# Patient Record
Sex: Female | Born: 1940 | Race: White | Hispanic: No | Marital: Single | Smoking: Never smoker
Health system: Southern US, Community
[De-identification: ages and names within clinical notes are randomized; demographics above are authoritative.]

## PROBLEM LIST (undated history)

## (undated) DIAGNOSIS — F32A Depression, unspecified: Secondary | ICD-10-CM

## (undated) DIAGNOSIS — J189 Pneumonia, unspecified organism: Secondary | ICD-10-CM

## (undated) DIAGNOSIS — K2270 Barrett's esophagus without dysplasia: Secondary | ICD-10-CM

## (undated) DIAGNOSIS — R112 Nausea with vomiting, unspecified: Secondary | ICD-10-CM

## (undated) DIAGNOSIS — D649 Anemia, unspecified: Secondary | ICD-10-CM

## (undated) DIAGNOSIS — Z9889 Other specified postprocedural states: Secondary | ICD-10-CM

## (undated) DIAGNOSIS — I10 Essential (primary) hypertension: Secondary | ICD-10-CM

## (undated) DIAGNOSIS — F329 Major depressive disorder, single episode, unspecified: Secondary | ICD-10-CM

## (undated) DIAGNOSIS — K219 Gastro-esophageal reflux disease without esophagitis: Secondary | ICD-10-CM

## (undated) DIAGNOSIS — Z8719 Personal history of other diseases of the digestive system: Secondary | ICD-10-CM

## (undated) DIAGNOSIS — A419 Sepsis, unspecified organism: Secondary | ICD-10-CM

## (undated) DIAGNOSIS — F419 Anxiety disorder, unspecified: Secondary | ICD-10-CM

## (undated) DIAGNOSIS — E669 Obesity, unspecified: Secondary | ICD-10-CM

## (undated) DIAGNOSIS — H3530 Unspecified macular degeneration: Secondary | ICD-10-CM

## (undated) DIAGNOSIS — J302 Other seasonal allergic rhinitis: Secondary | ICD-10-CM

## (undated) DIAGNOSIS — R519 Headache, unspecified: Secondary | ICD-10-CM

## (undated) DIAGNOSIS — M1990 Unspecified osteoarthritis, unspecified site: Secondary | ICD-10-CM

## (undated) HISTORY — DX: Headache, unspecified: R51.9

## (undated) HISTORY — PX: SHOULDER ARTHROSCOPY: SHX128

## (undated) HISTORY — PX: TONSILLECTOMY: SUR1361

## (undated) HISTORY — PX: APPENDECTOMY: SHX54

## (undated) HISTORY — PX: ABDOMINAL SURGERY: SHX537

## (undated) HISTORY — DX: Unspecified macular degeneration: H35.30

## (undated) HISTORY — PX: PARAESOPHAGEAL HERNIA REPAIR: SHX2161

## (undated) HISTORY — PX: ABDOMINAL HYSTERECTOMY: SHX81

## (undated) HISTORY — PX: CERVICAL FUSION: SHX112

## (undated) HISTORY — PX: BACK SURGERY: SHX140

## (undated) HISTORY — PX: SKIN BIOPSY: SHX1

## (undated) HISTORY — PX: CHOLECYSTECTOMY: SHX55

## (undated) HISTORY — PX: HIATAL HERNIA REPAIR: SHX195

## (undated) HISTORY — DX: Sepsis, unspecified organism: A41.9

## (undated) HISTORY — PX: EYE SURGERY: SHX253

## (undated) HISTORY — PX: MEDIAL PARTIAL KNEE REPLACEMENT: SHX5965

---

## 1977-03-05 HISTORY — PX: GASTRIC BYPASS: SHX52

## 1993-03-05 HISTORY — PX: JOINT REPLACEMENT: SHX530

## 1999-06-21 ENCOUNTER — Ambulatory Visit (HOSPITAL_COMMUNITY): Admission: RE | Admit: 1999-06-21 | Discharge: 1999-06-21 | Payer: Self-pay | Admitting: Neurosurgery

## 1999-07-24 ENCOUNTER — Ambulatory Visit (HOSPITAL_COMMUNITY): Admission: RE | Admit: 1999-07-24 | Discharge: 1999-07-24 | Payer: Self-pay | Admitting: Neurosurgery

## 1999-08-07 ENCOUNTER — Ambulatory Visit (HOSPITAL_COMMUNITY): Admission: RE | Admit: 1999-08-07 | Discharge: 1999-08-07 | Payer: Self-pay | Admitting: Neurosurgery

## 1999-09-27 ENCOUNTER — Inpatient Hospital Stay (HOSPITAL_COMMUNITY): Admission: RE | Admit: 1999-09-27 | Discharge: 1999-09-30 | Payer: Self-pay | Admitting: Neurosurgery

## 2000-07-30 ENCOUNTER — Encounter: Payer: Self-pay | Admitting: Orthopedic Surgery

## 2000-08-05 ENCOUNTER — Inpatient Hospital Stay (HOSPITAL_COMMUNITY): Admission: RE | Admit: 2000-08-05 | Discharge: 2000-08-08 | Payer: Self-pay | Admitting: Orthopedic Surgery

## 2003-01-25 ENCOUNTER — Inpatient Hospital Stay (HOSPITAL_COMMUNITY): Admission: RE | Admit: 2003-01-25 | Discharge: 2003-01-29 | Payer: Self-pay | Admitting: Orthopedic Surgery

## 2003-11-02 ENCOUNTER — Inpatient Hospital Stay (HOSPITAL_BASED_OUTPATIENT_CLINIC_OR_DEPARTMENT_OTHER): Admission: RE | Admit: 2003-11-02 | Discharge: 2003-11-02 | Payer: Self-pay | Admitting: Cardiology

## 2006-03-05 HISTORY — PX: JOINT REPLACEMENT: SHX530

## 2007-06-02 ENCOUNTER — Inpatient Hospital Stay (HOSPITAL_COMMUNITY): Admission: RE | Admit: 2007-06-02 | Discharge: 2007-06-05 | Payer: Self-pay | Admitting: Orthopedic Surgery

## 2009-01-21 ENCOUNTER — Encounter: Admission: RE | Admit: 2009-01-21 | Discharge: 2009-01-21 | Payer: Self-pay | Admitting: Orthopedic Surgery

## 2009-05-16 ENCOUNTER — Encounter: Admission: RE | Admit: 2009-05-16 | Discharge: 2009-05-16 | Payer: Self-pay | Admitting: Neurosurgery

## 2009-11-01 ENCOUNTER — Ambulatory Visit (HOSPITAL_COMMUNITY): Admission: RE | Admit: 2009-11-01 | Discharge: 2009-11-01 | Payer: Self-pay | Admitting: Neurosurgery

## 2009-12-01 ENCOUNTER — Inpatient Hospital Stay (HOSPITAL_COMMUNITY)
Admission: RE | Admit: 2009-12-01 | Discharge: 2009-12-03 | Payer: Self-pay | Source: Home / Self Care | Admitting: Neurosurgery

## 2010-05-18 LAB — CBC
HCT: 43.5 % (ref 36.0–46.0)
Hemoglobin: 14.1 g/dL (ref 12.0–15.0)
MCH: 30.4 pg (ref 26.0–34.0)
MCHC: 32.4 g/dL (ref 30.0–36.0)
MCV: 93.8 fL (ref 78.0–100.0)
Platelets: 202 10*3/uL (ref 150–400)
RBC: 4.64 MIL/uL (ref 3.87–5.11)
RDW: 13.7 % (ref 11.5–15.5)
WBC: 7.9 10*3/uL (ref 4.0–10.5)

## 2010-05-18 LAB — BASIC METABOLIC PANEL
BUN: 21 mg/dL (ref 6–23)
CO2: 27 mEq/L (ref 19–32)
Calcium: 8.2 mg/dL — ABNORMAL LOW (ref 8.4–10.5)
Chloride: 100 mEq/L (ref 96–112)
Creatinine, Ser: 1.24 mg/dL — ABNORMAL HIGH (ref 0.4–1.2)
GFR calc Af Amer: 52 mL/min — ABNORMAL LOW (ref >60)
GFR calc non Af Amer: 43 mL/min — ABNORMAL LOW (ref >60)
Glucose, Bld: 103 mg/dL — ABNORMAL HIGH (ref 70–99)
Potassium: 3.3 mEq/L — ABNORMAL LOW (ref 3.5–5.1)
Sodium: 136 mEq/L (ref 135–145)

## 2010-05-18 LAB — SURGICAL PCR SCREEN
MRSA, PCR: NEGATIVE
Staphylococcus aureus: NEGATIVE

## 2010-07-18 NOTE — Op Note (Signed)
[name], [name] NO.:  [id number]   MEDICAL RECORD NO.:  [id number]          PATIENT TYPE:  INP   LOCATION:  5029                         FACILITY:  [facility]   PHYSICIAN:  Mila Homer. Sherlean Foot, M.D. DATE OF BIRTH:  10-Oct-1940   DATE OF PROCEDURE:  06/02/2007  DATE OF DISCHARGE:                               OPERATIVE REPORT   SURGEON:  Mila Homer. Sherlean Foot, M.D.   ASSISTANT:  Legrand Pitts. Duffy, P.A.   ANESTHESIA:  General.   PREOPERATIVE DIAGNOSIS:  Left hip osteoarthritis.   POSTOPERATIVE DIAGNOSIS:  Left hip osteoarthritis.   PROCEDURE:  Left total hip arthroplasty.   INDICATIONS FOR PROCEDURE:  Patient is a 70 year old white female with  failure of conservative measures for osteoarthritis of the left knee.   DESCRIPTION OF PROCEDURE:  Patient was laid supine, administered general  anesthesia, and placed in the right-down, left-up lateral decubitus  position.  The right hip was prepped and draped in the usual sterile  fashion.  A lateral incision was made with a #10 blade, and this was a  curvilinear incision.  Sharp dissection was continued down to the fascia  lata.  I then placed the trial limb retractor in place after incising  fascia lata.  The cautery was used for dissection and to obtain  hemostasis.  Once hemostasis was obtained, I then incised the anterior  one-half of the vastus lateralis, gluteus medius, and all of the gluteus  minimus.  I tagged these with #2 Tevdek.  I then performed an anterior  hip capsulectomy.  I placed the Hohmann retractor anteriorly and used  the neck guide to mark out a good femoral neck cut.  Once I had done  this, I switched to the back side of the table, PA to the front side.  I  removed the labrum circumferentially, and at this point, reamed up to 54  and put in a 56 mm fiber mesh cup with no holes or no spikes.  I placed  a 7 mm neutral off-set liner to accept a 32 mm head and then switched  back to the back side of the  table.  I then sequentially reamed up to 16  and then broached up to 16 and trialed with various head sizes, chose  5/16 .  I then located up, took it through an aggressive range of motion  and felt the hip was indeed stable.  I then removed the trial  components, copiously irrigated them, then press-fitted in a 5/16 porous-  coated stem, tamped on a clean +35 ball, and relocated the hip.  I then  irrigated, closed the vastus gluteus sleeve through drill holes and  oversewn with #2 Tevdeks.  I then closed the fascia lata with a running  #1 Vicryl, deep soft tissues with buried 0 Vicryls, subcuticular 2-0  Vicryl, and skin staples.   COMPLICATIONS:  None.   DRAINS:  None.           ______________________________  Mila Homer. Sherlean Foot, M.D.     SDL/MEDQ  D:  06/02/2007  T:  06/02/2007  Job:  [id number] 

## 2010-07-21 NOTE — Op Note (Signed)
[place]  Patient:    [name] [name], [name] [name]                        MRN: [id number] Proc. Date: 08/05/00 Adm. Date:  [id number] Attending:  Georgena Spurling                           Operative Report  PREOPERATIVE DIAGNOSIS:  Right hip osteoarthritis.  POSTOPERATIVE DIAGNOSIS:  Right hip osteoarthritis.  OPERATION PERFORMED:  Right total hip replacement.  SURGEON:  Georgena Spurling, M.D.  ASSISTANT:  Jamelle Rushing, P.A.  ANESTHESIA:  General.  INDICATIONS FOR PROCEDURE:  The patient is a 70 year old white female with radiographic and clinical findings of osteoarthritis of the right hip.  After informed consent was obtained, she was taken to the operating room.  DESCRIPTION OF PROCEDURE:  The patient was laid in the supine position and administered general endotracheal anesthesia and Foley catheter was placed. She was then placed in the right up, left down lateral decubitus position. The right hip was prepped and draped in the usual sterile fashion.  A #10 blade was used to make a curvilinear incision, Southern fashion.  Sharp dissection was continued down to the level of the fascia.  Bleeding vessels were cauterized.  The fascia was incised along the length of the incision and held in place with a Charnley retractor.  A complete bursectomy was performed. We then made an incision taking off the anterior one third of the gluteus medius, the gluteus minimus and a sleeve of vastus lateralis to keep it in continuity.  We then went into external rotation and began to elevate this off subperiosteally.  We then removed the anterior hip joint capsule.  We then dislocated the femur with external rotation and flexion.  We then used the cutting guide to choose our neck length and used a sagittal saw to make this cut.  We then brought the leg up onto the table ____________ I used a Hohmann posteriorly to keep the femur out of the way.  A cobra anteriorly to  remove the labrum circumferentially and then sequentially reamed up to 54 mm.  At this point we tamped in a trispiked 56 mm cuff with 2 mm of press-fit fixation.  This was very, very stable.  This was approximately in 35 degrees of inclination and 10 degrees of anteversion.  We then put the leg in flexion and off the table and we used the canal finding reamer and then sequentially reamed up to 15.5 mm and then broached up to a 16.  We then trialed with a 0 ball and felt it was very stable with its neutral liner.  We therefore removed the trial component, irrigated and placed in a real neutral polyethylene highly cross-linked polyethylene component.  We then tamped down to size 16 with 0.5 mm of press-fit fixation holding it in approximately 5 to 10 degrees of anteversion.  We then placed the 0 32 mm head, located the hip, took it through an extreme range of motion and felt it was very stable. We then left a Hemovac deep to the wound.  We closed the sleeve of the gluteus medius and vastus lateralis through drill hole into the lateral greater trochanter.  We then used several interrupted figure-of-eight sutures in that layer as well. We then left our Hemovac deep to the fascial layer and closed our fascia with a series of  interrupted #1 Vicryls.  Deep soft tissues with interrupted 0 Vicryl, a subcuticular 2-0 Vicryl and skin staples.  We dressed the wound with Adaptic, 4 x 4s, ABDs and a sterile Ioban drape.  ESTIMATED BLOOD LOSS:  500 cc.  COMPLICATIONS:  None.  DRAINS:  One Hemovac. DD:  08/05/00 TD:  08/05/00 Job: [id number]

## 2010-07-21 NOTE — H&P (Signed)
[place]. [facility]  Patient:    [name] [name], [name] [name]                        MRN: [id number] Adm. Date:  [id number] Disc. Date: [id number] Attending:  Tressie Stalker D                         History and Physical  CHIEF COMPLAINT:  Right hip and leg pain.  HISTORY OF PRESENT ILLNESS:  The patient is a 70 year old white female who has been having pain in her hip and right leg for approximately a year and one-half.  She has been seen by a chiropractor and treated with some success, but the pain persisted.  She was seen by Dr. Mila Palmer and was diagnosed with arthritis.  She failed to improve and was worked up with a hip CT and a lumbar MRI.  The hip CT was more or less normal.  The lumbar MRI demonstrates spinal stenosis.  The patient complains of right posterior thigh pain which radiates down into her lateral knee region.  She has some back pain but more hip and leg pain than back pain.  She has failed medical management.  PAST MEDICAL HISTORY:  Hypertension, varicose veins, obesity, knee osteoarthritis, hammertoe.  MEDICATIONS PRIOR TO ADMISSION: 1. Maxzide 1/2 p.o. q.d. 2. Premarin 0.625 mg p.o. q.d. 3. Ambient 10 mg p.o. q.d. 4. Darvocet p.r.n. 5. Tagamet 400 mg b.i.d. 6. Detrol 1/2 p.o. q.d. p.r.n.  DRUG ALLERGIES:   PENICILLIN, NOVOCAINE.  PAST SURGICAL HISTORY:  In 1979, vein stripping.  In 1980, stomach stapling. In 1987, hysterectomy.  In 1994, knee arthroscopy.  In 1992, hammertoe.  FAMILY MEDICAL HISTORY:  The patients mother died at age 34 secondary to breast cancer.  Her father died at age 59 secondary to heart problems and a stroke.  She had one brother with a myocardial infarction and another brother with colon cancer and high blood pressure.  SOCIAL HISTORY:  The patient is single.  She lives in [place].  She is a Production designer, theatre/television/film at [employer].  She denies tobacco, ethanol, or drug use.  REVIEW OF SYSTEMS:  Negative except as above.  PHYSICAL  EXAMINATION:  GENERAL:  A pleasant, obese, 70 year old white female complaining of right hip and leg pain.  HEENT:  Normocephalic, atraumatic.   Pupils are equal, round and reactive to light.  Extraocular muscles intact.  Sclerae white.  Conjunctivae pink. Oropharynx benign.  Uvula midline.  NECK:  Supple.  There is no masses, meningismus, deformity, tracheal deviation, jugular venous distension, or carotid bruits.  She has a limited cervical range of motion.  Spurlings test is negative.  Lhermittes sign was not present.  Thorax is symmetric.  LUNGS:  Clear to auscultation.  HEART:  Regular rate and rhythm.  ABDOMEN:  Obese, soft, nontender.  EXTREMITIES:  Obese.  There is no point tenderness or deformities to palpation of her right lower extremity.  BACK:  There is no point tenderness or deformities.  Straight leg raise testing and Faberes testing are equivocal on the right and negative on the left.  NEUROLOGIC:  The patient is alert and oriented x 3.  Cranial nerves II-XII are grossly intact.  Bilateral vision and hearing grossly normal.  Motor strength is 5/5 in bilateral deltoid, biceps, triceps, hand grips, wrist extensors, interosseous psoas, quadriceps, gastrocnemius, extensor hallucis longus. Cerebellar exam is intact to rapid alternating movements of the upper  extremities bilaterally.  Sensory exam is grossly normal to light touch in all tested dermatomes bilaterally.  Deep tendon reflexes are 1 to 2 in bilateral biceps, triceps, brachial radialis, quadriceps, gastrocnemius.  DIAGNOSTIC STUDIES:  The patient had an MRI performed at [facility] on April 19, 1999.  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  She has severe degenerative disk disease at L4-5 with multifactorial spinal stenosis, right greater than left, in the  lateral recesses.  ASSESSMENT/PLAN: L4-5 degenerative disease, spinal stenosis, and spondylosis.  I have discussed the situation with the patient and reviewed her MRI scan, pointing out the abnormalities.  She has failed medical management and, therefore, discussed a bilateral L4 laminotomy foraminotomy versus total laminectomy with her.  I have shown her surgical models and discussed the risks of surgery extensively.  The patient has weighed the risks, benefits, and alternatives to surgery and wishes to proceed with the surgery on September 27, 1999.  History of hypertension, varicose veins, osteoarthritis, etc., noted. DD:  09/27/99 TD:  09/27/99 Job: [id number]

## 2010-07-21 NOTE — Op Note (Signed)
NAME:  [name] [name], [name] [name]                           ACCOUNT NO.:  [id number]   MEDICAL RECORD NO.:  [id number]                   PATIENT TYPE:  INP   LOCATION:  5040                                 FACILITY:  [facility]   PHYSICIAN:  Mila Homer. Sherlean Foot, M.D.              DATE OF BIRTH:  [birth year]   DATE OF PROCEDURE:  01/25/2003  DATE OF DISCHARGE:                                 OPERATIVE REPORT   HISTORY OF PRESENT ILLNESS:  The patient is a 70 year old white female who  has failed conservative measures for osteoarthritis of the knee.  The left  knee was found to have severe osteoarthritis and informed consent was  obtained.   PREOPERATIVE DIAGNOSIS:  Left severe osteoarthritis of the knee.   POSTOPERATIVE DIAGNOSIS:  Left severe osteoarthritis of the knee.   OPERATION PERFORMED:  Left total knee arthroplasty.   SURGEON:  Mila Homer. Sherlean Foot, M.D.   ASSISTANT:  Jamelle Rushing, P.A.   ANESTHESIA:  General.   DESCRIPTION OF PROCEDURE:  The patient was taken to the operating room and  put in supine position and administered general endotracheal anesthesia.  After a sterile prep and drape, a #10 blade was used to make a standard  midline incision.  A new blade was used to make a median parapatellar  arthrotomy.  A synovectomy was performed in the suprapatellar pouch.  We had  a lot of difficulty in getting the patella everted.  I did create a small  pouch for it to be everted into and had to perform somewhat of a lateral  release just to get it to evert.  I then measured at 22 mm thick and used  the 29 reamer to ream down and recreate that thickness with a new prosthetic  trial in place after drilling three lug holes.  I then removed the trial and  went into flexion where I cut the ACL and PCL and delivered the tibia  anteriorly.  I used the extramedullary alignment system to remove a cut  surface of the tibia 2 mm of bone off the medial tibial plateau and  perpendicular to the long axis of  the tibia.  At this point I then turned  attention to the femur.  I made an intramedullary drill hole and used the  intramedullary alignment guide to set up our distal femoral cutting block  and made that distal femoral cut with a sagittal saw in 6 degrees of valgus.  At this point I placed a four in one cutting block on and made anterior,  posterior and chamfer cuts after measuring for a size E.  I then used a  finishing block and drilled with lug holes.  I then turned attention back to  the tibia where I used a size 4 finishing tray with a drill and a keel.  I  then trialed with a size 4 tibia size E femur size  10 poly insert and size  35 patella.  I had excellent patellar tracking, good flexion-extension gap  balancing, so I chose these components.  I then copiously irrigated and  cemented in the tibia first, femur second, snapped in real polyethylene and  located the knee, put it in extension and then cemented in the patella.  At  this point I irrigated copiously.  I then let the tourniquet down.  This was  at 59 minutes.  I then placed a Hemovac deep to the arthrotomy and a pain  catheter superficial to the arthrotomy.  I closed the arthrotomy with  interrupted #1 figure-of-eight Vicryl sutures, deep soft tissues with  interrupted 0 Vicryl sutures, subcuticular 2-0 Vicryl suture and skin  staples.  Dressed with Adaptic, 4 x 4s, sterile Webril and TED stockings.  Tourniquet 59 minutes.   COMPLICATIONS:  None.   DRAINS:  None.   ESTIMATED BLOOD LOSS:  300 mL.                                               Mila Homer. Sherlean Foot, M.D.    SDL/MEDQ  D:  01/25/2003  T:  01/26/2003  Job:  [id number]

## 2010-07-21 NOTE — Discharge Summary (Signed)
NAME:  [name] [name], [name] [name]                           ACCOUNT NO.:  [id number]   MEDICAL RECORD NO.:  [id number]                   PATIENT TYPE:  INP   LOCATION:  5040                                 FACILITY:  [facility]   PHYSICIAN:  Mila Homer. Sherlean Foot, M.D.              DATE OF BIRTH:  March 28, 1940   DATE OF ADMISSION:  01/25/2003  DATE OF DISCHARGE:  01/29/2003                                 DISCHARGE SUMMARY   ADMISSION DIAGNOSES:  1. Left knee osteoarthritis.  2. History of right total hip arthroplasty in 2002.  3. Hypertension.  4. Gastroesophageal reflux disease.  5. History of phlebitis.  6. History of anemia.   DISCHARGE DIAGNOSES:  1. End-stage osteoarthritis left knee status post left total knee     arthroplasty.  2. Acute blood loss anemia secondary to surgery.  3. Mild leukocytosis, now resolved.  4. Hypertension.  5. Gastroesophageal reflux disease.  6. History of right total hip arthroplasty 2002.  7. History of phlebitis.  8. History of anemia.   SURGICAL PROCEDURE:  On January 25, 2003 [name] [name] underwent a left total  knee arthroplasty by Dr. Mila Homer. Lucey, assisted by Oneida Alar, P.A.-C.  She had a NexGen Complete all poly patella standard size 35 mm diameter; 9  mm thickness with a NexGen Legacy knee LPS femoral component size E left; a  NexGen Legacy knee LPS articular surface size striped yellow EF 10 mm  height; and a  NexGen fluted stem tibial component size 4.   COMPLICATIONS:  None.   CONSULTS:  1. Physical therapy and rehab medicine consult January 26, 2003.  2. Occupational therapy consult January 27, 2003.  3. Case management consult January 29, 2003.   HISTORY OF PRESENT ILLNESS:  This 70 year old white female patient presented  to Dr. Sherlean Foot with a long history of left knee pain.  The pain is a dull  aching sensation without radiation.  It seems to be worse with damp and  cold, rainy conditions and she complains of locking and giving-way in her  knee.  She has failed conservative treatment and because of that she is  presenting for a left knee replacement.   HOSPITAL COURSE:  [name] [name] tolerated her surgical procedure well and  without immediate postoperative complications.  She was subsequently  transferred to 5000.  Postoperative day #1 Tmax 99.7, vital signs were  stable.  Hemoglobin 9.4, hematocrit 28.1.  Leg was neurovascularly intact.  She was started on therapy per protocol and Foley was discontinued later  that day.   Postoperative day #2 Tmax 101.2, vital signs still stable.  Hemoglobin 9.1,  hematocrit 27.3.  Left knee incision well approximated with staples.  She  was switched to p.o. pain medications, aggressive pulmonary toilet for her  fever, and she did complain of some calf pain at that time so bilateral  lower extremity Dopplers were ordered to rule out DVT.  On postoperative day #3 she was feeling better.  Pain was controlled with  medications.  White count was 12, hemoglobin 9.3, hematocrit 27.4.  Doppler  had been negative for DVT.  Temperature and white count were monitored and  she was kept one more day.  On November 26 she was afebrile, wound was  benign.  It was felt she was stable for discharge home and she was  discharged home later that day.   DISCHARGE INSTRUCTIONS:   DIET:  She may resume her prehospitalization diet.   MEDICATIONS:  She may resume her prehospitalization medications except no  Darvocet while taking other pain medications.  These include:  1. Trazodone one-half tablet at bedtime.  2. Zoloft 50 mg one-half tablet daily.  3. Maxzide one-half tablet p.o. daily.  4. Nexium 40 mg p.o. daily.  5. Multivitamin one p.o. q.h.s.   Additional medications include:  1. Lovenox 40 mg subcu daily with the last dose February 08, 2003.  2. OxyContin 10 mg two tablets p.o. q.12h. for seven days then one tablet     p.o. q.12h. for seven days then discontinue, #42 with no refill.  3. Percocet  5/325 mg one to two tablets p.o. q.4h. p.r.n. for pain, #50 with     no refill.   ACTIVITY:  She can be out of bed, weightbearing as tolerated on the left leg  with use of the walker.  Please see the blue total knee discharge sheet for  further discharge instructions.   WOUND CARE:  She may shower after no drainage from the wound for two days.  Please see the blue total knee discharge sheet for further discharge  instructions.   FOLLOW-UP:  She needs to follow up with Dr. Sherlean Foot in our office in  approximately 10-12 days and needs to call [phone] for that appointment.  She is arranged for home CPM, PT through [facility].   LABORATORY DATA:  Bilateral lower extremity venous Doppler done January 27, 2003 showed no obvious evidence of DVT, superficial thrombosis, or Bakers  cyst bilaterally.  This was a difficulty study due to body habitus.   On November 23, hemoglobin 9.4, hematocrit 28.1.  On November 25, white  count 12, hemoglobin 9.3, hematocrit 27.4, and platelets 262.   On November 16, sodium 134, glucose 111.  On November 23, sodium 135,  potassium 3.3, glucose 125, calcium 7.8.  On November 24, sodium 133,  potassium 3.0, glucose 135, BUN 5, creatinine 0.7, calcium 7.8.  On November  25, glucose 132, calcium 8.1.  Alk phos was 154 on November 16.  All other  laboratory studies were within normal limits.      Legrand Pitts Duffy, P.A.                      Mila Homer. Sherlean Foot, M.D.    KED/MEDQ  D:  03/04/2003  T:  03/04/2003  Job:  [id number]   cc:   Selinda Flavin  [address]  [place]  [place] [address]  Fax: [phone]

## 2010-07-21 NOTE — Discharge Summary (Signed)
[place]. [facility]  Patient:    [name] [name], [name] [name]                        MRN: [id number] Adm. Date:  [id number] Disc. Date: [id number] Attending:  Tressie Stalker D                           Discharge Summary  For full details of this admission please refer to typed history and physical.  BRIEF HISTORY:  The patient is a 70 year old white female who has suffered from hip and leg pain and has failed medical management.  She was worked up as an outpatient with a lumbar MRI which demonstrated spinal stenosis of L4-5.  I discussed the various treatment options with her including surgery.  The patient weighed the risks, benefits and alternatives to surgery and decided to proceed with lumbar decompression.  For past medical history, past surgical history and medications prior to admission, drug allergies, family medical history, social history and admission physical exam, imaging ______ assessment, plan, etc. please refer to typed history and physical.  HOSPITAL COURSE:  I admitted the patient to [facility] on September 27, 1999 with a diagnosis of L4-5 spinal stenosis, degenerative disease, spondylosis and lumbar radiculopathy.  On the day of admission I performed a bilateral L4 laminotomy/foraminotomy using microdissection.  The surgery went well without complications (for full details for this operation please refer to typed operative note).  POSTOPERATIVE COURSE:  The patients postoperative course was essentially unremarkable.  She was somewhat slow to ambulate but by postop date #3 i.e. September 30, 1999 she was afebrile and vital signs were stable.  She was eating well and ambulating well.  Her wound was healing well without signs of infection and she requested discharge to home.  She normal motor strength and arrangements had been made for her to get a home hospital bed.  She was therefore discharged home on September 30, 1999.   DISCHARGE INSTRUCTIONS:  The  patient was given written discharge instructions and instructed to followup with me in three weeks and resume her outpatient medical regimen.  DISCHARGE MEDICATIONS: 1. Percocet 5, #60, 1-2 p.o. q.4h. p.r.n. for pain, limit 8 per day, no    refills. 2. Valium 5 mg, #40, 1 p.o. q.6h. p.r.n. for muscle spasms and 1 refill.  FINAL DIAGNOSES:  L4-5 spinal stenosis, degenerative disk disease, spondylosis and lumbar radiculopathy.  PROCEDURE PERFORMED:  Bilateral L4 laminotomy/foraminotomy using microdissection. DD:  09/30/99 TD:  10/02/99 Job: [id number]

## 2010-07-21 NOTE — H&P (Signed)
[place]  Patient:    [name] [name], [name] [name]                       MRN: [id number] Adm. Date:  08/05/00 Attending:  Georgena Spurling, M.D. Dictator:   Jamelle Rushing, P.A.                         History and Physical  DATE OF BIRTH:  07/11/1940  CHIEF COMPLAINT:  Right hip pain off and on for the last three to four years.  HISTORY OF PRESENT ILLNESS:  The patient is a 70 year old white female with a history of on and off hip pain for the past three to four years.  The patient states that she has had multiple falls on the right side starting in July 1999, and has been having progressively worsening right hip pain ever since. The patient describes her hip as giving out at times.  The pain is severe when she first gets up in the morning and then improves slightly but then will progressively worsen as the day goes on with ambulation.  The patient states she has a sensation like her hip wants to lock up when she is changing from the sitting to standing position.  The pain is located over the lateral hip around to the lateral thigh.  It is described as 6-7/10 as a pressure and burning sensation.  She is not currently using an assistive device for ambulation.  ALLERGIES:  NOVOCAINE.  PENICILLIN.  CODEINE "UPSETS STOMACH."  The patient did use Xylocaine with a Jul 18, 2000, ENT visit and examination.  CURRENT MEDICATIONS: 1. Premarin 0.625 mg p.o. q.d. 2. Nexium 40 mg p.o. q.d. 3. Maxzide 75/50 mg 1/2 tablet p.o. q.d. 4. Ambien 10 mg p.o. q.h.s. p.r.n. 5. Zoloft 50 mg p.o. q.d. 6. Darvocet p.r.n.  PAST MEDICAL HISTORY:  The patient had been diagnosed with hypertension many years ago and has been stable on the current medications.  The patient has also been diagnosed with a hiatal hernia.  The patient also has been evaluated and treated for significant reflux in the past.  Otherwise, the patient denies any history of diabetes, thyroid disease,  peptic ulcer, heart disease, or asthma.  PAST SURGICAL HISTORY: 1. In 1979, vein stripping. 2. In 1980, stomach stapling. 3. In 1983, hysterectomy. 4. In 1994, left knee arthroscopy. 5. In 1995, hammertoe repair. 6. In 2001, pinched nerves in her lumbar spine. 7. The patient states she has had problems during her 1994 surgical procedure    with anesthesia with significant problems with nausea and roaring in her    ears.  SOCIAL HISTORY:  The patient is a moderately obese 70 year old white female with no history of smoking or alcohol use.  The patient is single, no children.  She lives in a [place] house, two steps to the main entrance. She is a Youth worker, and she is currently employed with the [employer].  PRIMARY M.D.:  Dr. Selinda Flavin, in [place], [place].  FAMILY HISTORY:  Mother is deceased from breast cancer.  Father is deceased from cardiac disease.  The patient has one brother deceased from cardiac disease.  The patient has two brothers alive, one of them with recent diagnosis of Parkinsons disease and a history of colon cancer.  The patient has one sister alive, in good medical health.  REVIEW OF SYSTEMS:  Positive for fever and  GI flu about three weeks ago, with no residual effects.  The patient does have occasional posterior cervical spine discomfort, mostly during working hours.  The patient does have a 29-pound weight loss with a diet since August of this last year.  The patient does use glasses at all times.  The patient does have a problem with reflux for which Nexium works well and diet control.  Otherwise, the review of systems is negative for any other sensory, respiratory, cardiac, GI, GU, hematologic, musculoskeletal, neurologic, or mental status problems at this time.  PHYSICAL EXAMINATION:  VITAL SIGNS:  Weight is 240 pounds.  Height is 5 feet 5 inches.  Pulse is 72, respirations 12, temperature is 98.6, blood pressure is  142/88.  GENERAL:  Moderately obese white female who ambulates without any use of assistive device without any significant limps.  She looks generally well-developed and healthy.  She is able to get on and off the exam table without any difficulty.  HEENT:  Head is normocephalic, atraumatic.  Nontender maxillary and frontal sinuses.  Pupils are equal, round, and reactive.  Sclerae is nonicteric. Conjunctivae is pink and moist.  External ears without deformities.  Canals patent.  TMs pearly gray and intact.  Oropharynx intact.  Nasal septum is midline.  Mucous membranes pink and moist.  No polyps noted.  Oral buccal mucosa was pink and moist, without lesions.  Oral dentition is in fair repair. Uvula midline, with symmetrical phonations.  The patient is able to swallow without difficulty.  NECK:  Supple.  No palpable lymphadenopathy.  Thyroid gland was nontender. The patient had good range of motion of her cervical spine without any difficulty.  CHEST:  Lung sounds clear and equal bilaterally.  No wheezes, rales, rhonchi, or rubs noted.  HEART:  Regular rate and rhythm.  S1, S2 auscultated.  No murmurs, rubs, or gallops noted.  ABDOMEN:  Round, obese, soft, nontender throughout with deep palpation.  Bowel sounds were present throughout.  Unable to palpate any organomegaly due to obesity.  CVA was nontender to percussion.  EXTREMITIES:  Upper extremities were symmetrical in size and shape, with moderate amount of proximal extremity obesity.  The patient had good range of motion of her shoulders, elbows, and wrists, without any difficulty.  Motor strength was 5/5.  Lower extremities:  Right hip had full extension, flexion back to 100 degrees, 10 degrees internal and external rotation, and 10 degrees abduction, and 0 degrees adduction, all limited by mechanical and pain.  Left hip had full extension, 120 degrees flexion, limited by soft tissue.  She had 20-30 degrees internal and  external rotation without any difficulty, with about 20 degrees abduction and adduction without any difficulty.  Bilateral  knees were symmetrical, with full extension and flexion back to about 120 degrees.  No valgus or varus laxity.  No anterior or posterior drawer. Calves were nontender.  Bilateral ankles were symmetrical in size and shape, with good dorsal and plantar flexion.  Peripheral vascular:  Carotid pulses were 2+, no bruits.  Radial pulses 2+, unable to palpate femoral pulses. Dorsalis pedis pulses were 1+, as were posterior tibia.  No lower extremity edema.  The patient did have significant varicosities throughout bilateral lower extremities.  NEUROLOGIC:  Conscious, alert, and appropriate, having easy conversation with the examiner.  Cranial nerves were grossly intact.  Deep tendon reflexes of the upper and lower extremities were symmetrical right to left and intact. The patient was grossly intact to light touch sensation from head  to toe.  BREAST, RECTAL, GENITOURINARY:  Deferred at this time.  IMPRESSION: 1. End-stage osteoarthritis right hip. 2. Obesity. 3. Hypertension. 4. Hiatal hernia. 5. Reflux. 6. Significant lower extremity varicosities.  PLAN:  The patient will be admitted to [facility] on August 05, 2000, under the care of Dr. Georgena Spurling.  The patient will undergo a right total hip arthroplasty after undergoing routine laboratories and tests. DD:  07/30/00 TD:  07/30/00 Job: [id number]

## 2010-07-21 NOTE — H&P (Signed)
NAME:  [name] [name], [name] [name]                           ACCOUNT NO.:  [id number]   MEDICAL RECORD NO.:  [id number]                   PATIENT TYPE:  INP   LOCATION:  NA                                   FACILITY:  [facility]   PHYSICIAN:  Mila Homer. Sherlean Foot, M.D.              DATE OF BIRTH:  [birth year]   DATE OF ADMISSION:  01/25/2003  DATE OF DISCHARGE:                                HISTORY & PHYSICAL   CHIEF COMPLAINT:  Left knee pain.   HISTORY OF PRESENT ILLNESS:  [name] [name] is a 70 year old white female with  several decades of left knee pain.  She states her left knee has been  hurting her since the 80s, and she has sustained multiple falls onto the  knee.  Her knee pain is worse with damp, cold, and rainy conditions.  Pain  is described as a dull ache with no radiation to other parts of her left  lower extremity.  Mechanical symptoms include locking and giving away  sensation.  She is not able to fall asleep most nights due to the pain.  She  uses a cane to ambulate.  Rest, Darvocet, and Tylenol help with her pain.  She has undergone Cortisone injections in the past and these have given her  mild relief.  The patient also has undergone a left knee arthroscopy with  partial medial meniscectomy, chondroplasty of the medial patellar  compartments, this was done on June 18, 2001.   Incidentally, the patient also has had a right total hip replacement that  was done on August 05, 2000, by Dr. Sherlean Foot.  The patient's right hip is doing  very well.   ALLERGIES:  1. NOVOCAINE which causes nausea.  2. PENICILLIN which causes nausea.  3. MORPHINE causes hallucinations.   MEDICATIONS:  1. Trazodone 1/2 tablet at bedtime.  2. Zoloft 50 mg 1/2 tab daily.  3. Maxzide 1/2 tab daily.  4. Darvocet 1-2 tablets q.6 h. p.r.n. pain.  5. Nexium 40 mg one daily.  6. Multivitamin one at bedtime.   PAST MEDICAL HISTORY:  1. Hypertension.  2. GERD.  3. History of phlebitis.  4. History of anemia.   PAST  SURGICAL HISTORY:  1. D&C.  2. Vein stripped in her leg in 1979, vein stripping in 1980.  3. Hysterectomy.  4. Arthroscopy in 1992.  5. Surgical repair of a hammer toe in 1995.  6. Cervical impingement in 1999.  7. Arthroscopy, left knee, 2002, and hip replacement, 2002, both performed     by Dr. Sherlean Foot.   The patient's only complication with any of the above procedures was a  hematoma of the vocal cords in the past that has left her worsened with a  very weak voice for approximately four months.  She feels that she received  a blood transfusion with her D&C back in 1960.   SOCIAL HISTORY:  Denies any tobacco or  alcohol use.  She is single, has no  children.  Her primary care physician is Selinda Flavin in [place], [place].  She  lives in a one story home with two steps to the usual entrance.  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  She is currently employed.   FAMILY HISTORY:  Mother deceased at age 29, due to breast cancer and  metastases to the lungs.  Father is deceased at age 35 due to heart disease,  also had a history of hypertension and strokes.  The patient has one  deceased brother from cardiac disease.  Otherwise, family history is  remarkable for colon cancer, hypercholesterolemia, hypertension, and  Parkinson's disease.   REVIEW OF SYSTEMS:  Positive for a sore throat one week ago; however, this  is much improved.  Cervical discomfort with a history of cervical  impingement requiring surgery in 1999 by Dr. Lovell Sheehan.  She wears glasses.  Has dyspnea with exertion, mostly when climbing stairs.  Denies any chest  pain, PND, or orthopnea.  She has gastroesophageal reflux.  Nocturia x1.  Otherwise, review of systems is negative.   PHYSICAL EXAMINATION:  GENERAL:  The patient is a well-developed, well-  nourished, overweight female who walks using a cane and has an antalgic gait  on the left.  The patient's mood and affect are  appropriate.  She talks  easily with the examiner.  VITAL SIGNS:  Height 5 feet 7 inches.  Weight 237 pounds.  Temperature is  97.1, pulse 76, respiratory rate 18, blood pressure is 142/100.  CARDIAC:  Regular rate and rhythm, no murmurs, rubs, or gallops noted.  LUNGS:  Clear to auscultation bilaterally.  ABDOMEN:  Round, obese, soft, and nontender throughout with deep palpation.  Bowel sounds are heard throughout the abdomen.  NECK:  Supple.  Carotid pulses are 2+ bilaterally without bruits.  No  cervical tenderness.  She has full range of motion of the cervical spine.  BACK:  Palpation of the thoracic and lumbar spines reveals upper thoracic  pain to palpation.  The lumbar spine is nontender.  HEENT:  Normocephalic, atraumatic.  Nontender maxillary and frontal sinuses  to palpation.  Pupils equal, round, and reactive.  Sclerae are nonicteric  bilaterally.  Conjunctivae are pink and moist bilaterally.  External ears  are without deformities.  TMs are pearly and gray bilaterally.  Nasal septum  is midline.  Mucous membranes are pink and moist.  There are no polyps noted  in either nare.  Oral mucosa was pink and moist without lesions.  Oral  dentition is fair with multiple repairs.  Uvula is midline.  BREASTS/RECTAL/GENITOURINARY:  Deferred at this time.  NEUROLOGICAL:  The patient is alert and oriented x3.  Cranial nerves II-XII  are grossly intact.  Deep tendon reflexes are 2+ bilaterally in the upper  and lower extremities.  Strength testing in the upper and lower extremities  reveals 5/5 strength against resistance.  MUSCULOSKELETAL:  Upper extremities full range of motion except with forward  flexion of the shoulders, and bilaterally she is 160 degrees.  Abduction is  180 degrees.  Radial pulses are 2+ bilaterally, otherwise, the upper  extremities are neurovascularly intact.  Lower extremities:  Full range of  motion of the hips.  Right knee 0 to 120, no tenderness with palpation  along the joint line.  McMurray's is negative.  Varus/valgus stressing reveals no  laxity or pain.  Left knee:  0-110 degrees  of flexion, force flexion is  painful.  Tenderness with palpation in the medial compartment.  Valgus and  varus stressing reveals no laxity.  McMurray's is negative.  Lower  extremities:  Nonedematous.  Dorsal pedal pulses are 2+ bilaterally.  EHL  and FHL are intact.   RADIOLOGICAL DATA:  X-rays of the left knee taken in the office today,  January 19, 2003, reveal medial compartment bone-on-bone with osteophytes  throughout the knee.   IMPRESSION:  1. 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  2. Status post right total hip replacement in 2002.  3. Hypertension.  4. Gastroesophageal reflux disease.  5. History of phlebitis.  6. History of anemia.   PLAN:  The patient will be admitted to [facility] on January 25, 2003, and undergo a left total knee replacement.      Kirtland Bouchard, P.A.-C                 Mila Homer Sherlean Foot, M.D.    GWC/MEDQ  D:  01/19/2003  T:  01/19/2003  Job:  [id number]

## 2010-07-21 NOTE — Cardiovascular Report (Signed)
NAME:  NEKESHIA, LENHARDT                           ACCOUNT NO.:  [id number]   MEDICAL RECORD NO.:  [id number]                   PATIENT TYPE:  OIB   LOCATION:  6501                                 FACILITY:  [facility]   PHYSICIAN:  Arturo Morton. Riley Kill, M.D. [facility]         DATE OF BIRTH:  01-05-1941   DATE OF PROCEDURE:  11/02/2003  DATE OF DISCHARGE:                              CARDIAC CATHETERIZATION   PROCEDURES:  1.  Left heart catheterization.  2.  Selective coronary arteriography.  3.  Selective left ventriculography.   CARDIOLOGIST:  Arturo Morton. Riley Kill, M.D.   INDICATIONS:  Ms. Dike is a 70 year old female who presents with some  epigastric discomfort.  She has had prior cholecystectomy.  She was noted to  have a low potassium and this was replaced in the catheterization laboratory  today.  Repeat was 3.4.  She is on Maxzide.  Current study was done to  assess coronary anatomy after a Cardiolite suggested inferior ischemia.   DESCRIPTION OF PROCEDURE:  The procedure was performed from the right  femoral artery using 6 French catheters.  The patient tolerated the  procedure well.  There were no complications.  We did use a 5 curved  catheter to better inject the left coronary system.   The patient was taken to the holding area in satisfactory clinical  condition.   HEMODYNAMIC DATA:  1.  Central aortic pressure was 127/63 with a mean of 88.  2.  Left ventricular pressure was 119/20.  3.  There was no obvious gradient on pullback across the aortic valve.   ANGIOGRAPHIC DATA:  1.  Ventriculography was performed in the RAO projection and overall      systolic function appeared to be well preserved.  No definite segmental      abnormalities or contractions were identified.  2.  The left main coronary artery is a fairly large caliber vessel that      appears to be free of significant disease.  3.  The left anterior descending artery courses to the apex.  One major      diagonal  branch is noted.  The left anterior descending and its branches      appear to be free of critical disease.  4.  The circumflex consists of basically two marginal branches and a      posterior lateral branch.  There is some streaming noted proximally but      no real high grade areas of stenosis.  The first marginal, second      marginal and third marginal all appear to be smooth and intact.  5.  The right coronary artery consists of a posterior descending and      posterior lateral branch, both of which are relatively small.  No high      grade areas of focal stenosis are noted.   CONCLUSION:  1.  Well preserved left ventricular function.  2.  Fairly normal appearing coronary arteries without significant high grade      focal obstruction.   PLAN:  1.  Replace potassium.  2.  Follow up with Dr. Diona Browner regarding further evaluation.  3.  Follow up with Dr. Dimas Aguas.                                               Arturo Morton. Riley Kill, M.D. [facility]    TDS/MEDQ  D:  11/02/2003  T:  11/03/2003  Job:  [id number]

## 2010-07-21 NOTE — Op Note (Signed)
[place]. [facility]  Patient:    [name] [name], [name] [name]                        MRN: [id number] Proc. Date: 09/27/99 Adm. Date:  [id number] Attending:  Tressie Stalker D                           Operative Report  BRIEF HISTORY:  The patient is a 70 year old white female, who has suffered from hip and leg pain, which has failed medical management.  She was worked up as an outpatient with a lumbar MRI.  It demonstrated spinal stenosis at L4-5. I discussed the various treatment options with her, including surgery. Patient failed medical management and therefore, weighed the risks, benefits and alternatives to surgery and decided to proceed with a lumbar laminectomy.  PREOPERATIVE DIAGNOSES:  L4-5 spinal stenosis, degenerative disk disease, spondylosis, lumbar radiculopathy.  POSTOPERATIVE DIAGNOSES:  L4-5 spinal stenosis, degenerative disk disease, spondylosis, lumbar radiculopathy.  PROCEDURE:  Bilateral L4 laminotomy/foraminotomy using microdissection.  SURGEON:  Cristi Loron, M.D.  ASSISTANT:  Stefani Dama, M.D.  ANESTHESIA:  General endotracheal.  ESTIMATED BLOOD LOSS:  Less than 100 cc.  SPECIMENS:  None.  COMPLICATIONS:  None.  DRAINS:  None.  DESCRIPTION OF PROCEDURE:  The patient was brought to the operating room by the anesthesia team, general endotracheal anesthesia was induced.  The patient was turned to the prone position on the Wilson frame.  Her lumbosacral region was then prepared with Betadine scrub and Betadine solution.  Sterile drapes were applied.  I then injected the area to be incised with Marcaine with epinephrine solution.  I used a scalpel to make a vertical incision over the patients midline L4-5 interspace.  I used electrocautery to dissect down through the abundant adipose tissue to the thoracolumbar fascia.  I then divided the fascia bilaterally, performing a bilateral subperiosteal dissection, stripping the  paraspinous musculature from the spinous processes of L4 and L5.  I inserted the McCullough retractor for exposure.  I obtained an intraoperative radiograph to confirm my location.  I then brought the operating microscope into the field and under instrument magnification and illumination, I completed the decompression.  I used the Midas Rex high-speed drill to perform a right L4 laminotomy, i.e., I drilled in a cephalad direction until I encountered the insertion of ligamentum flavum.  I then widened the laminotomy with the Kerrison punch removing the L4-5 ligamentum flavum.  I identified the exiting L5 nerve root and performed a foraminotomy about it.  I used the angled curets to remove some excess ligament from a lateral recess.  Of note, there was quite a bit of facet arthropathy, which seemed to be the problem on this side.  I reached up in a cephalad direction and I palpated about the exiting L4 nerve root.  At this point, the right L4 and L5 nerve roots and thecal sac were well-decompressed.  I then repeated this procedure on the left side, i.e., I performed a left L4 laminotomy with the high-speed drill, widened the laminotomy with the Kerrison punch, removed the left L4-5 ligamentum flavum, performed a foraminotomy about the left L5 nerve root and used the angled curets and removed some hypertrophied ligament from the lateral recesses.  At this point, the bilateral thecal sac and L4 and L5 nerve roots appeared well-decompressed. I then copiously irrigated the wound with bacitracin solution and removed the  solution, achieved stringent hemostasis with bipolar electrocautery, removed the McCullough retractor and then reapproximated the patients thoracolumbar fascia with interrupted #1 Vicryl, the subcutaneous tissue with interrupted 2-0 Vicryl, the skin with Steri-Strips and benzoin.  The wound was then coated with bacitracin ointment and sterile dressing applied.  The drapes were  removed and the patient was returned to the supine position, where she was extubated by the anesthesia team and transported to the post anesthesia care unit in stable condition. All sponge, instrument and needle counts were correct at the end of this case. D:  09/27/99 TD:  09/28/99 Job: [id number]

## 2010-07-21 NOTE — Discharge Summary (Signed)
[facility]  Patient:    [name] [name], [name] [name]                        MRN: [id number] Adm. Date:  [id number] Disc. Date: [id number] Attending:  Georgena Spurling Dictator:   Jamelle Rushing, P.A.-C.                           Discharge Summary  ADMISSION DIAGNOSES: 1. End-stage osteoarthritis, right hip. 2. Obesity. 3. Hypertension. 4. Hiatal hernia. 5. Reflux. 6. Significant lower extremity varicosities.  DISCHARGE DIAGNOSES: 1. Right total hip arthroplasty. 2. Hypertension. 3. Obesity. 4. Hiatal hernia. 5. Reflex. 6. Significant lower extremity varicosities.  HISTORY OF PRESENT ILLNESS:  The patient is a 70 year old white female with the complaint of right hip pain off and one for the last three years.  The patient has had several falls, landing on her right hip.  The pain has progressively worsened over time.  The hip has the sensation of giving out. The pain is a 7/10.  It is described as a pressure burning sensation over the lateral aspect of the hip down into the thigh.  The pain is worse in the morning with initiation of ambulation from the sitting to standing position and long periods of ambulation.  The patient does not use an assistive device.  ALLERGIES:  NOVOCAINE, PENICILLIN, CODEINE, but the patient has used Xylocaine with an ENT evaluation on Jul 18, 2000, without any difficulties.  CURRENT MEDICATIONS: 1. Premarin 0.0625 mg p.o. q.d. 2. Nexium 40 mg p.o. q.d. 3. Maxzide 75/50 one-half tablet p.o. q.d. 4. Ambien 10 mg p.o. q.d. 5. Zoloft 50 mg p.o. q.d. 6. Darvocet p.r.n.  SURGICAL PROCEDURE:  On August 05, 2000, the patient was taken to the OR by Georgena Spurling, M.D., assisted by Jamelle Rushing, P.A.  Under general anesthesia, the patient underwent a right total hip replacement.  The patient tolerated the procedure well.  There were no complications.  Estimated blood loss was 500 cc, and one Hemovac drain was left in  place.  CONSULTATIONS:  On August 05, 2000, the following routine consults were requested: Physical therapy, occupational therapy, rehabilitation, and care management.  HOSPITAL COURSE:  On August 05, 2000, the patient was admitted to [facility] under the care of Georgena Spurling, M.D.  The patient was taken to the OR where a right total hip arthroplasty was performed.  The patient tolerated the procedure well and was transferred to the recovery room in good condition.  The patient was transferred from the recovery room to the orthopedic floor where she had a three-day postoperative course in which she had some slight hypokalemia.  Initially it was 2.8 on postop day #1 but did improve significantly with IV and p.o. to 3.1 by postop day #2.  The patient progressed very nicely with physical therapy.  She was motor and neurovascularly intact, and she had no further problems during her hospitalization.  In fact, the patient was ready for discharge on postop day #3 and was discharged after physical therapy in good condition.  LABORATORY DATA:  CBC on June 5 showed WBC 10.7, hemoglobin 9.2, hematocrit 28.1, platelets 238.  Routine chemistries on June 5 showed sodium 138, potassium 3.1, glucose 138, BUN 5, creatinine 0.7.  Routine urinalysis on admission was normal.  MEDICATIONS ON DISCHARGE FROM ORTHOPEDIC FLOOR:  1. Colace 100 mg p.o. b.i.d.  2. Lovenox 30  mg subcutaneously q.12h.  3. Zoloft 50 mg p.o. q.d.  4. Protonix 80 mg p.o. q.d.  5. Premarin 0.625 mg p.o. q.d.  6. Maxzide 1 tablet p.o. q.d.  7. Potassium chloride 20 mEq p.o. b.i.d.  8. Percocet 1 or 2 tablets every 4 to 6 hours p.r.n. pain.  9. Tylenol 650 mg p.o. q.4-6h. p.r.n. temperature. 10. Restoril 15 to 30 mg p.o. q.h.s. p.r.n.  DISCHARGE INSTRUCTIONS:  DISCHARGE MEDICATIONS:  The patient is to resume routine home medications.  1. OxyContin CR 20 mg 1 tablet every 12 hours.  2. Percocet 1 or 2 tablets every 4 to 6  hours for pain if needed.  3. Lovenox 40 mg injection, 1 injection a day for the next 10 days.  ACTIVITY:  As tolerated with use of a walker.  No driving.  DIET:  No restrictions.  WOUND CARE:  Keep wound clean and dry.  May shower.  Check daily for infection.  SPECIAL INSTRUCTIONS:  Home health physical therapy by Juliane Lack.  FOLLOWUP:  The patient is to call [phone] for followup appointment with Dr. Sherlean Foot in 10 days.  DISPOSITION:  The patients condition on discharge to home is listed as good. DD:  08/31/00 TD:  08/31/00 Job: [id number]

## 2010-07-21 NOTE — Discharge Summary (Signed)
[name] [name], [name] [name] NO.:  [id number]   MEDICAL RECORD NO.:  [id number]          PATIENT TYPE:  INP   LOCATION:  5029                         FACILITY:  [facility]   PHYSICIAN:  Mila Homer. Sherlean Foot, M.D. DATE OF BIRTH:  12-02-1940   DATE OF ADMISSION:  06/02/2007  DATE OF DISCHARGE:  06/05/2007                               DISCHARGE SUMMARY   FINAL DIAGNOSIS:  Left hip osteoarthritis.   PROCEDURE WHILE IN HOSPITAL:  Left total hip arthroplasty.   DISCHARGE SUMMARY:  The patient is a 70 year old female admitted for  continued left hip pain, that has failed conservative treatment.  She  has not had significant relief from oral medications, injections, and  will therefore like to proceed with left total hip arthroplasty.  Right  hip pain has fairly worsened over the last year and she understands the  risks and benefits of surgery.   PAST MEDICAL HISTORY:  The patient's medical history is significant for:  1. Sleep apnea.  2. Anemia.  3. Hypertension.   PAST SURGICAL HISTORY:  Include D&Cs, vein stripping, stomach stapling,  hysterectomy, back surgeries, arthroscopic procedure on the knee with a  total knee replacement, also underwent a right total hip replacement in  1990s, left shoulder scope, and skin grafts on the [facility]rectal [facility]   ALLERGIES:  The patient has allergies to:  1. PENICILLIN which causes a rash.  2. NOVOCAIN which caused her to pass out while at the dentist.  3. CODEINE which causes nausea.   CURRENT MEDICATIONS:  At the time of admission include:  Darvocet,  Zoloft, Trazodone, Maxzide, Prilosec, Mucinex, Extra Strength Tylenol,  and Sudafed.   SOCIAL HISTORY:  The patient does not use tobacco or alcohol.   FAMILY HISTORY:  Significant for heart disease, hypertension, CVA, and  cancer.   PHYSICAL EXAMINATION:  VITAL SIGNS:  The patient's temperature is 99.4,  pulse 88, respirations 12, and blood pressure  130/86.  HEAD:  Normocephalic and atraumatic.  EYES:  Pupils are equal, round, and reactive to light, and  accommodation.  NOSE:  Patent.  THROAT:  Midline.  EARS:  TMs are clear.  NECK:  Supple.  Full range of motion.  CHEST:  Clear to auscultation and percussion.  HEART:  Regular rate and rhythm.  No murmurs.  ABDOMEN:  Soft and nontender.  Bowel sounds are 2+.  EXTREMITIES:  The patient is neurovascularly intact.  SKIN:  Within normal limits.  MUSCULOSKELETAL:  Left hip range of motion shows pain to internal and  external rotation.  Decreased range of motion with minimal tenderness  with palpation.   LABORATORY DATA:  Preoperative labs including CBC, CMET, chest x-ray,  EKG, PT, and PTT were all within normal limits with the exception of  potassium at 3.2, glucose of 119 and an ALP of 149.   HOSPITAL COURSE:  On the day of admission, the patient underwent a left  total hip arthroplasty by Dr. Georgena Spurling.  The patient was placed on  perioperative antibiotics.  Since, then she was placed on  postoperative  Lovenox prophylaxis for DVT prevention.  She was placed on Dilaudid for  pain control as well as began with physical therapy on the first  postoperative day.  Postoperative day 1, the patient was resting  comfortably.  The pain was 4-5/10.  No nausea or vomiting.  Foley had  clear urine.  Hemoglobin 8.9, WBC of 9.8, and T-max 99.1.  All vitals  were stable.  The patient's dressing was intact.  No drainage.  The  patient was neurovascularly intact distally and was otherwise  progressing well with treatment postoperatively from a total hip.  Postoperative day 2, the patient had decreased appetite but otherwise  stable.  Hemoglobin 9.0, T-max 101, and pulse 106.  She was otherwise  alert and oriented.  Thigh was soft.  Wound was intact with some minimal  drainage.  She was experiencing some tachycardia, otherwise  neurovascularly intact.  Foley was discontinued.  PCA was  discontinued.  One unit packed red blood cells was given secondary to postoperative  blood loss anemia and since spirometer was encouraged and physical  therapy was continued.  Postoperative day 3, the patient was improving  with physical therapy, she was voiding without difficulty.  Hemoglobin  increased to 9.1 after transfusion.  T-max 99.3, still elevated pulse at  104, otherwise alert and orient.  Wound was clean and dry.  She had not  yet had a bowel movement and was progressed to Fleet or Dulcolax.  She  had successful bowel movement.  In the past, her physical therapy goals  were safe and independent transfers and ambulation using total hip  precautions and was discharged home under the care of her family.   DISCHARGE MEDICATIONS:  1. Maxzide 75/50 one p.o. daily.  2. Darvocet stopped.  3. Zoloft 50 mg one p.o. daily.  4. Trazodone 50 mg 1 nightly.  5. Prilosec 20 mg 1 p.o. b.i.d.  6. Mucinex as needed.  7. Tylenol was discontinued.  8. Sudafed.  9. She was also given prescriptions for: Percocet, Robaxin, and      Lovenox.   ACTIVITIES:  Weightbearing as tolerated with total hip precautions.  Dressing changes every other day or as needed.  She should return to see  Dr. Sherlean Foot in 2 weeks' time for follow up.  She should call in for an  appointment or come in back sooner should she have any increase in  temperature greater than 101, drainage from the wound, or hip pain not  well-controlled by oral pain medications.   DIAGNOSIS FOR THIS ADMISSION:  Osteoarthritis, end stage of the left  hip.   PROCEDURE WHILE IN HOSPITAL:  Left total hip arthroplasty.      Laural Benes. Jannet Mantis.    ______________________________  Mila Homer Sherlean Foot, M.D.    Merita Norton  D:  07/02/2007  T:  07/03/2007  Job:  [id number]

## 2010-11-27 LAB — COMPREHENSIVE METABOLIC PANEL
ALT: 16
AST: 18
Albumin: 4
Alkaline Phosphatase: 149 — ABNORMAL HIGH
BUN: 15
CO2: 30
Calcium: 8.8
Chloride: 97
Creatinine, Ser: 0.93
GFR calc Af Amer: >60
GFR calc non Af Amer: >60
Glucose, Bld: 119 — ABNORMAL HIGH
Potassium: 3.2 — ABNORMAL LOW
Sodium: 137
Total Bilirubin: 0.4
Total Protein: 7.4

## 2010-11-27 LAB — PROTIME-INR
INR: 1
Prothrombin Time: 13.6

## 2010-11-27 LAB — CBC
HCT: 27.5 — ABNORMAL LOW
HCT: 37.1
Hemoglobin: 12.2
Hemoglobin: 8.9 — ABNORMAL LOW
MCHC: 32.5
MCHC: 32.9
MCV: 79.7
MCV: 79.7
Platelets: 214
Platelets: 336
RBC: 3.45 — ABNORMAL LOW
RBC: 4.65
RDW: 15.7 — ABNORMAL HIGH
RDW: 15.9 — ABNORMAL HIGH
WBC: 8.6
WBC: 9.8

## 2010-11-27 LAB — URINALYSIS, ROUTINE W REFLEX MICROSCOPIC
Bilirubin Urine: NEGATIVE
Glucose, UA: NEGATIVE
Hgb urine dipstick: NEGATIVE
Ketones, ur: NEGATIVE
Nitrite: NEGATIVE
Protein, ur: NEGATIVE
Specific Gravity, Urine: 1.019
Urobilinogen, UA: 1
pH: 8

## 2010-11-27 LAB — DIFFERENTIAL
Basophils Absolute: 0
Basophils Relative: 0
Eosinophils Absolute: 0.2
Eosinophils Relative: 2
Lymphocytes Relative: 24
Lymphs Abs: 2.1
Monocytes Absolute: 0.9
Monocytes Relative: 10
Neutro Abs: 5.5
Neutrophils Relative %: 64

## 2010-11-27 LAB — CROSSMATCH
ABO/RH(D): A POS
Antibody Screen: NEGATIVE

## 2010-11-27 LAB — URINE CULTURE: Colony Count: 3000

## 2010-11-27 LAB — BASIC METABOLIC PANEL
BUN: 9
CO2: 31
Calcium: 8 — ABNORMAL LOW
Chloride: 98
Creatinine, Ser: 0.81
GFR calc Af Amer: >60
GFR calc non Af Amer: >60
Glucose, Bld: 115 — ABNORMAL HIGH
Potassium: 3.5
Sodium: 138

## 2010-11-27 LAB — URINE MICROSCOPIC-ADD ON

## 2010-11-27 LAB — APTT: aPTT: 32

## 2010-11-27 LAB — ABO/RH: ABO/RH(D): A POS

## 2010-11-28 LAB — BASIC METABOLIC PANEL
BUN: 5 — ABNORMAL LOW
BUN: 7
CO2: 28
CO2: 30
Calcium: 8 — ABNORMAL LOW
Calcium: 8.1 — ABNORMAL LOW
Chloride: 96
Chloride: 99
Creatinine, Ser: 0.77
Creatinine, Ser: 0.8
GFR calc Af Amer: >60
GFR calc Af Amer: >60
GFR calc non Af Amer: >60
GFR calc non Af Amer: >60
Glucose, Bld: 121 — ABNORMAL HIGH
Glucose, Bld: 138 — ABNORMAL HIGH
Potassium: 3.2 — ABNORMAL LOW
Potassium: 3.6
Sodium: 134 — ABNORMAL LOW
Sodium: 135

## 2010-11-28 LAB — CBC
HCT: 27.8 — ABNORMAL LOW
HCT: 27.8 — ABNORMAL LOW
Hemoglobin: 9 — ABNORMAL LOW
Hemoglobin: 9.1 — ABNORMAL LOW
MCHC: 32.4
MCHC: 32.7
MCV: 79.7
MCV: 80.8
Platelets: 216
Platelets: 221
RBC: 3.44 — ABNORMAL LOW
RBC: 3.49 — ABNORMAL LOW
RDW: 16.1 — ABNORMAL HIGH
RDW: 16.1 — ABNORMAL HIGH
WBC: 11.5 — ABNORMAL HIGH
WBC: 12.6 — ABNORMAL HIGH

## 2011-03-15 DIAGNOSIS — M25569 Pain in unspecified knee: Secondary | ICD-10-CM | POA: Diagnosis not present

## 2011-03-15 DIAGNOSIS — M25519 Pain in unspecified shoulder: Secondary | ICD-10-CM | POA: Diagnosis not present

## 2011-03-15 DIAGNOSIS — M67919 Unspecified disorder of synovium and tendon, unspecified shoulder: Secondary | ICD-10-CM | POA: Diagnosis not present

## 2011-03-22 DIAGNOSIS — E119 Type 2 diabetes mellitus without complications: Secondary | ICD-10-CM | POA: Diagnosis not present

## 2011-03-22 DIAGNOSIS — K219 Gastro-esophageal reflux disease without esophagitis: Secondary | ICD-10-CM | POA: Diagnosis not present

## 2011-03-22 DIAGNOSIS — E876 Hypokalemia: Secondary | ICD-10-CM | POA: Diagnosis not present

## 2011-03-22 DIAGNOSIS — M129 Arthropathy, unspecified: Secondary | ICD-10-CM | POA: Diagnosis not present

## 2011-03-22 DIAGNOSIS — I10 Essential (primary) hypertension: Secondary | ICD-10-CM | POA: Diagnosis not present

## 2011-03-29 DIAGNOSIS — K219 Gastro-esophageal reflux disease without esophagitis: Secondary | ICD-10-CM | POA: Diagnosis not present

## 2011-03-29 DIAGNOSIS — M129 Arthropathy, unspecified: Secondary | ICD-10-CM | POA: Diagnosis not present

## 2011-03-29 DIAGNOSIS — F329 Major depressive disorder, single episode, unspecified: Secondary | ICD-10-CM | POA: Diagnosis not present

## 2011-03-29 DIAGNOSIS — G4700 Insomnia, unspecified: Secondary | ICD-10-CM | POA: Diagnosis not present

## 2011-03-29 DIAGNOSIS — I10 Essential (primary) hypertension: Secondary | ICD-10-CM | POA: Diagnosis not present

## 2011-05-08 DIAGNOSIS — M545 Low back pain, unspecified: Secondary | ICD-10-CM | POA: Diagnosis not present

## 2011-05-24 DIAGNOSIS — M545 Low back pain, unspecified: Secondary | ICD-10-CM | POA: Diagnosis not present

## 2011-05-24 DIAGNOSIS — IMO0001 Reserved for inherently not codable concepts without codable children: Secondary | ICD-10-CM | POA: Diagnosis not present

## 2011-06-04 DIAGNOSIS — M545 Low back pain, unspecified: Secondary | ICD-10-CM | POA: Diagnosis not present

## 2011-06-04 DIAGNOSIS — Z96659 Presence of unspecified artificial knee joint: Secondary | ICD-10-CM | POA: Diagnosis not present

## 2011-06-04 DIAGNOSIS — Z96649 Presence of unspecified artificial hip joint: Secondary | ICD-10-CM | POA: Diagnosis not present

## 2011-06-04 DIAGNOSIS — IMO0001 Reserved for inherently not codable concepts without codable children: Secondary | ICD-10-CM | POA: Diagnosis not present

## 2011-06-06 DIAGNOSIS — M545 Low back pain, unspecified: Secondary | ICD-10-CM | POA: Diagnosis not present

## 2011-06-06 DIAGNOSIS — Z96649 Presence of unspecified artificial hip joint: Secondary | ICD-10-CM | POA: Diagnosis not present

## 2011-06-06 DIAGNOSIS — Z96659 Presence of unspecified artificial knee joint: Secondary | ICD-10-CM | POA: Diagnosis not present

## 2011-06-06 DIAGNOSIS — IMO0001 Reserved for inherently not codable concepts without codable children: Secondary | ICD-10-CM | POA: Diagnosis not present

## 2011-06-08 DIAGNOSIS — Z96659 Presence of unspecified artificial knee joint: Secondary | ICD-10-CM | POA: Diagnosis not present

## 2011-06-08 DIAGNOSIS — M545 Low back pain, unspecified: Secondary | ICD-10-CM | POA: Diagnosis not present

## 2011-06-08 DIAGNOSIS — IMO0001 Reserved for inherently not codable concepts without codable children: Secondary | ICD-10-CM | POA: Diagnosis not present

## 2011-06-08 DIAGNOSIS — Z96649 Presence of unspecified artificial hip joint: Secondary | ICD-10-CM | POA: Diagnosis not present

## 2011-06-11 DIAGNOSIS — IMO0001 Reserved for inherently not codable concepts without codable children: Secondary | ICD-10-CM | POA: Diagnosis not present

## 2011-06-11 DIAGNOSIS — M545 Low back pain, unspecified: Secondary | ICD-10-CM | POA: Diagnosis not present

## 2011-06-11 DIAGNOSIS — Z96649 Presence of unspecified artificial hip joint: Secondary | ICD-10-CM | POA: Diagnosis not present

## 2011-06-11 DIAGNOSIS — Z96659 Presence of unspecified artificial knee joint: Secondary | ICD-10-CM | POA: Diagnosis not present

## 2011-06-13 DIAGNOSIS — L821 Other seborrheic keratosis: Secondary | ICD-10-CM | POA: Diagnosis not present

## 2011-06-13 DIAGNOSIS — L259 Unspecified contact dermatitis, unspecified cause: Secondary | ICD-10-CM | POA: Diagnosis not present

## 2011-06-13 DIAGNOSIS — L820 Inflamed seborrheic keratosis: Secondary | ICD-10-CM | POA: Diagnosis not present

## 2011-06-13 DIAGNOSIS — D485 Neoplasm of uncertain behavior of skin: Secondary | ICD-10-CM | POA: Diagnosis not present

## 2011-06-18 DIAGNOSIS — M545 Low back pain, unspecified: Secondary | ICD-10-CM | POA: Diagnosis not present

## 2011-06-18 DIAGNOSIS — IMO0001 Reserved for inherently not codable concepts without codable children: Secondary | ICD-10-CM | POA: Diagnosis not present

## 2011-06-18 DIAGNOSIS — Z96659 Presence of unspecified artificial knee joint: Secondary | ICD-10-CM | POA: Diagnosis not present

## 2011-06-18 DIAGNOSIS — Z96649 Presence of unspecified artificial hip joint: Secondary | ICD-10-CM | POA: Diagnosis not present

## 2011-06-20 DIAGNOSIS — M545 Low back pain, unspecified: Secondary | ICD-10-CM | POA: Diagnosis not present

## 2011-06-20 DIAGNOSIS — Z96659 Presence of unspecified artificial knee joint: Secondary | ICD-10-CM | POA: Diagnosis not present

## 2011-06-20 DIAGNOSIS — IMO0001 Reserved for inherently not codable concepts without codable children: Secondary | ICD-10-CM | POA: Diagnosis not present

## 2011-06-20 DIAGNOSIS — Z96649 Presence of unspecified artificial hip joint: Secondary | ICD-10-CM | POA: Diagnosis not present

## 2011-06-22 DIAGNOSIS — Z96649 Presence of unspecified artificial hip joint: Secondary | ICD-10-CM | POA: Diagnosis not present

## 2011-06-22 DIAGNOSIS — IMO0001 Reserved for inherently not codable concepts without codable children: Secondary | ICD-10-CM | POA: Diagnosis not present

## 2011-06-22 DIAGNOSIS — Z96659 Presence of unspecified artificial knee joint: Secondary | ICD-10-CM | POA: Diagnosis not present

## 2011-06-22 DIAGNOSIS — M545 Low back pain, unspecified: Secondary | ICD-10-CM | POA: Diagnosis not present

## 2011-07-05 DIAGNOSIS — C4441 Basal cell carcinoma of skin of scalp and neck: Secondary | ICD-10-CM | POA: Diagnosis not present

## 2011-07-11 DIAGNOSIS — M1710 Unilateral primary osteoarthritis, unspecified knee: Secondary | ICD-10-CM | POA: Diagnosis not present

## 2011-07-11 DIAGNOSIS — M67919 Unspecified disorder of synovium and tendon, unspecified shoulder: Secondary | ICD-10-CM | POA: Diagnosis not present

## 2011-09-11 DIAGNOSIS — I10 Essential (primary) hypertension: Secondary | ICD-10-CM | POA: Diagnosis not present

## 2011-09-11 DIAGNOSIS — E538 Deficiency of other specified B group vitamins: Secondary | ICD-10-CM | POA: Diagnosis not present

## 2011-09-17 DIAGNOSIS — K219 Gastro-esophageal reflux disease without esophagitis: Secondary | ICD-10-CM | POA: Diagnosis not present

## 2011-09-17 DIAGNOSIS — F329 Major depressive disorder, single episode, unspecified: Secondary | ICD-10-CM | POA: Diagnosis not present

## 2011-09-17 DIAGNOSIS — M129 Arthropathy, unspecified: Secondary | ICD-10-CM | POA: Diagnosis not present

## 2011-09-17 DIAGNOSIS — G4700 Insomnia, unspecified: Secondary | ICD-10-CM | POA: Diagnosis not present

## 2011-09-17 DIAGNOSIS — I10 Essential (primary) hypertension: Secondary | ICD-10-CM | POA: Diagnosis not present

## 2011-10-08 DIAGNOSIS — R404 Transient alteration of awareness: Secondary | ICD-10-CM | POA: Diagnosis not present

## 2011-10-08 DIAGNOSIS — R109 Unspecified abdominal pain: Secondary | ICD-10-CM | POA: Diagnosis not present

## 2011-10-08 DIAGNOSIS — Z79899 Other long term (current) drug therapy: Secondary | ICD-10-CM | POA: Diagnosis not present

## 2011-10-08 DIAGNOSIS — K5289 Other specified noninfective gastroenteritis and colitis: Secondary | ICD-10-CM | POA: Diagnosis not present

## 2011-10-08 DIAGNOSIS — E860 Dehydration: Secondary | ICD-10-CM | POA: Diagnosis not present

## 2011-10-08 DIAGNOSIS — Z882 Allergy status to sulfonamides status: Secondary | ICD-10-CM | POA: Diagnosis not present

## 2011-10-08 DIAGNOSIS — Z96659 Presence of unspecified artificial knee joint: Secondary | ICD-10-CM | POA: Diagnosis not present

## 2011-10-08 DIAGNOSIS — Z96619 Presence of unspecified artificial shoulder joint: Secondary | ICD-10-CM | POA: Diagnosis not present

## 2011-10-08 DIAGNOSIS — E876 Hypokalemia: Secondary | ICD-10-CM | POA: Diagnosis present

## 2011-10-08 DIAGNOSIS — F411 Generalized anxiety disorder: Secondary | ICD-10-CM | POA: Diagnosis not present

## 2011-10-08 DIAGNOSIS — Z880 Allergy status to penicillin: Secondary | ICD-10-CM | POA: Diagnosis not present

## 2011-10-08 DIAGNOSIS — K219 Gastro-esophageal reflux disease without esophagitis: Secondary | ICD-10-CM | POA: Diagnosis not present

## 2011-10-08 DIAGNOSIS — Z96649 Presence of unspecified artificial hip joint: Secondary | ICD-10-CM | POA: Diagnosis not present

## 2011-10-08 DIAGNOSIS — J13 Pneumonia due to Streptococcus pneumoniae: Secondary | ICD-10-CM | POA: Diagnosis not present

## 2011-10-08 DIAGNOSIS — J189 Pneumonia, unspecified organism: Secondary | ICD-10-CM | POA: Diagnosis not present

## 2011-10-08 DIAGNOSIS — I10 Essential (primary) hypertension: Secondary | ICD-10-CM | POA: Diagnosis not present

## 2011-10-08 DIAGNOSIS — F329 Major depressive disorder, single episode, unspecified: Secondary | ICD-10-CM | POA: Diagnosis present

## 2011-10-08 DIAGNOSIS — R197 Diarrhea, unspecified: Secondary | ICD-10-CM | POA: Diagnosis not present

## 2011-10-08 DIAGNOSIS — Z888 Allergy status to other drugs, medicaments and biological substances status: Secondary | ICD-10-CM | POA: Diagnosis not present

## 2011-10-08 DIAGNOSIS — Z881 Allergy status to other antibiotic agents status: Secondary | ICD-10-CM | POA: Diagnosis not present

## 2011-10-14 DIAGNOSIS — F329 Major depressive disorder, single episode, unspecified: Secondary | ICD-10-CM | POA: Diagnosis not present

## 2011-10-14 DIAGNOSIS — K219 Gastro-esophageal reflux disease without esophagitis: Secondary | ICD-10-CM | POA: Diagnosis not present

## 2011-10-14 DIAGNOSIS — M6281 Muscle weakness (generalized): Secondary | ICD-10-CM | POA: Diagnosis not present

## 2011-10-14 DIAGNOSIS — F411 Generalized anxiety disorder: Secondary | ICD-10-CM | POA: Diagnosis not present

## 2011-10-14 DIAGNOSIS — J189 Pneumonia, unspecified organism: Secondary | ICD-10-CM | POA: Diagnosis not present

## 2011-10-14 DIAGNOSIS — R269 Unspecified abnormalities of gait and mobility: Secondary | ICD-10-CM | POA: Diagnosis not present

## 2011-10-17 DIAGNOSIS — M6281 Muscle weakness (generalized): Secondary | ICD-10-CM | POA: Diagnosis not present

## 2011-10-17 DIAGNOSIS — F329 Major depressive disorder, single episode, unspecified: Secondary | ICD-10-CM | POA: Diagnosis not present

## 2011-10-17 DIAGNOSIS — F411 Generalized anxiety disorder: Secondary | ICD-10-CM | POA: Diagnosis not present

## 2011-10-17 DIAGNOSIS — J189 Pneumonia, unspecified organism: Secondary | ICD-10-CM | POA: Diagnosis not present

## 2011-10-17 DIAGNOSIS — R269 Unspecified abnormalities of gait and mobility: Secondary | ICD-10-CM | POA: Diagnosis not present

## 2011-10-17 DIAGNOSIS — K219 Gastro-esophageal reflux disease without esophagitis: Secondary | ICD-10-CM | POA: Diagnosis not present

## 2011-10-19 DIAGNOSIS — R269 Unspecified abnormalities of gait and mobility: Secondary | ICD-10-CM | POA: Diagnosis not present

## 2011-10-19 DIAGNOSIS — K219 Gastro-esophageal reflux disease without esophagitis: Secondary | ICD-10-CM | POA: Diagnosis not present

## 2011-10-19 DIAGNOSIS — J189 Pneumonia, unspecified organism: Secondary | ICD-10-CM | POA: Diagnosis not present

## 2011-10-19 DIAGNOSIS — F329 Major depressive disorder, single episode, unspecified: Secondary | ICD-10-CM | POA: Diagnosis not present

## 2011-10-19 DIAGNOSIS — M6281 Muscle weakness (generalized): Secondary | ICD-10-CM | POA: Diagnosis not present

## 2011-10-19 DIAGNOSIS — F411 Generalized anxiety disorder: Secondary | ICD-10-CM | POA: Diagnosis not present

## 2011-10-22 DIAGNOSIS — F329 Major depressive disorder, single episode, unspecified: Secondary | ICD-10-CM | POA: Diagnosis not present

## 2011-10-22 DIAGNOSIS — K219 Gastro-esophageal reflux disease without esophagitis: Secondary | ICD-10-CM | POA: Diagnosis not present

## 2011-10-22 DIAGNOSIS — F411 Generalized anxiety disorder: Secondary | ICD-10-CM | POA: Diagnosis not present

## 2011-10-22 DIAGNOSIS — R269 Unspecified abnormalities of gait and mobility: Secondary | ICD-10-CM | POA: Diagnosis not present

## 2011-10-22 DIAGNOSIS — M6281 Muscle weakness (generalized): Secondary | ICD-10-CM | POA: Diagnosis not present

## 2011-10-22 DIAGNOSIS — J189 Pneumonia, unspecified organism: Secondary | ICD-10-CM | POA: Diagnosis not present

## 2011-10-23 DIAGNOSIS — K219 Gastro-esophageal reflux disease without esophagitis: Secondary | ICD-10-CM | POA: Diagnosis not present

## 2011-10-23 DIAGNOSIS — F411 Generalized anxiety disorder: Secondary | ICD-10-CM | POA: Diagnosis not present

## 2011-10-23 DIAGNOSIS — M6281 Muscle weakness (generalized): Secondary | ICD-10-CM | POA: Diagnosis not present

## 2011-10-23 DIAGNOSIS — R269 Unspecified abnormalities of gait and mobility: Secondary | ICD-10-CM | POA: Diagnosis not present

## 2011-10-23 DIAGNOSIS — J189 Pneumonia, unspecified organism: Secondary | ICD-10-CM | POA: Diagnosis not present

## 2011-10-23 DIAGNOSIS — F329 Major depressive disorder, single episode, unspecified: Secondary | ICD-10-CM | POA: Diagnosis not present

## 2011-10-25 DIAGNOSIS — K219 Gastro-esophageal reflux disease without esophagitis: Secondary | ICD-10-CM | POA: Diagnosis not present

## 2011-10-25 DIAGNOSIS — J189 Pneumonia, unspecified organism: Secondary | ICD-10-CM | POA: Diagnosis not present

## 2011-10-25 DIAGNOSIS — F411 Generalized anxiety disorder: Secondary | ICD-10-CM | POA: Diagnosis not present

## 2011-10-25 DIAGNOSIS — F329 Major depressive disorder, single episode, unspecified: Secondary | ICD-10-CM | POA: Diagnosis not present

## 2011-10-25 DIAGNOSIS — M6281 Muscle weakness (generalized): Secondary | ICD-10-CM | POA: Diagnosis not present

## 2011-10-25 DIAGNOSIS — R269 Unspecified abnormalities of gait and mobility: Secondary | ICD-10-CM | POA: Diagnosis not present

## 2011-10-26 DIAGNOSIS — R269 Unspecified abnormalities of gait and mobility: Secondary | ICD-10-CM | POA: Diagnosis not present

## 2011-10-26 DIAGNOSIS — F329 Major depressive disorder, single episode, unspecified: Secondary | ICD-10-CM | POA: Diagnosis not present

## 2011-10-26 DIAGNOSIS — J189 Pneumonia, unspecified organism: Secondary | ICD-10-CM | POA: Diagnosis not present

## 2011-10-26 DIAGNOSIS — K219 Gastro-esophageal reflux disease without esophagitis: Secondary | ICD-10-CM | POA: Diagnosis not present

## 2011-10-26 DIAGNOSIS — M6281 Muscle weakness (generalized): Secondary | ICD-10-CM | POA: Diagnosis not present

## 2011-10-26 DIAGNOSIS — F411 Generalized anxiety disorder: Secondary | ICD-10-CM | POA: Diagnosis not present

## 2011-10-29 DIAGNOSIS — F329 Major depressive disorder, single episode, unspecified: Secondary | ICD-10-CM | POA: Diagnosis not present

## 2011-10-29 DIAGNOSIS — J189 Pneumonia, unspecified organism: Secondary | ICD-10-CM | POA: Diagnosis not present

## 2011-10-29 DIAGNOSIS — M6281 Muscle weakness (generalized): Secondary | ICD-10-CM | POA: Diagnosis not present

## 2011-10-29 DIAGNOSIS — R269 Unspecified abnormalities of gait and mobility: Secondary | ICD-10-CM | POA: Diagnosis not present

## 2011-10-29 DIAGNOSIS — K219 Gastro-esophageal reflux disease without esophagitis: Secondary | ICD-10-CM | POA: Diagnosis not present

## 2011-10-29 DIAGNOSIS — F411 Generalized anxiety disorder: Secondary | ICD-10-CM | POA: Diagnosis not present

## 2011-10-30 DIAGNOSIS — M6281 Muscle weakness (generalized): Secondary | ICD-10-CM | POA: Diagnosis not present

## 2011-10-30 DIAGNOSIS — J189 Pneumonia, unspecified organism: Secondary | ICD-10-CM | POA: Diagnosis not present

## 2011-10-30 DIAGNOSIS — F411 Generalized anxiety disorder: Secondary | ICD-10-CM | POA: Diagnosis not present

## 2011-10-30 DIAGNOSIS — R269 Unspecified abnormalities of gait and mobility: Secondary | ICD-10-CM | POA: Diagnosis not present

## 2011-10-30 DIAGNOSIS — K219 Gastro-esophageal reflux disease without esophagitis: Secondary | ICD-10-CM | POA: Diagnosis not present

## 2011-10-30 DIAGNOSIS — F329 Major depressive disorder, single episode, unspecified: Secondary | ICD-10-CM | POA: Diagnosis not present

## 2011-10-31 DIAGNOSIS — R269 Unspecified abnormalities of gait and mobility: Secondary | ICD-10-CM | POA: Diagnosis not present

## 2011-10-31 DIAGNOSIS — F329 Major depressive disorder, single episode, unspecified: Secondary | ICD-10-CM | POA: Diagnosis not present

## 2011-10-31 DIAGNOSIS — K219 Gastro-esophageal reflux disease without esophagitis: Secondary | ICD-10-CM | POA: Diagnosis not present

## 2011-10-31 DIAGNOSIS — M6281 Muscle weakness (generalized): Secondary | ICD-10-CM | POA: Diagnosis not present

## 2011-10-31 DIAGNOSIS — F411 Generalized anxiety disorder: Secondary | ICD-10-CM | POA: Diagnosis not present

## 2011-10-31 DIAGNOSIS — J189 Pneumonia, unspecified organism: Secondary | ICD-10-CM | POA: Diagnosis not present

## 2011-11-01 DIAGNOSIS — M719 Bursopathy, unspecified: Secondary | ICD-10-CM | POA: Diagnosis not present

## 2011-11-01 DIAGNOSIS — M6281 Muscle weakness (generalized): Secondary | ICD-10-CM | POA: Diagnosis not present

## 2011-11-01 DIAGNOSIS — J189 Pneumonia, unspecified organism: Secondary | ICD-10-CM | POA: Diagnosis not present

## 2011-11-01 DIAGNOSIS — R269 Unspecified abnormalities of gait and mobility: Secondary | ICD-10-CM | POA: Diagnosis not present

## 2011-11-01 DIAGNOSIS — F411 Generalized anxiety disorder: Secondary | ICD-10-CM | POA: Diagnosis not present

## 2011-11-01 DIAGNOSIS — M67919 Unspecified disorder of synovium and tendon, unspecified shoulder: Secondary | ICD-10-CM | POA: Diagnosis not present

## 2011-11-01 DIAGNOSIS — F329 Major depressive disorder, single episode, unspecified: Secondary | ICD-10-CM | POA: Diagnosis not present

## 2011-11-01 DIAGNOSIS — K219 Gastro-esophageal reflux disease without esophagitis: Secondary | ICD-10-CM | POA: Diagnosis not present

## 2011-11-06 DIAGNOSIS — Z6841 Body Mass Index (BMI) 40.0 and over, adult: Secondary | ICD-10-CM | POA: Diagnosis not present

## 2011-11-06 DIAGNOSIS — E876 Hypokalemia: Secondary | ICD-10-CM | POA: Diagnosis not present

## 2011-11-06 DIAGNOSIS — R079 Chest pain, unspecified: Secondary | ICD-10-CM

## 2011-11-06 DIAGNOSIS — Z79899 Other long term (current) drug therapy: Secondary | ICD-10-CM | POA: Diagnosis not present

## 2011-11-06 DIAGNOSIS — Z881 Allergy status to other antibiotic agents status: Secondary | ICD-10-CM | POA: Diagnosis not present

## 2011-11-06 DIAGNOSIS — Z8701 Personal history of pneumonia (recurrent): Secondary | ICD-10-CM | POA: Diagnosis not present

## 2011-11-06 DIAGNOSIS — R0902 Hypoxemia: Secondary | ICD-10-CM | POA: Diagnosis not present

## 2011-11-06 DIAGNOSIS — I10 Essential (primary) hypertension: Secondary | ICD-10-CM | POA: Diagnosis present

## 2011-11-06 DIAGNOSIS — R918 Other nonspecific abnormal finding of lung field: Secondary | ICD-10-CM | POA: Diagnosis not present

## 2011-11-06 DIAGNOSIS — Z91040 Latex allergy status: Secondary | ICD-10-CM | POA: Diagnosis not present

## 2011-11-06 DIAGNOSIS — Z880 Allergy status to penicillin: Secondary | ICD-10-CM | POA: Diagnosis not present

## 2011-11-06 DIAGNOSIS — D649 Anemia, unspecified: Secondary | ICD-10-CM | POA: Diagnosis present

## 2011-11-06 DIAGNOSIS — N179 Acute kidney failure, unspecified: Secondary | ICD-10-CM | POA: Diagnosis not present

## 2011-11-06 DIAGNOSIS — Z882 Allergy status to sulfonamides status: Secondary | ICD-10-CM | POA: Diagnosis not present

## 2011-11-06 DIAGNOSIS — F329 Major depressive disorder, single episode, unspecified: Secondary | ICD-10-CM | POA: Diagnosis present

## 2011-11-06 DIAGNOSIS — J189 Pneumonia, unspecified organism: Secondary | ICD-10-CM | POA: Diagnosis not present

## 2011-11-06 DIAGNOSIS — Z800 Family history of malignant neoplasm of digestive organs: Secondary | ICD-10-CM | POA: Diagnosis not present

## 2011-11-06 DIAGNOSIS — E669 Obesity, unspecified: Secondary | ICD-10-CM | POA: Diagnosis not present

## 2011-11-06 DIAGNOSIS — Z888 Allergy status to other drugs, medicaments and biological substances status: Secondary | ICD-10-CM | POA: Diagnosis not present

## 2011-11-06 DIAGNOSIS — K219 Gastro-esophageal reflux disease without esophagitis: Secondary | ICD-10-CM | POA: Diagnosis present

## 2011-11-11 DIAGNOSIS — F411 Generalized anxiety disorder: Secondary | ICD-10-CM | POA: Diagnosis not present

## 2011-11-11 DIAGNOSIS — K219 Gastro-esophageal reflux disease without esophagitis: Secondary | ICD-10-CM | POA: Diagnosis not present

## 2011-11-11 DIAGNOSIS — R269 Unspecified abnormalities of gait and mobility: Secondary | ICD-10-CM | POA: Diagnosis not present

## 2011-11-11 DIAGNOSIS — F329 Major depressive disorder, single episode, unspecified: Secondary | ICD-10-CM | POA: Diagnosis not present

## 2011-11-11 DIAGNOSIS — M6281 Muscle weakness (generalized): Secondary | ICD-10-CM | POA: Diagnosis not present

## 2011-11-11 DIAGNOSIS — J189 Pneumonia, unspecified organism: Secondary | ICD-10-CM | POA: Diagnosis not present

## 2011-11-13 ENCOUNTER — Other Ambulatory Visit (HOSPITAL_COMMUNITY): Payer: Self-pay | Admitting: Neurosurgery

## 2011-11-13 ENCOUNTER — Other Ambulatory Visit: Payer: Self-pay | Admitting: Neurosurgery

## 2011-11-13 DIAGNOSIS — M545 Low back pain, unspecified: Secondary | ICD-10-CM

## 2011-11-13 DIAGNOSIS — M4712 Other spondylosis with myelopathy, cervical region: Secondary | ICD-10-CM | POA: Diagnosis not present

## 2011-11-14 DIAGNOSIS — J180 Bronchopneumonia, unspecified organism: Secondary | ICD-10-CM | POA: Diagnosis not present

## 2011-11-14 DIAGNOSIS — Z79899 Other long term (current) drug therapy: Secondary | ICD-10-CM | POA: Diagnosis not present

## 2011-11-15 DIAGNOSIS — F329 Major depressive disorder, single episode, unspecified: Secondary | ICD-10-CM | POA: Diagnosis not present

## 2011-11-15 DIAGNOSIS — F411 Generalized anxiety disorder: Secondary | ICD-10-CM | POA: Diagnosis not present

## 2011-11-15 DIAGNOSIS — J189 Pneumonia, unspecified organism: Secondary | ICD-10-CM | POA: Diagnosis not present

## 2011-11-15 DIAGNOSIS — M6281 Muscle weakness (generalized): Secondary | ICD-10-CM | POA: Diagnosis not present

## 2011-11-15 DIAGNOSIS — R269 Unspecified abnormalities of gait and mobility: Secondary | ICD-10-CM | POA: Diagnosis not present

## 2011-11-15 DIAGNOSIS — K219 Gastro-esophageal reflux disease without esophagitis: Secondary | ICD-10-CM | POA: Diagnosis not present

## 2011-11-20 ENCOUNTER — Encounter (HOSPITAL_COMMUNITY): Payer: Self-pay | Admitting: Pharmacy Technician

## 2011-11-20 DIAGNOSIS — F329 Major depressive disorder, single episode, unspecified: Secondary | ICD-10-CM | POA: Diagnosis not present

## 2011-11-20 DIAGNOSIS — K219 Gastro-esophageal reflux disease without esophagitis: Secondary | ICD-10-CM | POA: Diagnosis not present

## 2011-11-20 DIAGNOSIS — R269 Unspecified abnormalities of gait and mobility: Secondary | ICD-10-CM | POA: Diagnosis not present

## 2011-11-20 DIAGNOSIS — M6281 Muscle weakness (generalized): Secondary | ICD-10-CM | POA: Diagnosis not present

## 2011-11-20 DIAGNOSIS — J189 Pneumonia, unspecified organism: Secondary | ICD-10-CM | POA: Diagnosis not present

## 2011-11-20 DIAGNOSIS — F411 Generalized anxiety disorder: Secondary | ICD-10-CM | POA: Diagnosis not present

## 2011-11-22 DIAGNOSIS — K219 Gastro-esophageal reflux disease without esophagitis: Secondary | ICD-10-CM | POA: Diagnosis not present

## 2011-11-22 DIAGNOSIS — M6281 Muscle weakness (generalized): Secondary | ICD-10-CM | POA: Diagnosis not present

## 2011-11-22 DIAGNOSIS — F411 Generalized anxiety disorder: Secondary | ICD-10-CM | POA: Diagnosis not present

## 2011-11-22 DIAGNOSIS — J189 Pneumonia, unspecified organism: Secondary | ICD-10-CM | POA: Diagnosis not present

## 2011-11-22 DIAGNOSIS — F329 Major depressive disorder, single episode, unspecified: Secondary | ICD-10-CM | POA: Diagnosis not present

## 2011-11-22 DIAGNOSIS — R269 Unspecified abnormalities of gait and mobility: Secondary | ICD-10-CM | POA: Diagnosis not present

## 2011-11-23 ENCOUNTER — Ambulatory Visit (HOSPITAL_COMMUNITY)
Admission: RE | Admit: 2011-11-23 | Discharge: 2011-11-23 | Disposition: A | Discharge status: Home / Self Care | Payer: Medicare Other | Source: Ambulatory Visit | Attending: Neurosurgery | Admitting: Neurosurgery

## 2011-11-23 DIAGNOSIS — M48061 Spinal stenosis, lumbar region without neurogenic claudication: Secondary | ICD-10-CM | POA: Insufficient documentation

## 2011-11-23 DIAGNOSIS — M2469 Ankylosis, other specified joint: Secondary | ICD-10-CM | POA: Insufficient documentation

## 2011-11-23 DIAGNOSIS — M5126 Other intervertebral disc displacement, lumbar region: Secondary | ICD-10-CM | POA: Diagnosis not present

## 2011-11-23 DIAGNOSIS — M79609 Pain in unspecified limb: Secondary | ICD-10-CM | POA: Insufficient documentation

## 2011-11-23 DIAGNOSIS — M545 Low back pain, unspecified: Secondary | ICD-10-CM

## 2011-11-23 DIAGNOSIS — M899 Disorder of bone, unspecified: Secondary | ICD-10-CM | POA: Diagnosis not present

## 2011-11-23 DIAGNOSIS — M5137 Other intervertebral disc degeneration, lumbosacral region: Secondary | ICD-10-CM | POA: Insufficient documentation

## 2011-11-23 DIAGNOSIS — M47817 Spondylosis without myelopathy or radiculopathy, lumbosacral region: Secondary | ICD-10-CM | POA: Diagnosis not present

## 2011-11-23 DIAGNOSIS — M51379 Other intervertebral disc degeneration, lumbosacral region without mention of lumbar back pain or lower extremity pain: Secondary | ICD-10-CM | POA: Insufficient documentation

## 2011-11-23 MED ORDER — DIAZEPAM 5 MG PO TABS
ORAL_TABLET | ORAL | Status: AC
  Administered 2011-11-23: 10 mg via ORAL
  Filled 2011-11-23: qty 2

## 2011-11-23 MED ORDER — OXYCODONE-ACETAMINOPHEN 5-325 MG PO TABS: 1.0000 | ORAL_TABLET | ORAL | Status: DC | PRN

## 2011-11-23 MED ORDER — DIAZEPAM 5 MG PO TABS: 10.0000 mg | ORAL_TABLET | Freq: Once | ORAL | Status: DC

## 2011-11-23 MED ORDER — DIAZEPAM 5 MG PO TABS
10.0000 mg | ORAL_TABLET | Freq: Once | ORAL | Status: AC
  Administered 2011-11-23: 10 mg via ORAL

## 2011-11-23 MED ORDER — ONDANSETRON HCL 4 MG/2ML IJ SOLN: 4.0000 mg | Freq: Four times a day (QID) | INTRAMUSCULAR | Status: DC | PRN

## 2011-11-23 MED ORDER — IOHEXOL 180 MG/ML  SOLN
20.0000 mL | Freq: Once | INTRAMUSCULAR | Status: AC | PRN
  Administered 2011-11-23: 20 mL via INTRATHECAL

## 2011-11-23 MED ORDER — MORPHINE SULFATE 4 MG/ML IJ SOLN: 2.0000 mg | INTRAMUSCULAR | Status: DC | PRN

## 2011-11-23 NOTE — H&P (Signed)
Subjective: The patient is a 71 year old white female who has complained of back and leg pain consistent with neurogenic claudication. She has failed medical management. I discussed working her up further with a lumbar mild CT. I've explained the procedure and risks benefits and alternatives the patient has decided proceed with that study.   No past medical history on file.  No past surgical history on file.  Allergies  Allergen Reactions  . Novocain (Procaine Hcl)     unknown  . Other Hives    EKG pads - need to use pediatric pads  . Penicillins Rash  . Sulfa Antibiotics Rash    History  Substance Use Topics  . Smoking status: Not on file  . Smokeless tobacco: Not on file  . Alcohol Use: Not on file    No family history on file. Prior to Admission medications   Medication Sig Start Date End Date Taking? Authorizing Provider  acetaminophen (TYLENOL) 500 MG tablet Take 500 mg by mouth every 6 (six) hours as needed. For pain   Yes Historical Provider, MD  Calcium Citrate-Vitamin D (CITRACAL PETITES/VITAMIN D) 200-250 MG-UNIT TABS Take 1 tablet by mouth daily.   Yes Historical Provider, MD  cephALEXin (KEFLEX) 500 MG capsule Take 2,000 mg by mouth once as needed. For dental appt.   Yes Historical Provider, MD  diazepam (VALIUM) 5 MG tablet Take 5 mg by mouth 3 (three) times daily as needed. For anxiety   Yes Historical Provider, MD  diphenhydrAMINE (BENADRYL) 25 MG tablet Take 25 mg by mouth daily as needed. For allergies   Yes Historical Provider, MD  docusate sodium (COLACE) 100 MG capsule Take 100 mg by mouth daily as needed. For stool softner   Yes Historical Provider, MD  HYDROcodone-acetaminophen (LORTAB) 10-500 MG per tablet Take 1 tablet by mouth every 8 (eight) hours as needed. For pain   Yes Historical Provider, MD  omeprazole (PRILOSEC) 20 MG capsule Take 20 mg by mouth 2 (two) times daily.   Yes Historical Provider, MD  potassium chloride (K-DUR) 10 MEQ tablet Take 20 mEq by  mouth 3 (three) times daily with meals.   Yes Historical Provider, MD  sertraline (ZOLOFT) 100 MG tablet Take 100 mg by mouth daily.   Yes Historical Provider, MD  traZODone (DESYREL) 100 MG tablet Take 100 mg by mouth at bedtime.   Yes Historical Provider, MD  triamterene-hydrochlorothiazide (MAXZIDE) 75-50 MG per tablet Take 1 tablet by mouth daily.   Yes Historical Provider, MD     Review of Systems  Positive ROS: As above  All other systems have been reviewed and were otherwise negative with the exception of those mentioned in the HPI and as above.  Objective: Vital signs in last 24 hours: Temp:  [96.8 F (36 C)] 96.8 F (36 C) (09/20 0619) Pulse Rate:  [56] 56  (09/20 0619) Resp:  [18] 18  (09/20 0619) BP: (124)/(69) 124/69 mmHg (09/20 0619) SpO2:  [98 %] 98 % (09/20 0619) Weight:  [103.874 kg (229 lb)] 103.874 kg (229 lb) (09/20 0619)  General Appearance: Alert, cooperative, no distress, appears stated age Head: Normocephalic, without obvious abnormality, atraumatic Eyes: PERRL, conjunctiva/corneas clear, EOM's intact, fundi benign, both eyes      Ears: Normal TM's and external ear canals, both ears Throat: Lips, mucosa, and tongue normal; teeth and gums normal Neck: Supple, symmetrical, trachea midline, no adenopathy; thyroid: No enlargement/tenderness/nodules; no carotid bruit or JVD Back: Symmetric, no curvature, ROM normal, no CVA tenderness Lungs: Clear  to auscultation bilaterally, respirations unlabored Heart: Regular rate and rhythm, S1 and S2 normal, no murmur, rub or gallop Abdomen: Soft, non-tender, bowel sounds active all four quadrants, no masses, no organomegaly Extremities: Extremities normal, atraumatic, no cyanosis or edema Pulses: 2+ and symmetric all extremities Skin: Skin color, texture, turgor normal, no rashes or lesions  NEUROLOGIC:   Mental status: alert and oriented, no aphasia, good attention span, Fund of knowledge/ memory ok Motor Exam -  grossly normal Sensory Exam - grossly normal Reflexes:  Coordination - grossly normal Gait - grossly normal Balance - grossly normal Cranial Nerves: I: smell Not tested  II: visual acuity  OS: Normal    OD: Normal   II: visual fields Full to confrontation  II: pupils Equal, round, reactive to light  III,VII: ptosis None  III,IV,VI: extraocular muscles  Full ROM  V: mastication Normal  V: facial light touch sensation  Normal  V,VII: corneal reflex  Present  VII: facial muscle function - upper  Normal  VII: facial muscle function - lower Normal  VIII: hearing Not tested  IX: soft palate elevation  Normal  IX,X: gag reflex Present  XI: trapezius strength  5/5  XI: sternocleidomastoid strength 5/5  XI: neck flexion strength  5/5  XII: tongue strength  Normal    Data Review Lab Results  Component Value Date   WBC 7.9 11/25/2009   HGB 14.1 11/25/2009   HCT 43.5 11/25/2009   MCV 93.8 11/25/2009   PLT 202 11/25/2009   Lab Results  Component Value Date   NA 136 11/25/2009   K 3.3* 11/25/2009   CL 100 11/25/2009   CO2 27 11/25/2009   BUN 21 11/25/2009   CREATININE 1.24* 11/25/2009   GLUCOSE 103* 11/25/2009   Lab Results  Component Value Date   INR 1.0 05/29/2007    Assessment/Plan: Lumbago, lumbar radiculopathy: The patient has decided proceed with a lumbar myelo CT.   Rahima Fleishman D 11/23/2011 7:39 AM

## 2011-11-23 NOTE — Op Note (Signed)
Brief history: The patient complains of back pain.  Procedure: Lumbar myelogram  Surgeon: Dr. Jeff Zyanna Leisinger  Asst.: None  Level injected:L5/S1  Dye injected: 15 cc of Omnipaque 180  CSF appearance: Clear  Estimated blood loss minimal  Complications: None    

## 2011-11-27 DIAGNOSIS — M6281 Muscle weakness (generalized): Secondary | ICD-10-CM | POA: Diagnosis not present

## 2011-11-27 DIAGNOSIS — F329 Major depressive disorder, single episode, unspecified: Secondary | ICD-10-CM | POA: Diagnosis not present

## 2011-11-27 DIAGNOSIS — F411 Generalized anxiety disorder: Secondary | ICD-10-CM | POA: Diagnosis not present

## 2011-11-27 DIAGNOSIS — J189 Pneumonia, unspecified organism: Secondary | ICD-10-CM | POA: Diagnosis not present

## 2011-11-27 DIAGNOSIS — K219 Gastro-esophageal reflux disease without esophagitis: Secondary | ICD-10-CM | POA: Diagnosis not present

## 2011-11-27 DIAGNOSIS — R269 Unspecified abnormalities of gait and mobility: Secondary | ICD-10-CM | POA: Diagnosis not present

## 2011-11-30 DIAGNOSIS — Z1231 Encounter for screening mammogram for malignant neoplasm of breast: Secondary | ICD-10-CM | POA: Diagnosis not present

## 2011-12-05 DIAGNOSIS — F411 Generalized anxiety disorder: Secondary | ICD-10-CM | POA: Diagnosis not present

## 2011-12-05 DIAGNOSIS — K219 Gastro-esophageal reflux disease without esophagitis: Secondary | ICD-10-CM | POA: Diagnosis not present

## 2011-12-05 DIAGNOSIS — F329 Major depressive disorder, single episode, unspecified: Secondary | ICD-10-CM | POA: Diagnosis not present

## 2011-12-05 DIAGNOSIS — R269 Unspecified abnormalities of gait and mobility: Secondary | ICD-10-CM | POA: Diagnosis not present

## 2011-12-05 DIAGNOSIS — M6281 Muscle weakness (generalized): Secondary | ICD-10-CM | POA: Diagnosis not present

## 2011-12-05 DIAGNOSIS — J189 Pneumonia, unspecified organism: Secondary | ICD-10-CM | POA: Diagnosis not present

## 2011-12-06 DIAGNOSIS — M545 Low back pain, unspecified: Secondary | ICD-10-CM | POA: Diagnosis not present

## 2011-12-07 DIAGNOSIS — F329 Major depressive disorder, single episode, unspecified: Secondary | ICD-10-CM | POA: Diagnosis not present

## 2011-12-07 DIAGNOSIS — K219 Gastro-esophageal reflux disease without esophagitis: Secondary | ICD-10-CM | POA: Diagnosis not present

## 2011-12-07 DIAGNOSIS — R269 Unspecified abnormalities of gait and mobility: Secondary | ICD-10-CM | POA: Diagnosis not present

## 2011-12-07 DIAGNOSIS — R059 Cough, unspecified: Secondary | ICD-10-CM | POA: Diagnosis not present

## 2011-12-07 DIAGNOSIS — F411 Generalized anxiety disorder: Secondary | ICD-10-CM | POA: Diagnosis not present

## 2011-12-07 DIAGNOSIS — R05 Cough: Secondary | ICD-10-CM | POA: Diagnosis not present

## 2011-12-07 DIAGNOSIS — J189 Pneumonia, unspecified organism: Secondary | ICD-10-CM | POA: Diagnosis not present

## 2011-12-07 DIAGNOSIS — R066 Hiccough: Secondary | ICD-10-CM | POA: Diagnosis not present

## 2011-12-07 DIAGNOSIS — M6281 Muscle weakness (generalized): Secondary | ICD-10-CM | POA: Diagnosis not present

## 2011-12-12 DIAGNOSIS — J189 Pneumonia, unspecified organism: Secondary | ICD-10-CM | POA: Diagnosis not present

## 2011-12-12 DIAGNOSIS — F329 Major depressive disorder, single episode, unspecified: Secondary | ICD-10-CM | POA: Diagnosis not present

## 2011-12-12 DIAGNOSIS — F411 Generalized anxiety disorder: Secondary | ICD-10-CM | POA: Diagnosis not present

## 2011-12-12 DIAGNOSIS — M6281 Muscle weakness (generalized): Secondary | ICD-10-CM | POA: Diagnosis not present

## 2011-12-12 DIAGNOSIS — R269 Unspecified abnormalities of gait and mobility: Secondary | ICD-10-CM | POA: Diagnosis not present

## 2011-12-12 DIAGNOSIS — K219 Gastro-esophageal reflux disease without esophagitis: Secondary | ICD-10-CM | POA: Diagnosis not present

## 2011-12-13 DIAGNOSIS — D2330 Other benign neoplasm of skin of unspecified part of face: Secondary | ICD-10-CM | POA: Diagnosis not present

## 2011-12-13 DIAGNOSIS — Z85828 Personal history of other malignant neoplasm of skin: Secondary | ICD-10-CM | POA: Diagnosis not present

## 2011-12-13 DIAGNOSIS — L820 Inflamed seborrheic keratosis: Secondary | ICD-10-CM | POA: Diagnosis not present

## 2011-12-21 DIAGNOSIS — M47817 Spondylosis without myelopathy or radiculopathy, lumbosacral region: Secondary | ICD-10-CM | POA: Diagnosis not present

## 2011-12-21 DIAGNOSIS — M48061 Spinal stenosis, lumbar region without neurogenic claudication: Secondary | ICD-10-CM | POA: Diagnosis not present

## 2011-12-21 DIAGNOSIS — IMO0002 Reserved for concepts with insufficient information to code with codable children: Secondary | ICD-10-CM | POA: Diagnosis not present

## 2011-12-23 DIAGNOSIS — J209 Acute bronchitis, unspecified: Secondary | ICD-10-CM | POA: Diagnosis not present

## 2011-12-23 DIAGNOSIS — R05 Cough: Secondary | ICD-10-CM | POA: Diagnosis not present

## 2011-12-23 DIAGNOSIS — Z79899 Other long term (current) drug therapy: Secondary | ICD-10-CM | POA: Diagnosis not present

## 2011-12-23 DIAGNOSIS — J40 Bronchitis, not specified as acute or chronic: Secondary | ICD-10-CM | POA: Diagnosis not present

## 2011-12-23 DIAGNOSIS — R059 Cough, unspecified: Secondary | ICD-10-CM | POA: Diagnosis not present

## 2011-12-23 DIAGNOSIS — I10 Essential (primary) hypertension: Secondary | ICD-10-CM | POA: Diagnosis not present

## 2011-12-23 DIAGNOSIS — R0602 Shortness of breath: Secondary | ICD-10-CM | POA: Diagnosis not present

## 2011-12-27 DIAGNOSIS — R042 Hemoptysis: Secondary | ICD-10-CM | POA: Diagnosis not present

## 2011-12-27 DIAGNOSIS — F329 Major depressive disorder, single episode, unspecified: Secondary | ICD-10-CM | POA: Diagnosis not present

## 2011-12-27 DIAGNOSIS — R05 Cough: Secondary | ICD-10-CM | POA: Diagnosis not present

## 2011-12-27 DIAGNOSIS — K219 Gastro-esophageal reflux disease without esophagitis: Secondary | ICD-10-CM | POA: Diagnosis not present

## 2011-12-27 DIAGNOSIS — J180 Bronchopneumonia, unspecified organism: Secondary | ICD-10-CM | POA: Diagnosis not present

## 2011-12-27 DIAGNOSIS — R059 Cough, unspecified: Secondary | ICD-10-CM | POA: Diagnosis not present

## 2011-12-27 DIAGNOSIS — I10 Essential (primary) hypertension: Secondary | ICD-10-CM | POA: Diagnosis not present

## 2012-01-09 DIAGNOSIS — M48061 Spinal stenosis, lumbar region without neurogenic claudication: Secondary | ICD-10-CM | POA: Diagnosis not present

## 2012-01-23 DIAGNOSIS — M48061 Spinal stenosis, lumbar region without neurogenic claudication: Secondary | ICD-10-CM | POA: Diagnosis not present

## 2012-03-20 DIAGNOSIS — M67919 Unspecified disorder of synovium and tendon, unspecified shoulder: Secondary | ICD-10-CM | POA: Diagnosis not present

## 2012-03-20 DIAGNOSIS — M719 Bursopathy, unspecified: Secondary | ICD-10-CM | POA: Diagnosis not present

## 2012-03-24 DIAGNOSIS — M75100 Unspecified rotator cuff tear or rupture of unspecified shoulder, not specified as traumatic: Secondary | ICD-10-CM | POA: Diagnosis not present

## 2012-03-24 DIAGNOSIS — R609 Edema, unspecified: Secondary | ICD-10-CM | POA: Diagnosis not present

## 2012-03-24 DIAGNOSIS — M19019 Primary osteoarthritis, unspecified shoulder: Secondary | ICD-10-CM | POA: Diagnosis not present

## 2012-03-26 DIAGNOSIS — L723 Sebaceous cyst: Secondary | ICD-10-CM | POA: Diagnosis not present

## 2012-03-26 DIAGNOSIS — F329 Major depressive disorder, single episode, unspecified: Secondary | ICD-10-CM | POA: Diagnosis not present

## 2012-03-26 DIAGNOSIS — K219 Gastro-esophageal reflux disease without esophagitis: Secondary | ICD-10-CM | POA: Diagnosis not present

## 2012-03-26 DIAGNOSIS — R300 Dysuria: Secondary | ICD-10-CM | POA: Diagnosis not present

## 2012-03-26 DIAGNOSIS — R5381 Other malaise: Secondary | ICD-10-CM | POA: Diagnosis not present

## 2012-03-26 DIAGNOSIS — I10 Essential (primary) hypertension: Secondary | ICD-10-CM | POA: Diagnosis not present

## 2012-03-27 DIAGNOSIS — M719 Bursopathy, unspecified: Secondary | ICD-10-CM | POA: Diagnosis not present

## 2012-03-27 DIAGNOSIS — M67919 Unspecified disorder of synovium and tendon, unspecified shoulder: Secondary | ICD-10-CM | POA: Diagnosis not present

## 2012-03-31 DIAGNOSIS — M19019 Primary osteoarthritis, unspecified shoulder: Secondary | ICD-10-CM | POA: Diagnosis not present

## 2012-03-31 DIAGNOSIS — IMO0001 Reserved for inherently not codable concepts without codable children: Secondary | ICD-10-CM | POA: Diagnosis not present

## 2012-04-07 DIAGNOSIS — IMO0001 Reserved for inherently not codable concepts without codable children: Secondary | ICD-10-CM | POA: Diagnosis not present

## 2012-04-07 DIAGNOSIS — M25519 Pain in unspecified shoulder: Secondary | ICD-10-CM | POA: Diagnosis not present

## 2012-04-07 DIAGNOSIS — M6281 Muscle weakness (generalized): Secondary | ICD-10-CM | POA: Diagnosis not present

## 2012-04-09 DIAGNOSIS — M25519 Pain in unspecified shoulder: Secondary | ICD-10-CM | POA: Diagnosis not present

## 2012-04-09 DIAGNOSIS — IMO0001 Reserved for inherently not codable concepts without codable children: Secondary | ICD-10-CM | POA: Diagnosis not present

## 2012-04-09 DIAGNOSIS — M6281 Muscle weakness (generalized): Secondary | ICD-10-CM | POA: Diagnosis not present

## 2012-04-14 DIAGNOSIS — M25519 Pain in unspecified shoulder: Secondary | ICD-10-CM | POA: Diagnosis not present

## 2012-04-14 DIAGNOSIS — IMO0001 Reserved for inherently not codable concepts without codable children: Secondary | ICD-10-CM | POA: Diagnosis not present

## 2012-04-14 DIAGNOSIS — M6281 Muscle weakness (generalized): Secondary | ICD-10-CM | POA: Diagnosis not present

## 2012-04-21 DIAGNOSIS — M6281 Muscle weakness (generalized): Secondary | ICD-10-CM | POA: Diagnosis not present

## 2012-04-21 DIAGNOSIS — M25519 Pain in unspecified shoulder: Secondary | ICD-10-CM | POA: Diagnosis not present

## 2012-04-21 DIAGNOSIS — IMO0001 Reserved for inherently not codable concepts without codable children: Secondary | ICD-10-CM | POA: Diagnosis not present

## 2012-04-24 DIAGNOSIS — M25519 Pain in unspecified shoulder: Secondary | ICD-10-CM | POA: Diagnosis not present

## 2012-04-28 DIAGNOSIS — M25519 Pain in unspecified shoulder: Secondary | ICD-10-CM | POA: Diagnosis not present

## 2012-04-28 DIAGNOSIS — M9420 Chondromalacia, unspecified site: Secondary | ICD-10-CM | POA: Diagnosis not present

## 2012-04-28 DIAGNOSIS — M24019 Loose body in unspecified shoulder: Secondary | ICD-10-CM | POA: Diagnosis not present

## 2012-04-28 DIAGNOSIS — G8918 Other acute postprocedural pain: Secondary | ICD-10-CM | POA: Diagnosis not present

## 2012-04-28 DIAGNOSIS — M24119 Other articular cartilage disorders, unspecified shoulder: Secondary | ICD-10-CM | POA: Diagnosis not present

## 2012-04-28 DIAGNOSIS — M19019 Primary osteoarthritis, unspecified shoulder: Secondary | ICD-10-CM | POA: Diagnosis not present

## 2012-04-28 DIAGNOSIS — M67919 Unspecified disorder of synovium and tendon, unspecified shoulder: Secondary | ICD-10-CM | POA: Diagnosis not present

## 2012-06-25 DIAGNOSIS — D485 Neoplasm of uncertain behavior of skin: Secondary | ICD-10-CM | POA: Diagnosis not present

## 2012-06-25 DIAGNOSIS — C4441 Basal cell carcinoma of skin of scalp and neck: Secondary | ICD-10-CM | POA: Diagnosis not present

## 2012-06-25 DIAGNOSIS — L821 Other seborrheic keratosis: Secondary | ICD-10-CM | POA: Diagnosis not present

## 2012-07-21 DIAGNOSIS — C4491 Basal cell carcinoma of skin, unspecified: Secondary | ICD-10-CM | POA: Diagnosis not present

## 2012-07-21 DIAGNOSIS — C4441 Basal cell carcinoma of skin of scalp and neck: Secondary | ICD-10-CM | POA: Diagnosis not present

## 2012-07-24 DIAGNOSIS — M66329 Spontaneous rupture of flexor tendons, unspecified upper arm: Secondary | ICD-10-CM | POA: Diagnosis not present

## 2012-07-31 DIAGNOSIS — K120 Recurrent oral aphthae: Secondary | ICD-10-CM | POA: Diagnosis not present

## 2012-07-31 DIAGNOSIS — L03319 Cellulitis of trunk, unspecified: Secondary | ICD-10-CM | POA: Diagnosis not present

## 2012-07-31 DIAGNOSIS — E876 Hypokalemia: Secondary | ICD-10-CM | POA: Diagnosis not present

## 2012-07-31 DIAGNOSIS — L02219 Cutaneous abscess of trunk, unspecified: Secondary | ICD-10-CM | POA: Diagnosis not present

## 2012-09-08 DIAGNOSIS — E876 Hypokalemia: Secondary | ICD-10-CM | POA: Diagnosis not present

## 2012-09-15 DIAGNOSIS — F329 Major depressive disorder, single episode, unspecified: Secondary | ICD-10-CM | POA: Diagnosis not present

## 2012-09-15 DIAGNOSIS — E876 Hypokalemia: Secondary | ICD-10-CM | POA: Diagnosis not present

## 2012-09-15 DIAGNOSIS — M129 Arthropathy, unspecified: Secondary | ICD-10-CM | POA: Diagnosis not present

## 2012-09-15 DIAGNOSIS — K219 Gastro-esophageal reflux disease without esophagitis: Secondary | ICD-10-CM | POA: Diagnosis not present

## 2012-09-15 DIAGNOSIS — I10 Essential (primary) hypertension: Secondary | ICD-10-CM | POA: Diagnosis not present

## 2012-10-27 DIAGNOSIS — J40 Bronchitis, not specified as acute or chronic: Secondary | ICD-10-CM | POA: Diagnosis not present

## 2012-10-27 DIAGNOSIS — J019 Acute sinusitis, unspecified: Secondary | ICD-10-CM | POA: Diagnosis not present

## 2012-11-19 DIAGNOSIS — L259 Unspecified contact dermatitis, unspecified cause: Secondary | ICD-10-CM | POA: Diagnosis not present

## 2012-12-18 DIAGNOSIS — M19019 Primary osteoarthritis, unspecified shoulder: Secondary | ICD-10-CM | POA: Diagnosis not present

## 2012-12-18 DIAGNOSIS — M542 Cervicalgia: Secondary | ICD-10-CM | POA: Diagnosis not present

## 2012-12-31 ENCOUNTER — Encounter (HOSPITAL_COMMUNITY): Payer: Self-pay | Admitting: Pharmacy Technician

## 2013-01-05 ENCOUNTER — Other Ambulatory Visit (HOSPITAL_COMMUNITY): Payer: Self-pay | Admitting: Orthopaedic Surgery

## 2013-01-05 NOTE — H&P (Signed)
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   HISTORY OF PRESENT ILLNESS:  A 72 year old female with persistent right shoulder osteoarthritis.  She has an intact rotator cuff.  She has pain with activities of daily living. Even picking up a cup of coffee, stirring objects, getting dressed, and brushing her teeth are all painful.  She is ambulatory with a cane in her right hand due to some left leg weakness after a total knee arthroplasty many years ago.  She had shoulder arthroscopy with debridement of SLAP tear of the biceps tendon done in February 2014 by Dr. Ophelia Charter.  Findings at that time showed grade 3/grade 4 chondromalacia with most of the humeral head completely worn to the point where it was exposed subchondral bone.  She had loose bodies that were removed.  She is having some catching in her shoulder once again.  The patient has had intra-articular injections, physical therapy and activity modification, all with persistent pain problems.    MEDICATIONS:   Include triamterene/hydrochlorothiazide 25/50 one p.o. daily, Zoloft 100 mg at night, trazodone 50 mg at night, Valacyclovir 510 mg one p.o. daily for cold sores, clobetasol 0.05% solution on her scalp 2 times daily, Valium 5 mg t.i.d. p.r.n., hydrocodone 10/325 two p.o. daily, stool softener p.r.n., Citrucel 1 p.o. daily, Benadryl p.r.n., and Prilosec 20 mg 2 p.o. daily.   ALLERGIES:  The patient is allergic to penicillin and also Novocain.  She is not allergic to Marcaine.  She is allergic to sulfa.   PAST SURGICAL HISTORY:  Include shoulder arthroscopy in 2007 on the left, left total hip arthroplasty in 2010 or 2011, cervical spine surgery with  Dr. Lovell Sheehan in 2010, left total knee arthroplasty 10+ years ago.   FAMILY HISTORY:  Positive for breast and colon cancer.   SOCIAL HISTORY:  The patient is single.  Retired. Does not smoke or drink.     REVIEW OF SYSTEMS:  A 14-point review of systems is positive for depression, acid reflux, arthritis, hypertension, and pneumonia.   PHYSICAL EXAMINATION:  Height 5 feet 3 inches.  Weight 225 pounds.  She is alert and oriented.  Cervical range of motion is full.  Well-healed anterior cervical fusion incision.  She is able to get her arm up over her head.  She has pain with internal and external rotation.  She has pain with extension and she can reach back to the midline with pain.  Flexion to 130 degrees with pain.  Abduction to 135 degrees with pain.  Sensation in her hands intact.  She has some mild tenderness over the carpal canal.  No thenar atrophy.  Biceps and triceps reflexes intact.   RADIOGRAPHS:  We obtained 2-view x-rays of the cervical spine today for evaluation. X-rays demonstrate marginal osteophytes, subchondral sclerosis.  No high-riding of the humeral head.  Glenoid osteophytes as well with mild AC joint degenerative changes.  Two views of the cervical spine shows anterior cervical plate and grafts with satisfactory consolidation.  No loosening with C4 to C7 fusion, with cages.  No evidence of pseudoarthrosis.   ASSESSMENT:  Right shoulder osteoarthritis.  Pain with activities of daily living.  She states she is not able to  pick up her grandchild.   PLAN:  She states she would like to proceed with a total shoulder arthroplasty.  We discussed admission to the hospital, 1 to 2-night stay.  She has a niece she would be with after surgery.  We discussed outpatient therapy for her shoulder.       Adley Mazurowski C. Ophelia Charter, M.D.

## 2013-01-07 ENCOUNTER — Ambulatory Visit (HOSPITAL_COMMUNITY)
Admission: RE | Admit: 2013-01-07 | Discharge: 2013-01-07 | Disposition: A | Discharge status: Home / Self Care | Payer: Medicare Other | Source: Ambulatory Visit | Attending: Orthopaedic Surgery | Admitting: Orthopaedic Surgery

## 2013-01-07 ENCOUNTER — Encounter (HOSPITAL_COMMUNITY): Payer: Self-pay

## 2013-01-07 ENCOUNTER — Encounter (HOSPITAL_COMMUNITY)
Admission: RE | Admit: 2013-01-07 | Discharge: 2013-01-07 | Disposition: A | Discharge status: Home / Self Care | Payer: Medicare Other | Source: Ambulatory Visit | Attending: Orthopaedic Surgery | Admitting: Orthopaedic Surgery

## 2013-01-07 DIAGNOSIS — Z01818 Encounter for other preprocedural examination: Secondary | ICD-10-CM | POA: Diagnosis not present

## 2013-01-07 DIAGNOSIS — Z01812 Encounter for preprocedural laboratory examination: Secondary | ICD-10-CM | POA: Insufficient documentation

## 2013-01-07 DIAGNOSIS — Z01810 Encounter for preprocedural cardiovascular examination: Secondary | ICD-10-CM | POA: Insufficient documentation

## 2013-01-07 DIAGNOSIS — Z01811 Encounter for preprocedural respiratory examination: Secondary | ICD-10-CM | POA: Diagnosis not present

## 2013-01-07 HISTORY — DX: Major depressive disorder, single episode, unspecified: F32.9

## 2013-01-07 HISTORY — DX: Pneumonia, unspecified organism: J18.9

## 2013-01-07 HISTORY — DX: Other specified postprocedural states: Z98.890

## 2013-01-07 HISTORY — DX: Gastro-esophageal reflux disease without esophagitis: K21.9

## 2013-01-07 HISTORY — DX: Other specified postprocedural states: R11.2

## 2013-01-07 HISTORY — DX: Anemia, unspecified: D64.9

## 2013-01-07 HISTORY — DX: Personal history of other diseases of the digestive system: Z87.19

## 2013-01-07 HISTORY — DX: Depression, unspecified: F32.A

## 2013-01-07 HISTORY — DX: Unspecified osteoarthritis, unspecified site: M19.90

## 2013-01-07 HISTORY — DX: Essential (primary) hypertension: I10

## 2013-01-07 HISTORY — DX: Anxiety disorder, unspecified: F41.9

## 2013-01-07 HISTORY — DX: Other seasonal allergic rhinitis: J30.2

## 2013-01-07 LAB — CBC
HCT: 40.5 % (ref 36.0–46.0)
Hemoglobin: 13.1 g/dL (ref 12.0–15.0)
MCH: 28.1 pg (ref 26.0–34.0)
MCHC: 32.3 g/dL (ref 30.0–36.0)
MCV: 86.7 fL (ref 78.0–100.0)
Platelets: 232 10*3/uL (ref 150–400)
RBC: 4.67 MIL/uL (ref 3.87–5.11)
RDW: 14.7 % (ref 11.5–15.5)
WBC: 7.9 10*3/uL (ref 4.0–10.5)

## 2013-01-07 LAB — URINALYSIS, ROUTINE W REFLEX MICROSCOPIC
Bilirubin Urine: NEGATIVE
Glucose, UA: NEGATIVE mg/dL
Hgb urine dipstick: NEGATIVE
Ketones, ur: NEGATIVE mg/dL
Nitrite: NEGATIVE
Protein, ur: NEGATIVE mg/dL
Specific Gravity, Urine: 1.016 (ref 1.005–1.030)
Urobilinogen, UA: 0.2 mg/dL (ref 0.0–1.0)
pH: 5.5 (ref 5.0–8.0)

## 2013-01-07 LAB — COMPREHENSIVE METABOLIC PANEL
ALT: 10 U/L (ref 0–35)
AST: 13 U/L (ref 0–37)
Albumin: 3.9 g/dL (ref 3.5–5.2)
Alkaline Phosphatase: 178 U/L — ABNORMAL HIGH (ref 39–117)
BUN: 25 mg/dL — ABNORMAL HIGH (ref 6–23)
CO2: 26 mEq/L (ref 19–32)
Calcium: 9.1 mg/dL (ref 8.4–10.5)
Chloride: 99 mEq/L (ref 96–112)
Creatinine, Ser: 1.47 mg/dL — ABNORMAL HIGH (ref 0.50–1.10)
GFR calc Af Amer: 40 mL/min — ABNORMAL LOW (ref >90)
GFR calc non Af Amer: 35 mL/min — ABNORMAL LOW (ref >90)
Glucose, Bld: 111 mg/dL — ABNORMAL HIGH (ref 70–99)
Potassium: 3.7 mEq/L (ref 3.5–5.1)
Sodium: 138 mEq/L (ref 135–145)
Total Bilirubin: 0.2 mg/dL — ABNORMAL LOW (ref 0.3–1.2)
Total Protein: 7.5 g/dL (ref 6.0–8.3)

## 2013-01-07 LAB — URINE MICROSCOPIC-ADD ON

## 2013-01-07 LAB — TYPE AND SCREEN
ABO/RH(D): A POS
Antibody Screen: NEGATIVE

## 2013-01-07 NOTE — Pre-Procedure Instructions (Signed)
[name] [name]  01/07/2013   Your procedure is scheduled on:  Friday, Nov. 7 at 0959  Report to [facility] Main Entrance "A" at 0759 AM.  Call this number if you have problems the morning of surgery: [phone]   Remember:   Do not eat food or drink liquids after midnight.   Take these medicines the morning of surgery with A SIP OF WATER: valium, hydrocodone,  prilosec,   zoloft   Do not wear jewelry, make-up or nail polish.  Do not wear lotions, powders, or perfumes. You may wear deodorant.  Do not shave 48 hours prior to surgery. Men may shave face and neck.  Do not bring valuables to the hospital.  [facility] is not responsible  for any belongings or valuables.               Contacts, dentures or bridgework may not be worn into surgery.  Leave suitcase in the car. After surgery it may be brought to your room.  For patients admitted to the hospital, discharge time is determined by your   treatment team.               Patients discharged the day of surgery will not be allowed to drive home.  Name and phone number of your driver:   Special Instructions: Shower using CHG 2 nights before surgery and the night before surgery.  If you shower the day of surgery use CHG.  Use special wash - you have one bottle of CHG for all showers.  You should use approximately 1/3 of the bottle for each shower.   Please read over the following fact sheets that you were given: Pain Booklet, Coughing and Deep Breathing, Blood Transfusion Information and Surgical Site Infection Prevention

## 2013-01-09 ENCOUNTER — Encounter (HOSPITAL_COMMUNITY): Admission: RE | Payer: Self-pay | Source: Ambulatory Visit

## 2013-01-09 ENCOUNTER — Inpatient Hospital Stay (HOSPITAL_COMMUNITY): Admission: RE | Admit: 2013-01-09 | Payer: Medicare Other | Source: Ambulatory Visit | Admitting: Orthopaedic Surgery

## 2013-01-09 DIAGNOSIS — R7309 Other abnormal glucose: Secondary | ICD-10-CM | POA: Diagnosis not present

## 2013-01-09 DIAGNOSIS — M129 Arthropathy, unspecified: Secondary | ICD-10-CM | POA: Diagnosis not present

## 2013-01-09 DIAGNOSIS — K219 Gastro-esophageal reflux disease without esophagitis: Secondary | ICD-10-CM | POA: Diagnosis not present

## 2013-01-09 DIAGNOSIS — E876 Hypokalemia: Secondary | ICD-10-CM | POA: Diagnosis not present

## 2013-01-09 DIAGNOSIS — I10 Essential (primary) hypertension: Secondary | ICD-10-CM | POA: Diagnosis not present

## 2013-01-09 SURGERY — ARTHROPLASTY, SHOULDER, TOTAL
Anesthesia: General | Site: Shoulder | Laterality: Right

## 2013-01-13 DIAGNOSIS — N189 Chronic kidney disease, unspecified: Secondary | ICD-10-CM | POA: Diagnosis not present

## 2013-01-13 DIAGNOSIS — Z23 Encounter for immunization: Secondary | ICD-10-CM | POA: Diagnosis not present

## 2013-01-13 DIAGNOSIS — I10 Essential (primary) hypertension: Secondary | ICD-10-CM | POA: Diagnosis not present

## 2013-01-29 DIAGNOSIS — Z880 Allergy status to penicillin: Secondary | ICD-10-CM | POA: Diagnosis not present

## 2013-01-29 DIAGNOSIS — Z8249 Family history of ischemic heart disease and other diseases of the circulatory system: Secondary | ICD-10-CM | POA: Diagnosis not present

## 2013-01-29 DIAGNOSIS — R197 Diarrhea, unspecified: Secondary | ICD-10-CM | POA: Diagnosis not present

## 2013-01-29 DIAGNOSIS — Z96649 Presence of unspecified artificial hip joint: Secondary | ICD-10-CM | POA: Diagnosis not present

## 2013-01-29 DIAGNOSIS — K5289 Other specified noninfective gastroenteritis and colitis: Secondary | ICD-10-CM | POA: Diagnosis not present

## 2013-01-29 DIAGNOSIS — Z9884 Bariatric surgery status: Secondary | ICD-10-CM | POA: Diagnosis not present

## 2013-01-29 DIAGNOSIS — R109 Unspecified abdominal pain: Secondary | ICD-10-CM | POA: Diagnosis not present

## 2013-01-29 DIAGNOSIS — D649 Anemia, unspecified: Secondary | ICD-10-CM | POA: Diagnosis present

## 2013-01-29 DIAGNOSIS — Z91010 Allergy to peanuts: Secondary | ICD-10-CM | POA: Diagnosis not present

## 2013-01-29 DIAGNOSIS — J40 Bronchitis, not specified as acute or chronic: Secondary | ICD-10-CM | POA: Diagnosis not present

## 2013-01-29 DIAGNOSIS — Z79899 Other long term (current) drug therapy: Secondary | ICD-10-CM | POA: Diagnosis not present

## 2013-01-29 DIAGNOSIS — I10 Essential (primary) hypertension: Secondary | ICD-10-CM | POA: Diagnosis not present

## 2013-01-29 DIAGNOSIS — K219 Gastro-esophageal reflux disease without esophagitis: Secondary | ICD-10-CM | POA: Diagnosis present

## 2013-01-29 DIAGNOSIS — Z800 Family history of malignant neoplasm of digestive organs: Secondary | ICD-10-CM | POA: Diagnosis not present

## 2013-01-29 DIAGNOSIS — Z8701 Personal history of pneumonia (recurrent): Secondary | ICD-10-CM | POA: Diagnosis not present

## 2013-01-29 DIAGNOSIS — R1110 Vomiting, unspecified: Secondary | ICD-10-CM | POA: Diagnosis not present

## 2013-01-29 DIAGNOSIS — Z888 Allergy status to other drugs, medicaments and biological substances status: Secondary | ICD-10-CM | POA: Diagnosis not present

## 2013-01-29 DIAGNOSIS — K56609 Unspecified intestinal obstruction, unspecified as to partial versus complete obstruction: Secondary | ICD-10-CM | POA: Diagnosis not present

## 2013-01-29 DIAGNOSIS — Z96659 Presence of unspecified artificial knee joint: Secondary | ICD-10-CM | POA: Diagnosis not present

## 2013-01-29 DIAGNOSIS — Z6841 Body Mass Index (BMI) 40.0 and over, adult: Secondary | ICD-10-CM | POA: Diagnosis not present

## 2013-01-29 DIAGNOSIS — Z91040 Latex allergy status: Secondary | ICD-10-CM | POA: Diagnosis not present

## 2013-01-29 DIAGNOSIS — IMO0002 Reserved for concepts with insufficient information to code with codable children: Secondary | ICD-10-CM | POA: Diagnosis present

## 2013-01-29 DIAGNOSIS — R05 Cough: Secondary | ICD-10-CM | POA: Diagnosis not present

## 2013-01-29 DIAGNOSIS — R059 Cough, unspecified: Secondary | ICD-10-CM | POA: Diagnosis not present

## 2013-01-29 DIAGNOSIS — Z882 Allergy status to sulfonamides status: Secondary | ICD-10-CM | POA: Diagnosis not present

## 2013-01-29 DIAGNOSIS — J9819 Other pulmonary collapse: Secondary | ICD-10-CM | POA: Diagnosis not present

## 2013-01-29 DIAGNOSIS — A09 Infectious gastroenteritis and colitis, unspecified: Secondary | ICD-10-CM | POA: Diagnosis not present

## 2013-01-29 DIAGNOSIS — J189 Pneumonia, unspecified organism: Secondary | ICD-10-CM | POA: Diagnosis not present

## 2013-01-29 DIAGNOSIS — R0602 Shortness of breath: Secondary | ICD-10-CM | POA: Diagnosis not present

## 2013-02-05 DIAGNOSIS — F411 Generalized anxiety disorder: Secondary | ICD-10-CM | POA: Diagnosis not present

## 2013-02-05 DIAGNOSIS — K56609 Unspecified intestinal obstruction, unspecified as to partial versus complete obstruction: Secondary | ICD-10-CM | POA: Diagnosis not present

## 2013-02-05 DIAGNOSIS — E876 Hypokalemia: Secondary | ICD-10-CM | POA: Diagnosis not present

## 2013-02-05 DIAGNOSIS — R141 Gas pain: Secondary | ICD-10-CM | POA: Diagnosis not present

## 2013-02-05 DIAGNOSIS — R1084 Generalized abdominal pain: Secondary | ICD-10-CM | POA: Diagnosis not present

## 2013-02-05 DIAGNOSIS — Z96659 Presence of unspecified artificial knee joint: Secondary | ICD-10-CM | POA: Diagnosis not present

## 2013-02-05 DIAGNOSIS — Z79899 Other long term (current) drug therapy: Secondary | ICD-10-CM | POA: Diagnosis not present

## 2013-02-05 DIAGNOSIS — K219 Gastro-esophageal reflux disease without esophagitis: Secondary | ICD-10-CM | POA: Diagnosis not present

## 2013-02-05 DIAGNOSIS — I10 Essential (primary) hypertension: Secondary | ICD-10-CM | POA: Diagnosis not present

## 2013-02-05 DIAGNOSIS — Z96649 Presence of unspecified artificial hip joint: Secondary | ICD-10-CM | POA: Diagnosis not present

## 2013-02-05 DIAGNOSIS — F329 Major depressive disorder, single episode, unspecified: Secondary | ICD-10-CM | POA: Diagnosis not present

## 2013-02-05 DIAGNOSIS — Z9884 Bariatric surgery status: Secondary | ICD-10-CM | POA: Diagnosis not present

## 2013-02-06 DIAGNOSIS — K56609 Unspecified intestinal obstruction, unspecified as to partial versus complete obstruction: Secondary | ICD-10-CM | POA: Diagnosis not present

## 2013-02-07 DIAGNOSIS — K56609 Unspecified intestinal obstruction, unspecified as to partial versus complete obstruction: Secondary | ICD-10-CM | POA: Diagnosis not present

## 2013-02-11 ENCOUNTER — Other Ambulatory Visit (HOSPITAL_COMMUNITY): Payer: Self-pay | Admitting: Orthopaedic Surgery

## 2013-02-11 ENCOUNTER — Encounter (HOSPITAL_COMMUNITY): Payer: Self-pay

## 2013-02-12 ENCOUNTER — Encounter (HOSPITAL_COMMUNITY)
Admission: RE | Admit: 2013-02-12 | Discharge: 2013-02-12 | Disposition: A | Discharge status: Home / Self Care | Payer: Medicare Other | Source: Ambulatory Visit | Attending: Orthopaedic Surgery | Admitting: Orthopaedic Surgery

## 2013-02-12 DIAGNOSIS — Z01812 Encounter for preprocedural laboratory examination: Secondary | ICD-10-CM | POA: Diagnosis not present

## 2013-02-12 DIAGNOSIS — Z01818 Encounter for other preprocedural examination: Secondary | ICD-10-CM | POA: Insufficient documentation

## 2013-02-12 LAB — CBC
HCT: 36.1 % (ref 36.0–46.0)
Hemoglobin: 11.6 g/dL — ABNORMAL LOW (ref 12.0–15.0)
MCH: 27.6 pg (ref 26.0–34.0)
MCHC: 32.1 g/dL (ref 30.0–36.0)
MCV: 85.7 fL (ref 78.0–100.0)
Platelets: 267 10*3/uL (ref 150–400)
RBC: 4.21 MIL/uL (ref 3.87–5.11)
RDW: 15 % (ref 11.5–15.5)
WBC: 9.3 10*3/uL (ref 4.0–10.5)

## 2013-02-12 LAB — COMPREHENSIVE METABOLIC PANEL
ALT: 9 U/L (ref 0–35)
AST: 11 U/L (ref 0–37)
Albumin: 3.6 g/dL (ref 3.5–5.2)
Alkaline Phosphatase: 135 U/L — ABNORMAL HIGH (ref 39–117)
BUN: 23 mg/dL (ref 6–23)
CO2: 25 mEq/L (ref 19–32)
Calcium: 8.4 mg/dL (ref 8.4–10.5)
Chloride: 103 mEq/L (ref 96–112)
Creatinine, Ser: 1.16 mg/dL — ABNORMAL HIGH (ref 0.50–1.10)
GFR calc Af Amer: 53 mL/min — ABNORMAL LOW (ref >90)
GFR calc non Af Amer: 46 mL/min — ABNORMAL LOW (ref >90)
Glucose, Bld: 117 mg/dL — ABNORMAL HIGH (ref 70–99)
Potassium: 3.6 mEq/L (ref 3.5–5.1)
Sodium: 138 mEq/L (ref 135–145)
Total Bilirubin: 0.3 mg/dL (ref 0.3–1.2)
Total Protein: 6.9 g/dL (ref 6.0–8.3)

## 2013-02-12 LAB — TYPE AND SCREEN
ABO/RH(D): A POS
Antibody Screen: NEGATIVE

## 2013-02-12 LAB — URINALYSIS, ROUTINE W REFLEX MICROSCOPIC
Glucose, UA: NEGATIVE mg/dL
Hgb urine dipstick: NEGATIVE
Ketones, ur: NEGATIVE mg/dL
Leukocytes, UA: NEGATIVE
Nitrite: NEGATIVE
Protein, ur: NEGATIVE mg/dL
Specific Gravity, Urine: 1.028 (ref 1.005–1.030)
Urobilinogen, UA: 0.2 mg/dL (ref 0.0–1.0)
pH: 5.5 (ref 5.0–8.0)

## 2013-02-12 LAB — SURGICAL PCR SCREEN
MRSA, PCR: NEGATIVE
Staphylococcus aureus: NEGATIVE

## 2013-02-12 MED ORDER — CHLORHEXIDINE GLUCONATE 4 % EX LIQD: 60.0000 mL | Freq: Once | CUTANEOUS | Status: DC

## 2013-02-12 NOTE — Progress Notes (Signed)
11/5 EKG image partial, John in [facility] contacted.

## 2013-02-12 NOTE — Pre-Procedure Instructions (Addendum)
COURTNE LIGHTY  02/12/2013   Your procedure is scheduled on:  February 16, 2013  Report to [facility] Tower(Entrance A) at 10:30 AM.  Call this number if you have problems the morning of surgery: [phone]   Remember:   Do not eat food or drink liquids after midnight.   Take these medicines the morning of surgery with A SIP OF WATER: pain pill as needed, diazepam (VALIUM) as needed for anxiety, diphenhydrAMINE (BENADRYL) as needed  for allergies, omeprazole (PRILOSEC), sertraline (ZOLOFT), valACYclovir (VALTREX) as needed for fever blisters   Do not wear jewelry, make-up or nail polish.  Do not wear lotions, powders, or perfumes. You may wear deodorant.  Do not shave 48 hours prior to surgery. Men may shave face and neck.  Do not bring valuables to the hospital.  [facility] is not responsible  for any belongings or valuables.               Contacts, dentures or bridgework may not be worn into surgery.  Leave suitcase in the car. After surgery it may be brought to your room.  For patients admitted to the hospital, discharge time is determined by your treatment team.               Patients discharged the day of surgery will not be allowed to drive home.  Name and phone number of your driver:   Special Instructions: Shower using CHG 2 nights before surgery and the night before surgery.  If you shower the day of surgery use CHG.  Use special wash - you have one bottle of CHG for all showers.  You should use approximately 1/3 of the bottle for each shower.   Please read over the following fact sheets that you were given: Pain Booklet, Coughing and Deep Breathing, Blood Transfusion Information and Surgical Site Infection Prevention

## 2013-02-13 ENCOUNTER — Encounter (HOSPITAL_COMMUNITY): Payer: Self-pay

## 2013-02-13 NOTE — Progress Notes (Signed)
Anesthesia Chart Review:  Patient is a 72 year old female scheduled for right total shoulder arthroplasty on 02/16/13 by Dr. Ophelia Charter.  Procedure was postponed from 01/09/13 due to an increase in her Cr, so Dr. Ophelia Charter had her re-evaluated by her PCP prior to rescheduling.  History includes obesity, non-smoker, post-operative N/V, HTN, DM2, anxiety, depression, GERD, hiatal hernia, arthritis, anemia, hospitalization for right perihilar/RLL PNA and N/V/D 01/29/13 and 02/03/13 [facility]).  PCP is Dr. Selinda Flavin at [facility] in [place], whom she reportedly saw on 02/10/13. Patient said that Dr. Dimas Aguas is aware of plans for surgery on Monday. He is in the office today, so I did speak with his office staff to ensure he was aware of plans for surgery and left my office number.    I was not asked to evaluated patient during her PAT visit, but I did call and speak with her this morning.  She completed Levaquin on 02/09/13.  She denies fever, SOB, chest pain.  She is using her spirometer.  She has not required nebulizers/inhalers.  She has an occasional productive cough just a few times a day.  She had had no further N/V.  She ony had a very small BM yesterday, so she started Colace last night.    EKG on 01/29/13 showed SR, borderline LAD, abnormal r wave progression, early transition, baseline wanderer in inferior leads.    CXR on 02/01/13 [facility]) showed suspected increased in RLL heterogeneous airspace opacities worrisome for progression of infection.  Slight worsening of left infrahilar atelectasis.   Preoperative labs noted.  WBC WNL.  I have updated anesthesiologist Dr. Jacklynn Bue.  Patient will be a clinical correlation on the day of surgery.  Will determine need for repeat CXR at that time based on exam findings.  I have also notified Sherrie at Dr. Ophelia Charter' office.  She will review with him for additional recommendations, if any.  Velna Ochs [facility]/Anesthesiology Phone [phone] 02/13/2013 3:02 PM

## 2013-02-13 NOTE — Progress Notes (Signed)
Message left regarding time change on Monday, 02/16/13, to 1145. Pt informed to arrive at 0945 and was asked to call back to verify that she received message.

## 2013-02-13 NOTE — H&P (Signed)
[name] [name] is an 72 y.o. female.   Chief Complaint: right shoulder pain HPI: persistent right shoulder pain secondary to osteoarthritis for one year.  She has had right shoulder arthroscopy with debridement of SLAP tear and biceps tendon tear in February 2014.  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  Loose bodies were removed. She has had intra-articular injection , PT and activity modification without adequate relief of symptoms. Currently with difficulty performing ADLS.   Radiographs show marginal osteophytes,subchondral sclerosis of the humeral head and glenoid osteophytes as well as [place] joint degenerative changes. Pt wishes to proceed with a total shoulder arthroplasty.  Past Medical History  Diagnosis Date  . PONV (postoperative nausea and vomiting)   . Hypertension   . Depression   . Seasonal allergies   . Pneumonia   . Diabetes mellitus without complication   . Anxiety   . GERD (gastroesophageal reflux disease)   . H/O hiatal hernia   . Arthritis   . Anemia     Past Surgical History  Procedure Laterality Date  . Back surgery      lumbar x2 by Dr [name] [name]  . Tonsillectomy    . Appendectomy    . Abdominal hysterectomy    . Cholecystectomy    . Joint replacement Right 1995    hip  . Joint replacement Left 2008    hip  . Medial partial knee replacement Left     Dr [name] [name]  . Shoulder arthroscopy Right   . Shoulder arthroscopy Right     x2  . Cardiac catheterization  1990's    "okay"    No family history on file. Social History:  reports that she has never smoked. She has never used smokeless tobacco. She reports that she does not drink alcohol or use illicit drugs.  Allergies:  Allergies  Allergen Reactions  . Novocain [Procaine Hcl]     unknown  . Other Hives    EKG pads - need to use pediatric pads  . Penicillins Rash  . Sulfa Antibiotics Rash    No prescriptions  prior to admission    Results for orders placed during the hospital encounter of 02/12/13 (from the past 48 hour(s))  CBC     Status: Abnormal   Collection Time    02/12/13  2:23 PM      Result Value Range   WBC 9.3  4.0 - 10.5 K/uL   RBC 4.21  3.87 - 5.11 MIL/uL   Hemoglobin 11.6 (*) 12.0 - 15.0 g/dL   HCT 78.2  95.6 - 21.3 %   MCV 85.7  78.0 - 100.0 fL   MCH 27.6  26.0 - 34.0 pg   MCHC 32.1  30.0 - 36.0 g/dL   RDW 08.6  57.8 - 46.9 %   Platelets 267  150 - 400 K/uL  COMPREHENSIVE METABOLIC PANEL     Status: Abnormal   Collection Time    02/12/13  2:23 PM      Result Value Range   Sodium 138  135 - 145 mEq/L   Potassium 3.6  3.5 - 5.1 mEq/L   Chloride 103  96 - 112 mEq/L   CO2 25  19 - 32 mEq/L   Glucose, Bld 117 (*) 70 - 99 mg/dL   BUN 23  6 - 23 mg/dL   Creatinine, Ser 6.29 (*) 0.50 - 1.10 mg/dL   Calcium 8.4  8.4 -  10.5 mg/dL   Total Protein 6.9  6.0 - 8.3 g/dL   Albumin 3.6  3.5 - 5.2 g/dL   AST 11  0 - 37 U/L   ALT 9  0 - 35 U/L   Alkaline Phosphatase 135 (*) 39 - 117 U/L   Total Bilirubin 0.3  0.3 - 1.2 mg/dL   GFR calc non Af Amer 46 (*) >90 mL/min   GFR calc Af Amer 53 (*) >90 mL/min   Comment: (NOTE)     The eGFR has been calculated using the CKD EPI equation.     This calculation has not been validated in all clinical situations.     eGFR's persistently <90 mL/min signify possible Chronic Kidney     Disease.  URINALYSIS, ROUTINE W REFLEX MICROSCOPIC     Status: Abnormal   Collection Time    02/12/13  2:23 PM      Result Value Range   Color, Urine YELLOW  YELLOW   APPearance CLEAR  CLEAR   Specific Gravity, Urine 1.028  1.005 - 1.030   pH 5.5  5.0 - 8.0   Glucose, UA NEGATIVE  NEGATIVE mg/dL   Hgb urine dipstick NEGATIVE  NEGATIVE   Bilirubin Urine SMALL (*) NEGATIVE   Ketones, ur NEGATIVE  NEGATIVE mg/dL   Protein, ur NEGATIVE  NEGATIVE mg/dL   Urobilinogen, UA 0.2  0.0 - 1.0 mg/dL   Nitrite NEGATIVE  NEGATIVE   Leukocytes, UA NEGATIVE  NEGATIVE    Comment: MICROSCOPIC NOT DONE ON URINES WITH NEGATIVE PROTEIN, BLOOD, LEUKOCYTES, NITRITE, OR GLUCOSE <1000 mg/dL.  TYPE AND SCREEN     Status: None   Collection Time    02/12/13  2:23 PM      Result Value Range   ABO/RH(D) A POS     Antibody Screen NEG     Sample Expiration 02/26/2013    SURGICAL PCR SCREEN     Status: None   Collection Time    02/12/13  2:36 PM      Result Value Range   MRSA, PCR NEGATIVE  NEGATIVE   Staphylococcus aureus NEGATIVE  NEGATIVE   Comment:            The Xpert SA Assay (FDA     approved for NASAL specimens     in patients over 91 years of age),     is one component of     a comprehensive surveillance     program.  Test performance has     been validated by [employer] for patients greater     than or equal to 30 year old.     It is not intended     to diagnose infection nor to     guide or monitor treatment.   No results found.  Review of Systems  Musculoskeletal: Positive for joint pain.       Right shoulder pain with movement and ADLs  All other systems reviewed and are negative.    There were no vitals taken for this visit. Physical Exam  Constitutional: She is oriented to person, place, and time. She appears well-developed and well-nourished.  HENT:  Head: Normocephalic and atraumatic.  Eyes: EOM are normal. Pupils are equal, round, and reactive to light.  Neck: Normal range of motion.  Cardiovascular: Normal rate.   Respiratory: Effort normal.  GI: Soft.  Musculoskeletal:  Painful ROM right shoulder.  Flexion to 130 degrees, abduction to 135 degrees. Internal and external  movement painful. Distal pulse and sensation intact.  Neurological: She is alert and oriented to person, place, and time.  Skin: Skin is warm and dry.  Psychiatric: She has a normal mood and affect.     Assessment/Plan Right shoulder osteoarthritis  PLAN:  Right total shoulder replacement.  [name] [name] M 02/13/2013, 12:46 PM

## 2013-02-15 MED ORDER — CLINDAMYCIN PHOSPHATE 900 MG/50ML IV SOLN
900.0000 mg | INTRAVENOUS | Status: AC
  Administered 2013-02-16: 900 mg via INTRAVENOUS
  Filled 2013-02-15 (×2): qty 50

## 2013-02-16 ENCOUNTER — Inpatient Hospital Stay (HOSPITAL_COMMUNITY)
Admission: RE | Admit: 2013-02-16 | Discharge: 2013-02-18 | DRG: 483 | Disposition: A | Discharge status: Home / Self Care | Payer: Medicare Other | Source: Ambulatory Visit | Attending: Orthopaedic Surgery | Admitting: Orthopaedic Surgery

## 2013-02-16 ENCOUNTER — Inpatient Hospital Stay (HOSPITAL_COMMUNITY): Payer: Medicare Other | Admitting: Anesthesiology

## 2013-02-16 ENCOUNTER — Encounter (HOSPITAL_COMMUNITY)
Admission: RE | Disposition: A | Discharge status: Home / Self Care | Payer: Self-pay | Source: Ambulatory Visit | Attending: Orthopaedic Surgery

## 2013-02-16 ENCOUNTER — Encounter (HOSPITAL_COMMUNITY): Payer: Self-pay | Admitting: *Deleted

## 2013-02-16 ENCOUNTER — Encounter (HOSPITAL_COMMUNITY): Payer: Medicare Other | Admitting: Vascular Surgery

## 2013-02-16 DIAGNOSIS — Z602 Problems related to living alone: Secondary | ICD-10-CM

## 2013-02-16 DIAGNOSIS — F411 Generalized anxiety disorder: Secondary | ICD-10-CM | POA: Diagnosis present

## 2013-02-16 DIAGNOSIS — M19019 Primary osteoarthritis, unspecified shoulder: Secondary | ICD-10-CM | POA: Diagnosis not present

## 2013-02-16 DIAGNOSIS — K219 Gastro-esophageal reflux disease without esophagitis: Secondary | ICD-10-CM | POA: Diagnosis present

## 2013-02-16 DIAGNOSIS — Z96649 Presence of unspecified artificial hip joint: Secondary | ICD-10-CM

## 2013-02-16 DIAGNOSIS — Z880 Allergy status to penicillin: Secondary | ICD-10-CM

## 2013-02-16 DIAGNOSIS — M25519 Pain in unspecified shoulder: Secondary | ICD-10-CM | POA: Diagnosis not present

## 2013-02-16 DIAGNOSIS — M19011 Primary osteoarthritis, right shoulder: Secondary | ICD-10-CM | POA: Diagnosis present

## 2013-02-16 DIAGNOSIS — G8918 Other acute postprocedural pain: Secondary | ICD-10-CM | POA: Diagnosis not present

## 2013-02-16 DIAGNOSIS — I10 Essential (primary) hypertension: Secondary | ICD-10-CM | POA: Diagnosis not present

## 2013-02-16 DIAGNOSIS — F329 Major depressive disorder, single episode, unspecified: Secondary | ICD-10-CM | POA: Diagnosis present

## 2013-02-16 DIAGNOSIS — Z9089 Acquired absence of other organs: Secondary | ICD-10-CM

## 2013-02-16 DIAGNOSIS — Z882 Allergy status to sulfonamides status: Secondary | ICD-10-CM | POA: Diagnosis not present

## 2013-02-16 DIAGNOSIS — F3289 Other specified depressive episodes: Secondary | ICD-10-CM | POA: Diagnosis present

## 2013-02-16 DIAGNOSIS — E119 Type 2 diabetes mellitus without complications: Secondary | ICD-10-CM | POA: Diagnosis present

## 2013-02-16 HISTORY — PX: TOTAL SHOULDER ARTHROPLASTY: SHX126

## 2013-02-16 LAB — GLUCOSE, CAPILLARY: Glucose-Capillary: 128 mg/dL — ABNORMAL HIGH (ref 70–99)

## 2013-02-16 SURGERY — ARTHROPLASTY, SHOULDER, TOTAL
Anesthesia: General | Site: Shoulder | Laterality: Right

## 2013-02-16 MED ORDER — BUPIVACAINE HCL (PF) 0.25 % IJ SOLN
INTRAMUSCULAR | Status: AC
  Filled 2013-02-16: qty 30

## 2013-02-16 MED ORDER — NEOSTIGMINE METHYLSULFATE 1 MG/ML IJ SOLN
INTRAMUSCULAR | Status: DC | PRN
  Administered 2013-02-16: 4 mg via INTRAVENOUS

## 2013-02-16 MED ORDER — KETOROLAC TROMETHAMINE 30 MG/ML IJ SOLN
30.0000 mg | Freq: Four times a day (QID) | INTRAMUSCULAR | Status: AC
  Administered 2013-02-16 – 2013-02-17 (×4): 30 mg via INTRAVENOUS
  Filled 2013-02-16 (×4): qty 1

## 2013-02-16 MED ORDER — FENTANYL CITRATE 0.05 MG/ML IJ SOLN
INTRAMUSCULAR | Status: AC
  Filled 2013-02-16: qty 2

## 2013-02-16 MED ORDER — MIDAZOLAM HCL 2 MG/2ML IJ SOLN
INTRAMUSCULAR | Status: AC
  Filled 2013-02-16: qty 2

## 2013-02-16 MED ORDER — ROCURONIUM BROMIDE 100 MG/10ML IV SOLN
INTRAVENOUS | Status: DC | PRN
  Administered 2013-02-16: 50 mg via INTRAVENOUS

## 2013-02-16 MED ORDER — LACTATED RINGERS IV SOLN
INTRAVENOUS | Status: DC
  Administered 2013-02-16: 10:00:00 via INTRAVENOUS

## 2013-02-16 MED ORDER — ONDANSETRON HCL 4 MG PO TABS: 4.0000 mg | ORAL_TABLET | Freq: Four times a day (QID) | ORAL | Status: DC | PRN

## 2013-02-16 MED ORDER — GLYCOPYRROLATE 0.2 MG/ML IJ SOLN
INTRAMUSCULAR | Status: DC | PRN
  Administered 2013-02-16: 0.2 mg via INTRAVENOUS
  Administered 2013-02-16: 0.6 mg via INTRAVENOUS

## 2013-02-16 MED ORDER — SODIUM CHLORIDE 0.9 % IR SOLN
Status: DC | PRN
  Administered 2013-02-16: 1000 mL

## 2013-02-16 MED ORDER — FENTANYL CITRATE 0.05 MG/ML IJ SOLN
INTRAMUSCULAR | Status: DC | PRN
  Administered 2013-02-16: 50 ug via INTRAVENOUS
  Administered 2013-02-16: 100 ug via INTRAVENOUS
  Administered 2013-02-16: 50 ug via INTRAVENOUS

## 2013-02-16 MED ORDER — LIDOCAINE HCL (CARDIAC) 20 MG/ML IV SOLN: INTRAVENOUS | Status: DC | PRN

## 2013-02-16 MED ORDER — PROMETHAZINE HCL 25 MG/ML IJ SOLN: 6.2500 mg | INTRAMUSCULAR | Status: DC | PRN

## 2013-02-16 MED ORDER — DEXAMETHASONE SODIUM PHOSPHATE 10 MG/ML IJ SOLN
INTRAMUSCULAR | Status: DC | PRN
  Administered 2013-02-16: 6 mg

## 2013-02-16 MED ORDER — BUPIVACAINE-EPINEPHRINE PF 0.5-1:200000 % IJ SOLN
INTRAMUSCULAR | Status: DC | PRN
  Administered 2013-02-16: 150 mg via PERINEURAL

## 2013-02-16 MED ORDER — ONDANSETRON HCL 4 MG/2ML IJ SOLN
INTRAMUSCULAR | Status: DC | PRN
  Administered 2013-02-16: 4 mg via INTRAVENOUS

## 2013-02-16 MED ORDER — TRAZODONE HCL 50 MG PO TABS
50.0000 mg | ORAL_TABLET | Freq: Every day | ORAL | Status: DC
  Administered 2013-02-16 – 2013-02-17 (×2): 50 mg via ORAL
  Filled 2013-02-16 (×3): qty 1

## 2013-02-16 MED ORDER — MIDAZOLAM HCL 5 MG/5ML IJ SOLN
INTRAMUSCULAR | Status: DC | PRN
  Administered 2013-02-16: 1 mg via INTRAVENOUS

## 2013-02-16 MED ORDER — ZOLPIDEM TARTRATE 5 MG PO TABS: 5.0000 mg | ORAL_TABLET | Freq: Every evening | ORAL | Status: DC | PRN

## 2013-02-16 MED ORDER — DIAZEPAM 5 MG PO TABS: 5.0000 mg | ORAL_TABLET | Freq: Three times a day (TID) | ORAL | Status: DC | PRN

## 2013-02-16 MED ORDER — ACETAMINOPHEN 325 MG PO TABS: 650.0000 mg | ORAL_TABLET | Freq: Four times a day (QID) | ORAL | Status: DC | PRN

## 2013-02-16 MED ORDER — MENTHOL 3 MG MT LOZG: 1.0000 | LOZENGE | OROMUCOSAL | Status: DC | PRN

## 2013-02-16 MED ORDER — OXYCODONE-ACETAMINOPHEN 5-325 MG PO TABS: 1.0000 | ORAL_TABLET | ORAL | Status: DC | PRN

## 2013-02-16 MED ORDER — KCL IN DEXTROSE-NACL 20-5-0.45 MEQ/L-%-% IV SOLN
INTRAVENOUS | Status: DC
  Filled 2013-02-16 (×5): qty 1000

## 2013-02-16 MED ORDER — ONDANSETRON HCL 4 MG/2ML IJ SOLN: 4.0000 mg | Freq: Four times a day (QID) | INTRAMUSCULAR | Status: DC | PRN

## 2013-02-16 MED ORDER — METHOCARBAMOL 500 MG PO TABS
500.0000 mg | ORAL_TABLET | Freq: Four times a day (QID) | ORAL | Status: DC | PRN
  Administered 2013-02-17 – 2013-02-18 (×2): 500 mg via ORAL
  Filled 2013-02-16 (×3): qty 1

## 2013-02-16 MED ORDER — SODIUM CHLORIDE 0.9 % IV SOLN
10.0000 mg | INTRAVENOUS | Status: DC | PRN
  Administered 2013-02-16: 50 ug/min via INTRAVENOUS

## 2013-02-16 MED ORDER — OXYCODONE HCL 5 MG/5ML PO SOLN: 5.0000 mg | Freq: Once | ORAL | Status: DC | PRN

## 2013-02-16 MED ORDER — ACETAMINOPHEN 650 MG RE SUPP: 650.0000 mg | Freq: Four times a day (QID) | RECTAL | Status: DC | PRN

## 2013-02-16 MED ORDER — SENNOSIDES-DOCUSATE SODIUM 8.6-50 MG PO TABS: 1.0000 | ORAL_TABLET | Freq: Every evening | ORAL | Status: DC | PRN

## 2013-02-16 MED ORDER — METOCLOPRAMIDE HCL 5 MG/ML IJ SOLN: 5.0000 mg | Freq: Three times a day (TID) | INTRAMUSCULAR | Status: DC | PRN

## 2013-02-16 MED ORDER — LACTATED RINGERS IV SOLN
INTRAVENOUS | Status: DC | PRN
  Administered 2013-02-16 (×2): via INTRAVENOUS

## 2013-02-16 MED ORDER — DIPHENHYDRAMINE HCL 25 MG PO TABS
25.0000 mg | ORAL_TABLET | Freq: Every day | ORAL | Status: DC | PRN
  Filled 2013-02-16: qty 1

## 2013-02-16 MED ORDER — METOCLOPRAMIDE HCL 10 MG PO TABS: 5.0000 mg | ORAL_TABLET | Freq: Three times a day (TID) | ORAL | Status: DC | PRN

## 2013-02-16 MED ORDER — MORPHINE SULFATE 2 MG/ML IJ SOLN: 1.0000 mg | INTRAMUSCULAR | Status: DC | PRN

## 2013-02-16 MED ORDER — OXYCODONE HCL 5 MG PO TABS: 5.0000 mg | ORAL_TABLET | Freq: Once | ORAL | Status: DC | PRN

## 2013-02-16 MED ORDER — PANTOPRAZOLE SODIUM 40 MG PO TBEC
40.0000 mg | DELAYED_RELEASE_TABLET | Freq: Every day | ORAL | Status: DC
  Administered 2013-02-17 – 2013-02-18 (×2): 40 mg via ORAL
  Filled 2013-02-16 (×2): qty 1

## 2013-02-16 MED ORDER — ARTIFICIAL TEARS OP OINT
TOPICAL_OINTMENT | OPHTHALMIC | Status: DC | PRN
  Administered 2013-02-16: 1 via OPHTHALMIC

## 2013-02-16 MED ORDER — PHENOL 1.4 % MT LIQD: 1.0000 | OROMUCOSAL | Status: DC | PRN

## 2013-02-16 MED ORDER — METHOCARBAMOL 100 MG/ML IJ SOLN: 500.0000 mg | Freq: Four times a day (QID) | INTRAVENOUS | Status: DC | PRN

## 2013-02-16 MED ORDER — FLEET ENEMA 7-19 GM/118ML RE ENEM: 1.0000 | ENEMA | Freq: Once | RECTAL | Status: AC | PRN

## 2013-02-16 MED ORDER — BUPIVACAINE-EPINEPHRINE 0.5% -1:200000 IJ SOLN
INTRAMUSCULAR | Status: DC | PRN
  Administered 2013-02-16: 20 mL

## 2013-02-16 MED ORDER — METHOCARBAMOL 500 MG PO TABS: 500.0000 mg | ORAL_TABLET | Freq: Four times a day (QID) | ORAL | Status: DC | PRN

## 2013-02-16 MED ORDER — BISACODYL 10 MG RE SUPP
10.0000 mg | Freq: Every day | RECTAL | Status: DC | PRN
  Administered 2013-02-17: 10 mg via RECTAL
  Filled 2013-02-16: qty 1

## 2013-02-16 MED ORDER — SERTRALINE HCL 100 MG PO TABS
100.0000 mg | ORAL_TABLET | Freq: Every day | ORAL | Status: DC
  Administered 2013-02-17 – 2013-02-18 (×2): 100 mg via ORAL
  Filled 2013-02-16 (×2): qty 1

## 2013-02-16 MED ORDER — POTASSIUM CHLORIDE ER 10 MEQ PO TBCR
10.0000 meq | EXTENDED_RELEASE_TABLET | Freq: Three times a day (TID) | ORAL | Status: DC
  Administered 2013-02-16 – 2013-02-18 (×5): 10 meq via ORAL
  Filled 2013-02-16 (×8): qty 1

## 2013-02-16 MED ORDER — DOCUSATE SODIUM 100 MG PO CAPS
100.0000 mg | ORAL_CAPSULE | Freq: Two times a day (BID) | ORAL | Status: DC
  Administered 2013-02-16 – 2013-02-18 (×5): 100 mg via ORAL
  Filled 2013-02-16 (×6): qty 1

## 2013-02-16 MED ORDER — PROPOFOL 10 MG/ML IV BOLUS
INTRAVENOUS | Status: DC | PRN
  Administered 2013-02-16: 200 mg via INTRAVENOUS

## 2013-02-16 MED ORDER — HYDROMORPHONE HCL PF 1 MG/ML IJ SOLN: 0.2500 mg | INTRAMUSCULAR | Status: DC | PRN

## 2013-02-16 MED ORDER — HYDROCODONE-ACETAMINOPHEN 5-325 MG PO TABS
1.0000 | ORAL_TABLET | ORAL | Status: DC | PRN
  Administered 2013-02-17 (×2): 1 via ORAL
  Administered 2013-02-17 – 2013-02-18 (×2): 2 via ORAL
  Filled 2013-02-16 (×2): qty 1
  Filled 2013-02-16 (×2): qty 2

## 2013-02-16 MED ORDER — OXYCODONE-ACETAMINOPHEN 5-325 MG PO TABS
1.0000 | ORAL_TABLET | ORAL | Status: DC | PRN
  Administered 2013-02-17 – 2013-02-18 (×3): 2 via ORAL
  Filled 2013-02-16 (×3): qty 2

## 2013-02-16 SURGICAL SUPPLY — 66 items
ADH SKN CLS LQ APL DERMABOND (GAUZE/BANDAGES/DRESSINGS) ×1
BLADE SAW SAG 29X58X.64 (BLADE) ×2 IMPLANT
BOWL SMART MIX CTS (DISPOSABLE) IMPLANT
CEMENT BONE DEPUY (Cement) ×1 IMPLANT
CLOTH BEACON ORANGE TIMEOUT ST (SAFETY) ×2 IMPLANT
COVER MAYO STAND STRL (DRAPES) ×4 IMPLANT
COVER SURGICAL LIGHT HANDLE (MISCELLANEOUS) ×2 IMPLANT
COVER TABLE BACK 60X90 (DRAPES) ×2 IMPLANT
DERMABOND ADHESIVE PROPEN (GAUZE/BANDAGES/DRESSINGS) ×1
DERMABOND ADVANCED .7 DNX6 (GAUZE/BANDAGES/DRESSINGS) IMPLANT
DRAPE C-ARM 42X72 X-RAY (DRAPES) IMPLANT
DRAPE INCISE IOBAN 66X45 STRL (DRAPES) ×2 IMPLANT
DRAPE U-SHAPE 47X51 STRL (DRAPES) ×2 IMPLANT
DRSG ADAPTIC 3X8 NADH LF (GAUZE/BANDAGES/DRESSINGS) ×2 IMPLANT
DRSG MEPILEX BORDER 4X12 (GAUZE/BANDAGES/DRESSINGS) ×1 IMPLANT
DRSG PAD ABDOMINAL 8X10 ST (GAUZE/BANDAGES/DRESSINGS) ×4 IMPLANT
DURAPREP 26ML APPLICATOR (WOUND CARE) ×2 IMPLANT
ELECT BLADE 6.5 EXT (BLADE) IMPLANT
ELECT NDL TIP 2.8 STRL (NEEDLE) IMPLANT
ELECT NEEDLE TIP 2.8 STRL (NEEDLE) IMPLANT
ELECT REM PT RETURN 9FT ADLT (ELECTROSURGICAL) ×2
ELECTRODE REM PT RTRN 9FT ADLT (ELECTROSURGICAL) ×1 IMPLANT
EVACUATOR 1/8 PVC DRAIN (DRAIN) IMPLANT
FACESHIELD LNG OPTICON STERILE (SAFETY) IMPLANT
GLENOID ANCHOR PEG CROSSLK 40 (Orthopedic Implant) ×1 IMPLANT
GLOVE BIOGEL PI IND STRL 7.5 (GLOVE) ×1 IMPLANT
GLOVE BIOGEL PI IND STRL 8 (GLOVE) ×1 IMPLANT
GLOVE BIOGEL PI INDICATOR 7.5 (GLOVE) ×1
GLOVE BIOGEL PI INDICATOR 8 (GLOVE) ×1
GLOVE ECLIPSE 7.0 STRL STRAW (GLOVE) ×2 IMPLANT
GLOVE ORTHO TXT STRL SZ7.5 (GLOVE) ×2 IMPLANT
GOWN PREVENTION PLUS LG XLONG (DISPOSABLE) IMPLANT
GOWN STRL NON-REIN LRG LVL3 (GOWN DISPOSABLE) ×4 IMPLANT
HEAD HUM ECCENTRIC 44X18 STRL (Trauma) ×1 IMPLANT
KIT BASIN OR (CUSTOM PROCEDURE TRAY) ×2 IMPLANT
KIT ROOM TURNOVER OR (KITS) ×2 IMPLANT
MANIFOLD NEPTUNE II (INSTRUMENTS) ×2 IMPLANT
NDL HYPO 25GX1X1/2 BEV (NEEDLE) ×1 IMPLANT
NDL SUT 6 .5 CRC .975X.05 MAYO (NEEDLE) ×1 IMPLANT
NEEDLE HYPO 25GX1X1/2 BEV (NEEDLE) ×2 IMPLANT
NEEDLE MAYO TAPER (NEEDLE) ×2
NS IRRIG 1000ML POUR BTL (IV SOLUTION) ×2 IMPLANT
PACK SHOULDER (CUSTOM PROCEDURE TRAY) ×2 IMPLANT
PAD ARMBOARD 7.5X6 YLW CONV (MISCELLANEOUS) ×4 IMPLANT
PASSER SUT SWANSON 36MM LOOP (INSTRUMENTS) IMPLANT
PIN METAGLENE 2.5 (PIN) ×1 IMPLANT
SPONGE GAUZE 4X4 12PLY (GAUZE/BANDAGES/DRESSINGS) ×2 IMPLANT
SPONGE LAP 18X18 X RAY DECT (DISPOSABLE) ×2 IMPLANT
STAPLER VISISTAT 35W (STAPLE) ×2 IMPLANT
STEM HUMERAL 12MM (Trauma) ×1 IMPLANT
STRIP CLOSURE SKIN 1/2X4 (GAUZE/BANDAGES/DRESSINGS) ×2 IMPLANT
SUCTION FRAZIER TIP 10 FR DISP (SUCTIONS) ×2 IMPLANT
SUT ETHIBOND NAB CT1 #1 30IN (SUTURE) ×6 IMPLANT
SUT FIBERWIRE #2 38 T-5 BLUE (SUTURE) ×6
SUT VIC AB 0 CT1 27 (SUTURE) ×6
SUT VIC AB 0 CT1 27XBRD ANBCTR (SUTURE) ×2 IMPLANT
SUT VIC AB 2-0 CT1 27 (SUTURE) ×10
SUT VIC AB 2-0 CT1 TAPERPNT 27 (SUTURE) ×3 IMPLANT
SUTURE FIBERWR #2 38 T-5 BLUE (SUTURE) ×1 IMPLANT
SYR CONTROL 10ML LL (SYRINGE) ×2 IMPLANT
SYR TOOMEY 50ML (SYRINGE) IMPLANT
TOWEL OR 17X24 6PK STRL BLUE (TOWEL DISPOSABLE) ×2 IMPLANT
TOWEL OR 17X26 10 PK STRL BLUE (TOWEL DISPOSABLE) ×2 IMPLANT
TRAY FOLEY CATH 16FRSI W/METER (SET/KITS/TRAYS/PACK) IMPLANT
WATER STERILE IRR 1000ML POUR (IV SOLUTION) ×2 IMPLANT
YANKAUER SUCT BULB TIP NO VENT (SUCTIONS) ×2 IMPLANT

## 2013-02-16 NOTE — Transfer of Care (Signed)
Immediate Anesthesia Transfer of Care Note  Patient: [name] [name]  Procedure(s) Performed: Procedure(s) with comments: TOTAL SHOULDER ARTHROPLASTY (Right) - Right Total Shoulder Arthroplasty, Cemented  Patient Location: PACU  Anesthesia Type:GA combined with regional for post-op pain  Level of Consciousness: awake, alert  and oriented  Airway & Oxygen Therapy: Patient Spontanous Breathing and Patient connected to face mask oxygen  Post-op Assessment: Report given to PACU RN  Post vital signs: Reviewed and stable  Complications: No apparent anesthesia complications

## 2013-02-16 NOTE — Op Note (Signed)
Preop diagnosis: Right shoulder osteoarthritis  Postop diagnosis: Same  Procedure: Right total shoulder arthroplasty. Depuy 12 mm humeral stem, 40 mm head cemented glenoid, 44 x 18 mm head.( Humeral side was press fit.)  Surgeon: Annell Greening M.D.  Assistant: Maud Deed PA-C medically necessary and present for the entire procedure  Anesthesia: Gen. LT  EBL: 100 cc  Drains: None  Complications: None  Procedure: After induction general anesthesia and prophylaxis with antibiotics using clindamycin to the patient's penicillin allergy patient was placed in the shoulder frame beachchair position head carefully adjusted for her thoracic kyphosis and increased cervical lordosis. Sure was prepped with DuraPrep the wrist usual impervious stockinette split sheets drapes sterile skin marker Betadine Steri-Drape  were applied. Timeout procedure was completed. Pectoral incision was made. Cephalic vein was extremely [place] was left. Biceps tendon was also relatively small. Subscap was identified divided and combination #1 Ethibond and #2 fiber wire sutures were placed for later repair at the end of the case. Capsule was opened with the subscapularis leaving a 1 cm cuff laterally for repair. After around curved protectors were placed have for protection of soft tissue oscillating saw was used to cut with 25-30 of retroversion. There were some a remaining posterior superior spurs which are turned off the bare [place]. Cut was able to greater tuberosity. Canal hand reaming up to 12 was performed then the 12 got tight [place] 14 and then the trial sizers for height restoration based on the resected humeral head fragment. Glenoid was prepared once it was exposed and central pin was placed. Original slightly inferior second temperature was slightly closer the 2:00 position it was repositioned exactly Center Center reaming took place in and sizing for 40 mm was performed. Settles rainfall by the additional holes.,  Reamed and good subchondral bone was some bleeding. All a holes were checked with the small sucker tip and more all contained in bone. Cement was vacuum mixed place and the smaller holes first followed by the Center hole and then the compression of the 40 mm polyethylene component. 2 pegs low with a high and Center exactly placed impacted in place there was extremely tight fit in length the trial component minutes the foot fit flush with the bone. Tricomponent was difficult to remove and reliever with an osteotome due to the solid sclerotic bone present with the patient's long-standing osteoarthritis and new subchondral bone formation. No tenderness is maybe curetted. Sonnett was held with the impactor after packing down with the hammer toe cement was hard at 15 minutes. Humerus was then prepared as described above and trials were performed and all excessive cement had been removed from the glenoid. From the stem was in place based on the trials. The Ecentric humeral head was placed and the head was down slightly posterior and superior so that it would be flush with the cut surface than flush with the top of the greater tuberosity. Escaped the head from being too high or too low and gave complete coverage of the resected bone surface.  There is excellent fit of the permanent stem patient had excellent test of bone and no rotational instability. He centered permanent head was popped on identical findings and good stability. Subscap was repaired rotator cuff intervals.  Rotator cuff was intact patient had slight thinning of the supraspinatus. Good range of motion was present without dislocation at 90 of abduction 90 of external rotation. Subtendinous tissue was reapproximated by followed by subcuticular skin closure Dermabond postop dressing and transferred the  current. Instrument count needle count was correct. Signed Annell Greening M.D.

## 2013-02-16 NOTE — Interval H&P Note (Signed)
History and Physical Interval Note:  02/16/2013 11:53 AM  [name] [name]  has presented today for surgery, with the diagnosis of Right Shoulder Osteoarthritis  The various methods of treatment have been discussed with the patient and family. After consideration of risks, benefits and other options for treatment, the patient has consented to  Procedure(s) with comments: TOTAL SHOULDER ARTHROPLASTY (Right) - Right Total Shoulder Arthroplasty, Cemented as a surgical intervention .  The patient's history has been reviewed, patient examined, no change in status, stable for surgery.  I have reviewed the patient's chart and labs.  Questions were answered to the patient's satisfaction.     YATES,MARK C

## 2013-02-16 NOTE — Anesthesia Procedure Notes (Addendum)
Anesthesia Regional Block:  Interscalene brachial plexus block  Pre-Anesthetic Checklist: ,, timeout performed, Correct Patient, Correct Site, Correct Laterality, Correct Procedure, Correct Position, site marked, Risks and benefits discussed,  Surgical consent,  Pre-op evaluation,  At surgeon's request and post-op pain management  Laterality: Left  Prep: chloraprep       Needles:  Injection technique: Single-shot  Needle Type: Echogenic Stimulator Needle     Needle Length: 5cm 5 cm Needle Gauge: 22 and 22 G    Additional Needles:  Procedures: ultrasound guided (picture in chart) and nerve stimulator Interscalene brachial plexus block  Nerve Stimulator or Paresthesia:  Response: bicep contraction, 0.45 mA,   Additional Responses:   Narrative:  Start time: 02/16/2013 10:58 AM End time: 02/16/2013 11:06 AM Injection made incrementally with aspirations every 5 mL.  Performed by: Personally  Anesthesiologist: J. Adonis Huguenin, MD  Additional Notes: Functioning IV was confirmed and monitors applied.  A 50mm 22ga echogenic arrow stimulator was used. Sterile prep and drape,hand hygiene and sterile gloves were used.Ultrasound guidance: relevant anatomy identified, needle position confirmed, local anesthetic spread visualized around nerve(s)., vascular puncture avoided.  Image printed for medical record.  Negative aspiration and negative test dose prior to incremental administration of local anesthetic. The patient tolerated the procedure well.   Procedure Name: Intubation Date/Time: 02/16/2013 12:15 PM Performed by: Carmela Rima Pre-anesthesia Checklist: Patient identified, Emergency Drugs available, Timeout performed, Suction available and Patient being monitored Patient Re-evaluated:Patient Re-evaluated prior to inductionOxygen Delivery Method: Circle system utilized Preoxygenation: Pre-oxygenation with 100% oxygen Intubation Type: IV induction Ventilation: Mask ventilation  without difficulty Laryngoscope Size: Mac and 3 Grade View: Grade I Tube type: Oral Tube size: 7.5 mm Number of attempts: 1 Placement Confirmation: positive ETCO2,  ETT inserted through vocal cords under direct vision and breath sounds checked- equal and bilateral Secured at: 21 cm Tube secured with: Tape Dental Injury: Teeth and Oropharynx as per pre-operative assessment

## 2013-02-16 NOTE — Preoperative (Signed)
Beta Blockers   Reason not to administer Beta Blockers:Not Applicable 

## 2013-02-16 NOTE — Anesthesia Postprocedure Evaluation (Signed)
Anesthesia Post Note  Patient: [name] [name]  Procedure(s) Performed: Procedure(s) (LRB): TOTAL SHOULDER ARTHROPLASTY (Right)  Anesthesia type: General  Patient location: PACU  Post pain: Pain level controlled  Post assessment: Patient's Cardiovascular Status Stable  Last Vitals:  Filed Vitals:   02/16/13 1514  BP: 130/53  Pulse: 49  Temp: 36.3 C  Resp: 21    Post vital signs: Reviewed and stable  Level of consciousness: alert  Complications: No apparent anesthesia complications

## 2013-02-16 NOTE — Progress Notes (Signed)
Orthopedic Tech Progress Note Patient Details:  [name] [name] Aug 14, 1940 [id number]  Patient ID: [name], female   DOB: 1940/08/12, 72 y.o.   MRN: [id number] Pt is unable to use ohf at this time; rn notified and agrees  Nikki Dom 02/16/2013, 5:08 PM

## 2013-02-16 NOTE — Brief Op Note (Cosign Needed)
02/16/2013  2:41 PM  PATIENT:  [name] [name]  72 y.o. female  PRE-OPERATIVE DIAGNOSIS:  Right Shoulder Osteoarthritis  POST-OPERATIVE DIAGNOSIS:  Right Shoulder Osteoarthritis  PROCEDURE:  Procedure(s) with comments: TOTAL SHOULDER ARTHROPLASTY (Right) - Right Total Shoulder Arthroplasty, Cemented  SURGEON:  Surgeon(s) and Role:    * Eldred Manges, MD - Primary  PHYSICIAN ASSISTANT: Maud Deed [facility]  ASSISTANTS: none   ANESTHESIA:   local and general  EBL:  Total I/O In: 1000 [I.V.:1000] Out: 125 [Blood:125]  BLOOD ADMINISTERED:none  DRAINS: none   LOCAL MEDICATIONS USED:  MARCAINE     SPECIMEN:  No Specimen  DISPOSITION OF SPECIMEN:  N/A  COUNTS:  YES  TOURNIQUET:  * No tourniquets in log *  DICTATION: .Note written in EPIC  PLAN OF CARE: Admit to inpatient   PATIENT DISPOSITION:  PACU - hemodynamically stable.   Delay start of Pharmacological VTE agent (>24hrs) due to surgical blood loss or risk of bleeding: no

## 2013-02-16 NOTE — Progress Notes (Signed)
Utilization review completed.  

## 2013-02-16 NOTE — Anesthesia Preprocedure Evaluation (Addendum)
Anesthesia Evaluation  Patient identified by MRN, date of birth, ID band Patient awake    Reviewed: Allergy & Precautions, H&P , NPO status , Patient's Chart, lab work & pertinent test results  History of Anesthesia Complications (+) PONV and history of anesthetic complications  Airway Mallampati: II      Dental  (+) Dental Advidsory Given, Edentulous Upper and Poor Dentition   Pulmonary neg pulmonary ROS,    Pulmonary exam normal       Cardiovascular hypertension, Pt. on medications     Neuro/Psych PSYCHIATRIC DISORDERS Anxiety Depression negative neurological ROS     GI/Hepatic hiatal hernia, GERD-  Medicated,  Endo/Other  diabetes, Type 2  Renal/GU negative Renal ROS     Musculoskeletal   Abdominal   Peds  Hematology  (+) anemia ,   Anesthesia Other Findings   Reproductive/Obstetrics                       Anesthesia Physical Anesthesia Plan  ASA: III  Anesthesia Plan: General   Post-op Pain Management:    Induction: Intravenous  Airway Management Planned: Oral ETT  Additional Equipment:   Intra-op Plan:   Post-operative Plan: Extubation in OR  Informed Consent:   Dental Advisory Given  Plan Discussed with: CRNA, Anesthesiologist and Surgeon  Anesthesia Plan Comments:        Anesthesia Quick Evaluation

## 2013-02-17 ENCOUNTER — Encounter (HOSPITAL_COMMUNITY): Payer: Self-pay | Admitting: Orthopedic Surgery

## 2013-02-17 NOTE — Progress Notes (Addendum)
Subjective: 1 Day Post-Op Procedure(s) (LRB): TOTAL SHOULDER ARTHROPLASTY (Right) Patient reports pain as moderate.    Objective: Vital signs in last 24 hours: Temp:  [97.2 F (36.2 C)-98.3 F (36.8 C)] 97.2 F (36.2 C) (12/16 0525) Pulse Rate:  [49-61] 49 (12/16 0525) Resp:  [18-21] 18 (12/16 0525) BP: (104-151)/(52-67) 109/57 mmHg (12/16 0525) SpO2:  [95 %-100 %] 98 % (12/16 0525)  Intake/Output from previous day: 12/15 0701 - 12/16 0700 In: 2260 [P.O.:360; I.V.:1900] Out: 325 [Urine:200; Blood:125] Intake/Output this shift:    No results found for this basename: HGB,  in the last 72 hours No results found for this basename: WBC, RBC, HCT, PLT,  in the last 72 hours No results found for this basename: NA, K, CL, CO2, BUN, CREATININE, GLUCOSE, CALCIUM,  in the last 72 hours No results found for this basename: LABPT, INR,  in the last 72 hours  Neurologically intact  Assessment/Plan: 1 Day Post-Op Procedure(s) (LRB): TOTAL SHOULDER ARTHROPLASTY (Right) D/C IV fluids  SL IV  Lives alone likely home in  [place].   YATES,MARK C 02/17/2013, 7:50 AM

## 2013-02-17 NOTE — Evaluation (Signed)
Occupational Therapy Evaluation Patient Details Name: [name] [name] MRN: [id number] DOB: [birth year] Today's Date: 02/17/2013 Time: 9562-1308 OT Time Calculation (min): 31 min  OT Assessment / Plan / Recommendation History of present illness RTSA   Clinical Impression   This 72 yo female admitted and had above presents to acute OT with decreased AROM of RUE, NWB'ing RUE, RUE is dominant hand all affecting pt's independence with BADLs. Will benefit from at least one more session of OT before D/C'ing home. Called and left message for PA or MD about any other exercises other than elbow-- awaiting response.    OT Assessment  Patient needs continued OT Services    Follow Up Recommendations   (follow up per MD)       Equipment Recommendations  None recommended by OT       Frequency  Min 2X/week    Precautions / Restrictions Precautions Precautions: Shoulder Shoulder Interventions: Off for dressing/bathing/exercises Required Braces or Orthoses: Sling Restrictions Weight Bearing Restrictions: Yes RUE Weight Bearing: Non weight bearing   Pertinent Vitals/Pain 2/10 right shoulder; repositioned    ADL  Eating/Feeding: Set up Where Assessed - Eating/Feeding: Chair Grooming: Set up Where Assessed - Grooming: Unsupported standing Upper Body Bathing: Set up Where Assessed - Upper Body Bathing: Unsupported standing Lower Body Bathing: Set up Where Assessed - Lower Body Bathing: Unsupported sit to stand Upper Body Dressing: Minimal assistance Where Assessed - Upper Body Dressing: Unsupported standing Lower Body Dressing: Minimal assistance Where Assessed - Lower Body Dressing: Unsupported sit to stand Toilet Transfer: Supervision/safety Toilet Transfer Method: Sit to Barista: Regular height toilet Toileting - Clothing Manipulation and Hygiene: Minimal assistance Where Assessed - Engineer, mining and Hygiene: Sit to stand from 3-in-1 or  toilet Equipment Used:  (sling) Transfers/Ambulation Related to ADLs: S for all with SPC    OT Diagnosis: Generalized weakness;Acute pain  OT Problem List: Decreased strength;Decreased range of motion;Pain;Impaired UE functional use OT Treatment Interventions: Self-care/ADL training;Therapeutic exercise;DME and/or AE instruction;Patient/family education   OT Goals(Current goals can be found in the care plan section) Acute Rehab OT Goals Patient Stated Goal: Home tomorrow OT Goal Formulation: With patient Time For Goal Achievement: 02/24/13 Potential to Achieve Goals: Good  Visit Information  Last OT Received On: 02/17/13 Assistance Needed: +1 History of Present Illness: RTSA       Prior Functioning     Home Living Family/patient expects to be discharged to:: Private residence Living Arrangements: Alone Available Help at Discharge: Family;Available PRN/intermittently Type of Home: House Home Access: Stairs to enter [employer] of Steps: 2 Entrance Stairs-Rails: None (uses her [facility]) Home Layout: One level Home Equipment: Grab bars - tub/shower Prior Function Level of Independence: Independent Communication Communication: No difficulties Dominant Hand: Right         Vision/Perception Vision - History Patient Visual Report: No change from baseline   Cognition  Cognition Arousal/Alertness: Awake/alert Behavior During Therapy: WFL for tasks assessed/performed Overall Cognitive Status: Within Functional Limits for tasks assessed    Extremity/Trunk Assessment Upper Extremity Assessment Upper Extremity Assessment: RUE deficits/detail RUE Deficits / Details: This admission for TSA; block has not worn off yet so very little AROM elbow to hand at time RUE Coordination: decreased fine motor;decreased gross motor     Mobility Bed Mobility Bed Mobility: Supine to Sit;Sitting - Scoot to Edge of Bed Supine to Sit: 6: Modified independent (Device/Increase  time);HOB elevated Sitting - Scoot to Edge of Bed: 7: Independent Transfers Transfers: Sit to [employer]  to Sit Sit to Stand: 5: Supervision;With upper extremity assist;From bed (lue) Stand to Sit: 5: Supervision;With upper extremity assist;To chair/3-in-1 (lue)     Exercise Other Exercises Other Exercises: 10 reps of elbow flexion/extension AAROM due to nerve block not totally worn off. Method for sponge bathing under operated UE: Independent Donning/doffing sling/immobilizer: Supervision/safety Correct positioning of sling/immobilizer: Supervision/safety Pendulum exercises (written home exercise program):  (NA at this time) ROM for elbow, wrist and digits of operated UE: Minimal assistance (due to nerve block not worn off) Sling wearing schedule (on at all times/off for ADL's): Independent Dressing change:  (NA for OT) Positioning of UE while sleeping: Independent      End of Session OT - End of Session Equipment Utilized During Treatment:  [facility]) Activity Tolerance: Patient tolerated treatment well Patient left: in chair;with call bell/phone within reach Nurse Communication: Mobility status       Evette Georges [phone] 02/17/2013, 11:46 AM

## 2013-02-17 NOTE — Progress Notes (Signed)
Occupational Therapy Treatment Patient Details Name: [name] [name] MRN: [id number] DOB: Nov 09, 1940 Today's Date: 02/17/2013 Time: 4098-1191 OT Time Calculation (min): 25 min  OT Assessment / Plan / Recommendation  History of present illness RTSA   OT comments  This 72 yo female making progress, will see for one more session in the AM before she D/C's.  Follow Up Recommendations   (per MD)       Equipment Recommendations  None recommended by OT       Frequency Min 2X/week   Progress towards OT Goals Progress towards OT goals: Progressing toward goals  Plan Discharge plan remains appropriate    Precautions / Restrictions Precautions Precautions: Shoulder Shoulder Interventions: Off for dressing/bathing/exercises Required Braces or Orthoses: Sling Restrictions Weight Bearing Restrictions: Yes RUE Weight Bearing: Non weight bearing   Pertinent Vitals/Pain 2/10 R shoulder; ice    ADL  Eating/Feeding: Set up Where Assessed - Eating/Feeding: Chair Grooming: Set up Where Assessed - Grooming: Unsupported standing Upper Body Bathing: Set up Where Assessed - Upper Body Bathing: Unsupported standing Lower Body Bathing: Set up Where Assessed - Lower Body Bathing: Unsupported sit to stand Upper Body Dressing: Minimal assistance Where Assessed - Upper Body Dressing: Unsupported standing Lower Body Dressing: Minimal assistance Where Assessed - Lower Body Dressing: Unsupported sit to stand Toilet Transfer: Supervision/safety Toilet Transfer Method: Sit to Barista: Regular height toilet Toileting - Clothing Manipulation and Hygiene: Minimal assistance Where Assessed - Engineer, mining and Hygiene: Sit to stand from 3-in-1 or toilet Equipment Used:  (sling) Transfers/Ambulation Related to ADLs: S for all with SPC ADL Comments: Pt donned sling mod I by propping RUE on counter top. Pt ambulated around the entire unit with S and using her [facility]     OT Diagnosis: Generalized weakness;Acute pain  OT Problem List: Decreased strength;Decreased range of motion;Pain;Impaired UE functional use OT Treatment Interventions: Self-care/ADL training;Therapeutic exercise;DME and/or AE instruction;Patient/family education   OT Goals(current goals can now be found in the care plan section) Acute Rehab OT Goals Patient Stated Goal: Home tomorrow OT Goal Formulation: With patient Time For Goal Achievement: 02/24/13 Potential to Achieve Goals: Good ADL Goals Pt Will Perform Grooming: with modified independence;sitting;standing Pt Will Perform Upper Body Dressing: with modified independence;sitting;standing Pt/caregiver will Perform Home Exercise Program: Right Upper extremity;Independently;With written HEP provided Additional ADL Goal #1: Pt will be Mod I in donning and doffing sling  Visit Information  Last OT Received On: 02/17/13 Assistance Needed: +1 History of Present Illness: RTSA       Prior Functioning  Home Living Family/patient expects to be discharged to:: Private residence Living Arrangements: Alone Available Help at Discharge: Family;Available PRN/intermittently Type of Home: House Home Access: Stairs to enter [employer] of Steps: 2 Entrance Stairs-Rails: None (uses her [facility]) Home Layout: One level Home Equipment: Grab bars - tub/shower Prior Function Level of Independence: Independent Communication Communication: No difficulties Dominant Hand: Right    Cognition  Cognition Arousal/Alertness: Awake/alert Behavior During Therapy: WFL for tasks assessed/performed Overall Cognitive Status: Within Functional Limits for tasks assessed    Mobility  Bed Mobility Bed Mobility: Left Sidelying to Sit;Sit to Sidelying Left Left Sidelying to Sit: 6: Modified independent (Device/Increase time);HOB flat;With rails Supine to Sit: 6: Modified independent (Device/Increase time);HOB elevated Sitting - Scoot to Edge of  Bed: 7: Independent Sit to Sidelying Left: 6: Modified independent (Device/Increase time);With rail;HOB flat Transfers Transfers: Sit to Stand;Stand to Sit Sit to Stand: 6: Modified independent (Device/Increase time) (with SPC)  Stand to Sit: 6: Modified independent (Device/Increase time) (with [facility])    Exercises  Other Exercises Other Exercises: Pt completed 10 reps of elbow flexion and extension as well as 10 reps of exercise she said Dr. Ophelia Charter showed her which was gentle AAROM shoulder flexion/extension and abduction/adduction while in sling Method for sponge bathing under operated UE: Independent Donning/doffing sling/immobilizer: Supervision/safety Correct positioning of sling/immobilizer: Supervision/safety Pendulum exercises (written home exercise program):  (NA at this time) ROM for elbow, wrist and digits of operated UE: Minimal assistance (due to nerve block not worn off) Sling wearing schedule (on at all times/off for ADL's): Independent Dressing change:  (NA for OT) Positioning of UE while sleeping: Independent      End of Session OT - End of Session Equipment Utilized During Treatment:  [facility]) Activity Tolerance: Patient tolerated treatment well Patient left: in bed;with call bell/phone within reach Nurse Communication: Mobility status       Evette Georges [phone] 02/17/2013, 3:22 PM

## 2013-02-18 MED ORDER — OXYCODONE-ACETAMINOPHEN 5-325 MG PO TABS: 1.0000 | ORAL_TABLET | ORAL | Status: DC | PRN

## 2013-02-18 MED ORDER — METHOCARBAMOL 500 MG PO TABS: 500.0000 mg | ORAL_TABLET | Freq: Four times a day (QID) | ORAL | Status: DC | PRN

## 2013-02-18 NOTE — Progress Notes (Signed)
Patient ID: [name] [name], female   DOB: 1940-08-31, 72 y.o.   MRN: [id number] Plan Home today . She will do pendulum at home and arm circles.  Office R/U in 1 to 2 wks and will arrange therapy at that point.

## 2013-02-18 NOTE — Progress Notes (Addendum)
Occupational Therapy Treatment and Discharge Patient Details Name: [name] [name] MRN: [id number] DOB: 03-22-1940 Today's Date: 02/18/2013 Time: 1308-6578 OT Time Calculation (min): 28 min  OT Assessment / Plan / Recommendation  History of present illness RTSA   OT comments  This 72 yo female admitted with above presents to acute OT with all education completed and handout given for pendulums (after seeing Dr. Bonnita Hollow note today). Acute OT will sign off.  Follow Up Recommendations   (per MD)       Equipment Recommendations  None recommended by OT       Frequency Min 2X/week   Progress towards OT Goals Progress towards OT goals: Goals met/education completed, patient discharged from OT  Plan Discharge plan remains appropriate    Precautions / Restrictions Precautions Precautions: Shoulder Shoulder Interventions: Off for dressing/bathing/exercises Required Braces or Orthoses: Sling Restrictions Weight Bearing Restrictions: Yes RUE Weight Bearing: Non weight bearing   Pertinent Vitals/Pain 8/10 shoulder; gave pt the choice of me coming back later; however she said now was fine (pre-medicated), repositioned and ice after session    ADL  Upper Body Bathing: Modified independent Where Assessed - Upper Body Bathing: Unsupported standing Lower Body Bathing: Modified independent Where Assessed - Lower Body Bathing: Unsupported sit to stand Upper Body Dressing: Modified independent Where Assessed - Upper Body Dressing: Unsupported sit to stand Lower Body Dressing: Modified independent Where Assessed - Lower Body Dressing: Unsupported sit to stand Equipment Used: Cane (sling) Transfers/Ambulation Related to ADLs: Mod I with SPC ADL Comments: Pt able to doff/donn sling Mod I by propping her arm on cabinet while she gets sling adjusted      OT Goals(current goals can now be found in the care plan section)    Visit Information  Last OT Received On: 02/18/13 Assistance Needed:  +1 History of Present Illness: RTSA          Cognition  Cognition Arousal/Alertness: Awake/alert Behavior During Therapy: WFL for tasks assessed/performed Overall Cognitive Status: Within Functional Limits for tasks assessed    Mobility  Bed Mobility Bed Mobility: Supine to Sit;Sitting - Scoot to Edge of Bed Supine to Sit: 6: Modified independent (Device/Increase time);With rails;HOB flat Sitting - Scoot to Edge of Bed: 6: Modified independent (Device/Increase time) Transfers Transfers: Sit to Stand;Stand to Sit Sit to Stand: 6: Modified independent (Device/Increase time);With upper extremity assist;From bed (LUE) Stand to Sit: 6: Modified independent (Device/Increase time);With upper extremity assist;To bed (use of LUE)    Exercises  Other Exercises Other Exercises: Pt completed 10 reps each of all 4 pendulum exercises and elbow flexion /extension (handout given) Donning/doffing shirt without moving shoulder: Modified independent Method for sponge bathing under operated UE: Modified independent Donning/doffing sling/immobilizer: Modified independent Correct positioning of sling/immobilizer: Independent Pendulum exercises (written home exercise program): Independent ROM for elbow, wrist and digits of operated UE: Independent Sling wearing schedule (on at all times/off for ADL's): Independent Proper positioning of operated UE when showering: Independent Dressing change:  (NA for OT) Positioning of UE while sleeping: Independent      End of Session OT - End of Session Equipment Utilized During Treatment:  [facility]) Activity Tolerance: Patient tolerated treatment well Patient left: in bed;with call bell/phone within reach       Evette Georges [phone] 02/18/2013, 11:14 AM

## 2013-02-18 NOTE — Progress Notes (Signed)
Discharge instructions and Rx given to and reviewed with patient. No questions or concerns. IV removed. Patient VS stable. Patient discharged via wheelchair accompanied by family with personal belongings, discharge packet, and Rx's.

## 2013-02-23 NOTE — Discharge Summary (Signed)
Physician Discharge Summary  Patient ID: [name] [name] MRN: [id number] DOB/AGE: 1940/08/17 72 y.o.  Admit date: 02/16/2013 Discharge date: 02/18/2013  Admission Diagnoses:  Primary osteoarthritis of right shoulder  Discharge Diagnoses:  Principal Problem:   Primary osteoarthritis of right shoulder   Past Medical History  Diagnosis Date  . PONV (postoperative nausea and vomiting)   . Hypertension   . Depression   . Seasonal allergies   . Pneumonia   . Diabetes mellitus without complication   . Anxiety   . GERD (gastroesophageal reflux disease)   . H/O hiatal hernia   . Arthritis   . Anemia     Surgeries: Procedure(s): TOTAL SHOULDER ARTHROPLASTY RIGHT on 02/16/2013   Consultants (if any):  NONE  Discharged Condition: Improved  Hospital Course: [name] [name] is an 72 y.o. female who was admitted 02/16/2013 with a diagnosis of Primary osteoarthritis of right shoulder and went to the operating room on 02/16/2013 and underwent the above named procedures.    She was given perioperative antibiotics:      Anti-infectives   Start     Dose/Rate Route Frequency Ordered Stop   02/16/13 0600  clindamycin (CLEOCIN) IVPB 900 mg     900 mg 100 mL/hr over 30 Minutes Intravenous On call to O.R. 02/15/13 1440 02/16/13 1219    .  She was given sequential compression devices, early ambulation for DVT prophylaxis.  She benefited maximally from the hospital stay and there were no complications.    Recent vital signs:  Filed Vitals:   02/18/13 0629  BP: 112/58  Pulse: 53  Temp: 98.4 F (36.9 C)  Resp: 18    Recent laboratory studies:  Lab Results  Component Value Date   HGB 11.6* 02/12/2013   HGB 13.1 01/07/2013   HGB 14.1 11/25/2009   Lab Results  Component Value Date   WBC 9.3 02/12/2013   PLT 267 02/12/2013   Lab Results  Component Value Date   INR 1.0 05/29/2007   Lab Results  Component Value Date   NA 138 02/12/2013   K 3.6 02/12/2013   CL 103 02/12/2013    CO2 25 02/12/2013   BUN 23 02/12/2013   CREATININE 1.16* 02/12/2013   GLUCOSE 117* 02/12/2013    Discharge Medications:     Medication List    STOP taking these medications       HYDROcodone-acetaminophen 10-325 MG per tablet  Commonly known as:  NORCO      TAKE these medications       acetaminophen 500 MG tablet  Commonly known as:  TYLENOL  Take 1,000 mg by mouth daily as needed. For pain     CITRACAL PETITES/VITAMIN D 200-250 MG-UNIT Tabs  Generic drug:  Calcium Citrate-Vitamin D  Take 1 tablet by mouth daily.     clobetasol cream 0.05 %  Commonly known as:  TEMOVATE  Apply 1 application topically daily as needed. scalp     diazepam 5 MG tablet  Commonly known as:  VALIUM  Take 5 mg by mouth 3 (three) times daily as needed. For anxiety     diphenhydrAMINE 25 MG tablet  Commonly known as:  BENADRYL  Take 25 mg by mouth daily as needed. For allergies     docusate sodium 100 MG capsule  Commonly known as:  COLACE  Take 100 mg by mouth daily as needed for mild constipation.     methocarbamol 500 MG tablet  Commonly known as:  ROBAXIN  Take 1 tablet (  500 mg total) by mouth every 6 (six) hours as needed for muscle spasms (spasm).     omeprazole 20 MG capsule  Commonly known as:  PRILOSEC  Take 20 mg by mouth 2 (two) times daily.     oxyCODONE-acetaminophen 5-325 MG per tablet  Commonly known as:  ROXICET  Take 1-2 tablets by mouth every 4 (four) hours as needed for moderate pain or severe pain.     potassium chloride 10 MEQ tablet  Commonly known as:  K-DUR  Take 10 mEq by mouth 3 (three) times daily with meals.     sertraline 100 MG tablet  Commonly known as:  ZOLOFT  Take 100 mg by mouth daily.     traZODone 50 MG tablet  Commonly known as:  DESYREL  Take 50 mg by mouth at bedtime.     triamterene-hydrochlorothiazide 75-50 MG per tablet  Commonly known as:  MAXZIDE  Take 1 tablet by mouth daily.     valACYclovir 500 MG tablet  Commonly known  as:  VALTREX  Take 500 mg by mouth daily as needed (fever blisters).        Diagnostic Studies: No results found.  Disposition: 01-Home or Self Care  Discharge Orders   Future Orders Complete By Expires   Call MD / Call 911  As directed    Comments:     If you experience chest pain or shortness of breath, CALL 911 and be transported to the hospital emergency room.  If you develope a fever above 101 F, pus (white drainage) or increased drainage or redness at the wound, or calf pain, call your surgeon's office.   Constipation Prevention  As directed    Comments:     Drink plenty of fluids.  Prune juice may be helpful.  You may use a stool softener, such as Colace (over the counter) 100 mg twice a day.  Use MiraLax (over the counter) for constipation as needed.   Diet - low sodium heart healthy  As directed    Increase activity slowly as tolerated  As directed     discharge instructions given to patient:  wear sling at all times.  Ice packs to shoulder daily or as needed for swelling and pain.  Change dressing daily or as needed.  May shower  5 days post surgery if no wound drainage.  Keep wound covered for 10 days then may stop dressings. Move elbow, wrist and hand frequently. May do pendulum exercises as taught to you by Dr Ophelia Charter.  Follow-up Information   Follow up with Eldred Manges, MD. Schedule an appointment as soon as possible for a visit in 2 weeks. (or as scheduled)    Specialty:  Orthopedic Surgery   Contact information:   [address] [place] [address] [phone]        Signed: Wende Neighbors 02/23/2013, 3:59 PM

## 2013-03-03 DIAGNOSIS — M19019 Primary osteoarthritis, unspecified shoulder: Secondary | ICD-10-CM | POA: Diagnosis not present

## 2013-03-05 DIAGNOSIS — Z8701 Personal history of pneumonia (recurrent): Secondary | ICD-10-CM | POA: Diagnosis not present

## 2013-03-05 DIAGNOSIS — E669 Obesity, unspecified: Secondary | ICD-10-CM | POA: Diagnosis not present

## 2013-03-05 DIAGNOSIS — Z5189 Encounter for other specified aftercare: Secondary | ICD-10-CM | POA: Diagnosis not present

## 2013-03-05 DIAGNOSIS — Z96619 Presence of unspecified artificial shoulder joint: Secondary | ICD-10-CM | POA: Diagnosis not present

## 2013-03-05 DIAGNOSIS — Z96649 Presence of unspecified artificial hip joint: Secondary | ICD-10-CM | POA: Diagnosis not present

## 2013-03-05 DIAGNOSIS — Z471 Aftercare following joint replacement surgery: Secondary | ICD-10-CM | POA: Diagnosis not present

## 2013-03-05 DIAGNOSIS — M159 Polyosteoarthritis, unspecified: Secondary | ICD-10-CM | POA: Diagnosis not present

## 2013-03-05 DIAGNOSIS — Z96659 Presence of unspecified artificial knee joint: Secondary | ICD-10-CM | POA: Diagnosis not present

## 2013-03-09 DIAGNOSIS — K56609 Unspecified intestinal obstruction, unspecified as to partial versus complete obstruction: Secondary | ICD-10-CM | POA: Diagnosis not present

## 2013-03-10 DIAGNOSIS — R7309 Other abnormal glucose: Secondary | ICD-10-CM | POA: Diagnosis not present

## 2013-03-10 DIAGNOSIS — Z96619 Presence of unspecified artificial shoulder joint: Secondary | ICD-10-CM | POA: Diagnosis not present

## 2013-03-10 DIAGNOSIS — E669 Obesity, unspecified: Secondary | ICD-10-CM | POA: Diagnosis not present

## 2013-03-10 DIAGNOSIS — M159 Polyosteoarthritis, unspecified: Secondary | ICD-10-CM | POA: Diagnosis not present

## 2013-03-10 DIAGNOSIS — K219 Gastro-esophageal reflux disease without esophagitis: Secondary | ICD-10-CM | POA: Diagnosis not present

## 2013-03-10 DIAGNOSIS — I10 Essential (primary) hypertension: Secondary | ICD-10-CM | POA: Diagnosis not present

## 2013-03-10 DIAGNOSIS — M129 Arthropathy, unspecified: Secondary | ICD-10-CM | POA: Diagnosis not present

## 2013-03-10 DIAGNOSIS — Z8701 Personal history of pneumonia (recurrent): Secondary | ICD-10-CM | POA: Diagnosis not present

## 2013-03-10 DIAGNOSIS — Z5189 Encounter for other specified aftercare: Secondary | ICD-10-CM | POA: Diagnosis not present

## 2013-03-10 DIAGNOSIS — Z471 Aftercare following joint replacement surgery: Secondary | ICD-10-CM | POA: Diagnosis not present

## 2013-03-12 DIAGNOSIS — E669 Obesity, unspecified: Secondary | ICD-10-CM | POA: Diagnosis not present

## 2013-03-12 DIAGNOSIS — M159 Polyosteoarthritis, unspecified: Secondary | ICD-10-CM | POA: Diagnosis not present

## 2013-03-12 DIAGNOSIS — Z471 Aftercare following joint replacement surgery: Secondary | ICD-10-CM | POA: Diagnosis not present

## 2013-03-12 DIAGNOSIS — Z96619 Presence of unspecified artificial shoulder joint: Secondary | ICD-10-CM | POA: Diagnosis not present

## 2013-03-12 DIAGNOSIS — Z8701 Personal history of pneumonia (recurrent): Secondary | ICD-10-CM | POA: Diagnosis not present

## 2013-03-12 DIAGNOSIS — Z5189 Encounter for other specified aftercare: Secondary | ICD-10-CM | POA: Diagnosis not present

## 2013-03-13 DIAGNOSIS — M159 Polyosteoarthritis, unspecified: Secondary | ICD-10-CM | POA: Diagnosis not present

## 2013-03-13 DIAGNOSIS — Z96619 Presence of unspecified artificial shoulder joint: Secondary | ICD-10-CM | POA: Diagnosis not present

## 2013-03-13 DIAGNOSIS — Z5189 Encounter for other specified aftercare: Secondary | ICD-10-CM | POA: Diagnosis not present

## 2013-03-13 DIAGNOSIS — Z471 Aftercare following joint replacement surgery: Secondary | ICD-10-CM | POA: Diagnosis not present

## 2013-03-13 DIAGNOSIS — E669 Obesity, unspecified: Secondary | ICD-10-CM | POA: Diagnosis not present

## 2013-03-13 DIAGNOSIS — Z8701 Personal history of pneumonia (recurrent): Secondary | ICD-10-CM | POA: Diagnosis not present

## 2013-03-17 DIAGNOSIS — M159 Polyosteoarthritis, unspecified: Secondary | ICD-10-CM | POA: Diagnosis not present

## 2013-03-17 DIAGNOSIS — Z5189 Encounter for other specified aftercare: Secondary | ICD-10-CM | POA: Diagnosis not present

## 2013-03-17 DIAGNOSIS — Z8701 Personal history of pneumonia (recurrent): Secondary | ICD-10-CM | POA: Diagnosis not present

## 2013-03-17 DIAGNOSIS — Z96619 Presence of unspecified artificial shoulder joint: Secondary | ICD-10-CM | POA: Diagnosis not present

## 2013-03-17 DIAGNOSIS — Z471 Aftercare following joint replacement surgery: Secondary | ICD-10-CM | POA: Diagnosis not present

## 2013-03-17 DIAGNOSIS — E669 Obesity, unspecified: Secondary | ICD-10-CM | POA: Diagnosis not present

## 2013-03-18 DIAGNOSIS — Z471 Aftercare following joint replacement surgery: Secondary | ICD-10-CM | POA: Diagnosis not present

## 2013-03-18 DIAGNOSIS — E669 Obesity, unspecified: Secondary | ICD-10-CM | POA: Diagnosis not present

## 2013-03-18 DIAGNOSIS — Z96619 Presence of unspecified artificial shoulder joint: Secondary | ICD-10-CM | POA: Diagnosis not present

## 2013-03-18 DIAGNOSIS — Z8701 Personal history of pneumonia (recurrent): Secondary | ICD-10-CM | POA: Diagnosis not present

## 2013-03-18 DIAGNOSIS — Z5189 Encounter for other specified aftercare: Secondary | ICD-10-CM | POA: Diagnosis not present

## 2013-03-18 DIAGNOSIS — M159 Polyosteoarthritis, unspecified: Secondary | ICD-10-CM | POA: Diagnosis not present

## 2013-03-19 DIAGNOSIS — M159 Polyosteoarthritis, unspecified: Secondary | ICD-10-CM | POA: Diagnosis not present

## 2013-03-19 DIAGNOSIS — E669 Obesity, unspecified: Secondary | ICD-10-CM | POA: Diagnosis not present

## 2013-03-19 DIAGNOSIS — Z96619 Presence of unspecified artificial shoulder joint: Secondary | ICD-10-CM | POA: Diagnosis not present

## 2013-03-19 DIAGNOSIS — Z8701 Personal history of pneumonia (recurrent): Secondary | ICD-10-CM | POA: Diagnosis not present

## 2013-03-19 DIAGNOSIS — Z471 Aftercare following joint replacement surgery: Secondary | ICD-10-CM | POA: Diagnosis not present

## 2013-03-19 DIAGNOSIS — Z5189 Encounter for other specified aftercare: Secondary | ICD-10-CM | POA: Diagnosis not present

## 2013-03-20 DIAGNOSIS — M159 Polyosteoarthritis, unspecified: Secondary | ICD-10-CM | POA: Diagnosis not present

## 2013-03-20 DIAGNOSIS — Z96619 Presence of unspecified artificial shoulder joint: Secondary | ICD-10-CM | POA: Diagnosis not present

## 2013-03-20 DIAGNOSIS — Z5189 Encounter for other specified aftercare: Secondary | ICD-10-CM | POA: Diagnosis not present

## 2013-03-20 DIAGNOSIS — E669 Obesity, unspecified: Secondary | ICD-10-CM | POA: Diagnosis not present

## 2013-03-20 DIAGNOSIS — Z8701 Personal history of pneumonia (recurrent): Secondary | ICD-10-CM | POA: Diagnosis not present

## 2013-03-20 DIAGNOSIS — Z471 Aftercare following joint replacement surgery: Secondary | ICD-10-CM | POA: Diagnosis not present

## 2013-03-24 DIAGNOSIS — M159 Polyosteoarthritis, unspecified: Secondary | ICD-10-CM | POA: Diagnosis not present

## 2013-03-24 DIAGNOSIS — Z5189 Encounter for other specified aftercare: Secondary | ICD-10-CM | POA: Diagnosis not present

## 2013-03-24 DIAGNOSIS — Z96619 Presence of unspecified artificial shoulder joint: Secondary | ICD-10-CM | POA: Diagnosis not present

## 2013-03-24 DIAGNOSIS — E669 Obesity, unspecified: Secondary | ICD-10-CM | POA: Diagnosis not present

## 2013-03-24 DIAGNOSIS — Z471 Aftercare following joint replacement surgery: Secondary | ICD-10-CM | POA: Diagnosis not present

## 2013-03-24 DIAGNOSIS — Z8701 Personal history of pneumonia (recurrent): Secondary | ICD-10-CM | POA: Diagnosis not present

## 2013-03-26 DIAGNOSIS — E669 Obesity, unspecified: Secondary | ICD-10-CM | POA: Diagnosis not present

## 2013-03-26 DIAGNOSIS — Z471 Aftercare following joint replacement surgery: Secondary | ICD-10-CM | POA: Diagnosis not present

## 2013-03-26 DIAGNOSIS — Z5189 Encounter for other specified aftercare: Secondary | ICD-10-CM | POA: Diagnosis not present

## 2013-03-26 DIAGNOSIS — M159 Polyosteoarthritis, unspecified: Secondary | ICD-10-CM | POA: Diagnosis not present

## 2013-03-26 DIAGNOSIS — Z96619 Presence of unspecified artificial shoulder joint: Secondary | ICD-10-CM | POA: Diagnosis not present

## 2013-03-26 DIAGNOSIS — Z8701 Personal history of pneumonia (recurrent): Secondary | ICD-10-CM | POA: Diagnosis not present

## 2013-03-31 DIAGNOSIS — Z96619 Presence of unspecified artificial shoulder joint: Secondary | ICD-10-CM | POA: Diagnosis not present

## 2013-03-31 DIAGNOSIS — E669 Obesity, unspecified: Secondary | ICD-10-CM | POA: Diagnosis not present

## 2013-03-31 DIAGNOSIS — Z471 Aftercare following joint replacement surgery: Secondary | ICD-10-CM | POA: Diagnosis not present

## 2013-03-31 DIAGNOSIS — Z5189 Encounter for other specified aftercare: Secondary | ICD-10-CM | POA: Diagnosis not present

## 2013-03-31 DIAGNOSIS — Z8701 Personal history of pneumonia (recurrent): Secondary | ICD-10-CM | POA: Diagnosis not present

## 2013-03-31 DIAGNOSIS — M159 Polyosteoarthritis, unspecified: Secondary | ICD-10-CM | POA: Diagnosis not present

## 2013-04-02 DIAGNOSIS — Z8701 Personal history of pneumonia (recurrent): Secondary | ICD-10-CM | POA: Diagnosis not present

## 2013-04-02 DIAGNOSIS — E669 Obesity, unspecified: Secondary | ICD-10-CM | POA: Diagnosis not present

## 2013-04-02 DIAGNOSIS — M159 Polyosteoarthritis, unspecified: Secondary | ICD-10-CM | POA: Diagnosis not present

## 2013-04-02 DIAGNOSIS — Z5189 Encounter for other specified aftercare: Secondary | ICD-10-CM | POA: Diagnosis not present

## 2013-04-02 DIAGNOSIS — Z96619 Presence of unspecified artificial shoulder joint: Secondary | ICD-10-CM | POA: Diagnosis not present

## 2013-04-02 DIAGNOSIS — Z471 Aftercare following joint replacement surgery: Secondary | ICD-10-CM | POA: Diagnosis not present

## 2013-04-07 DIAGNOSIS — Z471 Aftercare following joint replacement surgery: Secondary | ICD-10-CM | POA: Diagnosis not present

## 2013-04-07 DIAGNOSIS — Z8701 Personal history of pneumonia (recurrent): Secondary | ICD-10-CM | POA: Diagnosis not present

## 2013-04-07 DIAGNOSIS — M159 Polyosteoarthritis, unspecified: Secondary | ICD-10-CM | POA: Diagnosis not present

## 2013-04-07 DIAGNOSIS — Z5189 Encounter for other specified aftercare: Secondary | ICD-10-CM | POA: Diagnosis not present

## 2013-04-07 DIAGNOSIS — E669 Obesity, unspecified: Secondary | ICD-10-CM | POA: Diagnosis not present

## 2013-04-07 DIAGNOSIS — Z96619 Presence of unspecified artificial shoulder joint: Secondary | ICD-10-CM | POA: Diagnosis not present

## 2013-04-09 DIAGNOSIS — Z8701 Personal history of pneumonia (recurrent): Secondary | ICD-10-CM | POA: Diagnosis not present

## 2013-04-09 DIAGNOSIS — E669 Obesity, unspecified: Secondary | ICD-10-CM | POA: Diagnosis not present

## 2013-04-09 DIAGNOSIS — Z96619 Presence of unspecified artificial shoulder joint: Secondary | ICD-10-CM | POA: Diagnosis not present

## 2013-04-09 DIAGNOSIS — Z471 Aftercare following joint replacement surgery: Secondary | ICD-10-CM | POA: Diagnosis not present

## 2013-04-09 DIAGNOSIS — Z5189 Encounter for other specified aftercare: Secondary | ICD-10-CM | POA: Diagnosis not present

## 2013-04-09 DIAGNOSIS — M159 Polyosteoarthritis, unspecified: Secondary | ICD-10-CM | POA: Diagnosis not present

## 2013-04-11 DIAGNOSIS — R6889 Other general symptoms and signs: Secondary | ICD-10-CM | POA: Diagnosis not present

## 2013-04-11 DIAGNOSIS — Z96659 Presence of unspecified artificial knee joint: Secondary | ICD-10-CM | POA: Diagnosis not present

## 2013-04-11 DIAGNOSIS — M25519 Pain in unspecified shoulder: Secondary | ICD-10-CM | POA: Diagnosis not present

## 2013-04-11 DIAGNOSIS — W010XXA Fall on same level from slipping, tripping and stumbling without subsequent striking against object, initial encounter: Secondary | ICD-10-CM | POA: Diagnosis not present

## 2013-04-11 DIAGNOSIS — Z9884 Bariatric surgery status: Secondary | ICD-10-CM | POA: Diagnosis not present

## 2013-04-11 DIAGNOSIS — Z96649 Presence of unspecified artificial hip joint: Secondary | ICD-10-CM | POA: Diagnosis not present

## 2013-04-11 DIAGNOSIS — S4980XA Other specified injuries of shoulder and upper arm, unspecified arm, initial encounter: Secondary | ICD-10-CM | POA: Diagnosis not present

## 2013-04-11 DIAGNOSIS — I10 Essential (primary) hypertension: Secondary | ICD-10-CM | POA: Diagnosis not present

## 2013-04-11 DIAGNOSIS — M79609 Pain in unspecified limb: Secondary | ICD-10-CM | POA: Diagnosis not present

## 2013-04-11 DIAGNOSIS — Y9301 Activity, walking, marching and hiking: Secondary | ICD-10-CM | POA: Diagnosis not present

## 2013-04-11 DIAGNOSIS — S46909A Unspecified injury of unspecified muscle, fascia and tendon at shoulder and upper arm level, unspecified arm, initial encounter: Secondary | ICD-10-CM | POA: Diagnosis not present

## 2013-04-11 DIAGNOSIS — Z79899 Other long term (current) drug therapy: Secondary | ICD-10-CM | POA: Diagnosis not present

## 2013-04-11 DIAGNOSIS — K219 Gastro-esophageal reflux disease without esophagitis: Secondary | ICD-10-CM | POA: Diagnosis not present

## 2013-04-11 DIAGNOSIS — Y92009 Unspecified place in unspecified non-institutional (private) residence as the place of occurrence of the external cause: Secondary | ICD-10-CM | POA: Diagnosis not present

## 2013-04-17 DIAGNOSIS — E876 Hypokalemia: Secondary | ICD-10-CM | POA: Diagnosis not present

## 2013-04-17 DIAGNOSIS — F3289 Other specified depressive episodes: Secondary | ICD-10-CM | POA: Diagnosis not present

## 2013-04-17 DIAGNOSIS — I10 Essential (primary) hypertension: Secondary | ICD-10-CM | POA: Diagnosis not present

## 2013-04-17 DIAGNOSIS — F329 Major depressive disorder, single episode, unspecified: Secondary | ICD-10-CM | POA: Diagnosis not present

## 2013-04-17 DIAGNOSIS — M129 Arthropathy, unspecified: Secondary | ICD-10-CM | POA: Diagnosis not present

## 2013-04-23 DIAGNOSIS — S40019A Contusion of unspecified shoulder, initial encounter: Secondary | ICD-10-CM | POA: Diagnosis not present

## 2013-05-14 DIAGNOSIS — H5210 Myopia, unspecified eye: Secondary | ICD-10-CM | POA: Diagnosis not present

## 2013-05-14 DIAGNOSIS — H43399 Other vitreous opacities, unspecified eye: Secondary | ICD-10-CM | POA: Diagnosis not present

## 2013-05-14 DIAGNOSIS — H524 Presbyopia: Secondary | ICD-10-CM | POA: Diagnosis not present

## 2013-05-14 DIAGNOSIS — H353190 Nonexudative age-related macular degeneration, unspecified eye, stage unspecified: Secondary | ICD-10-CM | POA: Diagnosis not present

## 2013-05-15 DIAGNOSIS — K219 Gastro-esophageal reflux disease without esophagitis: Secondary | ICD-10-CM | POA: Diagnosis not present

## 2013-05-15 DIAGNOSIS — F329 Major depressive disorder, single episode, unspecified: Secondary | ICD-10-CM | POA: Diagnosis not present

## 2013-05-15 DIAGNOSIS — F3289 Other specified depressive episodes: Secondary | ICD-10-CM | POA: Diagnosis not present

## 2013-05-15 DIAGNOSIS — Z79899 Other long term (current) drug therapy: Secondary | ICD-10-CM | POA: Diagnosis not present

## 2013-05-15 DIAGNOSIS — K5289 Other specified noninfective gastroenteritis and colitis: Secondary | ICD-10-CM | POA: Diagnosis not present

## 2013-05-15 DIAGNOSIS — R109 Unspecified abdominal pain: Secondary | ICD-10-CM | POA: Diagnosis not present

## 2013-05-15 DIAGNOSIS — I10 Essential (primary) hypertension: Secondary | ICD-10-CM | POA: Diagnosis not present

## 2013-05-18 DIAGNOSIS — R112 Nausea with vomiting, unspecified: Secondary | ICD-10-CM | POA: Diagnosis not present

## 2013-05-18 DIAGNOSIS — R197 Diarrhea, unspecified: Secondary | ICD-10-CM | POA: Diagnosis not present

## 2013-05-18 DIAGNOSIS — H2510 Age-related nuclear cataract, unspecified eye: Secondary | ICD-10-CM | POA: Diagnosis not present

## 2013-05-18 DIAGNOSIS — H353190 Nonexudative age-related macular degeneration, unspecified eye, stage unspecified: Secondary | ICD-10-CM | POA: Diagnosis not present

## 2013-06-01 DIAGNOSIS — M549 Dorsalgia, unspecified: Secondary | ICD-10-CM | POA: Diagnosis not present

## 2013-06-01 DIAGNOSIS — M542 Cervicalgia: Secondary | ICD-10-CM | POA: Diagnosis not present

## 2013-06-01 DIAGNOSIS — IMO0002 Reserved for concepts with insufficient information to code with codable children: Secondary | ICD-10-CM | POA: Diagnosis not present

## 2013-06-01 DIAGNOSIS — Z6837 Body mass index (BMI) 37.0-37.9, adult: Secondary | ICD-10-CM | POA: Diagnosis not present

## 2013-06-11 DIAGNOSIS — M542 Cervicalgia: Secondary | ICD-10-CM | POA: Diagnosis not present

## 2013-06-11 DIAGNOSIS — S0993XA Unspecified injury of face, initial encounter: Secondary | ICD-10-CM | POA: Diagnosis not present

## 2013-06-11 DIAGNOSIS — M5412 Radiculopathy, cervical region: Secondary | ICD-10-CM | POA: Diagnosis not present

## 2013-06-11 DIAGNOSIS — S199XXA Unspecified injury of neck, initial encounter: Secondary | ICD-10-CM | POA: Diagnosis not present

## 2013-07-20 DIAGNOSIS — H35369 Drusen (degenerative) of macula, unspecified eye: Secondary | ICD-10-CM | POA: Diagnosis not present

## 2013-07-20 DIAGNOSIS — H2510 Age-related nuclear cataract, unspecified eye: Secondary | ICD-10-CM | POA: Diagnosis not present

## 2013-07-22 ENCOUNTER — Encounter (HOSPITAL_COMMUNITY): Payer: Self-pay | Admitting: Pharmacy Technician

## 2013-07-29 NOTE — Patient Instructions (Signed)
LAURELLE SKIVER  07/29/2013   Your procedure is scheduled on:  08/06/13  Report to [facility] at 10:30 AM.  Call this number if you have problems the morning of surgery: [phone]   Remember:   Do not eat food or drink liquids after midnight.   Take these medicines the morning of surgery with A SIP OF WATER: Valium, Hydrocodone, Prilosec, Zoloft  Do not wear jewelry, make-up or nail polish.  Do not wear lotions, powders, or perfumes. You may wear deodorant.  Do not shave 48 hours prior to surgery. Men may shave face and neck.  Do not bring valuables to the hospital.  [facility] is not responsible for any belongings or valuables.               Contacts, dentures or bridgework may not be worn into surgery.  Leave suitcase in the car. After surgery it may be brought to your room.  For patients admitted to the hospital, discharge time is determined by your treatment team.               Patients discharged the day of surgery will not be allowed to drive home.  Name and phone number of your driver:   Special Instructions: N/A   Please read over the following fact sheets that you were given: Anesthesia Post-op Instructions and Care and Recovery After Surgery   PATIENT INSTRUCTIONS POST-ANESTHESIA  IMMEDIATELY FOLLOWING SURGERY:  Do not drive or operate machinery for the first twenty four hours after surgery.  Do not make any important decisions for twenty four hours after surgery or while taking narcotic pain medications or sedatives.  If you develop intractable nausea and vomiting or a severe headache please notify your doctor immediately.  FOLLOW-UP:  Please make an appointment with your surgeon as instructed. You do not need to follow up with anesthesia unless specifically instructed to do so.  WOUND CARE INSTRUCTIONS (if applicable):  Keep a dry clean dressing on the anesthesia/puncture wound site if there is drainage.  Once the wound has quit draining you may leave it open to air.   Generally you should leave the bandage intact for twenty four hours unless there is drainage.  If the epidural site drains for more than 36-48 hours please call the anesthesia department.  QUESTIONS?:  Please feel free to call your physician or the hospital operator if you have any questions, and they will be happy to assist you.      Cataract Surgery Care After Refer to this sheet in the next few weeks. These instructions provide you with information on caring for yourself after your procedure. Your caregiver may also give you more specific instructions. Your treatment has been planned according to current medical practices, but problems sometimes occur. Call your caregiver if you have any problems or questions after your procedure.  HOME CARE INSTRUCTIONS   Avoid strenuous activities as directed by your caregiver.  Ask your caregiver when you can resume driving.  Use eyedrops or other medicines to help healing and control pressure inside your eye as directed by your caregiver.  Only take over-the-counter or prescription medicines for pain, discomfort, or fever as directed by your caregiver.  Do not to touch or rub your eyes.  You may be instructed to use a protective shield during the first few days and nights after surgery. If not, wear sunglasses to protect your eyes. This is to protect the eye from pressure or from being accidentally bumped.  Keep  the area around your eye clean and dry. Avoid swimming or allowing water to hit you directly in the face while showering. Keep soap and shampoo out of your eyes.  Do not bend or lift heavy objects. Bending increases pressure in the eye. You can walk, climb stairs, and do light household chores.  Do not put a contact lens into the eye that had surgery until your caregiver says it is okay to do so.  Ask your doctor when you can return to work. This will depend on the kind of work that you do. If you work in a dusty environment, you may be  advised to wear protective eyewear for a period of time.  Ask your caregiver when it will be safe to engage in sexual activity.  Continue with your regular eye exams as directed by your caregiver. What to expect:  It is normal to feel itching and mild discomfort for a few days after cataract surgery. Some fluid discharge is also common, and your eye may be sensitive to light and touch.  After 1 to 2 days, even moderate discomfort should disappear. In most cases, healing will take about 6 weeks.  If you received an intraocular lens (IOL), you may notice that colors are very bright or have a blue tinge. Also, if you have been in bright sunlight, everything may appear reddish for a few hours. If you see these color tinges, it is because your lens is clear and no longer cloudy. Within a few months after receiving an IOL, these extra colors should go away. When you have healed, you will probably need new glasses. SEEK MEDICAL CARE IF:   You have increased bruising around your eye.  You have discomfort not helped by medicine. SEEK IMMEDIATE MEDICAL CARE IF:   You have a fever.  You have a worsening or sudden vision loss.  You have redness, swelling, or increasing pain in the eye.  You have a thick discharge from the eye that had surgery. MAKE SURE YOU:  Understand these instructions.  Will watch your condition.  Will get help right away if you are not doing well or get worse. Document Released: 09/08/2004 Document Revised: 05/14/2011 Document Reviewed: 10/13/2010 [facility] Patient Information 2014 [place], [place].

## 2013-07-30 ENCOUNTER — Encounter (HOSPITAL_COMMUNITY): Payer: Self-pay

## 2013-07-30 ENCOUNTER — Encounter (HOSPITAL_COMMUNITY)
Admission: RE | Admit: 2013-07-30 | Discharge: 2013-07-30 | Disposition: A | Discharge status: Home / Self Care | Payer: Medicare Other | Source: Ambulatory Visit | Attending: Ophthalmology | Admitting: Ophthalmology

## 2013-07-30 DIAGNOSIS — Z01812 Encounter for preprocedural laboratory examination: Secondary | ICD-10-CM | POA: Insufficient documentation

## 2013-07-30 LAB — BASIC METABOLIC PANEL
BUN: 23 mg/dL (ref 6–23)
CO2: 27 mEq/L (ref 19–32)
Calcium: 8.7 mg/dL (ref 8.4–10.5)
Chloride: 103 mEq/L (ref 96–112)
Creatinine, Ser: 1.18 mg/dL — ABNORMAL HIGH (ref 0.50–1.10)
GFR calc Af Amer: 52 mL/min — ABNORMAL LOW (ref >90)
GFR calc non Af Amer: 45 mL/min — ABNORMAL LOW (ref >90)
Glucose, Bld: 102 mg/dL — ABNORMAL HIGH (ref 70–99)
Potassium: 4.4 mEq/L (ref 3.7–5.3)
Sodium: 141 mEq/L (ref 137–147)

## 2013-07-30 LAB — HEMOGLOBIN AND HEMATOCRIT, BLOOD
HCT: 31.3 % — ABNORMAL LOW (ref 36.0–46.0)
Hemoglobin: 9.4 g/dL — ABNORMAL LOW (ref 12.0–15.0)

## 2013-07-30 NOTE — Pre-Procedure Instructions (Signed)
Patient given information to sign up for my chart at home. 

## 2013-08-03 NOTE — Pre-Procedure Instructions (Signed)
Dr Gonzalez aware of H&H. No orders given. 

## 2013-08-05 MED ORDER — CYCLOPENTOLATE-PHENYLEPHRINE OP SOLN OPTIME - NO CHARGE
OPHTHALMIC | Status: AC
  Filled 2013-08-05: qty 2

## 2013-08-05 MED ORDER — PHENYLEPHRINE HCL 2.5 % OP SOLN
OPHTHALMIC | Status: AC
  Filled 2013-08-05: qty 15

## 2013-08-05 MED ORDER — NEOMYCIN-POLYMYXIN-DEXAMETH 3.5-10000-0.1 OP SUSP
OPHTHALMIC | Status: AC
  Filled 2013-08-05: qty 5

## 2013-08-05 MED ORDER — TETRACAINE HCL 0.5 % OP SOLN
OPHTHALMIC | Status: AC
  Filled 2013-08-05: qty 2

## 2013-08-06 ENCOUNTER — Encounter (HOSPITAL_COMMUNITY): Payer: Self-pay | Admitting: *Deleted

## 2013-08-06 ENCOUNTER — Encounter (HOSPITAL_COMMUNITY)
Admission: RE | Disposition: A | Discharge status: Home / Self Care | Payer: Self-pay | Source: Ambulatory Visit | Attending: Ophthalmology

## 2013-08-06 ENCOUNTER — Ambulatory Visit (HOSPITAL_COMMUNITY): Payer: Medicare Other | Admitting: Anesthesiology

## 2013-08-06 ENCOUNTER — Encounter (HOSPITAL_COMMUNITY): Payer: Medicare Other | Admitting: Anesthesiology

## 2013-08-06 ENCOUNTER — Ambulatory Visit (HOSPITAL_COMMUNITY)
Admission: RE | Admit: 2013-08-06 | Discharge: 2013-08-06 | Disposition: A | Discharge status: Home / Self Care | Payer: Medicare Other | Source: Ambulatory Visit | Attending: Ophthalmology | Admitting: Ophthalmology

## 2013-08-06 DIAGNOSIS — K219 Gastro-esophageal reflux disease without esophagitis: Secondary | ICD-10-CM | POA: Diagnosis not present

## 2013-08-06 DIAGNOSIS — H269 Unspecified cataract: Secondary | ICD-10-CM | POA: Insufficient documentation

## 2013-08-06 DIAGNOSIS — I10 Essential (primary) hypertension: Secondary | ICD-10-CM | POA: Insufficient documentation

## 2013-08-06 DIAGNOSIS — K449 Diaphragmatic hernia without obstruction or gangrene: Secondary | ICD-10-CM | POA: Diagnosis not present

## 2013-08-06 DIAGNOSIS — D649 Anemia, unspecified: Secondary | ICD-10-CM | POA: Insufficient documentation

## 2013-08-06 DIAGNOSIS — H2510 Age-related nuclear cataract, unspecified eye: Secondary | ICD-10-CM | POA: Diagnosis not present

## 2013-08-06 HISTORY — PX: CATARACT EXTRACTION W/PHACO: SHX586

## 2013-08-06 SURGERY — PHACOEMULSIFICATION, CATARACT, WITH IOL INSERTION
Anesthesia: Monitor Anesthesia Care | Site: Eye | Laterality: Left

## 2013-08-06 MED ORDER — MIDAZOLAM HCL 2 MG/2ML IJ SOLN
1.0000 mg | INTRAMUSCULAR | Status: DC | PRN
  Administered 2013-08-06: 2 mg via INTRAVENOUS

## 2013-08-06 MED ORDER — EPINEPHRINE HCL 1 MG/ML IJ SOLN
INTRAOCULAR | Status: DC | PRN
  Administered 2013-08-06: 12:00:00

## 2013-08-06 MED ORDER — MIDAZOLAM HCL 2 MG/2ML IJ SOLN
INTRAMUSCULAR | Status: AC
  Filled 2013-08-06: qty 2

## 2013-08-06 MED ORDER — LACTATED RINGERS IV SOLN
INTRAVENOUS | Status: DC
  Administered 2013-08-06 (×2): via INTRAVENOUS

## 2013-08-06 MED ORDER — POVIDONE-IODINE 5 % OP SOLN
OPHTHALMIC | Status: DC | PRN
  Administered 2013-08-06: 1 via OPHTHALMIC

## 2013-08-06 MED ORDER — LIDOCAINE HCL 3.5 % OP GEL
1.0000 "application " | Freq: Once | OPHTHALMIC | Status: AC
  Administered 2013-08-06: 1 via OPHTHALMIC

## 2013-08-06 MED ORDER — FENTANYL CITRATE 0.05 MG/ML IJ SOLN
INTRAMUSCULAR | Status: AC
  Filled 2013-08-06: qty 2

## 2013-08-06 MED ORDER — LIDOCAINE HCL (PF) 1 % IJ SOLN
INTRAMUSCULAR | Status: DC | PRN
  Administered 2013-08-06: .5 mL

## 2013-08-06 MED ORDER — NEOMYCIN-POLYMYXIN-DEXAMETH 3.5-10000-0.1 OP SUSP
OPHTHALMIC | Status: DC | PRN
  Administered 2013-08-06: 2 [drp] via OPHTHALMIC

## 2013-08-06 MED ORDER — EPINEPHRINE HCL 1 MG/ML IJ SOLN
INTRAMUSCULAR | Status: AC
  Filled 2013-08-06: qty 1

## 2013-08-06 MED ORDER — CYCLOPENTOLATE-PHENYLEPHRINE 0.2-1 % OP SOLN
1.0000 [drp] | OPHTHALMIC | Status: AC
  Administered 2013-08-06 (×3): 1 [drp] via OPHTHALMIC

## 2013-08-06 MED ORDER — ONDANSETRON HCL 4 MG/2ML IJ SOLN: 4.0000 mg | Freq: Once | INTRAMUSCULAR | Status: DC | PRN

## 2013-08-06 MED ORDER — PHENYLEPHRINE HCL 2.5 % OP SOLN
1.0000 [drp] | OPHTHALMIC | Status: AC
  Administered 2013-08-06 (×3): 1 [drp] via OPHTHALMIC

## 2013-08-06 MED ORDER — FENTANYL CITRATE 0.05 MG/ML IJ SOLN: 25.0000 ug | INTRAMUSCULAR | Status: DC | PRN

## 2013-08-06 MED ORDER — PROVISC 10 MG/ML IO SOLN
INTRAOCULAR | Status: DC | PRN
  Administered 2013-08-06: 0.85 mL via INTRAOCULAR

## 2013-08-06 MED ORDER — BSS IO SOLN
INTRAOCULAR | Status: DC | PRN
  Administered 2013-08-06: 15 mL via INTRAOCULAR

## 2013-08-06 MED ORDER — FENTANYL CITRATE 0.05 MG/ML IJ SOLN
25.0000 ug | INTRAMUSCULAR | Status: AC
  Administered 2013-08-06 (×2): 25 ug via INTRAVENOUS

## 2013-08-06 MED ORDER — TETRACAINE HCL 0.5 % OP SOLN
1.0000 [drp] | OPHTHALMIC | Status: AC
  Administered 2013-08-06 (×3): 1 [drp] via OPHTHALMIC

## 2013-08-06 SURGICAL SUPPLY — 32 items
CAPSULAR TENSION RING-AMO (OPHTHALMIC RELATED) IMPLANT
CLOTH BEACON ORANGE TIMEOUT ST (SAFETY) ×1 IMPLANT
EYE SHIELD UNIVERSAL CLEAR (GAUZE/BANDAGES/DRESSINGS) ×1 IMPLANT
GLOVE BIO SURGEON STRL SZ 6.5 (GLOVE) IMPLANT
GLOVE BIOGEL PI IND STRL 6.5 (GLOVE) IMPLANT
GLOVE BIOGEL PI IND STRL 7.0 (GLOVE) IMPLANT
GLOVE BIOGEL PI IND STRL 7.5 (GLOVE) IMPLANT
GLOVE BIOGEL PI INDICATOR 6.5 (GLOVE)
GLOVE BIOGEL PI INDICATOR 7.0 (GLOVE) ×2
GLOVE BIOGEL PI INDICATOR 7.5 (GLOVE)
GLOVE ECLIPSE 6.5 STRL STRAW (GLOVE) IMPLANT
GLOVE ECLIPSE 7.0 STRL STRAW (GLOVE) IMPLANT
GLOVE ECLIPSE 7.5 STRL STRAW (GLOVE) IMPLANT
GLOVE EXAM NITRILE LRG STRL (GLOVE) IMPLANT
GLOVE EXAM NITRILE MD LF STRL (GLOVE) IMPLANT
GLOVE SKINSENSE NS SZ6.5 (GLOVE)
GLOVE SKINSENSE NS SZ7.0 (GLOVE)
GLOVE SKINSENSE STRL SZ6.5 (GLOVE) IMPLANT
GLOVE SKINSENSE STRL SZ7.0 (GLOVE) IMPLANT
KIT VITRECTOMY (OPHTHALMIC RELATED) IMPLANT
PAD ARMBOARD 7.5X6 YLW CONV (MISCELLANEOUS) ×1 IMPLANT
PROC W NO LENS (INTRAOCULAR LENS)
PROC W SPEC LENS (INTRAOCULAR LENS)
PROCESS W NO LENS (INTRAOCULAR LENS) IMPLANT
PROCESS W SPEC LENS (INTRAOCULAR LENS) IMPLANT
RING MALYGIN (MISCELLANEOUS) IMPLANT
SIGHTPATH CAT PROC W REG LENS (Ophthalmic Related) ×2 IMPLANT
SYR TB 1ML LL NO SAFETY (SYRINGE) ×1 IMPLANT
TAPE SURG TRANSPORE 1 IN (GAUZE/BANDAGES/DRESSINGS) IMPLANT
TAPE SURGICAL TRANSPORE 1 IN (GAUZE/BANDAGES/DRESSINGS) ×1
VISCOELASTIC ADDITIONAL (OPHTHALMIC RELATED) IMPLANT
WATER STERILE IRR 250ML POUR (IV SOLUTION) ×1 IMPLANT

## 2013-08-06 NOTE — Discharge Instructions (Signed)

## 2013-08-06 NOTE — Anesthesia Postprocedure Evaluation (Signed)
  Anesthesia Post-op Note  Patient: [name] [name]  Procedure(s) Performed: Procedure(s) with comments: CATARACT EXTRACTION PHACO AND INTRAOCULAR LENS PLACEMENT LEFT EYE (Left) - CDE: 9.55  Patient Location: Short Stay  Anesthesia Type:MAC  Level of Consciousness: awake, alert  and oriented  Airway and Oxygen Therapy: Patient Spontanous Breathing  Post-op Pain: none  Post-op Assessment: Post-op Vital signs reviewed, Patient's Cardiovascular Status Stable, PATIENT'S CARDIOVASCULAR STATUS UNSTABLE, Patent Airway and No signs of Nausea or vomiting  Post-op Vital Signs: Reviewed and stable  Last Vitals:  Filed Vitals:   08/06/13 1140  BP: 162/69  Temp:   Resp: 23    Complications: No apparent anesthesia complications

## 2013-08-06 NOTE — Op Note (Signed)
Date of Admission: 08/06/2013  Date of Surgery: 08/06/2013   Pre-Op Dx: Cataract Left Eye  Post-Op Dx: Cataract Left  Eye,  Dx Code 366.16  Surgeon: Tonny Branch, M.D.  Assistants: None  Anesthesia: Topical with MAC  Indications: Painless, progressive loss of vision with compromise of daily activities.  Surgery: Cataract Extraction with Intraocular lens Implant Left Eye  Discription: The patient had dilating drops and viscous lidocaine placed into the Left eye in the pre-op holding area. After transfer to the operating room, a time out was performed. The patient was then prepped and draped. Beginning with a 69 degree blade a paracentesis port was made at the surgeon's 2 o'clock position. The anterior chamber was then filled with 1% non-preserved lidocaine. This was followed by filling the anterior chamber with Provisc.  A 2.34mm keratome blade was used to make a clear corneal incision at the temporal limbus.  A bent cystatome needle was used to create a continuous tear capsulotomy. Hydrodissection was performed with balanced salt solution on a Fine canula. The lens nucleus was then removed using the phacoemulsification handpiece. Residual cortex was removed with the I&A handpiece. The anterior chamber and capsular bag were refilled with Provisc. A posterior chamber intraocular lens was placed into the capsular bag with it's injector. The implant was positioned with the Kuglan hook. The Provisc was then removed from the anterior chamber and capsular bag with the I&A handpiece. Stromal hydration of the main incision and paracentesis port was performed with BSS on a Fine canula. The wounds were tested for leak which was negative. The patient tolerated the procedure well. There were no operative complications. The patient was then transferred to the recovery room in stable condition.  Complications: None  Specimen: None  EBL: None  Prosthetic device: Hoya iSert 250, power 17.0 D, SN [id number].

## 2013-08-06 NOTE — Anesthesia Preprocedure Evaluation (Signed)
Anesthesia Evaluation  Patient identified by MRN, date of birth, ID band Patient awake    Reviewed: Allergy & Precautions, H&P , NPO status , Patient's Chart, lab work & pertinent test results  History of Anesthesia Complications (+) PONV and history of anesthetic complications  Airway Mallampati: I TM Distance: >3 FB     Dental  (+) Dental Advidsory Given, Edentulous Upper, Poor Dentition   Pulmonary neg pulmonary ROS,  breath sounds clear to auscultation  Pulmonary exam normal       Cardiovascular hypertension, Pt. on medications Rhythm:Regular Rate:Normal     Neuro/Psych PSYCHIATRIC DISORDERS Anxiety Depression negative neurological ROS     GI/Hepatic hiatal hernia, GERD-  Medicated,  Endo/Other    Renal/GU negative Renal ROS     Musculoskeletal   Abdominal   Peds  Hematology  (+) anemia ,   Anesthesia Other Findings   Reproductive/Obstetrics                           Anesthesia Physical Anesthesia Plan  ASA: II  Anesthesia Plan: MAC   Post-op Pain Management:    Induction: Intravenous  Airway Management Planned: Nasal Cannula  Additional Equipment:   Intra-op Plan:   Post-operative Plan:   Informed Consent: I have reviewed the patients History and Physical, chart, labs and discussed the procedure including the risks, benefits and alternatives for the proposed anesthesia with the patient or authorized representative who has indicated his/her understanding and acceptance.     Plan Discussed with:   Anesthesia Plan Comments:         Anesthesia Quick Evaluation  

## 2013-08-06 NOTE — Transfer of Care (Signed)
Immediate Anesthesia Transfer of Care Note  Patient: [name] [name]  Procedure(s) Performed: Procedure(s) with comments: CATARACT EXTRACTION PHACO AND INTRAOCULAR LENS PLACEMENT LEFT EYE (Left) - CDE: 9.55  Patient Location: Short Stay  Anesthesia Type:MAC  Level of Consciousness: awake  Airway & Oxygen Therapy: Patient Spontanous Breathing  Post-op Assessment: Report given to PACU RN  Post vital signs: Reviewed and stable  Complications: No apparent anesthesia complications

## 2013-08-06 NOTE — H&P (Signed)
I have reviewed the H&P, the patient was re-examined, and I have identified no interval changes in medical condition and plan of care since the history and physical of record  

## 2013-08-07 ENCOUNTER — Encounter (HOSPITAL_COMMUNITY): Payer: Self-pay | Admitting: Ophthalmology

## 2013-08-13 DIAGNOSIS — H2510 Age-related nuclear cataract, unspecified eye: Secondary | ICD-10-CM | POA: Diagnosis not present

## 2013-08-18 ENCOUNTER — Encounter (HOSPITAL_COMMUNITY): Payer: Self-pay | Admitting: Pharmacy Technician

## 2013-08-19 ENCOUNTER — Encounter (HOSPITAL_COMMUNITY)
Admission: RE | Admit: 2013-08-19 | Discharge: 2013-08-19 | Disposition: A | Discharge status: Home / Self Care | Payer: Medicare Other | Source: Ambulatory Visit | Attending: Ophthalmology | Admitting: Ophthalmology

## 2013-08-24 ENCOUNTER — Encounter (HOSPITAL_COMMUNITY): Payer: Self-pay | Admitting: *Deleted

## 2013-08-24 ENCOUNTER — Encounter (HOSPITAL_COMMUNITY): Payer: Medicare Other | Admitting: Anesthesiology

## 2013-08-24 ENCOUNTER — Ambulatory Visit (HOSPITAL_COMMUNITY)
Admission: RE | Admit: 2013-08-24 | Discharge: 2013-08-24 | Disposition: A | Discharge status: Home / Self Care | Payer: Medicare Other | Source: Ambulatory Visit | Attending: Ophthalmology | Admitting: Ophthalmology

## 2013-08-24 ENCOUNTER — Ambulatory Visit (HOSPITAL_COMMUNITY): Payer: Medicare Other | Admitting: Anesthesiology

## 2013-08-24 ENCOUNTER — Encounter (HOSPITAL_COMMUNITY)
Admission: RE | Disposition: A | Discharge status: Home / Self Care | Payer: Self-pay | Source: Ambulatory Visit | Attending: Ophthalmology

## 2013-08-24 DIAGNOSIS — K449 Diaphragmatic hernia without obstruction or gangrene: Secondary | ICD-10-CM | POA: Diagnosis not present

## 2013-08-24 DIAGNOSIS — D649 Anemia, unspecified: Secondary | ICD-10-CM | POA: Insufficient documentation

## 2013-08-24 DIAGNOSIS — F411 Generalized anxiety disorder: Secondary | ICD-10-CM | POA: Insufficient documentation

## 2013-08-24 DIAGNOSIS — F329 Major depressive disorder, single episode, unspecified: Secondary | ICD-10-CM | POA: Diagnosis not present

## 2013-08-24 DIAGNOSIS — H269 Unspecified cataract: Secondary | ICD-10-CM | POA: Insufficient documentation

## 2013-08-24 DIAGNOSIS — K219 Gastro-esophageal reflux disease without esophagitis: Secondary | ICD-10-CM | POA: Insufficient documentation

## 2013-08-24 DIAGNOSIS — H2510 Age-related nuclear cataract, unspecified eye: Secondary | ICD-10-CM | POA: Diagnosis not present

## 2013-08-24 DIAGNOSIS — I10 Essential (primary) hypertension: Secondary | ICD-10-CM | POA: Diagnosis not present

## 2013-08-24 DIAGNOSIS — F3289 Other specified depressive episodes: Secondary | ICD-10-CM | POA: Diagnosis not present

## 2013-08-24 HISTORY — PX: CATARACT EXTRACTION W/PHACO: SHX586

## 2013-08-24 SURGERY — PHACOEMULSIFICATION, CATARACT, WITH IOL INSERTION
Anesthesia: Monitor Anesthesia Care | Site: Eye | Laterality: Right

## 2013-08-24 MED ORDER — LIDOCAINE HCL 3.5 % OP GEL
OPHTHALMIC | Status: AC
  Filled 2013-08-24: qty 1

## 2013-08-24 MED ORDER — MIDAZOLAM HCL 2 MG/2ML IJ SOLN
1.0000 mg | INTRAMUSCULAR | Status: DC | PRN
  Administered 2013-08-24 (×2): 2 mg via INTRAVENOUS
  Filled 2013-08-24: qty 2

## 2013-08-24 MED ORDER — CYCLOPENTOLATE-PHENYLEPHRINE OP SOLN OPTIME - NO CHARGE
OPHTHALMIC | Status: AC
  Filled 2013-08-24: qty 2

## 2013-08-24 MED ORDER — PROVISC 10 MG/ML IO SOLN
INTRAOCULAR | Status: DC | PRN
  Administered 2013-08-24: 0.85 mL via INTRAOCULAR

## 2013-08-24 MED ORDER — FENTANYL CITRATE 0.05 MG/ML IJ SOLN
25.0000 ug | INTRAMUSCULAR | Status: AC
  Administered 2013-08-24: 25 ug via INTRAVENOUS

## 2013-08-24 MED ORDER — ONDANSETRON HCL 4 MG/2ML IJ SOLN
INTRAMUSCULAR | Status: AC
  Filled 2013-08-24: qty 2

## 2013-08-24 MED ORDER — PHENYLEPHRINE HCL 2.5 % OP SOLN
OPHTHALMIC | Status: AC
  Filled 2013-08-24: qty 15

## 2013-08-24 MED ORDER — FENTANYL CITRATE 0.05 MG/ML IJ SOLN
INTRAMUSCULAR | Status: AC
  Filled 2013-08-24: qty 2

## 2013-08-24 MED ORDER — LIDOCAINE HCL (PF) 1 % IJ SOLN
INTRAMUSCULAR | Status: DC | PRN
  Administered 2013-08-24: .5 mL

## 2013-08-24 MED ORDER — LIDOCAINE 3.5 % OP GEL OPTIME - NO CHARGE
OPHTHALMIC | Status: DC | PRN
  Administered 2013-08-24: 1 [drp] via OPHTHALMIC

## 2013-08-24 MED ORDER — ONDANSETRON HCL 4 MG/2ML IJ SOLN
4.0000 mg | Freq: Once | INTRAMUSCULAR | Status: AC
  Administered 2013-08-24: 4 mg via INTRAVENOUS

## 2013-08-24 MED ORDER — TETRACAINE HCL 0.5 % OP SOLN
OPHTHALMIC | Status: AC
  Filled 2013-08-24: qty 2

## 2013-08-24 MED ORDER — CYCLOPENTOLATE-PHENYLEPHRINE 0.2-1 % OP SOLN
1.0000 [drp] | OPHTHALMIC | Status: AC
  Administered 2013-08-24 (×3): 1 [drp] via OPHTHALMIC

## 2013-08-24 MED ORDER — LIDOCAINE HCL 3.5 % OP GEL
1.0000 | Freq: Once | OPHTHALMIC | Status: AC
Start: 2013-08-24 — End: 2013-08-24
  Administered 2013-08-24: 1 via OPHTHALMIC

## 2013-08-24 MED ORDER — BSS IO SOLN
INTRAOCULAR | Status: DC | PRN
  Administered 2013-08-24: 15 mL via INTRAOCULAR

## 2013-08-24 MED ORDER — MIDAZOLAM HCL 2 MG/2ML IJ SOLN
INTRAMUSCULAR | Status: AC
  Filled 2013-08-24: qty 2

## 2013-08-24 MED ORDER — LIDOCAINE HCL (PF) 1 % IJ SOLN
INTRAMUSCULAR | Status: AC
  Filled 2013-08-24: qty 2

## 2013-08-24 MED ORDER — EPINEPHRINE HCL 1 MG/ML IJ SOLN
INTRAMUSCULAR | Status: AC
  Filled 2013-08-24: qty 1

## 2013-08-24 MED ORDER — PHENYLEPHRINE HCL 2.5 % OP SOLN
1.0000 [drp] | OPHTHALMIC | Status: AC
  Administered 2013-08-24 (×3): 1 [drp] via OPHTHALMIC

## 2013-08-24 MED ORDER — TETRACAINE HCL 0.5 % OP SOLN
1.0000 [drp] | OPHTHALMIC | Status: AC
  Administered 2013-08-24 (×3): 1 [drp] via OPHTHALMIC

## 2013-08-24 MED ORDER — NEOMYCIN-POLYMYXIN-DEXAMETH 3.5-10000-0.1 OP SUSP
OPHTHALMIC | Status: AC
  Filled 2013-08-24: qty 5

## 2013-08-24 MED ORDER — POVIDONE-IODINE 5 % OP SOLN
OPHTHALMIC | Status: DC | PRN
  Administered 2013-08-24: 1 via OPHTHALMIC

## 2013-08-24 MED ORDER — LACTATED RINGERS IV SOLN
INTRAVENOUS | Status: DC
  Administered 2013-08-24: 12:00:00 via INTRAVENOUS

## 2013-08-24 MED ORDER — NEOMYCIN-POLYMYXIN-DEXAMETH 3.5-10000-0.1 OP SUSP
OPHTHALMIC | Status: DC | PRN
  Administered 2013-08-24: 1 [drp] via OPHTHALMIC

## 2013-08-24 MED ORDER — EPINEPHRINE HCL 1 MG/ML IJ SOLN
INTRAOCULAR | Status: DC | PRN
  Administered 2013-08-24: 12:00:00

## 2013-08-24 SURGICAL SUPPLY — 35 items
CAPSULAR TENSION RING-AMO (OPHTHALMIC RELATED) IMPLANT
CLOTH BEACON ORANGE TIMEOUT ST (SAFETY) ×1 IMPLANT
EYE SHIELD UNIVERSAL CLEAR (GAUZE/BANDAGES/DRESSINGS) ×1 IMPLANT
GLOVE BIO SURGEON STRL SZ 6.5 (GLOVE) IMPLANT
GLOVE BIOGEL PI IND STRL 6.5 (GLOVE) IMPLANT
GLOVE BIOGEL PI IND STRL 7.0 (GLOVE) IMPLANT
GLOVE BIOGEL PI IND STRL 7.5 (GLOVE) IMPLANT
GLOVE BIOGEL PI INDICATOR 6.5 (GLOVE)
GLOVE BIOGEL PI INDICATOR 7.0 (GLOVE)
GLOVE BIOGEL PI INDICATOR 7.5 (GLOVE)
GLOVE ECLIPSE 6.5 STRL STRAW (GLOVE) IMPLANT
GLOVE ECLIPSE 7.0 STRL STRAW (GLOVE) IMPLANT
GLOVE ECLIPSE 7.5 STRL STRAW (GLOVE) IMPLANT
GLOVE EXAM NITRILE LRG STRL (GLOVE) IMPLANT
GLOVE EXAM NITRILE MD LF STRL (GLOVE) IMPLANT
GLOVE SKINSENSE NS SZ6.5 (GLOVE)
GLOVE SKINSENSE NS SZ7.0 (GLOVE)
GLOVE SKINSENSE NS SZ7.5 (GLOVE) ×1
GLOVE SKINSENSE STRL SZ6.5 (GLOVE) IMPLANT
GLOVE SKINSENSE STRL SZ7.0 (GLOVE) IMPLANT
GLOVE SKINSENSE STRL SZ7.5 (GLOVE) IMPLANT
GLOVE SS N UNI LF 7.0 STRL (GLOVE) ×2 IMPLANT
KIT VITRECTOMY (OPHTHALMIC RELATED) IMPLANT
PAD ARMBOARD 7.5X6 YLW CONV (MISCELLANEOUS) ×1 IMPLANT
PROC W NO LENS (INTRAOCULAR LENS)
PROC W SPEC LENS (INTRAOCULAR LENS)
PROCESS W NO LENS (INTRAOCULAR LENS) IMPLANT
PROCESS W SPEC LENS (INTRAOCULAR LENS) IMPLANT
RING MALYGIN (MISCELLANEOUS) IMPLANT
SIGHTPATH CAT PROC W REG LENS (Ophthalmic Related) ×2 IMPLANT
SYR TB 1ML LL NO SAFETY (SYRINGE) ×1 IMPLANT
TAPE SURG TRANSPARENT 2IN (GAUZE/BANDAGES/DRESSINGS) IMPLANT
TAPE TRANSPARENT 2IN (GAUZE/BANDAGES/DRESSINGS) ×1
VISCOELASTIC ADDITIONAL (OPHTHALMIC RELATED) IMPLANT
WATER STERILE IRR 250ML POUR (IV SOLUTION) ×1 IMPLANT

## 2013-08-24 NOTE — Transfer of Care (Signed)
Immediate Anesthesia Transfer of Care Note  Patient: [name] [name]  Procedure(s) Performed: Procedure(s) with comments: CATARACT EXTRACTION PHACO AND INTRAOCULAR LENS PLACEMENT (IOC) (Right) - CDE:8.30  Patient Location: Short Stay  Anesthesia Type:MAC  Level of Consciousness: awake, alert , oriented and patient cooperative  Airway & Oxygen Therapy: Patient Spontanous Breathing  Post-op Assessment: Report given to PACU RN and Post -op Vital signs reviewed and stable  Post vital signs: Reviewed and stable  Complications: No apparent anesthesia complications

## 2013-08-24 NOTE — Discharge Instructions (Signed)
Cataract Surgery °Care After °Refer to this sheet in the next few weeks. These instructions provide you with information on caring for yourself after your procedure. Your caregiver may also give you more specific instructions. Your treatment has been planned according to current medical practices, but problems sometimes occur. Call your caregiver if you have any problems or questions after your procedure.  °HOME CARE INSTRUCTIONS  °· Avoid strenuous activities as directed by your caregiver. °· Ask your caregiver when you can resume driving. °· Use eyedrops or other medicines to help healing and control pressure inside your eye as directed by your caregiver. °· Only take over-the-counter or prescription medicines for pain, discomfort, or fever as directed by your caregiver. °· Do not to touch or rub your eyes. °· You may be instructed to use a protective shield during the first few days and nights after surgery. If not, wear sunglasses to protect your eyes. This is to protect the eye from pressure or from being accidentally bumped. °· Keep the area around your eye clean and dry. Avoid swimming or allowing water to hit you directly in the face while showering. Keep soap and shampoo out of your eyes. °· Do not bend or lift heavy objects. Bending increases pressure in the eye. You can walk, climb stairs, and do light household chores. °· Do not put a contact lens into the eye that had surgery until your caregiver says it is okay to do so. °· Ask your doctor when you can return to work. This will depend on the kind of work that you do. If you work in a dusty environment, you may be advised to wear protective eyewear for a period of time. °· Ask your caregiver when it will be safe to engage in sexual activity. °· Continue with your regular eye exams as directed by your caregiver. °What to expect: °· It is normal to feel itching and mild discomfort for a few days after cataract surgery. Some fluid discharge is also common,  and your eye may be sensitive to light and touch. °· After 1 to 2 days, even moderate discomfort should disappear. In most cases, healing will take about 6 weeks. °· If you received an intraocular lens (IOL), you may notice that colors are very bright or have a blue tinge. Also, if you have been in bright sunlight, everything may appear reddish for a few hours. If you see these color tinges, it is because your lens is clear and no longer cloudy. Within a few months after receiving an IOL, these extra colors should go away. When you have healed, you will probably need new glasses. °SEEK MEDICAL CARE IF:  °· You have increased bruising around your eye. °· You have discomfort not helped by medicine. °SEEK IMMEDIATE MEDICAL CARE IF:  °· You have a  fever. °· You have a worsening or sudden vision loss. °· You have redness, swelling, or increasing pain in the eye. °· You have a thick discharge from the eye that had surgery. °MAKE SURE YOU: °· Understand these instructions. °· Will watch your condition. °· Will get help right away if you are not doing well or get worse. °Document Released: 09/08/2004 Document Revised: 05/14/2011 Document Reviewed: 10/13/2010 °[employer]® Patient Information ©2015 [employer]. This information is not intended to replace advice given to you by your health care provider. Make sure you discuss any questions you have with your health care provider. ° °

## 2013-08-24 NOTE — Anesthesia Postprocedure Evaluation (Signed)
  Anesthesia Post-op Note  Patient: [name] [name]  Procedure(s) Performed: Procedure(s) with comments: CATARACT EXTRACTION PHACO AND INTRAOCULAR LENS PLACEMENT (IOC) (Right) - CDE:8.30  Patient Location: Short Stay  Anesthesia Type:MAC  Level of Consciousness: awake, alert , oriented and patient cooperative  Airway and Oxygen Therapy: Patient Spontanous Breathing  Post-op Pain: none  Post-op Assessment: Post-op Vital signs reviewed, Patient's Cardiovascular Status Stable, Respiratory Function Stable, Patent Airway, No signs of Nausea or vomiting and Pain level controlled  Post-op Vital Signs: Reviewed and stable  Last Vitals:  Filed Vitals:   08/24/13 1200  BP: 153/64  Pulse:   Temp:   Resp: 19    Complications: No apparent anesthesia complications

## 2013-08-24 NOTE — H&P (Signed)
I have reviewed the H&P, the patient was re-examined, and I have identified no interval changes in medical condition and plan of care since the history and physical of record  

## 2013-08-24 NOTE — Anesthesia Procedure Notes (Signed)
Procedure Name: MAC Date/Time: 08/24/2013 12:21 PM Performed by: Vista Deck Pre-anesthesia Checklist: Patient identified, Emergency Drugs available, Suction available, Timeout performed and Patient being monitored Patient Re-evaluated:Patient Re-evaluated prior to inductionOxygen Delivery Method: Nasal Cannula

## 2013-08-24 NOTE — Anesthesia Preprocedure Evaluation (Signed)
Anesthesia Evaluation  Patient identified by MRN, date of birth, ID band Patient awake    Reviewed: Allergy & Precautions, H&P , NPO status , Patient's Chart, lab work & pertinent test results  History of Anesthesia Complications (+) PONV and history of anesthetic complications  Airway Mallampati: I TM Distance: >3 FB     Dental  (+) Dental Advidsory Given, Edentulous Upper, Poor Dentition   Pulmonary neg pulmonary ROS,  breath sounds clear to auscultation  Pulmonary exam normal       Cardiovascular hypertension, Pt. on medications Rhythm:Regular Rate:Normal     Neuro/Psych PSYCHIATRIC DISORDERS Anxiety Depression negative neurological ROS     GI/Hepatic hiatal hernia, GERD-  Medicated,  Endo/Other    Renal/GU negative Renal ROS     Musculoskeletal   Abdominal   Peds  Hematology  (+) anemia ,   Anesthesia Other Findings   Reproductive/Obstetrics                           Anesthesia Physical Anesthesia Plan  ASA: II  Anesthesia Plan: MAC   Post-op Pain Management:    Induction: Intravenous  Airway Management Planned: Nasal Cannula  Additional Equipment:   Intra-op Plan:   Post-operative Plan:   Informed Consent: I have reviewed the patients History and Physical, chart, labs and discussed the procedure including the risks, benefits and alternatives for the proposed anesthesia with the patient or authorized representative who has indicated his/her understanding and acceptance.     Plan Discussed with:   Anesthesia Plan Comments:         Anesthesia Quick Evaluation

## 2013-08-24 NOTE — Op Note (Signed)
Date of Admission: 08/24/2013  Date of Surgery: 08/24/2013   Pre-Op Dx: Cataract Right Eye  Post-Op Dx: Cataract Right  Eye,  Dx Code 366.16  Surgeon: Tonny Branch, M.D.  Assistants: None  Anesthesia: Topical with MAC  Indications: Painless, progressive loss of vision with compromise of daily activities.  Surgery: Cataract Extraction with Intraocular lens Implant Right Eye  Discription: The patient had dilating drops and viscous lidocaine placed into the Right eye in the pre-op holding area. After transfer to the operating room, a time out was performed. The patient was then prepped and draped. Beginning with a 49 degree blade a paracentesis port was made at the surgeon's 2 o'clock position. The anterior chamber was then filled with 1% non-preserved lidocaine. This was followed by filling the anterior chamber with Provisc.  A 2.59mm keratome blade was used to make a clear corneal incision at the temporal limbus.  A bent cystatome needle was used to create a continuous tear capsulotomy. Hydrodissection was performed with balanced salt solution on a Fine canula. The lens nucleus was then removed using the phacoemulsification handpiece. Residual cortex was removed with the I&A handpiece. The anterior chamber and capsular bag were refilled with Provisc. A posterior chamber intraocular lens was placed into the capsular bag with it's injector. The implant was positioned with the Kuglan hook. The Provisc was then removed from the anterior chamber and capsular bag with the I&A handpiece. Stromal hydration of the main incision and paracentesis port was performed with BSS on a Fine canula. The wounds were tested for leak which was negative. The patient tolerated the procedure well. There were no operative complications. The patient was then transferred to the recovery room in stable condition.  Complications: None  Specimen: None  EBL: None  Prosthetic device: Hoya iSert 250, power 17.0 D, SN  [id number].

## 2013-08-25 ENCOUNTER — Encounter (HOSPITAL_COMMUNITY): Payer: Self-pay | Admitting: Ophthalmology

## 2013-09-08 DIAGNOSIS — I10 Essential (primary) hypertension: Secondary | ICD-10-CM | POA: Diagnosis not present

## 2013-09-08 DIAGNOSIS — Z6838 Body mass index (BMI) 38.0-38.9, adult: Secondary | ICD-10-CM | POA: Diagnosis not present

## 2013-09-08 DIAGNOSIS — M542 Cervicalgia: Secondary | ICD-10-CM | POA: Diagnosis not present

## 2013-09-09 ENCOUNTER — Encounter (HOSPITAL_COMMUNITY): Payer: Self-pay | Admitting: Ophthalmology

## 2013-09-24 DIAGNOSIS — IMO0002 Reserved for concepts with insufficient information to code with codable children: Secondary | ICD-10-CM | POA: Diagnosis not present

## 2013-09-24 DIAGNOSIS — M4802 Spinal stenosis, cervical region: Secondary | ICD-10-CM | POA: Diagnosis not present

## 2013-09-24 DIAGNOSIS — R209 Unspecified disturbances of skin sensation: Secondary | ICD-10-CM | POA: Diagnosis not present

## 2013-09-24 DIAGNOSIS — M47817 Spondylosis without myelopathy or radiculopathy, lumbosacral region: Secondary | ICD-10-CM | POA: Diagnosis not present

## 2013-10-01 ENCOUNTER — Emergency Department (HOSPITAL_COMMUNITY): Payer: Medicare Other

## 2013-10-01 ENCOUNTER — Encounter (HOSPITAL_COMMUNITY): Payer: Self-pay | Admitting: Emergency Medicine

## 2013-10-01 ENCOUNTER — Inpatient Hospital Stay (HOSPITAL_COMMUNITY)
Admission: EM | Admit: 2013-10-01 | Discharge: 2013-10-03 | DRG: 871 | Disposition: A | Discharge status: Home / Self Care | Payer: Medicare Other | Attending: Internal Medicine | Admitting: Internal Medicine

## 2013-10-01 ENCOUNTER — Observation Stay (HOSPITAL_COMMUNITY): Payer: Medicare Other

## 2013-10-01 DIAGNOSIS — J9600 Acute respiratory failure, unspecified whether with hypoxia or hypercapnia: Secondary | ICD-10-CM

## 2013-10-01 DIAGNOSIS — D649 Anemia, unspecified: Secondary | ICD-10-CM

## 2013-10-01 DIAGNOSIS — Z6835 Body mass index (BMI) 35.0-35.9, adult: Secondary | ICD-10-CM | POA: Diagnosis not present

## 2013-10-01 DIAGNOSIS — I10 Essential (primary) hypertension: Secondary | ICD-10-CM | POA: Diagnosis present

## 2013-10-01 DIAGNOSIS — Z981 Arthrodesis status: Secondary | ICD-10-CM | POA: Diagnosis not present

## 2013-10-01 DIAGNOSIS — R0902 Hypoxemia: Secondary | ICD-10-CM | POA: Diagnosis present

## 2013-10-01 DIAGNOSIS — Z96659 Presence of unspecified artificial knee joint: Secondary | ICD-10-CM | POA: Diagnosis not present

## 2013-10-01 DIAGNOSIS — Z8701 Personal history of pneumonia (recurrent): Secondary | ICD-10-CM

## 2013-10-01 DIAGNOSIS — F419 Anxiety disorder, unspecified: Secondary | ICD-10-CM | POA: Diagnosis present

## 2013-10-01 DIAGNOSIS — M129 Arthropathy, unspecified: Secondary | ICD-10-CM | POA: Diagnosis present

## 2013-10-01 DIAGNOSIS — F411 Generalized anxiety disorder: Secondary | ICD-10-CM | POA: Diagnosis present

## 2013-10-01 DIAGNOSIS — Z96649 Presence of unspecified artificial hip joint: Secondary | ICD-10-CM

## 2013-10-01 DIAGNOSIS — D638 Anemia in other chronic diseases classified elsewhere: Secondary | ICD-10-CM | POA: Diagnosis present

## 2013-10-01 DIAGNOSIS — R9389 Abnormal findings on diagnostic imaging of other specified body structures: Secondary | ICD-10-CM | POA: Diagnosis present

## 2013-10-01 DIAGNOSIS — A419 Sepsis, unspecified organism: Secondary | ICD-10-CM | POA: Diagnosis present

## 2013-10-01 DIAGNOSIS — R042 Hemoptysis: Secondary | ICD-10-CM | POA: Diagnosis present

## 2013-10-01 DIAGNOSIS — Z96619 Presence of unspecified artificial shoulder joint: Secondary | ICD-10-CM | POA: Diagnosis not present

## 2013-10-01 DIAGNOSIS — J9601 Acute respiratory failure with hypoxia: Secondary | ICD-10-CM | POA: Diagnosis present

## 2013-10-01 DIAGNOSIS — Z6838 Body mass index (BMI) 38.0-38.9, adult: Secondary | ICD-10-CM

## 2013-10-01 DIAGNOSIS — K219 Gastro-esophageal reflux disease without esophagitis: Secondary | ICD-10-CM | POA: Diagnosis present

## 2013-10-01 DIAGNOSIS — D72829 Elevated white blood cell count, unspecified: Secondary | ICD-10-CM | POA: Diagnosis present

## 2013-10-01 DIAGNOSIS — J189 Pneumonia, unspecified organism: Secondary | ICD-10-CM

## 2013-10-01 DIAGNOSIS — R0602 Shortness of breath: Secondary | ICD-10-CM | POA: Diagnosis not present

## 2013-10-01 HISTORY — DX: Obesity, unspecified: E66.9

## 2013-10-01 LAB — RETICULOCYTES
RBC.: 3.54 MIL/uL — ABNORMAL LOW (ref 3.87–5.11)
Retic Count, Absolute: 38.9 10*3/uL (ref 19.0–186.0)
Retic Ct Pct: 1.1 % (ref 0.4–3.1)

## 2013-10-01 LAB — CBC WITH DIFFERENTIAL/PLATELET
Basophils Absolute: 0 10*3/uL (ref 0.0–0.1)
Basophils Relative: 0 % (ref 0–1)
Eosinophils Absolute: 0.1 10*3/uL (ref 0.0–0.7)
Eosinophils Relative: 1 % (ref 0–5)
HCT: 30.8 % — ABNORMAL LOW (ref 36.0–46.0)
Hemoglobin: 9.5 g/dL — ABNORMAL LOW (ref 12.0–15.0)
Lymphocytes Relative: 4 % — ABNORMAL LOW (ref 12–46)
Lymphs Abs: 0.5 10*3/uL — ABNORMAL LOW (ref 0.7–4.0)
MCH: 24.6 pg — ABNORMAL LOW (ref 26.0–34.0)
MCHC: 30.8 g/dL (ref 30.0–36.0)
MCV: 79.8 fL (ref 78.0–100.0)
Monocytes Absolute: 0.8 10*3/uL (ref 0.1–1.0)
Monocytes Relative: 7 % (ref 3–12)
Neutro Abs: 9.7 10*3/uL — ABNORMAL HIGH (ref 1.7–7.7)
Neutrophils Relative %: 88 % — ABNORMAL HIGH (ref 43–77)
Platelets: 198 10*3/uL (ref 150–400)
RBC: 3.86 MIL/uL — ABNORMAL LOW (ref 3.87–5.11)
RDW: 16.7 % — ABNORMAL HIGH (ref 11.5–15.5)
WBC: 11.1 10*3/uL — ABNORMAL HIGH (ref 4.0–10.5)

## 2013-10-01 LAB — BASIC METABOLIC PANEL
Anion gap: 12 (ref 5–15)
BUN: 21 mg/dL (ref 6–23)
CO2: 22 mEq/L (ref 19–32)
Calcium: 8.2 mg/dL — ABNORMAL LOW (ref 8.4–10.5)
Chloride: 107 mEq/L (ref 96–112)
Creatinine, Ser: 0.98 mg/dL (ref 0.50–1.10)
GFR calc Af Amer: 65 mL/min — ABNORMAL LOW (ref >90)
GFR calc non Af Amer: 56 mL/min — ABNORMAL LOW (ref >90)
Glucose, Bld: 113 mg/dL — ABNORMAL HIGH (ref 70–99)
Potassium: 3.8 mEq/L (ref 3.7–5.3)
Sodium: 141 mEq/L (ref 137–147)

## 2013-10-01 LAB — URINALYSIS, ROUTINE W REFLEX MICROSCOPIC
Bilirubin Urine: NEGATIVE
Glucose, UA: NEGATIVE mg/dL
Hgb urine dipstick: NEGATIVE
Ketones, ur: NEGATIVE mg/dL
Leukocytes, UA: NEGATIVE
Nitrite: NEGATIVE
Protein, ur: NEGATIVE mg/dL
Specific Gravity, Urine: 1.015 (ref 1.005–1.030)
Urobilinogen, UA: 0.2 mg/dL (ref 0.0–1.0)
pH: 5.5 (ref 5.0–8.0)

## 2013-10-01 LAB — LACTIC ACID, PLASMA: Lactic Acid, Venous: 1.3 mmol/L (ref 0.5–2.2)

## 2013-10-01 LAB — IRON AND TIBC
Iron: <10 ug/dL — ABNORMAL LOW (ref 42–135)
UIBC: 345 ug/dL (ref 125–400)

## 2013-10-01 LAB — TROPONIN I: Troponin I: <0.3 ng/mL (ref <0.30)

## 2013-10-01 MED ORDER — IBUPROFEN 400 MG PO TABS
200.0000 mg | ORAL_TABLET | Freq: Four times a day (QID) | ORAL | Status: DC | PRN
  Administered 2013-10-01 – 2013-10-02 (×2): 200 mg via ORAL
  Filled 2013-10-01 (×2): qty 1

## 2013-10-01 MED ORDER — ENOXAPARIN SODIUM 40 MG/0.4ML ~~LOC~~ SOLN
40.0000 mg | SUBCUTANEOUS | Status: DC
  Administered 2013-10-01 – 2013-10-03 (×3): 40 mg via SUBCUTANEOUS
  Filled 2013-10-01 (×3): qty 0.4

## 2013-10-01 MED ORDER — ALBUTEROL SULFATE (2.5 MG/3ML) 0.083% IN NEBU
2.5000 mg | INHALATION_SOLUTION | RESPIRATORY_TRACT | Status: AC
  Administered 2013-10-01 (×3): 2.5 mg via RESPIRATORY_TRACT
  Filled 2013-10-01 (×3): qty 3

## 2013-10-01 MED ORDER — KETOROLAC TROMETHAMINE 0.5 % OP SOLN
1.0000 [drp] | Freq: Four times a day (QID) | OPHTHALMIC | Status: DC
  Administered 2013-10-01 – 2013-10-03 (×9): 1 [drp] via OPHTHALMIC
  Filled 2013-10-01: qty 3

## 2013-10-01 MED ORDER — DIAZEPAM 5 MG PO TABS: 5.0000 mg | ORAL_TABLET | Freq: Three times a day (TID) | ORAL | Status: DC | PRN

## 2013-10-01 MED ORDER — SERTRALINE HCL 50 MG PO TABS
100.0000 mg | ORAL_TABLET | Freq: Every day | ORAL | Status: DC
  Administered 2013-10-01 – 2013-10-03 (×3): 100 mg via ORAL
  Filled 2013-10-01 (×3): qty 2

## 2013-10-01 MED ORDER — PANTOPRAZOLE SODIUM 40 MG PO TBEC
40.0000 mg | DELAYED_RELEASE_TABLET | Freq: Every day | ORAL | Status: DC
  Administered 2013-10-01 – 2013-10-03 (×3): 40 mg via ORAL
  Filled 2013-10-01 (×3): qty 1

## 2013-10-01 MED ORDER — IPRATROPIUM-ALBUTEROL 0.5-2.5 (3) MG/3ML IN SOLN
3.0000 mL | Freq: Once | RESPIRATORY_TRACT | Status: AC
  Administered 2013-10-01: 3 mL via RESPIRATORY_TRACT
  Filled 2013-10-01: qty 3

## 2013-10-01 MED ORDER — SODIUM CHLORIDE 0.9 % IV SOLN
INTRAVENOUS | Status: AC
  Administered 2013-10-01: 12:00:00 via INTRAVENOUS

## 2013-10-01 MED ORDER — DOCUSATE SODIUM 100 MG PO CAPS
100.0000 mg | ORAL_CAPSULE | Freq: Every day | ORAL | Status: DC | PRN
  Filled 2013-10-01: qty 1

## 2013-10-01 MED ORDER — ONDANSETRON HCL 4 MG PO TABS
4.0000 mg | ORAL_TABLET | Freq: Four times a day (QID) | ORAL | Status: DC | PRN
  Administered 2013-10-03: 4 mg via ORAL
  Filled 2013-10-01: qty 1

## 2013-10-01 MED ORDER — GUAIFENESIN-DM 100-10 MG/5ML PO SYRP
5.0000 mL | ORAL_SOLUTION | ORAL | Status: DC | PRN
Start: 2013-10-01 — End: 2013-10-03
  Administered 2013-10-01 (×2): 5 mL via ORAL
  Filled 2013-10-01 (×2): qty 5

## 2013-10-01 MED ORDER — POTASSIUM CHLORIDE CRYS ER 10 MEQ PO TBCR
10.0000 meq | EXTENDED_RELEASE_TABLET | Freq: Three times a day (TID) | ORAL | Status: DC
Start: 2013-10-01 — End: 2013-10-03
  Administered 2013-10-01 – 2013-10-03 (×6): 10 meq via ORAL
  Filled 2013-10-01 (×11): qty 1

## 2013-10-01 MED ORDER — HYDROCODONE-ACETAMINOPHEN 10-325 MG PO TABS
1.0000 | ORAL_TABLET | Freq: Four times a day (QID) | ORAL | Status: DC | PRN
  Administered 2013-10-01 – 2013-10-03 (×6): 1 via ORAL
  Filled 2013-10-01 (×6): qty 1

## 2013-10-01 MED ORDER — TRAZODONE HCL 50 MG PO TABS
50.0000 mg | ORAL_TABLET | Freq: Every day | ORAL | Status: DC
  Administered 2013-10-01 – 2013-10-02 (×2): 50 mg via ORAL
  Filled 2013-10-01 (×2): qty 1

## 2013-10-01 MED ORDER — VALACYCLOVIR HCL 500 MG PO TABS
500.0000 mg | ORAL_TABLET | Freq: Every day | ORAL | Status: DC | PRN
  Filled 2013-10-01: qty 1

## 2013-10-01 MED ORDER — IOHEXOL 350 MG/ML SOLN
100.0000 mL | Freq: Once | INTRAVENOUS | Status: AC | PRN
  Administered 2013-10-01: 100 mL via INTRAVENOUS

## 2013-10-01 MED ORDER — LEVOFLOXACIN IN D5W 750 MG/150ML IV SOLN
750.0000 mg | INTRAVENOUS | Status: DC
  Administered 2013-10-02 – 2013-10-03 (×2): 750 mg via INTRAVENOUS
  Filled 2013-10-01 (×2): qty 150

## 2013-10-01 MED ORDER — ONDANSETRON HCL 4 MG/2ML IJ SOLN
4.0000 mg | Freq: Four times a day (QID) | INTRAMUSCULAR | Status: DC | PRN
  Administered 2013-10-02 – 2013-10-03 (×3): 4 mg via INTRAVENOUS
  Filled 2013-10-01 (×3): qty 2

## 2013-10-01 MED ORDER — DIFLUPREDNATE 0.05 % OP EMUL: 1.0000 [drp] | Freq: Three times a day (TID) | OPHTHALMIC | Status: DC

## 2013-10-01 MED ORDER — LEVOFLOXACIN IN D5W 750 MG/150ML IV SOLN
750.0000 mg | Freq: Once | INTRAVENOUS | Status: AC
Start: 2013-10-01 — End: 2013-10-01
  Administered 2013-10-01: 750 mg via INTRAVENOUS
  Filled 2013-10-01: qty 150

## 2013-10-01 MED ORDER — DOCUSATE SODIUM 100 MG PO CAPS
100.0000 mg | ORAL_CAPSULE | Freq: Two times a day (BID) | ORAL | Status: DC
  Administered 2013-10-01 – 2013-10-03 (×5): 100 mg via ORAL
  Filled 2013-10-01 (×5): qty 1

## 2013-10-01 NOTE — ED Notes (Signed)
Patient c/o headache. 6/10 on NPS. Medicated per orders.

## 2013-10-01 NOTE — ED Notes (Signed)
Having difficulty, has bloody sputum. Sats were 88% on room air.  BP 120/62, pulse 103.  Sats 96% on 4 liters now.

## 2013-10-01 NOTE — ED Provider Notes (Signed)
CSN: [id number]     Arrival date & time 10/01/13  0701 History   First MD Initiated Contact with Patient 10/01/13 [id number]     Chief Complaint  Patient presents with  . Shortness of Breath     HPI Pt was seen at 0810. Per pt, c/o gradual onset and worsening of persistent cough for the past 3 days. Pt states she has had home temps to "101." Pt states overnight last night "I coughed so much my sputum started to have blood in it." Pt describes this as "blood streaks" in her sputum. Pt has been taking motrin for her fevers. Pt describes her symptoms as "like the last time I had pneumonia." Denies CP/palpitations, no abd pain, no N/V/D, no back pain, no rash, no easy bruising, no epistaxis, no frank hemoptysis.    Past Medical History  Diagnosis Date  . PONV (postoperative nausea and vomiting)   . Hypertension   . Depression   . Seasonal allergies   . Pneumonia   . Anxiety   . GERD (gastroesophageal reflux disease)   . H/O hiatal hernia   . Arthritis   . Anemia    Past Surgical History  Procedure Laterality Date  . Back surgery      lumbar x2 by Dr Arnoldo Morale  . Tonsillectomy    . Appendectomy    . Abdominal hysterectomy    . Cholecystectomy    . Joint replacement Right 1995    hip  . Joint replacement Left 2008    hip  . Medial partial knee replacement Left     Dr Ronnie Derby  . Shoulder arthroscopy Right   . Shoulder arthroscopy Right     x2  . Cardiac catheterization  1990's    "okay"  . Total shoulder arthroplasty Right 02/16/2013    Procedure: TOTAL SHOULDER ARTHROPLASTY;  Surgeon: Marybelle Killings, MD;  Location: [place];  Service: Orthopedics;  Laterality: Right;  Right Total Shoulder Arthroplasty, Cemented  . Cervical fusion    . Cataract extraction w/phaco Left 08/06/2013    Procedure: CATARACT EXTRACTION PHACO AND INTRAOCULAR LENS PLACEMENT LEFT EYE;  Surgeon: Tonny Branch, MD;  Location: [facility];  Service: Ophthalmology;  Laterality: Left;  CDE: 9.55  . Cataract extraction w/phaco  Right 08/24/2013    Procedure: CATARACT EXTRACTION PHACO AND INTRAOCULAR LENS PLACEMENT (IOC);  Surgeon: Tonny Branch, MD;  Location: [facility];  Service: Ophthalmology;  Laterality: Right;  CDE:8.30  . Eye surgery      History  Substance Use Topics  . Smoking status: Never Smoker   . Smokeless tobacco: Never Used  . Alcohol Use: No    Review of Systems ROS: Statement: All systems negative except as marked or noted in the HPI; Constitutional: +fever and chills. ; ; Eyes: Negative for eye pain, redness and discharge. ; ; ENMT: Negative for ear pain, hoarseness, nasal congestion, sinus pressure and sore throat. ; ; Cardiovascular: Negative for chest pain, palpitations, diaphoresis, dyspnea and peripheral edema. ; ; Respiratory: +cough. Negative for wheezing and stridor. ; ; Gastrointestinal: Negative for nausea, vomiting, diarrhea, abdominal pain, blood in stool, hematemesis, jaundice and rectal bleeding. . ; ; Genitourinary: Negative for dysuria, flank pain and hematuria. ; ; Musculoskeletal: Negative for back pain and neck pain. Negative for swelling and trauma.; ; Skin: Negative for pruritus, rash, abrasions, blisters, bruising and skin lesion.; ; Neuro: Negative for headache, lightheadedness and neck stiffness. Negative for weakness, altered level of consciousness , altered mental status, extremity weakness, paresthesias, involuntary  movement, seizure and syncope.      Allergies  Novocain; Other; Latex; Penicillins; and Sulfa antibiotics  Home Medications   Prior to Admission medications   Medication Sig Start Date End Date Taking? Authorizing Provider  Calcium Citrate-Vitamin D (CITRACAL PETITES/VITAMIN D) 200-250 MG-UNIT TABS Take 1 tablet by mouth daily.    Historical Provider, MD  diazepam (VALIUM) 5 MG tablet Take 5 mg by mouth 3 (three) times daily as needed. For anxiety    Historical Provider, MD  docusate sodium (COLACE) 100 MG capsule Take 100 mg by mouth daily as needed for mild  constipation.    Historical Provider, MD  DUREZOL 0.05 % EMUL Apply 1 drop to eye 3 (three) times daily. 07/21/13   Historical Provider, MD  HYDROcodone-acetaminophen (NORCO) 10-325 MG per tablet Take 1 tablet by mouth every 6 (six) hours as needed for moderate pain.    Historical Provider, MD  ibuprofen (ADVIL,MOTRIN) 200 MG tablet Take 200 mg by mouth every 6 (six) hours as needed for moderate pain.    Historical Provider, MD  omeprazole (PRILOSEC) 20 MG capsule Take 20 mg by mouth 2 (two) times daily.    Historical Provider, MD  potassium chloride (K-DUR) 10 MEQ tablet Take 10 mEq by mouth 3 (three) times daily with meals.     Historical Provider, MD  PROLENSA 0.07 % SOLN Apply 1 drop to eye 3 (three) times daily. 07/21/13   Historical Provider, MD  sertraline (ZOLOFT) 100 MG tablet Take 100 mg by mouth daily.    Historical Provider, MD  traZODone (DESYREL) 50 MG tablet Take 50 mg by mouth at bedtime.    Historical Provider, MD  triamterene-hydrochlorothiazide (MAXZIDE) 75-50 MG per tablet Take 1 tablet by mouth daily.    Historical Provider, MD  valACYclovir (VALTREX) 500 MG tablet Take 500 mg by mouth daily as needed (fever blisters).    Historical Provider, MD   BP 132/61  Pulse 101  Temp(Src) 99.8 F (37.7 C) (Rectal)  Resp 24  Ht 5\' 5"  (1.651 m)  Wt 216 lb (97.977 kg)  BMI 35.94 kg/m2  SpO2 95% Physical Exam 0815: Physical examination:  Nursing notes reviewed; Vital signs and O2 SAT reviewed;  Constitutional: Well developed, Well nourished, In no acute distress; Head:  Normocephalic, atraumatic; Eyes: EOMI, PERRL, No scleral icterus; ENMT: Mouth and pharynx normal, Mucous membranes dry. No epistaxis. No blood in post pharynx or oral cavity.; Neck: Supple, Full range of motion, No lymphadenopathy; Cardiovascular: Tachycardic rate and rhythm, No gallop; Respiratory: Breath sounds diminished, coarse & equal bilaterally, No wheezes.  Speaking full sentences, Normal respiratory  effort/excursion; Chest: Nontender, Movement normal; Abdomen: Soft, Nontender, Nondistended, Normal bowel sounds; Genitourinary: No CVA tenderness; Extremities: Pulses normal, No tenderness, No edema, No calf edema or asymmetry.; Neuro: AA&Ox3, Major CN grossly intact.  Speech clear. No gross focal motor or sensory deficits in extremities.; Skin: Color normal, Warm, Dry.   ED Course  Procedures     EKG Interpretation   Date/Time:  Thursday October 01 2013 07:12:45 EDT Ventricular Rate:  101 PR Interval:  135 QRS Duration: 87 QT Interval:  346 QTC Calculation: 448 R Axis:   2 Text Interpretation:  Sinus tachycardia Probable left atrial enlargement  Left axis deviation When compared with ECG of 01/07/2013 No significant  change was found Confirmed by [facility]  MD, Nunzio Cory [phone]) on 10/01/2013  8:20:14 AM      MDM  MDM Reviewed: previous chart, nursing note and vitals Reviewed previous: labs and  ECG Interpretation: labs, ECG and x-ray   Results for orders placed during the hospital encounter of 10/01/13  URINALYSIS, ROUTINE W REFLEX MICROSCOPIC      Result Value Ref Range   Color, Urine YELLOW  YELLOW   APPearance CLEAR  CLEAR   Specific Gravity, Urine 1.015  1.005 - 1.030   pH 5.5  5.0 - 8.0   Glucose, UA NEGATIVE  NEGATIVE mg/dL   Hgb urine dipstick NEGATIVE  NEGATIVE   Bilirubin Urine NEGATIVE  NEGATIVE   Ketones, ur NEGATIVE  NEGATIVE mg/dL   Protein, ur NEGATIVE  NEGATIVE mg/dL   Urobilinogen, UA 0.2  0.0 - 1.0 mg/dL   Nitrite NEGATIVE  NEGATIVE   Leukocytes, UA NEGATIVE  NEGATIVE  CBC WITH DIFFERENTIAL      Result Value Ref Range   WBC 11.1 (*) 4.0 - 10.5 K/uL   RBC 3.86 (*) 3.87 - 5.11 MIL/uL   Hemoglobin 9.5 (*) 12.0 - 15.0 g/dL   HCT 30.8 (*) 36.0 - 46.0 %   MCV 79.8  78.0 - 100.0 fL   MCH 24.6 (*) 26.0 - 34.0 pg   MCHC 30.8  30.0 - 36.0 g/dL   RDW 16.7 (*) 11.5 - 15.5 %   Platelets 198  150 - 400 K/uL   Neutrophils Relative % 88 (*) 43 - 77 %   Neutro Abs  9.7 (*) 1.7 - 7.7 K/uL   Lymphocytes Relative 4 (*) 12 - 46 %   Lymphs Abs 0.5 (*) 0.7 - 4.0 K/uL   Monocytes Relative 7  3 - 12 %   Monocytes Absolute 0.8  0.1 - 1.0 K/uL   Eosinophils Relative 1  0 - 5 %   Eosinophils Absolute 0.1  0.0 - 0.7 K/uL   Basophils Relative 0  0 - 1 %   Basophils Absolute 0.0  0.0 - 0.1 K/uL  BASIC METABOLIC PANEL      Result Value Ref Range   Sodium 141  137 - 147 mEq/L   Potassium 3.8  3.7 - 5.3 mEq/L   Chloride 107  96 - 112 mEq/L   CO2 22  19 - 32 mEq/L   Glucose, Bld 113 (*) 70 - 99 mg/dL   BUN 21  6 - 23 mg/dL   Creatinine, Ser 0.98  0.50 - 1.10 mg/dL   Calcium 8.2 (*) 8.4 - 10.5 mg/dL   GFR calc non Af Amer 56 (*) >90 mL/min   GFR calc Af Amer 65 (*) >90 mL/min   Anion gap 12  5 - 15  TROPONIN I      Result Value Ref Range   Troponin I <0.30  <0.30 ng/mL  LACTIC ACID, PLASMA      Result Value Ref Range   Lactic Acid, Venous 1.3  0.5 - 2.2 mmol/L   Dg Chest 2 View 10/01/2013   CLINICAL DATA:  Shortness of breath.  Fever.  EXAM: CHEST  2 VIEW  COMPARISON:  Chest x-ray 02/01/2013, 01/31/2013, 01/29/2013.  FINDINGS: Mediastinum unremarkable. Right lower lobe consolidation consistent with pneumonia noted. Right lower lobe lobe infiltrate was noted on 11/29 and 02/01/2013. This is most likely a recurrent infiltrate. There is associated mild right hilar fullness. Close follow-up chest x-rays recommended to demonstrate complete clearing to exclude a centrally obstructing lesion. No pleural effusion or pneumothorax. Heart size stable. Prior cervical spine fusion. Right shoulder replacement. Surgical clips upper abdomen.  IMPRESSION: Recurrent pneumonia right lower lobe. Associated right hilar mass versus prominent pulmonary artery  noted. Close follow-up chest x-rays suggested to demonstrate complete clearing.   Electronically Signed   By: Marcello Moores  Register   On: 10/01/2013 08:20    0840:  Short neb given on arrival for low O2 Sats with improvement to 95% on  O2 2L N/C. Lungs continue coarse bilat. Will dose IV levaquin for CAP. H/H per baseline. Dx and testing d/w pt.  Questions answered.  Verb understanding, agreeable to admit. T/C to [facility]. Roderic Palau, case discussed, including:  HPI, pertinent PM/SHx, VS/PE, dx testing, ED course and treatment:  Agreeable to admit, requests to write temporary orders, obtain medical bed to team 2.     Alfonzo Feller, DO 10/03/13 [phone]

## 2013-10-01 NOTE — ED Notes (Signed)
Upon arrival, patient ambulatory to restroom with steady gait.

## 2013-10-01 NOTE — ED Notes (Signed)
Report given to Janace Aris, RN

## 2013-10-01 NOTE — Progress Notes (Signed)
ANTIBIOTIC CONSULT NOTE - INITIAL  Pharmacy Consult for Levaquin Indication: pneumonia  Allergies  Allergen Reactions  . Novocain [Procaine Hcl]     Unknown-passed out with novocaine injected for dental surgery.  . Other Hives    EKG pads - need to use pediatric pads  . Latex Rash  . Penicillins Rash  . Sulfa Antibiotics Rash    Patient Measurements: Height: 5\' 5"  (165.1 cm) Weight: 216 lb (97.977 kg) IBW/kg (Calculated) : 57  Vital Signs: Temp: 99.8 F (37.7 C) (07/30 0736) Temp src: Rectal (07/30 0736) BP: 149/78 mmHg (07/30 0930) Pulse Rate: 105 (07/30 0930) Intake/Output from previous day:   Intake/Output from this shift:    Labs:  Recent Labs  10/01/13 0735  WBC 11.1*  HGB 9.5*  PLT 198  CREATININE 0.98   Estimated Creatinine Clearance: 60.1 ml/min (by C-G formula based on Cr of 0.98). No results found for this basename: VANCOTROUGH, VANCOPEAK, VANCORANDOM, GENTTROUGH, GENTPEAK, GENTRANDOM, TOBRATROUGH, TOBRAPEAK, TOBRARND, AMIKACINPEAK, AMIKACINTROU, AMIKACIN,  in the last 72 hours   Microbiology: No results found for this or any previous visit (from the past 720 hour(s)).  Medical History: Past Medical History  Diagnosis Date  . PONV (postoperative nausea and vomiting)   . Hypertension   . Depression   . Seasonal allergies   . Pneumonia   . Anxiety   . GERD (gastroesophageal reflux disease)   . H/O hiatal hernia   . Arthritis   . Anemia     Medications:  Scheduled:  . albuterol  2.5 mg Nebulization Q4H  . Difluprednate  1 drop Ophthalmic TID  . docusate sodium  100 mg Oral BID  . enoxaparin (LOVENOX) injection  40 mg Subcutaneous Q24H  . ketorolac  1 drop Both Eyes QID  . pantoprazole  40 mg Oral Daily  . potassium chloride  10 mEq Oral TID WC  . sertraline  100 mg Oral Daily  . traZODone  50 mg Oral QHS   Assessment: 73 yo F with recurrent PNA.  She complains of persistent cough with hemoptysis, SOB, and fever.  WBC is slightly  elevated.  Patient has been afebrile since admission.   Renal function is at patient's baseline.   Levaquin 7/30>>  Goal of Therapy:  Eradicate infection.  Plan:  Levaquin 750mg  IV q24h Monitor renal function and cx data  Change to oral therapy once appropriate  Biagio Borg 10/01/2013,11:03 AM

## 2013-10-01 NOTE — ED Notes (Signed)
RN at bedside

## 2013-10-01 NOTE — ED Notes (Signed)
Attempted to return RN call to [phone] for report. RN unavailable at this time.

## 2013-10-01 NOTE — Progress Notes (Signed)
UR chart review completed.  

## 2013-10-01 NOTE — H&P (Signed)
[facility] History and Physical  [name] [name] RKY:[id number] DOB: 1940-11-05 DOA: 10/01/2013  Referring physician:  PCP: [name], MD   Chief Complaint: shortness of breath/cough  HPI: [name] [name] is a very pleasant 73 y.o. female with a past medical history that includes hypertension, pneumonia, GERD, anemia presents to the emergency department with the chief complaint of worsening shortness of breath and persistent cough with hemoptysis. Initial workup reveals a right lower lobe pneumonia and hypoxia on room air.  Patient states that she did not feel "all that good" yesterday. She reports she been coughing for 3 days. She awakened at 2 AM with sudden shortness of breath and worsening cough accompanied with "coughing up blood". She describes sputum as blood-tinged and denies any frank blood during coughing. Associated symptoms include fever of 101, chills, weakness. She reports taking Motrin for her fever. She denies chest pain palpitation headache nausea vomiting diarrhea. She reports having pneumonia about 14 months ago and symptoms were quite similar.   Workup reveals a leukocytosis of 11.1, lactic acid 1.3 hemoglobin 9.5. Chest x-ray as above, urinalysis unremarkable. He is hemodynamically stable but slightly tachycardic with a heart rate of 102,  afebrile and hypoxic with an oxygen saturation level of 88%. She received nebulizer and oxygen saturation level improved to 93% on 2 L of oxygen. She also received gentle IV hydration and one dose of Levaquin intravenously.  Review of Systems:  10 point review of systems completed and all systems are negative except as indicated in the history of present illness  Past Medical History  Diagnosis Date  . PONV (postoperative nausea and vomiting)   . Hypertension   . Depression   . Seasonal allergies   . Pneumonia   . Anxiety   . GERD (gastroesophageal reflux disease)   . H/O hiatal hernia   . Arthritis   . Anemia    Past  Surgical History  Procedure Laterality Date  . Back surgery      lumbar x2 by Dr Arnoldo Morale  . Tonsillectomy    . Appendectomy    . Abdominal hysterectomy    . Cholecystectomy    . Joint replacement Right 1995    hip  . Joint replacement Left 2008    hip  . Medial partial knee replacement Left     Dr Ronnie Derby  . Shoulder arthroscopy Right   . Shoulder arthroscopy Right     x2  . Cardiac catheterization  1990's    "okay"  . Total shoulder arthroplasty Right 02/16/2013    Procedure: TOTAL SHOULDER ARTHROPLASTY;  Surgeon: Marybelle Killings, MD;  Location: [place];  Service: Orthopedics;  Laterality: Right;  Right Total Shoulder Arthroplasty, Cemented  . Cervical fusion    . Cataract extraction w/phaco Left 08/06/2013    Procedure: CATARACT EXTRACTION PHACO AND INTRAOCULAR LENS PLACEMENT LEFT EYE;  Surgeon: Tonny Branch, MD;  Location: [facility];  Service: Ophthalmology;  Laterality: Left;  CDE: 9.55  . Cataract extraction w/phaco Right 08/24/2013    Procedure: CATARACT EXTRACTION PHACO AND INTRAOCULAR LENS PLACEMENT (IOC);  Surgeon: Tonny Branch, MD;  Location: [facility];  Service: Ophthalmology;  Laterality: Right;  CDE:8.30  . Eye surgery     Social History:  reports that she has never smoked. She has never used smokeless tobacco. She reports that she does not drink alcohol or use illicit drugs. 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 Novocain [Procaine Hcl]     Unknown-passed out with novocaine injected for dental surgery.  . Other Hives    EKG pads - need to use pediatric pads  . Latex Rash  . Penicillins Rash  . Sulfa Antibiotics Rash    History reviewed. No pertinent family history.   Prior to Admission medications   Medication Sig Start Date End Date Taking? Authorizing Provider  Calcium Citrate-Vitamin D (CITRACAL PETITES/VITAMIN D) 200-250 MG-UNIT TABS Take 1 tablet by mouth daily.     Historical Provider, MD  diazepam (VALIUM) 5 MG tablet Take 5 mg by mouth 3 (three) times daily as needed. For anxiety    Historical Provider, MD  docusate sodium (COLACE) 100 MG capsule Take 100 mg by mouth daily as needed for mild constipation.    Historical Provider, MD  DUREZOL 0.05 % EMUL Apply 1 drop to eye 3 (three) times daily. 07/21/13   Historical Provider, MD  HYDROcodone-acetaminophen (NORCO) 10-325 MG per tablet Take 1 tablet by mouth every 6 (six) hours as needed for moderate pain.    Historical Provider, MD  ibuprofen (ADVIL,MOTRIN) 200 MG tablet Take 200 mg by mouth every 6 (six) hours as needed for moderate pain.    Historical Provider, MD  omeprazole (PRILOSEC) 20 MG capsule Take 20 mg by mouth 2 (two) times daily.    Historical Provider, MD  potassium chloride (K-DUR) 10 MEQ tablet Take 10 mEq by mouth 3 (three) times daily with meals.     Historical Provider, MD  PROLENSA 0.07 % SOLN Apply 1 drop to eye 3 (three) times daily. 07/21/13   Historical Provider, MD  sertraline (ZOLOFT) 100 MG tablet Take 100 mg by mouth daily.    Historical Provider, MD  traZODone (DESYREL) 50 MG tablet Take 50 mg by mouth at bedtime.    Historical Provider, MD  triamterene-hydrochlorothiazide (MAXZIDE) 75-50 MG per tablet Take 1 tablet by mouth daily.    Historical Provider, MD  valACYclovir (VALTREX) 500 MG tablet Take 500 mg by mouth daily as needed (fever blisters).    Historical Provider, MD   Physical Exam: Filed Vitals:   10/01/13 0730 10/01/13 0736 10/01/13 0800 10/01/13 0808  BP: 134/58  132/61   Pulse: 101     Temp:  99.8 F (37.7 C)    TempSrc:  Rectal    Resp: 22  24   Height:      Weight:      SpO2: 95%   95%    Wt Readings from Last 3 Encounters:  10/01/13 97.977 kg (216 lb)  07/30/13 98.431 kg (217 lb)  02/17/13 93.441 kg (206 lb)    General:  Appears calm and comfortable, somewhat ill appearing Eyes: PERRL, normal lids, irises & conjunctiva ENT: Ears are clear nose  without drainage oropharynx without erythema or exudate. Mucous membranes of her mouth are pink slightly dry Neck: no LAD, masses or thyromegaly Cardiovascular: RRR occasional ectopic beat. No gallop or rub no lower extremity edema pedal pulses are present and palpable Respiratory: Normal effort. Breath sounds are diminished throughout particularly in the right lower lobe. I hear no wheeze no crackles Abdomen: soft, nondistended positive bowel sounds throughout nontender to palpation Skin: no rash or induration seen on limited exam Musculoskeletal: grossly normal tone BUE/BLE moves all extremities ambulates with a steady gait Psychiatric: grossly normal mood and affect, speech fluent and appropriate Neurologic: grossly non-focal. Speech clear facial symmetry cranial nerves II through XII grossly intact  Labs on Admission:  Basic Metabolic Panel:  Recent Labs Lab 10/01/13 0735  NA 141  K 3.8  CL 107  CO2 22  GLUCOSE 113*  BUN 21  CREATININE 0.98  CALCIUM 8.2*   Liver Function Tests: No results found for this basename: AST, ALT, ALKPHOS, BILITOT, PROT, ALBUMIN,  in the last 168 hours No results found for this basename: LIPASE, AMYLASE,  in the last 168 hours No results found for this basename: AMMONIA,  in the last 168 hours CBC:  Recent Labs Lab 10/01/13 0735  WBC 11.1*  NEUTROABS 9.7*  HGB 9.5*  HCT 30.8*  MCV 79.8  PLT 198   Cardiac Enzymes:  Recent Labs Lab 10/01/13 0735  TROPONINI <0.30    BNP (last 3 results) No results found for this basename: PROBNP,  in the last 8760 hours CBG: No results found for this basename: GLUCAP,  in the last 168 hours  Radiological Exams on Admission: Dg Chest 2 View  10/01/2013   CLINICAL DATA:  Shortness of breath.  Fever.  EXAM: CHEST  2 VIEW  COMPARISON:  Chest x-ray 02/01/2013, 01/31/2013, 01/29/2013.  FINDINGS: Mediastinum unremarkable. Right lower lobe consolidation consistent with pneumonia noted. Right lower  lobe lobe infiltrate was noted on 11/29 and 02/01/2013. This is most likely a recurrent infiltrate. There is associated mild right hilar fullness. Close follow-up chest x-rays recommended to demonstrate complete clearing to exclude a centrally obstructing lesion. No pleural effusion or pneumothorax. Heart size stable. Prior cervical spine fusion. Right shoulder replacement. Surgical clips upper abdomen.  IMPRESSION: Recurrent pneumonia right lower lobe. Associated right hilar mass versus prominent pulmonary artery noted. Close follow-up chest x-rays suggested to demonstrate complete clearing.   Electronically Signed   By: Marcello Moores  Register   On: 10/01/2013 08:20    EKG: Independently reviewed. ST  Assessment/Plan Principal Problem:   Hypoxia: Likely related to recurrent pneumonia. Patient was given nebulizer in the emergency department oxygen saturation levels improved to greater than 90% on 2 L. Will continue oxygen supplementation and monitor oxygen saturation level closely. Will continue nebulizers. Will also obtain CT angiogram chest for further evaluation.  Active Problems:   CAP (community acquired pneumonia): Patient with history of same about 14 months ago. Will continue the Levaquin that was started in the emergency department. Will obtain sputum culture as able. Request strep pneumo urine antigen as well as Legionella urine antigen. Will get blood cultures if she spikes a temperature. Supportive therapy.    Cough with hemoptysis: Etiology uncertain. Chest x-ray also with right hilar mass versus prominent pulmonary artery noted. Will obtain CT angiogram chest for further evaluation    Hypertension: Controlled in the emergency department. Home medication list includes Maxzide the patient indicates she has not taken that since November of 2014 her PCP instruction. Will monitor blood pressure.    GERD (gastroesophageal reflux disease): Stable at baseline. Continue home medications    Anxiety:  Appears stable at baseline. Home medications include Valium that she says she takes once or twice a week. Continue this on an as-needed basis    Anemia: Chart review indicates baseline hemoglobin 11-13. Currently 9.5. Will obtain an anemia panel. Also fecal occult blood test.    Leukocytosis: Likely related to #2. Antibiotics as above. Monitor    Code Status: full DVT Prophylaxis: Family Communication: niece by phone Disposition Plan: home when ready  Time spent: 71 minutes  [facility] Pager [phone]  **Disclaimer: This note may have been dictated with voice  recognition software. Similar sounding words can inadvertently be transcribed and this note may contain transcription errors which may not have been corrected upon publication of note.**

## 2013-10-01 NOTE — H&P (Signed)
The patient seen and examined. Note reviewed.  She's been admitted with generalized weakness, cough, hemoptysis and fever. Chest x-ray as well as CT scan of the chest indicate underlying pneumonia. She's been started on appropriate antibiotics. She was noted to be hypoxic in the emergency room required supplemental oxygen. We'll continue current treatments.  [name] [name]

## 2013-10-01 NOTE — ED Notes (Signed)
Patient informed of bed assignment. Waiting for admitting RN to call for report.

## 2013-10-01 NOTE — ED Notes (Signed)
Ambulatory to restroom. Steady gait.

## 2013-10-01 NOTE — Progress Notes (Addendum)
Attempted to call to get report, RN states she will call back

## 2013-10-02 ENCOUNTER — Encounter (HOSPITAL_COMMUNITY): Payer: Self-pay | Admitting: Internal Medicine

## 2013-10-02 DIAGNOSIS — A419 Sepsis, unspecified organism: Principal | ICD-10-CM

## 2013-10-02 DIAGNOSIS — Z6838 Body mass index (BMI) 38.0-38.9, adult: Secondary | ICD-10-CM

## 2013-10-02 DIAGNOSIS — D649 Anemia, unspecified: Secondary | ICD-10-CM

## 2013-10-02 HISTORY — DX: Sepsis, unspecified organism: A41.9

## 2013-10-02 LAB — CBC
HCT: 28.3 % — ABNORMAL LOW (ref 36.0–46.0)
Hemoglobin: 8.7 g/dL — ABNORMAL LOW (ref 12.0–15.0)
MCH: 24.6 pg — ABNORMAL LOW (ref 26.0–34.0)
MCHC: 30.7 g/dL (ref 30.0–36.0)
MCV: 80.2 fL (ref 78.0–100.0)
Platelets: 176 10*3/uL (ref 150–400)
RBC: 3.53 MIL/uL — ABNORMAL LOW (ref 3.87–5.11)
RDW: 17 % — ABNORMAL HIGH (ref 11.5–15.5)
WBC: 11.7 10*3/uL — ABNORMAL HIGH (ref 4.0–10.5)

## 2013-10-02 LAB — FOLATE: Folate: 4.7 ng/mL

## 2013-10-02 LAB — URINE CULTURE: Colony Count: 50000

## 2013-10-02 LAB — FERRITIN: Ferritin: 10 ng/mL (ref 10–291)

## 2013-10-02 LAB — VITAMIN B12: Vitamin B-12: 538 pg/mL (ref 211–911)

## 2013-10-02 NOTE — Progress Notes (Signed)
[facility] PROGRESS NOTE  [name] [name] GEZ:[id number] DOB: 04-Sep-1940 DOA: 10/01/2013 PCP: [name], MD  Assessment/Plan: Principal Problem:  Acute respiratory failure with Hypoxia: Improving.CT angiogram chest negative for PE/mass. Resolved this am.  Oxygen saturation level 99% on 2 L. Will wean trial. Continue nebulizers.   Active Problems:  CAP (community acquired pneumonia): she is afebrile but white count still 11. Will continue the Levaquin day #2. Await obtain sputum culture as able. await strep pneumo urine antigen as well as Legionella urine antigen. Will mobilize. Start incentive spirometry.    Cough with hemoptysis: likely related to pneumonia. Improving.  CT angiogram chest negative for PE/mass.    Hypertension: Controlled.  Home medication list includes Maxzide the patient indicates she has not taken that since November of 2014 per her PCP instruction.   GERD (gastroesophageal reflux disease): remains stable at baseline. Continue home medications   Anxiety: Remains stable at baseline. Home medications include Valium that she says she takes once or twice a week. Continue this on an as-needed basis   Anemia: Chart review indicates baseline hemoglobin 11-13. 8.7. Anemia panel consistent with IDA. Marland KitchenAlso fecal occult blood test. Will monitor closely. Not currently symptomatic. Will need OP follow up.   Leukocytosis: Likely related to #2. Antibiotics as above. Monitor    Code Status: full Family Communication: none Disposition Plan: home hopefully tomorrow   Consultants:  none  Procedures:  none  Antibiotics:  Levaquin 10/01/13>>  HPI/Subjective: Awake alert. Reports not feeling much better.   Objective: Filed Vitals:   10/02/13 0657  BP: 133/73  Pulse: 71  Temp: 97.6 F (36.4 C)  Resp: 18    Intake/Output Summary (Last 24 hours) at 10/02/13 0909 Last data filed at 10/02/13 0701  Gross per 24 hour  Intake  902.5 ml  Output   1900 ml  Net  -997.5 ml   Filed Weights   10/01/13 0705 10/01/13 1204  Weight: 97.977 kg (216 lb) 100.699 kg (222 lb)    Exam:   General:  Well nourished appears comfortable  Cardiovascular: RRR No gallup or rub no LE edema PPP  Respiratory: normal effort BS somewhat distant particularly left upper quadrant i hear no crackles no wheeze  Abdomen: obese soft +BS non-tender to palpation  Musculoskeletal: no clubbing no cyanosis   Data Reviewed: Basic Metabolic Panel:  Recent Labs Lab 10/01/13 0735  NA 141  K 3.8  CL 107  CO2 22  GLUCOSE 113*  BUN 21  CREATININE 0.98  CALCIUM 8.2*   Liver Function Tests: No results found for this basename: AST, ALT, ALKPHOS, BILITOT, PROT, ALBUMIN,  in the last 168 hours No results found for this basename: LIPASE, AMYLASE,  in the last 168 hours No results found for this basename: AMMONIA,  in the last 168 hours CBC:  Recent Labs Lab 10/01/13 0735 10/02/13 0623  WBC 11.1* 11.7*  NEUTROABS 9.7*  --   HGB 9.5* 8.7*  HCT 30.8* 28.3*  MCV 79.8 80.2  PLT 198 176   Cardiac Enzymes:  Recent Labs Lab 10/01/13 0735  TROPONINI <0.30   BNP (last 3 results) No results found for this basename: PROBNP,  in the last 8760 hours CBG: No results found for this basename: GLUCAP,  in the last 168 hours  No results found for this or any previous visit (from the past 240 hour(s)).   Studies: Dg Chest 2 View  10/01/2013   CLINICAL DATA:  Shortness of breath.  Fever.  EXAM: CHEST  2 VIEW  COMPARISON:  Chest x-ray 02/01/2013, 01/31/2013, 01/29/2013.  FINDINGS: Mediastinum unremarkable. Right lower lobe consolidation consistent with pneumonia noted. Right lower lobe lobe infiltrate was noted on 11/29 and 02/01/2013. This is most likely a recurrent infiltrate. There is associated mild right hilar fullness. Close follow-up chest x-rays recommended to demonstrate complete clearing to exclude a centrally obstructing lesion. No pleural effusion or pneumothorax.  Heart size stable. Prior cervical spine fusion. Right shoulder replacement. Surgical clips upper abdomen.  IMPRESSION: Recurrent pneumonia right lower lobe. Associated right hilar mass versus prominent pulmonary artery noted. Close follow-up chest x-rays suggested to demonstrate complete clearing.   Electronically Signed   By: Marcello Moores  Register   On: 10/01/2013 08:20   Ct Angio Chest Pe W/cm &/or Wo Cm  10/01/2013   CLINICAL DATA:  Shortness of breath and hemoptysis  EXAM: CT ANGIOGRAPHY CHEST WITH CONTRAST  TECHNIQUE: Multidetector CT imaging of the chest was performed using the standard protocol during bolus administration of intravenous contrast. Multiplanar CT image reconstructions and MIPs were obtained to evaluate the vascular anatomy.  CONTRAST:  168mL OMNIPAQUE IOHEXOL 350 MG/ML SOLN  COMPARISON:  PA and lateral chest x-ray of today's date  FINDINGS: There are confluent alveolar densities throughout the right lung. There is minimal similar density in the left upper lobe inferiorly. Contrast within the pulmonary arterial tree is grossly normal. The study is limited by the patient's body habitus as well as respiratory motion. The caliber of the thoracic aorta is normal. There is no false lumen. The cardiac chambers are top-normal in size. There is no pericardial effusion. There is a trace of pleural fluid in the posterior costophrenic gutters. There is no mediastinal or hilar lymphadenopathy. There is a hypodense nodule in the right thyroid lobe.  There are surgical clips at the level of the GE junction. The spleen is top-normal in size but stable where visualized. The observed portions of the liver and adrenal glands are unremarkable. There is degenerative disc change of the thoracic spine at multiple levels. There is gentle dextro curvature of the mid thoracic spine.  Review of the MIP images confirms the above findings.  IMPRESSION: 1. Within the limitations of the study no acute pulmonary embolism nor  acute thoracic aortic pathology is demonstrated. 2. There is diffuse pneumonia on the right with minimal atelectasis or early pneumonia in the left upper lobe inferiorly.   Electronically Signed   By: David  Martinique   On: 10/01/2013 10:03    Scheduled Meds: . docusate sodium  100 mg Oral BID  . enoxaparin (LOVENOX) injection  40 mg Subcutaneous Q24H  . ketorolac  1 drop Both Eyes QID  . levofloxacin (LEVAQUIN) IV  750 mg Intravenous Q24H  . pantoprazole  40 mg Oral Daily  . potassium chloride  10 mEq Oral TID WC  . sertraline  100 mg Oral Daily  . traZODone  50 mg Oral QHS   Continuous Infusions:   Principal Problem:   Hypoxia Active Problems:   CAP (community acquired pneumonia)   Cough with hemoptysis   Hypertension   GERD (gastroesophageal reflux disease)   Anxiety   Anemia   Leukocytosis   Abnormal chest x-ray   Acute respiratory failure with hypoxia   Morbid obesity    Time spent: 35 minutes    [facility] Pager [phone]. If 7PM-7AM, please contact night-coverage at [email or web address], password [facility] 10/02/2013, 9:09 AM  LOS: 1 day   Attending note:  Patient seen and examined.  Above note reviewed.  Patient is starting to feel better today. She is coughing. She feels less short of breath. She continues to have a mild leukocytosis. We'll continue his current antibiotics. We'll try to ambulate the patient today. Anticipate discharge in the next 24 hours.  Diahn Waidelich

## 2013-10-02 NOTE — Care Management Note (Signed)
    Page 1 of 1   10/02/2013     1:58:23 PM CARE MANAGEMENT NOTE 10/02/2013  Patient:  [name] [name], [name] [name]   Account Number:  [id number]  Date Initiated:  10/02/2013  Documentation initiated by:  CHILDRESS,JESSICA  Subjective/Objective Assessment:   Patient admited with Pneumonia. Patient from home, lives alone but has family support near by. Patient has BSC, cane and walker that she uses as needed. Patients PCP is Dr. Nadara Mustard.     Action/Plan:   Patient plans to return home with self care upon D/C. No CM needs noted.   Anticipated DC Date:     Anticipated DC Plan:  HOME/SELF CARE      DC Planning Services  CM consult      Choice offered to / List presented to:             Status of service:   Medicare Important Message given?  YES (If response is "NO", the following Medicare IM given date fields will be blank) Date Medicare IM given:  10/02/2013 Medicare IM given by:  Jolene Provost Date Additional Medicare IM given:   Additional Medicare IM given by:    Discharge Disposition:  HOME/SELF CARE  Per UR Regulation:    If discussed at Long Length of Stay Meetings, dates discussed:    Comments:  10/02/2013 Jolene Provost, RN, MSN, [facility]

## 2013-10-02 NOTE — Clinical Documentation Improvement (Signed)
Possible Clinical Conditions?  Sepsis Severe Sepsis Septic Shock Sepsis due to PNA Other Condition  Cannot clinically Determine   Supporting Information:(As per H&P) "CAP" "Associated symptoms include = 100.82F or = 38C "fever of 101, chills, weakness.She reports taking Motrin for her = 100.82F or = 38C "Workup reveals a leukocytosis of 11.1, lactic acid 1.3 hemoglobin 9.5." \ "tachycardic with a heart rate of 102, afebrile and hypoxic with an oxygen saturation level of 88%.She received nebulizer and oxygen saturation level improved to 93% on 2 L of oxygen"  Thank You, Alessandra Grout, RN, BSN, CCDS,Clinical Documentation Specialist:  [phone]  [phone]=[employer] [place]- Health Information Management

## 2013-10-03 LAB — CBC
HCT: 31.6 % — ABNORMAL LOW (ref 36.0–46.0)
Hemoglobin: 9.7 g/dL — ABNORMAL LOW (ref 12.0–15.0)
MCH: 24.7 pg — ABNORMAL LOW (ref 26.0–34.0)
MCHC: 30.7 g/dL (ref 30.0–36.0)
MCV: 80.6 fL (ref 78.0–100.0)
Platelets: 226 10*3/uL (ref 150–400)
RBC: 3.92 MIL/uL (ref 3.87–5.11)
RDW: 16.8 % — ABNORMAL HIGH (ref 11.5–15.5)
WBC: 7.3 10*3/uL (ref 4.0–10.5)

## 2013-10-03 MED ORDER — ONDANSETRON HCL 4 MG PO TABS: 4.0000 mg | ORAL_TABLET | Freq: Four times a day (QID) | ORAL | Status: DC | PRN

## 2013-10-03 MED ORDER — PANTOPRAZOLE SODIUM 40 MG PO TBEC: 40.0000 mg | DELAYED_RELEASE_TABLET | Freq: Every day | ORAL | Status: DC

## 2013-10-03 MED ORDER — LEVOFLOXACIN 750 MG PO TABS: 750.0000 mg | ORAL_TABLET | Freq: Every day | ORAL | Status: DC

## 2013-10-03 MED ORDER — POLYSACCHARIDE IRON COMPLEX 150 MG PO CAPS: 150.0000 mg | ORAL_CAPSULE | Freq: Two times a day (BID) | ORAL | Status: DC

## 2013-10-03 NOTE — Discharge Instructions (Signed)

## 2013-10-03 NOTE — Progress Notes (Signed)
Patient c/o of nausea this morning.  Zofran PRN administered.  Administered morning medications.  Tolerated well with no emesis.  Patient creamed potatoes and broccoli for lunch.  Tolerated well.  Patient states that she has episodes of nausea since she had her two stomach surgeries.  Patient ambulated around unit twice.   Tolerated well.

## 2013-10-03 NOTE — Progress Notes (Signed)
Patient discharged with instructions, prescription, and care notes.  Verbalized understanding via teach back.  IV was removed and the site was WNL. Patient voiced no further complaints or concerns at the time of discharge.  Appointments scheduled per instructions.  Patient left the floor via w/c with staff and family in stable condition. 

## 2013-10-03 NOTE — Discharge Summary (Signed)
Physician Discharge Summary  [name] [name] OXB:[id number] DOB: 19-Feb-1941 DOA: 10/01/2013  PCP: [name], MD  Admit date: 10/01/2013 Discharge date: 10/03/2013  Time spent: 40 minutes  Recommendations for Outpatient Follow-up:  1. Followup with primary care physician in one to 2 weeks  Discharge Diagnoses:  Principal Problem:   Hypoxia Active Problems:   CAP (community acquired pneumonia)   Cough with hemoptysis   Hypertension   GERD (gastroesophageal reflux disease)   Anxiety   Anemia   Leukocytosis   Abnormal chest x-ray   Acute respiratory failure with hypoxia   Morbid obesity   Sepsis   Discharge Condition: Improved  Diet recommendation: Low-salt  Filed Weights   10/01/13 0705 10/01/13 1204  Weight: 97.977 kg (216 lb) 100.699 kg (222 lb)    History of present illness and hospital course:  This patient was admitted with generalized weakness, cough and fever. She is found to have left-sided pneumonia. She was started on Levaquin for antibiotic coverage. Her leukocytosis gradually resolved. She was weaned off of oxygen and is breathing comfortably on room air. From a pneumonia standpoint, she's been transitioned to oral antibiotics and discharged home. She did describe some nausea and mild vomiting during her hospital stay, but also reported that these were chronic issue since she had her stomach stapled. She was given some Zofran to take at home and to advance her diet slowly. Patient has been ambulating in the halls and is ready for discharge home.  Procedures:    Consultations:    Discharge Exam: Filed Vitals:   10/03/13 0553  BP: 144/65  Pulse: 81  Temp: 98.5 F (36.9 C)  Resp: 18    General: No acute distress Cardiovascular: S1, S2, regular in rhythm Respiratory: Clear to auscultation bilaterally  Discharge Instructions You were cared for by a hospitalist during your hospital stay. If you have any questions about your discharge medications or the  care you received while you were in the hospital after you are discharged, you can call the unit and asked to speak with the hospitalist on call if the hospitalist that took care of you is not available. Once you are discharged, your primary care physician will handle any further medical issues. Please note that NO REFILLS for any discharge medications will be authorized once you are discharged, as it is imperative that you return to your primary care physician (or establish a relationship with a primary care physician if you do not have one) for your aftercare needs so that they can reassess your need for medications and monitor your lab values.  Discharge Instructions   Call MD for:  difficulty breathing, headache or visual disturbances    Complete by:  As directed      Call MD for:  persistant nausea and vomiting    Complete by:  As directed      Call MD for:  temperature >100.4    Complete by:  As directed      Diet - low sodium heart healthy    Complete by:  As directed      Increase activity slowly    Complete by:  As directed             Medication List    STOP taking these medications       ibuprofen 200 MG tablet  Commonly known as:  ADVIL,MOTRIN      TAKE these medications       CITRACAL PETITES/VITAMIN D 200-250 MG-UNIT Tabs  Generic drug:  Calcium Citrate-Vitamin D  Take 1 tablet by mouth daily.     diazepam 5 MG tablet  Commonly known as:  VALIUM  Take 5 mg by mouth 3 (three) times daily as needed. For anxiety     diphenhydrAMINE 25 MG tablet  Commonly known as:  BENADRYL  Take 25 mg by mouth every 6 (six) hours as needed for allergies.     docusate sodium 100 MG capsule  Commonly known as:  COLACE  Take 100 mg by mouth at bedtime.     HYDROcodone-acetaminophen 10-325 MG per tablet  Commonly known as:  NORCO  Take 1 tablet by mouth every 6 (six) hours as needed for moderate pain.     iron polysaccharides 150 MG capsule  Commonly known as:  NIFEREX  Take 1  capsule (150 mg total) by mouth 2 (two) times daily.     levofloxacin 750 MG tablet  Commonly known as:  LEVAQUIN  Take 1 tablet (750 mg total) by mouth daily.     omeprazole 20 MG capsule  Commonly known as:  PRILOSEC  Take 20 mg by mouth 2 (two) times daily.     ondansetron 4 MG tablet  Commonly known as:  ZOFRAN  Take 1 tablet (4 mg total) by mouth every 6 (six) hours as needed for nausea.     pantoprazole 40 MG tablet  Commonly known as:  PROTONIX  Take 1 tablet (40 mg total) by mouth daily.     potassium chloride 10 MEQ tablet  Commonly known as:  K-DUR  Take 10 mEq by mouth 3 (three) times daily with meals.     sertraline 100 MG tablet  Commonly known as:  ZOLOFT  Take 100 mg by mouth daily.     traZODone 50 MG tablet  Commonly known as:  DESYREL  Take 50 mg by mouth at bedtime.       Allergies  Allergen Reactions  . Novocain [Procaine Hcl]     Unknown-passed out with novocaine injected for dental surgery.  . Other Hives    EKG pads - need to use pediatric pads  . Latex Rash  . Penicillins Rash  . Sulfa Antibiotics Rash       Follow-up Information   Follow up with [name], MD. Schedule an appointment as soon as possible for a visit in 2 weeks.   Specialty:  Family Medicine   Contact information:   [address] [address] [phone]        The results of significant diagnostics from this hospitalization (including imaging, microbiology, ancillary and laboratory) are listed below for reference.    Significant Diagnostic Studies: Dg Chest 2 View  10/01/2013   CLINICAL DATA:  Shortness of breath.  Fever.  EXAM: CHEST  2 VIEW  COMPARISON:  Chest x-ray 02/01/2013, 01/31/2013, 01/29/2013.  FINDINGS: Mediastinum unremarkable. Right lower lobe consolidation consistent with pneumonia noted. Right lower lobe lobe infiltrate was noted on 11/29 and 02/01/2013. This is most likely a recurrent infiltrate. There is associated mild right hilar fullness.  Close follow-up chest x-rays recommended to demonstrate complete clearing to exclude a centrally obstructing lesion. No pleural effusion or pneumothorax. Heart size stable. Prior cervical spine fusion. Right shoulder replacement. Surgical clips upper abdomen.  IMPRESSION: Recurrent pneumonia right lower lobe. Associated right hilar mass versus prominent pulmonary artery noted. Close follow-up chest x-rays suggested to demonstrate complete clearing.   Electronically Signed   By: Marcello Moores  Register   On: 10/01/2013 08:20  Ct Angio Chest Pe W/cm &/or Wo Cm  10/01/2013   CLINICAL DATA:  Shortness of breath and hemoptysis  EXAM: CT ANGIOGRAPHY CHEST WITH CONTRAST  TECHNIQUE: Multidetector CT imaging of the chest was performed using the standard protocol during bolus administration of intravenous contrast. Multiplanar CT image reconstructions and MIPs were obtained to evaluate the vascular anatomy.  CONTRAST:  157mL OMNIPAQUE IOHEXOL 350 MG/ML SOLN  COMPARISON:  PA and lateral chest x-ray of today's date  FINDINGS: There are confluent alveolar densities throughout the right lung. There is minimal similar density in the left upper lobe inferiorly. Contrast within the pulmonary arterial tree is grossly normal. The study is limited by the patient's body habitus as well as respiratory motion. The caliber of the thoracic aorta is normal. There is no false lumen. The cardiac chambers are top-normal in size. There is no pericardial effusion. There is a trace of pleural fluid in the posterior costophrenic gutters. There is no mediastinal or hilar lymphadenopathy. There is a hypodense nodule in the right thyroid lobe.  There are surgical clips at the level of the GE junction. The spleen is top-normal in size but stable where visualized. The observed portions of the liver and adrenal glands are unremarkable. There is degenerative disc change of the thoracic spine at multiple levels. There is gentle dextro curvature of the mid  thoracic spine.  Review of the MIP images confirms the above findings.  IMPRESSION: 1. Within the limitations of the study no acute pulmonary embolism nor acute thoracic aortic pathology is demonstrated. 2. There is diffuse pneumonia on the right with minimal atelectasis or early pneumonia in the left upper lobe inferiorly.   Electronically Signed   By: David  Martinique   On: 10/01/2013 10:03    Microbiology: Recent Results (from the past 240 hour(s))  URINE CULTURE     Status: None   Collection Time    10/01/13  7:36 AM      Result Value Ref Range Status   Specimen Description URINE, CLEAN CATCH   Final   Special Requests NONE   Final   Culture  Setup Time     Final   Value: 10/01/2013 19:43     Performed at [facility]     Final   Value: 50,000 COLONIES/ML     Performed at [facility]   Culture     Final   Value: Multiple bacterial morphotypes present, none predominant. Suggest appropriate recollection if clinically indicated.     Performed at [facility]   Report Status 10/02/2013 FINAL   Final     Labs: Basic Metabolic Panel:  Recent Labs Lab 10/01/13 0735  NA 141  K 3.8  CL 107  CO2 22  GLUCOSE 113*  BUN 21  CREATININE 0.98  CALCIUM 8.2*   Liver Function Tests: No results found for this basename: AST, ALT, ALKPHOS, BILITOT, PROT, ALBUMIN,  in the last 168 hours No results found for this basename: LIPASE, AMYLASE,  in the last 168 hours No results found for this basename: AMMONIA,  in the last 168 hours CBC:  Recent Labs Lab 10/01/13 0735 10/02/13 0623 10/03/13 0618  WBC 11.1* 11.7* 7.3  NEUTROABS 9.7*  --   --   HGB 9.5* 8.7* 9.7*  HCT 30.8* 28.3* 31.6*  MCV 79.8 80.2 80.6  PLT 198 176 226   Cardiac Enzymes:  Recent Labs Lab 10/01/13 0735  TROPONINI <0.30   BNP: BNP (last 3  results) No results found for this basename: PROBNP,  in the last 8760 hours CBG: No results found for this basename: GLUCAP,  in the  last 168 hours     Signed:  Yvette Roark  [facility] 10/03/2013, 8:21 PM

## 2013-10-09 DIAGNOSIS — G5600 Carpal tunnel syndrome, unspecified upper limb: Secondary | ICD-10-CM | POA: Diagnosis not present

## 2013-10-09 DIAGNOSIS — M542 Cervicalgia: Secondary | ICD-10-CM | POA: Diagnosis not present

## 2013-10-09 DIAGNOSIS — M4802 Spinal stenosis, cervical region: Secondary | ICD-10-CM | POA: Diagnosis not present

## 2013-10-21 DIAGNOSIS — M47817 Spondylosis without myelopathy or radiculopathy, lumbosacral region: Secondary | ICD-10-CM | POA: Diagnosis not present

## 2013-10-26 ENCOUNTER — Telehealth: Payer: Self-pay | Admitting: Gastroenterology

## 2013-10-26 DIAGNOSIS — I10 Essential (primary) hypertension: Secondary | ICD-10-CM | POA: Diagnosis not present

## 2013-10-26 DIAGNOSIS — N189 Chronic kidney disease, unspecified: Secondary | ICD-10-CM | POA: Diagnosis not present

## 2013-10-26 DIAGNOSIS — F329 Major depressive disorder, single episode, unspecified: Secondary | ICD-10-CM | POA: Diagnosis not present

## 2013-10-26 DIAGNOSIS — K219 Gastro-esophageal reflux disease without esophagitis: Secondary | ICD-10-CM | POA: Diagnosis not present

## 2013-10-26 DIAGNOSIS — M129 Arthropathy, unspecified: Secondary | ICD-10-CM | POA: Diagnosis not present

## 2013-10-26 DIAGNOSIS — F3289 Other specified depressive episodes: Secondary | ICD-10-CM | POA: Diagnosis not present

## 2013-10-26 NOTE — Telephone Encounter (Signed)
PT CALLED TO REQUEST AN APPT. OPV THUR AUG 27 AT [place].

## 2013-10-27 NOTE — Telephone Encounter (Signed)
Appointment made and pt is aware.

## 2013-10-29 ENCOUNTER — Other Ambulatory Visit: Payer: Self-pay | Admitting: Gastroenterology

## 2013-10-29 ENCOUNTER — Ambulatory Visit (INDEPENDENT_AMBULATORY_CARE_PROVIDER_SITE_OTHER): Payer: Medicare Other | Admitting: Gastroenterology

## 2013-10-29 ENCOUNTER — Encounter: Payer: Self-pay | Admitting: Gastroenterology

## 2013-10-29 VITALS — BP 135/78 | HR 82 | Temp 98.6°F | Ht 65.0 in | Wt 221.0 lb

## 2013-10-29 DIAGNOSIS — R1319 Other dysphagia: Secondary | ICD-10-CM

## 2013-10-29 DIAGNOSIS — D509 Iron deficiency anemia, unspecified: Secondary | ICD-10-CM

## 2013-10-29 DIAGNOSIS — N842 Polyp of vagina: Secondary | ICD-10-CM

## 2013-10-29 DIAGNOSIS — D638 Anemia in other chronic diseases classified elsewhere: Secondary | ICD-10-CM | POA: Diagnosis not present

## 2013-10-29 DIAGNOSIS — R1310 Dysphagia, unspecified: Secondary | ICD-10-CM

## 2013-10-29 DIAGNOSIS — K219 Gastro-esophageal reflux disease without esophagitis: Secondary | ICD-10-CM

## 2013-10-29 NOTE — Assessment & Plan Note (Signed)
TO SOLIDS.  EGD/DIL WITHIN NEXT 2 WEEKS SOFT DIET [facility] IN 4 MOS

## 2013-10-29 NOTE — Assessment & Plan Note (Addendum)
NO BRBPR OR MELENA. NORMOCYTIC-MOST LIKELY DUE TO CHRONIC RENAL INSUFFICIENCY, LESS LIKELY COLON CA OR POLYPS.  EGD/DIL/TCS WITH MOVIPREP NEEDS PHENERGAN 12.5 MG IV IN PREOP NEEDS FENTANYL GFR < 40 OPV TBS SCHEDULED AFTER ENDOSCOPY.

## 2013-10-29 NOTE — Patient Instructions (Signed)
UPPER ENDOSCOPY TO STRETCH YOUR ESOPHAGUS WITHIN NEXT 2 WEEKS.   COLONOSCOPY WITHIN THE NEXT 2 WEEKS. HOLD IRON 7 DAYS PRIOR TO COLONOSCOPY.  FOLLOW A SOFT MECHANICAL DIET.  MEATS SHOULD BE CHOPPED OR GROUND ONLY. DO NOT EAT CHUNKS OF ANYTHING. SEE INFO BELOW.  FOLLOW UP IN 4 MOS.   SOFT MECHANICAL DIET This SOFT MECHANICAL DIET is restricted to:  Foods that are moist, soft-textured, and easy to chew and swallow.   Meats that are ground or are minced no larger than one-quarter inch pieces. Meats are moist with gravy or sauce added.   Foods that do not include bread or bread-like textures except soft pancakes, well-moistened with syrup or sauce.   Textures with some chewing ability required.   Casseroles without rice.   Cooked vegetables that are less than half an inch in size and easily mashed with a fork. No cooked corn, peas, broccoli, cauliflower, cabbage, Brussels sprouts, asparagus, or other fibrous, non-tender or rubbery cooked vegetables.   Canned fruit except for pineapple. Fruit must be cut into pieces no larger than half an inch in size.   Foods that do not include nuts, seeds, coconut, or sticky textures.   FOOD TEXTURES FOR DYSPHAGIA DIET LEVEL 2 -SOFT MECHANICAL DIET (includes all foods on Dysphagia Diet Level 1 - Pureed, in addition to the foods listed below)  FOOD GROUP: Breads. RECOMMENDED: Soft pancakes, well-moistened with syrup or sauce.  AVOID: All others.  FOOD GROUP: Cereals.  RECOMMENDED: Cooked cereals with little texture, including oatmeal. Unprocessed wheat bran stirred into cereals for bulk. Note: If thin liquids are restricted, it is important that all of the liquid is absorbed into the cereal.  AVOID: All dry cereals and any cooked cereals that may contain flax seeds or other seeds or nuts. Whole-grain, dry, or coarse cereals. Cereals with nuts, seeds, dried fruit, and/or coconut.  FOOD GROUP: Desserts. RECOMMENDED: Pudding, custard. Soft fruit  pies with bottom crust only. Canned fruit (excluding pineapple). Soft, moist cakes with icing.Frozen malts, milk shakes, frozen yogurt, eggnog, nutritional supplements, ice cream, sherbet, regular or sugar-free gelatin, or any foods that become thin liquid at either room (70 F) or body temperature (98 F).  AVOID: Dry, coarse cakes and cookies. Anything with nuts, seeds, coconut, pineapple, or dried fruit. Breakfast yogurt with nuts. Rice or bread pudding.  FOOD GROUP: Fats. RECOMMENDED: Butter, margarine, cream for cereal (depending on liquid consistency recommendations), gravy, cream sauces, sour cream, sour cream dips with soft additives, mayonnaise, salad dressings, cream cheese, cream cheese spreads with soft additives, whipped toppings.  AVOID: All fats with coarse or chunky additives.  FOOD GROUP: Fruits. RECOMMENDED: Soft drained, canned, or cooked fruits without seeds or skin. Fresh soft and ripe banana. Fruit juices with a small amount of pulp. If thin liquids are restricted, fruit juices should be thickened to appropriate consistency.  AVOID: Fresh or frozen fruits. Cooked fruit with skin or seeds. Dried fruits. Fresh, canned, or cooked pineapple.  FOOD GROUP: Meats and Meat Substitutes. (Meat pieces should not exceed 1/4 of an inch cube and should be tender.) RECOMMENDED: Moistened ground or cooked meat, poultry, or fish. Moist ground or tender meat may be served with gravy or sauce. Casseroles without rice. Moist macaroni and cheese, well-cooked pasta with meat sauce, tuna noodle casserole, soft, moist lasagna. Moist meatballs, meatloaf, or fish loaf. Protein salads, such as tuna or egg without large chunks, celery, or onion. Cottage cheese, smooth quiche without large chunks. Poached, scrambled, or soft-cooked eggs (  egg yolks should not be "runny" but should be moist and able to be mashed with butter, margarine, or other moisture added to them). (Cook eggs to 160 F or use  pasteurized eggs for safety.) Souffls may have small, soft chunks. Tofu. Well-cooked, slightly mashed, moist legumes, such as baked beans. All meats or protein substitutes should be served with sauces or moistened to help maintain cohesiveness in the oral cavity.  AVOID: Dry meats, tough meats (such as bacon, sausage, hot dogs, bratwurst). Dry casseroles or casseroles with rice or large chunks. Peanut butter. Cheese slices and cubes. Hard-cooked or crisp fried eggs. Sandwiches.Pizza.  FOOD GROUP: Potatoes and Starches. RECOMMENDED: Well-cooked, moistened, boiled, baked, or mashed potatoes. Well-cooked shredded hash brown potatoes that are not crisp. (All potatoes need to be moist and in sauces.)Well-cooked noodles in sauce. Spaetzel or soft dumplings that have been moistened with butter or gravy.  AVOID: Potato skins and chips. Fried or French-fried potatoes. Rice.  FOOD GROUP: Soups. RECOMMENDED: Soups with easy-to-chew or easy-to-swallow meats or vegetables: Particle sizes in soups should be less than 1/2 inch. Soups will need to be thickened to appropriate consistency if soup is thinner than prescribed liquid consistency.  AVOID: Soups with large chunks of meat and vegetables. Soups with rice, corn, peas.  FOOD GROUP: Vegetables. RECOMMENDED: All soft, well-cooked vegetables. Vegetables should be less than a half inch. Should be easily mashed with a fork.  AVOID: Cooked corn and peas. Broccoli, cabbage, Brussels sprouts, asparagus, or other fibrous, non-tender or rubbery cooked vegetables.  FOOD GROUP: Miscellaneous. RECOMMENDED: Jams and preserves without seeds, jelly. Sauces, salsas, etc., that may have small tender chunks less than 1/2 inch. Soft, smooth chocolate bars that are easily chewed.  AVOID: Seeds, nuts, coconut, or sticky foods. Chewy candies such as caramels or licorice.

## 2013-10-29 NOTE — Assessment & Plan Note (Signed)
SX CONTROLLED.  BID PPI

## 2013-10-29 NOTE — Progress Notes (Signed)
Cc to PCP 

## 2013-10-29 NOTE — Progress Notes (Signed)
Subjective:    Patient ID: [name] [name], female    DOB: 18-Nov-1940, 73 y.o.   MRN: [id number]  [name], MD  HPI Problems with food going down and coming back up. [employer] AND HAS BEEN DOING ALL RIGHT UNTIL PAST COUPLE OF YEARS. IN PAST 6-8 MOS AS FAR AS THROWING UP AND THINGS. EATS AND WITHIN 5-10 MINS OR MORE MUCOUS AND THEN FOOD COMES UP AFTER 30 MINS. HAD GASTRIC BYPASS AND HAD REVISION BY DR. MCNATT. LAST TCS WAS 73 YO AT [facility]. DOESN'T RECALL HAVING ESOPHAGUS STRETCHED. TAKING FORMED BLACK STOOL DUE TO IRON. BMs: Q1-3 DAYS, BUT HAD BEEN REGULAR. TRIES TO EAT FIBER AND HAS STOOL SOFTENER. HAS VAGINAL POLYPS. IT'S TIME FOR TCS. SORE AND TENDER BELLY AFTER WORKING OUTSIDE. OCCASIONAL HEARTBURN BUT PRILOSEC HELPS. Problems with sedation: POSTOP NAUSEA/VOMTIING.  PT DENIES FEVER, CHILLS, HEMATOCHEZIA, nausea, vomiting, melena, diarrhea, CHEST PAIN, OR SHORTNESS OF BREATH.  Past Medical History  Diagnosis Date  . PONV (postoperative nausea and vomiting)   . Hypertension   . Depression   . Seasonal allergies   . Pneumonia   . Anxiety   . GERD (gastroesophageal reflux disease)   . H/O hiatal hernia   . Arthritis   . Anemia   . Obesity    Past Surgical History  Procedure Laterality Date  . Back surgery      lumbar x2 by Dr Arnoldo Morale  . Tonsillectomy    . Appendectomy    . Abdominal hysterectomy    . Cholecystectomy    . Joint replacement Right 1995    hip  . Joint replacement Left 2008    hip  . Medial partial knee replacement Left     Dr Ronnie Derby  . Shoulder arthroscopy Right   . Shoulder arthroscopy Right     x2  . Cardiac catheterization  1990's    "okay"  . Total shoulder arthroplasty Right 02/16/2013    Procedure: TOTAL SHOULDER ARTHROPLASTY;  Surgeon: Marybelle Killings, MD;  Location: [facility];  Service: Orthopedics;  Laterality: Right;  Right Total Shoulder Arthroplasty, Cemented  . Cervical fusion    . Cataract extraction w/phaco Left 08/06/2013   Procedure: CATARACT EXTRACTION PHACO AND INTRAOCULAR LENS PLACEMENT LEFT EYE;  Surgeon: Tonny Branch, MD;  Location: [facility];  Service: Ophthalmology;  Laterality: Left;  CDE: 9.55  . Cataract extraction w/phaco Right 08/24/2013    Procedure: CATARACT EXTRACTION PHACO AND INTRAOCULAR LENS PLACEMENT (IOC);  Surgeon: Tonny Branch, MD;  Location: [facility];  Service: Ophthalmology;  Laterality: Right;  CDE:8.30  . Eye surgery     Allergies  Allergen Reactions  . Novocain [Procaine Hcl]     Unknown-passed out with novocaine injected for dental surgery.  . Other Hives    EKG pads - need to use pediatric pads  . Latex Rash  . Penicillins Rash  . Sulfa Antibiotics Rash    Current Outpatient Prescriptions  Medication Sig Dispense Refill  . Calcium Citrate-Vitamin D (CITRACAL PETITES/VITAMIN D) 200-250 MG-UNIT TABS Take 1 tablet by mouth daily.      . diazepam (VALIUM) 5 MG tablet Take 5 mg by mouth 3 (three) times daily as needed. For anxiety      . diphenhydrAMINE (BENADRYL) 25 MG tablet Take 25 mg by mouth every 6 (six) hours as needed for allergies.    Marland Kitchen docusate sodium (COLACE) 100 MG capsule Take 100 mg by mouth at bedtime.     Marland Kitchen HYDROcodone-acetaminophen (NORCO) 10-325 MG per tablet  Take 1 tablet by mouth every 6 (six) hours as needed for moderate pain.    . iron polysaccharides (NIFEREX) 150 MG capsule Take 1 capsule (150 mg total) by mouth 2 (two) times daily.    Marland Kitchen omeprazole (PRILOSEC) 20 MG capsule Take 20 mg by mouth 2 (two) times daily.    . ondansetron (ZOFRAN) 4 MG tablet Take 1 tablet (4 mg total) by mouth every 6 (six) hours as needed for nausea.    . pantoprazole (PROTONIX) 40 MG tablet Take 1 tablet (40 mg total) by mouth daily.    . potassium chloride (K-DUR) 10 MEQ tablet Take 10 mEq by mouth 3 (three) times daily with meals.     . sertraline (ZOLOFT) 100 MG tablet Take 100 mg by mouth daily.    . traZODone (DESYREL) 50 MG tablet Take 50 mg by mouth at bedtime.    .       Family  History  Problem Relation Age of Onset  . Colon cancer Brother     IN HIS 60s-METASTATIC  . Colon polyps Paternal Grandfather    History   Social History  . Marital Status: Single    Spouse Name: N/A    Number of Children: N/A  . Years of Education: N/A   Social History Main Topics  . Smoking status: Never Smoker   . Smokeless tobacco: Never Used  . Alcohol Use: No  . Drug Use: No  . Sexual Activity: Yes    Birth Control/ Protection: Surgical     Review of Systems PER HPI OTHERWISE ALL SYSTEMS ARE NEGATIVE.     Objective:   Physical Exam  Vitals reviewed. Constitutional: She is oriented to person, place, and time. She appears well-developed and well-nourished. No distress.  HENT:  Head: Normocephalic and atraumatic.  Mouth/Throat: Oropharynx is clear and moist. No oropharyngeal exudate.  Eyes: Pupils are equal, round, and reactive to light. No scleral icterus.  Neck: Normal range of motion. Neck supple.  Cardiovascular: Normal rate, regular rhythm and normal heart sounds.   Pulmonary/Chest: Effort normal and breath sounds normal. No respiratory distress.  Abdominal: Soft. Bowel sounds are normal. She exhibits no distension. There is tenderness.  MILD RLQ TTP   Musculoskeletal: She exhibits no edema.  BILATERAL VARICOSE VEINS  Lymphadenopathy:    She has no cervical adenopathy.  Neurological: She is alert and oriented to person, place, and time.  NO FOCAL DEFICITS  Psychiatric: She has a normal mood and affect.          Assessment & Plan:

## 2013-11-02 MED ORDER — ONDANSETRON HCL 4 MG/2ML IJ SOLN: 4.0000 mg | Freq: Once | INTRAMUSCULAR | Status: AC | PRN

## 2013-11-02 MED ORDER — FENTANYL CITRATE 0.05 MG/ML IJ SOLN: 25.0000 ug | INTRAMUSCULAR | Status: DC | PRN

## 2013-11-03 ENCOUNTER — Ambulatory Visit (HOSPITAL_COMMUNITY)
Admission: RE | Admit: 2013-11-03 | Discharge: 2013-11-03 | Disposition: A | Discharge status: Home / Self Care | Payer: Medicare Other | Source: Ambulatory Visit | Attending: Gastroenterology | Admitting: Gastroenterology

## 2013-11-03 ENCOUNTER — Encounter (HOSPITAL_COMMUNITY): Payer: Self-pay | Admitting: *Deleted

## 2013-11-03 ENCOUNTER — Encounter (HOSPITAL_COMMUNITY)
Admission: RE | Disposition: A | Discharge status: Home / Self Care | Payer: Self-pay | Source: Ambulatory Visit | Attending: Gastroenterology

## 2013-11-03 DIAGNOSIS — K2970 Gastritis, unspecified, without bleeding: Secondary | ICD-10-CM | POA: Diagnosis not present

## 2013-11-03 DIAGNOSIS — Z880 Allergy status to penicillin: Secondary | ICD-10-CM | POA: Diagnosis not present

## 2013-11-03 DIAGNOSIS — D128 Benign neoplasm of rectum: Secondary | ICD-10-CM | POA: Diagnosis not present

## 2013-11-03 DIAGNOSIS — Z91040 Latex allergy status: Secondary | ICD-10-CM | POA: Insufficient documentation

## 2013-11-03 DIAGNOSIS — Z96649 Presence of unspecified artificial hip joint: Secondary | ICD-10-CM | POA: Insufficient documentation

## 2013-11-03 DIAGNOSIS — R1310 Dysphagia, unspecified: Secondary | ICD-10-CM

## 2013-11-03 DIAGNOSIS — M129 Arthropathy, unspecified: Secondary | ICD-10-CM | POA: Insufficient documentation

## 2013-11-03 DIAGNOSIS — D649 Anemia, unspecified: Secondary | ICD-10-CM

## 2013-11-03 DIAGNOSIS — Z83719 Family history of colon polyps, unspecified: Secondary | ICD-10-CM | POA: Insufficient documentation

## 2013-11-03 DIAGNOSIS — Z8371 Family history of colonic polyps: Secondary | ICD-10-CM | POA: Insufficient documentation

## 2013-11-03 DIAGNOSIS — F411 Generalized anxiety disorder: Secondary | ICD-10-CM | POA: Diagnosis not present

## 2013-11-03 DIAGNOSIS — Z800 Family history of malignant neoplasm of digestive organs: Secondary | ICD-10-CM | POA: Insufficient documentation

## 2013-11-03 DIAGNOSIS — Z888 Allergy status to other drugs, medicaments and biological substances status: Secondary | ICD-10-CM | POA: Diagnosis not present

## 2013-11-03 DIAGNOSIS — K5730 Diverticulosis of large intestine without perforation or abscess without bleeding: Secondary | ICD-10-CM | POA: Diagnosis not present

## 2013-11-03 DIAGNOSIS — D126 Benign neoplasm of colon, unspecified: Secondary | ICD-10-CM | POA: Diagnosis not present

## 2013-11-03 DIAGNOSIS — K2990 Gastroduodenitis, unspecified, without bleeding: Secondary | ICD-10-CM

## 2013-11-03 DIAGNOSIS — K3189 Other diseases of stomach and duodenum: Secondary | ICD-10-CM | POA: Diagnosis not present

## 2013-11-03 DIAGNOSIS — D129 Benign neoplasm of anus and anal canal: Secondary | ICD-10-CM

## 2013-11-03 DIAGNOSIS — R1013 Epigastric pain: Secondary | ICD-10-CM

## 2013-11-03 DIAGNOSIS — R1319 Other dysphagia: Secondary | ICD-10-CM

## 2013-11-03 DIAGNOSIS — Z882 Allergy status to sulfonamides status: Secondary | ICD-10-CM | POA: Insufficient documentation

## 2013-11-03 DIAGNOSIS — K648 Other hemorrhoids: Secondary | ICD-10-CM | POA: Diagnosis not present

## 2013-11-03 DIAGNOSIS — E669 Obesity, unspecified: Secondary | ICD-10-CM | POA: Insufficient documentation

## 2013-11-03 DIAGNOSIS — K219 Gastro-esophageal reflux disease without esophagitis: Secondary | ICD-10-CM | POA: Insufficient documentation

## 2013-11-03 DIAGNOSIS — Z79899 Other long term (current) drug therapy: Secondary | ICD-10-CM | POA: Diagnosis not present

## 2013-11-03 DIAGNOSIS — F329 Major depressive disorder, single episode, unspecified: Secondary | ICD-10-CM | POA: Insufficient documentation

## 2013-11-03 DIAGNOSIS — I10 Essential (primary) hypertension: Secondary | ICD-10-CM | POA: Diagnosis not present

## 2013-11-03 DIAGNOSIS — F3289 Other specified depressive episodes: Secondary | ICD-10-CM | POA: Diagnosis not present

## 2013-11-03 DIAGNOSIS — D509 Iron deficiency anemia, unspecified: Secondary | ICD-10-CM

## 2013-11-03 DIAGNOSIS — K6389 Other specified diseases of intestine: Secondary | ICD-10-CM | POA: Insufficient documentation

## 2013-11-03 HISTORY — PX: COLONOSCOPY: SHX5424

## 2013-11-03 HISTORY — PX: ESOPHAGOGASTRODUODENOSCOPY: SHX5428

## 2013-11-03 SURGERY — COLONOSCOPY
Anesthesia: Moderate Sedation

## 2013-11-03 MED ORDER — MEPERIDINE HCL 100 MG/ML IJ SOLN
INTRAMUSCULAR | Status: AC
  Filled 2013-11-03: qty 2

## 2013-11-03 MED ORDER — SODIUM CHLORIDE 0.9 % IJ SOLN
INTRAMUSCULAR | Status: AC
  Filled 2013-11-03: qty 10

## 2013-11-03 MED ORDER — FENTANYL CITRATE 0.05 MG/ML IJ SOLN
INTRAMUSCULAR | Status: AC
  Filled 2013-11-03: qty 2

## 2013-11-03 MED ORDER — MEPERIDINE HCL 100 MG/ML IJ SOLN
INTRAMUSCULAR | Status: DC | PRN
  Administered 2013-11-03: 25 mg via INTRAVENOUS
  Administered 2013-11-03: 50 mg via INTRAVENOUS
  Administered 2013-11-03: 25 mg via INTRAVENOUS

## 2013-11-03 MED ORDER — MIDAZOLAM HCL 5 MG/5ML IJ SOLN
INTRAMUSCULAR | Status: AC
  Filled 2013-11-03: qty 10

## 2013-11-03 MED ORDER — LIDOCAINE VISCOUS 2 % MT SOLN
OROMUCOSAL | Status: AC
  Filled 2013-11-03: qty 15

## 2013-11-03 MED ORDER — PROMETHAZINE HCL 25 MG/ML IJ SOLN
INTRAMUSCULAR | Status: AC
  Filled 2013-11-03: qty 1

## 2013-11-03 MED ORDER — MINERAL OIL PO OIL
TOPICAL_OIL | ORAL | Status: AC
  Filled 2013-11-03: qty 30

## 2013-11-03 MED ORDER — STERILE WATER FOR IRRIGATION IR SOLN
Status: DC | PRN
  Administered 2013-11-03: 11:00:00

## 2013-11-03 MED ORDER — SODIUM CHLORIDE 0.9 % IV SOLN
INTRAVENOUS | Status: DC
  Administered 2013-11-03: 11:00:00 via INTRAVENOUS

## 2013-11-03 MED ORDER — MIDAZOLAM HCL 5 MG/5ML IJ SOLN
INTRAMUSCULAR | Status: AC
  Filled 2013-11-03: qty 5

## 2013-11-03 MED ORDER — MIDAZOLAM HCL 5 MG/5ML IJ SOLN
INTRAMUSCULAR | Status: DC | PRN
  Administered 2013-11-03 (×2): 1 mg via INTRAVENOUS
  Administered 2013-11-03 (×2): 2 mg via INTRAVENOUS
  Administered 2013-11-03: 1 mg via INTRAVENOUS
  Administered 2013-11-03: 2 mg via INTRAVENOUS
  Administered 2013-11-03: 1 mg via INTRAVENOUS

## 2013-11-03 MED ORDER — FENTANYL CITRATE 0.05 MG/ML IJ SOLN
INTRAMUSCULAR | Status: DC | PRN
  Administered 2013-11-03 (×3): 25 ug via INTRAVENOUS

## 2013-11-03 MED ORDER — BUTAMBEN-TETRACAINE-BENZOCAINE 2-2-14 % EX AERO
INHALATION_SPRAY | CUTANEOUS | Status: DC | PRN
  Administered 2013-11-03: 2 via TOPICAL

## 2013-11-03 MED ORDER — PROMETHAZINE HCL 25 MG/ML IJ SOLN
12.5000 mg | Freq: Once | INTRAMUSCULAR | Status: AC
  Administered 2013-11-03: 12.5 mg via INTRAVENOUS

## 2013-11-03 NOTE — Interval H&P Note (Signed)
History and Physical Interval Note:  11/03/2013 11:03 AM  [name] [name]  has presented today for surgery, with the diagnosis of ANEMIA  AND DYSPHAGIA  The various methods of treatment have been discussed with the patient and family. After consideration of risks, benefits and other options for treatment, the patient has consented to  Procedure(s) with comments: COLONOSCOPY (N/A) - 11:15 ESOPHAGOGASTRODUODENOSCOPY (EGD) (N/A) SAVORY DILATION (N/A) MALONEY DILATION (N/A) as a surgical intervention .  The patient's history has been reviewed, patient examined, no change in status, stable for surgery.  I have reviewed the patient's chart and labs.  Questions were answered to the patient's satisfaction.     [employer]

## 2013-11-03 NOTE — H&P (View-Only) (Signed)
Subjective:    Patient ID: [name] [name], female    DOB: 31-May-1940, 73 y.o.   MRN: [id number]  [name], MD  HPI Problems with food going down and coming back up. [employer] AND HAS BEEN DOING ALL RIGHT UNTIL PAST COUPLE OF YEARS. IN PAST 6-8 MOS AS FAR AS THROWING UP AND THINGS. EATS AND WITHIN 5-10 MINS OR MORE MUCOUS AND THEN FOOD COMES UP AFTER 30 MINS. HAD GASTRIC BYPASS AND HAD REVISION BY DR. MCNATT. LAST TCS WAS 73 YO AT [facility]. DOESN'T RECALL HAVING ESOPHAGUS STRETCHED. TAKING FORMED BLACK STOOL DUE TO IRON. BMs: Q1-3 DAYS, BUT HAD BEEN REGULAR. TRIES TO EAT FIBER AND HAS STOOL SOFTENER. HAS VAGINAL POLYPS. IT'S TIME FOR TCS. SORE AND TENDER BELLY AFTER WORKING OUTSIDE. OCCASIONAL HEARTBURN BUT PRILOSEC HELPS. Problems with sedation: POSTOP NAUSEA/VOMTIING.  PT DENIES FEVER, CHILLS, HEMATOCHEZIA, nausea, vomiting, melena, diarrhea, CHEST PAIN, OR SHORTNESS OF BREATH.  Past Medical History  Diagnosis Date  . PONV (postoperative nausea and vomiting)   . Hypertension   . Depression   . Seasonal allergies   . Pneumonia   . Anxiety   . GERD (gastroesophageal reflux disease)   . H/O hiatal hernia   . Arthritis   . Anemia   . Obesity    Past Surgical History  Procedure Laterality Date  . Back surgery      lumbar x2 by Dr Arnoldo Morale  . Tonsillectomy    . Appendectomy    . Abdominal hysterectomy    . Cholecystectomy    . Joint replacement Right 1995    hip  . Joint replacement Left 2008    hip  . Medial partial knee replacement Left     Dr Ronnie Derby  . Shoulder arthroscopy Right   . Shoulder arthroscopy Right     x2  . Cardiac catheterization  1990's    "okay"  . Total shoulder arthroplasty Right 02/16/2013    Procedure: TOTAL SHOULDER ARTHROPLASTY;  Surgeon: Marybelle Killings, MD;  Location: [facility];  Service: Orthopedics;  Laterality: Right;  Right Total Shoulder Arthroplasty, Cemented  . Cervical fusion    . Cataract extraction w/phaco Left 08/06/2013   Procedure: CATARACT EXTRACTION PHACO AND INTRAOCULAR LENS PLACEMENT LEFT EYE;  Surgeon: Tonny Branch, MD;  Location: [facility];  Service: Ophthalmology;  Laterality: Left;  CDE: 9.55  . Cataract extraction w/phaco Right 08/24/2013    Procedure: CATARACT EXTRACTION PHACO AND INTRAOCULAR LENS PLACEMENT (IOC);  Surgeon: Tonny Branch, MD;  Location: [facility];  Service: Ophthalmology;  Laterality: Right;  CDE:8.30  . Eye surgery     Allergies  Allergen Reactions  . Novocain [Procaine Hcl]     Unknown-passed out with novocaine injected for dental surgery.  . Other Hives    EKG pads - need to use pediatric pads  . Latex Rash  . Penicillins Rash  . Sulfa Antibiotics Rash    Current Outpatient Prescriptions  Medication Sig Dispense Refill  . Calcium Citrate-Vitamin D (CITRACAL PETITES/VITAMIN D) 200-250 MG-UNIT TABS Take 1 tablet by mouth daily.      . diazepam (VALIUM) 5 MG tablet Take 5 mg by mouth 3 (three) times daily as needed. For anxiety      . diphenhydrAMINE (BENADRYL) 25 MG tablet Take 25 mg by mouth every 6 (six) hours as needed for allergies.    Marland Kitchen docusate sodium (COLACE) 100 MG capsule Take 100 mg by mouth at bedtime.     Marland Kitchen HYDROcodone-acetaminophen (NORCO) 10-325 MG per tablet  Take 1 tablet by mouth every 6 (six) hours as needed for moderate pain.    . iron polysaccharides (NIFEREX) 150 MG capsule Take 1 capsule (150 mg total) by mouth 2 (two) times daily.    Marland Kitchen omeprazole (PRILOSEC) 20 MG capsule Take 20 mg by mouth 2 (two) times daily.    . ondansetron (ZOFRAN) 4 MG tablet Take 1 tablet (4 mg total) by mouth every 6 (six) hours as needed for nausea.    . pantoprazole (PROTONIX) 40 MG tablet Take 1 tablet (40 mg total) by mouth daily.    . potassium chloride (K-DUR) 10 MEQ tablet Take 10 mEq by mouth 3 (three) times daily with meals.     . sertraline (ZOLOFT) 100 MG tablet Take 100 mg by mouth daily.    . traZODone (DESYREL) 50 MG tablet Take 50 mg by mouth at bedtime.    .       Family  History  Problem Relation Age of Onset  . Colon cancer Brother     IN HIS 60s-METASTATIC  . Colon polyps Paternal Grandfather    History   Social History  . Marital Status: Single    Spouse Name: N/A    Number of Children: N/A  . Years of Education: N/A   Social History Main Topics  . Smoking status: Never Smoker   . Smokeless tobacco: Never Used  . Alcohol Use: No  . Drug Use: No  . Sexual Activity: Yes    Birth Control/ Protection: Surgical     Review of Systems PER HPI OTHERWISE ALL SYSTEMS ARE NEGATIVE.     Objective:   Physical Exam  Vitals reviewed. Constitutional: She is oriented to person, place, and time. She appears well-developed and well-nourished. No distress.  HENT:  Head: Normocephalic and atraumatic.  Mouth/Throat: Oropharynx is clear and moist. No oropharyngeal exudate.  Eyes: Pupils are equal, round, and reactive to light. No scleral icterus.  Neck: Normal range of motion. Neck supple.  Cardiovascular: Normal rate, regular rhythm and normal heart sounds.   Pulmonary/Chest: Effort normal and breath sounds normal. No respiratory distress.  Abdominal: Soft. Bowel sounds are normal. She exhibits no distension. There is tenderness.  MILD RLQ TTP   Musculoskeletal: She exhibits no edema.  BILATERAL VARICOSE VEINS  Lymphadenopathy:    She has no cervical adenopathy.  Neurological: She is alert and oriented to person, place, and time.  NO FOCAL DEFICITS  Psychiatric: She has a normal mood and affect.          Assessment & Plan:

## 2013-11-03 NOTE — Discharge Instructions (Signed)
You had 13 polyps removed. I PLACED A METAL CLIP TO PREVENT BLEEDING IN YOUR COLON IN 7-10 DAYS. YOU HAVE INTERNAL HEMORRHOIDS and diverticulosis in your left colon. YOU HAVE GASTRITIS. I BIOPSIED YOUR STOMACH.    NO MRI for 30 days.  CONTINUE OMEPRAZOLE.  TAKE 30 MINUTES PRIOR TO YOUR MEALS TWICE DAILY.  FOLLOW A gastric by pass diet. AVOID ITEMS THAT CAUSE BLOATING.  MEATS SHOULD BE CHOPPED OR GROUND ONLY. DO NOT EAT CHUNKS OF ANYTHING. SEE INFO BELOW.  USE FIBER POWDER 1 PACKET ONCE DAILY FOR 3 DAYS THEN TWICE DAILY FOR 3 DAYS THEN THREE TIMES A DAY.  YOUR BIOPSY WILL BE BACK IN 14 DAYS.  Follow up in 4 mos.  Next colonoscopy in 10 years.    ENDOSCOPY Care After Read the instructions outlined below and refer to this sheet in the next week. These discharge instructions provide you with general information on caring for yourself after you leave the hospital. While your treatment has been planned according to the most current medical practices available, unavoidable complications occasionally occur. If you have any problems or questions after discharge, call DR. Erminie Foulks, [phone].  ACTIVITY  You may resume your regular activity, but move at a slower pace for the next 24 hours.   Take frequent rest periods for the next 24 hours.   Walking will help get rid of the air and reduce the bloated feeling in your belly (abdomen).   No driving for 24 hours (because of the medicine (anesthesia) used during the test).   You may shower.   Do not sign any important legal documents or operate any machinery for 24 hours (because of the anesthesia used during the test).    NUTRITION  Drink plenty of fluids.   You may resume your normal diet as instructed by your doctor.   Begin with a light meal and progress to your normal diet. Heavy or fried foods are harder to digest and may make you feel sick to your stomach (nauseated).   Avoid alcoholic beverages for 24 hours or as instructed.      MEDICATIONS  You may resume your normal medications.   WHAT YOU CAN EXPECT TODAY  Some feelings of bloating in the abdomen.   Passage of more gas than usual.   Spotting of blood in your stool or on the toilet paper  .  IF YOU HAD POLYPS REMOVED DURING THE ENDOSCOPY:  Eat a soft diet IF YOU HAVE NAUSEA, BLOATING, ABDOMINAL PAIN, OR VOMITING.    FINDING OUT THE RESULTS OF YOUR TEST Not all test results are available during your visit. DR. Oneida Alar WILL CALL YOU WITHIN 7 DAYS OF YOUR PROCEDUE WITH YOUR RESULTS. Do not assume everything is normal if you have not heard from DR. Dollye Glasser IN ONE WEEK, CALL HER OFFICE AT [phone].  SEEK IMMEDIATE MEDICAL ATTENTION AND CALL THE OFFICE: [phone] IF:  You have more than a spotting of blood in your stool.   Your belly is swollen (abdominal distention).   You are nauseated or vomiting.   You have a temperature over 101F.   You have abdominal pain or discomfort that is severe or gets worse throughout the day.   Low-Fat Diet BREADS, CEREALS, PASTA, RICE, DRIED PEAS, AND BEANS These products are high in carbohydrates and most are low in fat. Therefore, they can be increased in the diet as substitutes for fatty foods. They too, however, contain calories and should not be eaten in excess. Cereals can be  eaten for snacks as well as for breakfast.   FRUITS AND VEGETABLES It is good to eat fruits and vegetables. Besides being sources of fiber, both are rich in vitamins and some minerals. They help you get the daily allowances of these nutrients. Fruits and vegetables can be used for snacks and desserts.  MEATS Limit lean meat, chicken, [place], and fish to no more than 6 ounces per day. Beef, Pork, and Lamb Use lean cuts of beef, pork, and lamb. Lean cuts include:  Extra-lean ground beef.  Arm roast.  Sirloin tip.  Center-cut ham.  Round steak.  Loin chops.  Rump roast.  Tenderloin.  Trim all fat off the outside of meats  before cooking. It is not necessary to severely decrease the intake of red meat, but lean choices should be made. Lean meat is rich in protein and contains a highly absorbable form of iron. Premenopausal women, in particular, should avoid reducing lean red meat because this could increase the risk for low red blood cells (iron-deficiency anemia).  Chicken and [place] These are good sources of protein. The fat of poultry can be reduced by removing the skin and underlying fat layers before cooking. Chicken and [place] can be substituted for lean red meat in the diet. Poultry should not be fried or covered with high-fat sauces. Fish and Shellfish Fish is a good source of protein. Shellfish contain cholesterol, but they usually are low in saturated fatty acids. The preparation of fish is important. Like chicken and [place], they should not be fried or covered with high-fat sauces. EGGS Egg whites contain no fat or cholesterol. They can be eaten often. Try 1 to 2 egg whites instead of whole eggs in recipes or use egg substitutes that do not contain yolk. MILK AND DAIRY PRODUCTS Use skim or 1% milk instead of 2% or whole milk. Decrease whole milk, natural, and processed cheeses. Use nonfat or low-fat (2%) cottage cheese or low-fat cheeses made from vegetable oils. Choose nonfat or low-fat (1 to 2%) yogurt. Experiment with evaporated skim milk in recipes that call for heavy cream. Substitute low-fat yogurt or low-fat cottage cheese for sour cream in dips and salad dressings. Have at least 2 servings of low-fat dairy products, such as 2 glasses of skim (or 1%) milk each day to help get your daily calcium intake. FATS AND OILS Reduce the total intake of fats, especially saturated fat. Butterfat, lard, and beef fats are high in saturated fat and cholesterol. These should be avoided as much as possible. Vegetable fats do not contain cholesterol, but certain vegetable fats, such as coconut oil, palm oil, and palm kernel  oil are very high in saturated fats. These should be limited. These fats are often used in bakery goods, processed foods, popcorn, oils, and nondairy creamers. Vegetable shortenings and some peanut butters contain hydrogenated oils, which are also saturated fats. Read the labels on these foods and check for saturated vegetable oils. Unsaturated vegetable oils and fats do not raise blood cholesterol. However, they should be limited because they are fats and are high in calories. Total fat should still be limited to 30% of your daily caloric intake. Desirable liquid vegetable oils are corn oil, cottonseed oil, olive oil, canola oil, safflower oil, soybean oil, and sunflower oil. Peanut oil is not as good, but small amounts are acceptable. Buy a heart-healthy tub margarine that has no partially hydrogenated oils in the ingredients. Mayonnaise and salad dressings often are made from unsaturated fats, but they should  also be limited because of their high calorie and fat content. Seeds, nuts, peanut butter, olives, and avocados are high in fat, but the fat is mainly the unsaturated type. These foods should be limited mainly to avoid excess calories and fat. OTHER EATING TIPS Snacks  Most sweets should be limited as snacks. They tend to be rich in calories and fats, and their caloric content outweighs their nutritional value. Some good choices in snacks are graham crackers, melba toast, soda crackers, bagels (no egg), English muffins, fruits, and vegetables. These snacks are preferable to snack crackers, [place] fries, TORTILLA CHIPS, and POTATO chips. Popcorn should be air-popped or cooked in small amounts of liquid vegetable oil. Desserts Eat fruit, low-fat yogurt, and fruit ices instead of pastries, cake, and cookies. Sherbet, angel food cake, gelatin dessert, frozen low-fat yogurt, or other frozen products that do not contain saturated fat (pure fruit juice bars, frozen ice pops) are also acceptable.  COOKING  METHODS Choose those methods that use little or no fat. They include: Poaching.  Braising.  Steaming.  Grilling.  Baking.  Stir-frying.  Broiling.  Microwaving.  Foods can be cooked in a nonstick pan without added fat, or use a nonfat cooking spray in regular cookware. Limit fried foods and avoid frying in saturated fat. Add moisture to lean meats by using water, broth, cooking wines, and other nonfat or low-fat sauces along with the cooking methods mentioned above. Soups and stews should be chilled after cooking. The fat that forms on top after a few hours in the refrigerator should be skimmed off. When preparing meals, avoid using excess salt. Salt can contribute to raising blood pressure in some people.  EATING AWAY FROM HOME Order entres, potatoes, and vegetables without sauces or butter. When meat exceeds the size of a deck of cards (3 to 4 ounces), the rest can be taken home for another meal. Choose vegetable or fruit salads and ask for low-calorie salad dressings to be served on the side. Use dressings sparingly. Limit high-fat toppings, such as bacon, crumbled eggs, cheese, sunflower seeds, and olives. Ask for heart-healthy tub margarine instead of butter.   Polyps, Colon  A polyp is extra tissue that grows inside your body. Colon polyps grow in the large intestine. The large intestine, also called the colon, is part of your digestive system. It is a long, hollow tube at the end of your digestive tract where your body makes and stores stool. Most polyps are not dangerous. They are benign. This means they are not cancerous. But over time, some types of polyps can turn into cancer. Polyps that are smaller than a pea are usually not harmful. But larger polyps could someday become or may already be cancerous. To be safe, doctors remove all polyps and test them.   WHO GETS POLYPS? Anyone can get polyps, but certain people are more likely than others. You may have a greater chance of  getting polyps if:  You are over 50.   You have had polyps before.   Someone in your family has had polyps.   Someone in your family has had cancer of the large intestine.   Find out if someone in your family has had polyps. You may also be more likely to get polyps if you:   Eat a lot of fatty foods   Smoke   Drink alcohol   Do not exercise  Eat too much   TREATMENT  The caregiver will remove the polyp during sigmoidoscopy or colonoscopy.  PREVENTION There is not one sure way to prevent polyps. You might be able to lower your risk of getting them if you:  Eat more fruits and vegetables and less fatty food.   Do not smoke.   Avoid alcohol.   Exercise every day.   Lose weight if you are overweight.   Eating more calcium and folate can also lower your risk of getting polyps. Some foods that are rich in calcium are milk, cheese, and broccoli. Some foods that are rich in folate are chickpeas, kidney beans, and spinach.    Hemorrhoids Hemorrhoids are dilated (enlarged) veins around the rectum. Sometimes clots will form in the veins. This makes them swollen and painful. These are called thrombosed hemorrhoids. Causes of hemorrhoids include:  Constipation.   Straining to have a bowel movement.   HEAVY LIFTING HOME CARE INSTRUCTIONS  Eat a well balanced diet and drink 6 to 8 glasses of water every day to avoid constipation. You may also use a bulk laxative.   Avoid straining to have bowel movements.   Keep anal area dry and clean.   Do not use a donut shaped pillow or sit on the toilet for long periods. This increases blood pooling and pain.   Move your bowels when your body has the urge; this will require less straining and will decrease pain and pressure.

## 2013-11-04 NOTE — Op Note (Signed)
[facility] [address] [place], [address]   COLONOSCOPY PROCEDURE REPORT  PATIENT: [name] [name], [name] [name]  MR#: [id number] BIRTHDATE: 08-04-1940 , 72  yrs. old GENDER: Female ENDOSCOPIST: Barney Drain, MD REFERRED GQ:QPYPP Howard, M.D. PROCEDURE DATE:  11/03/2013 PROCEDURE:   Colonoscopy with snare polypectomy and Colonoscopy with cold biopsy polypectomy INDICATIONS:Anemia, non-specific.  LAST TCS NO POLYPS MEDICATIONS: Demerol 100 mg IV, Fentanyl 25 mcg IV, Versed 9 mg IV, and Promethazine (Phenergan) 12.5mg  IV  DESCRIPTION OF PROCEDURE:    Physical exam was performed.  Informed consent was obtained from the patient after explaining the benefits, risks, and alternatives to procedure.  The patient was connected to monitor and placed in left lateral position. Continuous oxygen was provided by nasal cannula and IV medicine administered through an indwelling cannula.  After administration of sedation and rectal exam, the patients rectum was intubated and the EC-3890Li ([id number]) and EG-2990i ([id number])  colonoscope was advanced under direct visualization to the ileum.  The scope was removed slowly by carefully examining the color, texture, anatomy, and integrity mucosa on the way out.  The patient was recovered in endoscopy and discharged home in satisfactory condition.    COLON FINDINGS: The mucosa appeared normal in the terminal ileum.  , Six sessile polyps measuring 6-12 mm in size were found at the cecum, in the ascending colon, transverse colon, and descending colon.  A polypectomy was performed using snare cautery.  ONE POLYP REMOVED VIA ROTH NET, Seven sessile polyps measuring 2-4 mm in size were found at the cecum, in the ascending colon, transverse colon, sigmoid colon, and rectum.  A polypectomy was performed with cold forceps.  , There was moderate diverticulosis noted in the descending colon and sigmoid colon with associated tortuosity and muscular  hypertrophy.  , The colon was redundant.  Manual abdominal counter-pressure was used to reach the cecum.  The patient was moved on to their back to reach the cecum, and Small internal hemorrhoids were found.  PREP QUALITY: good.  CECAL W/D TIME: 62 minutes     COMPLICATIONS: None  ENDOSCOPIC IMPRESSION: 1.   Normal mucosa in the terminal ileum 2.   13 COLON POLYPS REMOVED 3.   Moderate diverticulosis in the descending colon and sigmoid colon 4.   The LEFT colon IS redundant 5.   Small internal hemorrhoids  RECOMMENDATIONS: NO MRI for 30 days. CONTINUE OMEPRAZOLE.  TAKE 30 MINUTES PRIOR TO YOUR MEALS TWICE DAILY. FOLLOW A gastric by pass diet.  AVOID ITEMS THAT CAUSE BLOATING. MEATS SHOULD BE CHOPPED OR GROUND ONLY.  DO NOT EAT CHUNKS OF ANYTHING. USE FIBER POWDER 1 PACKET ONCE DAILY FOR 3 DAYS THEN TWICE DAILY FOR 3 DAYS THEN THREE TIMES A DAY. BIOPSY WILL BE BACK IN 14 DAYS. Follow up in 4 mos.  Next colonoscopy in 10 years WITH AN OVERTUBE     _______________________________eSigned:  Barney Drain, MD 11/04/2013 12:15 PM

## 2013-11-04 NOTE — Op Note (Signed)
[facility] [address] [place], [address]   ENDOSCOPY PROCEDURE REPORT  PATIENT: [name] [name], [name] [name]  MR#: [id number] BIRTHDATE: [birth year] , 72  yrs. old GENDER: Female  ENDOSCOPIST: Barney Drain, MD REFERRED BY:  PROCEDURE DATE: 11/03/2013 PROCEDURE:   EGD w/ biopsy  INDICATIONS:Dyspepsia.   Dysphagia. MEDICATIONS: TCS+  Fentanyl 50 mcg IV and Versed 1mg  IV TOPICAL ANESTHETIC:   Viscous Xylocaine  DESCRIPTION OF PROCEDURE:     Physical exam was performed.  Informed consent was obtained from the patient after explaining the benefits, risks, and alternatives to the procedure.  The patient was connected to the monitor and placed in the left lateral position.  Continuous oxygen was provided by nasal cannula and IV medicine administered through an indwelling cannula.  After administration of sedation, the patients esophagus was intubated and the EG-2990i ([id number])  endoscope was advanced under direct visualization to the second portion of the duodenum.  The scope was removed slowly by carefully examining the color, texture, anatomy, and integrity of the mucosa on the way out.  The patient was recovered in endoscopy and discharged home in satisfactory condition.   ESOPHAGUS: The mucosa of the esophagus appeared normal.   STOMACH: GASTRIC POUCH IS 2 CM.  OBSERVED POUCH SLIDING ABOVE THE DIAPHRAGM. Mild non-erosive gastritis (inflammation) was found in the cardia.  Multiple biopsies were performed.   JEJUNUM: The exam showed no abnormalities in the jejunum. COMPLICATIONS:   None  ENDOSCOPIC IMPRESSION: 1.   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 AND NON-ADHERENCE TO GASTRIC BYPASS DIET 2.   MILD Non-erosive gastritis  RECOMMENDATIONS: NO MRI for 30 days. CONTINUE OMEPRAZOLE.  TAKE 30 MINUTES PRIOR TO YOUR MEALS TWICE DAILY. FOLLOW A gastric by pass diet.  AVOID ITEMS THAT CAUSE BLOATING. MEATS SHOULD BE CHOPPED OR GROUND ONLY.  DO NOT EAT  CHUNKS OF ANYTHING. USE FIBER POWDER 1 PACKET ONCE DAILY FOR 3 DAYS THEN TWICE DAILY FOR 3 DAYS THEN THREE TIMES A DAY. BIOPSY WILL BE BACK IN 14 DAYS. Follow up in 4 mos.  Next colonoscopy in 10 years WITH AN OVERTUBE   REPEAT EXAM:   _______________________________ Lorrin MaisBarney Drain, MD 11/04/2013 12:21 PM

## 2013-11-06 ENCOUNTER — Encounter (HOSPITAL_COMMUNITY): Payer: Self-pay | Admitting: Gastroenterology

## 2013-11-12 ENCOUNTER — Telehealth: Payer: Self-pay | Admitting: Gastroenterology

## 2013-11-12 NOTE — Telephone Encounter (Signed)
Called and told pt I do not have those results yet. I will call when Dr. Oneida Alar signs off.

## 2013-11-12 NOTE — Telephone Encounter (Signed)
Pt called to see if her results from her tcs done on 9/1 were available. Please call [phone]

## 2013-11-16 NOTE — Telephone Encounter (Addendum)
Please call pt. HER LOW BLOOD COUNT IS DUE TO COLON POLYPS AND GASTRITIS. SHE had simple adenomas AND ONE SERRATED ADENOMA  removed from her colon. HER stomach Bx shows gastritis.   NO MRI UNTIL AFTER OCT 1 DUE TO METAL CLIP IN COLON. CONTINUE OMEPRAZOLE.  TAKE 30 MINUTES PRIOR TO YOUR MEALS TWICE DAILY. FOLLOW A gastric by pass diet. AVOID ITEMS THAT CAUSE BLOATING.  MEATS SHOULD BE CHOPPED OR GROUND ONLY. DO NOT EAT CHUNKS OF ANYTHING.  USE FIBER POWDER 1 PACKET THREE TIMES A DAY. AVOID ITEMS THAT CAUSE BLOATING & GAS.  Follow up in JAN 2016 [place].  Next colonoscopy in 3 years WITH A 1 HR TIME SLOT AND AN OVERTUBE.

## 2013-11-17 NOTE — Telephone Encounter (Signed)
PATIENT NIC'D 3 YR COLONOSCOPY

## 2013-11-17 NOTE — Telephone Encounter (Signed)
Called and informed pt. Routing to [place] to nic 3 year repeat.

## 2013-11-25 ENCOUNTER — Ambulatory Visit (INDEPENDENT_AMBULATORY_CARE_PROVIDER_SITE_OTHER): Payer: Medicare Other | Admitting: Obstetrics and Gynecology

## 2013-11-25 ENCOUNTER — Encounter: Payer: Self-pay | Admitting: Obstetrics and Gynecology

## 2013-11-25 VITALS — BP 140/82 | Ht 63.0 in | Wt 222.0 lb

## 2013-11-25 DIAGNOSIS — L909 Atrophic disorder of skin, unspecified: Secondary | ICD-10-CM

## 2013-11-25 DIAGNOSIS — L738 Other specified follicular disorders: Secondary | ICD-10-CM | POA: Diagnosis not present

## 2013-11-25 DIAGNOSIS — L919 Hypertrophic disorder of the skin, unspecified: Secondary | ICD-10-CM

## 2013-11-25 DIAGNOSIS — L918 Other hypertrophic disorders of the skin: Secondary | ICD-10-CM

## 2013-11-25 NOTE — Progress Notes (Signed)
Patient ID: [name] [name], female   DOB: [birth year], 73 y.o.   MRN: [id number] Pt here today for a vaginal polyp. Pt denies any problems or concerns at this time.

## 2013-11-25 NOTE — Progress Notes (Signed)
[facility] Visit  Patient name: [name] [name] MRN [id number]  Date of birth: 11/02/1940  CC & HPI:  [name] [name] is a 73 y.o. female presenting today for a vaginal polyp. She states that Dr. Oneida Alar noticed that polyp. She states that Dr. Oneida Alar removed 13 colon polyps. She states that she could feel the polyps, and she would grab a mirror. She states that when she would wipe, she couldn't wipe properly and it bothered her. She denies any problems or concerns at this time. She states that she is 30 s/p TVH and BSO. She states that she has had two hip replacements and a left knee replacement.   ROS:  +Vaginal Polyps No other complaints  Pertinent History Reviewed:   Reviewed: Significant for  Medical         Past Medical History  Diagnosis Date  . PONV (postoperative nausea and vomiting)   . Hypertension   . Depression   . Seasonal allergies   . Pneumonia   . Anxiety   . GERD (gastroesophageal reflux disease)   . H/O hiatal hernia   . Arthritis   . Anemia   . Obesity   . Sepsis 10/02/2013                              Surgical Hx:    Past Surgical History  Procedure Laterality Date  . Back surgery      lumbar x2 by Dr Arnoldo Morale  . Tonsillectomy    . Appendectomy    . Abdominal hysterectomy    . Cholecystectomy    . Joint replacement Right 1995    hip  . Joint replacement Left 2008    hip  . Medial partial knee replacement Left     Dr Ronnie Derby  . Shoulder arthroscopy Right   . Shoulder arthroscopy Right     x2  . Cardiac catheterization  1990's    "okay"  . Total shoulder arthroplasty Right 02/16/2013    Procedure: TOTAL SHOULDER ARTHROPLASTY;  Surgeon: Marybelle Killings, MD;  Location: [facility];  Service: Orthopedics;  Laterality: Right;  Right Total Shoulder Arthroplasty, Cemented  . Cervical fusion    . Cataract extraction w/phaco Left 08/06/2013    Procedure: CATARACT EXTRACTION PHACO AND INTRAOCULAR LENS PLACEMENT LEFT EYE;  Surgeon: Tonny Branch, MD;  Location:  [facility];  Service: Ophthalmology;  Laterality: Left;  CDE: 9.55  . Cataract extraction w/phaco Right 08/24/2013    Procedure: CATARACT EXTRACTION PHACO AND INTRAOCULAR LENS PLACEMENT (IOC);  Surgeon: Tonny Branch, MD;  Location: [facility];  Service: Ophthalmology;  Laterality: Right;  CDE:8.30  . Eye surgery    . Paraesophageal hernia repair  SEP 2010 DR. MCNATT  . Colonoscopy N/A 11/03/2013    Procedure: COLONOSCOPY;  Surgeon: Danie Binder, MD;  Location: [facility];  Service: Endoscopy;  Laterality: N/A;  11:15  . Esophagogastroduodenoscopy N/A 11/03/2013    Procedure: ESOPHAGOGASTRODUODENOSCOPY (EGD);  Surgeon: Danie Binder, MD;  Location: [facility];  Service: Endoscopy;  Laterality: N/A;   Medications: Reviewed & Updated - see associated section                      Current outpatient prescriptions:Calcium Citrate-Vitamin D (CITRACAL PETITES/VITAMIN D) 200-250 MG-UNIT TABS, Take 1 tablet by mouth daily., Disp: , Rfl: ;  diazepam (VALIUM) 5 MG tablet, Take 5 mg by mouth 3 (three) times  daily as needed. For anxiety, Disp: , Rfl: ;  diphenhydrAMINE (BENADRYL) 25 MG tablet, Take 25 mg by mouth every 6 (six) hours as needed for allergies., Disp: , Rfl:  docusate sodium (COLACE) 100 MG capsule, Take 100 mg by mouth at bedtime. , Disp: , Rfl: ;  HYDROcodone-acetaminophen (NORCO) 10-325 MG per tablet, Take 1 tablet by mouth every 6 (six) hours as needed for moderate pain., Disp: , Rfl: ;  iron polysaccharides (NIFEREX) 150 MG capsule, Take 1 capsule (150 mg total) by mouth 2 (two) times daily., Disp: 60 capsule, Rfl: 1 omeprazole (PRILOSEC) 20 MG capsule, Take 20 mg by mouth 2 (two) times daily., Disp: , Rfl: ;  ondansetron (ZOFRAN) 4 MG tablet, Take 1 tablet (4 mg total) by mouth every 6 (six) hours as needed for nausea., Disp: 20 tablet, Rfl: 0;  pantoprazole (PROTONIX) 40 MG tablet, Take 1 tablet (40 mg total) by mouth daily., Disp: 30 tablet, Rfl: 0 potassium chloride (K-DUR) 10 MEQ tablet, Take 10 mEq  by mouth 3 (three) times daily with meals. , Disp: , Rfl: ;  sertraline (ZOLOFT) 100 MG tablet, Take 100 mg by mouth daily., Disp: , Rfl: ;  traZODone (DESYREL) 50 MG tablet, Take 50 mg by mouth at bedtime., Disp: , Rfl:    Social History: Reviewed -  reports that she has never smoked. She has never used smokeless tobacco.  Objective Findings:  Vitals: Blood pressure 140/82, height 5\' 3"  (1.6 m), weight 222 lb (100.699 kg).  Physical Examination:  Pelvic - normal external genitalia, vulva, vagina, cervix, uterus and adnexa,  Multiple skin tags in the right inguinal crease, multiple sebaceous cyst bilaterally in the labia majora. Peri-anal tissues are grossly nl.  VULVA:as above VAGINA: normal appearing vagina with normal color and discharge, no lesions, cervix removed at hyst     Assessment & Plan:   A:  1. Sebaceous cyst bilaterally on the labia majora 2. Multiple skin tags in the right inguinal crease 3. Condyloma ? Of ing crease  P:  1. F/U on a Tuesday in October for removal    This chart was scribed for Jonnie Kind, MD by Steva Colder, ED Scribe. The patient was seen in room 2 at 10:40 AM.

## 2013-12-18 ENCOUNTER — Other Ambulatory Visit: Payer: Medicare Other | Admitting: Obstetrics and Gynecology

## 2013-12-21 ENCOUNTER — Encounter: Payer: Self-pay | Admitting: Obstetrics and Gynecology

## 2013-12-21 ENCOUNTER — Ambulatory Visit (INDEPENDENT_AMBULATORY_CARE_PROVIDER_SITE_OTHER): Payer: Medicare Other | Admitting: Obstetrics and Gynecology

## 2013-12-21 VITALS — BP 148/76 | Ht 65.0 in | Wt 222.0 lb

## 2013-12-21 DIAGNOSIS — N842 Polyp of vagina: Secondary | ICD-10-CM

## 2013-12-21 DIAGNOSIS — L700 Acne vulgaris: Secondary | ICD-10-CM

## 2013-12-21 NOTE — Progress Notes (Addendum)
Patient ID: [name] [name], female   DOB: September 04, 1940, 73 y.o.   MRN: [id number] Pt here today for evaluation of vaginal cysts.

## 2013-12-21 NOTE — Progress Notes (Addendum)
Patient ID: [name], female   DOB: Dec 21, 1940, 73 y.o.   MRN: [id number]   [facility] Visit  Patient name: [name] [name] MRN [id number]  Date of birth: 1940-12-29  CC & HPI:  [name] [name] is a 73 y.o. female presenting today for follow-up regarding vaginal cysts.  She is still feeling some discomfort when she wipes her vagina or has a bowel movement.    ROS:  All systems have been reviewed and are negative unless otherwise specified in the HPI.   Pertinent History Reviewed:   Reviewed: Significant for  Medical         Past Medical History  Diagnosis Date  . PONV (postoperative nausea and vomiting)   . Hypertension   . Depression   . Seasonal allergies   . Pneumonia   . Anxiety   . GERD (gastroesophageal reflux disease)   . H/O hiatal hernia   . Arthritis   . Anemia   . Obesity   . Sepsis 10/02/2013                              Surgical Hx:    Past Surgical History  Procedure Laterality Date  . Back surgery      lumbar x2 by Dr Arnoldo Morale  . Tonsillectomy    . Appendectomy    . Abdominal hysterectomy    . Cholecystectomy    . Joint replacement Right 1995    hip  . Joint replacement Left 2008    hip  . Medial partial knee replacement Left     Dr Ronnie Derby  . Shoulder arthroscopy Right   . Shoulder arthroscopy Right     x2  . Cardiac catheterization  1990's    "okay"  . Total shoulder arthroplasty Right 02/16/2013    Procedure: TOTAL SHOULDER ARTHROPLASTY;  Surgeon: Marybelle Killings, MD;  Location: [place];  Service: Orthopedics;  Laterality: Right;  Right Total Shoulder Arthroplasty, Cemented  . Cervical fusion    . Cataract extraction w/phaco Left 08/06/2013    Procedure: CATARACT EXTRACTION PHACO AND INTRAOCULAR LENS PLACEMENT LEFT EYE;  Surgeon: Tonny Branch, MD;  Location: [facility];  Service: Ophthalmology;  Laterality: Left;  CDE: 9.55  . Cataract extraction w/phaco Right 08/24/2013    Procedure: CATARACT EXTRACTION PHACO AND INTRAOCULAR LENS PLACEMENT (IOC);   Surgeon: Tonny Branch, MD;  Location: [facility];  Service: Ophthalmology;  Laterality: Right;  CDE:8.30  . Eye surgery    . Paraesophageal hernia repair  SEP 2010 DR. MCNATT  . Colonoscopy N/A 11/03/2013    Procedure: COLONOSCOPY;  Surgeon: Danie Binder, MD;  [facility];  Service: Endoscopy;  Laterality: N/A;  11:15  . Esophagogastroduodenoscopy N/A 11/03/2013    Procedure: ESOPHAGOGASTRODUODENOSCOPY (EGD);  Surgeon: Danie Binder, MD;  [facility];  Service: Endoscopy;  Laterality: N/A;   Medications: Reviewed & Updated - see associated section                      Current outpatient prescriptions:Calcium Citrate-Vitamin D (CITRACAL PETITES/VITAMIN D) 200-250 MG-UNIT TABS, Take 1 tablet by mouth daily., Disp: , Rfl: ;  diazepam (VALIUM) 5 MG tablet, Take 5 mg by mouth 3 (three) times daily as needed. For anxiety, Disp: , Rfl: ;  diphenhydrAMINE (BENADRYL) 25 MG tablet, Take 25 mg by mouth every 6 (six) hours as needed for allergies., Disp: , Rfl:  docusate  sodium (COLACE) 100 MG capsule, Take 100 mg by mouth at bedtime. , Disp: , Rfl: ;  HYDROcodone-acetaminophen (NORCO) 10-325 MG per tablet, Take 1 tablet by mouth every 6 (six) hours as needed for moderate pain., Disp: , Rfl: ;  iron polysaccharides (NIFEREX) 150 MG capsule, Take 1 capsule (150 mg total) by mouth 2 (two) times daily., Disp: 60 capsule, Rfl: 1 omeprazole (PRILOSEC) 20 MG capsule, Take 20 mg by mouth 2 (two) times daily., Disp: , Rfl: ;  ondansetron (ZOFRAN) 4 MG tablet, Take 1 tablet (4 mg total) by mouth every 6 (six) hours as needed for nausea., Disp: 20 tablet, Rfl: 0;  pantoprazole (PROTONIX) 40 MG tablet, Take 1 tablet (40 mg total) by mouth daily., Disp: 30 tablet, Rfl: 0 potassium chloride (K-DUR) 10 MEQ tablet, Take 10 mEq by mouth 3 (three) times daily with meals. , Disp: , Rfl: ;  sertraline (ZOLOFT) 100 MG tablet, Take 100 mg by mouth daily., Disp: , Rfl: ;  traZODone (DESYREL) 50 MG tablet, Take 50 mg by  mouth at bedtime., Disp: , Rfl:    Social History: Reviewed -  reports that she has never smoked. She has never used smokeless tobacco.  Objective Findings:  Vitals: Blood pressure 148/76, height 5\' 5"  (1.651 m), weight 222 lb (100.699 kg).  Physical Examination: General appearance - alert, well appearing, and in no distress and oriented to person, place, and time Pelvic - VULVA: normal appearing vulva with no masses, tenderness or lesions,  VAGINA: normal appearing vagina with normal color and discharge, no lesions, multiple sebaceous cysts along the right labia majora some of which are able to be reduced with expression by lateral pressure and several of which are hard and unable to be opened, CERVIX: absent UTERUS: absent  ADNEXA: absent  Assessment & Plan:   A: Vulvar sebaceous cysts  P:  1. Follow-up PRN, recurrence of symptomatic cysts   This chart was scribed for Jonnie Kind, MD by Donato Schultz, ED Scribe. This patient was seen in Room 2 and the patient's care was started at 11:21 AM.

## 2013-12-28 ENCOUNTER — Encounter (HOSPITAL_COMMUNITY): Payer: Self-pay | Admitting: Emergency Medicine

## 2013-12-28 ENCOUNTER — Emergency Department (HOSPITAL_COMMUNITY): Payer: Medicare Other

## 2013-12-28 ENCOUNTER — Inpatient Hospital Stay (HOSPITAL_COMMUNITY)
Admission: EM | Admit: 2013-12-28 | Discharge: 2013-12-31 | Disposition: A | Discharge status: Home / Self Care | Payer: Medicare Other | Attending: Family Medicine | Admitting: Family Medicine

## 2013-12-28 DIAGNOSIS — Z96652 Presence of left artificial knee joint: Secondary | ICD-10-CM | POA: Diagnosis present

## 2013-12-28 DIAGNOSIS — D638 Anemia in other chronic diseases classified elsewhere: Secondary | ICD-10-CM | POA: Diagnosis present

## 2013-12-28 DIAGNOSIS — J189 Pneumonia, unspecified organism: Secondary | ICD-10-CM | POA: Diagnosis present

## 2013-12-28 DIAGNOSIS — D649 Anemia, unspecified: Secondary | ICD-10-CM | POA: Diagnosis present

## 2013-12-28 DIAGNOSIS — E876 Hypokalemia: Secondary | ICD-10-CM | POA: Diagnosis present

## 2013-12-28 DIAGNOSIS — I10 Essential (primary) hypertension: Secondary | ICD-10-CM | POA: Diagnosis present

## 2013-12-28 DIAGNOSIS — R042 Hemoptysis: Secondary | ICD-10-CM

## 2013-12-28 DIAGNOSIS — G4733 Obstructive sleep apnea (adult) (pediatric): Secondary | ICD-10-CM | POA: Diagnosis present

## 2013-12-28 DIAGNOSIS — Z96643 Presence of artificial hip joint, bilateral: Secondary | ICD-10-CM | POA: Diagnosis present

## 2013-12-28 DIAGNOSIS — F419 Anxiety disorder, unspecified: Secondary | ICD-10-CM | POA: Diagnosis present

## 2013-12-28 DIAGNOSIS — R079 Chest pain, unspecified: Secondary | ICD-10-CM | POA: Diagnosis not present

## 2013-12-28 DIAGNOSIS — Z23 Encounter for immunization: Secondary | ICD-10-CM

## 2013-12-28 DIAGNOSIS — J9621 Acute and chronic respiratory failure with hypoxia: Secondary | ICD-10-CM | POA: Diagnosis not present

## 2013-12-28 DIAGNOSIS — R059 Cough, unspecified: Secondary | ICD-10-CM

## 2013-12-28 DIAGNOSIS — Y95 Nosocomial condition: Secondary | ICD-10-CM | POA: Diagnosis present

## 2013-12-28 DIAGNOSIS — R05 Cough: Secondary | ICD-10-CM

## 2013-12-28 DIAGNOSIS — Z6835 Body mass index (BMI) 35.0-35.9, adult: Secondary | ICD-10-CM | POA: Diagnosis not present

## 2013-12-28 DIAGNOSIS — J9601 Acute respiratory failure with hypoxia: Secondary | ICD-10-CM | POA: Diagnosis present

## 2013-12-28 DIAGNOSIS — K219 Gastro-esophageal reflux disease without esophagitis: Secondary | ICD-10-CM | POA: Diagnosis present

## 2013-12-28 DIAGNOSIS — R7881 Bacteremia: Secondary | ICD-10-CM | POA: Diagnosis present

## 2013-12-28 DIAGNOSIS — Z6838 Body mass index (BMI) 38.0-38.9, adult: Secondary | ICD-10-CM

## 2013-12-28 DIAGNOSIS — Z800 Family history of malignant neoplasm of digestive organs: Secondary | ICD-10-CM | POA: Diagnosis not present

## 2013-12-28 DIAGNOSIS — R4702 Dysphasia: Secondary | ICD-10-CM | POA: Diagnosis present

## 2013-12-28 DIAGNOSIS — R0602 Shortness of breath: Secondary | ICD-10-CM | POA: Diagnosis not present

## 2013-12-28 LAB — URINALYSIS, ROUTINE W REFLEX MICROSCOPIC
Bilirubin Urine: NEGATIVE
Glucose, UA: NEGATIVE mg/dL
Hgb urine dipstick: NEGATIVE
Ketones, ur: NEGATIVE mg/dL
Leukocytes, UA: NEGATIVE
Nitrite: NEGATIVE
Protein, ur: NEGATIVE mg/dL
Specific Gravity, Urine: 1.01 (ref 1.005–1.030)
Urobilinogen, UA: 0.2 mg/dL (ref 0.0–1.0)
pH: 5 (ref 5.0–8.0)

## 2013-12-28 LAB — CBC WITH DIFFERENTIAL/PLATELET
Basophils Absolute: 0 10*3/uL (ref 0.0–0.1)
Basophils Relative: 0 % (ref 0–1)
Eosinophils Absolute: 0 10*3/uL (ref 0.0–0.7)
Eosinophils Relative: 1 % (ref 0–5)
HCT: 31.7 % — ABNORMAL LOW (ref 36.0–46.0)
Hemoglobin: 9.4 g/dL — ABNORMAL LOW (ref 12.0–15.0)
Lymphocytes Relative: 4 % — ABNORMAL LOW (ref 12–46)
Lymphs Abs: 0.4 10*3/uL — ABNORMAL LOW (ref 0.7–4.0)
MCH: 23.8 pg — ABNORMAL LOW (ref 26.0–34.0)
MCHC: 29.7 g/dL — ABNORMAL LOW (ref 30.0–36.0)
MCV: 80.3 fL (ref 78.0–100.0)
Monocytes Absolute: 0.6 10*3/uL (ref 0.1–1.0)
Monocytes Relative: 7 % (ref 3–12)
Neutro Abs: 7.4 10*3/uL (ref 1.7–7.7)
Neutrophils Relative %: 88 % — ABNORMAL HIGH (ref 43–77)
Platelets: 162 10*3/uL (ref 150–400)
RBC: 3.95 MIL/uL (ref 3.87–5.11)
RDW: 16.9 % — ABNORMAL HIGH (ref 11.5–15.5)
WBC: 8.4 10*3/uL (ref 4.0–10.5)

## 2013-12-28 LAB — BASIC METABOLIC PANEL
Anion gap: 10 (ref 5–15)
BUN: 24 mg/dL — ABNORMAL HIGH (ref 6–23)
CO2: 26 mEq/L (ref 19–32)
Calcium: 8.4 mg/dL (ref 8.4–10.5)
Chloride: 103 mEq/L (ref 96–112)
Creatinine, Ser: 1.15 mg/dL — ABNORMAL HIGH (ref 0.50–1.10)
GFR calc Af Amer: 54 mL/min — ABNORMAL LOW (ref >90)
GFR calc non Af Amer: 46 mL/min — ABNORMAL LOW (ref >90)
Glucose, Bld: 98 mg/dL (ref 70–99)
Potassium: 3.6 mEq/L — ABNORMAL LOW (ref 3.7–5.3)
Sodium: 139 mEq/L (ref 137–147)

## 2013-12-28 LAB — PRO B NATRIURETIC PEPTIDE: Pro B Natriuretic peptide (BNP): 724 pg/mL — ABNORMAL HIGH (ref 0–125)

## 2013-12-28 LAB — MAGNESIUM: Magnesium: 1.4 mg/dL — ABNORMAL LOW (ref 1.5–2.5)

## 2013-12-28 LAB — TROPONIN I
Troponin I: <0.3 ng/mL (ref <0.30)
Troponin I: <0.3 ng/mL (ref <0.30)

## 2013-12-28 LAB — STREP PNEUMONIAE URINARY ANTIGEN: Strep Pneumo Urinary Antigen: NEGATIVE

## 2013-12-28 LAB — I-STAT CG4 LACTIC ACID, ED: Lactic Acid, Venous: 1.55 mmol/L (ref 0.5–2.2)

## 2013-12-28 MED ORDER — TRAZODONE HCL 50 MG PO TABS
50.0000 mg | ORAL_TABLET | Freq: Every day | ORAL | Status: DC
  Administered 2013-12-28 – 2013-12-30 (×3): 50 mg via ORAL
  Filled 2013-12-28 (×3): qty 1

## 2013-12-28 MED ORDER — DM-GUAIFENESIN ER 30-600 MG PO TB12: 1.0000 | ORAL_TABLET | Freq: Two times a day (BID) | ORAL | Status: DC | PRN

## 2013-12-28 MED ORDER — ACETAMINOPHEN 500 MG PO TABS
1000.0000 mg | ORAL_TABLET | Freq: Once | ORAL | Status: AC
  Administered 2013-12-28: 1000 mg via ORAL
  Filled 2013-12-28: qty 2

## 2013-12-28 MED ORDER — DOCUSATE SODIUM 100 MG PO CAPS
100.0000 mg | ORAL_CAPSULE | Freq: Every day | ORAL | Status: DC
  Administered 2013-12-28 – 2013-12-30 (×2): 100 mg via ORAL
  Filled 2013-12-28 (×3): qty 1

## 2013-12-28 MED ORDER — DEXTROSE 5 % IV SOLN
1.0000 g | Freq: Three times a day (TID) | INTRAVENOUS | Status: DC
  Filled 2013-12-28 (×4): qty 1

## 2013-12-28 MED ORDER — IPRATROPIUM-ALBUTEROL 0.5-2.5 (3) MG/3ML IN SOLN
3.0000 mL | Freq: Once | RESPIRATORY_TRACT | Status: AC
  Administered 2013-12-28: 3 mL via RESPIRATORY_TRACT
  Filled 2013-12-28: qty 3

## 2013-12-28 MED ORDER — PANTOPRAZOLE SODIUM 40 MG PO TBEC
40.0000 mg | DELAYED_RELEASE_TABLET | Freq: Every day | ORAL | Status: DC
  Administered 2013-12-28 – 2013-12-31 (×4): 40 mg via ORAL
  Filled 2013-12-28 (×4): qty 1

## 2013-12-28 MED ORDER — INFLUENZA VAC SPLIT QUAD 0.5 ML IM SUSY
0.5000 mL | PREFILLED_SYRINGE | INTRAMUSCULAR | Status: AC
  Administered 2013-12-29: 0.5 mL via INTRAMUSCULAR
  Filled 2013-12-28: qty 0.5

## 2013-12-28 MED ORDER — SODIUM CHLORIDE 0.9 % IJ SOLN
INTRAMUSCULAR | Status: AC
  Filled 2013-12-28: qty 45

## 2013-12-28 MED ORDER — SODIUM CHLORIDE 0.9 % IJ SOLN
3.0000 mL | Freq: Two times a day (BID) | INTRAMUSCULAR | Status: DC
  Administered 2013-12-28 – 2013-12-31 (×3): 3 mL via INTRAVENOUS

## 2013-12-28 MED ORDER — SERTRALINE HCL 50 MG PO TABS
100.0000 mg | ORAL_TABLET | Freq: Every day | ORAL | Status: DC
  Administered 2013-12-28 – 2013-12-31 (×4): 100 mg via ORAL
  Filled 2013-12-28 (×4): qty 2

## 2013-12-28 MED ORDER — PIPERACILLIN-TAZOBACTAM 3.375 G IVPB
3.3750 g | Freq: Three times a day (TID) | INTRAVENOUS | Status: DC
  Filled 2013-12-28 (×3): qty 50

## 2013-12-28 MED ORDER — POTASSIUM CHLORIDE CRYS ER 20 MEQ PO TBCR
20.0000 meq | EXTENDED_RELEASE_TABLET | Freq: Once | ORAL | Status: AC
  Administered 2013-12-28: 20 meq via ORAL
  Filled 2013-12-28: qty 1

## 2013-12-28 MED ORDER — ACETAMINOPHEN 325 MG PO TABS
650.0000 mg | ORAL_TABLET | Freq: Four times a day (QID) | ORAL | Status: DC | PRN
  Administered 2013-12-28 – 2013-12-31 (×6): 650 mg via ORAL
  Filled 2013-12-28 (×6): qty 2

## 2013-12-28 MED ORDER — VANCOMYCIN HCL IN DEXTROSE 750-5 MG/150ML-% IV SOLN
750.0000 mg | Freq: Two times a day (BID) | INTRAVENOUS | Status: DC
  Administered 2013-12-28 – 2013-12-30 (×4): 750 mg via INTRAVENOUS
  Filled 2013-12-28 (×6): qty 150

## 2013-12-28 MED ORDER — MAGNESIUM SULFATE 40 MG/ML IJ SOLN
4.0000 g | Freq: Once | INTRAMUSCULAR | Status: AC
  Administered 2013-12-28: 4 g via INTRAVENOUS
  Filled 2013-12-28: qty 100

## 2013-12-28 MED ORDER — INFLUENZA VAC SPLIT QUAD 0.5 ML IM SUSY
0.5000 mL | PREFILLED_SYRINGE | Freq: Once | INTRAMUSCULAR | Status: DC
  Filled 2013-12-28: qty 0.5

## 2013-12-28 MED ORDER — DEXTROSE 5 % IV SOLN
1.0000 g | Freq: Three times a day (TID) | INTRAVENOUS | Status: DC
  Filled 2013-12-28 (×3): qty 1

## 2013-12-28 MED ORDER — POLYSACCHARIDE IRON COMPLEX 150 MG PO CAPS
150.0000 mg | ORAL_CAPSULE | Freq: Two times a day (BID) | ORAL | Status: DC
  Administered 2013-12-28 – 2013-12-31 (×7): 150 mg via ORAL
  Filled 2013-12-28 (×7): qty 1

## 2013-12-28 MED ORDER — IPRATROPIUM-ALBUTEROL 0.5-2.5 (3) MG/3ML IN SOLN
3.0000 mL | RESPIRATORY_TRACT | Status: DC
  Administered 2013-12-28 – 2013-12-30 (×10): 3 mL via RESPIRATORY_TRACT
  Filled 2013-12-28 (×10): qty 3

## 2013-12-28 MED ORDER — ACETAMINOPHEN 650 MG RE SUPP: 650.0000 mg | Freq: Four times a day (QID) | RECTAL | Status: DC | PRN

## 2013-12-28 MED ORDER — IOHEXOL 350 MG/ML SOLN
100.0000 mL | Freq: Once | INTRAVENOUS | Status: AC | PRN
  Administered 2013-12-28: 100 mL via INTRAVENOUS

## 2013-12-28 MED ORDER — DIAZEPAM 2 MG PO TABS
2.0000 mg | ORAL_TABLET | Freq: Three times a day (TID) | ORAL | Status: DC | PRN
  Administered 2013-12-29 – 2013-12-30 (×2): 2 mg via ORAL
  Filled 2013-12-28 (×2): qty 1

## 2013-12-28 MED ORDER — ENOXAPARIN SODIUM 40 MG/0.4ML ~~LOC~~ SOLN
40.0000 mg | SUBCUTANEOUS | Status: DC
  Administered 2013-12-28 – 2013-12-30 (×3): 40 mg via SUBCUTANEOUS
  Filled 2013-12-28 (×3): qty 0.4

## 2013-12-28 MED ORDER — SODIUM CHLORIDE 0.9 % IV SOLN
2000.0000 mg | Freq: Once | INTRAVENOUS | Status: AC
  Administered 2013-12-28: 2000 mg via INTRAVENOUS
  Filled 2013-12-28: qty 2000

## 2013-12-28 MED ORDER — SODIUM CHLORIDE 0.9 % IV BOLUS (SEPSIS)
1000.0000 mL | Freq: Once | INTRAVENOUS | Status: AC
  Administered 2013-12-28: 1000 mL via INTRAVENOUS

## 2013-12-28 MED ORDER — DEXTROSE 5 % IV SOLN
1.0000 g | Freq: Three times a day (TID) | INTRAVENOUS | Status: DC
  Administered 2013-12-28 – 2013-12-30 (×6): 1 g via INTRAVENOUS
  Filled 2013-12-28 (×9): qty 1

## 2013-12-28 MED ORDER — POTASSIUM CHLORIDE CRYS ER 20 MEQ PO TBCR
40.0000 meq | EXTENDED_RELEASE_TABLET | Freq: Once | ORAL | Status: AC
  Administered 2013-12-28: 40 meq via ORAL
  Filled 2013-12-28: qty 2

## 2013-12-28 MED ORDER — POTASSIUM CHLORIDE CRYS ER 10 MEQ PO TBCR
10.0000 meq | EXTENDED_RELEASE_TABLET | Freq: Three times a day (TID) | ORAL | Status: DC
  Administered 2013-12-28 – 2013-12-31 (×9): 10 meq via ORAL
  Filled 2013-12-28 (×9): qty 1

## 2013-12-28 MED ORDER — ONDANSETRON HCL 4 MG PO TABS: 4.0000 mg | ORAL_TABLET | Freq: Four times a day (QID) | ORAL | Status: DC | PRN

## 2013-12-28 MED ORDER — SODIUM CHLORIDE 0.9 % IV SOLN
INTRAVENOUS | Status: DC
  Administered 2013-12-28 – 2013-12-31 (×3): via INTRAVENOUS

## 2013-12-28 MED ORDER — ALUM & MAG HYDROXIDE-SIMETH 200-200-20 MG/5ML PO SUSP: 30.0000 mL | Freq: Four times a day (QID) | ORAL | Status: DC | PRN

## 2013-12-28 MED ORDER — DEXTROSE 5 % IV SOLN
2.0000 g | Freq: Once | INTRAVENOUS | Status: AC
  Administered 2013-12-28: 2 g via INTRAVENOUS
  Filled 2013-12-28: qty 2

## 2013-12-28 MED ORDER — ONDANSETRON HCL 4 MG/2ML IJ SOLN: 4.0000 mg | Freq: Four times a day (QID) | INTRAMUSCULAR | Status: DC | PRN

## 2013-12-28 NOTE — ED Notes (Signed)
Pt c/o cough and sob since Friday.  Reports started coughing up blood yesterday.  C/O chest pain that is worse with coughing.

## 2013-12-28 NOTE — H&P (Signed)
Patient seen and examined. Note Reviewed.  Patient was admitted to the hospital for progressive shortness of breath, cough and hemoptysis. She was found to have a significant right-sided pneumonia in her lower, middle and upper lobe. She's been started on broad-spectrum antibiotics. CT chest was negative for pulmonary embolus. Plan to continue intravenous antibiotics for now.  Cutler Sunday

## 2013-12-28 NOTE — H&P (Signed)
[facility] History and Physical  PAYTIN RAMAKRISHNAN WUJ:[id number] DOB: 01-11-1941 DOA: 12/28/2013  Referring physician:  PCP: [name], MD   Chief Complaint: shortness of breath  HPI: [name] [name] is a pleasant 73 y.o. female with a past medical history that includes hypertension, GERD, anemia and pneumonia for which she was hospitalized 3 months ago presents to the emergency department with chief complaint of shortness of breath with hemoptysis. Initial evaluation in the emergency department reveals right multilobular pneumonia.  Patient reports that 3 days ago she was doing yard work for most of the day and at the end of that day she developed some chest "soreness". He noted that the soreness is worse with movement and attributed it to her physical activity. 2 days ago she developed a cough and some shortness of breath which worsened over time. Yesterday she developed hemoptysis. She denies fever chills headache dizziness syncope or near syncope. She denies abdominal pain diarrhea dysuria hematuria frequency or urgency. She does say that she has felt slightly nauseated but there has been no vomiting. She denies any recent travel or sick contacts.  Workup in the emergency department includes complete blood count significant for creatinine of 1.15, potassium 3.6 otherwise unremarkable, complete blood count significant for hemoglobin of 9.4 otherwise unremarkable, proBNP 724, lactic acid 1.55 and initial troponin negative. CT NGO of the chest  yields right upper, middle and lower lobe pneumonia. Adequate opacification of the central pulmonary arteries, but the subsegmental branches are suboptimally opacified. No central or segmental pulmonary embolus. 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  Her oral temperature 99.5 she is hemodynamically stable and not hypoxic. In the emergency department she is given 1 L  of normal saline intravenously 1 DuoNeb cefepime and vancomycin.   Review of Systems:  10 point review of systems completed and all systems are negative except as indicated in the history of present illness   Past Medical History  Diagnosis Date  . PONV (postoperative nausea and vomiting)   . Hypertension   . Depression   . Seasonal allergies   . Pneumonia   . Anxiety   . GERD (gastroesophageal reflux disease)   . H/O hiatal hernia   . Arthritis   . Anemia   . Obesity   . Sepsis 10/02/2013   Past Surgical History  Procedure Laterality Date  . Back surgery      lumbar x2 by Dr Arnoldo Morale  . Tonsillectomy    . Appendectomy    . Abdominal hysterectomy    . Cholecystectomy    . Joint replacement Right 1995    hip  . Joint replacement Left 2008    hip  . Medial partial knee replacement Left     Dr Ronnie Derby  . Shoulder arthroscopy Right   . Shoulder arthroscopy Right     x2  . Cardiac catheterization  1990's    "okay"  . Total shoulder arthroplasty Right 02/16/2013    Procedure: TOTAL SHOULDER ARTHROPLASTY;  Surgeon: Marybelle Killings, MD;  Location: [place];  Service: Orthopedics;  Laterality: Right;  Right Total Shoulder Arthroplasty, Cemented  . Cervical fusion    . Cataract extraction w/phaco Left 08/06/2013    Procedure: CATARACT EXTRACTION PHACO AND INTRAOCULAR LENS PLACEMENT LEFT EYE;  Surgeon: Tonny Branch, MD;  Location: [facility];  Service: Ophthalmology;  Laterality: Left;  CDE: 9.55  . Cataract extraction w/phaco Right 08/24/2013  Procedure: CATARACT EXTRACTION PHACO AND INTRAOCULAR LENS PLACEMENT (IOC);  Surgeon: Tonny Branch, MD;  Location: [facility];  Service: Ophthalmology;  Laterality: Right;  CDE:8.30  . Eye surgery    . Paraesophageal hernia repair  SEP 2010 DR. MCNATT  . Colonoscopy N/A 11/03/2013    Procedure: COLONOSCOPY;  Surgeon: Danie Binder, MD;  [facility];  Service: Endoscopy;  Laterality: N/A;  11:15  . Esophagogastroduodenoscopy N/A 11/03/2013     Procedure: ESOPHAGOGASTRODUODENOSCOPY (EGD);  Surgeon: Danie Binder, MD;  [facility];  Service: Endoscopy;  Laterality: N/A;   Social History:  reports that she has never smoked. She has never used smokeless tobacco. She reports that she does not drink alcohol or use illicit drugs.  she is a retired Systems analyst. She lives alone and is independent with ADLs and has family nearby Allergies  Allergen Reactions  . Novocain [Procaine Hcl]     Unknown-passed out with novocaine injected for dental surgery.  . Other Hives    EKG pads - need to use pediatric pads  . Latex Rash  . Penicillins Rash  . Sulfa Antibiotics Rash    Family History  Problem Relation Age of Onset  . Colon cancer Brother     IN HIS 60s-METASTATIC  . Colon polyps Paternal Grandfather      Prior to Admission medications   Medication Sig Start Date End Date Taking? Authorizing Provider  Calcium Citrate-Vitamin D (CITRACAL PETITES/VITAMIN D) 200-250 MG-UNIT TABS Take 1 tablet by mouth daily.   Yes Historical Provider, MD  diazepam (VALIUM) 5 MG tablet Take 5 mg by mouth 3 (three) times daily as needed. For anxiety   Yes Historical Provider, MD  diphenhydrAMINE (BENADRYL) 25 MG tablet Take 25 mg by mouth every 6 (six) hours as needed for allergies.   Yes Historical Provider, MD  docusate sodium (COLACE) 100 MG capsule Take 100 mg by mouth at bedtime.    Yes Historical Provider, MD  HYDROcodone-acetaminophen (NORCO) 10-325 MG per tablet Take 1 tablet by mouth every 6 (six) hours as needed for moderate pain.   Yes Historical Provider, MD  iron polysaccharides (NIFEREX) 150 MG capsule Take 1 capsule (150 mg total) by mouth 2 (two) times daily. 10/03/13  Yes Kathie Dike, MD  omeprazole (PRILOSEC) 20 MG capsule Take 20 mg by mouth 2 (two) times daily.   Yes Historical Provider, MD  ondansetron (ZOFRAN) 4 MG tablet Take 1 tablet (4 mg total) by mouth every 6 (six) hours as needed for nausea. 10/03/13  Yes Kathie Dike, MD  pantoprazole (PROTONIX) 40 MG tablet Take 1 tablet (40 mg total) by mouth daily. 10/03/13  Yes Kathie Dike, MD  potassium chloride (K-DUR) 10 MEQ tablet Take 10 mEq by mouth 3 (three) times daily with meals.    Yes Historical Provider, MD  sertraline (ZOLOFT) 100 MG tablet Take 100 mg by mouth daily.   Yes Historical Provider, MD  traZODone (DESYREL) 50 MG tablet Take 50 mg by mouth at bedtime.   Yes Historical Provider, MD   Physical Exam: Filed Vitals:   12/28/13 0825 12/28/13 0832 12/28/13 0906 12/28/13 1011  BP:   129/57 117/67  Pulse:   96 94  Temp:  99.3 F (37.4 C) 99.4 F (37.4 C)   TempSrc:  Rectal Oral   Resp:   22 26  Height:      Weight:      SpO2: 94%  96% 96%    Wt Readings from  Last 3 Encounters:  12/28/13 96.163 kg (212 lb)  12/21/13 100.699 kg (222 lb)  11/25/13 100.699 kg (222 lb)    General:  Appears calm and comfortable, eating breakfast with enthusiasm  Eyes: PERRL, normal lids, irises & conjunctiva ENT: grossly normal hearing, lips & tongue Neck: no LAD, masses or thyromegaly Cardiovascular: RRR, no m/r/g. No LE edema.  Respiratory: Normal effort with conversation and while eating. Breath sounds are diminished throughout and coarse. Fine crackles noted bilateral bases. Abdomen: soft, ntnd positive bowel sounds throughout  Skin: no rash or induration seen on limited exam Musculoskeletal: grossly normal tone BUE/BLE Psychiatric: grossly normal mood and affect, speech fluent and appropriate Neurologic: grossly non-focal. Speech clear facial symmetry           Labs on Admission:  Basic Metabolic Panel:  Recent Labs Lab 12/28/13 0725  NA 139  K 3.6*  CL 103  CO2 26  GLUCOSE 98  BUN 24*  CREATININE 1.15*  CALCIUM 8.4   Liver Function Tests: No results found for this basename: AST, ALT, ALKPHOS, BILITOT, PROT, ALBUMIN,  in the last 168 hours No results found for this basename: LIPASE, AMYLASE,  in the last 168 hours No results  found for this basename: AMMONIA,  in the last 168 hours CBC:  Recent Labs Lab 12/28/13 0725  WBC 8.4  NEUTROABS 7.4  HGB 9.4*  HCT 31.7*  MCV 80.3  PLT 162   Cardiac Enzymes:  Recent Labs Lab 12/28/13 0725  TROPONINI <0.30    BNP (last 3 results)  Recent Labs  12/28/13 0725  PROBNP 724.0*   CBG: No results found for this basename: GLUCAP,  in the last 168 hours  Radiological Exams on Admission: Ct Angio Chest W/cm &/or Wo Cm  12/28/2013   CLINICAL DATA:  Hemoptysis since yesterday, cough, chest pain  EXAM: CT ANGIOGRAPHY CHEST WITH CONTRAST  TECHNIQUE: Multidetector CT imaging of the chest was performed using the standard protocol during bolus administration of intravenous contrast. Multiplanar CT image reconstructions and MIPs were obtained to evaluate the vascular anatomy.  CONTRAST:  157mL OMNIPAQUE IOHEXOL 350 MG/ML SOLN  COMPARISON:  None.  FINDINGS: There is adequate opacification of the central pulmonary arteries, but the subsegmental branches are suboptimally opacified. There is no central or segmental pulmonary embolus. The main pulmonary artery, right main pulmonary artery and left main pulmonary arteries are normal in size. The heart size is normal. There is no pericardial effusion.  There is right upper lobe, right middle lobe and right lower lobe airspace disease most consistent with multi lobar pneumonia. There is no left lung consolidation. There is no pleural effusion or pneumothorax.  There is no axillary, hilar, or mediastinal adenopathy.  There is no lytic or blastic osseous lesion. 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  There are multiple surgical clips in the epigastric region from prior gastric surgery. There is a small hiatal hernia.  Review of the MIP images confirms the above findings.  IMPRESSION: 1. Right upper, middle and lower lobe pneumonia. 2. Adequate opacification  of the central pulmonary arteries, but the subsegmental branches are suboptimally opacified. No central or segmental pulmonary embolus. 3. 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   Electronically Signed   By: Kathreen Devoid   On: 12/28/2013 09:12   Dg Chest Portable 1 View  12/28/2013  CLINICAL DATA:  Shortness of breath, hemoptysis, initial evaluation, began 11 o'clock last night, history of pneumonia this year  EXAM: PORTABLE CHEST - 1 VIEW  COMPARISON:  10/01/2013  FINDINGS: Mild to moderate cardiac enlargement similar to prior study. Left lung is clear.  There is diffuse alveolar opacification throughout the right lung, sparing only the right apex. There is no evidence of pleural effusion .  IMPRESSION: Diffuse alveolar opacity on the right concerning for multilobar pneumonia.   Electronically Signed   By: Skipper Cliche M.D.   On: 12/28/2013 07:45    EKG:   Assessment/Plan Principal Problem:   Pneumonia: Health care associated. 3 months ago hospitalized with right lower lobe pneumonia. Today multilobular. Admit to telemetry. Blood cultures drawn in the emergency department. Will obtain strep pneumo urine antigen with Legionella urine antigen. Provide vancomycin and Zosyn per pharmacy. Sputum culture as available. Nebulizer and oxygen as needed. Monitor oxygen saturation level. Patient with history of dysphasia. Will request speech therapy swallow eval Active Problems:    Cough with hemoptysis: Likely related to above. CT negative for PE she is not hypoxic. Provide supportive therapy. Mucolytic's. Sputum culture. Monitor  Hypokalemia: Mild. She is on potassium supplements at home. I will continue this and give her 1 extra dose. Check a mag level repeat in the morning  Anemia: Chronic. Current hemoglobin 9.4 chart review indicates this is close to her baseline. Monitor    Hypertension: Controlled. We'll continue  her home medications monitor closely    GERD (gastroesophageal reflux disease): Appears stable at baseline continue home medications    Anxiety: Appears stable at baseline     Morbid obesity: BMI 35.4     Code Status: full DVT Prophylaxis: Family Communication: spoke with niece elizabeth on phone Disposition Plan: home when ready  Time spent: 61 minutes  [facility] Pager [phone]

## 2013-12-28 NOTE — ED Notes (Signed)
Pt c/o headache, chest pain, and right arm pain.  Notified Dr. Leonides Schanz and tylenol given.  Pt requesting something to eat.  Dr. Leonides Schanz waiting for ct report before giving pt something to eat.  PT verbalized understanding.

## 2013-12-28 NOTE — Progress Notes (Signed)
ANTIBIOTIC CONSULT NOTE - INITIAL  Pharmacy Consult for Vancomycin and Cefepime Indication: pneumonia  Allergies  Allergen Reactions  . Novocain [Procaine Hcl]     Unknown-passed out with novocaine injected for dental surgery.  . Other Hives    EKG pads - need to use pediatric pads  . Latex Rash  . Penicillins Rash  . Sulfa Antibiotics Rash    Patient Measurements: Height: 5\' 5"  (165.1 cm) Weight: 212 lb (96.163 kg) IBW/kg (Calculated) : 57  Vital Signs: Temp: 99.8 F (37.7 C) (10/26 1038) Temp Source: Oral (10/26 1038) BP: 144/57 mmHg (10/26 1038) Pulse Rate: 102 (10/26 1038) Intake/Output from previous day:   Intake/Output from this shift:    Labs:  Recent Labs  12/28/13 0725  WBC 8.4  HGB 9.4*  PLT 162  CREATININE 1.15*   Estimated Creatinine Clearance: 50.7 ml/min (by C-G formula based on Cr of 1.15). No results found for this basename: VANCOTROUGH, VANCOPEAK, VANCORANDOM, [place], GENTPEAK, GENTRANDOM, [place], TOBRAPEAK, TOBRARND, AMIKACINPEAK, AMIKACINTROU, AMIKACIN,  in the last 72 hours   Microbiology: Recent Results (from the past 720 hour(s))  CULTURE, BLOOD (ROUTINE X 2)     Status: None   Collection Time    12/28/13  7:25 AM      Result Value Ref Range Status   Specimen Description Blood   Final   Special Requests NONE   Final   Culture NO GROWTH <24 HRS   Final   Report Status PENDING   Incomplete  CULTURE, BLOOD (ROUTINE X 2)     Status: None   Collection Time    12/28/13  8:05 AM      Result Value Ref Range Status   Specimen Description Blood   Final   Special Requests NONE   Final   Culture NO GROWTH <24 HRS   Final   Report Status PENDING   Incomplete    Medical History: Past Medical History  Diagnosis Date  . PONV (postoperative nausea and vomiting)   . Hypertension   . Depression   . Seasonal allergies   . Pneumonia   . Anxiety   . GERD (gastroesophageal reflux disease)   . H/O hiatal hernia   . Arthritis   .  Anemia   . Obesity   . Sepsis 10/02/2013   Anti-infectives   Start     Dose/Rate Route Frequency Ordered Stop   12/28/13 2100  vancomycin (VANCOCIN) IVPB 750 mg/150 ml premix     750 mg 150 mL/hr over 60 Minutes Intravenous Every 12 hours 12/28/13 1102     12/28/13 1600  ceFEPIme (MAXIPIME) 1 g in dextrose 5 % 50 mL IVPB     1 g 100 mL/hr over 30 Minutes Intravenous Every 8 hours 12/28/13 1100     12/28/13 0900  vancomycin (VANCOCIN) 2,000 mg in sodium chloride 0.9 % 500 mL IVPB     2,000 mg 250 mL/hr over 120 Minutes Intravenous  Once 12/28/13 0810     12/28/13 0815  ceFEPIme (MAXIPIME) 2 g in dextrose 5 % 50 mL IVPB     2 g 100 mL/hr over 30 Minutes Intravenous  Once 12/28/13 0808 12/28/13 3382     Assessment: 73yo female admitted with SOB, hemoptysis, and pneumonia.  SCr is elevated slightly on admission.  Normalized ClCr 50-55  Goal of Therapy:  Vancomycin trough level 15-20 mcg/ml  Plan:  Vancomycin 2000mg  IV now x 1 (loading dose) then Vancomycin 750mg  IV q12hrs Check trough at steady state Cefepime 2gm  IV x 1 on admission then 1gm IV q8hrs Monitor labs, renal fxn, and cultures  [place] Crane, Mannie Ohlin A 12/28/2013,11:03 AM

## 2013-12-28 NOTE — ED Notes (Signed)
Patient from home complaining of cough x 4 days, with bloody sputum starting yesterday. Also complaining of chest pain with deep breathing.

## 2013-12-28 NOTE — ED Notes (Signed)
Assisted pt to bsc and back to bed.

## 2013-12-28 NOTE — ED Provider Notes (Signed)
This chart was scribed for [place], DO by Rayfield Citizen, ED Scribe. This patient was seen in room APA06/APA06 and the patient's care was started at 7:40 AM.   TIME SEEN: 7:40 AM  CHIEF COMPLAINT: SOB with hemoptysis  HPI:   HPI Comments: [name] [name] is a 73 y.o. female who presents to the Emergency Department complaining of 4 days of cough. She reports associated chest pain with deep breathing and hemoptysis. She reports a bout of physical activity (gardening) 4 days ago, after which she noted soreness and chest pain. She notes a productive cough (yellow sputum). She denies any recent travel or sick contacts other than her niece who she reports has a cold.  She denies any DVT or PE; she is not on any blood thinners. She denies asthma, COPD, or previous experience with TB. She has not been in prison or used IV drugs. She is not diabetic.   No h/o HIV.  She is a nonsmoker.   ROS: See HPI Constitutional: no fever  Eyes: no drainage  ENT: no runny nose   Cardiovascular:  no chest pain  Resp: no SOB  GI: no vomiting GU: no dysuria Integumentary: no rash  Allergy: no hives  Musculoskeletal: no leg swelling  Neurological: no slurred speech ROS otherwise negative  PAST MEDICAL HISTORY/PAST SURGICAL HISTORY:  Past Medical History  Diagnosis Date  . PONV (postoperative nausea and vomiting)   . Hypertension   . Depression   . Seasonal allergies   . Pneumonia   . Anxiety   . GERD (gastroesophageal reflux disease)   . H/O hiatal hernia   . Arthritis   . Anemia   . Obesity   . Sepsis 10/02/2013    MEDICATIONS:  Prior to Admission medications   Medication Sig Start Date End Date Taking? Authorizing Provider  Calcium Citrate-Vitamin D (CITRACAL PETITES/VITAMIN D) 200-250 MG-UNIT TABS Take 1 tablet by mouth daily.    Historical Provider, MD  diazepam (VALIUM) 5 MG tablet Take 5 mg by mouth 3 (three) times daily as needed. For anxiety    Historical Provider, MD   diphenhydrAMINE (BENADRYL) 25 MG tablet Take 25 mg by mouth every 6 (six) hours as needed for allergies.    Historical Provider, MD  docusate sodium (COLACE) 100 MG capsule Take 100 mg by mouth at bedtime.     Historical Provider, MD  HYDROcodone-acetaminophen (NORCO) 10-325 MG per tablet Take 1 tablet by mouth every 6 (six) hours as needed for moderate pain.    Historical Provider, MD  iron polysaccharides (NIFEREX) 150 MG capsule Take 1 capsule (150 mg total) by mouth 2 (two) times daily. 10/03/13   Kathie Dike, MD  omeprazole (PRILOSEC) 20 MG capsule Take 20 mg by mouth 2 (two) times daily.    Historical Provider, MD  ondansetron (ZOFRAN) 4 MG tablet Take 1 tablet (4 mg total) by mouth every 6 (six) hours as needed for nausea. 10/03/13   Kathie Dike, MD  pantoprazole (PROTONIX) 40 MG tablet Take 1 tablet (40 mg total) by mouth daily. 10/03/13   Kathie Dike, MD  potassium chloride (K-DUR) 10 MEQ tablet Take 10 mEq by mouth 3 (three) times daily with meals.     Historical Provider, MD  sertraline (ZOLOFT) 100 MG tablet Take 100 mg by mouth daily.    Historical Provider, MD  traZODone (DESYREL) 50 MG tablet Take 50 mg by mouth at bedtime.    Historical Provider, MD    ALLERGIES:  Allergies  Allergen Reactions  . Novocain [Procaine Hcl]     Unknown-passed out with novocaine injected for dental surgery.  . Other Hives    EKG pads - need to use pediatric pads  . Latex Rash  . Penicillins Rash  . Sulfa Antibiotics Rash    SOCIAL HISTORY:  History  Substance Use Topics  . Smoking status: Never Smoker   . Smokeless tobacco: Never Used  . Alcohol Use: No    FAMILY HISTORY: Family History  Problem Relation Age of Onset  . Colon cancer Brother     IN HIS 60s-METASTATIC  . Colon polyps Paternal Grandfather     EXAM: BP 156/69  Pulse 108  Temp(Src) 99.5 F (37.5 C) (Oral)  Resp 24  Ht 5\' 5"  (1.651 m)  Wt 212 lb (96.163 kg)  BMI 35.28 kg/m2  SpO2 92% CONSTITUTIONAL: Alert  and oriented and responds appropriately to questions. Appears uncomfortable but is non-toxic and in no distress HEAD: Normocephalic EYES: Conjunctivae clear, PERRL ENT: normal nose; no rhinorrhea; moist mucous membranes; pharynx without lesions noted NECK: Supple, no meningismus, no LAD  CARD: RRR; S1 and S2 appreciated; no murmurs, no clicks, no rubs, no gallops RESP: Diffuse rhonchus breath sounds on the right; mildly tachypneic, coughing small amounts of blood frequently during examination; no respiratory distress, mild hypoxia; no increased work of breathing ABD/GI: Normal bowel sounds; non-distended; soft, non-tender, no rebound, no guarding BACK:  The back appears normal and is non-tender to palpation, there is no CVA tenderness EXT: Normal ROM in all joints; non-tender to palpation; no edema; normal capillary refill; no cyanosis    SKIN: Normal color for age and race; warm NEURO: Moves all extremities equally PSYCH: The patient's mood and manner are appropriate. Grooming and personal hygiene are appropriate.  MEDICAL DECISION MAKING: Patient here with hemostasis and cough. Suspect pneumonia. Less likely tuberculosis or pulmonary embolism.  Given frequency of patients hemoptysis however will obtain a CT upper chest to evaluate for alveolar hemorrhage.  ED PROGRESS: patient's labs are unremarkable.  Chest x-ray shows multi-liver right-sided pneumonia. Will treat for healthcare associated pneumonia given she was hospitalized less than three months ago. CT scan pending. She is still stable.   Patient CT scan shows no pulmonary embolism or alveolar hemorrhage. Confirms multi-liver pneumonia. Discussed with hospitalist for mission.     EKG Interpretation  Date/Time:  Monday December 28 2013 07:13:42 EDT Ventricular Rate:  100 PR Interval:  129 QRS Duration: 90 QT Interval:  336 QTC Calculation: 433 R Axis:   -8 Text Interpretation:  Sinus tachycardia Probable left atrial enlargement  Abnormal R-wave progression, early transition Left ventricular hypertrophy No significant change since last tracing Confirmed by WARD,  DO, KRISTEN ([id number]) on 12/28/2013 8:44:00 AM        CRITICAL CARE Performed by: Nyra Jabs   Total critical care time: 40 minutes  Critical care time was exclusive of separately billable procedures and treating other patients.  Critical care was necessary to treat or prevent imminent or life-threatening deterioration.  Critical care was time spent personally by me on the following activities: development of treatment plan with patient and/or surrogate as well as nursing, discussions with consultants, evaluation of patient's response to treatment, examination of patient, obtaining history from patient or surrogate, ordering and performing treatments and interventions, ordering and review of laboratory studies, ordering and review of radiographic studies, pulse oximetry and re-evaluation of patient's condition.    I personally performed the services described in this documentation, which  was scribed in my presence. The recorded information has been reviewed and is accurate.   [place], DO 12/31/13 1250

## 2013-12-28 NOTE — ED Notes (Signed)
Diet ordered 

## 2013-12-28 NOTE — Progress Notes (Signed)
Patient refusing SCD's. Stated she has received blisters from them in the past. Dyanne Carrel NP paged to notify.

## 2013-12-29 DIAGNOSIS — K219 Gastro-esophageal reflux disease without esophagitis: Secondary | ICD-10-CM

## 2013-12-29 DIAGNOSIS — J9601 Acute respiratory failure with hypoxia: Secondary | ICD-10-CM

## 2013-12-29 DIAGNOSIS — R7881 Bacteremia: Secondary | ICD-10-CM | POA: Diagnosis present

## 2013-12-29 DIAGNOSIS — J189 Pneumonia, unspecified organism: Secondary | ICD-10-CM | POA: Diagnosis not present

## 2013-12-29 LAB — MAGNESIUM: Magnesium: 2.4 mg/dL (ref 1.5–2.5)

## 2013-12-29 LAB — CBC
HCT: 28.7 % — ABNORMAL LOW (ref 36.0–46.0)
Hemoglobin: 8.4 g/dL — ABNORMAL LOW (ref 12.0–15.0)
MCH: 23.3 pg — ABNORMAL LOW (ref 26.0–34.0)
MCHC: 29.3 g/dL — ABNORMAL LOW (ref 30.0–36.0)
MCV: 79.5 fL (ref 78.0–100.0)
Platelets: 170 10*3/uL (ref 150–400)
RBC: 3.61 MIL/uL — ABNORMAL LOW (ref 3.87–5.11)
RDW: 17.4 % — ABNORMAL HIGH (ref 11.5–15.5)
WBC: 9.7 10*3/uL (ref 4.0–10.5)

## 2013-12-29 LAB — BASIC METABOLIC PANEL
Anion gap: 10 (ref 5–15)
BUN: 18 mg/dL (ref 6–23)
CO2: 26 mEq/L (ref 19–32)
Calcium: 8.3 mg/dL — ABNORMAL LOW (ref 8.4–10.5)
Chloride: 105 mEq/L (ref 96–112)
Creatinine, Ser: 1.12 mg/dL — ABNORMAL HIGH (ref 0.50–1.10)
GFR calc Af Amer: 55 mL/min — ABNORMAL LOW (ref >90)
GFR calc non Af Amer: 48 mL/min — ABNORMAL LOW (ref >90)
Glucose, Bld: 104 mg/dL — ABNORMAL HIGH (ref 70–99)
Potassium: 4.2 mEq/L (ref 3.7–5.3)
Sodium: 141 mEq/L (ref 137–147)

## 2013-12-29 LAB — LEGIONELLA ANTIGEN, URINE

## 2013-12-29 LAB — CLOSTRIDIUM DIFFICILE BY PCR: Toxigenic C. Difficile by PCR: NEGATIVE

## 2013-12-29 NOTE — Evaluation (Signed)
Clinical/Bedside Swallow Evaluation Patient Details  Name: [name] [name] MRN: [id number] Date of Birth: April 02, 1940  Today's Date: 12/29/2013 Time: 1443-1540 SLP Time Calculation (min): 28 min  Past Medical History:  Past Medical History  Diagnosis Date  . PONV (postoperative nausea and vomiting)   . Hypertension   . Depression   . Seasonal allergies   . Pneumonia   . Anxiety   . GERD (gastroesophageal reflux disease)   . H/O hiatal hernia   . Arthritis   . Anemia   . Obesity   . Sepsis 10/02/2013   Past Surgical History:  Past Surgical History  Procedure Laterality Date  . Back surgery      lumbar x2 by Dr Arnoldo Morale  . Tonsillectomy    . Appendectomy    . Abdominal hysterectomy    . Cholecystectomy    . Joint replacement Right 1995    hip  . Joint replacement Left 2008    hip  . Medial partial knee replacement Left     Dr Ronnie Derby  . Shoulder arthroscopy Right   . Shoulder arthroscopy Right     x2  . Cardiac catheterization  1990's    "okay"  . Total shoulder arthroplasty Right 02/16/2013    Procedure: TOTAL SHOULDER ARTHROPLASTY;  Surgeon: Marybelle Killings, MD;  Location: [facility];  Service: Orthopedics;  Laterality: Right;  Right Total Shoulder Arthroplasty, Cemented  . Cervical fusion    . Cataract extraction w/phaco Left 08/06/2013    Procedure: CATARACT EXTRACTION PHACO AND INTRAOCULAR LENS PLACEMENT LEFT EYE;  Surgeon: Tonny Branch, MD;  Location: [facility];  Service: Ophthalmology;  Laterality: Left;  CDE: 9.55  . Cataract extraction w/phaco Right 08/24/2013    Procedure: CATARACT EXTRACTION PHACO AND INTRAOCULAR LENS PLACEMENT (IOC);  Surgeon: Tonny Branch, MD;  Location: [facility];  Service: Ophthalmology;  Laterality: Right;  CDE:8.30  . Eye surgery    . Paraesophageal hernia repair  SEP 2010 DR. MCNATT  . Colonoscopy N/A 11/03/2013    Procedure: COLONOSCOPY;  Surgeon: Danie Binder, MD;  Location: [facility];  Service: Endoscopy;  Laterality: N/A;  11:15  .  Esophagogastroduodenoscopy N/A 11/03/2013    Procedure: ESOPHAGOGASTRODUODENOSCOPY (EGD);  Surgeon: Danie Binder, MD;  [facility]: [facility];  Service: Endoscopy;  Laterality: N/A;   HPI:  [name] [name] is a pleasant 73 y.o. female with a past medical history that includes hypertension, GERD, anemia and pneumonia for which she was hospitalized 3 months ago presents to the emergency department with chief complaint of shortness of breath with hemoptysis. Initial evaluation in the emergency department reveals right multilobular pneumonia. Patient reports that 3 days ago she was doing yard work for most of the day and at the end of that day she developed some chest "soreness". He noted that the soreness is worse with movement and attributed it to her physical activity. 2 days ago she developed a cough and some shortness of breath which worsened over time. Yesterday she developed hemoptysis. She denies fever chills headache dizziness syncope or near syncope. She denies abdominal pain diarrhea dysuria hematuria frequency or urgency. She does say that she has felt slightly nauseated but there has been no vomiting. She denies any recent travel or sick contacts. SLP ordered for clinical swallow evaluation due to pneumonia and history of esophageal dysphagia.  She underwent EGD with Dr. Oneida Alar 11/04/2013. Results show: HER LOW BLOOD COUNT IS DUE TO COLON POLYPS AND GASTRITIS. SHE had simple adenomas AND ONE SERRATED  ADENOMA removed from her colon. HER stomach Bx shows gastritis. NO MRI UNTIL AFTER OCT 1 DUE TO METAL CLIP IN COLON. CONTINUE OMEPRAZOLE. TAKE 30 MINUTES PRIOR TO YOUR MEALS TWICE DAILY. FOLLOW A gastric by pass diet. AVOID ITEMS THAT CAUSE BLOATING. MEATS SHOULD BE CHOPPED OR GROUND ONLY. DO NOT EAT CHUNKS OF ANYTHING. USE FIBER POWDER 1 PACKET THREE TIMES A DAY. AVOID ITEMS THAT CAUSE BLOATING & GAS. 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 [name] [name] is a delightful woman who was able to provide SLP personal medical history. Oral motor examination is unremarkable; vocal quality is mildly hoarse; pt clears throat at baseline (?related to reflux). She shows no signs or symptoms of aspiration at bedside during my evaluation, however she endorses globus sensation in mid esophagus with certain foods. She has been advised by Dr. Oneida Alar to consume chopped meats (no chunks). Pt has lower dentition, but not upper.   Although pt does not show signs of aspiration during po intake, she could be aspirating post prandially. She has a history of gastric bypass/sliding gastric pouch, GERD, globus sensation, gastritis, throat clearing, and mildly hoarse vocal quality. Pt reportedly takes a PPI with her breakfast meal. She was encouraged to take her PPI as soon as she awakens/30 minutes prior to meal to allow maximum benefit. Reflux strategies/precautions were reviewed with pt. She sleeps on a wedge at home, but does admit to eating and laying down. Pt verbalizes understanding of reflux precautions and risks for aspiration. No further SLP services indicated at this time.     Aspiration Risk  Mild    Diet Recommendation Thin liquid;Regular (mech soft meats)   Liquid Administration via: Cup;Straw Medication Administration: Whole meds with liquid Supervision: Patient able to self feed Postural Changes and/or Swallow Maneuvers: Out of bed for meals;Seated upright 90 degrees;Upright 30-60 min after meal    Other  Recommendations Oral Care Recommendations: Oral care BID Other Recommendations: Clarify dietary restrictions   Follow Up Recommendations  None    Frequency and Duration  N/A     Pertinent Vitals/Pain VSS; no pain    Swallow Study Prior Functional Status  Type of Home: House Available Help at Discharge: Family;Available PRN/intermittently    General Date of Onset: 12/28/13 HPI: [name] [name] is a pleasant 73 y.o. female with a past medical history that includes hypertension, GERD, anemia and pneumonia for which she was hospitalized 3 months ago presents to the emergency department with chief complaint of shortness of breath with hemoptysis. Initial evaluation in the emergency department reveals right multilobular pneumonia. Patient reports that 3 days ago she was doing yard work for most of the day and at the end of that day she developed some chest "soreness". He noted that the soreness is worse with movement and attributed it to her physical activity. 2 days ago she developed a cough and some shortness of breath which worsened over time. Yesterday she developed hemoptysis. She denies fever chills headache dizziness syncope or near syncope. She denies abdominal pain diarrhea dysuria hematuria frequency or urgency. She does say that she has felt slightly nauseated but there has been no vomiting. She denies any recent travel or sick contacts. She underwent EGD with Dr. Oneida Alar 11/04/2013. Results show: HER LOW BLOOD COUNT IS DUE TO COLON POLYPS AND GASTRITIS. SHE had simple adenomas AND ONE SERRATED ADENOMA removed from her colon. HER stomach  Bx shows gastritis. NO MRI UNTIL AFTER OCT 1 DUE TO METAL CLIP IN COLON. CONTINUE OMEPRAZOLE. TAKE 30 MINUTES PRIOR TO YOUR MEALS TWICE DAILY. FOLLOW A gastric by pass diet. AVOID ITEMS THAT CAUSE BLOATING. MEATS SHOULD BE CHOPPED OR GROUND ONLY. DO NOT EAT CHUNKS OF ANYTHING. USE FIBER POWDER 1 PACKET THREE TIMES A DAY. AVOID ITEMS THAT CAUSE BLOATING & GAS. 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 AND NON-ADHERENCE TO GASTRIC BYPASS DIET Type of Study: Bedside swallow evaluation Previous Swallow Assessment: none on record Diet Prior to this Study: Regular;Thin liquids Temperature Spikes Noted: No Respiratory Status: Room air History of Recent Intubation: No Behavior/Cognition: Alert;Cooperative;Pleasant mood Oral Cavity - Dentition: Missing  dentition (has lower dentition only, does not wear upper dentures) Self-Feeding Abilities: Able to feed self Patient Positioning: Upright in bed Baseline Vocal Quality: Clear (mild hoarseness) Volitional Cough: Strong Volitional Swallow: Able to elicit    Oral/Motor/Sensory Function Overall Oral Motor/Sensory Function: Appears within functional limits for tasks assessed   Ice Chips Ice chips: Not tested   Thin Liquid Thin Liquid: Within functional limits Presentation: Cup;Self Fed;Straw    Nectar Thick Nectar Thick Liquid: Not tested   Honey Thick Honey Thick Liquid: Not tested   Puree Puree: Within functional limits Presentation: Self Fed;Spoon   Solid       Solid: Within functional limits Presentation: Self Fed Other Comments:  (pt was told to eat soft foods per GI)      Thank you,  Genene Churn, [place]  Freyja Govea 12/29/2013,12:38 PM

## 2013-12-29 NOTE — Care Management Utilization Note (Signed)
UR completed 

## 2013-12-29 NOTE — Progress Notes (Signed)
[facility] PROGRESS NOTE  [name] [name]:[id number] DOB: 1940-08-25 DOA: 12/28/2013 PCP: [name], MD  Assessment/Plan: Pneumonia: Health care associated. Multilobular. Improved this am. Strep pneumo urine antigen negative. Await Legionella urine antigen. Provide vancomycin and cefapime day #2. Continue nebulizer and begin oxygen weaning. Patient with history of dysphasia. Await speech therapy swallow eval  Active Problems:  Cough with hemoptysis: Likely related to above. Improved today.  CT negative for PE she is not hypoxic.   Bacteremia: blood cultures +cocci, aerobic. She remains afebrile and non-toxic appearing. Will await sensitivity and likely discuss with ID. Antibiotics as above.    Hypokalemia: Resolved. Mag level 2.4 today.    Anemia: Chronic. Hemoglobin trending down somewhat this am but still close to baseline. No s/sx active bleeding. Will monitor   Hypertension: Controlled. We'll continue her home medications monitor closely   GERD (gastroesophageal reflux disease): Remains stable at baseline continue home medications   Anxiety: Appears stable at baseline   Morbid obesity: BMI 35.4   Code Status: full Family Communication: none present Disposition Plan:  Hopefully home tomorrow   Consultants:  none  Procedures:  none  Antibiotics:  Vancomycin 12/28/13>>>  Cefepime 12/28/13>>>  HPI/Subjective: Sitting in bed eating breakfast. Reports breathing better this am.   Objective: Filed Vitals:   12/29/13 0703  BP: 132/65  Pulse: 74  Temp: 98.4 F (36.9 C)  Resp: 18    Intake/Output Summary (Last 24 hours) at 12/29/13 0942 Last data filed at 12/29/13 0740  Gross per 24 hour  Intake 1799.17 ml  Output   3450 ml  Net -1650.83 ml   Filed Weights   12/28/13 0711 12/28/13 1109  Weight: 96.163 kg (212 lb) 97.4 kg (214 lb 11.7 oz)    Exam:   General:  Well nourished NAD  Cardiovascular: RRR No MGR no LE edema  Respiratory:  normal effort BS diminished particularly on right some improvement on left. Continue with fine crackles  Abdomen: obese soft +BS non-tender to palpation  Musculoskeletal: no clubbing or cyanosis   Data Reviewed: Basic Metabolic Panel:  Recent Labs Lab 12/28/13 0725 12/28/13 1328 12/29/13 0609  NA 139  --  141  K 3.6*  --  4.2  CL 103  --  105  CO2 26  --  26  GLUCOSE 98  --  104*  BUN 24*  --  18  CREATININE 1.15*  --  1.12*  CALCIUM 8.4  --  8.3*  MG  --  1.4* 2.4   Liver Function Tests: No results found for this basename: AST, ALT, ALKPHOS, BILITOT, PROT, ALBUMIN,  in the last 168 hours No results found for this basename: LIPASE, AMYLASE,  in the last 168 hours No results found for this basename: AMMONIA,  in the last 168 hours CBC:  Recent Labs Lab 12/28/13 0725 12/29/13 0609  WBC 8.4 9.7  NEUTROABS 7.4  --   HGB 9.4* 8.4*  HCT 31.7* 28.7*  MCV 80.3 79.5  PLT 162 170   Cardiac Enzymes:  Recent Labs Lab 12/28/13 0725 12/28/13 1328  TROPONINI <0.30 <0.30   BNP (last 3 results)  Recent Labs  12/28/13 0725  PROBNP 724.0*   CBG: No results found for this basename: GLUCAP,  in the last 168 hours  Recent Results (from the past 240 hour(s))  CULTURE, BLOOD (ROUTINE X 2)     Status: None   Collection Time    12/28/13  7:25 AM      Result Value Ref  Range Status   Specimen Description BLOOD RIGHT HAND   Final   Special Requests BOTTLES DRAWN AEROBIC AND ANAEROBIC 6CC   Final   Culture     Final   Value: GRAM POSITIVE COCCI Gram Stain Report Called to,Read Back By and Verified With: NIKKI PARRISH RN ON [id number] AT 2707 BY RESSEGGER R   Report Status PENDING   Incomplete  CULTURE, BLOOD (ROUTINE X 2)     Status: None   Collection Time    12/28/13  8:05 AM      Result Value Ref Range Status   Specimen Description Blood   Final   Special Requests NONE   Final   Culture NO GROWTH <24 HRS   Final   Report Status PENDING   Incomplete     Studies: Ct  Angio Chest W/cm &/or Wo Cm  12/28/2013   CLINICAL DATA:  Hemoptysis since yesterday, cough, chest pain  EXAM: CT ANGIOGRAPHY CHEST WITH CONTRAST  TECHNIQUE: Multidetector CT imaging of the chest was performed using the standard protocol during bolus administration of intravenous contrast. Multiplanar CT image reconstructions and MIPs were obtained to evaluate the vascular anatomy.  CONTRAST:  165mL OMNIPAQUE IOHEXOL 350 MG/ML SOLN  COMPARISON:  None.  FINDINGS: There is adequate opacification of the central pulmonary arteries, but the subsegmental branches are suboptimally opacified. There is no central or segmental pulmonary embolus. The main pulmonary artery, right main pulmonary artery and left main pulmonary arteries are normal in size. The heart size is normal. There is no pericardial effusion.  There is right upper lobe, right middle lobe and right lower lobe airspace disease most consistent with multi lobar pneumonia. There is no left lung consolidation. There is no pleural effusion or pneumothorax.  There is no axillary, hilar, or mediastinal adenopathy.  There is no lytic or blastic osseous lesion. 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  There are multiple surgical clips in the epigastric region from prior gastric surgery. There is a small hiatal hernia.  Review of the MIP images confirms the above findings.  IMPRESSION: 1. Right upper, middle and lower lobe pneumonia. 2. Adequate opacification of the central pulmonary arteries, but the subsegmental branches are suboptimally opacified. No central or segmental pulmonary embolus. 3. 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   Electronically Signed   By: Kathreen Devoid   On: 12/28/2013 09:12   Dg Chest Portable 1 View  12/28/2013   CLINICAL DATA:  Shortness of breath, hemoptysis, initial  evaluation, began 11 o'clock last night, history of pneumonia this year  EXAM: PORTABLE CHEST - 1 VIEW  COMPARISON:  10/01/2013  FINDINGS: Mild to moderate cardiac enlargement similar to prior study. Left lung is clear.  There is diffuse alveolar opacification throughout the right lung, sparing only the right apex. There is no evidence of pleural effusion .  IMPRESSION: Diffuse alveolar opacity on the right concerning for multilobar pneumonia.   Electronically Signed   By: Skipper Cliche M.D.   On: 12/28/2013 07:45    Scheduled Meds: . ceFEPime (MAXIPIME) IV  1 g Intravenous Q8H  . docusate sodium  100 mg Oral QHS  . enoxaparin (LOVENOX) injection  40 mg Subcutaneous Q24H  . ipratropium-albuterol  3 mL Nebulization Q4H  . iron polysaccharides  150 mg Oral BID  . pantoprazole  40 mg Oral  Daily  . potassium chloride  10 mEq Oral TID WC  . sertraline  100 mg Oral Daily  . sodium chloride  3 mL Intravenous Q12H  . traZODone  50 mg Oral QHS  . vancomycin  750 mg Intravenous Q12H   Continuous Infusions: . sodium chloride 50 mL/hr at 12/28/13 1325    Principal Problem:   Pneumonia Active Problems:   Cough with hemoptysis   Hypertension   GERD (gastroesophageal reflux disease)   Anxiety   Anemia   Morbid obesity   HCAP (healthcare-associated pneumonia)   Acute respiratory failure with hypoxia   Bacteremia    Time spent: 35 minutes    [facility] Pager [phone]. If 7PM-7AM, please contact night-coverage at [email or web address], password [facility] 12/29/2013, 9:42 AM  LOS: 1 day

## 2013-12-29 NOTE — Care Management Note (Signed)
    Page 1 of 1   12/31/2013     11:56:18 AM CARE MANAGEMENT NOTE 12/31/2013  Patient:  AVERILL, WINTERS   Account Number:  [id number]  Date Initiated:  12/29/2013  Documentation initiated by:  Vladimir Creeks  Subjective/Objective Assessment:   Admitted with sepsis, and PNA. Pt is from home alone, with family assistance as needed. She plans to return hnome at D/C     Action/Plan:   no needs identified, but will follow   Anticipated DC Date:  12/31/2013   Anticipated DC Plan:  [place]  CM consult      Choice offered to / List presented to:             Status of service:  Completed, signed off Medicare Important Message given?  YES (If response is "NO", the following Medicare IM given date fields will be blank) Date Medicare IM given:  12/31/2013 Medicare IM given by:  Theophilus Kinds Date Additional Medicare IM given:   Additional Medicare IM given by:    Discharge Disposition:  HOME/SELF CARE  Per UR Regulation:  Reviewed for med. necessity/level of care/duration of stay  If discussed at [place] of Stay Meetings, dates discussed:    Comments:  12/31/13 [place], RN BSN CM Pt discharged home today. No CM needs noted.  12/30/13 1200 Geneva Bolden RN/CM Pt weaned from O2 and is pleased she will not have to go home on O2 12/29/13 1415 Vladimir Creeks RN/CM

## 2013-12-29 NOTE — Progress Notes (Signed)
Patient seen and examined. Above note reviewed per Dyanne Carrel, NP.  This patient has been admitted with right-sided pneumonia. She's had for significant cough and hemoptysis. CT chest was negative for pulmonary embolus. Clinically, she appears to be doing remarkably well. She was found to have 1 out of 2 positive blood cultures for gram-positive cocci. This will need to be further followed and likely discussed with infectious disease. We'll continue current antibiotics for now. Anticipate discharge home in the next 24-48 hours pending further clinical improvement.  Monisha Siebel

## 2013-12-29 NOTE — Progress Notes (Signed)
Notified Dr Roderic Palau of blood cultures from 12/28/13 gram + cocci, aerobic.

## 2013-12-29 NOTE — Evaluation (Signed)
Physical Therapy Evaluation Patient Details Name: [name] [name] MRN: [id number] DOB: [birth year] Today's Date: 12/29/2013   History of Present Illness   [name] is a pleasant 73 y.o. female with a past medical history that includes hypertension, GERD, anemia and pneumonia for which she was hospitalized 3 months ago presents to the emergency department with chief complaint of shortness of breath with hemoptysis. Initial evaluation in the emergency department reveals right multilobular pneumonia.  Clinical Impression  Pt is mod. Independent with basic mobility. Pt has a h/o left knee arthritis and reports she needs a TKR. Pt ambulated 1101ft with cane mod. Indep. Pt maintained O2 saturation at 100% after gait on RA. Pt lives alone, but has family available to assist if needed. Pt is at baseline level. Recommend ambulation on the unit Independently until d/c home. Pt does have an acute skilled PT need and is d/c from PT services.    Follow Up Recommendations No PT follow up    Equipment Recommendations  None recommended by PT    Recommendations for Other Services       Precautions / Restrictions Precautions Precautions: Other (comment) (contact precautions) Restrictions Weight Bearing Restrictions: No      Mobility  Bed Mobility Overal bed mobility: Modified Independent                Transfers Overall transfer level: Modified independent Equipment used: Straight cane             General transfer comment: wide BOS due to obesity  Ambulation/Gait Ambulation/Gait assistance: Modified independent (Device/Increase time) Ambulation Distance (Feet): 150 Feet Assistive device: Straight cane Gait Pattern/deviations: Step-through pattern;Decreased stride length   Gait velocity interpretation: Below normal speed for age/gender    Stairs            Wheelchair Mobility    Modified Rankin (Stroke Patients Only)       Balance Overall balance assessment: No  apparent balance deficits (not formally assessed)                                           Pertinent Vitals/Pain Pain Assessment: No/denies pain    Home Living Family/patient expects to be discharged to:: Private residence Living Arrangements: Alone Available Help at Discharge: Family;Available PRN/intermittently Type of Home: House Home Access: Stairs to enter Entrance Stairs-Rails: None Entrance Stairs-Number of Steps: 2 Home Layout: One level Home Equipment: Walker - 2 wheels;Cane - single point Additional Comments: has a rw, but doesn't like to use it outside the house    Prior Function Level of Independence: Independent with assistive device(s)               Hand Dominance        Extremity/Trunk Assessment   Upper Extremity Assessment: Defer to OT evaluation           Lower Extremity Assessment: Overall WFL for tasks assessed      Cervical / Trunk Assessment: Kyphotic  Communication   Communication: No difficulties  Cognition Arousal/Alertness: Awake/alert Behavior During Therapy: WFL for tasks assessed/performed Overall Cognitive Status: Within Functional Limits for tasks assessed                      General Comments General comments (skin integrity, edema, etc.): Left knee discomfort due to needing a TKR. Pt manages with the use of a cane.  Exercises        Assessment/Plan    PT Assessment Patent does not need any further PT services  PT Diagnosis     PT Problem List    PT Treatment Interventions     PT Goals (Current goals can be found in the Care Plan section)      Frequency     Barriers to discharge        Co-evaluation               End of Session Equipment Utilized During Treatment: Gait belt Activity Tolerance: Patient tolerated treatment well Patient left: in bed;with call bell/phone within reach Nurse Communication: Mobility status         Time: 0821-0848 PT Time Calculation  (min): 27 min   Charges:   PT Evaluation $Initial PT Evaluation Tier I: 1 Procedure PT Treatments $Gait Training: 8-22 mins   PT G Codes:          Lelon Mast 12/29/2013, 8:58 AM

## 2013-12-30 LAB — BASIC METABOLIC PANEL
Anion gap: 11 (ref 5–15)
BUN: 15 mg/dL (ref 6–23)
CO2: 26 mEq/L (ref 19–32)
Calcium: 8.5 mg/dL (ref 8.4–10.5)
Chloride: 103 mEq/L (ref 96–112)
Creatinine, Ser: 1.14 mg/dL — ABNORMAL HIGH (ref 0.50–1.10)
GFR calc Af Amer: 54 mL/min — ABNORMAL LOW (ref >90)
GFR calc non Af Amer: 47 mL/min — ABNORMAL LOW (ref >90)
Glucose, Bld: 107 mg/dL — ABNORMAL HIGH (ref 70–99)
Potassium: 3.8 mEq/L (ref 3.7–5.3)
Sodium: 140 mEq/L (ref 137–147)

## 2013-12-30 LAB — CBC
HCT: 27.7 % — ABNORMAL LOW (ref 36.0–46.0)
Hemoglobin: 8.3 g/dL — ABNORMAL LOW (ref 12.0–15.0)
MCH: 23.4 pg — ABNORMAL LOW (ref 26.0–34.0)
MCHC: 30 g/dL (ref 30.0–36.0)
MCV: 78 fL (ref 78.0–100.0)
Platelets: 191 10*3/uL (ref 150–400)
RBC: 3.55 MIL/uL — ABNORMAL LOW (ref 3.87–5.11)
RDW: 17.3 % — ABNORMAL HIGH (ref 11.5–15.5)
WBC: 7 10*3/uL (ref 4.0–10.5)

## 2013-12-30 MED ORDER — IPRATROPIUM-ALBUTEROL 0.5-2.5 (3) MG/3ML IN SOLN
3.0000 mL | Freq: Four times a day (QID) | RESPIRATORY_TRACT | Status: DC
  Administered 2013-12-30 – 2013-12-31 (×3): 3 mL via RESPIRATORY_TRACT
  Filled 2013-12-30 (×4): qty 3

## 2013-12-30 MED ORDER — LEVOFLOXACIN 500 MG PO TABS
500.0000 mg | ORAL_TABLET | Freq: Every day | ORAL | Status: DC
  Administered 2013-12-30 – 2013-12-31 (×2): 500 mg via ORAL
  Filled 2013-12-30 (×2): qty 1

## 2013-12-30 NOTE — Progress Notes (Signed)
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 Multilobular vs possible aspiration PNA Continues to improve. Oxygen saturation level 93-95% on room air.  Strep pneumo urine antigen and legionella urine antigen negative. transition vancomycin and cefapime day to levaquin 500 daily. Continue nebulizer.  Blood cultures grew gram neg rods 1/2 bottle, likely contaminant, so await cultures  Dysphagia-Patient with history of dysphasia and evaluated by ST who opined no signs aspiration during po intake but could be aspirating post prandially related to reflux. Educated to strategies/precautions and instructed to take PPI 30 minutes prior to eating.   Cough with hemoptysis: resolved.  Likely related to above. CT negative for PE she is not hypoxic.   Bacteremia: blood cultures gram negative rods. Max temp 99.3. She remains non-toxic appearing.   Hypokalemia: Resolved.  Anemia: Chronic. Hemoglobin stable at baseline. No s/sx active bleeding.   Hypertension: Controlled.not currently on any medications.  Monitor trends  GERD (gastroesophageal reflux disease): see #1.  Remains stable at baseline continue home medications   Anxiety: continue Zoloft 100 daily.trazadone 50 qhs, valium 5 mg tid prn  Morbid obesity: BMI 35.4    Code Status: full Family Communication: none present Disposition Plan: home in the next 24 hours   Consultants:  none  Procedures:  none  Antibiotics:  Vancomycin 12/28/13>>10/28  Cefepime 12/28/13>>10/28  Levaquin 10/28  HPI/Subjective: Awake. Reports breathing improved but still feel "not right". Unable to articulate specific complaint.    Objective: Filed Vitals:   12/30/13 0534  BP: 146/71  Pulse: 93  Temp: 98.8 F (37.1 C)  Resp: 15    Intake/Output Summary (Last 24 hours) at 12/30/13 1105 Last data filed at 12/30/13 0846  Gross per 24 hour  Intake 1079.33 ml  Output   3100 ml  Net -2020.67 ml   Filed Weights   12/28/13 0711 12/28/13 1109  Weight: 96.163 kg (212 lb) 97.4 kg (214 lb 11.7 oz)    Exam:   General:  Obese calm appears comfortable  Cardiovascular: RRR No MGR No LE edema  Respiratory: normal effort BS with improved air flow particularly on right. No wheeze no rhonchi  Abdomen: obese soft +BS non-tender to palpation no guarding  Musculoskeletal: joints without swelling/erythema. Ambulated to BR with steady gait   Data Reviewed: Basic Metabolic Panel:  Recent Labs Lab 12/28/13 0725 12/28/13 1328 12/29/13 0609 12/30/13 0547  NA 139  --  141 140  K 3.6*  --  4.2 3.8  CL 103  --  105 103  CO2 26  --  26 26  GLUCOSE 98  --  104* 107*  BUN 24*  --  18 15  CREATININE 1.15*  --  1.12* 1.14*  CALCIUM 8.4  --  8.3* 8.5  MG  --  1.4* 2.4  --    Liver Function Tests: No results found for this basename: AST, ALT, ALKPHOS, BILITOT, PROT, ALBUMIN,  in the last 168 hours No results found for this basename: LIPASE, AMYLASE,  in the last 168 hours No results found for this basename: AMMONIA,  in the last 168 hours CBC:  Recent Labs Lab 12/28/13 0725 12/29/13 0609 12/30/13 0547  WBC 8.4 9.7 7.0  NEUTROABS 7.4  --   --  HGB 9.4* 8.4* 8.3*  HCT 31.7* 28.7* 27.7*  MCV 80.3 79.5 78.0  PLT 162 170 191   Cardiac Enzymes:  Recent Labs Lab 12/28/13 0725 12/28/13 1328  TROPONINI <0.30 <0.30   BNP (last 3 results)  Recent Labs  12/28/13 0725  PROBNP 724.0*   CBG: No results found for this basename: GLUCAP,  in the last 168 hours  Recent Results (from the past 240 hour(s))  CULTURE, BLOOD (ROUTINE X 2)     Status: None   Collection Time    12/28/13  7:25 AM      Result  Value Ref Range Status   Specimen Description BLOOD RIGHT HAND   Final   Special Requests BOTTLES DRAWN AEROBIC AND ANAEROBIC [facility]   Final   Culture  Setup Time     Final   Value: 12/29/2013 15:07     Performed at [place]     Final   Value: [place]     Note: PREVIOUSLY REPORTED AS GRAM POSITIVE COCCI CORRECTED RESULTS CALLED TO: MARYANNE BICKERSON BY INGRAM A 12/30/13 1030AM     Note: Gram Stain Report Called to,Read Back By and Verified With: Abelardo Diesel RN ON [id number] AT [place] BY RESSEGGER R Performed at [facility]     Performed at [facility]   Report Status PENDING   Incomplete  CULTURE, BLOOD (ROUTINE X 2)     Status: None   Collection Time    12/28/13  8:05 AM      Result Value Ref Range Status   Specimen Description BLOOD RIGHT ARM   Final   Special Requests     Final   Value: BOTTLES DRAWN AEROBIC AND ANAEROBIC AEB=10CC ANA=6CC   Culture NO GROWTH 2 DAYS   Final   Report Status PENDING   Incomplete  CLOSTRIDIUM DIFFICILE BY PCR     Status: None   Collection Time    12/29/13  9:30 AM      Result Value Ref Range Status   C difficile by pcr NEGATIVE  NEGATIVE Final     Studies: No results found.  Scheduled Meds: . ceFEPime (MAXIPIME) IV  1 g Intravenous Q8H  . docusate sodium  100 mg Oral QHS  . enoxaparin (LOVENOX) injection  40 mg Subcutaneous Q24H  . ipratropium-albuterol  3 mL Nebulization Q6H  . iron polysaccharides  150 mg Oral BID  . pantoprazole  40 mg Oral Daily  . potassium chloride  10 mEq Oral TID WC  . sertraline  100 mg Oral Daily  . sodium chloride  3 mL Intravenous Q12H  . traZODone  50 mg Oral QHS  . vancomycin  750 mg Intravenous Q12H   Continuous Infusions: . sodium chloride 10 mL/hr at 12/29/13 1237    Principal Problem:   Pneumonia Active Problems:   Cough with hemoptysis   Hypertension   GERD (gastroesophageal reflux disease)   Anxiety   Anemia   Morbid obesity   HCAP  (healthcare-associated pneumonia)   Acute respiratory failure with hypoxia   Bacteremia    Time spent: 35 minutes    [facility] Pager [phone]. If 7PM-7AM, please contact night-coverage at [email or web address], password [facility] 12/30/2013, 11:05 AM  LOS: 2 days        I agree with the History/assesment & plan per Midlevel provider as per above, and independently assessed and discussed the plan of care with the patient, and ammendments were made to  above note reflecting my thoughts after careful review of Database, Progress notes, Imaging and Consultant notes Verneita Griffes, MD [facility] [phone]

## 2013-12-31 LAB — BASIC METABOLIC PANEL
Anion gap: 10 (ref 5–15)
BUN: 18 mg/dL (ref 6–23)
CO2: 26 mEq/L (ref 19–32)
Calcium: 8.7 mg/dL (ref 8.4–10.5)
Chloride: 106 mEq/L (ref 96–112)
Creatinine, Ser: 1.09 mg/dL (ref 0.50–1.10)
GFR calc Af Amer: 57 mL/min — ABNORMAL LOW (ref >90)
GFR calc non Af Amer: 49 mL/min — ABNORMAL LOW (ref >90)
Glucose, Bld: 110 mg/dL — ABNORMAL HIGH (ref 70–99)
Potassium: 4.1 mEq/L (ref 3.7–5.3)
Sodium: 142 mEq/L (ref 137–147)

## 2013-12-31 LAB — CBC WITH DIFFERENTIAL/PLATELET
Basophils Absolute: 0 10*3/uL (ref 0.0–0.1)
Basophils Relative: 0 % (ref 0–1)
Eosinophils Absolute: 0.2 10*3/uL (ref 0.0–0.7)
Eosinophils Relative: 5 % (ref 0–5)
HCT: 29.7 % — ABNORMAL LOW (ref 36.0–46.0)
Hemoglobin: 8.9 g/dL — ABNORMAL LOW (ref 12.0–15.0)
Lymphocytes Relative: 15 % (ref 12–46)
Lymphs Abs: 0.8 10*3/uL (ref 0.7–4.0)
MCH: 23.5 pg — ABNORMAL LOW (ref 26.0–34.0)
MCHC: 30 g/dL (ref 30.0–36.0)
MCV: 78.4 fL (ref 78.0–100.0)
Monocytes Absolute: 0.5 10*3/uL (ref 0.1–1.0)
Monocytes Relative: 11 % (ref 3–12)
Neutro Abs: 3.4 10*3/uL (ref 1.7–7.7)
Neutrophils Relative %: 69 % (ref 43–77)
Platelets: 182 10*3/uL (ref 150–400)
RBC: 3.79 MIL/uL — ABNORMAL LOW (ref 3.87–5.11)
RDW: 17.2 % — ABNORMAL HIGH (ref 11.5–15.5)
WBC: 4.9 10*3/uL (ref 4.0–10.5)

## 2013-12-31 LAB — CULTURE, BLOOD (ROUTINE X 2)

## 2013-12-31 MED ORDER — DM-GUAIFENESIN ER 30-600 MG PO TB12
1.0000 | ORAL_TABLET | Freq: Two times a day (BID) | ORAL | Status: DC | PRN
Start: 2013-12-31 — End: 2014-05-10

## 2013-12-31 MED ORDER — CIPROFLOXACIN HCL 500 MG PO TABS: 500.0000 mg | ORAL_TABLET | Freq: Two times a day (BID) | ORAL | Status: DC

## 2013-12-31 NOTE — Discharge Summary (Signed)
I agree with the History/assesment & plan per Midlevel provider as per above, and independently assessed and discussed the plan of care with the patient, and ammendments were made to above note reflecting my thoughts after careful review of Database, Progress notes, Imaging and Consultant notes  Patient feels well and is stable for home Needs CXR in about 1 month to denote clearance of PNA Needs good Reflux control, weight loss and PPI Would recommned decreasing doses of Xanax as OP if possible Might benefit from GI input into  Long-standing GERD for r/o Barrett's or h pylori   Verneita Griffes, MD [facility] [phone]

## 2013-12-31 NOTE — Discharge Summary (Signed)
Physician Discharge Summary  [name] [name] GDJ:[id number] DOB: September 22, 1940 DOA: 12/28/2013  PCP: [name], MD  Admit date: 12/28/2013 Discharge date: 12/31/2013  Time spent: 40 minutes  Recommendations for Outpatient Follow-up:  1. Follow up with PCP for evaluation of symptoms i.e. Resolution of pneumonia   Discharge Diagnoses:  Principal Problem:   Pneumonia Active Problems:   Cough with hemoptysis   Hypertension   GERD (gastroesophageal reflux disease)   Anxiety   Anemia   Morbid obesity   HCAP (healthcare-associated pneumonia)   Acute respiratory failure with hypoxia   Bacteremia   Discharge Condition: stable  Diet recommendation: heart healthy, mechanical soft meats, thin liquid  Filed Weights   12/28/13 0711 12/28/13 1109  Weight: 96.163 kg (212 lb) 97.4 kg (214 lb 11.7 oz)    History of present illness:  [name] [name] is a pleasant 73 y.o. female with a past medical history that includes hypertension, GERD, anemia and pneumonia for which she was hospitalized 3 months prior to presentation on 12/28/13 to the emergency department with chief complaint of shortness of breath with hemoptysis. Initial evaluation in the emergency department revealed right multilobular pneumonia.   Patient reported that 3 days prior she was doing yard work for most of the day and at the end of that day she developed some chest "soreness". She noted that the soreness was worse with movement and attributed it to her physical activity. 2 days prior she developed a cough and some shortness of breath which worsened over time. One day prior she developed hemoptysis. She denied fever chills headache dizziness syncope or near syncope. She denied abdominal pain diarrhea dysuria hematuria frequency or urgency. She did say that she had felt slightly nauseated but there had been no vomiting. She denied any recent travel or sick contacts.   Workup in the emergency department included complete metabolic  panel significant for creatinine of 1.15, potassium 3.6 otherwise unremarkable, complete blood count significant for hemoglobin of 9.4 otherwise unremarkable, proBNP 724, lactic acid 1.55 and initial troponin negative. CT NGO of the chest yielded right upper, middle and lower lobe pneumonia. Adequate opacification of the central pulmonary arteries, but the subsegmental branches are suboptimally opacified. No central or segmental pulmonary embolus. 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  Her oral temperature 99.5 she was hemodynamically stable and not hypoxic. In the emergency department she iwa given 1 L of normal saline intravenously 1 DuoNeb cefepime and vancomycin.   Hospital Course:  Pneumonia: Health care associated. Multilobular vs possible aspiration PNA. Admitted and provided with antibiotics and supportive therapy. Max temp 99.8 orally. No leukocytosis. Sputum culture with Acinetobacter LWoffii sensitive to Cipro. Strep pneumo urine antigen and legionella urine antigen negative. Will discharge with 5 more days cipro to complete 8 day course. She remained afebrile and non-toxic appearing. Recommend follow up with PCP 1 week for evaluation of symptoms Blood cultures grew gram neg rods 1/2 bottle, likely contaminant. See above   Dysphagia-Patient with history of dysphasia and evaluated by ST who opined no signs aspiration during po intake but could be aspirating post prandially related to reflux. Educated to strategies/precautions and instructed to take PPI 30 minutes prior to eating.   Cough with hemoptysis: resolved at discharge. Likely related to above. CT negative for PE she was not hypoxic.   Bacteremia: blood cultures gram negative rods 1/2. Likely contaminant. Max temp 99.3. She remained non-toxic appearing.  Hypokalemia: Resolved.  Anemia: Chronic. Hemoglobin stable at baseline. No s/sx active bleeding.    Hypertension: Controlled.   GERD (gastroesophageal reflux disease): see #1. Remained stable at baseline   Anxiety: stable at baseline.    Morbid obesity: BMI 35.4   Procedures:  none  Consultations:  none  Discharge Exam: Filed Vitals:   12/31/13 0529  BP: 122/67  Pulse: 67  Temp: 98.6 F (37 C)  Resp: 18    General: obese up in chair appears comfortable.  Cardiovascular: RRR No MGR No LE edema Respiratory: normal effort BS clear bilaterally improved air flow throughout  Discharge Instructions You were cared for by a hospitalist during your hospital stay. If you have any questions about your discharge medications or the care you received while you were in the hospital after you are discharged, you can call the unit and asked to speak with the hospitalist on call if the hospitalist that took care of you is not available. Once you are discharged, your primary care physician will handle any further medical issues. Please note that NO REFILLS for any discharge medications will be authorized once you are discharged, as it is imperative that you return to your primary care physician (or establish a relationship with a primary care physician if you do not have one) for your aftercare needs so that they can reassess your need for medications and monitor your lab values.   Current Discharge Medication List    START taking these medications   Details  ciprofloxacin (CIPRO) 500 MG tablet Take 1 tablet (500 mg total) by mouth 2 (two) times daily. Qty: 10 tablet, Refills: 0    dextromethorphan-guaiFENesin (MUCINEX DM) 30-600 MG per 12 hr tablet Take 1 tablet by mouth 2 (two) times daily as needed for cough. Qty: 30 tablet, Refills: 0      CONTINUE these medications which have NOT CHANGED   Details  Calcium Citrate-Vitamin D (CITRACAL PETITES/VITAMIN D) 200-250 MG-UNIT TABS Take 1 tablet by mouth daily.    diazepam (VALIUM) 5 MG tablet Take 5 mg by mouth 3 (three) times daily  as needed. For anxiety    diphenhydrAMINE (BENADRYL) 25 MG tablet Take 25 mg by mouth every 6 (six) hours as needed for allergies.    docusate sodium (COLACE) 100 MG capsule Take 100 mg by mouth at bedtime.     HYDROcodone-acetaminophen (NORCO) 10-325 MG per tablet Take 1 tablet by mouth every 6 (six) hours as needed for moderate pain.    iron polysaccharides (NIFEREX) 150 MG capsule Take 1 capsule (150 mg total) by mouth 2 (two) times daily. Qty: 60 capsule, Refills: 1    omeprazole (PRILOSEC) 20 MG capsule Take 20 mg by mouth 2 (two) times daily.    ondansetron (ZOFRAN) 4 MG tablet Take 1 tablet (4 mg total) by mouth every 6 (six) hours as needed for nausea. Qty: 20 tablet, Refills: 0    pantoprazole (PROTONIX) 40 MG tablet Take 1 tablet (40 mg total) by mouth daily. Qty: 30 tablet, Refills: 0    potassium chloride (K-DUR) 10 MEQ tablet Take 10 mEq by mouth 3 (three) times daily with meals.     sertraline (ZOLOFT) 100 MG tablet Take 100 mg by mouth daily.    traZODone (DESYREL) 50 MG tablet Take 50 mg by mouth at bedtime.       Allergies  Allergen Reactions  . Novocain [Procaine Hcl]     Unknown-passed out with novocaine injected for dental surgery.  . Other Hives  EKG pads - need to use pediatric pads  . Latex Rash  . Penicillins Rash  . Sulfa Antibiotics Rash   Follow-up Information   Follow up with [name], MD. Schedule an appointment as soon as possible for a visit in 1 week. (follow up to ensure resolution of pneumonia)    Specialty:  Family Medicine   Contact information:   [address] [place] [address] [phone]        The results of significant diagnostics from this hospitalization (including imaging, microbiology, ancillary and laboratory) are listed below for reference.    Significant Diagnostic Studies: Ct Angio Chest W/cm &/or Wo Cm  12/28/2013   CLINICAL DATA:  Hemoptysis since yesterday, cough, chest pain  EXAM: CT ANGIOGRAPHY CHEST  WITH CONTRAST  TECHNIQUE: Multidetector CT imaging of the chest was performed using the standard protocol during bolus administration of intravenous contrast. Multiplanar CT image reconstructions and MIPs were obtained to evaluate the vascular anatomy.  CONTRAST:  184mL OMNIPAQUE IOHEXOL 350 MG/ML SOLN  COMPARISON:  None.  FINDINGS: There is adequate opacification of the central pulmonary arteries, but the subsegmental branches are suboptimally opacified. There is no central or segmental pulmonary embolus. The main pulmonary artery, right main pulmonary artery and left main pulmonary arteries are normal in size. The heart size is normal. There is no pericardial effusion.  There is right upper lobe, right middle lobe and right lower lobe airspace disease most consistent with multi lobar pneumonia. There is no left lung consolidation. There is no pleural effusion or pneumothorax.  There is no axillary, hilar, or mediastinal adenopathy.  There is no lytic or blastic osseous lesion. 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  There are multiple surgical clips in the epigastric region from prior gastric surgery. There is a small hiatal hernia.  Review of the MIP images confirms the above findings.  IMPRESSION: 1. Right upper, middle and lower lobe pneumonia. 2. Adequate opacification of the central pulmonary arteries, but the subsegmental branches are suboptimally opacified. No central or segmental pulmonary embolus. 3. 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   Electronically Signed   By: Kathreen Devoid   On: 12/28/2013 09:12   Dg Chest Portable 1 View  12/28/2013   CLINICAL DATA:  Shortness of breath, hemoptysis, initial evaluation, began 11 o'clock last night, history of pneumonia this year  EXAM: PORTABLE CHEST - 1 VIEW  COMPARISON:  10/01/2013  FINDINGS:  Mild to moderate cardiac enlargement similar to prior study. Left lung is clear.  There is diffuse alveolar opacification throughout the right lung, sparing only the right apex. There is no evidence of pleural effusion .  IMPRESSION: Diffuse alveolar opacity on the right concerning for multilobar pneumonia.   Electronically Signed   By: Skipper Cliche M.D.   On: 12/28/2013 07:45    Microbiology: Recent Results (from the past 240 hour(s))  CULTURE, BLOOD (ROUTINE X 2)     Status: None   Collection Time    12/28/13  7:25 AM      Result Value Ref Range Status   Specimen Description BLOOD RIGHT HAND   Final   Special Requests BOTTLES DRAWN AEROBIC AND ANAEROBIC [facility]   Final   Culture  Setup Time     Final   Value: 12/29/2013 15:07     Performed at [facility]   Culture     Final   Value: ACINETOBACTER LWOFFII     Note: PREVIOUSLY REPORTED AS GRAM POSITIVE COCCI CORRECTED RESULTS CALLED TO: MARYANNE BICKERSON BY INGRAM A 12/30/13 1030AM     Note: Gram Stain Report Called to,Read Back By and Verified With: Abelardo Diesel RN ON [phone] AT 0349 BY RESSEGGER R Performed at [facility]     Performed at [facility]   Report Status 12/31/2013 FINAL   Final   Organism ID, Bacteria ACINETOBACTER LWOFFII   Final  CULTURE, BLOOD (ROUTINE X 2)     Status: None   Collection Time    12/28/13  8:05 AM      Result Value Ref Range Status   Specimen Description BLOOD RIGHT ARM   Final   Special Requests     Final   Value: BOTTLES DRAWN AEROBIC AND ANAEROBIC AEB=10CC ANA=6CC   Culture NO GROWTH 3 DAYS   Final   Report Status PENDING   Incomplete  CLOSTRIDIUM DIFFICILE BY PCR     Status: None   Collection Time    12/29/13  9:30 AM      Result Value Ref Range Status   C difficile by pcr NEGATIVE  NEGATIVE Final     Labs: Basic Metabolic Panel:  Recent Labs Lab 12/28/13 0725 12/28/13 1328 12/29/13 0609 12/30/13 0547 12/31/13 0538  NA 139  --  141 140 142  K 3.6*  --  4.2  3.8 4.1  CL 103  --  105 103 106  CO2 26  --  26 26 26   GLUCOSE 98  --  104* 107* 110*  BUN 24*  --  18 15 18   CREATININE 1.15*  --  1.12* 1.14* 1.09  CALCIUM 8.4  --  8.3* 8.5 8.7  MG  --  1.4* 2.4  --   --    Liver Function Tests: No results found for this basename: AST, ALT, ALKPHOS, BILITOT, PROT, ALBUMIN,  in the last 168 hours No results found for this basename: LIPASE, AMYLASE,  in the last 168 hours No results found for this basename: AMMONIA,  in the last 168 hours CBC:  Recent Labs Lab 12/28/13 0725 12/29/13 0609 12/30/13 0547 12/31/13 0538  WBC 8.4 9.7 7.0 4.9  NEUTROABS 7.4  --   --  3.4  HGB 9.4* 8.4* 8.3* 8.9*  HCT 31.7* 28.7* 27.7* 29.7*  MCV 80.3 79.5 78.0 78.4  PLT 162 170 191 182   Cardiac Enzymes:  Recent Labs Lab 12/28/13 0725 12/28/13 1328  TROPONINI <0.30 <0.30   BNP: BNP (last 3 results)  Recent Labs  12/28/13 0725  PROBNP 724.0*   CBG: No results found for this basename: GLUCAP,  in the last 168 hours     Signed:  Radene Gunning  [facility] 12/31/2013, 11:32 AM

## 2013-12-31 NOTE — Progress Notes (Signed)
Pt alert and oriented. Vitals stable. Discharge paperwork and instructions reviewed. Cipro and mucinex prescription given to pt. Home meds returned to pt. Pt verbalized understanding of discharge instructions.

## 2014-01-05 ENCOUNTER — Telehealth: Payer: Self-pay | Admitting: Dietician

## 2014-01-05 LAB — CULTURE, BLOOD (ROUTINE X 2): Culture: NO GROWTH

## 2014-01-05 NOTE — Telephone Encounter (Signed)
Received voicemail from pt left on 01/01/14 at 1233. She would like information on a heart healthy diet.

## 2014-01-05 NOTE — Telephone Encounter (Signed)
Returned call at [phone]. Verified address. Informed her this RD would send her requested information. Mailed "Heart Healthy Eating Nutrition Therapy" handout from [employer] Care Manual to pt home via [place] [employer].

## 2014-01-12 ENCOUNTER — Encounter: Payer: Self-pay | Admitting: Gastroenterology

## 2014-01-19 DIAGNOSIS — E876 Hypokalemia: Secondary | ICD-10-CM | POA: Diagnosis not present

## 2014-01-19 DIAGNOSIS — Z23 Encounter for immunization: Secondary | ICD-10-CM | POA: Diagnosis not present

## 2014-01-19 DIAGNOSIS — I10 Essential (primary) hypertension: Secondary | ICD-10-CM | POA: Diagnosis not present

## 2014-01-19 DIAGNOSIS — N189 Chronic kidney disease, unspecified: Secondary | ICD-10-CM | POA: Diagnosis not present

## 2014-01-19 DIAGNOSIS — M1990 Unspecified osteoarthritis, unspecified site: Secondary | ICD-10-CM | POA: Diagnosis not present

## 2014-02-12 DIAGNOSIS — H3531 Nonexudative age-related macular degeneration: Secondary | ICD-10-CM | POA: Diagnosis not present

## 2014-02-23 ENCOUNTER — Telehealth: Payer: Self-pay | Admitting: Gastroenterology

## 2014-02-23 NOTE — Telephone Encounter (Signed)
PATIENT CALLED STATING THAT SHE HAS ANOTHER POLYP.  IS HAVING A HARD TIME SITTING.  SHOULD SHE CALL DR. FERGUSON.  DR FIELDS REFERRED HER TO HIM

## 2014-02-24 ENCOUNTER — Encounter: Payer: Self-pay | Admitting: Obstetrics and Gynecology

## 2014-02-24 ENCOUNTER — Ambulatory Visit (INDEPENDENT_AMBULATORY_CARE_PROVIDER_SITE_OTHER): Payer: Medicare Other | Admitting: Obstetrics and Gynecology

## 2014-02-24 VITALS — BP 168/90 | Ht 63.0 in | Wt 218.5 lb

## 2014-02-24 DIAGNOSIS — L708 Other acne: Secondary | ICD-10-CM

## 2014-02-24 DIAGNOSIS — L700 Acne vulgaris: Secondary | ICD-10-CM

## 2014-02-24 DIAGNOSIS — L739 Follicular disorder, unspecified: Secondary | ICD-10-CM

## 2014-02-24 DIAGNOSIS — L731 Pseudofolliculitis barbae: Secondary | ICD-10-CM | POA: Diagnosis not present

## 2014-02-24 NOTE — Telephone Encounter (Signed)
REVIEWED. AGREE. NO ADDITIONAL RECOMMENDATIONS. 

## 2014-02-24 NOTE — Telephone Encounter (Signed)
LATE ENTRY: I spoke to the pt yesterday approximately at 4:00 PM. She said she was having pain in her vaginal area and I told her that she needs to see Dr. Glo Herring and she said she would do so.

## 2014-02-24 NOTE — Progress Notes (Signed)
Patient ID: [name] [name], female   DOB: 09-21-1940, 73 y.o.   MRN: [id number] Pt here today for follow up, pt has new areas on her bottom. Pt states that she has another polyp on the inside.    [facility] Visit  Patient name: [name] [name] MRN [id number]  Date of birth: 1940-10-31  CC & HPI:  [name] is a 73 y.o. female presenting today for2 vulvar lesion that is sore. Pt has multiple comedones along labia majora, longstanding , up to 1 cm diameter. One near clitoris has become infected. Also, has multiple hyperkeratotic AK lesions along panty line, followed by Dermatology  ROS:  Pt wants the two hard comedones on right vulva drained.  Pertinent History Reviewed:   Reviewed: Significant for  Medical         Past Medical History  Diagnosis Date  . PONV (postoperative nausea and vomiting)   . Hypertension   . Depression   . Seasonal allergies   . Pneumonia   . Anxiety   . GERD (gastroesophageal reflux disease)   . H/O hiatal hernia   . Arthritis   . Anemia   . Obesity   . Sepsis 10/02/2013                              Surgical Hx:    Past Surgical History  Procedure Laterality Date  . Back surgery      lumbar x2 by Dr Arnoldo Morale  . Tonsillectomy    . Appendectomy    . Abdominal hysterectomy    . Cholecystectomy    . Joint replacement Right 1995    hip  . Joint replacement Left 2008    hip  . Medial partial knee replacement Left     Dr Ronnie Derby  . Shoulder arthroscopy Right   . Shoulder arthroscopy Right     x2  . Cardiac catheterization  1990's    "okay"  . Total shoulder arthroplasty Right 02/16/2013    Procedure: TOTAL SHOULDER ARTHROPLASTY;  Surgeon: Marybelle Killings, MD;  Location: [facility];  Service: Orthopedics;  Laterality: Right;  Right Total Shoulder Arthroplasty, Cemented  . Cervical fusion    . Cataract extraction w/phaco Left 08/06/2013    Procedure: CATARACT EXTRACTION PHACO AND INTRAOCULAR LENS PLACEMENT LEFT EYE;  Surgeon: Tonny Branch, MD;   Location: [facility];  Service: Ophthalmology;  Laterality: Left;  CDE: 9.55  . Cataract extraction w/phaco Right 08/24/2013    Procedure: CATARACT EXTRACTION PHACO AND INTRAOCULAR LENS PLACEMENT (IOC);  Surgeon: Tonny Branch, MD;  Location: [facility];  Service: Ophthalmology;  Laterality: Right;  CDE:8.30  . Eye surgery    . Paraesophageal hernia repair  SEP 2010 DR. MCNATT  . Colonoscopy N/A 11/03/2013    Procedure: COLONOSCOPY;  Surgeon: Danie Binder, MD;  Location: [facility];  Service: Endoscopy;  Laterality: N/A;  11:15  . Esophagogastroduodenoscopy N/A 11/03/2013    Procedure: ESOPHAGOGASTRODUODENOSCOPY (EGD);  Surgeon: Danie Binder, MD;  Location: [facility];  Service: Endoscopy;  Laterality: N/A;   Medications: Reviewed & Updated - see associated section                      Current outpatient prescriptions: Calcium Citrate-Vitamin D (CITRACAL PETITES/VITAMIN D) 200-250 MG-UNIT TABS, Take 1 tablet by mouth daily., Disp: , Rfl: ;  ciprofloxacin (CIPRO) 500 MG tablet, Take 1 tablet (500 mg total) by  mouth 2 (two) times daily., Disp: 10 tablet, Rfl: 0;  dextromethorphan-guaiFENesin (MUCINEX DM) 30-600 MG per 12 hr tablet, Take 1 tablet by mouth 2 (two) times daily as needed for cough., Disp: 30 tablet, Rfl: 0 diazepam (VALIUM) 5 MG tablet, Take 5 mg by mouth 3 (three) times daily as needed. For anxiety, Disp: , Rfl: ;  diphenhydrAMINE (BENADRYL) 25 MG tablet, Take 25 mg by mouth every 6 (six) hours as needed for allergies., Disp: , Rfl: ;  docusate sodium (COLACE) 100 MG capsule, Take 100 mg by mouth at bedtime. , Disp: , Rfl:  HYDROcodone-acetaminophen (NORCO) 10-325 MG per tablet, Take 1 tablet by mouth every 6 (six) hours as needed for moderate pain., Disp: , Rfl: ;  iron polysaccharides (NIFEREX) 150 MG capsule, Take 1 capsule (150 mg total) by mouth 2 (two) times daily., Disp: 60 capsule, Rfl: 1;  omeprazole (PRILOSEC) 20 MG capsule, Take 20 mg by mouth 2 (two) times daily., Disp: , Rfl:   ondansetron (ZOFRAN) 4 MG tablet, Take 1 tablet (4 mg total) by mouth every 6 (six) hours as needed for nausea., Disp: 20 tablet, Rfl: 0;  pantoprazole (PROTONIX) 40 MG tablet, Take 1 tablet (40 mg total) by mouth daily., Disp: 30 tablet, Rfl: 0;  potassium chloride (K-DUR) 10 MEQ tablet, Take 10 mEq by mouth 3 (three) times daily with meals. , Disp: , Rfl: ;  potassium chloride (MICRO-K) 10 MEQ CR capsule, , Disp: , Rfl:  sertraline (ZOLOFT) 100 MG tablet, Take 100 mg by mouth daily., Disp: , Rfl: ;  traZODone (DESYREL) 50 MG tablet, Take 50 mg by mouth at bedtime., Disp: , Rfl:    Social History: Reviewed -  reports that she has never smoked. She has never used smokeless tobacco.  Objective Findings:  Vitals: Blood pressure 168/90, height 5\' 3"  (1.6 m), weight 218 lb 8 oz (99.111 kg).  Physical Examination: General appearance - alert, well appearing, and in no distress, oriented to person, place, and time and overweight Mental status - alert, oriented to person, place, and time, normal mood, behavior, speech, dress, motor activity, and thought processes Abdomen - soft, nontender, nondistended, no masses or organomegaly Pelvic - VULVA: normal appearing vulva with no masses, tenderness or lesions, vulvar nodule comedone, the infected one, core is able to be expressed, inguinal area normal, VAGINA: normal appearing vagina with normal color and discharge, no lesions, 2 additional comedone are coated with lidocaine, and then opened with a 18 Ga needle.   Assessment & Plan:   A: infected comedone, expressed     2 stable comedones, lanced, left labium majora  P followup prn recurrence

## 2014-03-05 HISTORY — PX: CARPAL TUNNEL RELEASE: SHX101

## 2014-03-11 DIAGNOSIS — M25511 Pain in right shoulder: Secondary | ICD-10-CM | POA: Diagnosis not present

## 2014-03-15 DIAGNOSIS — G5601 Carpal tunnel syndrome, right upper limb: Secondary | ICD-10-CM | POA: Diagnosis not present

## 2014-03-17 ENCOUNTER — Ambulatory Visit (INDEPENDENT_AMBULATORY_CARE_PROVIDER_SITE_OTHER): Payer: Medicare Other | Admitting: Gastroenterology

## 2014-03-17 ENCOUNTER — Encounter: Payer: Self-pay | Admitting: Gastroenterology

## 2014-03-17 VITALS — BP 174/72 | HR 61 | Temp 97.6°F | Ht 63.0 in | Wt 222.8 lb

## 2014-03-17 DIAGNOSIS — D649 Anemia, unspecified: Secondary | ICD-10-CM | POA: Diagnosis not present

## 2014-03-17 DIAGNOSIS — R1310 Dysphagia, unspecified: Secondary | ICD-10-CM

## 2014-03-17 DIAGNOSIS — K219 Gastro-esophageal reflux disease without esophagitis: Secondary | ICD-10-CM | POA: Diagnosis not present

## 2014-03-17 NOTE — Progress Notes (Signed)
cc'ed to pcp °

## 2014-03-17 NOTE — Assessment & Plan Note (Signed)
SX CONTROLLED.  CONTINUE PROTNIX DAILY. FOLLOW UP IN 6 MOS.

## 2014-03-17 NOTE — Assessment & Plan Note (Signed)
NO BRBPR OR MELENA. PT HAS BEEN HOSPITALIZED OCT 2015 FOR GNR SEPSIS/PNA, HAD PERINEAL INFECTION DEC 2015 AND HAD CTS JAN 2016.  CHECK CBC IN 3 MOS. NO ADDITIONAL WORKUP NEEDED AT THIS TIME. NO INDICATION FOR ENDOSCOPY AT THIS TIME. FOLLOW UP IN 6 MOS.

## 2014-03-17 NOTE — Progress Notes (Signed)
Subjective:    Patient ID: [name] [name], female    DOB: 1940/12/30, 74 y.o.   MRN: [id number]  [name], MD  HPI WANTED TO RE-VISIT  FINDING/RECOMMENDATIONS FROM SEP 2015. DID HAVE TROUBLE SWALLOWING A TANGERINE AND ANOTHER TIME. HAS BEEN TRYING TO FOLLOW HER GASTRIC BYPASS DIET. BMS: W/ COLACE(2) QHS, 1-3X/DAY. DIDN'T TRIED THE FIBER POWDER. DOESN'T CARE FOR IT. BEEN CHILLY. WAS HOSPITALIZED FOR GNR SEPSIS/PNA OCT 2015. OCCASIONAL MILD ABD PAIN. Rare heartburn/indigestion if SHE EATS SOMETHING SHE IS NOT SUPPOSE TO.  PT DENIES FEVER, CHILLS, HEMATOCHEZIA, nausea, vomiting, melena, diarrhea, CHEST PAIN, SHORTNESS OF BREATH,  CHANGE IN BOWEL IN HABITS, constipation, OR problems with sedation.    Past Medical History  Diagnosis Date  . PONV (postoperative nausea and vomiting)   . Hypertension   . Depression   . Seasonal allergies   . Pneumonia   . Anxiety   . GERD (gastroesophageal reflux disease)   . H/O hiatal hernia   . Arthritis   . Anemia   . Obesity   . Sepsis 10/02/2013   Past Surgical History  Procedure Laterality Date  . Back surgery      lumbar x2 by Dr Arnoldo Morale  . Tonsillectomy    . Appendectomy    . Abdominal hysterectomy    . Cholecystectomy    . Joint replacement Right 1995    hip  . Joint replacement Left 2008    hip  . Medial partial knee replacement Left     Dr Ronnie Derby  . Shoulder arthroscopy Right   . Shoulder arthroscopy Right     x2  . Cardiac catheterization  1990's    "okay"  . Total shoulder arthroplasty Right 02/16/2013    Procedure: TOTAL SHOULDER ARTHROPLASTY;  Surgeon: Marybelle Killings, MD;  Location: [place];  Service: Orthopedics;  Laterality: Right;  Right Total Shoulder Arthroplasty, Cemented  . Cervical fusion    . Cataract extraction w/phaco Left 08/06/2013    Procedure: CATARACT EXTRACTION PHACO AND INTRAOCULAR LENS PLACEMENT LEFT EYE;  Surgeon: Tonny Branch, MD;  Location: [facility];  Service: Ophthalmology;  Laterality: Left;  CDE: 9.55  .  Cataract extraction w/phaco Right 08/24/2013    Procedure: CATARACT EXTRACTION PHACO AND INTRAOCULAR LENS PLACEMENT (IOC);  Surgeon: Tonny Branch, MD;  Location: [facility];  Service: Ophthalmology;  Laterality: Right;  CDE:8.30  . Eye surgery    . Paraesophageal hernia repair  SEP 2010 DR. MCNATT  . Colonoscopy N/A 11/03/2013    Procedure: COLONOSCOPY;  Surgeon: Danie Binder, MD;  [facility];  Service: Endoscopy;  Laterality: N/A;  11:15  . Esophagogastroduodenoscopy N/A 11/03/2013    Procedure: ESOPHAGOGASTRODUODENOSCOPY (EGD);  Surgeon: Danie Binder, MD;  [facility];  Service: Endoscopy;  Laterality: N/A;    Allergies  Allergen Reactions  . Novocain [Procaine Hcl]     Unknown-passed out with novocaine injected for dental surgery.  . Other Hives    EKG pads - need to use pediatric pads  . Latex Rash  . Penicillins Rash  . Sulfa Antibiotics Rash    Current Outpatient Prescriptions  Medication Sig Dispense Refill  . Calcium Citrate-Vitamin D (CITRACAL PETITES/VITAMIN D) 200-250 MG-UNIT TABS Take 1 tablet by mouth daily.    . diazepam (VALIUM) 5 MG tablet Take 5 mg by mouth 3 (three) times daily as needed. For anxiety    . diphenhydrAMINE (BENADRYL) 25 MG tablet Take 25 mg by mouth every 6 (six) hours as needed  for allergies.    Marland Kitchen docusate sodium (COLACE) 100 MG capsule Take 100 mg by mouth at bedtime.     Marland Kitchen HYDROcodone-acetaminophen (NORCO) 10-325 MG per tablet Take 1 tablet by mouth every 6 (six) hours as needed for moderate pain.    . iron polysaccharides (NIFEREX) 150 MG capsule Take 1 capsule (150 mg total) by mouth 2 (two) times daily. 60 capsule 1  . omeprazole (PRILOSEC) 20 MG capsule Take 20 mg by mouth 2 (two) times daily.    . ondansetron (ZOFRAN) 4 MG tablet Take 1 tablet (4 mg total) by mouth every 6 (six) hours as needed for nausea. 20 tablet 0  . pantoprazole (PROTONIX) 40 MG tablet Take 1 tablet (40 mg total) by mouth daily. 30 tablet 0  . potassium  chloride (K-DUR) 10 MEQ tablet Take 10 mEq by mouth 3 (three) times daily with meals.     . potassium chloride (MICRO-K) 10 MEQ CR capsule     . sertraline (ZOLOFT) 100 MG tablet Take 100 mg by mouth daily.    . traZODone (DESYREL) 50 MG tablet Take 50 mg by mouth at bedtime.    . ciprofloxacin (CIPRO) 500 MG tablet Take 1 tablet (500 mg total) by mouth 2 (two) times daily. (Patient not taking: Reported on 03/17/2014) 10 tablet 0  . dextromethorphan-guaiFENesin (MUCINEX DM) 30-600 MG per 12 hr tablet Take 1 tablet by mouth 2 (two) times daily as needed for cough. (Patient not taking: Reported on 03/17/2014) 30 tablet 0   No current facility-administered medications for this visit.     Review of Systems     Objective:   Physical Exam  Constitutional: She is oriented to person, place, and time. No distress.  HENT:  Head: Normocephalic and atraumatic.  Mouth/Throat: Oropharynx is clear and moist. No oropharyngeal exudate.  Eyes: Pupils are equal, round, and reactive to light. No scleral icterus.  Neck: Normal range of motion. Neck supple.  Cardiovascular: Normal rate and regular rhythm.   Murmur heard. Pulmonary/Chest: Effort normal and breath sounds normal. No respiratory distress.  Abdominal: Soft. Bowel sounds are normal. She exhibits no distension. There is no tenderness.  Musculoskeletal: She exhibits no edema.  RIGHT FOREARM IN BRACE. FINGERS PINK.  Lymphadenopathy:    She has no cervical adenopathy.  Neurological: She is alert and oriented to person, place, and time.  NO  NEW FOCAL DEFICITS   Psychiatric:  FLAT AFFECT, NL MOOD   Vitals reviewed.         Assessment & Plan:

## 2014-03-17 NOTE — Patient Instructions (Signed)
You need your blood count checked in 3 mos.   CONTINUE OMEPRAZOLE. TAKE 30 MINUTES PRIOR TO YOUR MEALS TWICE DAILY.  FOLLOW A gastric by pass diet. AVOID ITEMS THAT CAUSE BLOATING. MEATS SHOULD BE CHOPPED OR GROUND ONLY. DO NOT EAT CHUNKS OF ANYTHING.   CONTINUE COLACE 2 AT BEDTIME.  Follow up in [place] 2016 E30 CONSTIPATION, DYSPHAGIA, ANEMIA.  Next colonoscopy in 3 years BECAUSE OF THE TYPE OF POLYPS REMOVED FROM YOUR COLON.

## 2014-03-17 NOTE — Assessment & Plan Note (Signed)
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 Sx FAIRLY WELL CONTROLLED.  ENCOURAGED PT TO CONTINUE TO FOLLOW DIETARY RECOMMENDATIONS. FOLLOW UP IN 6 MOS.

## 2014-05-06 ENCOUNTER — Telehealth: Payer: Self-pay | Admitting: Gastroenterology

## 2014-05-06 NOTE — Telephone Encounter (Signed)
I called pt and she said she had diarrhea today, but she had been constipated for 3 days and she took laxative. She sometimes takes the stool softners and sometimes she doesn't. I told her she should take them regular if she has constipation. She is having some abdominal pain in the center right below her breasts. She said she has a hx of a blockage. She rates her pain at a 6 now, but said earlier today she would have rated it at a 10. I have scheduled her an appt with Walden Field, NP for 05/10/2014 at 9:30 AM. I advised if the pain worsens again to go to the ED and she said that she would.

## 2014-05-06 NOTE — Telephone Encounter (Signed)
Patient called this afternoon asking if she could see SF.  She had seen her back in January and said that she is not any better and can't go on like this. She said that she had diarrhea in the bed today also.  I asked if she had been given any medications from when she was seen last and she doesn't remember because she has so many doctors.  Please advise and I told her since it was late in the day it may be tomorrow before she hears back from [place]. She agreed. [phone]

## 2014-05-07 NOTE — Telephone Encounter (Signed)
REVIEWED. AGREE. NO ADDITIONAL RECOMMENDATIONS. 

## 2014-05-07 NOTE — Telephone Encounter (Signed)
Noted  

## 2014-05-10 ENCOUNTER — Encounter: Payer: Self-pay | Admitting: Nurse Practitioner

## 2014-05-10 ENCOUNTER — Encounter: Payer: Self-pay | Admitting: Gastroenterology

## 2014-05-10 ENCOUNTER — Telehealth: Payer: Self-pay | Admitting: Gastroenterology

## 2014-05-10 ENCOUNTER — Ambulatory Visit (INDEPENDENT_AMBULATORY_CARE_PROVIDER_SITE_OTHER): Payer: Medicare Other | Admitting: Nurse Practitioner

## 2014-05-10 VITALS — BP 164/83 | HR 90 | Temp 98.2°F | Ht 63.0 in | Wt 220.2 lb

## 2014-05-10 DIAGNOSIS — R1310 Dysphagia, unspecified: Secondary | ICD-10-CM

## 2014-05-10 NOTE — Assessment & Plan Note (Signed)
Compliance with diet seems better but still not optimal. Last EGD last year, no need for repeat at this time. Will order barium esophagram and UGI to assess esophagus and gastric pouch for stricture or other abnormalities. Re-enforced diet needs. Follow-up in 4 weeks at which point she'll be due for a repeat CBC and to review symptoms and results of testing.

## 2014-05-10 NOTE — Progress Notes (Signed)
Referring Provider: [name], MD Primary Care Physician:  [name], MD Primary GI: Dr. Oneida Alar  Chief Complaint  Patient presents with  . Abdominal Pain    HPI:   75 year old female here for abdominal pain. Has a medical history of gastric bypass and non-compliance with post bypass diet. Had gastric bypass in 1979 and had a revision in 1980. Had hiatal hernia repair at [facility] in 2011 to resolve blockage (per patient). Saw [facility] for this and Dr. Alvan Dame performed the procedure and follow-ups. Last EGD 11/04/13 which found sliding gastric pouch and mild non-erosive gastritis. Colonoscopy last done also 11/04/13 which found normal mucos of the terminal ileum, 13 colon polyps removed, moderate diverticulosis in the descending colon and sigmoid colon, left colon redundancy, and small internal hemorrhoids. Path report showed multiple tubular adenoma and hyperplastic polyps. Recommended 3 year follow-up.  Today she states she's been having symptoms including sensation of dysphagia which helps with external rubbing at the level of the esophagus and sometimes regurgitates. This typically occurs with a variety of foods from vegetables and sandwich to just soup. Is trying to follow post-gastric bypass diet including cutting sandwich into small strips. Avoids meats but will chop them with a food processor, but not all the time due to it's "not as good." Symptoms began recurring about a year ago. Also admits epigastric and esophageal pain when she's having dysphagia, and is described as a "choking pain." With external massage either will pass the food or will regurgitate it. Admits some nausea with her attacks and will occasionally vomit. Denies any new hematemesis since the last visit. Denies any other abdominal pain. GERD symptoms are well controlled with PPI. Denies any other upper or lower GI symptoms.  Past Medical History  Diagnosis Date  . PONV (postoperative nausea and vomiting)   .  Hypertension   . Depression   . Seasonal allergies   . Pneumonia   . Anxiety   . GERD (gastroesophageal reflux disease)   . H/O hiatal hernia   . Arthritis   . Anemia   . Obesity   . Sepsis 10/02/2013    Past Surgical History  Procedure Laterality Date  . Back surgery      lumbar x2 by Dr Arnoldo Morale  . Tonsillectomy    . Appendectomy    . Abdominal hysterectomy    . Cholecystectomy    . Joint replacement Right 1995    hip  . Joint replacement Left 2008    hip  . Medial partial knee replacement Left     Dr Ronnie Derby  . Shoulder arthroscopy Right   . Shoulder arthroscopy Right     x2  . Cardiac catheterization  1990's    "okay"  . Total shoulder arthroplasty Right 02/16/2013    Procedure: TOTAL SHOULDER ARTHROPLASTY;  Surgeon: Marybelle Killings, MD;  Location: [place];  Service: Orthopedics;  Laterality: Right;  Right Total Shoulder Arthroplasty, Cemented  . Cervical fusion    . Cataract extraction w/phaco Left 08/06/2013    Procedure: CATARACT EXTRACTION PHACO AND INTRAOCULAR LENS PLACEMENT LEFT EYE;  Surgeon: Tonny Branch, MD;  [facility]: [facility];  Service: Ophthalmology;  Laterality: Left;  CDE: 9.55  . Cataract extraction w/phaco Right 08/24/2013    Procedure: CATARACT EXTRACTION PHACO AND INTRAOCULAR LENS PLACEMENT (IOC);  Surgeon: Tonny Branch, MD;  [facility]: [facility];  Service: Ophthalmology;  Laterality: Right;  CDE:8.30  . Eye surgery    . Paraesophageal hernia repair  SEP 2010 DR.  MCNATT  . Colonoscopy N/A 11/03/2013    Procedure: COLONOSCOPY;  Surgeon: Danie Binder, MD;  [facility];  Service: Endoscopy;  Laterality: N/A;  11:15  . Esophagogastroduodenoscopy N/A 11/03/2013    Procedure: ESOPHAGOGASTRODUODENOSCOPY (EGD);  Surgeon: Danie Binder, MD;  [facility];  Service: Endoscopy;  Laterality: N/A;    Current Outpatient Prescriptions  Medication Sig Dispense Refill  . Calcium Citrate-Vitamin D (CITRACAL PETITES/VITAMIN D) 200-250 MG-UNIT TABS Take 1 tablet by  mouth daily.    . diazepam (VALIUM) 5 MG tablet Take 5 mg by mouth 3 (three) times daily as needed. For anxiety    . diphenhydrAMINE (BENADRYL) 25 MG tablet Take 25 mg by mouth every 6 (six) hours as needed for allergies.    Marland Kitchen docusate sodium (COLACE) 100 MG capsule Take 100 mg by mouth at bedtime.     Marland Kitchen HYDROcodone-acetaminophen (NORCO) 10-325 MG per tablet Take 1 tablet by mouth every 6 (six) hours as needed for moderate pain.    . iron polysaccharides (NIFEREX) 150 MG capsule Take 1 capsule (150 mg total) by mouth 2 (two) times daily. 60 capsule 1  . omeprazole (PRILOSEC) 20 MG capsule Take 20 mg by mouth 2 (two) times daily.    . pantoprazole (PROTONIX) 40 MG tablet Take 1 tablet (40 mg total) by mouth daily. 30 tablet 0  . potassium chloride (K-DUR) 10 MEQ tablet Take 10 mEq by mouth 3 (three) times daily with meals.     . potassium chloride (MICRO-K) 10 MEQ CR capsule     . sertraline (ZOLOFT) 100 MG tablet Take 100 mg by mouth daily.    . traZODone (DESYREL) 50 MG tablet Take 50 mg by mouth at bedtime.    . ciprofloxacin (CIPRO) 500 MG tablet Take 1 tablet (500 mg total) by mouth 2 (two) times daily. (Patient not taking: Reported on 03/17/2014) 10 tablet 0  . dextromethorphan-guaiFENesin (MUCINEX DM) 30-600 MG per 12 hr tablet Take 1 tablet by mouth 2 (two) times daily as needed for cough. (Patient not taking: Reported on 03/17/2014) 30 tablet 0  . ondansetron (ZOFRAN) 4 MG tablet Take 1 tablet (4 mg total) by mouth every 6 (six) hours as needed for nausea. (Patient not taking: Reported on 05/10/2014) 20 tablet 0   No current facility-administered medications for this visit.    Allergies as of 05/10/2014 - Review Complete 05/10/2014  Allergen Reaction Noted  . Novocain [procaine hcl]  11/20/2011  . Other Hives 11/20/2011  . Latex Rash 07/22/2013  . Penicillins Rash 11/20/2011  . Sulfa antibiotics Rash 11/20/2011    Family History  Problem Relation Age of Onset  . Colon cancer Brother      IN HIS 60s-METASTATIC  . Hypertension Brother   . Colon polyps Paternal Grandfather   . Breast cancer Mother   . Hypertension Father     History   Social History  . Marital Status: Single    Spouse Name: N/A  . Number of Children: N/A  . Years of Education: N/A   Social History Main Topics  . Smoking status: Never Smoker   . Smokeless tobacco: Never Used  . Alcohol Use: No  . Drug Use: No  . Sexual Activity: Yes    Birth Control/ Protection: Surgical   Other Topics Concern  . None   Social History Narrative    Review of Systems: All 10 point ROS negative except for HPI and ROS below. Gen: Denies fever, chills, anorexia. Denies  fatigue, weakness, weight loss.  CV: Denies chest pain, palpitations, syncope, peripheral edema. Resp: Denies dyspnea at rest, cough, wheezing, coughing up blood, and pleurisy.  Physical Exam: BP 164/83 mmHg  Pulse 90  Temp(Src) 98.2 F (36.8 C) (Oral)  Ht 5\' 3"  (1.6 m)  Wt 220 lb 3.2 oz (99.882 kg)  BMI 39.02 kg/m2 General:   Obese Alert and oriented. No distress noted. Pleasant and cooperative.  Head:  Normocephalic and atraumatic. Eyes:  Conjuctiva clear without scleral icterus. Mouth:  Oral mucosa pink and moist. Good dentition. No lesions. Neck:  Supple, without mass or thyromegaly. Lungs:  Clear to auscultation bilaterally. No wheezes, rales, or rhonchi. No distress.  Heart:  S1, S2 present without murmurs, rubs, or gallops. Regular rate and rhythm. Abdomen:  +BS, soft, non-tender and non-distended. No rebound or guarding. No HSM or masses noted. Msk:  Symmetrical without gross deformities. Normal posture. Pulses:  2+ DP noted bilaterally Extremities:  Without edema. Neurologic:  Alert and  oriented x4;  grossly normal neurologically. Skin:  Intact without significant lesions or rashes. Cervical Nodes:  No significant cervical adenopathy. Psych:  Alert and cooperative. Normal mood and affect.    05/10/2014 10:17 AM

## 2014-05-10 NOTE — Patient Instructions (Signed)
1. We will do tests to evaluate your esophagus and pouch 2. Return for follow-up in 4 weeks or sooner any changes/worsening problems. 3. Follow a gastric bypass diet.

## 2014-05-11 NOTE — Progress Notes (Signed)
CC'ED TO PCP 

## 2014-05-18 ENCOUNTER — Ambulatory Visit (HOSPITAL_COMMUNITY)
Admission: RE | Admit: 2014-05-18 | Discharge: 2014-05-18 | Disposition: A | Discharge status: Home / Self Care | Payer: Medicare Other | Source: Ambulatory Visit | Attending: Nurse Practitioner | Admitting: Nurse Practitioner

## 2014-05-18 DIAGNOSIS — R1310 Dysphagia, unspecified: Secondary | ICD-10-CM

## 2014-05-19 ENCOUNTER — Other Ambulatory Visit (HOSPITAL_COMMUNITY): Payer: Medicare Other

## 2014-05-20 DIAGNOSIS — M2041 Other hammer toe(s) (acquired), right foot: Secondary | ICD-10-CM | POA: Diagnosis not present

## 2014-05-20 DIAGNOSIS — M2042 Other hammer toe(s) (acquired), left foot: Secondary | ICD-10-CM | POA: Diagnosis not present

## 2014-05-20 DIAGNOSIS — B351 Tinea unguium: Secondary | ICD-10-CM | POA: Diagnosis not present

## 2014-05-20 DIAGNOSIS — M79674 Pain in right toe(s): Secondary | ICD-10-CM | POA: Diagnosis not present

## 2014-05-24 ENCOUNTER — Ambulatory Visit (HOSPITAL_COMMUNITY)
Admission: RE | Admit: 2014-05-24 | Discharge: 2014-05-24 | Disposition: A | Discharge status: Home / Self Care | Payer: Medicare Other | Source: Ambulatory Visit | Attending: Nurse Practitioner | Admitting: Nurse Practitioner

## 2014-05-24 DIAGNOSIS — Z9884 Bariatric surgery status: Secondary | ICD-10-CM | POA: Insufficient documentation

## 2014-05-24 DIAGNOSIS — K224 Dyskinesia of esophagus: Secondary | ICD-10-CM | POA: Diagnosis not present

## 2014-05-24 DIAGNOSIS — K219 Gastro-esophageal reflux disease without esophagitis: Secondary | ICD-10-CM | POA: Diagnosis not present

## 2014-05-24 DIAGNOSIS — R1310 Dysphagia, unspecified: Secondary | ICD-10-CM | POA: Insufficient documentation

## 2014-06-10 ENCOUNTER — Encounter: Payer: Self-pay | Admitting: Nurse Practitioner

## 2014-06-10 ENCOUNTER — Ambulatory Visit (INDEPENDENT_AMBULATORY_CARE_PROVIDER_SITE_OTHER): Payer: Medicare Other | Admitting: Nurse Practitioner

## 2014-06-10 VITALS — BP 157/76 | HR 69 | Temp 97.6°F | Ht 65.0 in | Wt 226.0 lb

## 2014-06-10 DIAGNOSIS — D638 Anemia in other chronic diseases classified elsewhere: Secondary | ICD-10-CM | POA: Diagnosis not present

## 2014-06-10 DIAGNOSIS — R1010 Upper abdominal pain, unspecified: Secondary | ICD-10-CM | POA: Insufficient documentation

## 2014-06-10 DIAGNOSIS — R1310 Dysphagia, unspecified: Secondary | ICD-10-CM

## 2014-06-10 DIAGNOSIS — R1030 Lower abdominal pain, unspecified: Secondary | ICD-10-CM | POA: Diagnosis not present

## 2014-06-10 NOTE — Patient Instructions (Signed)
1. Continue to follow your diet as you have been 2. We will see you back in 6 months to keep an eye on your swallowing and diet. 3. Enjoy working outside in the yard!  : )

## 2014-06-10 NOTE — Progress Notes (Signed)
Referring Provider: [name], MD Primary Care Physician:  [name], MD Primary GI:  Dr. Oneida Alar  Chief Complaint  Patient presents with  . Abdominal Pain    HPI:   74 year old female presents for complaint of abdominal pain. Was last seen in our office 05/10/2014 for dysphagia. Diagnostic upper GI series was ordered which showed status post gastric bypass anastomosis, no obstruction, no ulceration, mild acid reflux, and age-related dysmotility. Patient has a history of noncompliance with gastric bypass diet. Last EGD and colonoscopy performed 11/03/2013. The EGD showed dysphagia most likely due to sliding gastric pouch and nonadherence to gastric bypass diet, mild nonerosive gastritis. Colonoscopy showed normal mucosa and terminal ileum, 13 colon polyps removed, moderate diverticulosis, redundant left colon, small internal hemorrhoids. Was recommended to continue omeprazole, continue following a gastric bypass diet, increased fiber supplement, follow-up on pathology. Colon polyp pathology showed multiple fragments tubular adenoma, ascending colon polyp was sessile serrated polyp/adenoma, and additional tubular adenoma. Gastric biopsy showed no evidence of H. pylori. Commended repeat colonoscopy in 10 years with an overtube.  Today she states she's doing better. Has been following her gastric bypass diet better. Has been chopping her meats and following recommendations. Admits some mild abdominal soreness occasionally but it is generally not bothersome and is tolerable with self-resolution. Denies N/V, hematochezia, melena, recent change in bowel patterns, appetite. Denies any other upper upper or lower GI symptoms.  Past Medical History  Diagnosis Date  . PONV (postoperative nausea and vomiting)   . Hypertension   . Depression   . Seasonal allergies   . Pneumonia   . Anxiety   . GERD (gastroesophageal reflux disease)   . H/O hiatal hernia   . Arthritis   . Anemia   . Obesity     . Sepsis 10/02/2013    Past Surgical History  Procedure Laterality Date  . Back surgery      lumbar x2 by Dr Arnoldo Morale  . Tonsillectomy    . Appendectomy    . Abdominal hysterectomy    . Cholecystectomy    . Joint replacement Right 1995    hip  . Joint replacement Left 2008    hip  . Medial partial knee replacement Left     Dr Ronnie Derby  . Shoulder arthroscopy Right   . Shoulder arthroscopy Right     x2  . Cardiac catheterization  1990's    "okay"  . Total shoulder arthroplasty Right 02/16/2013    Procedure: TOTAL SHOULDER ARTHROPLASTY;  Surgeon: Marybelle Killings, MD;  Location: [place];  Service: Orthopedics;  Laterality: Right;  Right Total Shoulder Arthroplasty, Cemented  . Cervical fusion    . Cataract extraction w/phaco Left 08/06/2013    Procedure: CATARACT EXTRACTION PHACO AND INTRAOCULAR LENS PLACEMENT LEFT EYE;  Surgeon: Tonny Branch, MD;  [facility]: [facility];  Service: Ophthalmology;  Laterality: Left;  CDE: 9.55  . Cataract extraction w/phaco Right 08/24/2013    Procedure: CATARACT EXTRACTION PHACO AND INTRAOCULAR LENS PLACEMENT (IOC);  Surgeon: Tonny Branch, MD;  Location: [facility];  Service: Ophthalmology;  Laterality: Right;  CDE:8.30  . Eye surgery    . Paraesophageal hernia repair  SEP 2010 DR. MCNATT  . Colonoscopy N/A 11/03/2013    SLF: 1. Normal mucosa in the terminal ileum. 2. 13 colon polyps removed. 3. Moderate diverticulosis in the descending colon and sigmoid colon 4. the left colon is redundant 5. Small internal  hemorrhoids.  . Esophagogastroduodenoscopy N/A 11/03/2013    SLF: 1. Dysphagia most  likely due to sliding gastic pouch and non- adherence to gastric bypass diet 2. Mild Non-erosive gastritis  . Hiatal hernia repair      [facility] in 2011  . Carpal tunnel release Right 03/2014    Dr. Lorin Mercy  . Gastric bypass  1979    revision in 1980    Current Outpatient Prescriptions  Medication Sig Dispense Refill  . Calcium Citrate-Vitamin D (CITRACAL PETITES/VITAMIN D) 200-250  MG-UNIT TABS Take 1 tablet by mouth daily.    . diazepam (VALIUM) 5 MG tablet Take 5 mg by mouth 3 (three) times daily as needed. For anxiety    . diphenhydrAMINE (BENADRYL) 25 MG tablet Take 25 mg by mouth every 6 (six) hours as needed for allergies.    Marland Kitchen docusate sodium (COLACE) 100 MG capsule Take 100 mg by mouth at bedtime.     Marland Kitchen HYDROcodone-acetaminophen (NORCO) 10-325 MG per tablet Take 1 tablet by mouth every 6 (six) hours as needed for moderate pain.    . iron polysaccharides (NIFEREX) 150 MG capsule Take 1 capsule (150 mg total) by mouth 2 (two) times daily. 60 capsule 1  . omeprazole (PRILOSEC) 20 MG capsule Take 20 mg by mouth 2 (two) times daily.    . pantoprazole (PROTONIX) 40 MG tablet Take 1 tablet (40 mg total) by mouth daily. 30 tablet 0  . potassium chloride (K-DUR) 10 MEQ tablet Take 10 mEq by mouth 3 (three) times daily with meals.     . potassium chloride (MICRO-K) 10 MEQ CR capsule     . sertraline (ZOLOFT) 100 MG tablet Take 100 mg by mouth daily.    . traZODone (DESYREL) 50 MG tablet Take 50 mg by mouth at bedtime.     No current facility-administered medications for this visit.    Allergies as of 06/10/2014 - Review Complete 06/10/2014  Allergen Reaction Noted  . Novocain [procaine hcl]  11/20/2011  . Other Hives 11/20/2011  . Latex Rash 07/22/2013  . Penicillins Rash 11/20/2011  . Sulfa antibiotics Rash 11/20/2011    Family History  Problem Relation Age of Onset  . Colon cancer Brother     IN HIS 60s-METASTATIC  . Hypertension Brother   . Colon polyps Paternal Grandfather   . Breast cancer Mother   . Hypertension Father     History   Social History  . Marital Status: Single    Spouse Name: N/A  . Number of Children: N/A  . Years of Education: N/A   Social History Main Topics  . Smoking status: Never Smoker   . Smokeless tobacco: Never Used  . Alcohol Use: No  . Drug Use: No  . Sexual Activity: Yes    Birth Control/ Protection: Surgical    Other Topics Concern  . None   Social History Narrative    Review of Systems: 10 point ROS negative except for as noted in HPI.Marland Kitchen  Physical Exam: BP 157/76 mmHg  Pulse 69  Temp(Src) 97.6 F (36.4 C) (Oral)  Ht 5\' 5"  (1.651 m)  Wt 226 lb (102.513 kg)  BMI 37.61 kg/m2 General:   Obese female. Alert and oriented. No distress noted. Pleasant and cooperative.  Head:  Normocephalic and atraumatic. Lungs:  Clear to auscultation bilaterally. No wheezes, rales, or rhonchi. No distress.  Heart:  S1, S2 present without murmurs, rubs, or gallops. Regular rate and rhythm. Abdomen:  +BS, soft, and non-distended. Mild generalized abdominal TTP. No rebound or guarding. No HSM or masses noted. Extremities:  Without edema. Neurologic:  Alert and  oriented x4;  grossly normal neurologically. Skin:  Intact without significant lesions or rashes. Psych:  Alert and cooperative. Normal mood and affect.    06/10/2014 9:48 AM

## 2014-06-18 NOTE — Assessment & Plan Note (Signed)
Abdominal pain much improved probably due to improve compliance of the gastric bypass diet, chopping her meats appropriately, and following other recommendations. Still remains of occasional mild abdominal soreness but this is generally not bothersome and is tolerable for her with self resolution of symptoms shortly after they began. Denies nausea vomiting, hematochezia, melena, and any other red flag/warning signs or symptoms. We'll continue to follow, return to clinic in 6 months for further evaluation of symptoms.

## 2014-06-18 NOTE — Assessment & Plan Note (Signed)
Dysphagia improved symptomatically with improved diet and compliance with chopping her meats and chewing appropriately. We'll continue to monitor her dysphagia and intervene as necessary. Return for follow-up in 6 months further evaluation of symptoms or sooner if needed.

## 2014-06-18 NOTE — Assessment & Plan Note (Signed)
Anemia stable and being followed by her PCP. She is due for a CBC drawn with her PCP in the coming weeks and she will requested these be faxed to [place]. Continue to monitor for any changes intervene as necessary. Return for follow-up in 6 months for further evaluation.

## 2014-06-24 DIAGNOSIS — G5601 Carpal tunnel syndrome, right upper limb: Secondary | ICD-10-CM | POA: Diagnosis not present

## 2014-06-24 NOTE — Progress Notes (Signed)
cc'ed to pcp °

## 2014-06-29 DIAGNOSIS — Z96611 Presence of right artificial shoulder joint: Secondary | ICD-10-CM | POA: Diagnosis not present

## 2014-06-29 DIAGNOSIS — M6281 Muscle weakness (generalized): Secondary | ICD-10-CM | POA: Diagnosis not present

## 2014-06-29 DIAGNOSIS — M25511 Pain in right shoulder: Secondary | ICD-10-CM | POA: Diagnosis not present

## 2014-06-30 DIAGNOSIS — M25511 Pain in right shoulder: Secondary | ICD-10-CM | POA: Diagnosis not present

## 2014-06-30 DIAGNOSIS — M1990 Unspecified osteoarthritis, unspecified site: Secondary | ICD-10-CM | POA: Diagnosis not present

## 2014-06-30 DIAGNOSIS — Z96611 Presence of right artificial shoulder joint: Secondary | ICD-10-CM | POA: Diagnosis not present

## 2014-06-30 DIAGNOSIS — N189 Chronic kidney disease, unspecified: Secondary | ICD-10-CM | POA: Diagnosis not present

## 2014-06-30 DIAGNOSIS — E876 Hypokalemia: Secondary | ICD-10-CM | POA: Diagnosis not present

## 2014-06-30 DIAGNOSIS — M6281 Muscle weakness (generalized): Secondary | ICD-10-CM | POA: Diagnosis not present

## 2014-06-30 DIAGNOSIS — K219 Gastro-esophageal reflux disease without esophagitis: Secondary | ICD-10-CM | POA: Diagnosis not present

## 2014-06-30 DIAGNOSIS — I10 Essential (primary) hypertension: Secondary | ICD-10-CM | POA: Diagnosis not present

## 2014-07-05 DIAGNOSIS — Z96611 Presence of right artificial shoulder joint: Secondary | ICD-10-CM | POA: Diagnosis not present

## 2014-07-05 DIAGNOSIS — M1990 Unspecified osteoarthritis, unspecified site: Secondary | ICD-10-CM | POA: Diagnosis not present

## 2014-07-05 DIAGNOSIS — F419 Anxiety disorder, unspecified: Secondary | ICD-10-CM | POA: Diagnosis not present

## 2014-07-05 DIAGNOSIS — K219 Gastro-esophageal reflux disease without esophagitis: Secondary | ICD-10-CM | POA: Diagnosis not present

## 2014-07-05 DIAGNOSIS — N189 Chronic kidney disease, unspecified: Secondary | ICD-10-CM | POA: Diagnosis not present

## 2014-07-05 DIAGNOSIS — I10 Essential (primary) hypertension: Secondary | ICD-10-CM | POA: Diagnosis not present

## 2014-07-05 DIAGNOSIS — F328 Other depressive episodes: Secondary | ICD-10-CM | POA: Diagnosis not present

## 2014-07-05 DIAGNOSIS — M6281 Muscle weakness (generalized): Secondary | ICD-10-CM | POA: Diagnosis not present

## 2014-07-05 DIAGNOSIS — M25511 Pain in right shoulder: Secondary | ICD-10-CM | POA: Diagnosis not present

## 2014-07-05 DIAGNOSIS — E876 Hypokalemia: Secondary | ICD-10-CM | POA: Diagnosis not present

## 2014-07-05 DIAGNOSIS — Z1389 Encounter for screening for other disorder: Secondary | ICD-10-CM | POA: Diagnosis not present

## 2014-07-07 DIAGNOSIS — Z96611 Presence of right artificial shoulder joint: Secondary | ICD-10-CM | POA: Diagnosis not present

## 2014-07-07 DIAGNOSIS — M25511 Pain in right shoulder: Secondary | ICD-10-CM | POA: Diagnosis not present

## 2014-07-07 DIAGNOSIS — M6281 Muscle weakness (generalized): Secondary | ICD-10-CM | POA: Diagnosis not present

## 2014-07-14 DIAGNOSIS — Z96611 Presence of right artificial shoulder joint: Secondary | ICD-10-CM | POA: Diagnosis not present

## 2014-07-14 DIAGNOSIS — M25511 Pain in right shoulder: Secondary | ICD-10-CM | POA: Diagnosis not present

## 2014-07-14 DIAGNOSIS — M6281 Muscle weakness (generalized): Secondary | ICD-10-CM | POA: Diagnosis not present

## 2014-07-15 DIAGNOSIS — R1110 Vomiting, unspecified: Secondary | ICD-10-CM | POA: Diagnosis not present

## 2014-07-15 DIAGNOSIS — R1310 Dysphagia, unspecified: Secondary | ICD-10-CM | POA: Diagnosis not present

## 2014-07-16 DIAGNOSIS — R05 Cough: Secondary | ICD-10-CM | POA: Diagnosis not present

## 2014-07-16 DIAGNOSIS — J0181 Other acute recurrent sinusitis: Secondary | ICD-10-CM | POA: Diagnosis not present

## 2014-07-28 DIAGNOSIS — M25511 Pain in right shoulder: Secondary | ICD-10-CM | POA: Diagnosis not present

## 2014-07-28 DIAGNOSIS — Z96611 Presence of right artificial shoulder joint: Secondary | ICD-10-CM | POA: Diagnosis not present

## 2014-07-28 DIAGNOSIS — M6281 Muscle weakness (generalized): Secondary | ICD-10-CM | POA: Diagnosis not present

## 2014-07-29 DIAGNOSIS — M6281 Muscle weakness (generalized): Secondary | ICD-10-CM | POA: Diagnosis not present

## 2014-07-29 DIAGNOSIS — Z96611 Presence of right artificial shoulder joint: Secondary | ICD-10-CM | POA: Diagnosis not present

## 2014-07-29 DIAGNOSIS — M25511 Pain in right shoulder: Secondary | ICD-10-CM | POA: Diagnosis not present

## 2014-08-03 DIAGNOSIS — M6281 Muscle weakness (generalized): Secondary | ICD-10-CM | POA: Diagnosis not present

## 2014-08-03 DIAGNOSIS — Z96611 Presence of right artificial shoulder joint: Secondary | ICD-10-CM | POA: Diagnosis not present

## 2014-08-03 DIAGNOSIS — M25511 Pain in right shoulder: Secondary | ICD-10-CM | POA: Diagnosis not present

## 2014-08-05 DIAGNOSIS — Z96611 Presence of right artificial shoulder joint: Secondary | ICD-10-CM | POA: Diagnosis not present

## 2014-08-05 DIAGNOSIS — M6281 Muscle weakness (generalized): Secondary | ICD-10-CM | POA: Diagnosis not present

## 2014-08-05 DIAGNOSIS — M25511 Pain in right shoulder: Secondary | ICD-10-CM | POA: Diagnosis not present

## 2014-08-10 DIAGNOSIS — Z96611 Presence of right artificial shoulder joint: Secondary | ICD-10-CM | POA: Diagnosis not present

## 2014-08-10 DIAGNOSIS — M6281 Muscle weakness (generalized): Secondary | ICD-10-CM | POA: Diagnosis not present

## 2014-08-10 DIAGNOSIS — M25511 Pain in right shoulder: Secondary | ICD-10-CM | POA: Diagnosis not present

## 2014-08-11 DIAGNOSIS — I739 Peripheral vascular disease, unspecified: Secondary | ICD-10-CM | POA: Diagnosis not present

## 2014-08-12 DIAGNOSIS — M6281 Muscle weakness (generalized): Secondary | ICD-10-CM | POA: Diagnosis not present

## 2014-08-12 DIAGNOSIS — Z96611 Presence of right artificial shoulder joint: Secondary | ICD-10-CM | POA: Diagnosis not present

## 2014-08-12 DIAGNOSIS — M25511 Pain in right shoulder: Secondary | ICD-10-CM | POA: Diagnosis not present

## 2014-08-18 DIAGNOSIS — R1314 Dysphagia, pharyngoesophageal phase: Secondary | ICD-10-CM | POA: Diagnosis not present

## 2014-08-18 DIAGNOSIS — D509 Iron deficiency anemia, unspecified: Secondary | ICD-10-CM | POA: Diagnosis not present

## 2014-08-18 DIAGNOSIS — F329 Major depressive disorder, single episode, unspecified: Secondary | ICD-10-CM | POA: Diagnosis not present

## 2014-08-18 DIAGNOSIS — E669 Obesity, unspecified: Secondary | ICD-10-CM | POA: Diagnosis not present

## 2014-08-18 DIAGNOSIS — K449 Diaphragmatic hernia without obstruction or gangrene: Secondary | ICD-10-CM | POA: Diagnosis not present

## 2014-08-18 DIAGNOSIS — I10 Essential (primary) hypertension: Secondary | ICD-10-CM | POA: Diagnosis not present

## 2014-08-18 DIAGNOSIS — Z6835 Body mass index (BMI) 35.0-35.9, adult: Secondary | ICD-10-CM | POA: Diagnosis not present

## 2014-08-18 DIAGNOSIS — K219 Gastro-esophageal reflux disease without esophagitis: Secondary | ICD-10-CM | POA: Diagnosis not present

## 2014-08-18 DIAGNOSIS — R1310 Dysphagia, unspecified: Secondary | ICD-10-CM | POA: Diagnosis not present

## 2014-08-18 DIAGNOSIS — K2270 Barrett's esophagus without dysplasia: Secondary | ICD-10-CM | POA: Diagnosis not present

## 2014-08-23 DIAGNOSIS — Z961 Presence of intraocular lens: Secondary | ICD-10-CM | POA: Diagnosis not present

## 2014-08-23 DIAGNOSIS — H3531 Nonexudative age-related macular degeneration: Secondary | ICD-10-CM | POA: Diagnosis not present

## 2014-08-26 DIAGNOSIS — M25511 Pain in right shoulder: Secondary | ICD-10-CM | POA: Diagnosis not present

## 2014-08-26 DIAGNOSIS — S40011A Contusion of right shoulder, initial encounter: Secondary | ICD-10-CM | POA: Diagnosis not present

## 2014-08-26 DIAGNOSIS — G5601 Carpal tunnel syndrome, right upper limb: Secondary | ICD-10-CM | POA: Diagnosis not present

## 2014-08-30 ENCOUNTER — Other Ambulatory Visit: Payer: Self-pay

## 2014-09-16 DIAGNOSIS — R300 Dysuria: Secondary | ICD-10-CM | POA: Diagnosis not present

## 2014-10-04 HISTORY — PX: ESOPHAGOGASTRODUODENOSCOPY: SHX1529

## 2014-10-05 DIAGNOSIS — F329 Major depressive disorder, single episode, unspecified: Secondary | ICD-10-CM | POA: Diagnosis not present

## 2014-10-05 DIAGNOSIS — Z9884 Bariatric surgery status: Secondary | ICD-10-CM | POA: Diagnosis not present

## 2014-10-05 DIAGNOSIS — Z884 Allergy status to anesthetic agent status: Secondary | ICD-10-CM | POA: Diagnosis not present

## 2014-10-05 DIAGNOSIS — K225 Diverticulum of esophagus, acquired: Secondary | ICD-10-CM | POA: Diagnosis not present

## 2014-10-05 DIAGNOSIS — Z880 Allergy status to penicillin: Secondary | ICD-10-CM | POA: Diagnosis not present

## 2014-10-05 DIAGNOSIS — K2270 Barrett's esophagus without dysplasia: Secondary | ICD-10-CM | POA: Diagnosis not present

## 2014-10-05 DIAGNOSIS — D509 Iron deficiency anemia, unspecified: Secondary | ICD-10-CM | POA: Diagnosis not present

## 2014-10-05 DIAGNOSIS — Z888 Allergy status to other drugs, medicaments and biological substances status: Secondary | ICD-10-CM | POA: Diagnosis not present

## 2014-10-05 DIAGNOSIS — Z79899 Other long term (current) drug therapy: Secondary | ICD-10-CM | POA: Diagnosis not present

## 2014-10-05 DIAGNOSIS — K219 Gastro-esophageal reflux disease without esophagitis: Secondary | ICD-10-CM | POA: Diagnosis not present

## 2014-10-05 DIAGNOSIS — E669 Obesity, unspecified: Secondary | ICD-10-CM | POA: Diagnosis not present

## 2014-10-05 DIAGNOSIS — K449 Diaphragmatic hernia without obstruction or gangrene: Secondary | ICD-10-CM | POA: Diagnosis not present

## 2014-10-05 DIAGNOSIS — Z91040 Latex allergy status: Secondary | ICD-10-CM | POA: Diagnosis not present

## 2014-10-05 DIAGNOSIS — I10 Essential (primary) hypertension: Secondary | ICD-10-CM | POA: Diagnosis not present

## 2014-10-07 DIAGNOSIS — K649 Unspecified hemorrhoids: Secondary | ICD-10-CM | POA: Diagnosis not present

## 2014-10-07 DIAGNOSIS — N189 Chronic kidney disease, unspecified: Secondary | ICD-10-CM | POA: Diagnosis not present

## 2014-10-07 DIAGNOSIS — F419 Anxiety disorder, unspecified: Secondary | ICD-10-CM | POA: Diagnosis not present

## 2014-10-07 DIAGNOSIS — M1990 Unspecified osteoarthritis, unspecified site: Secondary | ICD-10-CM | POA: Diagnosis not present

## 2014-10-07 DIAGNOSIS — F328 Other depressive episodes: Secondary | ICD-10-CM | POA: Diagnosis not present

## 2014-10-07 DIAGNOSIS — K219 Gastro-esophageal reflux disease without esophagitis: Secondary | ICD-10-CM | POA: Diagnosis not present

## 2014-10-07 DIAGNOSIS — I10 Essential (primary) hypertension: Secondary | ICD-10-CM | POA: Diagnosis not present

## 2014-10-20 DIAGNOSIS — M2041 Other hammer toe(s) (acquired), right foot: Secondary | ICD-10-CM | POA: Diagnosis not present

## 2014-10-20 DIAGNOSIS — M79674 Pain in right toe(s): Secondary | ICD-10-CM | POA: Diagnosis not present

## 2014-10-20 DIAGNOSIS — B351 Tinea unguium: Secondary | ICD-10-CM | POA: Diagnosis not present

## 2014-10-20 DIAGNOSIS — M79671 Pain in right foot: Secondary | ICD-10-CM | POA: Diagnosis not present

## 2014-11-17 DIAGNOSIS — L03031 Cellulitis of right toe: Secondary | ICD-10-CM | POA: Diagnosis not present

## 2014-11-17 DIAGNOSIS — M79674 Pain in right toe(s): Secondary | ICD-10-CM | POA: Diagnosis not present

## 2014-11-17 DIAGNOSIS — M79671 Pain in right foot: Secondary | ICD-10-CM | POA: Diagnosis not present

## 2014-11-17 DIAGNOSIS — L600 Ingrowing nail: Secondary | ICD-10-CM | POA: Diagnosis not present

## 2014-12-01 DIAGNOSIS — M79671 Pain in right foot: Secondary | ICD-10-CM | POA: Diagnosis not present

## 2014-12-01 DIAGNOSIS — L03031 Cellulitis of right toe: Secondary | ICD-10-CM | POA: Diagnosis not present

## 2014-12-01 DIAGNOSIS — L600 Ingrowing nail: Secondary | ICD-10-CM | POA: Diagnosis not present

## 2014-12-01 DIAGNOSIS — M79674 Pain in right toe(s): Secondary | ICD-10-CM | POA: Diagnosis not present

## 2014-12-03 ENCOUNTER — Telehealth: Payer: Self-pay

## 2014-12-03 NOTE — Telephone Encounter (Signed)
Pt called to cancel her appointment for Wednesday and also stated that she would not be coming back to [place]. She is going to [place] now.

## 2014-12-03 NOTE — Progress Notes (Signed)
REVIEWED-NO ADDITIONAL RECOMMENDATIONS. 

## 2014-12-03 NOTE — Telephone Encounter (Signed)
REVIEWED-NO ADDITIONAL RECOMMENDATIONS. 

## 2014-12-03 NOTE — Telephone Encounter (Signed)
I will cancel any future appointments

## 2014-12-08 ENCOUNTER — Ambulatory Visit: Payer: Medicare Other | Admitting: Gastroenterology

## 2014-12-14 DIAGNOSIS — M47816 Spondylosis without myelopathy or radiculopathy, lumbar region: Secondary | ICD-10-CM | POA: Diagnosis not present

## 2014-12-14 DIAGNOSIS — Z6841 Body Mass Index (BMI) 40.0 and over, adult: Secondary | ICD-10-CM | POA: Diagnosis not present

## 2014-12-14 DIAGNOSIS — I10 Essential (primary) hypertension: Secondary | ICD-10-CM | POA: Diagnosis not present

## 2014-12-14 DIAGNOSIS — M4806 Spinal stenosis, lumbar region: Secondary | ICD-10-CM | POA: Diagnosis not present

## 2014-12-15 DIAGNOSIS — Z6841 Body Mass Index (BMI) 40.0 and over, adult: Secondary | ICD-10-CM | POA: Diagnosis not present

## 2014-12-15 DIAGNOSIS — I10 Essential (primary) hypertension: Secondary | ICD-10-CM | POA: Diagnosis not present

## 2014-12-15 DIAGNOSIS — M47816 Spondylosis without myelopathy or radiculopathy, lumbar region: Secondary | ICD-10-CM | POA: Diagnosis not present

## 2014-12-26 DIAGNOSIS — Z96652 Presence of left artificial knee joint: Secondary | ICD-10-CM | POA: Diagnosis not present

## 2014-12-26 DIAGNOSIS — R0781 Pleurodynia: Secondary | ICD-10-CM | POA: Diagnosis not present

## 2014-12-26 DIAGNOSIS — I10 Essential (primary) hypertension: Secondary | ICD-10-CM | POA: Diagnosis not present

## 2014-12-26 DIAGNOSIS — Z79899 Other long term (current) drug therapy: Secondary | ICD-10-CM | POA: Diagnosis not present

## 2014-12-26 DIAGNOSIS — R1011 Right upper quadrant pain: Secondary | ICD-10-CM | POA: Diagnosis not present

## 2014-12-26 DIAGNOSIS — R05 Cough: Secondary | ICD-10-CM | POA: Diagnosis not present

## 2014-12-26 DIAGNOSIS — Z96643 Presence of artificial hip joint, bilateral: Secondary | ICD-10-CM | POA: Diagnosis not present

## 2014-12-26 DIAGNOSIS — B349 Viral infection, unspecified: Secondary | ICD-10-CM | POA: Diagnosis not present

## 2014-12-26 DIAGNOSIS — W109XXA Fall (on) (from) unspecified stairs and steps, initial encounter: Secondary | ICD-10-CM | POA: Diagnosis not present

## 2014-12-26 DIAGNOSIS — R509 Fever, unspecified: Secondary | ICD-10-CM | POA: Diagnosis not present

## 2014-12-26 DIAGNOSIS — M25511 Pain in right shoulder: Secondary | ICD-10-CM | POA: Diagnosis not present

## 2014-12-26 DIAGNOSIS — S20211A Contusion of right front wall of thorax, initial encounter: Secondary | ICD-10-CM | POA: Diagnosis not present

## 2014-12-26 DIAGNOSIS — S299XXA Unspecified injury of thorax, initial encounter: Secondary | ICD-10-CM | POA: Diagnosis not present

## 2015-01-04 DIAGNOSIS — I10 Essential (primary) hypertension: Secondary | ICD-10-CM | POA: Diagnosis not present

## 2015-01-04 DIAGNOSIS — Z23 Encounter for immunization: Secondary | ICD-10-CM | POA: Diagnosis not present

## 2015-01-04 DIAGNOSIS — D649 Anemia, unspecified: Secondary | ICD-10-CM | POA: Diagnosis not present

## 2015-02-06 DIAGNOSIS — I10 Essential (primary) hypertension: Secondary | ICD-10-CM | POA: Diagnosis not present

## 2015-02-06 DIAGNOSIS — K219 Gastro-esophageal reflux disease without esophagitis: Secondary | ICD-10-CM | POA: Diagnosis not present

## 2015-02-06 DIAGNOSIS — R05 Cough: Secondary | ICD-10-CM | POA: Diagnosis not present

## 2015-02-06 DIAGNOSIS — J157 Pneumonia due to Mycoplasma pneumoniae: Secondary | ICD-10-CM | POA: Diagnosis not present

## 2015-02-06 DIAGNOSIS — J189 Pneumonia, unspecified organism: Secondary | ICD-10-CM | POA: Diagnosis not present

## 2015-02-06 DIAGNOSIS — R300 Dysuria: Secondary | ICD-10-CM | POA: Diagnosis not present

## 2015-02-06 DIAGNOSIS — R197 Diarrhea, unspecified: Secondary | ICD-10-CM | POA: Diagnosis not present

## 2015-02-06 DIAGNOSIS — Z79899 Other long term (current) drug therapy: Secondary | ICD-10-CM | POA: Diagnosis not present

## 2015-02-15 DIAGNOSIS — J189 Pneumonia, unspecified organism: Secondary | ICD-10-CM | POA: Diagnosis not present

## 2015-02-17 DIAGNOSIS — M7552 Bursitis of left shoulder: Secondary | ICD-10-CM | POA: Diagnosis not present

## 2015-02-17 DIAGNOSIS — M47812 Spondylosis without myelopathy or radiculopathy, cervical region: Secondary | ICD-10-CM | POA: Diagnosis not present

## 2015-03-31 ENCOUNTER — Ambulatory Visit (INDEPENDENT_AMBULATORY_CARE_PROVIDER_SITE_OTHER): Payer: Medicare Other | Admitting: Obstetrics & Gynecology

## 2015-03-31 ENCOUNTER — Encounter: Payer: Self-pay | Admitting: Obstetrics & Gynecology

## 2015-03-31 VITALS — BP 140/70 | HR 66 | Wt 230.0 lb

## 2015-03-31 DIAGNOSIS — L900 Lichen sclerosus et atrophicus: Secondary | ICD-10-CM | POA: Diagnosis not present

## 2015-03-31 DIAGNOSIS — B372 Candidiasis of skin and nail: Secondary | ICD-10-CM

## 2015-03-31 MED ORDER — NYSTATIN-TRIAMCINOLONE 100000-0.1 UNIT/GM-% EX OINT: 1.0000 "application " | TOPICAL_OINTMENT | Freq: Two times a day (BID) | CUTANEOUS | Status: DC

## 2015-03-31 MED ORDER — FLUCONAZOLE 100 MG PO TABS: 100.0000 mg | ORAL_TABLET | Freq: Every day | ORAL | Status: DC

## 2015-03-31 NOTE — Progress Notes (Signed)
Patient ID: [name] [name], female   DOB: [birth year], 75 y.o.   MRN: [id number]      Chief Complaint  Patient presents with  . gyn visit    think yeast, vaginal area and between leg    Blood pressure 140/70, pulse 66, weight 230 lb (104.327 kg).  75 y.o. No obstetric history on file. No LMP recorded. Patient has had a hysterectomy. The current method of family planning is post menopausal status.  Subjective Pt with vulvar itching irritation mionimal dischartge burning  Several weeks worsening  Objective Vulva:  normal appearing vulva with no masses, tenderness or lesions, vulvar erythema  Vagina:  atrophic Cervix:   Uterus:   Adnexa: ovaries:,      Pertinent ROS No burning with urination, frequency or urgency No nausea, vomiting or diarrhea Nor fever chills or other constitutional symptoms   Labs or studies     Impression Diagnoses this Encounter::   ICD-9-CM ICD-10-CM   1. Intertriginous candidiasis 112.3 B37.2   2. 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 lisinopril (PRINIVIL,ZESTRIL) 10 MG tablet    Sig: Take 10 mg by mouth daily.  . fluconazole (DIFLUCAN) 100 MG tablet    Sig: Take 1 tablet (100 mg total) by mouth daily.    Dispense:  7 tablet    Refill:  0  . nystatin-triamcinolone ointment (MYCOLOG)    Sig: Apply 1 application topically 2 (two) times daily.    Dispense:  30 g    Refill:  11    Labs or Scans Ordered: No orders of the defined types were placed in this encounter.    Management::   Follow up Return in about 2 weeks (around 04/14/2015) for Follow up, with Dr Elonda Husky.         All questions were answered.

## 2015-04-04 DIAGNOSIS — D649 Anemia, unspecified: Secondary | ICD-10-CM | POA: Diagnosis not present

## 2015-04-04 DIAGNOSIS — I10 Essential (primary) hypertension: Secondary | ICD-10-CM | POA: Diagnosis not present

## 2015-04-04 DIAGNOSIS — E876 Hypokalemia: Secondary | ICD-10-CM | POA: Diagnosis not present

## 2015-04-04 DIAGNOSIS — K219 Gastro-esophageal reflux disease without esophagitis: Secondary | ICD-10-CM | POA: Diagnosis not present

## 2015-04-04 DIAGNOSIS — N189 Chronic kidney disease, unspecified: Secondary | ICD-10-CM | POA: Diagnosis not present

## 2015-04-04 DIAGNOSIS — F419 Anxiety disorder, unspecified: Secondary | ICD-10-CM | POA: Diagnosis not present

## 2015-04-07 DIAGNOSIS — F419 Anxiety disorder, unspecified: Secondary | ICD-10-CM | POA: Diagnosis not present

## 2015-04-07 DIAGNOSIS — M1990 Unspecified osteoarthritis, unspecified site: Secondary | ICD-10-CM | POA: Diagnosis not present

## 2015-04-07 DIAGNOSIS — J189 Pneumonia, unspecified organism: Secondary | ICD-10-CM | POA: Diagnosis not present

## 2015-04-07 DIAGNOSIS — I10 Essential (primary) hypertension: Secondary | ICD-10-CM | POA: Diagnosis not present

## 2015-04-07 DIAGNOSIS — N189 Chronic kidney disease, unspecified: Secondary | ICD-10-CM | POA: Diagnosis not present

## 2015-04-07 DIAGNOSIS — D649 Anemia, unspecified: Secondary | ICD-10-CM | POA: Diagnosis not present

## 2015-04-12 DIAGNOSIS — Z1231 Encounter for screening mammogram for malignant neoplasm of breast: Secondary | ICD-10-CM | POA: Diagnosis not present

## 2015-04-14 ENCOUNTER — Encounter: Payer: Self-pay | Admitting: Obstetrics & Gynecology

## 2015-04-14 ENCOUNTER — Ambulatory Visit (INDEPENDENT_AMBULATORY_CARE_PROVIDER_SITE_OTHER): Payer: Medicare Other | Admitting: Obstetrics & Gynecology

## 2015-04-14 VITALS — BP 160/90 | HR 80 | Wt 227.0 lb

## 2015-04-14 DIAGNOSIS — L900 Lichen sclerosus et atrophicus: Secondary | ICD-10-CM

## 2015-04-14 MED ORDER — FLUOCINONIDE 0.05 % EX CREA: 1.0000 "application " | TOPICAL_CREAM | Freq: Two times a day (BID) | CUTANEOUS | Status: DC

## 2015-04-14 NOTE — Progress Notes (Signed)
Patient ID: [name] [name], female   DOB: January 29, 1941, 75 y.o.   MRN: [id number]      Chief Complaint  Patient presents with  . Follow-up    Blood pressure 160/90, pulse 80, weight 227 lb (102.967 kg).  75 y.o. No obstetric history on file. No LMP recorded. Patient has had a hysterectomy. The current method of family planning is status post hysterectomy.  Subjective Pt feels much better after the therapy for the yeast, much less overall itching in the areas involved She still does have lichen sclerosus on exam  Objective Lichen sclerosus  Pertinent ROS   Labs or studies     Impression Diagnoses this Encounter::   [id number]   1. 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 fluocinonide cream (LIDEX) 0.05 %    Sig: Apply 1 application topically 2 (two) times daily.    Dispense:  30 g    Refill:  11    Labs or Scans Ordered: No orders of the defined types were placed in this encounter.    Management:: Use twice daily decrease to daily if improves  Follow up  Return in about 3 months (around 07/12/2015) for Follow up, with Dr Elonda Husky.        Face to face time:  15 minutes  Greater than 50% of the visit time was spent in counseling and coordination of care with the patient.  The summary and outline of the counseling and care coordination is summarized in the note above.   All questions were answered.

## 2015-04-18 ENCOUNTER — Telehealth: Payer: Self-pay | Admitting: Obstetrics & Gynecology

## 2015-04-18 NOTE — Telephone Encounter (Signed)
Pt states started using Lidex cream for vaginal area, used gloves, and washed hands after application but is now having drainage from eyes and redness on eyelids.   Could this be a side from the Lidex?

## 2015-04-18 NOTE — Telephone Encounter (Signed)
Pt informed Dr.Eure doesn't think symptoms are from pt using the Lidex unless she got some of the medication in her eyes, sounds like Conjunctivitis (pink eye). Pt states she has an appt with her PCP tomorrow.

## 2015-04-18 NOTE — Telephone Encounter (Signed)
dont think so, not unless she got in her eye mistalkenly, sounds like conjunctivitis(pink eye)

## 2015-04-19 DIAGNOSIS — H10501 Unspecified blepharoconjunctivitis, right eye: Secondary | ICD-10-CM | POA: Diagnosis not present

## 2015-05-05 DIAGNOSIS — M19012 Primary osteoarthritis, left shoulder: Secondary | ICD-10-CM | POA: Diagnosis not present

## 2015-05-23 DIAGNOSIS — Z961 Presence of intraocular lens: Secondary | ICD-10-CM | POA: Diagnosis not present

## 2015-05-23 DIAGNOSIS — H40013 Open angle with borderline findings, low risk, bilateral: Secondary | ICD-10-CM | POA: Diagnosis not present

## 2015-05-23 DIAGNOSIS — H353131 Nonexudative age-related macular degeneration, bilateral, early dry stage: Secondary | ICD-10-CM | POA: Diagnosis not present

## 2015-05-23 DIAGNOSIS — H26493 Other secondary cataract, bilateral: Secondary | ICD-10-CM | POA: Diagnosis not present

## 2015-05-29 DIAGNOSIS — K219 Gastro-esophageal reflux disease without esophagitis: Secondary | ICD-10-CM | POA: Diagnosis not present

## 2015-05-29 DIAGNOSIS — R1013 Epigastric pain: Secondary | ICD-10-CM | POA: Diagnosis not present

## 2015-05-29 DIAGNOSIS — Z9049 Acquired absence of other specified parts of digestive tract: Secondary | ICD-10-CM | POA: Diagnosis not present

## 2015-05-29 DIAGNOSIS — Z79899 Other long term (current) drug therapy: Secondary | ICD-10-CM | POA: Diagnosis not present

## 2015-05-29 DIAGNOSIS — Z96643 Presence of artificial hip joint, bilateral: Secondary | ICD-10-CM | POA: Diagnosis not present

## 2015-05-29 DIAGNOSIS — R109 Unspecified abdominal pain: Secondary | ICD-10-CM | POA: Diagnosis not present

## 2015-05-29 DIAGNOSIS — K2900 Acute gastritis without bleeding: Secondary | ICD-10-CM | POA: Diagnosis not present

## 2015-05-29 DIAGNOSIS — Z9884 Bariatric surgery status: Secondary | ICD-10-CM | POA: Diagnosis not present

## 2015-05-29 DIAGNOSIS — Z96652 Presence of left artificial knee joint: Secondary | ICD-10-CM | POA: Diagnosis not present

## 2015-05-29 DIAGNOSIS — K2970 Gastritis, unspecified, without bleeding: Secondary | ICD-10-CM | POA: Diagnosis not present

## 2015-05-29 DIAGNOSIS — I10 Essential (primary) hypertension: Secondary | ICD-10-CM | POA: Diagnosis not present

## 2015-05-29 DIAGNOSIS — R05 Cough: Secondary | ICD-10-CM | POA: Diagnosis not present

## 2015-05-29 DIAGNOSIS — Z96611 Presence of right artificial shoulder joint: Secondary | ICD-10-CM | POA: Diagnosis not present

## 2015-06-08 DIAGNOSIS — M1990 Unspecified osteoarthritis, unspecified site: Secondary | ICD-10-CM | POA: Diagnosis not present

## 2015-06-08 DIAGNOSIS — F419 Anxiety disorder, unspecified: Secondary | ICD-10-CM | POA: Diagnosis not present

## 2015-06-08 DIAGNOSIS — F330 Major depressive disorder, recurrent, mild: Secondary | ICD-10-CM | POA: Diagnosis not present

## 2015-06-08 DIAGNOSIS — N189 Chronic kidney disease, unspecified: Secondary | ICD-10-CM | POA: Diagnosis not present

## 2015-06-08 DIAGNOSIS — C44319 Basal cell carcinoma of skin of other parts of face: Secondary | ICD-10-CM | POA: Diagnosis not present

## 2015-06-08 DIAGNOSIS — E876 Hypokalemia: Secondary | ICD-10-CM | POA: Diagnosis not present

## 2015-06-08 DIAGNOSIS — I10 Essential (primary) hypertension: Secondary | ICD-10-CM | POA: Diagnosis not present

## 2015-06-08 DIAGNOSIS — K219 Gastro-esophageal reflux disease without esophagitis: Secondary | ICD-10-CM | POA: Diagnosis not present

## 2015-06-08 DIAGNOSIS — C4491 Basal cell carcinoma of skin, unspecified: Secondary | ICD-10-CM | POA: Diagnosis not present

## 2015-06-29 DIAGNOSIS — L570 Actinic keratosis: Secondary | ICD-10-CM | POA: Diagnosis not present

## 2015-06-29 DIAGNOSIS — Z85828 Personal history of other malignant neoplasm of skin: Secondary | ICD-10-CM | POA: Diagnosis not present

## 2015-06-29 DIAGNOSIS — L309 Dermatitis, unspecified: Secondary | ICD-10-CM | POA: Diagnosis not present

## 2015-07-12 ENCOUNTER — Ambulatory Visit: Payer: Medicare Other | Admitting: Obstetrics & Gynecology

## 2015-07-12 DIAGNOSIS — R05 Cough: Secondary | ICD-10-CM | POA: Diagnosis not present

## 2015-07-12 DIAGNOSIS — R042 Hemoptysis: Secondary | ICD-10-CM | POA: Diagnosis not present

## 2015-07-12 DIAGNOSIS — J309 Allergic rhinitis, unspecified: Secondary | ICD-10-CM | POA: Diagnosis not present

## 2015-07-14 ENCOUNTER — Emergency Department (HOSPITAL_COMMUNITY): Payer: Medicare Other

## 2015-07-14 ENCOUNTER — Inpatient Hospital Stay (HOSPITAL_COMMUNITY)
Admission: EM | Admit: 2015-07-14 | Discharge: 2015-07-21 | DRG: 194 | Disposition: A | Discharge status: Home / Self Care | Payer: Medicare Other | Attending: Internal Medicine | Admitting: Internal Medicine

## 2015-07-14 ENCOUNTER — Encounter (HOSPITAL_COMMUNITY): Payer: Self-pay | Admitting: Emergency Medicine

## 2015-07-14 DIAGNOSIS — E876 Hypokalemia: Secondary | ICD-10-CM | POA: Diagnosis present

## 2015-07-14 DIAGNOSIS — Z800 Family history of malignant neoplasm of digestive organs: Secondary | ICD-10-CM

## 2015-07-14 DIAGNOSIS — Z9884 Bariatric surgery status: Secondary | ICD-10-CM

## 2015-07-14 DIAGNOSIS — K219 Gastro-esophageal reflux disease without esophagitis: Secondary | ICD-10-CM | POA: Diagnosis present

## 2015-07-14 DIAGNOSIS — I10 Essential (primary) hypertension: Secondary | ICD-10-CM | POA: Diagnosis not present

## 2015-07-14 DIAGNOSIS — J189 Pneumonia, unspecified organism: Secondary | ICD-10-CM | POA: Diagnosis not present

## 2015-07-14 DIAGNOSIS — R05 Cough: Secondary | ICD-10-CM | POA: Diagnosis not present

## 2015-07-14 DIAGNOSIS — R0602 Shortness of breath: Secondary | ICD-10-CM | POA: Diagnosis not present

## 2015-07-14 DIAGNOSIS — Z96652 Presence of left artificial knee joint: Secondary | ICD-10-CM | POA: Diagnosis present

## 2015-07-14 DIAGNOSIS — Z6841 Body Mass Index (BMI) 40.0 and over, adult: Secondary | ICD-10-CM

## 2015-07-14 DIAGNOSIS — Z6838 Body mass index (BMI) 38.0-38.9, adult: Secondary | ICD-10-CM

## 2015-07-14 DIAGNOSIS — Z8249 Family history of ischemic heart disease and other diseases of the circulatory system: Secondary | ICD-10-CM

## 2015-07-14 DIAGNOSIS — N179 Acute kidney failure, unspecified: Secondary | ICD-10-CM | POA: Diagnosis present

## 2015-07-14 DIAGNOSIS — Z96643 Presence of artificial hip joint, bilateral: Secondary | ICD-10-CM | POA: Diagnosis present

## 2015-07-14 DIAGNOSIS — E66812 Obesity, class 2: Secondary | ICD-10-CM | POA: Diagnosis present

## 2015-07-14 DIAGNOSIS — Z803 Family history of malignant neoplasm of breast: Secondary | ICD-10-CM

## 2015-07-14 DIAGNOSIS — I8393 Asymptomatic varicose veins of bilateral lower extremities: Secondary | ICD-10-CM | POA: Diagnosis present

## 2015-07-14 DIAGNOSIS — E860 Dehydration: Secondary | ICD-10-CM | POA: Diagnosis not present

## 2015-07-14 DIAGNOSIS — Z96611 Presence of right artificial shoulder joint: Secondary | ICD-10-CM | POA: Diagnosis present

## 2015-07-14 LAB — CBC
HCT: 40.4 % (ref 36.0–46.0)
Hemoglobin: 13 g/dL (ref 12.0–15.0)
MCH: 30.7 pg (ref 26.0–34.0)
MCHC: 32.2 g/dL (ref 30.0–36.0)
MCV: 95.5 fL (ref 78.0–100.0)
Platelets: 124 10*3/uL — ABNORMAL LOW (ref 150–400)
RBC: 4.23 MIL/uL (ref 3.87–5.11)
RDW: 14.9 % (ref 11.5–15.5)
WBC: 7.4 10*3/uL (ref 4.0–10.5)

## 2015-07-14 LAB — COMPREHENSIVE METABOLIC PANEL
ALT: 14 U/L (ref 14–54)
AST: 18 U/L (ref 15–41)
Albumin: 3.6 g/dL (ref 3.5–5.0)
Alkaline Phosphatase: 97 U/L (ref 38–126)
Anion gap: 10 (ref 5–15)
BUN: 21 mg/dL — ABNORMAL HIGH (ref 6–20)
CO2: 24 mmol/L (ref 22–32)
Calcium: 8.3 mg/dL — ABNORMAL LOW (ref 8.9–10.3)
Chloride: 105 mmol/L (ref 101–111)
Creatinine, Ser: 1.4 mg/dL — ABNORMAL HIGH (ref 0.44–1.00)
GFR calc Af Amer: 42 mL/min — ABNORMAL LOW (ref >60)
GFR calc non Af Amer: 36 mL/min — ABNORMAL LOW (ref >60)
Glucose, Bld: 131 mg/dL — ABNORMAL HIGH (ref 65–99)
Potassium: 3.3 mmol/L — ABNORMAL LOW (ref 3.5–5.1)
Sodium: 139 mmol/L (ref 135–145)
Total Bilirubin: 0.4 mg/dL (ref 0.3–1.2)
Total Protein: 6.6 g/dL (ref 6.5–8.1)

## 2015-07-14 LAB — DIFFERENTIAL
Basophils Absolute: 0 10*3/uL (ref 0.0–0.1)
Basophils Relative: 0 %
Eosinophils Absolute: 0.1 10*3/uL (ref 0.0–0.7)
Eosinophils Relative: 1 %
Lymphocytes Relative: 4 %
Lymphs Abs: 0.3 10*3/uL — ABNORMAL LOW (ref 0.7–4.0)
Monocytes Absolute: 0.9 10*3/uL (ref 0.1–1.0)
Monocytes Relative: 12 %
Neutro Abs: 5.9 10*3/uL (ref 1.7–7.7)
Neutrophils Relative %: 83 %

## 2015-07-14 LAB — LACTIC ACID, PLASMA: Lactic Acid, Venous: 1.6 mmol/L (ref 0.5–2.0)

## 2015-07-14 MED ORDER — DIAZEPAM 5 MG PO TABS
5.0000 mg | ORAL_TABLET | Freq: Three times a day (TID) | ORAL | Status: DC | PRN
  Administered 2015-07-16 – 2015-07-18 (×2): 5 mg via ORAL
  Filled 2015-07-14 (×2): qty 1

## 2015-07-14 MED ORDER — SERTRALINE HCL 50 MG PO TABS
100.0000 mg | ORAL_TABLET | Freq: Every day | ORAL | Status: DC
  Administered 2015-07-14 – 2015-07-21 (×8): 100 mg via ORAL
  Filled 2015-07-14 (×8): qty 2

## 2015-07-14 MED ORDER — TRAZODONE HCL 50 MG PO TABS
50.0000 mg | ORAL_TABLET | Freq: Every day | ORAL | Status: DC
  Administered 2015-07-14 – 2015-07-20 (×7): 50 mg via ORAL
  Filled 2015-07-14 (×7): qty 1

## 2015-07-14 MED ORDER — ONDANSETRON HCL 4 MG/2ML IJ SOLN: 4.0000 mg | Freq: Four times a day (QID) | INTRAMUSCULAR | Status: DC | PRN

## 2015-07-14 MED ORDER — POTASSIUM CHLORIDE CRYS ER 20 MEQ PO TBCR
40.0000 meq | EXTENDED_RELEASE_TABLET | Freq: Every day | ORAL | Status: DC
  Administered 2015-07-14 – 2015-07-15 (×2): 40 meq via ORAL
  Filled 2015-07-14 (×2): qty 2

## 2015-07-14 MED ORDER — CALCIUM CARBONATE-VITAMIN D 500-200 MG-UNIT PO TABS
1.0000 | ORAL_TABLET | Freq: Every day | ORAL | Status: DC
  Administered 2015-07-14 – 2015-07-21 (×8): 1 via ORAL
  Filled 2015-07-14 (×15): qty 1

## 2015-07-14 MED ORDER — LEVOFLOXACIN IN D5W 750 MG/150ML IV SOLN
750.0000 mg | INTRAVENOUS | Status: DC
  Administered 2015-07-16: 750 mg via INTRAVENOUS
  Filled 2015-07-14: qty 150

## 2015-07-14 MED ORDER — DOCUSATE SODIUM 100 MG PO CAPS
100.0000 mg | ORAL_CAPSULE | Freq: Every day | ORAL | Status: DC
  Administered 2015-07-15 – 2015-07-20 (×6): 100 mg via ORAL
  Filled 2015-07-14 (×7): qty 1

## 2015-07-14 MED ORDER — PANTOPRAZOLE SODIUM 40 MG PO TBEC
40.0000 mg | DELAYED_RELEASE_TABLET | Freq: Every day | ORAL | Status: DC
  Administered 2015-07-14 – 2015-07-19 (×6): 40 mg via ORAL
  Filled 2015-07-14 (×6): qty 1

## 2015-07-14 MED ORDER — LEVOFLOXACIN IN D5W 750 MG/150ML IV SOLN
750.0000 mg | Freq: Once | INTRAVENOUS | Status: AC
  Administered 2015-07-14: 750 mg via INTRAVENOUS
  Filled 2015-07-14: qty 150

## 2015-07-14 MED ORDER — ALBUTEROL SULFATE (2.5 MG/3ML) 0.083% IN NEBU: 2.5000 mg | INHALATION_SOLUTION | RESPIRATORY_TRACT | Status: DC | PRN

## 2015-07-14 MED ORDER — LEVALBUTEROL HCL 1.25 MG/0.5ML IN NEBU
1.2500 mg | INHALATION_SOLUTION | Freq: Once | RESPIRATORY_TRACT | Status: AC
  Administered 2015-07-14: 1.25 mg via RESPIRATORY_TRACT
  Filled 2015-07-14: qty 0.5

## 2015-07-14 MED ORDER — ONDANSETRON HCL 4 MG PO TABS
4.0000 mg | ORAL_TABLET | Freq: Four times a day (QID) | ORAL | Status: DC | PRN
  Administered 2015-07-14 – 2015-07-19 (×2): 4 mg via ORAL
  Filled 2015-07-14 (×2): qty 1

## 2015-07-14 MED ORDER — ACETAMINOPHEN 650 MG RE SUPP
650.0000 mg | Freq: Four times a day (QID) | RECTAL | Status: DC | PRN
Start: 2015-07-14 — End: 2015-07-21

## 2015-07-14 MED ORDER — ALBUTEROL SULFATE (2.5 MG/3ML) 0.083% IN NEBU
2.5000 mg | INHALATION_SOLUTION | Freq: Four times a day (QID) | RESPIRATORY_TRACT | Status: DC
  Administered 2015-07-14 – 2015-07-21 (×25): 2.5 mg via RESPIRATORY_TRACT
  Filled 2015-07-14 (×26): qty 3

## 2015-07-14 MED ORDER — ACETAMINOPHEN 325 MG PO TABS
650.0000 mg | ORAL_TABLET | Freq: Four times a day (QID) | ORAL | Status: DC | PRN
  Administered 2015-07-17 – 2015-07-20 (×2): 650 mg via ORAL
  Filled 2015-07-14 (×2): qty 2

## 2015-07-14 MED ORDER — SODIUM CHLORIDE 0.9 % IV BOLUS (SEPSIS)
1000.0000 mL | Freq: Once | INTRAVENOUS | Status: AC
  Administered 2015-07-14: 1000 mL via INTRAVENOUS

## 2015-07-14 MED ORDER — ENOXAPARIN SODIUM 40 MG/0.4ML ~~LOC~~ SOLN
40.0000 mg | SUBCUTANEOUS | Status: DC
  Administered 2015-07-14 – 2015-07-21 (×8): 40 mg via SUBCUTANEOUS
  Filled 2015-07-14 (×8): qty 0.4

## 2015-07-14 MED ORDER — HYDROCODONE-ACETAMINOPHEN 10-325 MG PO TABS
1.0000 | ORAL_TABLET | Freq: Four times a day (QID) | ORAL | Status: DC | PRN
  Administered 2015-07-14 – 2015-07-21 (×12): 1 via ORAL
  Filled 2015-07-14 (×12): qty 1

## 2015-07-14 MED ORDER — LISINOPRIL 10 MG PO TABS
10.0000 mg | ORAL_TABLET | Freq: Every day | ORAL | Status: DC
  Administered 2015-07-14 – 2015-07-17 (×4): 10 mg via ORAL
  Filled 2015-07-14 (×4): qty 1

## 2015-07-14 MED ORDER — SODIUM CHLORIDE 0.9 % IV SOLN
INTRAVENOUS | Status: DC
  Administered 2015-07-14 – 2015-07-15 (×3): via INTRAVENOUS

## 2015-07-14 NOTE — ED Notes (Signed)
Unable to obtain continuous pulse ox due to temp of pt's fingers.  Spot check 96%.

## 2015-07-14 NOTE — ED Notes (Signed)
Pt reports cough since Monday, intermittent fever. Pt reports saw PCP on Tuesday and had chest xray completed. Pt reports chest xray was negative for pneumonia. Pt reports last took tylenol x2 hours ago.

## 2015-07-14 NOTE — ED Notes (Signed)
Pt placed on 2liters 02. Pt room air saturation in room 90%. Pt tolerated well. Pt also placed on cardiac monitor at this time.

## 2015-07-14 NOTE — ED Notes (Signed)
Pt states her hand is burning where Levaquin was infusing.  Redness noted to hand, pt denies itching.  Pt states she has had Levaquin IV and PO before with no reaction.  Will continue to monitor.

## 2015-07-14 NOTE — ED Notes (Signed)
Pt ambulatory to bathroom

## 2015-07-14 NOTE — ED Provider Notes (Signed)
CSN: [id number]     Arrival date & time 07/14/15  [id number] History   First MD Initiated Contact with Patient 07/14/15 [phone]     Chief Complaint  Patient presents with  . Cough     (Consider location/radiation/quality/duration/timing/severity/associated sxs/prior Treatment) The history is provided by the patient.   UDELL HASPEL is a 75 y.o. female with a history presenting with A four-day history of cough which has become productive of a pink tinged sputum in association with intermittent fever, with MAXIMUM TEMPERATURE of 103.0 last night which she treated with Tylenol prior to arrival.  She was seen by her PCP 2 days ago at which time her chest x-ray was clear.  She was given a shot of a steroid medication as she also endorses wheezing.  However she has felt worse rather than better with generalized fatigue and increasing shortness of breath.  She denies chest pain except with localized left sided pain with coughing.  She reports a history of pneumonia requiring hospitalization in the past.  She denies any recent hospitalizations.    Past Medical History  Diagnosis Date  . PONV (postoperative nausea and vomiting)   . Hypertension   . Depression   . Seasonal allergies   . Pneumonia   . Anxiety   . GERD (gastroesophageal reflux disease)   . H/O hiatal hernia   . Arthritis   . Anemia   . Obesity   . Sepsis ([place]) 10/02/2013   Past Surgical History  Procedure Laterality Date  . Back surgery      lumbar x2 by Dr Arnoldo Morale  . Tonsillectomy    . Appendectomy    . Abdominal hysterectomy    . Cholecystectomy    . Joint replacement Right 1995    hip  . Joint replacement Left 2008    hip  . Medial partial knee replacement Left     Dr Ronnie Derby  . Shoulder arthroscopy Right   . Shoulder arthroscopy Right     x2  . Cardiac catheterization  1990's    "okay"  . Total shoulder arthroplasty Right 02/16/2013    Procedure: TOTAL SHOULDER ARTHROPLASTY;  Surgeon: Marybelle Killings, MD;  Location: [facility];  Service: Orthopedics;  Laterality: Right;  Right Total Shoulder Arthroplasty, Cemented  . Cervical fusion    . Cataract extraction w/phaco Left 08/06/2013    Procedure: CATARACT EXTRACTION PHACO AND INTRAOCULAR LENS PLACEMENT LEFT EYE;  Surgeon: Tonny Branch, MD;  Location: [facility];  Service: Ophthalmology;  Laterality: Left;  CDE: 9.55  . Cataract extraction w/phaco Right 08/24/2013    Procedure: CATARACT EXTRACTION PHACO AND INTRAOCULAR LENS PLACEMENT (IOC);  Surgeon: Tonny Branch, MD;  Location: [facility];  Service: Ophthalmology;  Laterality: Right;  CDE:8.30  . Eye surgery    . Paraesophageal hernia repair  SEP 2010 DR. MCNATT  . Colonoscopy N/A 11/03/2013    SLF: 1. Normal mucosa in the terminal ileum. 2. 13 colon polyps removed. 3. Moderate diverticulosis in the descending colon and sigmoid colon 4. the left colon is redundant 5. Small internal  hemorrhoids.  . Esophagogastroduodenoscopy N/A 11/03/2013    SLF: 1. 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 Mild Non-erosive gastritis  . Hiatal hernia repair      [facility] in 2011  . Carpal tunnel release Right 03/2014    Dr. Lorin Mercy  . Gastric bypass  1979    revision in 1980   Family History  Problem  Relation Age of Onset  . Colon cancer Brother     IN HIS 60s-METASTATIC  . Hypertension Brother   . Colon polyps Paternal Grandfather   . Breast cancer Mother   . Hypertension Father    Social History  Substance Use Topics  . Smoking status: Never Smoker   . Smokeless tobacco: Never Used  . Alcohol Use: No   OB History    No data available     Review of Systems  Constitutional: Positive for fever.  HENT: Negative for congestion and sore throat.   Eyes: Negative.   Respiratory: Positive for cough, chest tightness, shortness of breath and wheezing.   Cardiovascular: Negative for chest pain.  Gastrointestinal: Negative for nausea, vomiting and abdominal pain.  Genitourinary: Negative.    Musculoskeletal: Negative for joint swelling, arthralgias and neck pain.  Skin: Negative.  Negative for rash and wound.  Neurological: Positive for weakness. Negative for dizziness, light-headedness, numbness and headaches.  Psychiatric/Behavioral: Negative.       Allergies  Novocain; Other; Latex; Penicillins; and Sulfa antibiotics  Home Medications   Prior to Admission medications   Medication Sig Start Date End Date Taking? Authorizing Provider  Calcium Citrate-Vitamin D (CITRACAL PETITES/VITAMIN D) 200-250 MG-UNIT TABS Take 1 tablet by mouth daily.    Historical Provider, MD  diazepam (VALIUM) 5 MG tablet Take 5 mg by mouth 3 (three) times daily as needed. For anxiety    Historical Provider, MD  diphenhydrAMINE (BENADRYL) 25 MG tablet Take 25 mg by mouth every 6 (six) hours as needed for allergies.    Historical Provider, MD  docusate sodium (COLACE) 100 MG capsule Take 100 mg by mouth at bedtime.     Historical Provider, MD  fluocinonide cream (LIDEX) [id number] % Apply 1 application topically 2 (two) times daily. 04/14/15   Florian Buff, MD  HYDROcodone-acetaminophen (NORCO) 10-325 MG per tablet Take 1 tablet by mouth every 6 (six) hours as needed for moderate pain.    Historical Provider, MD  iron polysaccharides (NIFEREX) 150 MG capsule Take 1 capsule (150 mg total) by mouth 2 (two) times daily. 10/03/13   Kathie Dike, MD  lisinopril (PRINIVIL,ZESTRIL) 10 MG tablet Take 10 mg by mouth daily.    Historical Provider, MD  nystatin-triamcinolone ointment (MYCOLOG) Apply 1 application topically 2 (two) times daily. 03/31/15   Florian Buff, MD  omeprazole (PRILOSEC) 20 MG capsule Take 20 mg by mouth 2 (two) times daily.    Historical Provider, MD  potassium chloride (K-DUR) 10 MEQ tablet Take 10 mEq by mouth 3 (three) times daily with meals.     Historical Provider, MD  sertraline (ZOLOFT) 100 MG tablet Take 100 mg by mouth daily.    Historical Provider, MD  traZODone (DESYREL) 50 MG tablet  Take 50 mg by mouth at bedtime.    Historical Provider, MD   BP 125/60 mmHg  Pulse 99  Temp(Src) 99.4 F (37.4 C) (Oral)  Resp 21  Ht 5\' 3"  (1.6 m)  Wt 104.781 kg  BMI 40.93 kg/m2  SpO2 96% Physical Exam  Constitutional: She appears well-developed and well-nourished.  HENT:  Head: Normocephalic and atraumatic.  Eyes: Conjunctivae are normal.  Neck: Normal range of motion.  Cardiovascular: Normal rate, regular rhythm, normal heart sounds and intact distal pulses.   Pulmonary/Chest: Effort normal. She has wheezes in the left upper field and the left middle field. She has rhonchi in the left middle field and the left lower field.  Abdominal: Soft. Bowel sounds  are normal. There is no tenderness.  Musculoskeletal: Normal range of motion.  Neurological: She is alert.  Skin: Skin is warm and dry.  Psychiatric: She has a normal mood and affect.  Nursing note and vitals reviewed.   ED Course  Procedures (including critical care time) Labs Review Labs Reviewed  CBC - Abnormal; Notable for the following:    Platelets 124 (*)    All other components within normal limits  COMPREHENSIVE METABOLIC PANEL - Abnormal; Notable for the following:    Potassium 3.3 (*)    Glucose, Bld 131 (*)    BUN 21 (*)    Creatinine, Ser 1.40 (*)    Calcium 8.3 (*)    GFR calc non Af Amer 36 (*)    GFR calc Af Amer 42 (*)    All other components within normal limits  DIFFERENTIAL - Abnormal; Notable for the following:    Lymphs Abs 0.3 (*)    All other components within normal limits  CULTURE, BLOOD (ROUTINE X 2)  CULTURE, BLOOD (ROUTINE X 2)  LACTIC ACID, PLASMA    Imaging Review Dg Chest 2 View  07/14/2015  CLINICAL DATA:  Shortness of breath and productive cough EXAM: CHEST  2 VIEW COMPARISON:  07/12/2015 FINDINGS: Cardiac shadow is stable. The lungs are well aerated bilaterally. New left basilar infiltrate is seen when compare with the prior study. Postsurgical changes are again noted.  IMPRESSION: New left basilar infiltrate. Electronically Signed   By: Inez Catalina M.D.   On: 07/14/2015 08:56   I have personally reviewed and evaluated these images and lab results as part of my medical decision-making.   EKG Interpretation None      MDM   Final diagnoses:  CAP (community acquired pneumonia)   Patients labs reviewed.  Radiological studies were viewed, interpreted and considered during the medical decision making and disposition process. I agree with radiologists reading.  Results were also discussed with patient. Pt was given IV fluids, xopenex neb tx.  Discussed with Dr. Rogene Houston who also saw pt.    Curb score 2 - 13% mortality, recommended inpt tx.  Will discuss with hospitalists for admission.    Evalee Jefferson, PA-C 07/14/15 [phone]

## 2015-07-14 NOTE — ED Provider Notes (Signed)
Medical screening examination/treatment/procedure(s) were conducted as a shared visit with non-physician practitioner(s) and myself.  I personally evaluated the patient during the encounter.   EKG Interpretation None      Results for orders placed or performed during the hospital encounter of 07/14/15  CBC  Result Value Ref Range   WBC 7.4 4.0 - 10.5 K/uL   RBC 4.23 3.87 - 5.11 MIL/uL   Hemoglobin 13.0 12.0 - 15.0 g/dL   HCT 40.4 36.0 - 46.0 %   MCV 95.5 78.0 - 100.0 fL   MCH 30.7 26.0 - 34.0 pg   MCHC 32.2 30.0 - 36.0 g/dL   RDW 14.9 11.5 - 15.5 %   Platelets 124 (L) 150 - 400 K/uL  Comprehensive metabolic panel  Result Value Ref Range   Sodium 139 135 - 145 mmol/L   Potassium 3.3 (L) 3.5 - 5.1 mmol/L   Chloride 105 101 - 111 mmol/L   CO2 24 22 - 32 mmol/L   Glucose, Bld 131 (H) 65 - 99 mg/dL   BUN 21 (H) 6 - 20 mg/dL   Creatinine, Ser 1.40 (H) 0.44 - 1.00 mg/dL   Calcium 8.3 (L) 8.9 - 10.3 mg/dL   Total Protein 6.6 6.5 - 8.1 g/dL   Albumin 3.6 3.5 - 5.0 g/dL   AST 18 15 - 41 U/L   ALT 14 14 - 54 U/L   Alkaline Phosphatase 97 38 - 126 U/L   Total Bilirubin 0.4 0.3 - 1.2 mg/dL   GFR calc non Af Amer 36 (L) >60 mL/min   GFR calc Af Amer 42 (L) >60 mL/min   Anion gap 10 5 - 15  Differential  Result Value Ref Range   Neutrophils Relative % 83 %   Neutro Abs 5.9 1.7 - 7.7 K/uL   Lymphocytes Relative 4 %   Lymphs Abs 0.3 (L) 0.7 - 4.0 K/uL   Monocytes Relative 12 %   Monocytes Absolute 0.9 0.1 - 1.0 K/uL   Eosinophils Relative 1 %   Eosinophils Absolute 0.1 0.0 - 0.7 K/uL   Basophils Relative 0 %   Basophils Absolute 0.0 0.0 - 0.1 K/uL  Lactic acid, plasma  Result Value Ref Range   Lactic Acid, Venous 1.6 0.5 - 2.0 mmol/L   Dg Chest 2 View  07/14/2015  CLINICAL DATA:  Shortness of breath and productive cough EXAM: CHEST  2 VIEW COMPARISON:  07/12/2015 FINDINGS: Cardiac shadow is stable. The lungs are well aerated bilaterally. New left basilar infiltrate is seen when  compare with the prior study. Postsurgical changes are again noted. IMPRESSION: New left basilar infiltrate. Electronically Signed   By: Inez Catalina M.D.   On: 07/14/2015 08:56    Chest x-ray consistent with the left basilar pneumonia. Patient was significant cough lungs are clear bilaterally oxygen saturations in the low 90s. Patient was seen by her primary care doctor on Tuesday and was given a shot of steroids. Patient daughter breathing problems were due to the pollen however last night her temp went to 102. Patient meets criteria for admission will start IV Levaquin since she has a penicillin allergy. And will arrange admission. Patient is alert and oriented.  Fredia Sorrow, MD 07/14/15 [phone]

## 2015-07-14 NOTE — H&P (Signed)
[facility] History and Physical  [name] [name] [id number] DOB: Nov 27, 1940    PCP:   [name], MD   Chief Complaint: coughs and SOB.   HPI: [name] [name] is an 75 y.o. female with hx of gastric stapling procedure, morbid obesity, HTN, recurrent PNAs,  Anxiety, allergy, presented to the ER with fever, productive coughs, and wheezing.  She had seen her PCP and had a shot (? Steroid), but continued to have problems.  Evaluation in the ER showed CXR with Left basilar PNA and no CHF. She has no leukocytosis, and remained stable hemodynamics.  Her K was slightly low at 3.3 and her Cr was 1.4.  She was given IV Levoquin, along with nebs, and hospitalist was asked to admit her for CAP. She denied ill contact, distant travel, and had no flu like illness.   Rewiew of Systems:  Constitutional: Negative for malaise, fever and chills. No significant weight loss or weight gain Eyes: Negative for eye pain, redness and discharge, diplopia, visual changes, or flashes of light. ENMT: Negative for ear pain, hoarseness, nasal congestion, sinus pressure and sore throat. No headaches; tinnitus, drooling, or problem swallowing. Cardiovascular: Negative for chest pain, palpitations, diaphoresis, dyspnea and peripheral edema. ; No orthopnea, PND Respiratory: Negative for  hemoptysis,  and stridor. No pleuritic chestpain. Gastrointestinal: Negative for nausea, vomiting, diarrhea, constipation, abdominal pain, melena, blood in stool, hematemesis, jaundice and rectal bleeding.    Genitourinary: Negative for frequency, dysuria, incontinence,flank pain and hematuria; Musculoskeletal: Negative for back pain and neck pain. Negative for swelling and trauma.;  Skin: . Negative for pruritus, rash, abrasions, bruising and skin lesion.; ulcerations Neuro: Negative for headache, lightheadedness and neck stiffness. Negative for weakness, altered level of consciousness , altered mental status, extremity weakness,  burning feet, involuntary movement, seizure and syncope.  Psych: negative for anxiety, depression, insomnia, tearfulness, panic attacks, hallucinations, paranoia, suicidal or homicidal ideation    Past Medical History  Diagnosis Date  . PONV (postoperative nausea and vomiting)   . Hypertension   . Depression   . Seasonal allergies   . Pneumonia   . Anxiety   . GERD (gastroesophageal reflux disease)   . H/O hiatal hernia   . Arthritis   . Anemia   . Obesity   . Sepsis ([place]) 10/02/2013    Past Surgical History  Procedure Laterality Date  . Back surgery      lumbar x2 by Dr Arnoldo Morale  . Tonsillectomy    . Appendectomy    . Abdominal hysterectomy    . Cholecystectomy    . Joint replacement Right 1995    hip  . Joint replacement Left 2008    hip  . Medial partial knee replacement Left     Dr Ronnie Derby  . Shoulder arthroscopy Right   . Shoulder arthroscopy Right     x2  . Cardiac catheterization  1990's    "okay"  . Total shoulder arthroplasty Right 02/16/2013    Procedure: TOTAL SHOULDER ARTHROPLASTY;  Surgeon: Marybelle Killings, MD;  Location: [facility];  Service: Orthopedics;  Laterality: Right;  Right Total Shoulder Arthroplasty, Cemented  . Cervical fusion    . Cataract extraction w/phaco Left 08/06/2013    Procedure: CATARACT EXTRACTION PHACO AND INTRAOCULAR LENS PLACEMENT LEFT EYE;  Surgeon: Tonny Branch, MD;  Location: [facility];  Service: Ophthalmology;  Laterality: Left;  CDE: 9.55  . Cataract extraction w/phaco Right 08/24/2013    Procedure: CATARACT EXTRACTION PHACO AND INTRAOCULAR LENS PLACEMENT (IOC);  Surgeon:  Tonny Branch, MD;  Location: [facility];  Service: Ophthalmology;  Laterality: Right;  CDE:8.30  . Eye surgery    . Paraesophageal hernia repair  SEP 2010 DR. MCNATT  . Colonoscopy N/A 11/03/2013    SLF: 1. Normal mucosa in the terminal ileum. 2. 13 colon polyps removed. 3. Moderate diverticulosis in the descending colon and sigmoid colon 4. the left colon is redundant 5. Small  internal  hemorrhoids.  . Esophagogastroduodenoscopy N/A 11/03/2013    SLF: 1. 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 Mild Non-erosive gastritis  . Hiatal hernia repair      [facility] in 2011  . Carpal tunnel release Right 03/2014    Dr. Lorin Mercy  . Gastric bypass  1979    revision in 1980    Medications:  HOME MEDS: Prior to Admission medications   Medication Sig Start Date End Date Taking? Authorizing Provider  Calcium Citrate-Vitamin D (CITRACAL PETITES/VITAMIN D) 200-250 MG-UNIT TABS Take 1 tablet by mouth daily.    Historical Provider, MD  diazepam (VALIUM) 5 MG tablet Take 5 mg by mouth 3 (three) times daily as needed. For anxiety    Historical Provider, MD  diphenhydrAMINE (BENADRYL) 25 MG tablet Take 25 mg by mouth every 6 (six) hours as needed for allergies.    Historical Provider, MD  docusate sodium (COLACE) 100 MG capsule Take 100 mg by mouth at bedtime.     Historical Provider, MD  fluocinonide cream (LIDEX) [id number] % Apply 1 application topically 2 (two) times daily. 04/14/15   Florian Buff, MD  HYDROcodone-acetaminophen (NORCO) 10-325 MG per tablet Take 1 tablet by mouth every 6 (six) hours as needed for moderate pain.    Historical Provider, MD  iron polysaccharides (NIFEREX) 150 MG capsule Take 1 capsule (150 mg total) by mouth 2 (two) times daily. 10/03/13   Kathie Dike, MD  lisinopril (PRINIVIL,ZESTRIL) 10 MG tablet Take 10 mg by mouth daily.    Historical Provider, MD  nystatin-triamcinolone ointment (MYCOLOG) Apply 1 application topically 2 (two) times daily. 03/31/15   Florian Buff, MD  omeprazole (PRILOSEC) 20 MG capsule Take 20 mg by mouth 2 (two) times daily.    Historical Provider, MD  potassium chloride (K-DUR) 10 MEQ tablet Take 10 mEq by mouth 3 (three) times daily with meals.     Historical Provider, MD  sertraline (ZOLOFT) 100 MG tablet Take 100 mg by mouth daily.    Historical Provider, MD  traZODone (DESYREL)  50 MG tablet Take 50 mg by mouth at bedtime.    Historical Provider, MD     Allergies:  Allergies  Allergen Reactions  . Novocain [Procaine Hcl]     Unknown-passed out with novocaine injected for dental surgery.  . Other Hives    EKG pads - need to use pediatric pads  . Latex Rash  . Penicillins Rash  . Sulfa Antibiotics Rash    Social History:   reports that she has never smoked. She has never used smokeless tobacco. She reports that she does not drink alcohol or use illicit drugs.  Family History: Family History  Problem Relation Age of Onset  . Colon cancer Brother     IN HIS 60s-METASTATIC  . Hypertension Brother   . Colon polyps Paternal Grandfather   . Breast cancer Mother   . Hypertension Father      Physical Exam: Filed Vitals:   07/14/15 0830 07/14/15 0900 07/14/15 0930 07/14/15 [id number]  BP: 110/67 130/63 125/60   Pulse:      Temp:      TempSrc:      Resp: 21     Height:      Weight:      SpO2:    96%   Blood pressure 125/60, pulse 99, temperature 99.4 F (37.4 C), temperature source Oral, resp. rate 21, height 5\' 3"  (1.6 m), weight 104.781 kg (231 lb), SpO2 96 %.  GEN:  Pleasant  patient lying in the stretcher in no acute distress; cooperative with exam. PSYCH:  alert and oriented x4; does not appear anxious or depressed; affect is appropriate. HEENT: Mucous membranes pink and anicteric; PERRLA; EOM intact; no cervical lymphadenopathy nor thyromegaly or carotid bruit; no JVD; There were no stridor. Neck is very supple. Breasts:: Not examined CHEST WALL: No tenderness CHEST: Normal respiration, bilateral wheezing with scattered rhonchi. HEART: Regular rate and rhythm.  There are no murmur, rub, or gallops.   BACK: No kyphosis or scoliosis; no CVA tenderness ABDOMEN: soft and non-tender; no masses, no organomegaly, normal abdominal bowel sounds; no pannus; no intertriginous candida. There is no rebound and no distention. Rectal Exam: Not done EXTREMITIES:  No bone or joint deformity; age-appropriate arthropathy of the hands and knees; no edema; no ulcerations.  There is no calf tenderness. Genitalia: not examined PULSES: 2+ and symmetric SKIN: Normal hydration no rash or ulceration CNS: Cranial nerves 2-12 grossly intact no focal lateralizing neurologic deficit.  Speech is fluent; uvula elevated with phonation, facial symmetry and tongue midline. DTR are normal bilaterally, cerebella exam is intact, barbinski is negative and strengths are equaled bilaterally.  No sensory loss.   Labs on Admission:  Basic Metabolic Panel:  Recent Labs Lab 07/14/15 0808  NA 139  K 3.3*  CL 105  CO2 24  GLUCOSE 131*  BUN 21*  CREATININE 1.40*  CALCIUM 8.3*   Liver Function Tests:  Recent Labs Lab 07/14/15 0808  AST 18  ALT 14  ALKPHOS 97  BILITOT 0.4  PROT 6.6  ALBUMIN 3.6   CBC:  Recent Labs Lab 07/14/15 0808  WBC 7.4  NEUTROABS 5.9  HGB 13.0  HCT 40.4  MCV 95.5  PLT 124*   Radiological Exams on Admission: Dg Chest 2 View  07/14/2015  CLINICAL DATA:  Shortness of breath and productive cough EXAM: CHEST  2 VIEW COMPARISON:  07/12/2015 FINDINGS: Cardiac shadow is stable. The lungs are well aerated bilaterally. New left basilar infiltrate is seen when compare with the prior study. Postsurgical changes are again noted. IMPRESSION: New left basilar infiltrate. Electronically Signed   By: Inez Catalina M.D.   On: 07/14/2015 08:56    EKG: Independently reviewed.  Assessment/Plan Present on Admission:  . CAP (community acquired pneumonia) . Hypertension . Morbid obesity ([place])  PLAN:  CAP:  Will continue with IV levoquin.  Will obtain a set of BC. She will benefit getting nebs as she has some wheezing, but will avoid steroid for now.  Continue with oxygen.   HTN:  Will continue with her meds.  BP is acceptable.  Hypokalemia:  Will supplement.  Volume depletion:  Mild.  Will give IVF.   Other plans as per orders. Code Status:FULL  CODE.    Orvan Falconer, MD. FACP [facility] Pager [phone] 7pm to 7am.  07/14/2015, 10:07 AM

## 2015-07-15 ENCOUNTER — Ambulatory Visit: Payer: Medicare Other | Admitting: Obstetrics & Gynecology

## 2015-07-15 DIAGNOSIS — R0602 Shortness of breath: Secondary | ICD-10-CM | POA: Diagnosis not present

## 2015-07-15 DIAGNOSIS — J441 Chronic obstructive pulmonary disease with (acute) exacerbation: Secondary | ICD-10-CM | POA: Diagnosis not present

## 2015-07-15 DIAGNOSIS — Z8249 Family history of ischemic heart disease and other diseases of the circulatory system: Secondary | ICD-10-CM | POA: Diagnosis not present

## 2015-07-15 DIAGNOSIS — K219 Gastro-esophageal reflux disease without esophagitis: Secondary | ICD-10-CM | POA: Diagnosis present

## 2015-07-15 DIAGNOSIS — R05 Cough: Secondary | ICD-10-CM | POA: Diagnosis not present

## 2015-07-15 DIAGNOSIS — Z96643 Presence of artificial hip joint, bilateral: Secondary | ICD-10-CM | POA: Diagnosis present

## 2015-07-15 DIAGNOSIS — I8393 Asymptomatic varicose veins of bilateral lower extremities: Secondary | ICD-10-CM | POA: Diagnosis present

## 2015-07-15 DIAGNOSIS — Z800 Family history of malignant neoplasm of digestive organs: Secondary | ICD-10-CM | POA: Diagnosis not present

## 2015-07-15 DIAGNOSIS — J189 Pneumonia, unspecified organism: Secondary | ICD-10-CM | POA: Diagnosis not present

## 2015-07-15 DIAGNOSIS — N179 Acute kidney failure, unspecified: Secondary | ICD-10-CM | POA: Diagnosis not present

## 2015-07-15 DIAGNOSIS — E860 Dehydration: Secondary | ICD-10-CM | POA: Diagnosis present

## 2015-07-15 DIAGNOSIS — Z6841 Body Mass Index (BMI) 40.0 and over, adult: Secondary | ICD-10-CM | POA: Diagnosis not present

## 2015-07-15 DIAGNOSIS — Z96611 Presence of right artificial shoulder joint: Secondary | ICD-10-CM | POA: Diagnosis present

## 2015-07-15 DIAGNOSIS — E876 Hypokalemia: Secondary | ICD-10-CM | POA: Diagnosis not present

## 2015-07-15 DIAGNOSIS — Z9884 Bariatric surgery status: Secondary | ICD-10-CM | POA: Diagnosis not present

## 2015-07-15 DIAGNOSIS — Z96652 Presence of left artificial knee joint: Secondary | ICD-10-CM | POA: Diagnosis present

## 2015-07-15 DIAGNOSIS — I10 Essential (primary) hypertension: Secondary | ICD-10-CM | POA: Diagnosis not present

## 2015-07-15 DIAGNOSIS — Z803 Family history of malignant neoplasm of breast: Secondary | ICD-10-CM | POA: Diagnosis not present

## 2015-07-15 LAB — BASIC METABOLIC PANEL
Anion gap: 5 (ref 5–15)
BUN: 17 mg/dL (ref 6–20)
CO2: 25 mmol/L (ref 22–32)
Calcium: 7.9 mg/dL — ABNORMAL LOW (ref 8.9–10.3)
Chloride: 106 mmol/L (ref 101–111)
Creatinine, Ser: 1.23 mg/dL — ABNORMAL HIGH (ref 0.44–1.00)
GFR calc Af Amer: 49 mL/min — ABNORMAL LOW (ref >60)
GFR calc non Af Amer: 42 mL/min — ABNORMAL LOW (ref >60)
Glucose, Bld: 125 mg/dL — ABNORMAL HIGH (ref 65–99)
Potassium: 3 mmol/L — ABNORMAL LOW (ref 3.5–5.1)
Sodium: 136 mmol/L (ref 135–145)

## 2015-07-15 LAB — CBC
HCT: 37.6 % (ref 36.0–46.0)
Hemoglobin: 12.1 g/dL (ref 12.0–15.0)
MCH: 30.9 pg (ref 26.0–34.0)
MCHC: 32.2 g/dL (ref 30.0–36.0)
MCV: 96.2 fL (ref 78.0–100.0)
Platelets: 128 10*3/uL — ABNORMAL LOW (ref 150–400)
RBC: 3.91 MIL/uL (ref 3.87–5.11)
RDW: 15.1 % (ref 11.5–15.5)
WBC: 6.7 10*3/uL (ref 4.0–10.5)

## 2015-07-15 MED ORDER — POTASSIUM CHLORIDE CRYS ER 20 MEQ PO TBCR
40.0000 meq | EXTENDED_RELEASE_TABLET | Freq: Four times a day (QID) | ORAL | Status: AC
  Administered 2015-07-15 – 2015-07-16 (×3): 40 meq via ORAL
  Filled 2015-07-15 (×3): qty 2

## 2015-07-15 MED ORDER — POTASSIUM CHLORIDE 10 MEQ/100ML IV SOLN
10.0000 meq | INTRAVENOUS | Status: AC
  Administered 2015-07-15: 10 meq via INTRAVENOUS
  Filled 2015-07-15: qty 100

## 2015-07-15 MED ORDER — POTASSIUM CHLORIDE CRYS ER 20 MEQ PO TBCR: 40.0000 meq | EXTENDED_RELEASE_TABLET | Freq: Four times a day (QID) | ORAL | Status: DC

## 2015-07-15 MED ORDER — PREDNISONE 20 MG PO TABS
20.0000 mg | ORAL_TABLET | Freq: Two times a day (BID) | ORAL | Status: DC
  Administered 2015-07-15 – 2015-07-17 (×4): 20 mg via ORAL
  Filled 2015-07-15 (×4): qty 1

## 2015-07-15 NOTE — Progress Notes (Signed)
[facility] PROGRESS NOTE  [name] [name] [id number] DOB: [birth year]    PCP:   [name], MD   HPI:  [name] [name] is an 75 y.o. female with hx of gastric stapling procedure, morbid obesity, HTN, recurrent PNAs, Anxiety, allergy, presented to the ER with fever, productive coughs, and wheezing. She had seen her PCP and had a shot (? Steroid), but continued to have problems. Evaluation in the ER showed CXR with Left basilar PNA and no CHF. She has no leukocytosis, and remained stable hemodynamics. Her K was slightly low at 3.3 and her Cr was 1.4. She was given IV Levoquin, along with nebs, and hospitalist was asked to admit her for CAP. She denied ill contact, distant travel, and had no flu like illness. She has improved with current tx, but her wheezing was persistent.  Her K was still low at 3.0 mE/L.    Rewiew of Systems:  Constitutional: Negative for malaise, fever and chills. No significant weight loss or weight gain Eyes: Negative for eye pain, redness and discharge, diplopia, visual changes, or flashes of light. ENMT: Negative for ear pain, hoarseness, nasal congestion, sinus pressure and sore throat. No headaches; tinnitus, drooling, or problem swallowing. Cardiovascular: Negative for chest pain, palpitations, diaphoresis, dyspnea and peripheral edema. ; No orthopnea, PND Respiratory: Negative for cough, hemoptysis, wheezing and stridor. No pleuritic chestpain. Gastrointestinal: Negative for nausea, vomiting, diarrhea, constipation, abdominal pain, melena, blood in stool, hematemesis, jaundice and rectal bleeding.    Genitourinary: Negative for frequency, dysuria, incontinence,flank pain and hematuria; Musculoskeletal: Negative for back pain and neck pain. Negative for swelling and trauma.;  Skin: . Negative for pruritus, rash, abrasions, bruising and skin lesion.; ulcerations Neuro: Negative for headache, lightheadedness and neck stiffness. Negative for weakness,  altered level of consciousness , altered mental status, extremity weakness, burning feet, involuntary movement, seizure and syncope.  Psych: negative for anxiety, depression, insomnia, tearfulness, panic attacks, hallucinations, paranoia, suicidal or homicidal ideation   Past Medical History  Diagnosis Date  . PONV (postoperative nausea and vomiting)   . Hypertension   . Depression   . Seasonal allergies   . Pneumonia   . Anxiety   . GERD (gastroesophageal reflux disease)   . H/O hiatal hernia   . Arthritis   . Anemia   . Obesity   . Sepsis ([place]) 10/02/2013    Past Surgical History  Procedure Laterality Date  . Back surgery      lumbar x2 by Dr Arnoldo Morale  . Tonsillectomy    . Appendectomy    . Abdominal hysterectomy    . Cholecystectomy    . Joint replacement Right 1995    hip  . Joint replacement Left 2008    hip  . Medial partial knee replacement Left     Dr Ronnie Derby  . Shoulder arthroscopy Right   . Shoulder arthroscopy Right     x2  . Cardiac catheterization  1990's    "okay"  . Total shoulder arthroplasty Right 02/16/2013    Procedure: TOTAL SHOULDER ARTHROPLASTY;  Surgeon: Marybelle Killings, MD;  Location: [facility];  Service: Orthopedics;  Laterality: Right;  Right Total Shoulder Arthroplasty, Cemented  . Cervical fusion    . Cataract extraction w/phaco Left 08/06/2013    Procedure: CATARACT EXTRACTION PHACO AND INTRAOCULAR LENS PLACEMENT LEFT EYE;  Surgeon: Tonny Branch, MD;  Location: [facility];  Service: Ophthalmology;  Laterality: Left;  CDE: 9.55  . Cataract extraction w/phaco Right 08/24/2013    Procedure: CATARACT EXTRACTION  PHACO AND INTRAOCULAR LENS PLACEMENT (IOC);  Surgeon: Tonny Branch, MD;  Location: [facility];  Service: Ophthalmology;  Laterality: Right;  CDE:8.30  . Eye surgery    . Paraesophageal hernia repair  SEP 2010 DR. MCNATT  . Colonoscopy N/A 11/03/2013    SLF: 1. Normal mucosa in the terminal ileum. 2. 13 colon polyps removed. 3. Moderate diverticulosis in the  descending colon and sigmoid colon 4. the left colon is redundant 5. Small internal  hemorrhoids.  . Esophagogastroduodenoscopy N/A 11/03/2013    SLF: 1. 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 Mild Non-erosive gastritis  . Hiatal hernia repair      [facility] in 2011  . Carpal tunnel release Right 03/2014    Dr. Lorin Mercy  . Gastric bypass  1979    revision in 1980    Medications:  HOME MEDS: Prior to Admission medications   Medication Sig Start Date End Date Taking? Authorizing Provider  acetaminophen (TYLENOL) 325 MG tablet Take 650 mg by mouth every 6 (six) hours as needed for mild pain.   Yes Historical Provider, MD  Calcium Citrate-Vitamin D (CITRACAL PETITES/VITAMIN D) 200-250 MG-UNIT TABS Take 1 tablet by mouth daily.   Yes Historical Provider, MD  dextromethorphan-guaiFENesin (MUCINEX DM) 30-600 MG 12hr tablet Take 1 tablet by mouth 2 (two) times daily as needed for cough.   Yes Historical Provider, MD  diazepam (VALIUM) 5 MG tablet Take 5 mg by mouth 3 (three) times daily as needed. For anxiety   Yes Historical Provider, MD  docusate sodium (COLACE) 100 MG capsule Take 100 mg by mouth at bedtime.    Yes Historical Provider, MD  fluocinonide cream (LIDEX) [id number] % Apply 1 application topically 2 (two) times daily. 04/14/15  Yes Florian Buff, MD  guaifenesin (ROBITUSSIN) 100 MG/5ML syrup Take 200 mg by mouth 3 (three) times daily as needed for cough.   Yes Historical Provider, MD  HYDROcodone-acetaminophen (NORCO) 10-325 MG per tablet Take 1 tablet by mouth every 6 (six) hours as needed for moderate pain.   Yes Historical Provider, MD  iron polysaccharides (NIFEREX) 150 MG capsule Take 1 capsule (150 mg total) by mouth 2 (two) times daily. 10/03/13  Yes Kathie Dike, MD  lisinopril-hydrochlorothiazide (PRINZIDE,ZESTORETIC) 20-12.5 MG tablet 1 tablet daily.  06/16/15  Yes Historical Provider, MD  nystatin-triamcinolone ointment (MYCOLOG)  Apply 1 application topically 2 (two) times daily. 03/31/15  Yes Florian Buff, MD  pantoprazole (PROTONIX) 40 MG tablet 40 mg daily.  06/28/15  Yes Historical Provider, MD  potassium chloride (K-DUR) 10 MEQ tablet Take 10 mEq by mouth 3 (three) times daily with meals.    Yes Historical Provider, MD  sertraline (ZOLOFT) 100 MG tablet Take 100 mg by mouth daily.   Yes Historical Provider, MD  traZODone (DESYREL) 50 MG tablet Take 50 mg by mouth at bedtime.   Yes Historical Provider, MD     Allergies:  Allergies  Allergen Reactions  . Novocain [Procaine Hcl]     Unknown-passed out with novocaine injected for dental surgery.  . Other Hives    EKG pads - need to use pediatric pads  . Latex Rash  . Penicillins Rash  . Sulfa Antibiotics Rash    Social History:   reports that she has never smoked. She has never used smokeless tobacco. She reports that she does not drink alcohol or use illicit drugs.  Family History: Family History  Problem Relation Age of Onset  .  Colon cancer Brother     IN HIS 60s-METASTATIC  . Hypertension Brother   . Colon polyps Paternal Grandfather   . Breast cancer Mother   . Hypertension Father      Physical Exam: Filed Vitals:   07/15/15 0726 07/15/15 0920 07/15/15 1459 07/15/15 1502  BP:  128/75  143/56  Pulse:  84  63  Temp:  97.8 F (36.6 C)  98 F (36.7 C)  TempSrc:  Oral  Oral  Resp:  18  18  Height:      Weight:      SpO2: 92% 95% 91% 100%   Blood pressure 143/56, pulse 63, temperature 98 F (36.7 C), temperature source Oral, resp. rate 18, height 5\' 3"  (1.6 m), weight 104.781 kg (231 lb), SpO2 100 %.  GEN:  Pleasant patient lying in the stretcher in no acute distress; cooperative with exam. PSYCH:  alert and oriented x4; does not appear anxious or depressed; affect is appropriate. HEENT: Mucous membranes pink and anicteric; PERRLA; EOM intact; no cervical lymphadenopathy nor thyromegaly or carotid bruit; no JVD; There were no stridor. Neck  is very supple. Breasts:: Not examined CHEST WALL: No tenderness CHEST: Normal respiration, wheezing with bilateral coarse rhonchi.  HEART: Regular rate and rhythm.  There are no murmur, rub, or gallops.   BACK: No kyphosis or scoliosis; no CVA tenderness ABDOMEN: soft and non-tender; no masses, no organomegaly, normal abdominal bowel sounds; no pannus; no intertriginous candida. There is no rebound and no distention. Rectal Exam: Not done EXTREMITIES: No bone or joint deformity; age-appropriate arthropathy of the hands and knees; no edema; no ulcerations.  There is no calf tenderness. Genitalia: not examined PULSES: 2+ and symmetric SKIN: Normal hydration no rash or ulceration CNS: Cranial nerves 2-12 grossly intact no focal lateralizing neurologic deficit.  Speech is fluent; uvula elevated with phonation, facial symmetry and tongue midline. DTR are normal bilaterally, cerebella exam is intact, barbinski is negative and strengths are equaled bilaterally.  No sensory loss.   Labs on Admission:  Basic Metabolic Panel:  Recent Labs Lab 07/14/15 0808 07/15/15 0514  NA 139 136  K 3.3* 3.0*  CL 105 106  CO2 24 25  GLUCOSE 131* 125*  BUN 21* 17  CREATININE 1.40* 1.23*  CALCIUM 8.3* 7.9*   Liver Function Tests:  Recent Labs Lab 07/14/15 0808  AST 18  ALT 14  ALKPHOS 97  BILITOT 0.4  PROT 6.6  ALBUMIN 3.6   CBC:  Recent Labs Lab 07/14/15 0808 07/15/15 0514  WBC 7.4 6.7  NEUTROABS 5.9  --   HGB 13.0 12.1  HCT 40.4 37.6  MCV 95.5 96.2  PLT 124* 128*    Radiological Exams on Admission: Dg Chest 2 View  07/14/2015  CLINICAL DATA:  Shortness of breath and productive cough EXAM: CHEST  2 VIEW COMPARISON:  07/12/2015 FINDINGS: Cardiac shadow is stable. The lungs are well aerated bilaterally. New left basilar infiltrate is seen when compare with the prior study. Postsurgical changes are again noted. IMPRESSION: New left basilar infiltrate. Electronically Signed   By: Inez Catalina M.D.   On: 07/14/2015 08:56    Assessment/Plan Present on Admission:  . CAP (community acquired pneumonia) . Hypertension . Morbid obesity ([place])  PLAN:  CAP:  Will continue with antibiotic.  She is improving.  Due to her persistent wheezing, will start on oral Prednisone.   Hypokalemia:  Will continue with oral supplement, give 2 runs of IV KCL today.  Recheck in am.  HTN:  Stable.  Obesity:  S/p gastric stapling surgery many years ago.   Other plans as per orders. Code Status: FULL Haskel Khan, MD.  FACP [facility] Pager [phone] 7pm to 7am.  07/15/2015, 4:41 PM

## 2015-07-15 NOTE — Care Management Obs Status (Signed)
[facility] NOTIFICATION   Patient Details  Name: [name] [name] MRN: [id number] Date of Birth: 1940-11-20   Medicare Observation Status Notification Given:  Yes    Alvie Heidelberg, RN 07/15/2015, 4:59 PM

## 2015-07-15 NOTE — Care Management Note (Signed)
Case Management Note  Patient Details  Name: NORITA DAVAULT MRN: [id number] Date of Birth: [birth year]  Subjective/Objective: Patient alert and oriented from home with niece. Patient stated that she has a walker and cane. Denies issues with filling medications or transportation. Has PCP.  No needs assessed at this time will continue to follow.                     Action/Plan: home with self care.   Expected Discharge Date:                  Expected Discharge Plan:  Home/Self Care  In-House Referral:     Discharge planning Services  CM Consult  Post Acute Care Choice:    Choice offered to:     DME Arranged:    DME Agency:     HH Arranged:    HH Agency:     Status of Service:  In process, will continue to follow  Medicare Important Message Given:    Date Medicare IM Given:    Medicare IM give by:    Date Additional Medicare IM Given:    Additional Medicare Important Message give by:     If discussed at [place] of Stay Meetings, dates discussed:    Additional Comments:  Alvie Heidelberg, RN 07/15/2015, 6:30 PM

## 2015-07-15 NOTE — Progress Notes (Signed)
Pt has refused IV potassium. Pt states that it "it is hurting too much". I attempted to reduce the rate but patient still refused IV potassium. MD notified and received verbal order to give 40 mg potassium PO q 6hrs.

## 2015-07-16 LAB — BASIC METABOLIC PANEL
Anion gap: 4 — ABNORMAL LOW (ref 5–15)
BUN: 15 mg/dL (ref 6–20)
CO2: 25 mmol/L (ref 22–32)
Calcium: 8.3 mg/dL — ABNORMAL LOW (ref 8.9–10.3)
Chloride: 109 mmol/L (ref 101–111)
Creatinine, Ser: 0.93 mg/dL (ref 0.44–1.00)
GFR calc Af Amer: >60 mL/min (ref >60)
GFR calc non Af Amer: 59 mL/min — ABNORMAL LOW (ref >60)
Glucose, Bld: 131 mg/dL — ABNORMAL HIGH (ref 65–99)
Potassium: 4.4 mmol/L (ref 3.5–5.1)
Sodium: 138 mmol/L (ref 135–145)

## 2015-07-16 MED ORDER — LEVOFLOXACIN IN D5W 750 MG/150ML IV SOLN
750.0000 mg | INTRAVENOUS | Status: DC
Start: 2015-07-17 — End: 2015-07-18
  Administered 2015-07-17 – 2015-07-18 (×2): 750 mg via INTRAVENOUS
  Filled 2015-07-16 (×2): qty 150

## 2015-07-16 NOTE — Progress Notes (Signed)
[facility] PROGRESS NOTE  LITAL ZAPP [id number] DOB: Sep 27, 1940    PCP:   [name], MD   HPI: [name] [name] is an 75 y.o. female with hx of gastric stapling procedure, morbid obesity, HTN, recurrent PNAs, Anxiety, allergy, presented to the ER with fever, productive coughs, and wheezing. She had seen her PCP and had a shot (? Steroid), but continued to have problems. Evaluation in the ER showed CXR with Left basilar PNA and no CHF. She has no leukocytosis, and remained stable hemodynamics. Her K was slightly low at 3.3 and her Cr was 1.4. She was given IV Levoquin, along with nebs, and hospitalist was asked to admit her for CAP. She denied ill contact, distant travel, and had no flu like illness. She has improved with current tx, but her wheezing was persistent. Her K was still low at 3.0 mE/L and after continuing to supplement, it is now normal.  She was given oral Prednisone, but she is still having some wheezing.     Rewiew of Systems:  Constitutional: Negative for malaise, fever and chills. No significant weight loss or weight gain Eyes: Negative for eye pain, redness and discharge, diplopia, visual changes, or flashes of light. ENMT: Negative for ear pain, hoarseness, nasal congestion, sinus pressure and sore throat. No headaches; tinnitus, drooling, or problem swallowing. Cardiovascular: Negative for chest pain, palpitations, diaphoresis, and peripheral edema. ; No orthopnea, PND Respiratory: Negative for cough, hemoptysis, wheezing and stridor. No pleuritic chestpain. Gastrointestinal: Negative for nausea, vomiting, diarrhea, constipation, abdominal pain, melena, blood in stool, hematemesis, jaundice and rectal bleeding.    Genitourinary: Negative for frequency, dysuria, incontinence,flank pain and hematuria; Musculoskeletal: Negative for back pain and neck pain. Negative for swelling and trauma.;  Skin: . Negative for pruritus, rash, abrasions, bruising and skin  lesion.; ulcerations Neuro: Negative for headache, lightheadedness and neck stiffness. Negative for weakness, altered level of consciousness , altered mental status, extremity weakness, burning feet, involuntary movement, seizure and syncope.  Psych: negative for anxiety, depression, insomnia, tearfulness, panic attacks, hallucinations, paranoia, suicidal or homicidal ideation   Past Medical History  Diagnosis Date  . PONV (postoperative nausea and vomiting)   . Hypertension   . Depression   . Seasonal allergies   . Pneumonia   . Anxiety   . GERD (gastroesophageal reflux disease)   . H/O hiatal hernia   . Arthritis   . Anemia   . Obesity   . Sepsis ([place]) 10/02/2013    Past Surgical History  Procedure Laterality Date  . Back surgery      lumbar x2 by Dr Arnoldo Morale  . Tonsillectomy    . Appendectomy    . Abdominal hysterectomy    . Cholecystectomy    . Joint replacement Right 1995    hip  . Joint replacement Left 2008    hip  . Medial partial knee replacement Left     Dr Ronnie Derby  . Shoulder arthroscopy Right   . Shoulder arthroscopy Right     x2  . Cardiac catheterization  1990's    "okay"  . Total shoulder arthroplasty Right 02/16/2013    Procedure: TOTAL SHOULDER ARTHROPLASTY;  Surgeon: Marybelle Killings, MD;  Location: [place];  Service: Orthopedics;  Laterality: Right;  Right Total Shoulder Arthroplasty, Cemented  . Cervical fusion    . Cataract extraction w/phaco Left 08/06/2013    Procedure: CATARACT EXTRACTION PHACO AND INTRAOCULAR LENS PLACEMENT LEFT EYE;  Surgeon: Tonny Branch, MD;  Location: [facility];  Service:  Ophthalmology;  Laterality: Left;  CDE: 9.55  . Cataract extraction w/phaco Right 08/24/2013    Procedure: CATARACT EXTRACTION PHACO AND INTRAOCULAR LENS PLACEMENT (IOC);  Surgeon: Tonny Branch, MD;  Location: [facility];  Service: Ophthalmology;  Laterality: Right;  CDE:8.30  . Eye surgery    . Paraesophageal hernia repair  SEP 2010 DR. MCNATT  . Colonoscopy N/A 11/03/2013     SLF: 1. Normal mucosa in the terminal ileum. 2. 13 colon polyps removed. 3. Moderate diverticulosis in the descending colon and sigmoid colon 4. the left colon is redundant 5. Small internal  hemorrhoids.  . Esophagogastroduodenoscopy N/A 11/03/2013    SLF: 1. 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 Mild Non-erosive gastritis  . Hiatal hernia repair      [facility] in 2011  . Carpal tunnel release Right 03/2014    Dr. Lorin Mercy  . Gastric bypass  1979    revision in 1980    Medications:  HOME MEDS: Prior to Admission medications   Medication Sig Start Date End Date Taking? Authorizing Provider  acetaminophen (TYLENOL) 325 MG tablet Take 650 mg by mouth every 6 (six) hours as needed for mild pain.   Yes Historical Provider, MD  Calcium Citrate-Vitamin D (CITRACAL PETITES/VITAMIN D) 200-250 MG-UNIT TABS Take 1 tablet by mouth daily.   Yes Historical Provider, MD  dextromethorphan-guaiFENesin (MUCINEX DM) 30-600 MG 12hr tablet Take 1 tablet by mouth 2 (two) times daily as needed for cough.   Yes Historical Provider, MD  diazepam (VALIUM) 5 MG tablet Take 5 mg by mouth 3 (three) times daily as needed. For anxiety   Yes Historical Provider, MD  docusate sodium (COLACE) 100 MG capsule Take 100 mg by mouth at bedtime.    Yes Historical Provider, MD  fluocinonide cream (LIDEX) [id number] % Apply 1 application topically 2 (two) times daily. 04/14/15  Yes Florian Buff, MD  guaifenesin (ROBITUSSIN) 100 MG/5ML syrup Take 200 mg by mouth 3 (three) times daily as needed for cough.   Yes Historical Provider, MD  HYDROcodone-acetaminophen (NORCO) 10-325 MG per tablet Take 1 tablet by mouth every 6 (six) hours as needed for moderate pain.   Yes Historical Provider, MD  iron polysaccharides (NIFEREX) 150 MG capsule Take 1 capsule (150 mg total) by mouth 2 (two) times daily. 10/03/13  Yes Kathie Dike, MD  lisinopril-hydrochlorothiazide (PRINZIDE,ZESTORETIC) 20-12.5 MG  tablet 1 tablet daily.  06/16/15  Yes Historical Provider, MD  nystatin-triamcinolone ointment (MYCOLOG) Apply 1 application topically 2 (two) times daily. 03/31/15  Yes Florian Buff, MD  pantoprazole (PROTONIX) 40 MG tablet 40 mg daily.  06/28/15  Yes Historical Provider, MD  potassium chloride (K-DUR) 10 MEQ tablet Take 10 mEq by mouth 3 (three) times daily with meals.    Yes Historical Provider, MD  sertraline (ZOLOFT) 100 MG tablet Take 100 mg by mouth daily.   Yes Historical Provider, MD  traZODone (DESYREL) 50 MG tablet Take 50 mg by mouth at bedtime.   Yes Historical Provider, MD     Allergies:  Allergies  Allergen Reactions  . Novocain [Procaine Hcl]     Unknown-passed out with novocaine injected for dental surgery.  . Other Hives    EKG pads - need to use pediatric pads  . Latex Rash  . Penicillins Rash  . Sulfa Antibiotics Rash    Social History:   reports that she has never smoked. She has never used smokeless tobacco. She reports that  she does not drink alcohol or use illicit drugs.  Family History: Family History  Problem Relation Age of Onset  . Colon cancer Brother     IN HIS 60s-METASTATIC  . Hypertension Brother   . Colon polyps Paternal Grandfather   . Breast cancer Mother   . Hypertension Father      Physical Exam: Filed Vitals:   07/16/15 0131 07/16/15 0500 07/16/15 0855 07/16/15 0933  BP:  171/41  134/108  Pulse:  100  72  Temp:  97.9 F (36.6 C)    TempSrc:  Oral    Resp:    18  Height:      Weight:      SpO2: 93% 98% 97% 98%   Blood pressure 134/108, pulse 72, temperature 97.9 F (36.6 C), temperature source Oral, resp. rate 18, height 5\' 3"  (1.6 m), weight 104.781 kg (231 lb), SpO2 98 %.  GEN:  Pleasant  patient lying in the stretcher in no acute distress; cooperative with exam. PSYCH:  alert and oriented x4; does not appear anxious or depressed; affect is appropriate. HEENT: Mucous membranes pink and anicteric; PERRLA; EOM intact; no  cervical lymphadenopathy nor thyromegaly or carotid bruit; no JVD; There were no stridor. Neck is very supple. Breasts:: Not examined CHEST WALL: No tenderness CHEST: Normal respiration, still has bilateral wheezing, no rales.  More air exchange.  HEART: Regular rate and rhythm.  There are no murmur, rub, or gallops.   BACK: No kyphosis or scoliosis; no CVA tenderness ABDOMEN: soft and non-tender; no masses, no organomegaly, normal abdominal bowel sounds; no pannus; no intertriginous candida. There is no rebound and no distention. Rectal Exam: Not done EXTREMITIES: No bone or joint deformity; age-appropriate arthropathy of the hands and knees; no edema; no ulcerations.  There is no calf tenderness. Genitalia: not examined PULSES: 2+ and symmetric SKIN: Normal hydration no rash or ulceration CNS: Cranial nerves 2-12 grossly intact no focal lateralizing neurologic deficit.  Speech is fluent; uvula elevated with phonation, facial symmetry and tongue midline. DTR are normal bilaterally, cerebella exam is intact, barbinski is negative and strengths are equaled bilaterally.  No sensory loss.   Labs on Admission:  Basic Metabolic Panel:  Recent Labs Lab 07/14/15 0808 07/15/15 0514 07/16/15 0610  NA 139 136 138  K 3.3* 3.0* 4.4  CL 105 106 109  CO2 24 25 25   GLUCOSE 131* 125* 131*  BUN 21* 17 15  CREATININE 1.40* 1.23* 0.93  CALCIUM 8.3* 7.9* 8.3*   Liver Function Tests:  Recent Labs Lab 07/14/15 0808  AST 18  ALT 14  ALKPHOS 97  BILITOT 0.4  PROT 6.6  ALBUMIN 3.6   CBC:  Recent Labs Lab 07/14/15 0808 07/15/15 0514  WBC 7.4 6.7  NEUTROABS 5.9  --   HGB 13.0 12.1  HCT 40.4 37.6  MCV 95.5 96.2  PLT 124* 128*    Assessment/Plan Present on Admission:  . CAP (community acquired pneumonia) . Hypertension . Morbid obesity ([place])  PLAN: CAP: Will continue with antibiotic. She is improving.She is still having wheezing and SOB.   Hypokalemia: She was not able to  tolerate IV K runs.  She was given oral K supplement.   HTN: Stable.  Obesity: S/p gastric stapling surgery many years ago.    Other plans as per orders. Code Status:FULL CODE.    Orvan Falconer, MD.  FACP [facility] Pager [phone] 7pm to 7am.  07/16/2015, 2:16 PM

## 2015-07-17 MED ORDER — LISINOPRIL 10 MG PO TABS
20.0000 mg | ORAL_TABLET | Freq: Every day | ORAL | Status: DC
  Administered 2015-07-18 – 2015-07-21 (×4): 20 mg via ORAL
  Filled 2015-07-17 (×4): qty 2

## 2015-07-17 MED ORDER — METHYLPREDNISOLONE SODIUM SUCC 125 MG IJ SOLR
60.0000 mg | Freq: Four times a day (QID) | INTRAMUSCULAR | Status: DC
Start: 2015-07-17 — End: 2015-07-20
  Administered 2015-07-17 – 2015-07-20 (×14): 60 mg via INTRAVENOUS
  Filled 2015-07-17 (×14): qty 2

## 2015-07-17 MED ORDER — GUAIFENESIN-DM 100-10 MG/5ML PO SYRP
5.0000 mL | ORAL_SOLUTION | ORAL | Status: DC | PRN
  Administered 2015-07-17: 5 mL via ORAL
  Filled 2015-07-17: qty 5

## 2015-07-17 NOTE — Progress Notes (Signed)
[facility] PROGRESS NOTE  [name] [id number] DOB: Aug 26, 1940    PCP:   [name], MD   HPI:  [name] [name] is an 75 y.o. female with hx of gastric stapling procedure, morbid obesity, HTN, recurrent PNAs, Anxiety, allergy, presented to the ER with fever, productive coughs, and wheezing. She had seen her PCP and had a shot (? Steroid), but continued to have problems. Evaluation in the ER showed CXR with Left basilar PNA and no CHF. She has no leukocytosis, and remained stable hemodynamics. Her K was slightly low at 3.3 and her Cr was 1.4. She was given IV Levoquin, along with nebs, and hospitalist was asked to admit her for CAP. She denied ill contact, distant travel, and had no flu like illness. She has improved with current tx, but her wheezing was persistent. Her K was still low at 3.0 mE/L and after continuing to supplement, it is now normal. She was given oral Prednisone, but she is still having some wheezing.   Rewiew of Systems:  Constitutional: Negative for malaise, fever and chills. No significant weight loss or weight gain Eyes: Negative for eye pain, redness and discharge, diplopia, visual changes, or flashes of light. ENMT: Negative for ear pain, hoarseness, nasal congestion, sinus pressure and sore throat. No headaches; tinnitus, drooling, or problem swallowing. Cardiovascular: Negative for chest pain, palpitations, diaphoresis, dyspnea and peripheral edema. ; No orthopnea, PND Respiratory: Negative for cough, hemoptysis, wheezing and stridor. No pleuritic chestpain. Gastrointestinal: Negative for nausea, vomiting, diarrhea, constipation, abdominal pain, melena, blood in stool, hematemesis, jaundice and rectal bleeding.    Genitourinary: Negative for frequency, dysuria, incontinence,flank pain and hematuria; Musculoskeletal: Negative for back pain and neck pain. Negative for swelling and trauma.;  Skin: . Negative for pruritus, rash, abrasions, bruising and  skin lesion.; ulcerations Neuro: Negative for headache, lightheadedness and neck stiffness. Negative for weakness, altered level of consciousness , altered mental status, extremity weakness, burning feet, involuntary movement, seizure and syncope.  Psych: negative for anxiety, depression, insomnia, tearfulness, panic attacks, hallucinations, paranoia, suicidal or homicidal ideation    Past Medical History  Diagnosis Date  . PONV (postoperative nausea and vomiting)   . Hypertension   . Depression   . Seasonal allergies   . Pneumonia   . Anxiety   . GERD (gastroesophageal reflux disease)   . H/O hiatal hernia   . Arthritis   . Anemia   . Obesity   . Sepsis ([place]) 10/02/2013    Past Surgical History  Procedure Laterality Date  . Back surgery      lumbar x2 by Dr Arnoldo Morale  . Tonsillectomy    . Appendectomy    . Abdominal hysterectomy    . Cholecystectomy    . Joint replacement Right 1995    hip  . Joint replacement Left 2008    hip  . Medial partial knee replacement Left     Dr Ronnie Derby  . Shoulder arthroscopy Right   . Shoulder arthroscopy Right     x2  . Cardiac catheterization  1990's    "okay"  . Total shoulder arthroplasty Right 02/16/2013    Procedure: TOTAL SHOULDER ARTHROPLASTY;  Surgeon: Marybelle Killings, MD;  Location: [place];  Service: Orthopedics;  Laterality: Right;  Right Total Shoulder Arthroplasty, Cemented  . Cervical fusion    . Cataract extraction w/phaco Left 08/06/2013    Procedure: CATARACT EXTRACTION PHACO AND INTRAOCULAR LENS PLACEMENT LEFT EYE;  Surgeon: Tonny Branch, MD;  Location: [facility];  Service:  Ophthalmology;  Laterality: Left;  CDE: 9.55  . Cataract extraction w/phaco Right 08/24/2013    Procedure: CATARACT EXTRACTION PHACO AND INTRAOCULAR LENS PLACEMENT (IOC);  Surgeon: Tonny Branch, MD;  Location: [facility];  Service: Ophthalmology;  Laterality: Right;  CDE:8.30  . Eye surgery    . Paraesophageal hernia repair  SEP 2010 DR. MCNATT  . Colonoscopy N/A 11/03/2013     SLF: 1. Normal mucosa in the terminal ileum. 2. 13 colon polyps removed. 3. Moderate diverticulosis in the descending colon and sigmoid colon 4. the left colon is redundant 5. Small internal  hemorrhoids.  . Esophagogastroduodenoscopy N/A 11/03/2013    SLF: 1. 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 Mild Non-erosive gastritis  . Hiatal hernia repair      [facility] in 2011  . Carpal tunnel release Right 03/2014    Dr. Lorin Mercy  . Gastric bypass  1979    revision in 1980    Medications:  HOME MEDS: Prior to Admission medications   Medication Sig Start Date End Date Taking? Authorizing Provider  acetaminophen (TYLENOL) 325 MG tablet Take 650 mg by mouth every 6 (six) hours as needed for mild pain.   Yes Historical Provider, MD  Calcium Citrate-Vitamin D (CITRACAL PETITES/VITAMIN D) 200-250 MG-UNIT TABS Take 1 tablet by mouth daily.   Yes Historical Provider, MD  dextromethorphan-guaiFENesin (MUCINEX DM) 30-600 MG 12hr tablet Take 1 tablet by mouth 2 (two) times daily as needed for cough.   Yes Historical Provider, MD  diazepam (VALIUM) 5 MG tablet Take 5 mg by mouth 3 (three) times daily as needed. For anxiety   Yes Historical Provider, MD  docusate sodium (COLACE) 100 MG capsule Take 100 mg by mouth at bedtime.    Yes Historical Provider, MD  fluocinonide cream (LIDEX) [id number] % Apply 1 application topically 2 (two) times daily. 04/14/15  Yes Florian Buff, MD  guaifenesin (ROBITUSSIN) 100 MG/5ML syrup Take 200 mg by mouth 3 (three) times daily as needed for cough.   Yes Historical Provider, MD  HYDROcodone-acetaminophen (NORCO) 10-325 MG per tablet Take 1 tablet by mouth every 6 (six) hours as needed for moderate pain.   Yes Historical Provider, MD  iron polysaccharides (NIFEREX) 150 MG capsule Take 1 capsule (150 mg total) by mouth 2 (two) times daily. 10/03/13  Yes Kathie Dike, MD  lisinopril-hydrochlorothiazide (PRINZIDE,ZESTORETIC) 20-12.5  MG tablet 1 tablet daily.  06/16/15  Yes Historical Provider, MD  nystatin-triamcinolone ointment (MYCOLOG) Apply 1 application topically 2 (two) times daily. 03/31/15  Yes Florian Buff, MD  pantoprazole (PROTONIX) 40 MG tablet 40 mg daily.  06/28/15  Yes Historical Provider, MD  potassium chloride (K-DUR) 10 MEQ tablet Take 10 mEq by mouth 3 (three) times daily with meals.    Yes Historical Provider, MD  sertraline (ZOLOFT) 100 MG tablet Take 100 mg by mouth daily.   Yes Historical Provider, MD  traZODone (DESYREL) 50 MG tablet Take 50 mg by mouth at bedtime.   Yes Historical Provider, MD     Allergies:  Allergies  Allergen Reactions  . Novocain [Procaine Hcl]     Unknown-passed out with novocaine injected for dental surgery.  . Other Hives    EKG pads - need to use pediatric pads  . Latex Rash  . Penicillins Rash  . Sulfa Antibiotics Rash    Social History:   reports that she has never smoked. She has never used smokeless tobacco. She reports that  she does not drink alcohol or use illicit drugs.  Family History: Family History  Problem Relation Age of Onset  . Colon cancer Brother     IN HIS 60s-METASTATIC  . Hypertension Brother   . Colon polyps Paternal Grandfather   . Breast cancer Mother   . Hypertension Father      Physical Exam: Filed Vitals:   07/17/15 0122 07/17/15 0625 07/17/15 0803 07/17/15 0916  BP:  72/52  168/60  Pulse:  91  77  Temp:  98.4 F (36.9 C)    TempSrc:  Oral    Resp:  18  18  Height:      Weight:      SpO2: 94% 96% 95% 94%   Blood pressure 168/60, pulse 77, temperature 98.4 F (36.9 C), temperature source Oral, resp. rate 18, height 5\' 3"  (1.6 m), weight 104.781 kg (231 lb), SpO2 94 %.  GEN:  Pleasant  patient lying in the stretcher in no acute distress; cooperative with exam. PSYCH:  alert and oriented x4; does not appear anxious or depressed; affect is appropriate. HEENT: Mucous membranes pink and anicteric; PERRLA; EOM intact; no  cervical lymphadenopathy nor thyromegaly or carotid bruit; no JVD; There were no stridor. Neck is very supple. Breasts:: Not examined CHEST WALL: No tenderness CHEST: Normal respiration, She is still having significant wheezing. HEART: Regular rate and rhythm.  There are no murmur, rub, or gallops.   BACK: No kyphosis or scoliosis; no CVA tenderness ABDOMEN: soft and non-tender; no masses, no organomegaly, normal abdominal bowel sounds; no pannus; no intertriginous candida. There is no rebound and no distention. Rectal Exam: Not done EXTREMITIES: No bone or joint deformity; age-appropriate arthropathy of the hands and knees; no edema; no ulcerations.  There is no calf tenderness. Genitalia: not examined PULSES: 2+ and symmetric SKIN: Normal hydration no rash or ulceration CNS: Cranial nerves 2-12 grossly intact no focal lateralizing neurologic deficit.  Speech is fluent; uvula elevated with phonation, facial symmetry and tongue midline. DTR are normal bilaterally, cerebella exam is intact, barbinski is negative and strengths are equaled bilaterally.  No sensory loss.   Labs on Admission:  Basic Metabolic Panel:  Recent Labs Lab 07/14/15 0808 07/15/15 0514 07/16/15 0610  NA 139 136 138  K 3.3* 3.0* 4.4  CL 105 106 109  CO2 24 25 25   GLUCOSE 131* 125* 131*  BUN 21* 17 15  CREATININE 1.40* 1.23* 0.93  CALCIUM 8.3* 7.9* 8.3*   Liver Function Tests:  Recent Labs Lab 07/14/15 0808  AST 18  ALT 14  ALKPHOS 97  BILITOT 0.4  PROT 6.6  ALBUMIN 3.6    Recent Labs Lab 07/14/15 0808 07/15/15 0514  WBC 7.4 6.7  NEUTROABS 5.9  --   HGB 13.0 12.1  HCT 40.4 37.6  MCV 95.5 96.2  PLT 124* 128*    Assessment/Plan Present on Admission:  . CAP (community acquired pneumonia) . Hypertension . Morbid obesity ([place])  PLAN:  CAP:  Will continue with IV Levaquin, nebs, and oxygen.  For her wheezing, I am going to give her IV Steroids.    HTN:  BP is a little elevated. Will  increase Lisinopril to 20 mg per day.   Obesity:  S/p gastric stapling surgery many years ago.   Other plans as per orders. Code Status: FULL Haskel Khan, MD.  FACP [facility] Pager [phone] 7pm to 7am.  07/17/2015, 11:37 AM

## 2015-07-18 MED ORDER — LEVOFLOXACIN 750 MG PO TABS
750.0000 mg | ORAL_TABLET | Freq: Every day | ORAL | Status: DC
  Administered 2015-07-19: 750 mg via ORAL
  Filled 2015-07-18: qty 1

## 2015-07-18 MED ORDER — LEVOFLOXACIN IN D5W 750 MG/150ML IV SOLN: 750.0000 mg | INTRAVENOUS | Status: DC

## 2015-07-18 NOTE — Progress Notes (Signed)
[facility] PROGRESS NOTE  [name] [name] [id number] DOB: 1940/12/02    PCP:   [name], MD   HPI:  [name] [name] is an 75 y.o. female with hx of gastric stapling procedure, morbid obesity, HTN, recurrent PNAs, Anxiety, allergy, presented to the ER with fever, productive coughs, and wheezing. She had seen her PCP and had a shot (? Steroid), but continued to have problems. Evaluation in the ER showed CXR with Left basilar PNA and no CHF. She has no leukocytosis, and remained stable hemodynamics. Her K was slightly low at 3.3 and her Cr was 1.4. She was given IV Levoquin, along with nebs, and hospitalist was asked to admit her for CAP. She denied ill contact, distant travel, and had no flu like illness. She has improved with current tx, but her wheezing was persistent. Oral Prednisone was added, but with persistent wheezing, she was given IV Steroid.  This has helped her, and she has been able to ambulate.  For her low K at  3.0 mE/L and after continuing to supplement, it is now normal.   Rewiew of Systems:  Constitutional: Negative for malaise, fever and chills. No significant weight loss or weight gain Eyes: Negative for eye pain, redness and discharge, diplopia, visual changes, or flashes of light. ENMT: Negative for ear pain, hoarseness, nasal congestion, sinus pressure and sore throat. No headaches; tinnitus, drooling, or problem swallowing. Cardiovascular: Negative for chest pain, palpitations, diaphoresis, dyspnea and peripheral edema. ; No orthopnea, PND Respiratory: Negative for hemoptysis, and stridor. No pleuritic chestpain. Gastrointestinal: Negative for nausea, vomiting, diarrhea, constipation, abdominal pain, melena, blood in stool, hematemesis, jaundice and rectal bleeding.    Genitourinary: Negative for frequency, dysuria, incontinence,flank pain and hematuria; Musculoskeletal: Negative for back pain and neck pain. Negative for swelling and trauma.;  Skin: .  Negative for pruritus, rash, abrasions, bruising and skin lesion.; ulcerations Neuro: Negative for headache, lightheadedness and neck stiffness. Negative for weakness, altered level of consciousness , altered mental status, extremity weakness, burning feet, involuntary movement, seizure and syncope.  Psych: negative for anxiety, depression, insomnia, tearfulness, panic attacks, hallucinations, paranoia, suicidal or homicidal ideation    Past Medical History  Diagnosis Date  . PONV (postoperative nausea and vomiting)   . Hypertension   . Depression   . Seasonal allergies   . Pneumonia   . Anxiety   . GERD (gastroesophageal reflux disease)   . H/O hiatal hernia   . Arthritis   . Anemia   . Obesity   . Sepsis ([place]) 10/02/2013    Past Surgical History  Procedure Laterality Date  . Back surgery      lumbar x2 by Dr Arnoldo Morale  . Tonsillectomy    . Appendectomy    . Abdominal hysterectomy    . Cholecystectomy    . Joint replacement Right 1995    hip  . Joint replacement Left 2008    hip  . Medial partial knee replacement Left     Dr Ronnie Derby  . Shoulder arthroscopy Right   . Shoulder arthroscopy Right     x2  . Cardiac catheterization  1990's    "okay"  . Total shoulder arthroplasty Right 02/16/2013    Procedure: TOTAL SHOULDER ARTHROPLASTY;  Surgeon: Marybelle Killings, MD;  Location: [place];  Service: Orthopedics;  Laterality: Right;  Right Total Shoulder Arthroplasty, Cemented  . Cervical fusion    . Cataract extraction w/phaco Left 08/06/2013    Procedure: CATARACT EXTRACTION PHACO AND INTRAOCULAR LENS PLACEMENT LEFT  EYE;  Surgeon: Tonny Branch, MD;  Location: [facility];  Service: Ophthalmology;  Laterality: Left;  CDE: 9.55  . Cataract extraction w/phaco Right 08/24/2013    Procedure: CATARACT EXTRACTION PHACO AND INTRAOCULAR LENS PLACEMENT (IOC);  Surgeon: Tonny Branch, MD;  Location: [facility];  Service: Ophthalmology;  Laterality: Right;  CDE:8.30  . Eye surgery    . Paraesophageal hernia  repair  SEP 2010 DR. MCNATT  . Colonoscopy N/A 11/03/2013    SLF: 1. Normal mucosa in the terminal ileum. 2. 13 colon polyps removed. 3. Moderate diverticulosis in the descending colon and sigmoid colon 4. the left colon is redundant 5. Small internal  hemorrhoids.  . Esophagogastroduodenoscopy N/A 11/03/2013    SLF: 1. 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 Mild Non-erosive gastritis  . Hiatal hernia repair      [facility] in 2011  . Carpal tunnel release Right 03/2014    Dr. Lorin Mercy  . Gastric bypass  1979    revision in 1980    Medications:  HOME MEDS: Prior to Admission medications   Medication Sig Start Date End Date Taking? Authorizing Provider  acetaminophen (TYLENOL) 325 MG tablet Take 650 mg by mouth every 6 (six) hours as needed for mild pain.   Yes Historical Provider, MD  Calcium Citrate-Vitamin D (CITRACAL PETITES/VITAMIN D) 200-250 MG-UNIT TABS Take 1 tablet by mouth daily.   Yes Historical Provider, MD  dextromethorphan-guaiFENesin (MUCINEX DM) 30-600 MG 12hr tablet Take 1 tablet by mouth 2 (two) times daily as needed for cough.   Yes Historical Provider, MD  diazepam (VALIUM) 5 MG tablet Take 5 mg by mouth 3 (three) times daily as needed. For anxiety   Yes Historical Provider, MD  docusate sodium (COLACE) 100 MG capsule Take 100 mg by mouth at bedtime.    Yes Historical Provider, MD  fluocinonide cream (LIDEX) [id number] % Apply 1 application topically 2 (two) times daily. 04/14/15  Yes Florian Buff, MD  guaifenesin (ROBITUSSIN) 100 MG/5ML syrup Take 200 mg by mouth 3 (three) times daily as needed for cough.   Yes Historical Provider, MD  HYDROcodone-acetaminophen (NORCO) 10-325 MG per tablet Take 1 tablet by mouth every 6 (six) hours as needed for moderate pain.   Yes Historical Provider, MD  iron polysaccharides (NIFEREX) 150 MG capsule Take 1 capsule (150 mg total) by mouth 2 (two) times daily. 10/03/13  Yes Kathie Dike, MD   lisinopril-hydrochlorothiazide (PRINZIDE,ZESTORETIC) 20-12.5 MG tablet 1 tablet daily.  06/16/15  Yes Historical Provider, MD  nystatin-triamcinolone ointment (MYCOLOG) Apply 1 application topically 2 (two) times daily. 03/31/15  Yes Florian Buff, MD  pantoprazole (PROTONIX) 40 MG tablet 40 mg daily.  06/28/15  Yes Historical Provider, MD  potassium chloride (K-DUR) 10 MEQ tablet Take 10 mEq by mouth 3 (three) times daily with meals.    Yes Historical Provider, MD  sertraline (ZOLOFT) 100 MG tablet Take 100 mg by mouth daily.   Yes Historical Provider, MD  traZODone (DESYREL) 50 MG tablet Take 50 mg by mouth at bedtime.   Yes Historical Provider, MD     Allergies:  Allergies  Allergen Reactions  . Novocain [Procaine Hcl]     Unknown-passed out with novocaine injected for dental surgery.  . Other Hives    EKG pads - need to use pediatric pads  . Latex Rash  . Penicillins Rash  . Sulfa Antibiotics Rash    Social History:   reports that she  has never smoked. She has never used smokeless tobacco. She reports that she does not drink alcohol or use illicit drugs.  Family History: Family History  Problem Relation Age of Onset  . Colon cancer Brother     IN HIS 60s-METASTATIC  . Hypertension Brother   . Colon polyps Paternal Grandfather   . Breast cancer Mother   . Hypertension Father      Physical Exam: Filed Vitals:   07/17/15 1945 07/17/15 2213 07/18/15 0400 07/18/15 1328  BP:  140/54 179/67 136/64  Pulse:  54 45 57  Temp:  97.9 F (36.6 C) 97.8 F (36.6 C) 98.9 F (37.2 C)  TempSrc:  Oral Oral Oral  Resp:  16  20  Height:      Weight:      SpO2: 98% 96% 97% 94%   Blood pressure 136/64, pulse 57, temperature 98.9 F (37.2 C), temperature source Oral, resp. rate 20, height 5\' 3"  (1.6 m), weight 104.781 kg (231 lb), SpO2 94 %.  GEN:  Pleasant  patient lying in the stretcher in no acute distress; cooperative with exam. PSYCH:  alert and oriented x4; does not appear  anxious or depressed; affect is appropriate. HEENT: Mucous membranes pink and anicteric; PERRLA; EOM intact; no cervical lymphadenopathy nor thyromegaly or carotid bruit; no JVD; There were no stridor. Neck is very supple. Breasts:: Not examined CHEST WALL: No tenderness CHEST: Normal respiration, wheezing has significantly improved.  HEART: Regular rate and rhythm.  There are no murmur, rub, or gallops.   BACK: No kyphosis or scoliosis; no CVA tenderness ABDOMEN: soft and non-tender; no masses, no organomegaly, normal abdominal bowel sounds; no pannus; no intertriginous candida. There is no rebound and no distention. Rectal Exam: Not done EXTREMITIES: No bone or joint deformity; age-appropriate arthropathy of the hands and knees; no edema; no ulcerations.  There is no calf tenderness. Genitalia: not examined PULSES: 2+ and symmetric SKIN: Normal hydration no rash or ulceration CNS: Cranial nerves 2-12 grossly intact no focal lateralizing neurologic deficit.  Speech is fluent; uvula elevated with phonation, facial symmetry and tongue midline. DTR are normal bilaterally, cerebella exam is intact, barbinski is negative and strengths are equaled bilaterally.  No sensory loss.   Labs on Admission:  Basic Metabolic Panel:  Recent Labs Lab 07/14/15 0808 07/15/15 0514 07/16/15 0610  NA 139 136 138  K 3.3* 3.0* 4.4  CL 105 106 109  CO2 24 25 25   GLUCOSE 131* 125* 131*  BUN 21* 17 15  CREATININE 1.40* 1.23* 0.93  CALCIUM 8.3* 7.9* 8.3*   Liver Function Tests:  Recent Labs Lab 07/14/15 0808  AST 18  ALT 14  ALKPHOS 97  BILITOT 0.4  PROT 6.6  ALBUMIN 3.6   CBC:  Recent Labs Lab 07/14/15 0808 07/15/15 0514  WBC 7.4 6.7  NEUTROABS 5.9  --   HGB 13.0 12.1  HCT 40.4 37.6  MCV 95.5 96.2  PLT 124* 128*    Assessment/Plan Present on Admission:  . CAP (community acquired pneumonia) . Hypertension . Morbid obesity ([place])  PLAN:  CAP:  Will continue with antibiotic,  steroids, and nebs.  She is improving and will likely be able to go home tomorrow.   HTN:  BP is better today.  WIll continue with meds.   Other plans as per orders. Code Status: FULL Haskel Khan, MD.  FACP [facility] Pager [phone] 7pm to 7am.  07/18/2015, 1:38 PM

## 2015-07-18 NOTE — Care Management Important Message (Signed)
Important Message  Patient Details  Name: [name] MRN: [id number] Date of Birth: 1941/01/09   Medicare Important Message Given:  Yes    Alvie Heidelberg, RN 07/18/2015, 9:14 AM

## 2015-07-19 ENCOUNTER — Inpatient Hospital Stay (HOSPITAL_COMMUNITY): Payer: Medicare Other

## 2015-07-19 ENCOUNTER — Other Ambulatory Visit: Payer: Self-pay | Admitting: *Deleted

## 2015-07-19 DIAGNOSIS — J441 Chronic obstructive pulmonary disease with (acute) exacerbation: Secondary | ICD-10-CM

## 2015-07-19 LAB — CULTURE, BLOOD (ROUTINE X 2)
Culture: NO GROWTH
Culture: NO GROWTH

## 2015-07-19 MED ORDER — DEXTROSE 5 % IV SOLN
1.0000 g | INTRAVENOUS | Status: DC
  Administered 2015-07-19 – 2015-07-20 (×2): 1 g via INTRAVENOUS
  Filled 2015-07-19 (×2): qty 10

## 2015-07-19 MED ORDER — PANTOPRAZOLE SODIUM 40 MG PO TBEC
40.0000 mg | DELAYED_RELEASE_TABLET | Freq: Two times a day (BID) | ORAL | Status: DC
  Administered 2015-07-19 – 2015-07-21 (×4): 40 mg via ORAL
  Filled 2015-07-19 (×4): qty 1

## 2015-07-19 MED ORDER — DEXTROSE 5 % IV SOLN
500.0000 mg | INTRAVENOUS | Status: DC
  Administered 2015-07-19: 500 mg via INTRAVENOUS
  Filled 2015-07-19 (×2): qty 500

## 2015-07-19 MED ORDER — IOPAMIDOL (ISOVUE-300) INJECTION 61%
75.0000 mL | Freq: Once | INTRAVENOUS | Status: AC | PRN
  Administered 2015-07-19: 75 mL via INTRAVENOUS

## 2015-07-19 NOTE — Clinical Documentation Improvement (Signed)
Hospitalists  Please document query responses in the progress notes and discharge summary. Please do not document query responses on the [facility] BPA in [facility]. Please do not deactivate queries without addressing them.   Thank you.  Please document if a condition below provides greater specificity regarding the patient's "volume depletion, mild.  Will give IV fluids" documented in the H&P and subsequent progress notes.   - Acute Kidney Injury, resolved  - Other condition  - Unable to clinically determine  Clinical Information: BUN/Creat/GFR trend this admission     (white female) Creatinine improved 0.47 md/dL in 48 hours Component     Latest Ref Rng 07/14/2015 07/15/2015 07/16/2015  BUN     6 - 20 mg/dL 21 (H) 17 15  Creatinine     0.44 - 1.00 mg/dL 1.40 (H) 1.23 (H) 0.93  EGFR (Non-African Amer.)     >60 mL/min 36 (L) 42 (L) 59 (L)   Please exercise your independent, professional judgment when responding. A specific answer is not anticipated or expected.   Thank You, Erling Conte  RN BSN [facility]S [phone] Health Information Management [place]

## 2015-07-19 NOTE — Consult Note (Signed)
   [facility] CM Inpatient Consult   07/19/2015  [name] [name] 1940-10-17 [id number]  Patient evaluated for community based chronic disease management services with [facility] as a benefit of patient's Medicare. Spoke with patient at bedside to explain [facility] services.  Patient consents to participate and written consent obtained at bedside. Patient will receive post hospital discharge call and will be evaluated for monthly home visits for assessments and disease process education. Patient states she lives alone with some support from a niece, Dr. [name] is her primary care provider.    Left contact information and [facility] literature at bedside.   Made Inpatient Case Manager aware that [place] Management following.  Of note, [facility] Care Management services does not replace or interfere with any services that are arranged by inpatient case management or social work.   For additional questions or referrals please contact:    Royetta Crochet. Laymond Purser, RN, BSN, [place] [phone]) Business Cell  [phone]) Toll Free Office

## 2015-07-19 NOTE — Progress Notes (Addendum)
[facility] PROGRESS NOTE  [name] [name] [id number] DOB: 27-Jun-1940    PCP:   [name], MD   HPI: [name] [name] is an 75 y.o. female with hx of gastric stapling procedure, morbid obesity, HTN, recurrent PNAs, Anxiety, allergy, presented to the ER with fever, productive coughs, and wheezing. She had seen her PCP and had a shot (? Steroid), but continued to have problems. Evaluation in the ER showed CXR with Left basilar PNA and no CHF. She has no leukocytosis, and remained stable hemodynamics. Her K was slightly low at 3.3 and her Cr was 1.4. She was given IV Levoquin, along with nebs, and hospitalist was asked to admit her for CAP. She denied ill contact, distant travel, and had no flu like illness. She has improved with current tx, but her wheezing was persistent. Oral Prednisone was added, but with persistent wheezing, she was given IV Steroid. This has helped her, and she has been able to ambulate. For her low K at 3.0 mE/L and after continuing to supplement, it is now normal. She is still having significant wheezing.  She does have some reflux, but not any worse than previously.  Her AKI has now resolved with IVF and her Cr has improved.    Rewiew of Systems:  Constitutional: Negative for malaise, fever and chills. No significant weight loss or weight gain Eyes: Negative for eye pain, redness and discharge, diplopia, visual changes, or flashes of light. ENMT: Negative for ear pain, hoarseness, nasal congestion, sinus pressure and sore throat. No headaches; tinnitus, drooling, or problem swallowing. Cardiovascular: Negative for chest pain, palpitations, diaphoresis, dyspnea and peripheral edema. ; No orthopnea, PND Respiratory: Negative for cough, hemoptysis, wheezing and stridor. No pleuritic chestpain. Gastrointestinal: Negative for nausea, vomiting, diarrhea, constipation, abdominal pain, melena, blood in stool, hematemesis, jaundice and rectal bleeding.     Genitourinary: Negative for frequency, dysuria, incontinence,flank pain and hematuria; Musculoskeletal: Negative for back pain and neck pain. Negative for swelling and trauma.;  Skin: . Negative for pruritus, rash, abrasions, bruising and skin lesion.; ulcerations Neuro: Negative for headache, lightheadedness and neck stiffness. Negative for weakness, altered level of consciousness , altered mental status, extremity weakness, burning feet, involuntary movement, seizure and syncope.  Psych: negative for anxiety, depression, insomnia, tearfulness, panic attacks, hallucinations, paranoia, suicidal or homicidal ideation    Past Medical History  Diagnosis Date  . PONV (postoperative nausea and vomiting)   . Hypertension   . Depression   . Seasonal allergies   . Pneumonia   . Anxiety   . GERD (gastroesophageal reflux disease)   . H/O hiatal hernia   . Arthritis   . Anemia   . Obesity   . Sepsis ([place]) 10/02/2013    Past Surgical History  Procedure Laterality Date  . Back surgery      lumbar x2 by Dr Arnoldo Morale  . Tonsillectomy    . Appendectomy    . Abdominal hysterectomy    . Cholecystectomy    . Joint replacement Right 1995    hip  . Joint replacement Left 2008    hip  . Medial partial knee replacement Left     Dr Ronnie Derby  . Shoulder arthroscopy Right   . Shoulder arthroscopy Right     x2  . Cardiac catheterization  1990's    "okay"  . Total shoulder arthroplasty Right 02/16/2013    Procedure: TOTAL SHOULDER ARTHROPLASTY;  Surgeon: Marybelle Killings, MD;  Location: [place];  Service: Orthopedics;  Laterality: Right;  Right Total Shoulder Arthroplasty, Cemented  . Cervical fusion    . Cataract extraction w/phaco Left 08/06/2013    Procedure: CATARACT EXTRACTION PHACO AND INTRAOCULAR LENS PLACEMENT LEFT EYE;  Surgeon: Tonny Branch, MD;  Location: [facility];  Service: Ophthalmology;  Laterality: Left;  CDE: 9.55  . Cataract extraction w/phaco Right 08/24/2013    Procedure: CATARACT EXTRACTION  PHACO AND INTRAOCULAR LENS PLACEMENT (IOC);  Surgeon: Tonny Branch, MD;  Location: [facility];  Service: Ophthalmology;  Laterality: Right;  CDE:8.30  . Eye surgery    . Paraesophageal hernia repair  SEP 2010 DR. MCNATT  . Colonoscopy N/A 11/03/2013    SLF: 1. Normal mucosa in the terminal ileum. 2. 13 colon polyps removed. 3. Moderate diverticulosis in the descending colon and sigmoid colon 4. the left colon is redundant 5. Small internal  hemorrhoids.  . Esophagogastroduodenoscopy N/A 11/03/2013    SLF: 1. 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 Mild Non-erosive gastritis  . Hiatal hernia repair      [facility] in 2011  . Carpal tunnel release Right 03/2014    Dr. Lorin Mercy  . Gastric bypass  1979    revision in 1980    Medications:  HOME MEDS: Prior to Admission medications   Medication Sig Start Date End Date Taking? Authorizing Provider  acetaminophen (TYLENOL) 325 MG tablet Take 650 mg by mouth every 6 (six) hours as needed for mild pain.   Yes Historical Provider, MD  Calcium Citrate-Vitamin D (CITRACAL PETITES/VITAMIN D) 200-250 MG-UNIT TABS Take 1 tablet by mouth daily.   Yes Historical Provider, MD  dextromethorphan-guaiFENesin (MUCINEX DM) 30-600 MG 12hr tablet Take 1 tablet by mouth 2 (two) times daily as needed for cough.   Yes Historical Provider, MD  diazepam (VALIUM) 5 MG tablet Take 5 mg by mouth 3 (three) times daily as needed. For anxiety   Yes Historical Provider, MD  docusate sodium (COLACE) 100 MG capsule Take 100 mg by mouth at bedtime.    Yes Historical Provider, MD  fluocinonide cream (LIDEX) [id number] % Apply 1 application topically 2 (two) times daily. 04/14/15  Yes Florian Buff, MD  guaifenesin (ROBITUSSIN) 100 MG/5ML syrup Take 200 mg by mouth 3 (three) times daily as needed for cough.   Yes Historical Provider, MD  HYDROcodone-acetaminophen (NORCO) 10-325 MG per tablet Take 1 tablet by mouth every 6 (six) hours as needed for  moderate pain.   Yes Historical Provider, MD  iron polysaccharides (NIFEREX) 150 MG capsule Take 1 capsule (150 mg total) by mouth 2 (two) times daily. 10/03/13  Yes Kathie Dike, MD  lisinopril-hydrochlorothiazide (PRINZIDE,ZESTORETIC) 20-12.5 MG tablet 1 tablet daily.  06/16/15  Yes Historical Provider, MD  nystatin-triamcinolone ointment (MYCOLOG) Apply 1 application topically 2 (two) times daily. 03/31/15  Yes Florian Buff, MD  pantoprazole (PROTONIX) 40 MG tablet 40 mg daily.  06/28/15  Yes Historical Provider, MD  potassium chloride (K-DUR) 10 MEQ tablet Take 10 mEq by mouth 3 (three) times daily with meals.    Yes Historical Provider, MD  sertraline (ZOLOFT) 100 MG tablet Take 100 mg by mouth daily.   Yes Historical Provider, MD  traZODone (DESYREL) 50 MG tablet Take 50 mg by mouth at bedtime.   Yes Historical Provider, MD     Allergies:  Allergies  Allergen Reactions  . Novocain [Procaine Hcl]     Unknown-passed out with novocaine injected for dental surgery.  . Other Hives    EKG  pads - need to use pediatric pads  . Latex Rash  . Penicillins Rash  . Sulfa Antibiotics Rash    Social History:   reports that she has never smoked. She has never used smokeless tobacco. She reports that she does not drink alcohol or use illicit drugs.  Family History: Family History  Problem Relation Age of Onset  . Colon cancer Brother     IN HIS 60s-METASTATIC  . Hypertension Brother   . Colon polyps Paternal Grandfather   . Breast cancer Mother   . Hypertension Father      Physical Exam: Filed Vitals:   07/18/15 2018 07/18/15 2133 07/19/15 0611 07/19/15 0836  BP:  134/51 155/71   Pulse:  66 55   Temp:  98.1 F (36.7 C) 98.1 F (36.7 C)   TempSrc:  Oral Oral   Resp:  20 18   Height:      Weight:      SpO2: 95% 96% 97% 97%   Blood pressure 155/71, pulse 55, temperature 98.1 F (36.7 C), temperature source Oral, resp. rate 18, height 5\' 3"  (1.6 m), weight 104.781 kg (231 lb),  SpO2 97 %.  GEN:  Pleasant  patient lying in the stretcher in no acute distress; cooperative with exam. PSYCH:  alert and oriented x4; does not appear anxious or depressed; affect is appropriate. HEENT: Mucous membranes pink and anicteric; PERRLA; EOM intact; no cervical lymphadenopathy nor thyromegaly or carotid bruit; no JVD; There were no stridor. Neck is very supple. Breasts:: Not examined CHEST WALL: No tenderness CHEST: Normal respiration, significant wheezing bilaterally.  HEART: Regular rate and rhythm.  There are no murmur, rub, or gallops.   BACK: No kyphosis or scoliosis; no CVA tenderness ABDOMEN: soft and non-tender; no masses, no organomegaly, normal abdominal bowel sounds; no pannus; no intertriginous candida. There is no rebound and no distention. Rectal Exam: Not done EXTREMITIES: No bone or joint deformity; age-appropriate arthropathy of the hands and knees; no edema; no ulcerations.  There is no calf tenderness. Genitalia: not examined PULSES: 2+ and symmetric SKIN: Normal hydration no rash or ulceration CNS: Cranial nerves 2-12 grossly intact no focal lateralizing neurologic deficit.  Speech is fluent; uvula elevated with phonation, facial symmetry and tongue midline. DTR are normal bilaterally, cerebella exam is intact, barbinski is negative and strengths are equaled bilaterally.  No sensory loss.   Labs on Admission:  Basic Metabolic Panel:  Recent Labs Lab 07/14/15 0808 07/15/15 0514 07/16/15 0610  NA 139 136 138  K 3.3* 3.0* 4.4  CL 105 106 109  CO2 24 25 25   GLUCOSE 131* 125* 131*  BUN 21* 17 15  CREATININE 1.40* 1.23* 0.93  CALCIUM 8.3* 7.9* 8.3*   Liver Function Tests:  Recent Labs Lab 07/14/15 0808  AST 18  ALT 14  ALKPHOS 97  BILITOT 0.4  PROT 6.6  ALBUMIN 3.6   CBC:  Recent Labs Lab 07/14/15 0808 07/15/15 0514  WBC 7.4 6.7  NEUTROABS 5.9  --   HGB 13.0 12.1  HCT 40.4 37.6  MCV 95.5 96.2  PLT 124* 128*   Assessment/Plan Present  on Admission:  . CAP (community acquired pneumonia) . Hypertension . Morbid obesity ([place]) AKI    PLAN:  CAP:  Since she is still wheezing, and though she is getting better, she is not improving as quickly as I was hoping.  Therefore, will start PPI to Tx her reflux, continue with IV steroids, nebs, and will change Levoquin to IV Rocephin  and IV Zithromax.    Will obtain CT with contrast of the chest to evaluate her PNA, and I will consult pulmonary tomorrow as well.    AKI:  Resolved.  Continue with ACE I.   HTN:  Stable.  Morbid obesity:  S/p stomach stapling procedure many years ago.  Hypokalemia:  Supplemented.    Other plans as per orders. Code Status: FULL Haskel Khan, MD.  FACP [facility] Pager [phone] 7pm to 7am.  07/19/2015, 12:00 PM

## 2015-07-20 DIAGNOSIS — I10 Essential (primary) hypertension: Secondary | ICD-10-CM

## 2015-07-20 DIAGNOSIS — J189 Pneumonia, unspecified organism: Principal | ICD-10-CM

## 2015-07-20 MED ORDER — PREDNISONE 20 MG PO TABS
40.0000 mg | ORAL_TABLET | Freq: Every day | ORAL | Status: DC
  Administered 2015-07-21: 40 mg via ORAL
  Filled 2015-07-20: qty 2

## 2015-07-20 MED ORDER — GUAIFENESIN ER 600 MG PO TB12
1200.0000 mg | ORAL_TABLET | Freq: Two times a day (BID) | ORAL | Status: DC
  Administered 2015-07-20 – 2015-07-21 (×3): 1200 mg via ORAL
  Filled 2015-07-20 (×3): qty 2

## 2015-07-20 MED ORDER — AZITHROMYCIN 250 MG PO TABS: 500.0000 mg | ORAL_TABLET | Freq: Every day | ORAL | Status: DC

## 2015-07-20 MED ORDER — LEVOFLOXACIN 750 MG PO TABS
750.0000 mg | ORAL_TABLET | Freq: Every day | ORAL | Status: DC
  Administered 2015-07-21: 750 mg via ORAL
  Filled 2015-07-20: qty 1

## 2015-07-20 NOTE — Progress Notes (Signed)
PHARMACIST - PHYSICIAN COMMUNICATION DR:   TRH / [place] CONCERNING: Antibiotic IV to Oral Route Change Policy  RECOMMENDATION: This patient is receiving Azithromycin by the intravenous route.  Based on criteria approved by the Pharmacy and Therapeutics Committee, the antibiotic(s) is/are being converted to the equivalent oral dose form(s).   DESCRIPTION: These criteria include:  Patient being treated for a respiratory tract infection, urinary tract infection, cellulitis or clostridium difficile associated diarrhea if on metronidazole  The patient is not neutropenic and does not exhibit a GI malabsorption state  The patient is eating (either orally or via tube) and/or has been taking other orally administered medications for a least 24 hours  The patient is improving clinically and has a Tmax < 100.5  If you have questions about this conversion, please contact the Pharmacy Department  [x]   [phone] )  [facility] []   [phone] )  [facility] []   [phone] )  [facility] []   [phone] )  [facility] []   [phone] )  [place], [place], [facility]  07/20/2015 12:50 PM

## 2015-07-20 NOTE — Progress Notes (Addendum)
PROGRESS NOTE    [name] [name]  [id number] DOB: 29-Oct-1940 DOA: 07/14/2015 PCP: [name], MD   Brief Narrative:  29 yof with medical history of gastric stapling, morbid obesity, HTN, and recurrent PNAs presented with complaints of fever, productive cough and wheezes. She was seen by outpatient PCP where she reports receiving steroid shots. But has no improvement. Evaluation in the ED consisted of CXR which revealed left basilar PNA with no CHF. Started on IV levaquin with nebs with mild improvement but she still has persistent wheezing. She has been referred for admission.   Assessment & Plan:   Principal Problem:   CAP (community acquired pneumonia) Active Problems:   Hypertension   Morbid obesity ([place])   1. CAP, improving.  Currently on intravenous antibiotics. Follow-up imaging shows improving pneumonia. Will transition to oral antibiotics today. Continue nebs  and steroids. Change steroids to prednisone.  2. Hypokalemia. Replaced.  3. AKI, resolved on admission with IVF.  4. HTN, stable. 5. Morbid obesity. S/P stomach staple surgery.    DVT prophylaxis: Lovenox Code Status: Full Family Communication:  Disposition Plan: Anticipate discharge in 24-48 hours.    Consultants:   None  Procedures:     Antimicrobials:   Zithromax 5/17>>  Ceftriaxone 5/16>>   Subjective: Continues to have cough. Overall is feeling better. Does not feel back to baseline yet  Objective: Filed Vitals:   07/19/15 2035 07/19/15 2056 07/20/15 0626 07/20/15 0731  BP:  144/72 138/62   Pulse:  70 68   Temp:  98.1 F (36.7 C) 98.1 F (36.7 C)   TempSrc:  Oral Oral   Resp:  20 20   Height:      Weight:      SpO2: 96% 95% 94% 97%    Intake/Output Summary (Last 24 hours) at 07/20/15 1315 Last data filed at 07/19/15 1800  Gross per 24 hour  Intake    240 ml  Output      0 ml  Net    240 ml   Filed Weights   07/14/15 0735  Weight: 104.781 kg (231 lb)     Examination:  General exam: Appears calm and comfortable  Respiratory system: Occasional rhonchi bilaterally. Respiratory effort normal. Cardiovascular system: S1 & S2 heard, RRR. No JVD, murmurs, rubs, gallops or clicks. No pedal edema. Gastrointestinal system: Abdomen is nondistended, soft and nontender. No organomegaly or masses felt. Normal bowel sounds heard. Central nervous system: Alert and oriented. No focal neurological deficits. Extremities: Symmetric 5 x 5 power. Skin: No rashes, lesions or ulcers Psychiatry: Judgement and insight appear normal. Mood & affect appropriate.     Data Reviewed: I have personally reviewed following labs and imaging studies  CBC:  Recent Labs Lab 07/14/15 0808 07/15/15 0514  WBC 7.4 6.7  NEUTROABS 5.9  --   HGB 13.0 12.1  HCT 40.4 37.6  MCV 95.5 96.2  PLT 124* 0000000*   Basic Metabolic Panel:  Recent Labs Lab 07/14/15 0808 07/15/15 0514 07/16/15 0610  NA 139 136 138  K 3.3* 3.0* 4.4  CL 105 106 109  CO2 24 25 25   GLUCOSE 131* 125* 131*  BUN 21* 17 15  CREATININE 1.40* 1.23* 0.93  CALCIUM 8.3* 7.9* 8.3*   GFR: Estimated Creatinine Clearance: 61.5 mL/min (by C-G formula based on Cr of 0.93). Liver Function Tests:  Recent Labs Lab 07/14/15 0808  AST 18  ALT 14  ALKPHOS 97  BILITOT 0.4  PROT 6.6  ALBUMIN 3.6   No  results for input(s): LIPASE, AMYLASE in the last 168 hours. No results for input(s): AMMONIA in the last 168 hours. Coagulation Profile: No results for input(s): INR, PROTIME in the last 168 hours. Cardiac Enzymes: No results for input(s): CKTOTAL, CKMB, CKMBINDEX, TROPONINI in the last 168 hours. BNP (last 3 results) No results for input(s): PROBNP in the last 8760 hours. HbA1C: No results for input(s): HGBA1C in the last 72 hours. CBG: No results for input(s): GLUCAP in the last 168 hours. Lipid Profile: No results for input(s): CHOL, HDL, LDLCALC, TRIG, CHOLHDL, LDLDIRECT in the last 72  hours. Thyroid Function Tests: No results for input(s): TSH, T4TOTAL, FREET4, T3FREE, THYROIDAB in the last 72 hours. Anemia Panel: No results for input(s): VITAMINB12, FOLATE, FERRITIN, TIBC, IRON, RETICCTPCT in the last 72 hours. Urine analysis:    Component Value Date/Time   COLORURINE YELLOW 12/28/2013 1310   APPEARANCEUR CLEAR 12/28/2013 1310   LABSPEC 1.010 12/28/2013 1310   PHURINE 5.0 12/28/2013 1310   GLUCOSEU NEGATIVE 12/28/2013 1310   HGBUR NEGATIVE 12/28/2013 1310   BILIRUBINUR NEGATIVE 12/28/2013 1310   KETONESUR NEGATIVE 12/28/2013 1310   PROTEINUR NEGATIVE 12/28/2013 1310   UROBILINOGEN 0.2 12/28/2013 1310   NITRITE NEGATIVE 12/28/2013 1310   LEUKOCYTESUR NEGATIVE 12/28/2013 1310   Sepsis Labs: @LABRCNTIP (procalcitonin:4,lacticidven:4)  ) Recent Results (from the past 240 hour(s))  Blood culture (routine x 2)     Status: None   Collection Time: 07/14/15  8:04 AM  Result Value Ref Range Status   Specimen Description BLOOD LEFT ANTECUBITAL  Final   Special Requests   Final    BOTTLES DRAWN AEROBIC AND ANAEROBIC AEB=10CC ANA=7CC   Culture NO GROWTH 5 DAYS  Final   Report Status 07/19/2015 FINAL  Final  Blood culture (routine x 2)     Status: None   Collection Time: 07/14/15  8:04 AM  Result Value Ref Range Status   Specimen Description BLOOD LEFT HAND  Final   Special Requests   Final    BOTTLES DRAWN AEROBIC AND ANAEROBIC AEB=12CC ANA=8CC   Culture NO GROWTH 5 DAYS  Final   Report Status 07/19/2015 FINAL  Final         Radiology Studies: Ct Chest W Contrast  07/19/2015  CLINICAL DATA:  Cough and shortness of breath for 1 week EXAM: CT CHEST WITH CONTRAST TECHNIQUE: Multidetector CT imaging of the chest was performed during intravenous contrast administration. CONTRAST:  22mL ISOVUE-300 IOPAMIDOL (ISOVUE-300) INJECTION 61% COMPARISON:  07/14/2015 FINDINGS: Right lung is well aerated without focal infiltrate or sizable effusion. Minimal changes are noted  in the left lung base. There is been significant improvement over the past week from the prior plain film examination. A few calcified granulomas are noted. The thoracic inlet is within normal limits. The thoracic aorta and pulmonary artery are within normal limits. No pulmonary emboli are seen. No significant hilar or mediastinal adenopathy is noted. Minimal coronary calcifications are seen. Postsurgical changes in the right elbow are noted. Diffuse degenerative changes of the thoracic spine are seen. No acute compression deformity is noted. The visualized upper abdomen is within normal limits with the exception of postsurgical changes about the stomach. IMPRESSION: No evidence of pulmonary emboli. Near complete resolution of left basilar infiltrate when compared with the prior exam. No new focal abnormality is seen. Electronically Signed   By: Inez Catalina M.D.   On: 07/19/2015 15:29        Scheduled Meds: . albuterol  2.5 mg Nebulization Q6H  .  azithromycin  500 mg Oral QHS  . calcium-vitamin D  1 tablet Oral Daily  . cefTRIAXone (ROCEPHIN)  IV  1 g Intravenous Q24H  . docusate sodium  100 mg Oral QHS  . enoxaparin (LOVENOX) injection  40 mg Subcutaneous Q24H  . guaiFENesin  1,200 mg Oral BID  . lisinopril  20 mg Oral Daily  . methylPREDNISolone (SOLU-MEDROL) injection  60 mg Intravenous Q6H  . pantoprazole  40 mg Oral BID  . sertraline  100 mg Oral Daily  . traZODone  50 mg Oral QHS   Continuous Infusions:    LOS: 5 days    Time spent: 64mins   Kathie Dike, MD  [facility] Pager [phone]  If 7PM-7AM, please contact night-coverage [email or web address] Password [email or web address] 07/20/2015, 1:15 PM

## 2015-07-20 NOTE — Care Management Important Message (Signed)
Important Message  Patient Details  Name: [name] [name] MRN: [id number] Date of Birth: [birth year]   Medicare Important Message Given:  Yes    Sherald Barge, RN 07/20/2015, 9:30 AM

## 2015-07-20 NOTE — Consult Note (Signed)
Consult requested by: [facility] Consult requested for pneumonia:  HPI: This is a 75 year old who came to the emergency department with shortness of breath. She was coughing up greenish sputum. She was treated in the emergency department was not well enough for discharge. She was also found to have acute kidney injury and that has improved. She's been slow to respond and is still coughing up sputum and short of breath. She had CT chest yesterday that did not show pulmonary emboli and showed that her pneumonia is mostly resolved. She is a lifelong nonsmoker. No history of any pulmonary disease except recurrent pneumonia.  Past Medical History  Diagnosis Date  . PONV (postoperative nausea and vomiting)   . Hypertension   . Depression   . Seasonal allergies   . Pneumonia   . Anxiety   . GERD (gastroesophageal reflux disease)   . H/O hiatal hernia   . Arthritis   . Anemia   . Obesity   . Sepsis ([place]) 10/02/2013     Family History  Problem Relation Age of Onset  . Colon cancer Brother     IN HIS 60s-METASTATIC  . Hypertension Brother   . Colon polyps Paternal Grandfather   . Breast cancer Mother   . Hypertension Father      Social History   Social History  . Marital Status: Single    Spouse Name: N/A  . Number of Children: N/A  . Years of Education: N/A   Social History Main Topics  . Smoking status: Never Smoker   . Smokeless tobacco: Never Used  . Alcohol Use: No  . Drug Use: No  . Sexual Activity: Yes    Birth Control/ Protection: Surgical   Other Topics Concern  . None   Social History Narrative     ROS: She had some pleuritic chest pain. No hemoptysis except with discolored sputum. She has some chronic varicose veins and some swelling of her legs. No abdominal pain no nausea or vomiting. No HEENT symptoms except nasal discharge otherwise per the history and physical which I have reviewed    Objective: Vital signs in last 24 hours: Temp:  [98.1 F  (36.7 C)] 98.1 F (36.7 C) (05/17 0626) Pulse Rate:  [68-71] 68 (05/17 0626) Resp:  [18-20] 20 (05/17 0626) BP: (138-150)/(62-73) 138/62 mmHg (05/17 0626) SpO2:  [94 %-97 %] 97 % (05/17 0731) Weight change:  Last BM Date: 07/19/15  Intake/Output from previous day: 05/16 0701 - 05/17 0700 In: 720 [P.O.:720] Out: -   PHYSICAL EXShe is awake and alert. She is coughing during the examination. She is obese. Her pupils are reactive nose and throat are clear mucous membranes are moist her neck is supple without masses. Her chest shows some rhonchi this clears somewhat when she coughs. Her heart is regular without gallop. Her abdomen is soft. She does have some varicose veins in her extremities. Central nervous system exam is grossly intactShe is awake and alert. She is coughing during the examination. She is obese. Her pupils are reactive nose and throat are clear mucous membranes are moist her neck is supple without masses. Her chest shows some rhonchi this clears somewhat when she coughs. Her heart is regular without gallop. Her abdomen is soft. She does have some varicose veins in her extremities. Central nervous system exam is grossly intact  Lab Results: Basic Metabolic Panel: No results for input(s): NA, K, CL, CO2, GLUCOSE, BUN, CREATININE, CALCIUM, MG, PHOS in the last 72 hours. Liver Function Tests:  No results for input(s): AST, ALT, ALKPHOS, BILITOT, PROT, ALBUMIN in the last 72 hours. No results for input(s): LIPASE, AMYLASE in the last 72 hours. No results for input(s): AMMONIA in the last 72 hours. CBC: No results for input(s): WBC, NEUTROABS, HGB, HCT, MCV, PLT in the last 72 hours. Cardiac Enzymes: No results for input(s): CKTOTAL, CKMB, CKMBINDEX, TROPONINI in the last 72 hours. BNP: No results for input(s): PROBNP in the last 72 hours. D-Dimer: No results for input(s): DDIMER in the last 72 hours. CBG: No results for input(s): GLUCAP in the last 72 hours. Hemoglobin  A1C: No results for input(s): HGBA1C in the last 72 hours. Fasting Lipid Panel: No results for input(s): CHOL, HDL, LDLCALC, TRIG, CHOLHDL, LDLDIRECT in the last 72 hours. Thyroid Function Tests: No results for input(s): TSH, T4TOTAL, FREET4, T3FREE, THYROIDAB in the last 72 hours. Anemia Panel: No results for input(s): VITAMINB12, FOLATE, FERRITIN, TIBC, IRON, RETICCTPCT in the last 72 hours. Coagulation: No results for input(s): LABPROT, INR in the last 72 hours. Urine Drug Screen: Drugs of Abuse  No results found for: LABOPIA, COCAINSCRNUR, LABBENZ, AMPHETMU, THCU, LABBARB  Alcohol Level: No results for input(s): ETH in the last 72 hours. Urinalysis: No results for input(s): COLORURINE, LABSPEC, PHURINE, GLUCOSEU, HGBUR, BILIRUBINUR, KETONESUR, PROTEINUR, UROBILINOGEN, NITRITE, LEUKOCYTESUR in the last 72 hours.  Invalid input(s): APPERANCEUR Misc. Labs:   ABGS: No results for input(s): PHART, PO2ART, TCO2, HCO3 in the last 72 hours.  Invalid input(s): PCO2   MICROBIOLOGY: Recent Results (from the past 240 hour(s))  Blood culture (routine x 2)     Status: None   Collection Time: 07/14/15  8:04 AM  Result Value Ref Range Status   Specimen Description BLOOD LEFT ANTECUBITAL  Final   Special Requests   Final    BOTTLES DRAWN AEROBIC AND ANAEROBIC AEB=10CC ANA=7CC   Culture NO GROWTH 5 DAYS  Final   Report Status 07/19/2015 FINAL  Final  Blood culture (routine x 2)     Status: None   Collection Time: 07/14/15  8:04 AM  Result Value Ref Range Status   Specimen Description BLOOD LEFT HAND  Final   Special Requests   Final    BOTTLES DRAWN AEROBIC AND ANAEROBIC AEB=12CC ANA=8CC   Culture NO GROWTH 5 DAYS  Final   Report Status 07/19/2015 FINAL  Final    Studies/Results: Ct Chest W Contrast  07/19/2015  CLINICAL DATA:  Cough and shortness of breath for 1 week EXAM: CT CHEST WITH CONTRAST TECHNIQUE: Multidetector CT imaging of the chest was performed during intravenous  contrast administration. CONTRAST:  29mL ISOVUE-300 IOPAMIDOL (ISOVUE-300) INJECTION 61% COMPARISON:  07/14/2015 FINDINGS: Right lung is well aerated without focal infiltrate or sizable effusion. Minimal changes are noted in the left lung base. There is been significant improvement over the past week from the prior plain film examination. A few calcified granulomas are noted. The thoracic inlet is within normal limits. The thoracic aorta and pulmonary artery are within normal limits. No pulmonary emboli are seen. No significant hilar or mediastinal adenopathy is noted. Minimal coronary calcifications are seen. Postsurgical changes in the right elbow are noted. Diffuse degenerative changes of the thoracic spine are seen. No acute compression deformity is noted. The visualized upper abdomen is within normal limits with the exception of postsurgical changes about the stomach. IMPRESSION: No evidence of pulmonary emboli. Near complete resolution of left basilar infiltrate when compared with the prior exam. No new focal abnormality is seen. Electronically Signed  By: Inez Catalina M.D.   On: 07/19/2015 15:29    Medications:  Prior to Admission:  Prescriptions prior to admission  Medication Sig Dispense Refill Last Dose  . acetaminophen (TYLENOL) 325 MG tablet Take 650 mg by mouth every 6 (six) hours as needed for mild pain.   07/14/2015 at Unknown time  . Calcium Citrate-Vitamin D (CITRACAL PETITES/VITAMIN D) 200-250 MG-UNIT TABS Take 1 tablet by mouth daily.   07/13/2015 at Unknown time  . dextromethorphan-guaiFENesin (MUCINEX DM) 30-600 MG 12hr tablet Take 1 tablet by mouth 2 (two) times daily as needed for cough.   07/13/2015 at Unknown time  . diazepam (VALIUM) 5 MG tablet Take 5 mg by mouth 3 (three) times daily as needed. For anxiety   Past Week at Unknown time  . docusate sodium (COLACE) 100 MG capsule Take 100 mg by mouth at bedtime.    07/13/2015 at Unknown time  . fluocinonide cream (LIDEX) [id number] % Apply  1 application topically 2 (two) times daily. 30 g 11 07/13/2015 at Unknown time  . guaifenesin (ROBITUSSIN) 100 MG/5ML syrup Take 200 mg by mouth 3 (three) times daily as needed for cough.   07/13/2015 at Unknown time  . HYDROcodone-acetaminophen (NORCO) 10-325 MG per tablet Take 1 tablet by mouth every 6 (six) hours as needed for moderate pain.   07/13/2015 at Unknown time  . iron polysaccharides (NIFEREX) 150 MG capsule Take 1 capsule (150 mg total) by mouth 2 (two) times daily. 60 capsule 1 07/13/2015 at Unknown time  . lisinopril-hydrochlorothiazide (PRINZIDE,ZESTORETIC) 20-12.5 MG tablet 1 tablet daily.    07/13/2015 at Unknown time  . nystatin-triamcinolone ointment (MYCOLOG) Apply 1 application topically 2 (two) times daily. 30 g 11 Past Week at Unknown time  . pantoprazole (PROTONIX) 40 MG tablet 40 mg daily.    07/13/2015 at Unknown time  . potassium chloride (K-DUR) 10 MEQ tablet Take 10 mEq by mouth 3 (three) times daily with meals.    07/13/2015 at Unknown time  . sertraline (ZOLOFT) 100 MG tablet Take 100 mg by mouth daily.   07/13/2015 at Unknown time  . traZODone (DESYREL) 50 MG tablet Take 50 mg by mouth at bedtime.   07/13/2015 at Unknown time   Scheduled: . albuterol  2.5 mg Nebulization Q6H  . azithromycin  500 mg Intravenous Q24H  . calcium-vitamin D  1 tablet Oral Daily  . cefTRIAXone (ROCEPHIN)  IV  1 g Intravenous Q24H  . docusate sodium  100 mg Oral QHS  . enoxaparin (LOVENOX) injection  40 mg Subcutaneous Q24H  . guaiFENesin  1,200 mg Oral BID  . lisinopril  20 mg Oral Daily  . methylPREDNISolone (SOLU-MEDROL) injection  60 mg Intravenous Q6H  . pantoprazole  40 mg Oral BID  . sertraline  100 mg Oral Daily  . traZODone  50 mg Oral QHS   Continuous:  [id number] **OR** acetaminophen, albuterol, diazepam, guaiFENesin-dextromethorphan, HYDROcodone-acetaminophen, ondansetron **OR** ondansetron (ZOFRAN) IV  Assesment: She has community-acquired pneumonia and has been slow  to respond. She is on appropriate treatment. I added scheduled Mucinex and a flutter valve to see if it will make any difference. Principal Problem:   CAP (community acquired pneumonia) Active Problems:   Hypertension   Morbid obesity ([place])    Plan: Continue treatments. She does say she feels substantially better today    LOS: 5 days   Jaelie Aguilera L 07/20/2015, 8:19 AM

## 2015-07-21 DIAGNOSIS — N179 Acute kidney failure, unspecified: Secondary | ICD-10-CM

## 2015-07-21 DIAGNOSIS — E876 Hypokalemia: Secondary | ICD-10-CM

## 2015-07-21 LAB — CREATININE, SERUM
Creatinine, Ser: 1.09 mg/dL — ABNORMAL HIGH (ref 0.44–1.00)
GFR calc Af Amer: 57 mL/min — ABNORMAL LOW (ref >60)
GFR calc non Af Amer: 49 mL/min — ABNORMAL LOW (ref >60)

## 2015-07-21 MED ORDER — PREDNISONE 10 MG PO TABS: ORAL_TABLET | ORAL | Status: DC

## 2015-07-21 MED ORDER — ALBUTEROL SULFATE (2.5 MG/3ML) 0.083% IN NEBU: 2.5000 mg | INHALATION_SOLUTION | RESPIRATORY_TRACT | Status: AC | PRN

## 2015-07-21 NOTE — Care Management Note (Signed)
Case Management Note  Patient Details  Name: [name] [name] MRN: [id number] Date of Birth: 1941-02-07  Expected Discharge Date:  07/21/15               Expected Discharge Plan:  Home/Self Care  In-House Referral:  NA  Discharge planning Services  CM Consult  Post Acute Care Choice:  NA Choice offered to:  NA  DME Arranged:    DME Agency:     HH Arranged:    [place] Agency:     Status of Service:  Completed, signed off  Medicare Important Message Given:  Yes Date Medicare IM Given:    Medicare IM give by:    Date Additional Medicare IM Given:    Additional Medicare Important Message give by:     If discussed at [place] of Stay Meetings, dates discussed:    Additional Comments: Pt discharging home today with self care. No CM needs at this time.   Sherald Barge, RN 07/21/2015, 1:57 PM

## 2015-07-21 NOTE — Discharge Summary (Signed)
Physician Discharge Summary  [name] [name] [id number] DOB: 1940-04-03 DOA: 07/14/2015  PCP: [name], MD  Admit date: 07/14/2015 Discharge date: 07/21/2015  Time spent: 35 minutes  Recommendations for Outpatient Follow-up:  1. Follow-up with PCP in 1-2 weeks.   Discharge Diagnoses:  Principal Problem:   CAP (community acquired pneumonia) Active Problems:   Hypertension   Morbid obesity ([place])   AKI (acute kidney injury) ([place])   Hypokalemia   Discharge Condition: Improved   Diet recommendation: heart healthy   Filed Weights   07/14/15 0735  Weight: 104.781 kg (231 lb)    History of present illness:  49 yof with medical history of gastric stapling, morbid obesity, HTN, and recurrent PNAs presented with complaints of fever, productive cough and wheezes. She was seen by outpatient PCP where she reports receiving steroid shots. But has no improvement. Evaluation in the ED consisted of CXR which revealed left basilar PNA with no CHF. Started on IV levaquin with nebs with mild improvement but she still has persistent wheezing. She has been referred for admission.   Hospital Course:  Patient was admitted for CAP that was noted to show no improvement with outpatient abx. She was admitted and started on IV Levaquin, nebs and pulmonary hygiene with clinical improvement. She has completed and adequate course of abx. Since she had persistent wheezing pulmonology was consulted. SHe was placed on a course of steroids with improvement in her symptoms. Follow-up CT scan revealed improvement of pneumonia. She will discharge with nebs and steroid taper.  1. Hypokalemia. Replaced.  2. AKI likely related to dehydration. Resolved on admission with IVF.  3. HTN, stable. 4. Morbid obesity. S/P stomach staple surgery.  Procedures:  None  Consultations:  None  Discharge Exam: Filed Vitals:   07/21/15 0515 07/21/15 0800  BP: 155/57 133/49  Pulse: 44 89  Temp: 98.2 F (36.8 C)    Resp: 20 20     General: NAD, looks comfortable  Cardiovascular: RRR, S1, S2   Respiratory: clear bilaterally, No wheezing, rales or rhonci  Discharge Instructions   Discharge Instructions    AMB Referral to [facility]    Complete by:  As directed   Please refer patient to [facility] community for transition of care program to start upon discharge. Consent obtained.  Please contact RNCM with any questions or concerns.  Royetta Crochet. Niemczura, RN, BSN, [place] [phone]) Business Cell  [phone]) Toll Free Office  Reason for consult:  [facility] active  Diagnoses of:  COPD/ Pneumonia  Expected date of contact:  1-3 days (reserved for hospital discharges)     DME Nebulizer machine    Complete by:  As directed      Diet - low sodium heart healthy    Complete by:  As directed      Increase activity slowly    Complete by:  As directed           Current Discharge Medication List    START taking these medications   Details  albuterol (PROVENTIL) (2.5 MG/3ML) 0.083% nebulizer solution Take 3 mLs (2.5 mg total) by nebulization every 4 (four) hours as needed for wheezing. Qty: 75 mL, Refills: 12    predniSONE (DELTASONE) 10 MG tablet Take 40mg  po daily for 2 days then 30mg  daily for 2 days then 20mg  daily for 2 days then 10mg  daily for 2 days then stop Qty: 20 tablet, Refills: 0      CONTINUE these  medications which have NOT CHANGED   Details  acetaminophen (TYLENOL) 325 MG tablet Take 650 mg by mouth every 6 (six) hours as needed for mild pain.    Calcium Citrate-Vitamin D (CITRACAL PETITES/VITAMIN D) 200-250 MG-UNIT TABS Take 1 tablet by mouth daily.    dextromethorphan-guaiFENesin (MUCINEX DM) 30-600 MG 12hr tablet Take 1 tablet by mouth 2 (two) times daily as needed for cough.    diazepam (VALIUM) 5 MG tablet Take 5 mg by mouth 3 (three) times daily as needed. For anxiety    docusate sodium (COLACE) 100 MG capsule Take 100  mg by mouth at bedtime.     fluocinonide cream (LIDEX) AB-123456789 % Apply 1 application topically 2 (two) times daily. Qty: 30 g, Refills: 11    guaifenesin (ROBITUSSIN) 100 MG/5ML syrup Take 200 mg by mouth 3 (three) times daily as needed for cough.    HYDROcodone-acetaminophen (NORCO) 10-325 MG per tablet Take 1 tablet by mouth every 6 (six) hours as needed for moderate pain.    iron polysaccharides (NIFEREX) 150 MG capsule Take 1 capsule (150 mg total) by mouth 2 (two) times daily. Qty: 60 capsule, Refills: 1    lisinopril-hydrochlorothiazide (PRINZIDE,ZESTORETIC) 20-12.5 MG tablet 1 tablet daily.     nystatin-triamcinolone ointment (MYCOLOG) Apply 1 application topically 2 (two) times daily. Qty: 30 g, Refills: 11    pantoprazole (PROTONIX) 40 MG tablet 40 mg daily.     potassium chloride (K-DUR) 10 MEQ tablet Take 10 mEq by mouth 3 (three) times daily with meals.     sertraline (ZOLOFT) 100 MG tablet Take 100 mg by mouth daily.    traZODone (DESYREL) 50 MG tablet Take 50 mg by mouth at bedtime.      STOP taking these medications     lisinopril (PRINIVIL,ZESTRIL) 10 MG tablet        Allergies  Allergen Reactions  . Novocain [Procaine Hcl]     Unknown-passed out with novocaine injected for dental surgery.  . Other Hives    EKG pads - need to use pediatric pads  . Latex Rash  . Penicillins Rash  . Sulfa Antibiotics Rash      The results of significant diagnostics from this hospitalization (including imaging, microbiology, ancillary and laboratory) are listed below for reference.    Significant Diagnostic Studies: Dg Chest 2 View  07/14/2015  CLINICAL DATA:  Shortness of breath and productive cough EXAM: CHEST  2 VIEW COMPARISON:  07/12/2015 FINDINGS: Cardiac shadow is stable. The lungs are well aerated bilaterally. New left basilar infiltrate is seen when compare with the prior study. Postsurgical changes are again noted. IMPRESSION: New left basilar infiltrate.  Electronically Signed   By: Inez Catalina M.D.   On: 07/14/2015 08:56   Ct Chest W Contrast  07/19/2015  CLINICAL DATA:  Cough and shortness of breath for 1 week EXAM: CT CHEST WITH CONTRAST TECHNIQUE: Multidetector CT imaging of the chest was performed during intravenous contrast administration. CONTRAST:  95mL ISOVUE-300 IOPAMIDOL (ISOVUE-300) INJECTION 61% COMPARISON:  07/14/2015 FINDINGS: Right lung is well aerated without focal infiltrate or sizable effusion. Minimal changes are noted in the left lung base. There is been significant improvement over the past week from the prior plain film examination. A few calcified granulomas are noted. The thoracic inlet is within normal limits. The thoracic aorta and pulmonary artery are within normal limits. No pulmonary emboli are seen. No significant hilar or mediastinal adenopathy is noted. Minimal coronary calcifications are seen. Postsurgical changes in the right elbow  are noted. Diffuse degenerative changes of the thoracic spine are seen. No acute compression deformity is noted. The visualized upper abdomen is within normal limits with the exception of postsurgical changes about the stomach. IMPRESSION: No evidence of pulmonary emboli. Near complete resolution of left basilar infiltrate when compared with the prior exam. No new focal abnormality is seen. Electronically Signed   By: Inez Catalina M.D.   On: 07/19/2015 15:29    Microbiology: Recent Results (from the past 240 hour(s))  Blood culture (routine x 2)     Status: None   Collection Time: 07/14/15  8:04 AM  Result Value Ref Range Status   Specimen Description BLOOD LEFT ANTECUBITAL  Final   Special Requests   Final    BOTTLES DRAWN AEROBIC AND ANAEROBIC AEB=10CC ANA=7CC   Culture NO GROWTH 5 DAYS  Final   Report Status 07/19/2015 FINAL  Final  Blood culture (routine x 2)     Status: None   Collection Time: 07/14/15  8:04 AM  Result Value Ref Range Status   Specimen Description BLOOD LEFT HAND   Final   Special Requests   Final    BOTTLES DRAWN AEROBIC AND ANAEROBIC AEB=12CC ANA=8CC   Culture NO GROWTH 5 DAYS  Final   Report Status 07/19/2015 FINAL  Final     Labs: Basic Metabolic Panel:  Recent Labs Lab 07/15/15 0514 07/16/15 0610 07/21/15 0415  NA 136 138  --   K 3.0* 4.4  --   CL 106 109  --   CO2 25 25  --   GLUCOSE 125* 131*  --   BUN 17 15  --   CREATININE 1.23* 0.93 1.09*  CALCIUM 7.9* 8.3*  --    CBC:  Recent Labs Lab 07/15/15 0514  WBC 6.7  HGB 12.1  HCT 37.6  MCV 96.2  PLT 128*    Signed:  Kathie Dike, MD   [facility] 07/21/2015, 11:09 AM     By signing my name below, I, Rennis Harding, attest that this documentation has been prepared under the direction and in the presence of Kathie Dike, MD. Electronically signed: Rennis Harding, Scribe. 07/21/2015 10:55am.  I, Dr. Kathie Dike, personally performed the services described in this documentaiton. All medical record entries made by the scribe were at my direction and in my presence. I have reviewed the chart and agree that the record reflects my personal performance and is accurate and complete  Kathie Dike, MD, 07/21/2015 11:09 AM

## 2015-07-21 NOTE — Progress Notes (Signed)
Subjective: She says she feels better and wants to go home. She is still coughing some.  Objective: Vital signs in last 24 hours: Temp:  [98.2 F (36.8 C)-98.4 F (36.9 C)] 98.2 F (36.8 C) (05/18 0515) Pulse Rate:  [44-74] 44 (05/18 0515) Resp:  [16-20] 20 (05/18 0515) BP: (134-171)/(57-68) 155/57 mmHg (05/18 0515) SpO2:  [92 %-98 %] 92 % (05/18 0729) Weight change:  Last BM Date: 07/19/15  Intake/Output from previous day: 05/17 0701 - 05/18 0700 In: 480 [P.O.:480] Out: -   PHYSICAL EXAM General appearance: alert, cooperative and no distress Resp: She is still coughing and has wheezing when she coughs but her chest is clear when she is not coughing Cardio: regular rate and rhythm, S1, S2 normal, no murmur, click, rub or gallop GI: soft, non-tender; bowel sounds normal; no masses,  no organomegaly Extremities: extremities normal, atraumatic, no cyanosis or edema  Lab Results:  Results for orders placed or performed during the hospital encounter of 07/14/15 (from the past 48 hour(s))  Creatinine, serum     Status: Abnormal   Collection Time: 07/21/15  4:15 AM  Result Value Ref Range   Creatinine, Ser 1.09 (H) 0.44 - 1.00 mg/dL   GFR calc non Af Amer 49 (L) >60 mL/min   GFR calc Af Amer 57 (L) >60 mL/min    Comment: (NOTE) The eGFR has been calculated using the CKD EPI equation. This calculation has not been validated in all clinical situations. eGFR's persistently <60 mL/min signify possible Chronic Kidney Disease.     ABGS No results for input(s): PHART, PO2ART, TCO2, HCO3 in the last 72 hours.  Invalid input(s): PCO2 CULTURES Recent Results (from the past 240 hour(s))  Blood culture (routine x 2)     Status: None   Collection Time: 07/14/15  8:04 AM  Result Value Ref Range Status   Specimen Description BLOOD LEFT ANTECUBITAL  Final   Special Requests   Final    BOTTLES DRAWN AEROBIC AND ANAEROBIC AEB=10CC ANA=7CC   Culture NO GROWTH 5 DAYS  Final   Report  Status 07/19/2015 FINAL  Final  Blood culture (routine x 2)     Status: None   Collection Time: 07/14/15  8:04 AM  Result Value Ref Range Status   Specimen Description BLOOD LEFT HAND  Final   Special Requests   Final    BOTTLES DRAWN AEROBIC AND ANAEROBIC AEB=12CC ANA=8CC   Culture NO GROWTH 5 DAYS  Final   Report Status 07/19/2015 FINAL  Final   Studies/Results: Ct Chest W Contrast  07/19/2015  CLINICAL DATA:  Cough and shortness of breath for 1 week EXAM: CT CHEST WITH CONTRAST TECHNIQUE: Multidetector CT imaging of the chest was performed during intravenous contrast administration. CONTRAST:  86m ISOVUE-300 IOPAMIDOL (ISOVUE-300) INJECTION 61% COMPARISON:  07/14/2015 FINDINGS: Right lung is well aerated without focal infiltrate or sizable effusion. Minimal changes are noted in the left lung base. There is been significant improvement over the past week from the prior plain film examination. A few calcified granulomas are noted. The thoracic inlet is within normal limits. The thoracic aorta and pulmonary artery are within normal limits. No pulmonary emboli are seen. No significant hilar or mediastinal adenopathy is noted. Minimal coronary calcifications are seen. Postsurgical changes in the right elbow are noted. Diffuse degenerative changes of the thoracic spine are seen. No acute compression deformity is noted. The visualized upper abdomen is within normal limits with the exception of postsurgical changes about the stomach. IMPRESSION: No  evidence of pulmonary emboli. Near complete resolution of left basilar infiltrate when compared with the prior exam. No new focal abnormality is seen. Electronically Signed   By: Inez Catalina M.D.   On: 07/19/2015 15:29    Medications:  Prior to Admission:  Prescriptions prior to admission  Medication Sig Dispense Refill Last Dose  . acetaminophen (TYLENOL) 325 MG tablet Take 650 mg by mouth every 6 (six) hours as needed for mild pain.   07/14/2015 at  Unknown time  . Calcium Citrate-Vitamin D (CITRACAL PETITES/VITAMIN D) 200-250 MG-UNIT TABS Take 1 tablet by mouth daily.   07/13/2015 at Unknown time  . dextromethorphan-guaiFENesin (MUCINEX DM) 30-600 MG 12hr tablet Take 1 tablet by mouth 2 (two) times daily as needed for cough.   07/13/2015 at Unknown time  . diazepam (VALIUM) 5 MG tablet Take 5 mg by mouth 3 (three) times daily as needed. For anxiety   Past Week at Unknown time  . docusate sodium (COLACE) 100 MG capsule Take 100 mg by mouth at bedtime.    07/13/2015 at Unknown time  . fluocinonide cream (LIDEX) 4.70 % Apply 1 application topically 2 (two) times daily. 30 g 11 07/13/2015 at Unknown time  . guaifenesin (ROBITUSSIN) 100 MG/5ML syrup Take 200 mg by mouth 3 (three) times daily as needed for cough.   07/13/2015 at Unknown time  . HYDROcodone-acetaminophen (NORCO) 10-325 MG per tablet Take 1 tablet by mouth every 6 (six) hours as needed for moderate pain.   07/13/2015 at Unknown time  . iron polysaccharides (NIFEREX) 150 MG capsule Take 1 capsule (150 mg total) by mouth 2 (two) times daily. 60 capsule 1 07/13/2015 at Unknown time  . lisinopril-hydrochlorothiazide (PRINZIDE,ZESTORETIC) 20-12.5 MG tablet 1 tablet daily.    07/13/2015 at Unknown time  . nystatin-triamcinolone ointment (MYCOLOG) Apply 1 application topically 2 (two) times daily. 30 g 11 Past Week at Unknown time  . pantoprazole (PROTONIX) 40 MG tablet 40 mg daily.    07/13/2015 at Unknown time  . potassium chloride (K-DUR) 10 MEQ tablet Take 10 mEq by mouth 3 (three) times daily with meals.    07/13/2015 at Unknown time  . sertraline (ZOLOFT) 100 MG tablet Take 100 mg by mouth daily.   07/13/2015 at Unknown time  . traZODone (DESYREL) 50 MG tablet Take 50 mg by mouth at bedtime.   07/13/2015 at Unknown time   Scheduled: . albuterol  2.5 mg Nebulization Q6H  . calcium-vitamin D  1 tablet Oral Daily  . docusate sodium  100 mg Oral QHS  . enoxaparin (LOVENOX) injection  40 mg  Subcutaneous Q24H  . guaiFENesin  1,200 mg Oral BID  . levofloxacin  750 mg Oral Daily  . lisinopril  20 mg Oral Daily  . pantoprazole  40 mg Oral BID  . predniSONE  40 mg Oral Q breakfast  . sertraline  100 mg Oral Daily  . traZODone  50 mg Oral QHS   Continuous:  [id number] **OR** acetaminophen, albuterol, diazepam, guaiFENesin-dextromethorphan, HYDROcodone-acetaminophen, ondansetron **OR** ondansetron (ZOFRAN) IV  Assesment: She was admitted with community-acquired pneumonia. Chest x-ray shows improvement. She is on oral antibiotics now. She is on prednisone now. I think she is probably okay from a strictly pulmonary point of view to go home. Principal Problem:   CAP (community acquired pneumonia) Active Problems:   Hypertension   Morbid obesity ([place])    Plan: Potential discharge later today    LOS: 6 days   Trulee Hamstra L 07/21/2015, 8:35 AM

## 2015-07-21 NOTE — Progress Notes (Signed)
Pt's IV catheter removed and intact. Pt's IV site clean dry and intact. Discharge instructions including medications and follow up appointments were reviewed and discussed with patient. Pt verbalized understanding of discharge instructions including medications and follow up appointments. All questions were answered and no further questions at this time. Pt in stable condition and in no acute distress at time of discharge. Pt will be escorted by nurse tech.  

## 2015-07-22 ENCOUNTER — Encounter: Payer: Self-pay | Admitting: *Deleted

## 2015-07-22 ENCOUNTER — Other Ambulatory Visit: Payer: Self-pay | Admitting: *Deleted

## 2015-07-22 NOTE — Patient Outreach (Signed)
07/22/15- Pt discharged [facility] 07/21/15 with diagnosis pneumonia, telephone call to pt for transition of care week 1, spoke with pt, HIPAA verified, pt reports she is getting her albuterol and prednisone filled today, pt states she does not have nebulizer machine and does not know where to obtain one. Pt states [facility] picks up her prescriptions for her and then delivers them.  Pt drives to appointments, no home health ordered.  See care plan for resolution medication issue lisinopril. RN CM talked with Amy at [facility] and they are picking up pt prescriptions today and will deliver to her home- albuterol and prednisone and they do not supply nebulizer machines.  RN CM faxed transition of care note and barrier letter to primary care MD Dr. Nadara Mustard and ask if MD could advise pt on the lisinopril, as for now, pt will not take per discharge instructions. Per Jolene Provost CM at [facility], prescription for nebulizer being sent to [facility] today.  RN CM checked with pt later and pt states nebulizer has been delivered to her home and her niece will provide oversight to make sure pt is using correctly. [facility] CM Care Plan Problem One        Most Recent Value   Care Plan Problem One  Knowledge deficit related to pneumonia   Role Documenting the Problem One  Care Management Coordinator   Care Plan for Problem One  Active   [facility]N Long Term Goal (31-90 days)  pt will have no readmissions related to pneumonia within 90 days   [facility]N Long Term Goal Start Date  07/22/15   Interventions for Problem One Long Term Goal  RN CM reviewed signs/ symptoms pneumonia and importance of calling MD early for change in health status/ symptoms, RN CM called Jolene Provost case manager at [facility] and left voicemail requesting return phone call and reported RN CM needs to know where nebulizer DME ordered from as pt reports she does not have the machine at home.  RN CM reviewed all medications  with pt, DC summary states to stop lisinopril, pt unaware and does not see this on her DC summary, RNCM faxed note to Dr. Lyman Speller office and ask MD to advise pt, RN CM ask pt to call Dr. Nadara Mustard and make post hospital follow up appointment for 7-10 days, pt states she will do this today.  RN CM reviwed DC instructions with pt., gave 24 hour nurse advice line number.   [facility] CM Short Term Goal #1 (0-30 days)  pt will call and make post hospital follow up appointment with primary care MD (appointment for 7-10 days) within one week   [facility] CM Short Term Goal #1 Start Date  07/22/15   Interventions for Short Term Goal #1  RN CM reviewed importance of post follow up appointment and ask pt to take all her medications to the appointment.     Outpatient Encounter Prescriptions as of 07/22/2015  Medication Sig Note  . acetaminophen (TYLENOL) 325 MG tablet Take 650 mg by mouth every 6 (six) hours as needed for mild pain.   . Calcium Citrate-Vitamin D (CITRACAL PETITES/VITAMIN D) 200-250 MG-UNIT TABS Take 1 tablet by mouth daily.   Marland Kitchen dextromethorphan-guaiFENesin (MUCINEX DM) 30-600 MG 12hr tablet Take 1 tablet by mouth 2 (two) times daily as needed for cough.   . diazepam (VALIUM) 5 MG tablet Take 5 mg by mouth 3 (three) times daily as needed. For anxiety   . docusate sodium (  COLACE) 100 MG capsule Take 100 mg by mouth at bedtime.    . fluocinonide cream (LIDEX) [id number] % Apply 1 application topically 2 (two) times daily.   Marland Kitchen guaifenesin (ROBITUSSIN) 100 MG/5ML syrup Take 200 mg by mouth 3 (three) times daily as needed for cough.   Marland Kitchen HYDROcodone-acetaminophen (NORCO) 10-325 MG per tablet Take 1 tablet by mouth every 6 (six) hours as needed for moderate pain.   . iron polysaccharides (NIFEREX) 150 MG capsule Take 1 capsule (150 mg total) by mouth 2 (two) times daily.   Marland Kitchen nystatin-triamcinolone ointment (MYCOLOG) Apply 1 application topically 2 (two) times daily.   . pantoprazole (PROTONIX) 40 MG tablet 40 mg daily.   07/14/2015: Received from: External Pharmacy  . potassium chloride (K-DUR) 10 MEQ tablet Take 10 mEq by mouth 3 (three) times daily with meals.    . sertraline (ZOLOFT) 100 MG tablet Take 100 mg by mouth daily.   . traZODone (DESYREL) 50 MG tablet Take 50 mg by mouth at bedtime.   Marland Kitchen albuterol (PROVENTIL) (2.5 MG/3ML) 0.083% nebulizer solution Take 3 mLs (2.5 mg total) by nebulization every 4 (four) hours as needed for wheezing. (Patient not taking: Reported on 07/22/2015) 07/22/2015: Pt has script, getting filled today  . lisinopril-hydrochlorothiazide (PRINZIDE,ZESTORETIC) 20-12.5 MG tablet 1 tablet daily. Reported on 07/22/2015 07/22/2015: Reviewed with pt that DC summary states not to take  . predniSONE (DELTASONE) 10 MG tablet Take 40mg  po daily for 2 days then 30mg  daily for 2 days then 20mg  daily for 2 days then 10mg  daily for 2 days then stop (Patient not taking: Reported on 07/22/2015) 07/22/2015: Pt has script- getting filled today   No facility-administered encounter medications on file as of 07/22/2015.    PLAN Continue weekly transition of care calls See pt for initial home visit 08/03/15  Jacqlyn Larsen [facility], [place] Coordinator [phone]

## 2015-07-22 NOTE — Care Management (Signed)
CM contacted by Almyra Free from [facility]. Pt needs neb machine that was ordered on discharge. Pt's neb order sent to [facility] pt does not have qualifying dx but [facility] will contact pt to determine best method of payment or tempt medicare coverage of DME. [facility] will be able to deliver neb machine today.

## 2015-07-27 ENCOUNTER — Other Ambulatory Visit: Payer: Self-pay | Admitting: *Deleted

## 2015-07-27 ENCOUNTER — Encounter: Payer: Self-pay | Admitting: *Deleted

## 2015-07-27 NOTE — Patient Outreach (Signed)
Transition of care call #2 - Pt states her breathing has improved but she has a slight sore throat and she says her uvula is red. She takes nebs tx but only albuterol. She is complaining for bilateral shoulder pain. She would like to get an injection into one of them. She does use a topical muscle rub at times to help.   Pt does not currently use a med box but this may make her life a little simpler. I will pass this on the Jacqlyn Larsen, RN.  Pt does have an MD appt with Dr. Nadara Mustard on the 5/31st. Perhaps pt may benefit from getting and using some Voltaren gel for her shoulder pain?  I gave pt my number and told her she could call me while Donn Pierini is gone.  Deloria Lair [facility] [place] [phone]

## 2015-08-03 ENCOUNTER — Encounter: Payer: Self-pay | Admitting: *Deleted

## 2015-08-03 ENCOUNTER — Other Ambulatory Visit: Payer: Self-pay | Admitting: *Deleted

## 2015-08-03 DIAGNOSIS — J189 Pneumonia, unspecified organism: Secondary | ICD-10-CM | POA: Diagnosis not present

## 2015-08-03 NOTE — Patient Outreach (Signed)
[facility]) Care Management   08/03/2015  [name] [name] 08-17-1940 [id number]  [name] [name] is an 75 y.o. female  Subjective: Initial home visit with pt, HIPAA verified, pt reports she is still coughing up "brown mucus" since having pneumonia and temperature today 99.4.  Pt states she is seeing primary MD Dr. Nadara Mustard today.  Pt reports she "looks after my own meds" and does not want a med box.  Pt reports she still drives and her niece assists her.  Objective:   Filed Vitals:   08/03/15 1241  BP: 124/64  Pulse: 84  Resp: 18  Height: 1.6 m (5' 3")  Weight: 231 lb (104.781 kg)  SpO2: 96%   ROS  Physical Exam  Constitutional: She is oriented to person, place, and time. She appears well-developed and well-nourished.  HENT:  Head: Normocephalic.  Neck: Normal range of motion.  Cardiovascular: Normal rate and regular rhythm.   Respiratory: Effort normal.  Breath sounds slightly diminished upper and lower lobes posteriorally  GI: Soft. Bowel sounds are normal.  Musculoskeletal: Normal range of motion. She exhibits no edema.  Neurological: She is alert and oriented to person, place, and time.  Skin: Skin is warm and dry.  Psychiatric: She has a normal mood and affect. Her behavior is normal. Judgment and thought content normal.    Encounter Medications:   Outpatient Encounter Prescriptions as of 08/03/2015  Medication Sig Note  . acetaminophen (TYLENOL) 325 MG tablet Take 650 mg by mouth every 6 (six) hours as needed for mild pain.   Marland Kitchen albuterol (PROVENTIL) (2.5 MG/3ML) 0.083% nebulizer solution Take 3 mLs (2.5 mg total) by nebulization every 4 (four) hours as needed for wheezing. 07/22/2015: Pt has script, getting filled today  . Calcium Citrate-Vitamin D (CITRACAL PETITES/VITAMIN D) 200-250 MG-UNIT TABS Take 1 tablet by mouth daily.   Marland Kitchen dextromethorphan-guaiFENesin (MUCINEX DM) 30-600 MG 12hr tablet Take 1 tablet by mouth 2 (two) times daily as needed for cough.    . diazepam (VALIUM) 5 MG tablet Take 5 mg by mouth 3 (three) times daily as needed. For anxiety   . docusate sodium (COLACE) 100 MG capsule Take 100 mg by mouth at bedtime.    . fluocinonide cream (LIDEX) 4.09 % Apply 1 application topically 2 (two) times daily.   Marland Kitchen guaifenesin (ROBITUSSIN) 100 MG/5ML syrup Take 200 mg by mouth 3 (three) times daily as needed for cough.   Marland Kitchen HYDROcodone-acetaminophen (NORCO) 10-325 MG per tablet Take 1 tablet by mouth every 6 (six) hours as needed for moderate pain.   . iron polysaccharides (NIFEREX) 150 MG capsule Take 1 capsule (150 mg total) by mouth 2 (two) times daily.   Marland Kitchen nystatin-triamcinolone ointment (MYCOLOG) Apply 1 application topically 2 (two) times daily.   . pantoprazole (PROTONIX) 40 MG tablet 40 mg daily.  07/14/2015: Received from: External Pharmacy  . potassium chloride (K-DUR) 10 MEQ tablet Take 10 mEq by mouth 3 (three) times daily with meals.    . sertraline (ZOLOFT) 100 MG tablet Take 100 mg by mouth daily.   . traZODone (DESYREL) 50 MG tablet Take 50 mg by mouth at bedtime.   Marland Kitchen lisinopril-hydrochlorothiazide (PRINZIDE,ZESTORETIC) 20-12.5 MG tablet 1 tablet daily. Reported on 08/03/2015 07/22/2015: Reviewed with pt that DC summary states not to take  . predniSONE (DELTASONE) 10 MG tablet Take 64m po daily for 2 days then 368mdaily for 2 days then 2064maily for 2 days then 65m69mily for 2 days then stop (Patient  not taking: Reported on 08/03/2015) 08/03/2015: Pt finished prednisone   No facility-administered encounter medications on file as of 08/03/2015.    Functional Status:   In your present state of health, do you have any difficulty performing the following activities: 08/03/2015 07/27/2015  Hearing? N -  Vision? N -  Difficulty concentrating or making decisions? N -  Walking or climbing stairs? N -  Dressing or bathing? N -  Doing errands, shopping? N -  Preparing Food and eating ? N N  Using the Toilet? N N  In the past six  months, have you accidently leaked urine? N N  Do you have problems with loss of bowel control? N N  Managing your Medications? N N  Managing your Finances? N N  Housekeeping or managing your Housekeeping? N N    Fall/Depression Screening:    PHQ 2/9 Scores 08/03/2015 07/27/2015  PHQ - 2 Score 1 1   Fall Risk  08/03/2015 07/27/2015  Falls in the past year? No No  Risk for fall due to : Impaired balance/gait;Impaired mobility History of fall(s);Impaired balance/gait;Impaired mobility;Medication side effect    Assessment:  RN CM gave pt [facility]N calendar and reviewed with pt, reviewed signs/ symptoms infection, exacerbation of pneumonia, RN CM faxed barrier letter and initial home visit to primary MD Dr. Nadara Mustard.  THN CM Care Plan Problem One        Most Recent Value   Care Plan Problem One  Knowledge deficit related to pneumonia   Role Documenting the Problem One  Care Management Coordinator   Care Plan for Problem One  Active   THN Long Term Goal (31-90 days)  pt will have no readmissions related to pneumonia within 90 days   THN Long Term Goal Start Date  07/22/15   Interventions for Problem One Long Term Goal  RN CM reviewed use of nebulizer including cleaning, pt has instruction manuel and is independent with using.  RN CM reviewed EMMI handouts related to pneumonia.  RN CM faxed letter to Dr. Nadara Mustard reporting pt has temperature of 99.4 and is expectoring small amounts brownish colored mucus.RN CM gave pt 24 hour nurseline magnet and reviewed using this service.   THN CM Short Term Goal #1 (0-30 days)  pt will call and make post hospital follow up appointment with primary care MD (appointment for 7-10 days) within one week   [facility] CM Short Term Goal #1 Start Date  07/22/15   [facility] CM Short Term Goal #1 Met Date  08/03/15   Interventions for Short Term Goal #1  pt has appointment with primary care Dr. Nadara Mustard today 08/03/15 at 330 pm.    [facility] CM Care Plan Problem Two        Most Recent Value   Care  Plan Problem Two  Knowledge deficit related to low sodium diet   Role Documenting the Problem Two  Care Management Coordinator   Care Plan for Problem Two  Active   THN CM Short Term Goal #1 (0-30 days)  Pt will verbalize foods high in sodium   THN CM Short Term Goal #1 Start Date  08/03/15   Interventions for Short Term Goal #2   RN CM reviewed foods high in sodium to limit, ask pt to be mindful of reading labels       Plan:  Continue weekly transition of care calls See pt for home visit 08/24/15 Follow up MD visit 08/03/15  Jacqlyn Larsen [facility], [place] Coordinator [phone]

## 2015-08-08 ENCOUNTER — Other Ambulatory Visit: Payer: Self-pay | Admitting: *Deleted

## 2015-08-08 NOTE — Patient Outreach (Signed)
08/08/15- Telephone call to patient for transition of care week 4, spoke with pt , HIPAA verified, pt reports she saw MD 08/03/15 and has been taking antibiotic and prednisone and states " I do feel better"  Pt reports she knows to call MD if symptoms worsen.  Pt reports she has small amount mucus that is yellowish in color, reports she has all medications and taking as prescribed.  RN CM reviewed action plan.  THN CM Care Plan Problem One        Most Recent Value   Care Plan Problem One  Knowledge deficit related to pneumonia   Role Documenting the Problem One  Care Management Coordinator   Care Plan for Problem One  Active   THN Long Term Goal (31-90 days)  pt will have no readmissions related to pneumonia within 90 days   THN Long Term Goal Start Date  07/22/15   Interventions for Problem One Long Term Goal  RN CM talked with pt about seeing MD on 08/03/15 and now prescribed antibiotic zithromax (finished) and keflex as well as prednisone, reviewed importance of taking all the medication.   [facility]N CM Short Term Goal #1 (0-30 days)  pt will call and make post hospital follow up appointment with primary care MD (appointment for 7-10 days) within one week   [facility] CM Short Term Goal #1 Start Date  07/22/15   [facility] CM Short Term Goal #1 Met Date  08/03/15   Interventions for Short Term Goal #1  pt has appointment with primary care Dr. Nadara Mustard today 08/03/15 at 330 pm.    [facility] CM Care Plan Problem Two        Most Recent Value   Care Plan Problem Two  Knowledge deficit related to low sodium diet   Role Documenting the Problem Two  Care Management [place] for Problem Two  Active   THN CM Short Term Goal #1 (0-30 days)  Pt will verbalize foods high in sodium   THN CM Short Term Goal #1 Start Date  08/03/15   Interventions for Short Term Goal #2   RN CM reviewed foods high in sodium to limit, ask pt to be mindful of reading labels       PLAN See pt for home visit 08/24/15  Jacqlyn Larsen [facility],  [place] Coordinator [phone]

## 2015-08-18 DIAGNOSIS — C44319 Basal cell carcinoma of skin of other parts of face: Secondary | ICD-10-CM | POA: Diagnosis not present

## 2015-08-24 ENCOUNTER — Other Ambulatory Visit: Payer: Self-pay | Admitting: *Deleted

## 2015-08-24 ENCOUNTER — Encounter: Payer: Self-pay | Admitting: *Deleted

## 2015-08-24 NOTE — Patient Outreach (Signed)
[facility]) Care Management   08/24/2015  [name] [name] 10/28/1940 [id number]  [name] [name] is an 75 y.o. 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  Pt reports temperature has been normal, uses nebulizer treatments as ordered.    Objective:   . Filed Vitals:   08/24/15 1809  BP: 120/64  Pulse: 61  Resp: 18  SpO2: 98%   ROS  Physical Exam  Constitutional: She is oriented to person, place, and time. She appears well-developed and well-nourished.  HENT:  Head: Normocephalic.  Neck: Normal range of motion. Neck supple.  Cardiovascular: Normal rate and regular rhythm.   Respiratory: Effort normal and breath sounds normal.  GI: Soft. Bowel sounds are normal.  Musculoskeletal: Normal range of motion. She exhibits no edema.  Neurological: She is alert and oriented to person, place, and time.  Skin: Skin is warm and dry.  Varicose veins noted lower extremities bil  Psychiatric: She has a normal mood and affect. Her behavior is normal. Judgment and thought content normal.    Encounter Medications:   Outpatient Encounter Prescriptions as of 08/24/2015  Medication Sig Note  . acetaminophen (TYLENOL) 325 MG tablet Take 650 mg by mouth every 6 (six) hours as needed for mild pain.   Marland Kitchen albuterol (PROVENTIL) (2.5 MG/3ML) 0.083% nebulizer solution Take 3 mLs (2.5 mg total) by nebulization every 4 (four) hours as needed for wheezing. 07/22/2015: Pt has script, getting filled today  . Calcium Citrate-Vitamin D (CITRACAL PETITES/VITAMIN D) 200-250 MG-UNIT TABS Take 1 tablet by mouth daily.   Marland Kitchen dextromethorphan-guaiFENesin (MUCINEX DM) 30-600 MG 12hr tablet Take 1 tablet by mouth 2 (two) times daily as needed for cough.   . diazepam (VALIUM) 5 MG tablet Take 5 mg by mouth 3 (three) times daily as  needed. For anxiety   . docusate sodium (COLACE) 100 MG capsule Take 100 mg by mouth at bedtime.    . fluocinonide cream (LIDEX) [id number] % Apply 1 application topically 2 (two) times daily.   Marland Kitchen guaifenesin (ROBITUSSIN) 100 MG/5ML syrup Take 200 mg by mouth 3 (three) times daily as needed for cough.   Marland Kitchen HYDROcodone-acetaminophen (NORCO) 10-325 MG per tablet Take 1 tablet by mouth every 6 (six) hours as needed for moderate pain.   . iron polysaccharides (NIFEREX) 150 MG capsule Take 1 capsule (150 mg total) by mouth 2 (two) times daily.   Marland Kitchen lisinopril-hydrochlorothiazide (PRINZIDE,ZESTORETIC) 20-12.5 MG tablet 1 tablet daily. Reported on 08/03/2015 07/22/2015: Reviewed with pt that DC summary states not to take  . nystatin-triamcinolone ointment (MYCOLOG) Apply 1 application topically 2 (two) times daily.   . pantoprazole (PROTONIX) 40 MG tablet 40 mg daily.  07/14/2015: Received from: External Pharmacy  . potassium chloride (K-DUR) 10 MEQ tablet Take 10 mEq by mouth 3 (three) times daily with meals.    . sertraline (ZOLOFT) 100 MG tablet Take 100 mg by mouth daily.   . traZODone (DESYREL) 50 MG tablet Take 50 mg by mouth at bedtime.   . [DISCONTINUED] predniSONE (DELTASONE) 10 MG tablet Take 40mg  po daily for 2 days then 30mg  daily for 2 days then 20mg  daily for 2 days then 10mg  daily for 2 days then stop (Patient not taking: Reported on 08/03/2015) 08/03/2015: Pt finished prednisone   No facility-administered encounter medications on file as  of 08/24/2015.    Functional Status:   In your present state of health, do you have any difficulty performing the following activities: 08/03/2015 07/27/2015  Hearing? N -  Vision? N -  Difficulty concentrating or making decisions? N -  Walking or climbing stairs? N -  Dressing or bathing? N -  Doing errands, shopping? N -  Preparing Food and eating ? N N  Using the Toilet? N N  In the past six months, have you accidently leaked urine? N N  Do you have problems  with loss of bowel control? N N  Managing your Medications? N N  Managing your Finances? N N  Housekeeping or managing your Housekeeping? N N    Fall/Depression Screening:    PHQ 2/9 Scores 08/03/2015 07/27/2015  PHQ - 2 Score 1 1    Assessment:  Pt managing well at home, needs reinforcement with symptom management pneumonia and low sodium diet. No new issues reported.  THN CM Care Plan Problem One        Most Recent Value   Care Plan Problem One  Knowledge deficit related to pneumonia   Role Documenting the Problem One  Care Management Coordinator   Care Plan for Problem One  Active   THN Long Term Goal (31-90 days)  pt will have no readmissions related to pneumonia within 90 days   THN Long Term Goal Start Date  07/22/15   Interventions for Problem One Long Term Goal  RN CM reviewed medications and importance of taking as prescribed.   THN CM Short Term Goal #1 (0-30 days)  pt will verbalize signs/ symptoms pneumonia exaceration within 30 days   [facility]N CM Short Term Goal #1 Start Date  08/24/15   Interventions for Short Term Goal #1  RN CM reviewed action plan and importance of calling MD early for change in health status or symptoms reltated to pneumonia, reviewed symtoms such as fever, increased mucus, etc.    [facility]N CM Care Plan Problem Two        Most Recent Value   Care Plan Problem Two  Knowledge deficit related to low sodium diet   Role Documenting the Problem Two  Care Management Coordinator   Care Plan for Problem Two  Active   THN CM Short Term Goal #1 (0-30 days)  Pt will verbalize foods high in sodium   [facility]N CM Short Term Goal #1 Start Date  08/24/15 [goal restarted-needs reinforcement]   Interventions for Short Term Goal #2   RN CM reviewed food choices with high sodium content to limit/ avoid        Plan: follow up with home visit 09/20/15 Plan to discharge or transfer to RN health coach  [name] [name] [facility], [place] [phone]

## 2015-09-09 DIAGNOSIS — N189 Chronic kidney disease, unspecified: Secondary | ICD-10-CM | POA: Diagnosis not present

## 2015-09-09 DIAGNOSIS — I10 Essential (primary) hypertension: Secondary | ICD-10-CM | POA: Diagnosis not present

## 2015-09-09 DIAGNOSIS — E876 Hypokalemia: Secondary | ICD-10-CM | POA: Diagnosis not present

## 2015-09-09 DIAGNOSIS — R739 Hyperglycemia, unspecified: Secondary | ICD-10-CM | POA: Diagnosis not present

## 2015-09-09 DIAGNOSIS — K219 Gastro-esophageal reflux disease without esophagitis: Secondary | ICD-10-CM | POA: Diagnosis not present

## 2015-09-15 DIAGNOSIS — E876 Hypokalemia: Secondary | ICD-10-CM | POA: Diagnosis not present

## 2015-09-15 DIAGNOSIS — J189 Pneumonia, unspecified organism: Secondary | ICD-10-CM | POA: Diagnosis not present

## 2015-09-15 DIAGNOSIS — D649 Anemia, unspecified: Secondary | ICD-10-CM | POA: Diagnosis not present

## 2015-09-15 DIAGNOSIS — Z6841 Body Mass Index (BMI) 40.0 and over, adult: Secondary | ICD-10-CM | POA: Diagnosis not present

## 2015-09-20 ENCOUNTER — Encounter: Payer: Self-pay | Admitting: *Deleted

## 2015-09-20 ENCOUNTER — Other Ambulatory Visit: Payer: Self-pay | Admitting: *Deleted

## 2015-09-20 NOTE — Patient Outreach (Signed)
[facility]) Care Management   09/20/2015  [name] [name] Dec 07, 1940 [id number]  [name] [name] is an 75 y.o. female  Subjective: Routine home visit with pt, HIPAA verified, pt states she saw primary MD last week "and everything checked out good, no changes made"  Reports no medication changes.  Pt states " my breathing has been good and I'm using my breathing treatments"  Objective:   Filed Vitals:   09/20/15 1401  BP: 118/62  Pulse: 64  Resp: 18  SpO2: 98%   ROS  Physical Exam  Constitutional: She is oriented to person, place, and time. She appears well-developed and well-nourished.  HENT:  Head: Normocephalic.  Neck: Normal range of motion. Neck supple.  Cardiovascular: Normal rate and regular rhythm.   Respiratory: Effort normal and breath sounds normal.  Dyspnea with exertion, especially on hot days  GI: Soft. Bowel sounds are normal.  Musculoskeletal: Normal range of motion. She exhibits no edema.  Neurological: She is alert and oriented to person, place, and time.  Skin: Skin is warm and dry.  Psychiatric: She has a normal mood and affect. Her behavior is normal. Judgment and thought content normal.    Encounter Medications:   Outpatient Encounter Prescriptions as of 09/20/2015  Medication Sig Note  . acetaminophen (TYLENOL) 325 MG tablet Take 650 mg by mouth every 6 (six) hours as needed for mild pain.   Marland Kitchen albuterol (PROVENTIL) (2.5 MG/3ML) 0.083% nebulizer solution Take 3 mLs (2.5 mg total) by nebulization every 4 (four) hours as needed for wheezing. 07/22/2015: Pt has script, getting filled today  . Calcium Citrate-Vitamin D (CITRACAL PETITES/VITAMIN D) 200-250 MG-UNIT TABS Take 1 tablet by mouth daily.   Marland Kitchen dextromethorphan-guaiFENesin (MUCINEX DM) 30-600 MG 12hr tablet Take 1 tablet by mouth 2 (two) times daily as needed for cough.   . diazepam (VALIUM) 5 MG tablet Take 5 mg by mouth 3 (three) times daily as needed. For anxiety   . docusate sodium  (COLACE) 100 MG capsule Take 100 mg by mouth at bedtime.    . fluocinonide cream (LIDEX) 1.02 % Apply 1 application topically 2 (two) times daily.   Marland Kitchen guaifenesin (ROBITUSSIN) 100 MG/5ML syrup Take 200 mg by mouth 3 (three) times daily as needed for cough.   Marland Kitchen HYDROcodone-acetaminophen (NORCO) 10-325 MG per tablet Take 1 tablet by mouth every 6 (six) hours as needed for moderate pain.   . iron polysaccharides (NIFEREX) 150 MG capsule Take 1 capsule (150 mg total) by mouth 2 (two) times daily.   Marland Kitchen lisinopril-hydrochlorothiazide (PRINZIDE,ZESTORETIC) 20-12.5 MG tablet 1 tablet daily. Reported on 08/03/2015 07/22/2015: Reviewed with pt that DC summary states not to take  . nystatin-triamcinolone ointment (MYCOLOG) Apply 1 application topically 2 (two) times daily.   . pantoprazole (PROTONIX) 40 MG tablet 40 mg daily.  07/14/2015: Received from: External Pharmacy  . potassium chloride (K-DUR) 10 MEQ tablet Take 10 mEq by mouth 3 (three) times daily with meals.    . sertraline (ZOLOFT) 100 MG tablet Take 100 mg by mouth daily.   . traZODone (DESYREL) 50 MG tablet Take 50 mg by mouth at bedtime.    No facility-administered encounter medications on file as of 09/20/2015.    Functional Status:   In your present state of health, do you have any difficulty performing the following activities: 08/03/2015 07/27/2015  Hearing? N -  Vision? N -  Difficulty concentrating or making decisions? N -  Walking or climbing stairs? N -  Dressing or  bathing? N -  Doing errands, shopping? N -  Preparing Food and eating ? N N  Using the Toilet? N N  In the past six months, have you accidently leaked urine? N N  Do you have problems with loss of bowel control? N N  Managing your Medications? N N  Managing your Finances? N N  Housekeeping or managing your Housekeeping? N N    Fall/Depression Screening:    PHQ 2/9 Scores 08/03/2015 07/27/2015  PHQ - 2 Score 1 1    Assessment:  Pt managing well at home , ambulating  well, getting outside some.  RN CM talked with pt and ask her to avoid getting outside during hottest part of day.  RN CM offered RN health coach and pt declined.  Discharge today and RN CM faxed primary MD Dr. [name] case closure letter, mailed case closure letter to pt.  THN CM Care Plan Problem One        Most Recent Value   Care Plan Problem One  Knowledge deficit related to pneumonia   Role Documenting the Problem One  Care Management Coordinator   Care Plan for Problem One  Active   THN Long Term Goal (31-90 days)  pt will have no readmissions related to pneumonia within 90 days   THN Long Term Goal Start Date  07/22/15   [facility] Long Term Goal Met Date  09/20/15   Interventions for Problem One Long Term Goal  RN CM reviewed medications with pt   THN CM Short Term Goal #1 (0-30 days)  pt will verbalize signs/ symptoms pneumonia exaceration within 30 days   THN CM Short Term Goal #1 Start Date  08/24/15   [facility] CM Short Term Goal #1 Met Date  09/20/15   Interventions for Short Term Goal #1  RN CM reinforced action plan    [facility] CM Care Plan Problem Two        Most Recent Value   Care Plan Problem Two  Knowledge deficit related to low sodium diet   Role Documenting the Problem Two  Care Management Coordinator   Care Plan for Problem Two  Active   THN CM Short Term Goal #1 (0-30 days)  Pt will verbalize foods high in sodium   THN CM Short Term Goal #1 Start Date  08/24/15 [goal restarted-needs reinforcement]   [facility] CM Short Term Goal #1 Met Date   09/20/15   Interventions for Short Term Goal #2   RN CM reviewed low sodium diet      Plan: discharge today  Jacqlyn Larsen [facility], BSN [place] Coordinator [phone]

## 2015-09-23 ENCOUNTER — Encounter (HOSPITAL_COMMUNITY): Payer: Self-pay | Admitting: Emergency Medicine

## 2015-09-23 ENCOUNTER — Emergency Department (HOSPITAL_COMMUNITY): Payer: Medicare Other

## 2015-09-23 ENCOUNTER — Inpatient Hospital Stay (HOSPITAL_COMMUNITY)
Admission: EM | Admit: 2015-09-23 | Discharge: 2015-09-25 | DRG: 194 | Disposition: A | Discharge status: Home / Self Care | Payer: Medicare Other | Attending: Internal Medicine | Admitting: Internal Medicine

## 2015-09-23 DIAGNOSIS — Z800 Family history of malignant neoplasm of digestive organs: Secondary | ICD-10-CM | POA: Diagnosis not present

## 2015-09-23 DIAGNOSIS — K219 Gastro-esophageal reflux disease without esophagitis: Secondary | ICD-10-CM | POA: Diagnosis present

## 2015-09-23 DIAGNOSIS — I10 Essential (primary) hypertension: Secondary | ICD-10-CM | POA: Diagnosis present

## 2015-09-23 DIAGNOSIS — D638 Anemia in other chronic diseases classified elsewhere: Secondary | ICD-10-CM | POA: Diagnosis present

## 2015-09-23 DIAGNOSIS — M810 Age-related osteoporosis without current pathological fracture: Secondary | ICD-10-CM | POA: Diagnosis present

## 2015-09-23 DIAGNOSIS — E876 Hypokalemia: Secondary | ICD-10-CM | POA: Diagnosis not present

## 2015-09-23 DIAGNOSIS — Z6838 Body mass index (BMI) 38.0-38.9, adult: Secondary | ICD-10-CM

## 2015-09-23 DIAGNOSIS — Z6841 Body Mass Index (BMI) 40.0 and over, adult: Secondary | ICD-10-CM

## 2015-09-23 DIAGNOSIS — N179 Acute kidney failure, unspecified: Secondary | ICD-10-CM | POA: Diagnosis present

## 2015-09-23 DIAGNOSIS — Z96652 Presence of left artificial knee joint: Secondary | ICD-10-CM | POA: Diagnosis present

## 2015-09-23 DIAGNOSIS — Z96611 Presence of right artificial shoulder joint: Secondary | ICD-10-CM | POA: Diagnosis present

## 2015-09-23 DIAGNOSIS — J189 Pneumonia, unspecified organism: Principal | ICD-10-CM | POA: Diagnosis present

## 2015-09-23 DIAGNOSIS — R079 Chest pain, unspecified: Secondary | ICD-10-CM | POA: Diagnosis not present

## 2015-09-23 DIAGNOSIS — Z8701 Personal history of pneumonia (recurrent): Secondary | ICD-10-CM | POA: Diagnosis not present

## 2015-09-23 DIAGNOSIS — Z803 Family history of malignant neoplasm of breast: Secondary | ICD-10-CM

## 2015-09-23 DIAGNOSIS — Y95 Nosocomial condition: Secondary | ICD-10-CM | POA: Diagnosis present

## 2015-09-23 DIAGNOSIS — R05 Cough: Secondary | ICD-10-CM | POA: Diagnosis not present

## 2015-09-23 DIAGNOSIS — F329 Major depressive disorder, single episode, unspecified: Secondary | ICD-10-CM | POA: Diagnosis present

## 2015-09-23 DIAGNOSIS — F419 Anxiety disorder, unspecified: Secondary | ICD-10-CM | POA: Diagnosis present

## 2015-09-23 DIAGNOSIS — E860 Dehydration: Secondary | ICD-10-CM | POA: Diagnosis present

## 2015-09-23 DIAGNOSIS — R509 Fever, unspecified: Secondary | ICD-10-CM | POA: Diagnosis not present

## 2015-09-23 DIAGNOSIS — Z8249 Family history of ischemic heart disease and other diseases of the circulatory system: Secondary | ICD-10-CM

## 2015-09-23 LAB — COMPREHENSIVE METABOLIC PANEL
ALT: 29 U/L (ref 14–54)
AST: 82 U/L — ABNORMAL HIGH (ref 15–41)
Albumin: 3.5 g/dL (ref 3.5–5.0)
Alkaline Phosphatase: 122 U/L (ref 38–126)
Anion gap: 5 (ref 5–15)
BUN: 27 mg/dL — ABNORMAL HIGH (ref 6–20)
CO2: 23 mmol/L (ref 22–32)
Calcium: 7.9 mg/dL — ABNORMAL LOW (ref 8.9–10.3)
Chloride: 109 mmol/L (ref 101–111)
Creatinine, Ser: 1.25 mg/dL — ABNORMAL HIGH (ref 0.44–1.00)
GFR calc Af Amer: 48 mL/min — ABNORMAL LOW (ref >60)
GFR calc non Af Amer: 41 mL/min — ABNORMAL LOW (ref >60)
Glucose, Bld: 138 mg/dL — ABNORMAL HIGH (ref 65–99)
Potassium: 3.4 mmol/L — ABNORMAL LOW (ref 3.5–5.1)
Sodium: 137 mmol/L (ref 135–145)
Total Bilirubin: 0.7 mg/dL (ref 0.3–1.2)
Total Protein: 5.8 g/dL — ABNORMAL LOW (ref 6.5–8.1)

## 2015-09-23 LAB — CBC WITH DIFFERENTIAL/PLATELET
Basophils Absolute: 0 10*3/uL (ref 0.0–0.1)
Basophils Relative: 0 %
Eosinophils Absolute: 0.2 10*3/uL (ref 0.0–0.7)
Eosinophils Relative: 2 %
HCT: 35.3 % — ABNORMAL LOW (ref 36.0–46.0)
Hemoglobin: 11.2 g/dL — ABNORMAL LOW (ref 12.0–15.0)
Lymphocytes Relative: 6 %
Lymphs Abs: 0.6 10*3/uL — ABNORMAL LOW (ref 0.7–4.0)
MCH: 30.7 pg (ref 26.0–34.0)
MCHC: 31.7 g/dL (ref 30.0–36.0)
MCV: 96.7 fL (ref 78.0–100.0)
Monocytes Absolute: 0.8 10*3/uL (ref 0.1–1.0)
Monocytes Relative: 8 %
Neutro Abs: 7.7 10*3/uL (ref 1.7–7.7)
Neutrophils Relative %: 84 %
Platelets: 136 10*3/uL — ABNORMAL LOW (ref 150–400)
RBC: 3.65 MIL/uL — ABNORMAL LOW (ref 3.87–5.11)
RDW: 15 % (ref 11.5–15.5)
WBC: 9.2 10*3/uL (ref 4.0–10.5)

## 2015-09-23 LAB — TROPONIN I: Troponin I: <0.03 ng/mL (ref <0.03)

## 2015-09-23 MED ORDER — DEXTROSE 5 % IV SOLN
2.0000 g | Freq: Once | INTRAVENOUS | Status: AC
  Administered 2015-09-23: 2 g via INTRAVENOUS
  Filled 2015-09-23: qty 2

## 2015-09-23 MED ORDER — HEPARIN SODIUM (PORCINE) 5000 UNIT/ML IJ SOLN
5000.0000 [IU] | Freq: Three times a day (TID) | INTRAMUSCULAR | Status: DC
  Administered 2015-09-23 – 2015-09-24 (×3): 5000 [IU] via SUBCUTANEOUS
  Filled 2015-09-23 (×2): qty 1

## 2015-09-23 MED ORDER — HYDROCODONE-ACETAMINOPHEN 10-325 MG PO TABS
1.0000 | ORAL_TABLET | Freq: Four times a day (QID) | ORAL | Status: DC | PRN
  Administered 2015-09-23 – 2015-09-24 (×2): 1 via ORAL
  Filled 2015-09-23 (×2): qty 1

## 2015-09-23 MED ORDER — CALCIUM CARBONATE-VITAMIN D 500-200 MG-UNIT PO TABS
1.0000 | ORAL_TABLET | Freq: Every day | ORAL | Status: DC
  Administered 2015-09-24 – 2015-09-25 (×2): 1 via ORAL
  Filled 2015-09-23 (×2): qty 1

## 2015-09-23 MED ORDER — CALCIUM CITRATE-VITAMIN D 200-250 MG-UNIT PO TABS: 1.0000 | ORAL_TABLET | Freq: Every day | ORAL | Status: DC

## 2015-09-23 MED ORDER — ONDANSETRON HCL 4 MG PO TABS: 4.0000 mg | ORAL_TABLET | Freq: Four times a day (QID) | ORAL | Status: DC | PRN

## 2015-09-23 MED ORDER — ONDANSETRON HCL 4 MG/2ML IJ SOLN: 4.0000 mg | Freq: Four times a day (QID) | INTRAMUSCULAR | Status: DC | PRN

## 2015-09-23 MED ORDER — DOCUSATE SODIUM 100 MG PO CAPS
100.0000 mg | ORAL_CAPSULE | Freq: Every day | ORAL | Status: DC
  Administered 2015-09-23: 100 mg via ORAL
  Filled 2015-09-23 (×2): qty 1

## 2015-09-23 MED ORDER — TRAZODONE HCL 50 MG PO TABS
50.0000 mg | ORAL_TABLET | Freq: Every day | ORAL | Status: DC
  Administered 2015-09-23 – 2015-09-24 (×2): 50 mg via ORAL
  Filled 2015-09-23 (×2): qty 1

## 2015-09-23 MED ORDER — ALBUTEROL SULFATE (2.5 MG/3ML) 0.083% IN NEBU
2.5000 mg | INHALATION_SOLUTION | RESPIRATORY_TRACT | Status: DC | PRN
  Filled 2015-09-23: qty 3

## 2015-09-23 MED ORDER — VANCOMYCIN HCL IN DEXTROSE 1-5 GM/200ML-% IV SOLN
1000.0000 mg | INTRAVENOUS | Status: AC
  Administered 2015-09-23 (×2): 1000 mg via INTRAVENOUS
  Filled 2015-09-23 (×2): qty 200

## 2015-09-23 MED ORDER — POTASSIUM CHLORIDE ER 10 MEQ PO TBCR
10.0000 meq | EXTENDED_RELEASE_TABLET | Freq: Three times a day (TID) | ORAL | Status: DC
  Administered 2015-09-23 – 2015-09-24 (×2): 10 meq via ORAL
  Filled 2015-09-23 (×11): qty 1

## 2015-09-23 MED ORDER — ACETAMINOPHEN 325 MG PO TABS
650.0000 mg | ORAL_TABLET | Freq: Four times a day (QID) | ORAL | Status: DC | PRN
  Administered 2015-09-24: 650 mg via ORAL
  Filled 2015-09-23: qty 2

## 2015-09-23 MED ORDER — VANCOMYCIN HCL 10 G IV SOLR
1250.0000 mg | INTRAVENOUS | Status: AC
  Administered 2015-09-24: 1250 mg via INTRAVENOUS
  Filled 2015-09-23 (×2): qty 1250

## 2015-09-23 MED ORDER — POLYSACCHARIDE IRON COMPLEX 150 MG PO CAPS
150.0000 mg | ORAL_CAPSULE | Freq: Two times a day (BID) | ORAL | Status: DC
  Administered 2015-09-23 – 2015-09-25 (×4): 150 mg via ORAL
  Filled 2015-09-23 (×4): qty 1

## 2015-09-23 MED ORDER — PANTOPRAZOLE SODIUM 40 MG PO TBEC
40.0000 mg | DELAYED_RELEASE_TABLET | Freq: Every day | ORAL | Status: DC
  Administered 2015-09-24 – 2015-09-25 (×2): 40 mg via ORAL
  Filled 2015-09-23 (×2): qty 1

## 2015-09-23 MED ORDER — DIAZEPAM 5 MG PO TABS: 5.0000 mg | ORAL_TABLET | Freq: Two times a day (BID) | ORAL | Status: DC | PRN

## 2015-09-23 MED ORDER — ALBUTEROL SULFATE (2.5 MG/3ML) 0.083% IN NEBU
2.5000 mg | INHALATION_SOLUTION | Freq: Four times a day (QID) | RESPIRATORY_TRACT | Status: DC
  Administered 2015-09-23 – 2015-09-25 (×5): 2.5 mg via RESPIRATORY_TRACT
  Filled 2015-09-23 (×4): qty 3

## 2015-09-23 MED ORDER — IPRATROPIUM-ALBUTEROL 0.5-2.5 (3) MG/3ML IN SOLN
3.0000 mL | Freq: Once | RESPIRATORY_TRACT | Status: AC
Start: 2015-09-23 — End: 2015-09-23
  Administered 2015-09-23: 3 mL via RESPIRATORY_TRACT
  Filled 2015-09-23: qty 3

## 2015-09-23 MED ORDER — SERTRALINE HCL 50 MG PO TABS
100.0000 mg | ORAL_TABLET | Freq: Every day | ORAL | Status: DC
  Administered 2015-09-24 – 2015-09-25 (×2): 100 mg via ORAL
  Filled 2015-09-23 (×2): qty 2

## 2015-09-23 MED ORDER — DEXTROSE 5 % IV SOLN
2.0000 g | Freq: Two times a day (BID) | INTRAVENOUS | Status: AC
  Administered 2015-09-24 (×2): 2 g via INTRAVENOUS
  Filled 2015-09-23 (×5): qty 2

## 2015-09-23 NOTE — ED Notes (Signed)
Pt states she feels better after neb tx.  Denies any complaints.

## 2015-09-23 NOTE — ED Notes (Signed)
Pt provided drink/snack at this time. 

## 2015-09-23 NOTE — H&P (Signed)
History and Physical    SINCLAIR COMPTON [id number] DOB: [birth year] DOA: 09/23/2015  Referring MD/NP/PA: Nat Christen, MD PCP: [name], MD   Outpatient Specialists:   Gastroenterologist: Danie Binder, MD Patient coming from: Home  Chief Complaint: Chest Pain  HPI: [name] [name] is a 75 y.o. female with medical history significant of HTN, GERD, depression, anxiety, and Obesity, who was recently treated for PNA in May, and was placed on Zithromax and prednisone as an outpatient. She reports complying with her medications, but noticed no significant improvement. She presents tonight with complaints of left sided CP rated 5/10 that onset this morning after becoming overheated yesterday afternoon while working outside for 15 minutes. She is no longer taking her prednisone that was prescribed for her past PNA. She reports associated cough with production of red fluid which has since begun to improve. She also reports having a fever of 99.9 this morning.   ED Course: While in the ED, Troponin <0.03, BUN 27, Creatinine 1.25, Glucose 138, AST 82. CXR shows left basilar infiltrate. EKG sinus rhythm. Will admit for further management of CAP.  Review of Systems: As per HPI otherwise 10 point review of systems negative.   Past Medical History  Diagnosis Date  . PONV (postoperative nausea and vomiting)   . Hypertension   . Depression   . Seasonal allergies   . Pneumonia   . Anxiety   . GERD (gastroesophageal reflux disease)   . H/O hiatal hernia   . Arthritis   . Anemia   . Obesity   . Sepsis ([place]) 10/02/2013    Past Surgical History  Procedure Laterality Date  . Back surgery      lumbar x2 by Dr Arnoldo Morale  . Tonsillectomy    . Appendectomy    . Abdominal hysterectomy    . Cholecystectomy    . Joint replacement Right 1995    hip  . Joint replacement Left 2008    hip  . Medial partial knee replacement Left     Dr Ronnie Derby  . Shoulder arthroscopy Right   . Shoulder arthroscopy  Right     x2  . Total shoulder arthroplasty Right 02/16/2013    Procedure: TOTAL SHOULDER ARTHROPLASTY;  Surgeon: Marybelle Killings, MD;  Location: [place];  Service: Orthopedics;  Laterality: Right;  Right Total Shoulder Arthroplasty, Cemented  . Cervical fusion    . Cataract extraction w/phaco Left 08/06/2013    Procedure: CATARACT EXTRACTION PHACO AND INTRAOCULAR LENS PLACEMENT LEFT EYE;  Surgeon: Tonny Branch, MD;  Location: [facility];  Service: Ophthalmology;  Laterality: Left;  CDE: 9.55  . Cataract extraction w/phaco Right 08/24/2013    Procedure: CATARACT EXTRACTION PHACO AND INTRAOCULAR LENS PLACEMENT (IOC);  Surgeon: Tonny Branch, MD;  Location: [facility];  Service: Ophthalmology;  Laterality: Right;  CDE:8.30  . Eye surgery    . Paraesophageal hernia repair  SEP 2010 DR. MCNATT  . Colonoscopy N/A 11/03/2013    SLF: 1. Normal mucosa in the terminal ileum. 2. 13 colon polyps removed. 3. Moderate diverticulosis in the descending colon and sigmoid colon 4. the left colon is redundant 5. Small internal  hemorrhoids.  . Esophagogastroduodenoscopy N/A 11/03/2013    SLF: 1. 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 Mild Non-erosive gastritis  . Hiatal hernia repair      [facility] in 2011  . Carpal tunnel release Right 03/2014    Dr. Lorin Mercy  . Gastric bypass  1979    revision in 1980  . Skin biopsy       reports that she has never smoked. She has never used smokeless tobacco. She reports that she does not drink alcohol or use illicit drugs.  Allergies  Allergen Reactions  . Novocain [Procaine Hcl]     Unknown-passed out with novocaine injected for dental surgery.  . Other Hives    EKG pads - need to use pediatric pads  . Latex Rash  . Penicillins Rash  . Sulfa Antibiotics Rash    Family History  Problem Relation Age of Onset  . Colon cancer Brother     IN HIS 60s-METASTATIC  . Hypertension Brother   . Colon polyps Paternal Grandfather   . Breast  cancer Mother   . Hypertension Father     Prior to Admission medications   Medication Sig Start Date End Date Taking? Authorizing Provider  acetaminophen (TYLENOL) 325 MG tablet Take 650 mg by mouth every 6 (six) hours as needed for mild pain.    Historical Provider, MD  albuterol (PROVENTIL) (2.5 MG/3ML) 0.083% nebulizer solution Take 3 mLs (2.5 mg total) by nebulization every 4 (four) hours as needed for wheezing. 07/21/15   Kathie Dike, MD  Calcium Citrate-Vitamin D (CITRACAL PETITES/VITAMIN D) 200-250 MG-UNIT TABS Take 1 tablet by mouth daily.    Historical Provider, MD  dextromethorphan-guaiFENesin (MUCINEX DM) 30-600 MG 12hr tablet Take 1 tablet by mouth 2 (two) times daily as needed for cough.    Historical Provider, MD  diazepam (VALIUM) 5 MG tablet Take 5 mg by mouth 3 (three) times daily as needed. For anxiety    Historical Provider, MD  docusate sodium (COLACE) 100 MG capsule Take 100 mg by mouth at bedtime.     Historical Provider, MD  fluocinonide cream (LIDEX) [id number] % Apply 1 application topically 2 (two) times daily. 04/14/15   Florian Buff, MD  guaifenesin (ROBITUSSIN) 100 MG/5ML syrup Take 200 mg by mouth 3 (three) times daily as needed for cough.    Historical Provider, MD  HYDROcodone-acetaminophen (NORCO) 10-325 MG per tablet Take 1 tablet by mouth every 6 (six) hours as needed for moderate pain.    Historical Provider, MD  iron polysaccharides (NIFEREX) 150 MG capsule Take 1 capsule (150 mg total) by mouth 2 (two) times daily. 10/03/13   Kathie Dike, MD  lisinopril-hydrochlorothiazide (PRINZIDE,ZESTORETIC) 20-12.5 MG tablet 1 tablet daily. Reported on 08/03/2015 06/16/15   Historical Provider, MD  nystatin-triamcinolone ointment (MYCOLOG) Apply 1 application topically 2 (two) times daily. 03/31/15   Florian Buff, MD  pantoprazole (PROTONIX) 40 MG tablet 40 mg daily.  06/28/15   Historical Provider, MD  potassium chloride (K-DUR) 10 MEQ tablet Take 10 mEq by mouth 3 (three) times  daily with meals.     Historical Provider, MD  sertraline (ZOLOFT) 100 MG tablet Take 100 mg by mouth daily.    Historical Provider, MD  traZODone (DESYREL) 50 MG tablet Take 50 mg by mouth at bedtime.    Historical Provider, MD    Physical Exam: Filed Vitals:   09/23/15 1652 09/23/15 1730 09/23/15 1858 09/23/15 1900  BP: 125/70  107/48 107/48  Pulse: 82  75 76  Temp: 98.5 F (36.9 C)     TempSrc: Oral     Resp: 20  20 22   Height: 5\' 3"  (1.6 m)     Weight: 118.842 kg (262 lb)     SpO2: 94% 98% 92% 92%    Constitutional: NAD,  calm, comfortable Filed Vitals:   09/23/15 1652 09/23/15 1730 09/23/15 1858 09/23/15 1900  BP: 125/70  107/48 107/48  Pulse: 82  75 76  Temp: 98.5 F (36.9 C)     TempSrc: Oral     Resp: 20  20 22   Height: 5\' 3"  (1.6 m)     Weight: 118.842 kg (262 lb)     SpO2: 94% 98% 92% 92%   Eyes: PERRL, lids and conjunctivae normal ENMT: Mucous membranes are moist. Posterior pharynx clear of any exudate or lesions.Normal dentition.  Neck: normal, supple, no masses, no thyromegaly Respiratory: clear to auscultation bilaterally, no wheezing, no crackles. Normal respiratory effort. No accessory muscle use.  Cardiovascular: Regular rate and rhythm, no murmurs / rubs / gallops. No extremity edema. 2+ pedal pulses. No carotid bruits.  Abdomen: no tenderness, no masses palpated. No hepatosplenomegaly. Bowel sounds positive.  Musculoskeletal: no clubbing / cyanosis. No joint deformity upper and lower extremities. Good ROM, no contractures. Normal muscle tone.  Skin: no rashes, lesions, ulcers. No induration Neurologic: CN 2-12 grossly intact. Sensation intact, DTR normal. Strength 5/5 in all 4.  Psychiatric: Normal judgment and insight. Alert and oriented x 3. Normal mood.   Labs on Admission: I have personally reviewed following labs and imaging studies  CBC:  Recent Labs Lab 09/23/15 1659  WBC 9.2  NEUTROABS 7.7  HGB 11.2*  HCT 35.3*  MCV 96.7  PLT 136*    Basic Metabolic Panel:  Recent Labs Lab 09/23/15 1659  NA 137  K 3.4*  CL 109  CO2 23  GLUCOSE 138*  BUN 27*  CREATININE 1.25*  CALCIUM 7.9*   Liver Function Tests:  Recent Labs Lab 09/23/15 1659  AST 82*  ALT 29  ALKPHOS 122  BILITOT 0.7  PROT 5.8*  ALBUMIN 3.5   Cardiac Enzymes:  Recent Labs Lab 09/23/15 1659  TROPONINI <0.03   Urine analysis:    Component Value Date/Time   COLORURINE YELLOW 12/28/2013 1310   APPEARANCEUR CLEAR 12/28/2013 1310   LABSPEC 1.010 12/28/2013 1310   PHURINE 5.0 12/28/2013 1310   GLUCOSEU NEGATIVE 12/28/2013 1310   HGBUR NEGATIVE 12/28/2013 1310   BILIRUBINUR NEGATIVE 12/28/2013 1310   KETONESUR NEGATIVE 12/28/2013 1310   PROTEINUR NEGATIVE 12/28/2013 1310   UROBILINOGEN 0.2 12/28/2013 1310   NITRITE NEGATIVE 12/28/2013 1310   LEUKOCYTESUR NEGATIVE 12/28/2013 1310    Radiological Exams on Admission: Dg Chest 2 View  09/23/2015  CLINICAL DATA:  Left-sided chest pain.  Productive cough. EXAM: CHEST  2 VIEW COMPARISON:  Chest x-rays from Jul 12, 2015 and Jul 14, 2015. Chest CT Jul 19, 2015 FINDINGS: There is increased opacity in the left base. While there is a linear component suggesting some atelectasis, the findings are overall most suspicious for infiltrate. No other interval changes or acute abnormalities. IMPRESSION: Left basilar infiltrate.  Recommend follow-up to resolution. Electronically Signed   By: Dorise Bullion III M.D   On: 09/23/2015 18:47    EKG: Independently reviewed. SR  Assessment/Plan Principal Problem:   HCAP (healthcare-associated pneumonia) Active Problems:   Hypertension   Anemia of chronic disease   Morbid obesity ([place])   Hypokalemia  1. HCAP. Pt was recently treated in May for CAP and was treated with Zithromax and Rocephin. CXR shows left basilar infiltrate. Currently afebrile, though mildly tachypneic. WBC is within normal range. CXR showed persistent left lobe infiltrate, and may be residual  radiographic finding from perior PNA, however, she has low grade fever, and productive cough,  so we will go ahead wthe Hx for HCAP.  Continue Vanc and Cefepime and nebulizer treatments. No evidence of wheeze on exam, will discontinue steroids. Follow up CBC in the am.  2. AKI, likely prerenal. Cr mildly elevated, BUN 27. Give fluids. 3. Hypokalemia. Continue supplementation. 4. Anemia of chronic disease. No obvious signs of bleeding. Hgb appears stable.  5. HTN. Continue home medications 6. GERD. Continue PPI 7. Morbid obesity.   DVT prophylaxis: Heparin Code Status: Full Family Communication: No family at bedside  Disposition Plan: Discharge once improved Consults called: none Admission status: Admit to medical floor, Inpatient   Orvan Falconer, MD [facility]  If 7PM-7AM, please contact night-coverage [email or web address] Password [facility]  09/23/2015, 7:35 PM   By signing my name below, I, [name], attest that this documentation has been prepared under the direction and in the presence of Orvan Falconer, MD. Electronically Signed: [name], [place] 09/23/2015, 7:50 PM

## 2015-09-23 NOTE — ED Notes (Signed)
Pt ready for transport

## 2015-09-23 NOTE — Progress Notes (Signed)
Pharmacy Antibiotic Note  [name] [name] is a 75 y.o. female admitted on 09/23/2015 with pneumonia.  Pharmacy has been consulted for vancomycin and cefepime dosing.  Plan: Vancomycin 2 gm load then 1250 mg IV q24 hours Cefepime 2gm IV X 1 then 2gm IV q12 hours F/u renal function, cultures and clinical course  Height: 5\' 3"  (160 cm) Weight: 262 lb (118.842 kg) IBW/kg (Calculated) : 52.4  Temp (24hrs), Avg:98.5 F (36.9 C), Min:98.5 F (36.9 C), Max:98.5 F (36.9 C)   Recent Labs Lab 09/23/15 1659  WBC 9.2  CREATININE 1.25*    Estimated Creatinine Clearance: 49.2 mL/min (by C-G formula based on Cr of 1.25).    Allergies  Allergen Reactions  . Novocain [Procaine Hcl]     Unknown-passed out with novocaine injected for dental surgery.  . Other Hives    EKG pads - need to use pediatric pads  . Latex Rash  . Penicillins Rash  . Sulfa Antibiotics Rash    Antimicrobials this admission: vanc 7/21 >>  cefepime 7/21 >>    Thank you for allowing pharmacy to be a part of this patient's care.  Beverlee Nims 09/23/2015 7:42 PM

## 2015-09-23 NOTE — ED Notes (Signed)
Pt ambulated to restroom & returned to room w/ no complications. 

## 2015-09-23 NOTE — ED Notes (Signed)
Chest pain starting today am.  Pr reports becoming overheated while being outside yesterday afternoon.  NSR.  Reports coughing up some red fluid recently but lungs sounded clear.  Reports pain level 5/10.

## 2015-09-23 NOTE — ED Provider Notes (Signed)
CSN: [id number]     Arrival date & time 09/23/15  1648 History   First MD Initiated Contact with Patient 09/23/15 1650     Chief Complaint  Patient presents with  . Chest Pain     (Consider location/radiation/quality/duration/timing/severity/associated sxs/prior Treatment) HPI...Marland KitchenMarland KitchenPatient presents with fever, productive cough with bloody sputum since the middle of last night. She over exerted herself yesterday. She was admitted to the hospital on 07/14/2015 for pneumonia. She lives independently. Review of systems positive for weakness and malaise. No shaking chills, severe dyspnea, chest pain.  Past Medical History  Diagnosis Date  . PONV (postoperative nausea and vomiting)   . Hypertension   . Depression   . Seasonal allergies   . Pneumonia   . Anxiety   . GERD (gastroesophageal reflux disease)   . H/O hiatal hernia   . Arthritis   . Anemia   . Obesity   . Sepsis ([place]) 10/02/2013   Past Surgical History  Procedure Laterality Date  . Back surgery      lumbar x2 by Dr Arnoldo Morale  . Tonsillectomy    . Appendectomy    . Abdominal hysterectomy    . Cholecystectomy    . Joint replacement Right 1995    hip  . Joint replacement Left 2008    hip  . Medial partial knee replacement Left     Dr Ronnie Derby  . Shoulder arthroscopy Right   . Shoulder arthroscopy Right     x2  . Total shoulder arthroplasty Right 02/16/2013    Procedure: TOTAL SHOULDER ARTHROPLASTY;  Surgeon: Marybelle Killings, MD;  Location: [place];  Service: Orthopedics;  Laterality: Right;  Right Total Shoulder Arthroplasty, Cemented  . Cervical fusion    . Cataract extraction w/phaco Left 08/06/2013    Procedure: CATARACT EXTRACTION PHACO AND INTRAOCULAR LENS PLACEMENT LEFT EYE;  Surgeon: Tonny Branch, MD;  Location: [facility];  Service: Ophthalmology;  Laterality: Left;  CDE: 9.55  . Cataract extraction w/phaco Right 08/24/2013    Procedure: CATARACT EXTRACTION PHACO AND INTRAOCULAR LENS PLACEMENT (IOC);  Surgeon: Tonny Branch, MD;   Location: [facility];  Service: Ophthalmology;  Laterality: Right;  CDE:8.30  . Eye surgery    . Paraesophageal hernia repair  SEP 2010 DR. MCNATT  . Colonoscopy N/A 11/03/2013    SLF: 1. Normal mucosa in the terminal ileum. 2. 13 colon polyps removed. 3. Moderate diverticulosis in the descending colon and sigmoid colon 4. the left colon is redundant 5. Small internal  hemorrhoids.  . Esophagogastroduodenoscopy N/A 11/03/2013    SLF: 1. 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 Mild Non-erosive gastritis  . Hiatal hernia repair      [facility] in 2011  . Carpal tunnel release Right 03/2014    Dr. Lorin Mercy  . Gastric bypass  1979    revision in 1980  . Skin biopsy     Family History  Problem Relation Age of Onset  . Colon cancer Brother     IN HIS 60s-METASTATIC  . Hypertension Brother   . Colon polyps Paternal Grandfather   . Breast cancer Mother   . Hypertension Father    Social History  Substance Use Topics  . Smoking status: Never Smoker   . Smokeless tobacco: Never Used  . Alcohol Use: No   OB History    No data available     Review of Systems  All other systems reviewed and are negative.  Allergies  Novocain; Other; Latex; Penicillins; and Sulfa antibiotics  Home Medications   Prior to Admission medications   Medication Sig Start Date End Date Taking? Authorizing Provider  acetaminophen (TYLENOL) 325 MG tablet Take 650 mg by mouth every 6 (six) hours as needed for mild pain.    Historical Provider, MD  albuterol (PROVENTIL) (2.5 MG/3ML) 0.083% nebulizer solution Take 3 mLs (2.5 mg total) by nebulization every 4 (four) hours as needed for wheezing. 07/21/15   Kathie Dike, MD  Calcium Citrate-Vitamin D (CITRACAL PETITES/VITAMIN D) 200-250 MG-UNIT TABS Take 1 tablet by mouth daily.    Historical Provider, MD  dextromethorphan-guaiFENesin (MUCINEX DM) 30-600 MG 12hr tablet Take 1 tablet by mouth 2 (two) times daily as  needed for cough.    Historical Provider, MD  diazepam (VALIUM) 5 MG tablet Take 5 mg by mouth 3 (three) times daily as needed. For anxiety    Historical Provider, MD  docusate sodium (COLACE) 100 MG capsule Take 100 mg by mouth at bedtime.     Historical Provider, MD  fluocinonide cream (LIDEX) [id number] % Apply 1 application topically 2 (two) times daily. 04/14/15   Florian Buff, MD  guaifenesin (ROBITUSSIN) 100 MG/5ML syrup Take 200 mg by mouth 3 (three) times daily as needed for cough.    Historical Provider, MD  HYDROcodone-acetaminophen (NORCO) 10-325 MG per tablet Take 1 tablet by mouth every 6 (six) hours as needed for moderate pain.    Historical Provider, MD  iron polysaccharides (NIFEREX) 150 MG capsule Take 1 capsule (150 mg total) by mouth 2 (two) times daily. 10/03/13   Kathie Dike, MD  lisinopril-hydrochlorothiazide (PRINZIDE,ZESTORETIC) 20-12.5 MG tablet 1 tablet daily. Reported on 08/03/2015 06/16/15   Historical Provider, MD  nystatin-triamcinolone ointment (MYCOLOG) Apply 1 application topically 2 (two) times daily. 03/31/15   Florian Buff, MD  pantoprazole (PROTONIX) 40 MG tablet 40 mg daily.  06/28/15   Historical Provider, MD  potassium chloride (K-DUR) 10 MEQ tablet Take 10 mEq by mouth 3 (three) times daily with meals.     Historical Provider, MD  sertraline (ZOLOFT) 100 MG tablet Take 100 mg by mouth daily.    Historical Provider, MD  traZODone (DESYREL) 50 MG tablet Take 50 mg by mouth at bedtime.    Historical Provider, MD   BP 107/48 mmHg  Pulse 76  Temp(Src) 98.5 F (36.9 C) (Oral)  Resp 22  Ht 5\' 3"  (1.6 m)  Wt 262 lb (118.842 kg)  BMI 46.42 kg/m2  SpO2 92% Physical Exam  Constitutional: She is oriented to person, place, and time. She appears well-developed and well-nourished.  HENT:  Head: Normocephalic and atraumatic.  Eyes: Conjunctivae are normal.  Neck: Neck supple.  Cardiovascular: Normal rate and regular rhythm.   Pulmonary/Chest: Effort normal.  ?  Decreased BS left base  Abdominal: Soft. Bowel sounds are normal.  Musculoskeletal: Normal range of motion.  Neurological: She is alert and oriented to person, place, and time.  Skin: Skin is warm and dry.  Psychiatric: She has a normal mood and affect. Her behavior is normal.  Nursing note and vitals reviewed.   ED Course  Procedures (including critical care time) Labs Review Labs Reviewed  COMPREHENSIVE METABOLIC PANEL - Abnormal; Notable for the following:    Potassium 3.4 (*)    Glucose, Bld 138 (*)    BUN 27 (*)    Creatinine, Ser 1.25 (*)    Calcium 7.9 (*)    Total Protein 5.8 (*)  AST 82 (*)    GFR calc non Af Amer 41 (*)    GFR calc Af Amer 48 (*)    All other components within normal limits  CBC WITH DIFFERENTIAL/PLATELET - Abnormal; Notable for the following:    RBC 3.65 (*)    Hemoglobin 11.2 (*)    HCT 35.3 (*)    Platelets 136 (*)    Lymphs Abs 0.6 (*)    All other components within normal limits  TROPONIN I    Imaging Review Dg Chest 2 View  09/23/2015  CLINICAL DATA:  Left-sided chest pain.  Productive cough. EXAM: CHEST  2 VIEW COMPARISON:  Chest x-rays from Jul 12, 2015 and Jul 14, 2015. Chest CT Jul 19, 2015 FINDINGS: There is increased opacity in the left base. While there is a linear component suggesting some atelectasis, the findings are overall most suspicious for infiltrate. No other interval changes or acute abnormalities. IMPRESSION: Left basilar infiltrate.  Recommend follow-up to resolution. Electronically Signed   By: Dorise Bullion III M.D   On: 09/23/2015 18:47   I have personally reviewed and evaluated these images and lab results as part of my medical decision-making.   EKG Interpretation   Date/Time:  Friday September 23 2015 16:49:17 EDT Ventricular Rate:  76 PR Interval:    QRS Duration: 96 QT Interval:  368 QTC Calculation: 414 R Axis:     Text Interpretation:  Sinus rhythm Abnormal R-wave progression, early  transition Confirmed by  Ricci Paff  MD, Mariona Scholes ([id number]) on 09/23/2015 5:38:56 PM      MDM   Final diagnoses:  Healthcare-associated pneumonia    Patient is hemodynamically stable. Chest x-ray shows a left basilar infiltrate. Patient was admitted in May 2017 with pneumonia. Therefore, she will be considered a healthcare associated pneumonia. Antibiotics for same. Admit to general medicine.    Nat Christen, MD 09/23/15 Karl B[place]

## 2015-09-24 ENCOUNTER — Encounter (HOSPITAL_COMMUNITY): Payer: Self-pay | Admitting: *Deleted

## 2015-09-24 LAB — BASIC METABOLIC PANEL
Anion gap: 5 (ref 5–15)
BUN: 20 mg/dL (ref 6–20)
CO2: 27 mmol/L (ref 22–32)
Calcium: 8.1 mg/dL — ABNORMAL LOW (ref 8.9–10.3)
Chloride: 108 mmol/L (ref 101–111)
Creatinine, Ser: 1.09 mg/dL — ABNORMAL HIGH (ref 0.44–1.00)
GFR calc Af Amer: 57 mL/min — ABNORMAL LOW (ref >60)
GFR calc non Af Amer: 49 mL/min — ABNORMAL LOW (ref >60)
Glucose, Bld: 112 mg/dL — ABNORMAL HIGH (ref 65–99)
Potassium: 4.1 mmol/L (ref 3.5–5.1)
Sodium: 140 mmol/L (ref 135–145)

## 2015-09-24 LAB — CBC
HCT: 33.7 % — ABNORMAL LOW (ref 36.0–46.0)
Hemoglobin: 10.8 g/dL — ABNORMAL LOW (ref 12.0–15.0)
MCH: 31.3 pg (ref 26.0–34.0)
MCHC: 32 g/dL (ref 30.0–36.0)
MCV: 97.7 fL (ref 78.0–100.0)
Platelets: 129 10*3/uL — ABNORMAL LOW (ref 150–400)
RBC: 3.45 MIL/uL — ABNORMAL LOW (ref 3.87–5.11)
RDW: 15.2 % (ref 11.5–15.5)
WBC: 5.9 10*3/uL (ref 4.0–10.5)

## 2015-09-24 MED ORDER — LEVOFLOXACIN 750 MG PO TABS
750.0000 mg | ORAL_TABLET | Freq: Every day | ORAL | Status: DC
  Administered 2015-09-25: 750 mg via ORAL
  Filled 2015-09-24: qty 1

## 2015-09-24 MED ORDER — POTASSIUM CHLORIDE CRYS ER 20 MEQ PO TBCR: 40.0000 meq | EXTENDED_RELEASE_TABLET | Freq: Once | ORAL | Status: DC

## 2015-09-24 MED ORDER — POTASSIUM CHLORIDE CRYS ER 10 MEQ PO TBCR
10.0000 meq | EXTENDED_RELEASE_TABLET | Freq: Three times a day (TID) | ORAL | Status: DC
  Administered 2015-09-24 – 2015-09-25 (×3): 10 meq via ORAL
  Filled 2015-09-24 (×3): qty 1

## 2015-09-24 MED ORDER — DEXTROSE 5 % IV SOLN
INTRAVENOUS | Status: AC
  Filled 2015-09-24: qty 2

## 2015-09-24 NOTE — Evaluation (Signed)
Physical Therapy Evaluation Patient Details Name: [name] [name] MRN: [id number] DOB: [birth year] Today's Date: 09/24/2015   History of Present Illness  [name] [name] is a 75 y.o. female with medical history significant of HTN, GERD, depression, anxiety, and Obesity, who was recently treated for PNA in May, and was placed on Zithromax and prednisone as an outpatient. She reports complying with her medications, but noticed no significant improvement. She presents tonight with complaints of left sided CP rated 5/10 that onset this morning after becoming overheated yesterday afternoon while working outside for 15 minutes. She is no longer taking her prednisone that was prescribed for her past PNA. She reports associated cough with production of red fluid which has since begun to improve. She also reports having a fever of 99.9 this morning  Clinical Impression  Pt is at prior level of function.  She does not need any skilled PT     Follow Up Recommendations No PT follow up    Equipment Recommendations    none   Recommendations for Other Services   none    Precautions / Restrictions Precautions Precautions: None Restrictions Weight Bearing Restrictions: No      Mobility  Bed Mobility Overal bed mobility: Modified Independent                Transfers Overall transfer level: Modified independent                  Ambulation/Gait Ambulation/Gait assistance: Modified independent (Device/Increase time) Ambulation Distance (Feet): 200 Feet Assistive device:  (used IV pole for a cane) Gait Pattern/deviations: WFL(Within Functional Limits)   Gait velocity interpretation: at or above normal speed for age/gender    Stairs            Wheelchair Mobility    Modified Rankin (Stroke Patients Only)       Balance                                             Pertinent Vitals/Pain Pain Assessment: No/denies pain    Home Living Family/patient  expects to be discharged to:: Private residence Living Arrangements: Alone   Type of Home: House Home Access: Stairs to enter   Technical brewer of Steps: 3 Home Layout: One level Home Equipment: Cane - single point;Walker - standard;Tub bench      Prior Function Level of Independence: Independent with assistive device(s)               Hand Dominance        Extremity/Trunk Assessment               Lower Extremity Assessment: Overall WFL for tasks assessed         Communication   Communication: No difficulties  Cognition Arousal/Alertness: Awake/alert Behavior During Therapy: WFL for tasks assessed/performed Overall Cognitive Status: Within Functional Limits for tasks assessed                      General Comments      Exercises        Assessment/Plan    PT Assessment Patient needs continued PT services  PT Diagnosis     PT Problem List    PT Treatment Interventions     PT Goals (Current goals can be found in the Care Plan section)  End of Session Equipment Utilized During Treatment: Gait belt Activity Tolerance: Patient tolerated treatment well Patient left: in chair;with call bell/phone within reach      Functional Limitation: Mobility: Walking and moving around Mobility: Walking and Moving Around Current Status [phone]): At least 1 percent but less than 20 percent impaired, limited or restricted Mobility: Walking and Moving Around Goal Status [phone]): At least 1 percent but less than 20 percent impaired, limited or restricted Mobility: Walking and Moving Around Discharge Status [phone]): At least 1 percent but less than 20 percent impaired, limited or restricted    Time: 1330-1411 PT Time Calculation (min) (ACUTE ONLY): 41 min   Charges:   PT Evaluation $PT Eval Low Complexity: 1 Procedure     PT G Codes:   PT G-Codes **NOT FOR INPATIENT CLASS** Functional Limitation: Mobility: Walking and moving  around Mobility: Walking and Moving Around Current Status [id number]): At least 1 percent but less than 20 percent impaired, limited or restricted Mobility: Walking and Moving Around Goal Status [phone]): At least 1 percent but less than 20 percent impaired, limited or restricted Mobility: Walking and Moving Around Discharge Status [phone]): At least 1 percent but less than 20 percent impaired, limited or restricted      09/24/2015, 2:12 PM

## 2015-09-24 NOTE — Progress Notes (Signed)
PROGRESS NOTE                                                                                                                                                                                                             Patient Demographics:    [name] [name], is a 75 y.o. female, DOB - 09-27-1940, [id number]  Admit date - 09/23/2015   Admitting Physician Orvan Falconer, MD  Outpatient Primary MD for the patient is [name], MD  LOS - 1  Chief Complaint  Patient presents with  . Chest Pain       Brief Narrative    [name] [name] is a 75 y.o. female with medical history significant of HTN, GERD, depression, anxiety, and Obesity, who was recently treated for PNA in May, and was placed on Zithromax and prednisone as an outpatient. She reports complying with her medications, but noticed no significant improvement. She presents tonight with complaints of left sided CP rated 5/10 that onset this morning after becoming overheated yesterday afternoon while working outside for 15 minutes. She is no longer taking her prednisone that was prescribed for her past PNA. She reports associated cough with production of red fluid which has since begun to improve. She also reports having a fever of 99.9 this morning.   ED Course: While in the ED, Troponin <0.03, BUN 27, Creatinine 1.25, Glucose 138, AST 82. CXR shows left basilar infiltrate. EKG sinus rhythm. Will admit for further management of CAP.   Subjective:    Leiann Autin today has, No headache, No chest pain, No abdominal pain - No Nausea, No new weakness tingling or numbness, improved Cough & SOB.     Assessment  & Plan :     1.Pneumonia - Already better on IV vancomycin and cefepime, will continue on 09/24/2015, switched to oral Levaquin on 09/25/2015. Repeat chest x-ray in one week, continue supportive care for now, increase activity. If better likely discharge in the  morning.  2. Anemia of chronic disease. Stable outpatient follow-up with PCP with age appropriate outpatient workup.  3. ARF due to dehydration. Resolved after IV fluids.  4. GERD. On PPI.  5. Anxiety and depression. Continue home medications.  6. Osteoporosis. Continue home supplementation of calcium and vitamin D.  7. Atypical chest pain during admission. Due to pneumonia. EKG nonacute, troponin negative, completely  pain-free.   Family Communication  :  None present  Code Status :  Full  Diet : Heart Healthy  Disposition Plan  :  Likely home in 1-2 days  Consults  :     Procedures  :     DVT Prophylaxis  :    Heparin    Lab Results  Component Value Date   PLT 129* 09/24/2015    Inpatient Medications  Scheduled Meds: . albuterol  2.5 mg Nebulization Q6H  . calcium-vitamin D  1 tablet Oral Q breakfast  . ceFEPime (MAXIPIME) IV  2 g Intravenous Q12H  . docusate sodium  100 mg Oral QHS  . heparin  5,000 Units Subcutaneous Q8H  . iron polysaccharides  150 mg Oral BID  . pantoprazole  40 mg Oral Daily  . potassium chloride  10 mEq Oral TID WC  . potassium chloride  40 mEq Oral Once  . sertraline  100 mg Oral Daily  . traZODone  50 mg Oral QHS  . vancomycin  1,250 mg Intravenous Q24H   Continuous Infusions:  PRN Meds:.acetaminophen, albuterol, diazepam, HYDROcodone-acetaminophen, [DISCONTINUED] ondansetron **OR** ondansetron (ZOFRAN) IV  Antibiotics  :    Anti-infectives    Start     Dose/Rate Route Frequency Ordered Stop   09/24/15 2000  vancomycin (VANCOCIN) 1,250 mg in sodium chloride 0.9 % 250 mL IVPB     1,250 mg 166.7 mL/hr over 90 Minutes Intravenous Every 24 hours 09/23/15 1947     09/24/15 0700  ceFEPIme (MAXIPIME) 2 g in dextrose 5 % 50 mL IVPB     2 g 100 mL/hr over 30 Minutes Intravenous Every 12 hours 09/23/15 1947     09/23/15 1945  vancomycin (VANCOCIN) IVPB 1000 mg/200 mL premix     1,000 mg 200 mL/hr over 60 Minutes Intravenous Every 1 hr x  2 09/23/15 1940 09/23/15 2135   09/23/15 1945  ceFEPIme (MAXIPIME) 2 g in dextrose 5 % 50 mL IVPB     2 g 100 mL/hr over 30 Minutes Intravenous  Once 09/23/15 1941 09/23/15 2031         Objective:   Filed Vitals:   09/23/15 2214 09/23/15 2311 09/24/15 0639 09/24/15 0748  BP: 114/41  113/40   Pulse: 56 57 54   Temp: 98.7 F (37.1 C)  97.4 F (36.3 C)   TempSrc: Oral  Oral   Resp: 21 20 15    Height:      Weight: 104.962 kg (231 lb 6.4 oz)     SpO2: 95% 94% 98% 97%    Wt Readings from Last 3 Encounters:  09/23/15 104.962 kg (231 lb 6.4 oz)  08/03/15 104.781 kg (231 lb)  07/14/15 104.781 kg (231 lb)    No intake or output data in the 24 hours ending 09/24/15 0955   Physical Exam  Awake Alert, Oriented X 3, No new F.N deficits, Normal affect [place].AT,PERRAL Supple Neck,No JVD, No cervical lymphadenopathy appriciated.  Symmetrical Chest wall movement, Good air movement bilaterally, CTAB RRR,No Gallops,Rubs or new Murmurs, No Parasternal Heave +ve B.Sounds, Abd Soft, No tenderness, No organomegaly appriciated, No rebound - guarding or rigidity. No Cyanosis, Clubbing or edema, No new Rash or bruise       Data Review:    CBC  Recent Labs Lab 09/23/15 1659 09/24/15 0627  WBC 9.2 5.9  HGB 11.2* 10.8*  HCT 35.3* 33.7*  PLT 136* 129*  MCV 96.7 97.7  MCH 30.7 31.3  MCHC 31.7 32.0  RDW  15.0 15.2  LYMPHSABS 0.6*  --   MONOABS 0.8  --   EOSABS 0.2  --   BASOSABS 0.0  --     Chemistries   Recent Labs Lab 09/23/15 1659 09/24/15 0627  NA 137 140  K 3.4* 4.1  CL 109 108  CO2 23 27  GLUCOSE 138* 112*  BUN 27* 20  CREATININE 1.25* 1.09*  CALCIUM 7.9* 8.1*  AST 82*  --   ALT 29  --   ALKPHOS 122  --   BILITOT 0.7  --    ------------------------------------------------------------------------------------------------------------------ No results for input(s): CHOL, HDL, LDLCALC, TRIG, CHOLHDL, LDLDIRECT in the last 72 hours.  No results found for:  HGBA1C ------------------------------------------------------------------------------------------------------------------ No results for input(s): TSH, T4TOTAL, T3FREE, THYROIDAB in the last 72 hours.  Invalid input(s): FREET3 ------------------------------------------------------------------------------------------------------------------ No results for input(s): VITAMINB12, FOLATE, FERRITIN, TIBC, IRON, RETICCTPCT in the last 72 hours.  Coagulation profile No results for input(s): INR, PROTIME in the last 168 hours.  No results for input(s): DDIMER in the last 72 hours.  Cardiac Enzymes  Recent Labs Lab 09/23/15 1659  TROPONINI <0.03   ------------------------------------------------------------------------------------------------------------------ No results found for: BNP  Micro Results No results found for this or any previous visit (from the past 240 hour(s)).  Radiology Reports Dg Chest 2 View  09/23/2015  CLINICAL DATA:  Left-sided chest pain.  Productive cough. EXAM: CHEST  2 VIEW COMPARISON:  Chest x-rays from Jul 12, 2015 and Jul 14, 2015. Chest CT Jul 19, 2015 FINDINGS: There is increased opacity in the left base. While there is a linear component suggesting some atelectasis, the findings are overall most suspicious for infiltrate. No other interval changes or acute abnormalities. IMPRESSION: Left basilar infiltrate.  Recommend follow-up to resolution. Electronically Signed   By: Dorise Bullion III M.D   On: 09/23/2015 18:47    Time Spent in minutes  30   Lala Lund K M.D on 09/24/2015 at 9:55 AM  Between 7am to 7pm - Pager - [phone]  After 7pm go to [email or web address] - password [email or web address]  [facility] -  Office  [phone]

## 2015-09-25 MED ORDER — LEVOFLOXACIN 750 MG PO TABS: 750.0000 mg | ORAL_TABLET | Freq: Every day | ORAL | 0 refills | Status: DC

## 2015-09-25 NOTE — Progress Notes (Signed)
Pt's IV catheter removed and intact. Pt's IV site clean dry and intact. Discharge instructions including medications and follow up appointments were reviewed and discussed with patient. Pt verbalized understanding of discharge instructions including medications and follow up appointments. All questions were answered and no further questions at this time. Pt in stable condition and in no acute distress at time of discharge. Pt will be escorted by nurse tech.  

## 2015-09-25 NOTE — Discharge Summary (Signed)
[name] [name] [id number] DOB: [birth year] DOA: 09/23/2015  PCP: [name], MD  Admit date: 09/23/2015  Discharge date: 09/25/2015  Admitted From: Home   Disposition:  Home   Recommendations for Outpatient Follow-up:   Follow up with PCP in 1-2 weeks  PCP Please obtain BMP/CBC, 2 view CXR in 1week,  (see Discharge instructions)   PCP Please follow up on the following pending results: None   Home Health: None   Equipment/Devices: None  Consultations: None Discharge Condition: Stable   CODE STATUS: Full   Diet Recommendation: Heart healthy   Chief Complaint  Patient presents with  . Chest Pain     Brief history of present illness from the day of admission and additional interim summary    depression, anxiety, and Obesity, who was recently treated for PNA in May, and was placed on Zithromax and prednisone as an outpatient. She reports complying with her medications, but noticed no significant improvement. She presents tonight with complaints of left sided CP rated 5/10 that onset this morning after becoming overheated yesterday afternoon while working outside for 15 minutes. She is no longer taking her prednisone that was prescribed for her past PNA. She reports associated cough with production of red fluid which has since begun to improve. She also reports having a fever of 99.9 this morning.   ED Course: While in the ED, Troponin <0.03, BUN 27, Creatinine 1.25, Glucose 138, AST 82. CXR shows left basilar infiltrate. EKG sinus rhythm. Will admit for further management of CAP.    Hospital issues addressed    1.Pneumonia - Much improved after 24 hours of IV antibiotics, she is completely symptom free, no shortness of breath, without any problems, seen and cleared for home DC by PT, no 02 demand, will  place on 4 more days of oral Levaquin and discharged home with PCP follow-up, request PCP to check CBC and a two-view chest x-ray along with BMP next visit.  2. Anemia of chronic disease. Stable outpatient follow-up with PCP with age appropriate outpatient workup.  3. ARF due to dehydration. Resolved after IV fluids.  4. GERD. On PPI.  5. Anxiety and depression. Continue home medications.  6. Osteoporosis. Continue home supplementation of calcium and vitamin D.  7. Atypical chest pain during admission. Due to pneumonia. EKG was nonacute, troponin negative, completely pain-free.    Discharge diagnosis     Principal Problem:   HCAP (healthcare-associated pneumonia) Active Problems:   Hypertension   Anemia of chronic disease   Morbid obesity ([place])   Hypokalemia    Discharge instructions    Discharge Instructions    Diet - low sodium heart healthy    Complete by:  As directed   Discharge instructions    Complete by:  As directed   Follow with Primary MD [name], MD in 3-4 days   Get CBC, CMP, 2 view Chest X ray checked  by Primary MD or SNF MD in 5-7 days ( we routinely change or add medications that can affect your  baseline labs and fluid status, therefore we recommend that you get the mentioned basic workup next visit with your PCP, your PCP may decide not to get them or add new tests based on their clinical decision)   Activity: As tolerated with Full fall precautions use walker/cane & assistance as needed   Disposition Home     Diet:   Heart Healthy    For Heart failure patients - Check your Weight same time everyday, if you gain over 2 pounds, or you develop in leg swelling, experience more shortness of breath or chest pain, call your Primary MD immediately. Follow Cardiac Low Salt Diet and 1.5 lit/day fluid restriction.   On your next visit with your primary care physician please Get Medicines reviewed and adjusted.   Please request your Prim.MD to  go over all Hospital Tests and Procedure/Radiological results at the follow up, please get all Hospital records sent to your Prim MD by signing hospital release before you go home.   If you experience worsening of your admission symptoms, develop shortness of breath, life threatening emergency, suicidal or homicidal thoughts you must seek medical attention immediately by calling 911 or calling your MD immediately  if symptoms less severe.  You Must read complete instructions/literature along with all the possible adverse reactions/side effects for all the Medicines you take and that have been prescribed to you. Take any new Medicines after you have completely understood and accpet all the possible adverse reactions/side effects.   Do not drive, operate heavy machinery, perform activities at heights, swimming or participation in water activities or provide baby sitting services if your were admitted for syncope or siezures until you have seen by Primary MD or a Neurologist and advised to do so again.  Do not drive when taking Pain medications.    Do not take more than prescribed Pain, Sleep and Anxiety Medications  Special Instructions: If you have smoked or chewed Tobacco  in the last 2 yrs please stop smoking, stop any regular Alcohol  and or any Recreational drug use.  Wear Seat belts while driving.   Please note  You were cared for by a hospitalist during your hospital stay. If you have any questions about your discharge medications or the care you received while you were in the hospital after you are discharged, you can call the unit and asked to speak with the hospitalist on call if the hospitalist that took care of you is not available. Once you are discharged, your primary care physician will handle any further medical issues. Please note that NO REFILLS for any discharge medications will be authorized once you are discharged, as it is imperative that you return to your primary care  physician (or establish a relationship with a primary care physician if you do not have one) for your aftercare needs so that they can reassess your need for medications and monitor your lab values.   Increase activity slowly    Complete by:  As directed      Discharge Medications     Medication List    TAKE these medications   acetaminophen 325 MG tablet Commonly known as:  TYLENOL Take 650 mg by mouth every 6 (six) hours as needed for mild pain.   albuterol (2.5 MG/3ML) 0.083% nebulizer solution Commonly known as:  PROVENTIL Take 3 mLs (2.5 mg total) by nebulization every 4 (four) hours as needed for wheezing.   CITRACAL PETITES/VITAMIN D 200-250 MG-UNIT Tabs Generic drug:  Calcium Citrate-Vitamin D Take  1 tablet by mouth daily.   dextromethorphan-guaiFENesin 30-600 MG 12hr tablet Commonly known as:  MUCINEX DM Take 1 tablet by mouth 2 (two) times daily.   diazepam 5 MG tablet Commonly known as:  VALIUM Take 5 mg by mouth 3 (three) times daily as needed. For anxiety   docusate sodium 100 MG capsule Commonly known as:  COLACE Take 100 mg by mouth at bedtime.   fluocinonide cream 0.05 % Commonly known as:  LIDEX Apply 1 application topically 2 (two) times daily.   guaifenesin 100 MG/5ML syrup Commonly known as:  ROBITUSSIN Take 200 mg by mouth 3 (three) times daily as needed for cough.   HYDROcodone-acetaminophen 10-325 MG tablet Commonly known as:  NORCO Take 1 tablet by mouth every 6 (six) hours as needed for moderate pain.   iron polysaccharides 150 MG capsule Commonly known as:  NIFEREX Take 1 capsule (150 mg total) by mouth 2 (two) times daily.   levofloxacin 750 MG tablet Commonly known as:  LEVAQUIN Take 1 tablet (750 mg total) by mouth daily.   lisinopril-hydrochlorothiazide 20-12.5 MG tablet Commonly known as:  PRINZIDE,ZESTORETIC 1 tablet daily. Reported on 08/03/2015   nystatin-triamcinolone ointment Commonly known as:  MYCOLOG Apply 1  application topically 2 (two) times daily.   pantoprazole 40 MG tablet Commonly known as:  PROTONIX Take 40 mg by mouth daily.   potassium chloride 10 MEQ tablet Commonly known as:  K-DUR Take 10 mEq by mouth 3 (three) times daily with meals.   sertraline 100 MG tablet Commonly known as:  ZOLOFT Take 100 mg by mouth daily.   traZODone 50 MG tablet Commonly known as:  DESYREL Take 50 mg by mouth at bedtime.   vitamin B-12 100 MCG tablet Commonly known as:  CYANOCOBALAMIN Take 100 mcg by mouth daily.       Allergies  Allergen Reactions  . Novocain [Procaine Hcl]     Unknown-passed out with novocaine injected for dental surgery.  . Other Hives    EKG pads - need to use pediatric pads  . Latex Rash  . Penicillins Rash    Has patient had a PCN reaction causing immediate rash, facial/tongue/throat swelling, SOB or lightheadedness with hypotension: Yes Has patient had a PCN reaction causing severe rash involving mucus membranes or skin necrosis: No Has patient had a PCN reaction that required hospitalization No Has patient had a PCN reaction occurring within the last 10 years: No  If all of the above answers are "NO", then may proceed with Cephalosporin use.   Ignacia Bayley Antibiotics Rash    Follow-up Information    [name], MD. Schedule an appointment as soon as possible for a visit in 3 day(s).   Specialty:  Family Medicine Contact information: [place] [id number] [phone]           Major procedures and Radiology Reports - PLEASE review detailed and final reports thoroughly  -        Dg Chest 2 View  Result Date: 09/23/2015 CLINICAL DATA:  Left-sided chest pain.  Productive cough. EXAM: CHEST  2 VIEW COMPARISON:  Chest x-rays from Jul 12, 2015 and Jul 14, 2015. Chest CT Jul 19, 2015 FINDINGS: There is increased opacity in the left base. While there is a linear component suggesting some atelectasis, the findings are overall most suspicious for  infiltrate. No other interval changes or acute abnormalities. IMPRESSION: Left basilar infiltrate.  Recommend follow-up to resolution. Electronically Signed   By: Shanon Brow  Jimmye Norman III M.D   On: 09/23/2015 18:47    Micro Results    No results found for this or any previous visit (from the past 240 hour(s)).  Today   Subjective    [name] [name] today has no headache,no chest abdominal pain,no new weakness tingling or numbness, feels much better wants to go home today.     Objective   Blood pressure (!) 128/53, pulse (!) 55, temperature 98 F (36.7 C), temperature source Oral, resp. rate 20, height 5\' 3"  (1.6 m), weight 105 kg (231 lb 6.4 oz), SpO2 99 %.   Intake/Output Summary (Last 24 hours) at 09/25/15 0906 Last data filed at 09/24/15 1730  Gross per 24 hour  Intake              720 ml  Output                0 ml  Net              720 ml    Exam Awake Alert, Oriented x 3, No new F.N deficits, Normal affect [place].AT,PERRAL Supple Neck,No JVD, No cervical lymphadenopathy appriciated.  Symmetrical Chest wall movement, Good air movement bilaterally, CTAB RRR,No Gallops,Rubs or new Murmurs, No Parasternal Heave +ve B.Sounds, Abd Soft, Non tender, No organomegaly appriciated, No rebound -guarding or rigidity. No Cyanosis, Clubbing or edema, No new Rash or bruise   Data Review   CBC w Diff: Lab Results  Component Value Date   WBC 5.9 09/24/2015   HGB 10.8 (L) 09/24/2015   HCT 33.7 (L) 09/24/2015   PLT 129 (L) 09/24/2015   LYMPHOPCT 6 09/23/2015   MONOPCT 8 09/23/2015   EOSPCT 2 09/23/2015   BASOPCT 0 09/23/2015    CMP: Lab Results  Component Value Date   NA 140 09/24/2015   K 4.1 09/24/2015   CL 108 09/24/2015   CO2 27 09/24/2015   BUN 20 09/24/2015   CREATININE 1.09 (H) 09/24/2015   PROT 5.8 (L) 09/23/2015   ALBUMIN 3.5 09/23/2015   BILITOT 0.7 09/23/2015   ALKPHOS 122 09/23/2015   AST 82 (H) 09/23/2015   ALT 29 09/23/2015  .   Total Time in preparing paper  work, data evaluation and todays exam - 35 minutes  Thurnell Lose M.D on 09/25/2015 at 9:06 AM  [facility]   Office  [phone]

## 2015-09-25 NOTE — Discharge Instructions (Signed)
Follow with Primary MD [name], MD in 3-4 days   Get CBC, CMP, 2 view Chest X ray checked  by Primary MD or SNF MD in 5-7 days ( we routinely change or add medications that can affect your baseline labs and fluid status, therefore we recommend that you get the mentioned basic workup next visit with your PCP, your PCP may decide not to get them or add new tests based on their clinical decision)   Activity: As tolerated with Full fall precautions use walker/cane & assistance as needed   Disposition Home     Diet:   Heart Healthy    For Heart failure patients - Check your Weight same time everyday, if you gain over 2 pounds, or you develop in leg swelling, experience more shortness of breath or chest pain, call your Primary MD immediately. Follow Cardiac Low Salt Diet and 1.5 lit/day fluid restriction.   On your next visit with your primary care physician please Get Medicines reviewed and adjusted.   Please request your Prim.MD to go over all Hospital Tests and Procedure/Radiological results at the follow up, please get all Hospital records sent to your Prim MD by signing hospital release before you go home.   If you experience worsening of your admission symptoms, develop shortness of breath, life threatening emergency, suicidal or homicidal thoughts you must seek medical attention immediately by calling 911 or calling your MD immediately  if symptoms less severe.  You Must read complete instructions/literature along with all the possible adverse reactions/side effects for all the Medicines you take and that have been prescribed to you. Take any new Medicines after you have completely understood and accpet all the possible adverse reactions/side effects.   Do not drive, operate heavy machinery, perform activities at heights, swimming or participation in water activities or provide baby sitting services if your were admitted for syncope or siezures until you have seen by Primary MD or a  Neurologist and advised to do so again.  Do not drive when taking Pain medications.    Do not take more than prescribed Pain, Sleep and Anxiety Medications  Special Instructions: If you have smoked or chewed Tobacco  in the last 2 yrs please stop smoking, stop any regular Alcohol  and or any Recreational drug use.  Wear Seat belts while driving.   Please note  You were cared for by a hospitalist during your hospital stay. If you have any questions about your discharge medications or the care you received while you were in the hospital after you are discharged, you can call the unit and asked to speak with the hospitalist on call if the hospitalist that took care of you is not available. Once you are discharged, your primary care physician will handle any further medical issues. Please note that NO REFILLS for any discharge medications will be authorized once you are discharged, as it is imperative that you return to your primary care physician (or establish a relationship with a primary care physician if you do not have one) for your aftercare needs so that they can reassess your need for medications and monitor your lab values.

## 2015-09-28 DIAGNOSIS — J189 Pneumonia, unspecified organism: Secondary | ICD-10-CM | POA: Diagnosis not present

## 2015-09-28 DIAGNOSIS — M1990 Unspecified osteoarthritis, unspecified site: Secondary | ICD-10-CM | POA: Diagnosis not present

## 2015-09-28 DIAGNOSIS — Z6841 Body Mass Index (BMI) 40.0 and over, adult: Secondary | ICD-10-CM | POA: Diagnosis not present

## 2015-10-13 DIAGNOSIS — M25511 Pain in right shoulder: Secondary | ICD-10-CM | POA: Diagnosis not present

## 2015-10-13 DIAGNOSIS — M25512 Pain in left shoulder: Secondary | ICD-10-CM | POA: Diagnosis not present

## 2015-10-13 DIAGNOSIS — M542 Cervicalgia: Secondary | ICD-10-CM | POA: Diagnosis not present

## 2015-11-21 DIAGNOSIS — K219 Gastro-esophageal reflux disease without esophagitis: Secondary | ICD-10-CM | POA: Diagnosis not present

## 2015-11-21 DIAGNOSIS — R05 Cough: Secondary | ICD-10-CM | POA: Diagnosis not present

## 2015-11-21 DIAGNOSIS — Z6841 Body Mass Index (BMI) 40.0 and over, adult: Secondary | ICD-10-CM | POA: Diagnosis not present

## 2015-11-21 DIAGNOSIS — J189 Pneumonia, unspecified organism: Secondary | ICD-10-CM | POA: Diagnosis not present

## 2015-11-21 DIAGNOSIS — D649 Anemia, unspecified: Secondary | ICD-10-CM | POA: Diagnosis not present

## 2015-11-21 DIAGNOSIS — I10 Essential (primary) hypertension: Secondary | ICD-10-CM | POA: Diagnosis not present

## 2015-11-30 DIAGNOSIS — H353131 Nonexudative age-related macular degeneration, bilateral, early dry stage: Secondary | ICD-10-CM | POA: Diagnosis not present

## 2015-12-14 DIAGNOSIS — F419 Anxiety disorder, unspecified: Secondary | ICD-10-CM | POA: Diagnosis not present

## 2015-12-14 DIAGNOSIS — B373 Candidiasis of vulva and vagina: Secondary | ICD-10-CM | POA: Diagnosis not present

## 2015-12-14 DIAGNOSIS — J189 Pneumonia, unspecified organism: Secondary | ICD-10-CM | POA: Diagnosis not present

## 2015-12-14 DIAGNOSIS — K219 Gastro-esophageal reflux disease without esophagitis: Secondary | ICD-10-CM | POA: Diagnosis not present

## 2015-12-14 DIAGNOSIS — I10 Essential (primary) hypertension: Secondary | ICD-10-CM | POA: Diagnosis not present

## 2015-12-14 DIAGNOSIS — Z23 Encounter for immunization: Secondary | ICD-10-CM | POA: Diagnosis not present

## 2015-12-14 DIAGNOSIS — E876 Hypokalemia: Secondary | ICD-10-CM | POA: Diagnosis not present

## 2015-12-22 DIAGNOSIS — J189 Pneumonia, unspecified organism: Secondary | ICD-10-CM | POA: Diagnosis not present

## 2015-12-22 DIAGNOSIS — E669 Obesity, unspecified: Secondary | ICD-10-CM | POA: Diagnosis not present

## 2015-12-22 DIAGNOSIS — I10 Essential (primary) hypertension: Secondary | ICD-10-CM | POA: Diagnosis not present

## 2015-12-23 ENCOUNTER — Other Ambulatory Visit (HOSPITAL_COMMUNITY): Payer: Self-pay | Admitting: Pulmonary Disease

## 2015-12-23 ENCOUNTER — Other Ambulatory Visit (HOSPITAL_COMMUNITY): Payer: Self-pay | Admitting: Respiratory Therapy

## 2015-12-23 DIAGNOSIS — J690 Pneumonitis due to inhalation of food and vomit: Secondary | ICD-10-CM

## 2015-12-23 DIAGNOSIS — R0602 Shortness of breath: Secondary | ICD-10-CM

## 2015-12-23 DIAGNOSIS — J189 Pneumonia, unspecified organism: Secondary | ICD-10-CM

## 2015-12-27 ENCOUNTER — Telehealth (HOSPITAL_COMMUNITY): Payer: Self-pay | Admitting: Speech Pathology

## 2015-12-27 ENCOUNTER — Other Ambulatory Visit (HOSPITAL_COMMUNITY): Payer: Self-pay | Admitting: Pulmonary Disease

## 2015-12-27 DIAGNOSIS — R1310 Dysphagia, unspecified: Secondary | ICD-10-CM

## 2015-12-27 NOTE — Telephone Encounter (Signed)
confrimed apptment with patient on 12/27/15 NF

## 2016-01-02 ENCOUNTER — Ambulatory Visit (HOSPITAL_COMMUNITY)
Admission: RE | Admit: 2016-01-02 | Discharge: 2016-01-02 | Disposition: A | Discharge status: Home / Self Care | Payer: Medicare Other | Source: Ambulatory Visit | Attending: Pulmonary Disease | Admitting: Pulmonary Disease

## 2016-01-02 DIAGNOSIS — M40294 Other kyphosis, thoracic region: Secondary | ICD-10-CM | POA: Diagnosis not present

## 2016-01-02 DIAGNOSIS — I700 Atherosclerosis of aorta: Secondary | ICD-10-CM | POA: Insufficient documentation

## 2016-01-02 DIAGNOSIS — M4326 Fusion of spine, lumbar region: Secondary | ICD-10-CM | POA: Insufficient documentation

## 2016-01-02 DIAGNOSIS — J9811 Atelectasis: Secondary | ICD-10-CM | POA: Diagnosis not present

## 2016-01-02 DIAGNOSIS — J189 Pneumonia, unspecified organism: Secondary | ICD-10-CM | POA: Diagnosis not present

## 2016-01-02 DIAGNOSIS — B354 Tinea corporis: Secondary | ICD-10-CM | POA: Diagnosis not present

## 2016-01-02 DIAGNOSIS — L570 Actinic keratosis: Secondary | ICD-10-CM | POA: Diagnosis not present

## 2016-01-02 DIAGNOSIS — M4324 Fusion of spine, thoracic region: Secondary | ICD-10-CM | POA: Diagnosis not present

## 2016-01-02 LAB — POCT I-STAT CREATININE: Creatinine, Ser: 1.2 mg/dL — ABNORMAL HIGH (ref 0.44–1.00)

## 2016-01-02 MED ORDER — IOPAMIDOL (ISOVUE-300) INJECTION 61%
75.0000 mL | Freq: Once | INTRAVENOUS | Status: AC | PRN
Start: 2016-01-02 — End: 2016-01-02
  Administered 2016-01-02: 60 mL via INTRAVENOUS

## 2016-01-04 ENCOUNTER — Ambulatory Visit (HOSPITAL_COMMUNITY)
Admission: RE | Admit: 2016-01-04 | Discharge: 2016-01-04 | Disposition: A | Discharge status: Home / Self Care | Payer: Medicare Other | Source: Ambulatory Visit | Attending: Pulmonary Disease | Admitting: Pulmonary Disease

## 2016-01-04 DIAGNOSIS — J449 Chronic obstructive pulmonary disease, unspecified: Secondary | ICD-10-CM | POA: Insufficient documentation

## 2016-01-04 DIAGNOSIS — J690 Pneumonitis due to inhalation of food and vomit: Secondary | ICD-10-CM | POA: Insufficient documentation

## 2016-01-04 DIAGNOSIS — R0602 Shortness of breath: Secondary | ICD-10-CM | POA: Diagnosis not present

## 2016-01-04 DIAGNOSIS — R942 Abnormal results of pulmonary function studies: Secondary | ICD-10-CM | POA: Diagnosis not present

## 2016-01-04 LAB — PULMONARY FUNCTION TEST
DL/VA % pred: 80 %
DL/VA: 4.04 ml/min/mmHg/L
DLCO unc % pred: 63 %
DLCO unc: 17.14 ml/min/mmHg
FEF 25-75 Post: 2.38 L/sec
FEF 25-75 Pre: 1.4 L/sec
FEF2575-%Change-Post: 70 %
FEF2575-%Pred-Post: 133 %
FEF2575-%Pred-Pre: 78 %
FEV1-%Change-Post: 12 %
FEV1-%Pred-Post: 87 %
FEV1-%Pred-Pre: 77 %
FEV1-Post: 2.02 L
FEV1-Pre: 1.79 L
FEV1FVC-%Change-Post: 12 %
FEV1FVC-%Pred-Pre: 100 %
FEV6-%Change-Post: 0 %
FEV6-%Pred-Post: 81 %
FEV6-%Pred-Pre: 81 %
FEV6-Post: 2.38 L
FEV6-Pre: 2.37 L
FEV6FVC-%Pred-Post: 105 %
FEV6FVC-%Pred-Pre: 105 %
FVC-%Change-Post: 0 %
FVC-%Pred-Post: 77 %
FVC-%Pred-Pre: 77 %
FVC-Post: 2.38 L
FVC-Pre: 2.37 L
Post FEV1/FVC ratio: 85 %
Post FEV6/FVC ratio: 100 %
Pre FEV1/FVC ratio: 76 %
Pre FEV6/FVC Ratio: 100 %
RV % pred: 100 %
RV: 2.4 L
TLC % pred: 89 %
TLC: 4.8 L

## 2016-01-04 MED ORDER — ALBUTEROL SULFATE (2.5 MG/3ML) 0.083% IN NEBU
2.5000 mg | INHALATION_SOLUTION | Freq: Once | RESPIRATORY_TRACT | Status: AC
  Administered 2016-01-04: 2.5 mg via RESPIRATORY_TRACT

## 2016-01-09 ENCOUNTER — Ambulatory Visit (HOSPITAL_COMMUNITY)
Admission: RE | Admit: 2016-01-09 | Discharge: 2016-01-09 | Disposition: A | Discharge status: Home / Self Care | Payer: Medicare Other | Source: Ambulatory Visit | Attending: Pulmonary Disease | Admitting: Pulmonary Disease

## 2016-01-09 ENCOUNTER — Ambulatory Visit (HOSPITAL_COMMUNITY): Payer: Medicare Other | Attending: Pulmonary Disease | Admitting: Speech Pathology

## 2016-01-09 DIAGNOSIS — R1310 Dysphagia, unspecified: Secondary | ICD-10-CM | POA: Diagnosis not present

## 2016-01-09 DIAGNOSIS — R1312 Dysphagia, oropharyngeal phase: Secondary | ICD-10-CM | POA: Insufficient documentation

## 2016-01-09 DIAGNOSIS — K219 Gastro-esophageal reflux disease without esophagitis: Secondary | ICD-10-CM | POA: Diagnosis not present

## 2016-01-09 NOTE — Therapy (Signed)
[place], [place], [address] Phone: [phone]   Fax:  [phone]  Modified Barium Swallow  Patient Details  Name: [name] [name] MRN: [id number] Date of Birth: 03-09-1940 No Data Recorded  Encounter Date: 01/09/2016      End of Session - 01/09/16 1920    Visit Number 1   Number of Visits 1   Authorization Type Medicare   SLP Start Time 1310   SLP Stop Time  1339   SLP Time Calculation (min) 29 min   Activity Tolerance Patient tolerated treatment well      Past Medical History:  Diagnosis Date  . Anemia   . Anxiety   . Arthritis   . Depression   . GERD (gastroesophageal reflux disease)   . H/O hiatal hernia   . Hypertension   . Obesity   . Pneumonia   . PONV (postoperative nausea and vomiting)   . Seasonal allergies   . Sepsis ([place]) 10/02/2013    Past Surgical History:  Procedure Laterality Date  . ABDOMINAL HYSTERECTOMY    . APPENDECTOMY    . BACK SURGERY     lumbar x2 by Dr Arnoldo Morale  . CARPAL TUNNEL RELEASE Right 03/2014   Dr. Lorin Mercy  . CATARACT EXTRACTION W/PHACO Left 08/06/2013   Procedure: CATARACT EXTRACTION PHACO AND INTRAOCULAR LENS PLACEMENT LEFT EYE;  Surgeon: Tonny Branch, MD;  Location: [facility];  Service: Ophthalmology;  Laterality: Left;  CDE: 9.55  . CATARACT EXTRACTION W/PHACO Right 08/24/2013   Procedure: CATARACT EXTRACTION PHACO AND INTRAOCULAR LENS PLACEMENT (IOC);  Surgeon: Tonny Branch, MD;  Location: [facility];  Service: Ophthalmology;  Laterality: Right;  CDE:8.30  . CERVICAL FUSION    . CHOLECYSTECTOMY    . COLONOSCOPY N/A 11/03/2013   SLF: 1. Normal mucosa in the terminal ileum. 2. 13 colon polyps removed. 3. Moderate diverticulosis in the descending colon and sigmoid colon 4. the left colon is redundant 5. Small internal  hemorrhoids.  . ESOPHAGOGASTRODUODENOSCOPY N/A 11/03/2013   SLF: 1. 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  Mild Non-erosive gastritis  . EYE SURGERY    . GASTRIC BYPASS  1979   revision in 1980  . HIATAL HERNIA REPAIR     [facility] in 2011  . JOINT REPLACEMENT Right 1995   hip  . JOINT REPLACEMENT Left 2008   hip  . MEDIAL PARTIAL KNEE REPLACEMENT Left    Dr Ronnie Derby  . PARAESOPHAGEAL HERNIA REPAIR  SEP 2010 DR. MCNATT  . SHOULDER ARTHROSCOPY Right   . SHOULDER ARTHROSCOPY Right    x2  . SKIN BIOPSY    . TONSILLECTOMY    . 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   Recent Chest CT 01/02/2016:  IMPRESSION: 1. No pneumonia or acute pulmonary findings aside from mild right upper lobe atelectasis. Resolved left lower lobe atelectasis that was present in May. No emphysema or bronchiectasis. 2. Minimal to mild calcified aortic atherosclerosis. 3. 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  There were no vitals filed for this visit.      Subjective Assessment - 01/09/16 1914    Subjective "I get a lot of  saliva in my mouth."   Special Tests MBSS   Currently in Pain? No/denies             General - February 02, 2016 1915      General Information   Date of Onset 12/22/15   HPI [name] [name] is a 75 yo female who was referred by Dr. Luan Pulling for MBSS due to recurrent pneumonia. Pt denies difficulty swallowing, but does endorse history of esophageal dysphagia and gastric bypass surgery. She was previously followed by Dr. Oneida Alar who had recommended the gastric bypass diet, however pt's last follow up with GI was [facility] with Alvera Novel, MD (EGD).    Type of Study MBS-Modified Barium Swallow Study   Previous Swallow Assessment None on record   Diet Prior to this Study  Regular;Thin liquids   Temperature Spikes Noted No   Respiratory Status Room air   History of Recent Intubation No   Behavior/Cognition Alert;Cooperative;Pleasant mood   Oral Cavity Assessment Within Functional Limits   Oral Care Completed by SLP No   Oral Cavity - Dentition Adequate natural dentition   Vision Functional for self feeding   Self-Feeding Abilities Able to feed self   Patient Positioning Upright in chair   Baseline Vocal Quality Normal   Volitional Cough Strong   Volitional Swallow Able to elicit   Anatomy Within functional limits   Pharyngeal Secretions Not observed secondary MBS            Oral Preparation/Oral Phase - 02-Feb-2016 1918      Oral Preparation/Oral Phase   Oral Phase Within functional limits     Electrical stimulation - Oral Phase   Was Electrical Stimulation Used No          Pharyngeal Phase - 02-Feb-2016 1918      Pharyngeal Phase   Pharyngeal Phase Within functional limits     Electrical Stimulation - Pharyngeal Phase   Was Electrical Stimulation Used No          Cricopharyngeal Phase - 2016-02-02 1919      Cervical Esophageal Phase   Cervical Esophageal Phase Within functional limits           Plan - February 02, 2016 1920    Clinical Impression Statement Pt seen for MBSS and assessed in the lateral position while upright in Hausted chair with various barium-tinged textures and liquids. Oropharyngeal swallow is essentially WFL across textures and consistencies (thin, puree, regular textures, and barium tablet with thin). No penetration, aspiration, or stasis of bolus pre or post swallow noted. Esophageal sweep unremarkable. Pt with documented "sliding gastric pouch" per Dr. Oneida Alar' EGD in 2015. No oropharyngeal phase dysphagia identified at this time. Recommend that pt continue with gastric bypass diet per GI and reflux precautions.   Treatment/Interventions SLP instruction and feedback   Potential to Achieve Goals Good   Consulted and  Agree with Plan of Care Patient      Patient will benefit from skilled therapeutic intervention in order to improve the following deficits and impairments:   Dysphagia, oropharyngeal phase      G-Codes - 02-02-2016 1922    Functional Assessment Tool Used MBSS; clinical judgment   Functional Limitations Swallowing   Swallow Current Status [id number]) 0 percent impaired, limited or restricted   Swallow Goal Status [id number]) 0 percent impaired, limited or restricted   Swallow Discharge Status [id number]) 0 percent impaired, limited or restricted          Recommendations/Treatment - 2016/02/02 1919      Swallow  Evaluation Recommendations   SLP Diet Recommendations Age appropriate regular;Thin   Liquid Administration via Cup;Straw   Medication Administration Whole meds with liquid   Supervision Patient able to self feed   Postural Changes Seated upright at 90 degrees;Remain upright for at least 30 minutes after feeds/meals          Prognosis - 01/09/16 1919      Prognosis   Prognosis for Safe Diet Advancement Good     Individuals Consulted   Consulted and Agree with Results and Recommendations Patient   Report Sent to  Referring physician      Problem List Patient Active Problem List   Diagnosis Date Noted  . HCAP (healthcare-associated pneumonia) 09/23/2015  . AKI (acute kidney injury) ([place]) 07/21/2015  . Hypokalemia 07/21/2015  . CAP (community acquired pneumonia) 07/14/2015  . Abdominal pain 06/10/2014  . Folliculitis of perineum 02/24/2014  . Giant comedone 12/21/2013  . 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 Acrochordon 11/25/2013  . Dysphagia 10/29/2013  . Morbid obesity ([place]) 10/02/2013  . Hypertension   . GERD (gastroesophageal reflux disease)   . Anxiety   . Anemia of chronic disease   . Primary osteoarthritis of right shoulder 02/16/2013   Thank you,  Genene Churn, [place]  [facility] 01/09/2016, 7:23 PM  [place] [address] [place], [place], [address] Phone: [phone]   Fax:  [phone]  Name: AHMARIE UN MRN: [id number] Date of Birth: 02/04/1941

## 2016-01-19 DIAGNOSIS — B354 Tinea corporis: Secondary | ICD-10-CM | POA: Diagnosis not present

## 2016-01-19 DIAGNOSIS — L570 Actinic keratosis: Secondary | ICD-10-CM | POA: Diagnosis not present

## 2016-01-24 ENCOUNTER — Encounter (INDEPENDENT_AMBULATORY_CARE_PROVIDER_SITE_OTHER): Payer: Self-pay | Admitting: Internal Medicine

## 2016-01-24 ENCOUNTER — Encounter (INDEPENDENT_AMBULATORY_CARE_PROVIDER_SITE_OTHER): Payer: Self-pay

## 2016-02-07 DIAGNOSIS — R0602 Shortness of breath: Secondary | ICD-10-CM | POA: Diagnosis not present

## 2016-02-07 DIAGNOSIS — J189 Pneumonia, unspecified organism: Secondary | ICD-10-CM | POA: Diagnosis not present

## 2016-02-07 DIAGNOSIS — D649 Anemia, unspecified: Secondary | ICD-10-CM | POA: Diagnosis not present

## 2016-02-07 DIAGNOSIS — I10 Essential (primary) hypertension: Secondary | ICD-10-CM | POA: Diagnosis not present

## 2016-02-15 ENCOUNTER — Ambulatory Visit (INDEPENDENT_AMBULATORY_CARE_PROVIDER_SITE_OTHER): Payer: Medicare Other | Admitting: Internal Medicine

## 2016-02-15 ENCOUNTER — Encounter (INDEPENDENT_AMBULATORY_CARE_PROVIDER_SITE_OTHER): Payer: Self-pay | Admitting: Internal Medicine

## 2016-02-15 VITALS — BP 132/70 | HR 60 | Temp 98.1°F | Ht 63.0 in | Wt 228.9 lb

## 2016-02-15 DIAGNOSIS — K6289 Other specified diseases of anus and rectum: Secondary | ICD-10-CM | POA: Diagnosis not present

## 2016-02-15 MED ORDER — HYDROCORTISONE ACE-PRAMOXINE 1-1 % RE FOAM: 1.0000 | Freq: Two times a day (BID) | RECTAL | 1 refills | Status: DC

## 2016-02-15 NOTE — Progress Notes (Addendum)
Subjective:    Patient ID: [name] [name], female    DOB: 07-22-1940, 75 y.o.   MRN: [id number]  HPI Referred by Dr. Nadara Mustard. Has seen Dr. Oneida Alar and at [facility] as recently as 2016. She presents today with c/o rectal pain. She saw Dr. Glo Herring and Dr. Elonda Husky. She says this was a normal exam. She says she was given Mycolog which she says helped for her rectal pian.  She occasionally has dysphagia.   Her appetite is good. No weight loss.  She has a BM daily. No melena or BRRB.  She has nausea but not very often. She says it comes and goes.   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   Colonoscopy/EGD was in 2015 with polyps removed: Dr. Oneida Alar  EGD: Dysphagia most likely due to sliding gastric pounch and non-adherence to gastric by[ass diet. Mild non erosive gastritis.  Stomach biopsy: Gastric body type mucosa with associated mild chronic inflammation and foveolar hyperplasia. No evidence of H pylori, intestinal metaplasia, dysplasia or malignancy.  Colonoscopy: Normal mucosa in the terminal ileum. 13 colon polyps removed. Moderate diverticulosis of the descending colon and sigmoid colon. The left colon is redundant. Small internal hemorrhoids.  Biopsy: Tubular adenoma colon, ascending: sessile serrated polyp/adenoma. Descending, sigmoid, rectal: Tubular adenoma. Hyperplastic polyps and polypoid fragments of benign colonic mucosa.    She has a hx of gastric bypass in 1979 by Dr. Lindalou Hose.  Review of Systems Past Medical History:  Diagnosis Date  . Anemia   . Anxiety   . Arthritis   . Depression   . GERD (gastroesophageal reflux disease)   . H/O hiatal hernia   . Hypertension   . Obesity   . Pneumonia   . PONV (postoperative nausea and vomiting)   . Seasonal allergies   . Sepsis ([place]) 10/02/2013    Past Surgical History:  Procedure Laterality Date  . ABDOMINAL HYSTERECTOMY    . APPENDECTOMY    . BACK SURGERY     lumbar x2 by Dr Arnoldo Morale  . CARPAL TUNNEL  RELEASE Right 03/2014   Dr. Lorin Mercy  . CATARACT EXTRACTION W/PHACO Left 08/06/2013   Procedure: CATARACT EXTRACTION PHACO AND INTRAOCULAR LENS PLACEMENT LEFT EYE;  Surgeon: Tonny Branch, MD;  Location: [facility];  Service: Ophthalmology;  Laterality: Left;  CDE: 9.55  . CATARACT EXTRACTION W/PHACO Right 08/24/2013   Procedure: CATARACT EXTRACTION PHACO AND INTRAOCULAR LENS PLACEMENT (IOC);  Surgeon: Tonny Branch, MD;  Location: [facility];  Service: Ophthalmology;  Laterality: Right;  CDE:8.30  . CERVICAL FUSION    . CHOLECYSTECTOMY    . COLONOSCOPY N/A 11/03/2013   SLF: 1. Normal mucosa in the terminal ileum. 2. 13 colon polyps removed. 3. Moderate diverticulosis in the descending colon and sigmoid colon 4. the left colon is redundant 5. Small internal  hemorrhoids.  . ESOPHAGOGASTRODUODENOSCOPY N/A 11/03/2013   SLF: 1. 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 Mild Non-erosive gastritis  . EYE SURGERY    . GASTRIC BYPASS  1979   revision in 1980  . HIATAL HERNIA REPAIR     [facility] in 2011  . JOINT REPLACEMENT Right 1995   hip  . JOINT REPLACEMENT Left 2008   hip  . MEDIAL PARTIAL KNEE REPLACEMENT Left    Dr Ronnie Derby  . PARAESOPHAGEAL HERNIA REPAIR  SEP 2010 DR. MCNATT  . SHOULDER ARTHROSCOPY Right   . SHOULDER ARTHROSCOPY Right    x2  . SKIN BIOPSY    .  TONSILLECTOMY    . TOTAL SHOULDER ARTHROPLASTY Right 02/16/2013   Procedure: TOTAL SHOULDER ARTHROPLASTY;  Surgeon: Marybelle Killings, MD;  Location: [facility];  Service: Orthopedics;  Laterality: Right;  Right Total Shoulder Arthroplasty, Cemented    Allergies  Allergen Reactions  . Novocain [Procaine Hcl]     Unknown-passed out with novocaine injected for dental surgery.  . Other Hives    EKG pads - need to use pediatric pads  . Latex Rash  . Penicillins Rash    Has patient had a PCN reaction causing immediate rash, facial/tongue/throat swelling, SOB or lightheadedness with hypotension: Yes Has  patient had a PCN reaction causing severe rash involving mucus membranes or skin necrosis: No Has patient had a PCN reaction that required hospitalization No Has patient had a PCN reaction occurring within the last 10 years: No  If all of the above answers are "NO", then may proceed with Cephalosporin use.   . Sulfa Antibiotics Rash    Current Outpatient Prescriptions on File Prior to Visit  Medication Sig Dispense Refill  . acetaminophen (TYLENOL) 325 MG tablet Take 650 mg by mouth every 6 (six) hours as needed for mild pain.    Marland Kitchen albuterol (PROVENTIL) (2.5 MG/3ML) 0.083% nebulizer solution Take 3 mLs (2.5 mg total) by nebulization every 4 (four) hours as needed for wheezing. 75 mL 12  . Calcium Citrate-Vitamin D (CITRACAL PETITES/VITAMIN D) 200-250 MG-UNIT TABS Take 1 tablet by mouth daily.    Marland Kitchen dextromethorphan-guaiFENesin (MUCINEX DM) 30-600 MG 12hr tablet Take 1 tablet by mouth 2 (two) times daily.     . diazepam (VALIUM) 5 MG tablet Take 5 mg by mouth 3 (three) times daily as needed. For anxiety    . docusate sodium (COLACE) 100 MG capsule Take 100 mg by mouth at bedtime.     Marland Kitchen guaifenesin (ROBITUSSIN) 100 MG/5ML syrup Take 200 mg by mouth 3 (three) times daily as needed for cough.    . iron polysaccharides (NIFEREX) 150 MG capsule Take 1 capsule (150 mg total) by mouth 2 (two) times daily. (Patient taking differently: Take 325 mg by mouth daily. ) 60 capsule 1  . lisinopril-hydrochlorothiazide (PRINZIDE,ZESTORETIC) 20-12.5 MG tablet 1 tablet daily. Reported on 08/03/2015    . potassium chloride (K-DUR) 10 MEQ tablet Take 10 mEq by mouth 3 (three) times daily with meals.     . sertraline (ZOLOFT) 100 MG tablet Take 100 mg by mouth daily.    . traZODone (DESYREL) 50 MG tablet Take 50 mg by mouth at bedtime.    . vitamin B-12 (CYANOCOBALAMIN) 100 MCG tablet Take 500 mcg by mouth daily.     . fluocinonide cream (LIDEX) [id number] % Apply 1 application topically 2 (two) times daily. (Patient not  taking: Reported on 02/15/2016) 30 g 11   No current facility-administered medications on file prior to visit.        Objective:   Physical Exam Blood pressure 132/70, pulse 60, temperature 98.1 F (36.7 C), height 5\' 3"  (1.6 m), weight 228 lb 14.4 oz (103.8 kg). Alert and oriented. Skin warm and dry. Oral mucosa is moist.   . Sclera anicteric, conjunctivae is pink. Thyroid not enlarged. No cervical lymphadenopathy. Lungs clear. Heart regular rate and rhythm.  Abdomen is soft. Bowel sounds are positive. No hepatomegaly. No abdominal masses felt. No tenderness.  No edema to lower extremities.          Assessment & Plan:  Rectal pain. I did not seen any hemorrhoids. One skin  tag.  Needs to use a stool softener daily (2 a day). Am going to try Proctofoam BID  OV in 6 months.

## 2016-02-15 NOTE — Patient Instructions (Signed)
Rx for Proctofoam BID. OV in 6 months.

## 2016-02-23 ENCOUNTER — Ambulatory Visit (INDEPENDENT_AMBULATORY_CARE_PROVIDER_SITE_OTHER): Payer: Medicare Other | Admitting: Orthopaedic Surgery

## 2016-05-07 DIAGNOSIS — H26493 Other secondary cataract, bilateral: Secondary | ICD-10-CM | POA: Diagnosis not present

## 2016-05-07 DIAGNOSIS — H26492 Other secondary cataract, left eye: Secondary | ICD-10-CM | POA: Diagnosis not present

## 2016-07-08 ENCOUNTER — Emergency Department (HOSPITAL_COMMUNITY): Payer: Medicare Other

## 2016-07-08 ENCOUNTER — Encounter (HOSPITAL_COMMUNITY): Payer: Self-pay | Admitting: Emergency Medicine

## 2016-07-08 ENCOUNTER — Observation Stay (HOSPITAL_COMMUNITY)
Admission: EM | Admit: 2016-07-08 | Discharge: 2016-07-09 | Disposition: A | Discharge status: Home / Self Care | Payer: Medicare Other | Attending: Family Medicine | Admitting: Family Medicine

## 2016-07-08 DIAGNOSIS — R1030 Lower abdominal pain, unspecified: Secondary | ICD-10-CM | POA: Diagnosis not present

## 2016-07-08 DIAGNOSIS — F419 Anxiety disorder, unspecified: Secondary | ICD-10-CM | POA: Diagnosis not present

## 2016-07-08 DIAGNOSIS — R740 Nonspecific elevation of levels of transaminase and lactic acid dehydrogenase [LDH]: Secondary | ICD-10-CM | POA: Diagnosis not present

## 2016-07-08 DIAGNOSIS — K625 Hemorrhage of anus and rectum: Secondary | ICD-10-CM | POA: Diagnosis not present

## 2016-07-08 DIAGNOSIS — Z79899 Other long term (current) drug therapy: Secondary | ICD-10-CM | POA: Insufficient documentation

## 2016-07-08 DIAGNOSIS — K5900 Constipation, unspecified: Secondary | ICD-10-CM

## 2016-07-08 DIAGNOSIS — I10 Essential (primary) hypertension: Secondary | ICD-10-CM | POA: Insufficient documentation

## 2016-07-08 DIAGNOSIS — R1010 Upper abdominal pain, unspecified: Secondary | ICD-10-CM | POA: Diagnosis present

## 2016-07-08 DIAGNOSIS — R7401 Elevation of levels of liver transaminase levels: Secondary | ICD-10-CM

## 2016-07-08 DIAGNOSIS — K922 Gastrointestinal hemorrhage, unspecified: Principal | ICD-10-CM | POA: Insufficient documentation

## 2016-07-08 LAB — CBC WITH DIFFERENTIAL/PLATELET
Basophils Absolute: 0 10*3/uL (ref 0.0–0.1)
Basophils Relative: 0 %
Eosinophils Absolute: 0 10*3/uL (ref 0.0–0.7)
Eosinophils Relative: 0 %
HCT: 39.9 % (ref 36.0–46.0)
Hemoglobin: 13.2 g/dL (ref 12.0–15.0)
Lymphocytes Relative: 5 %
Lymphs Abs: 0.3 10*3/uL — ABNORMAL LOW (ref 0.7–4.0)
MCH: 31.4 pg (ref 26.0–34.0)
MCHC: 33.1 g/dL (ref 30.0–36.0)
MCV: 95 fL (ref 78.0–100.0)
Monocytes Absolute: 0.8 10*3/uL (ref 0.1–1.0)
Monocytes Relative: 13 %
Neutro Abs: 4.8 10*3/uL (ref 1.7–7.7)
Neutrophils Relative %: 82 %
Platelets: 131 10*3/uL — ABNORMAL LOW (ref 150–400)
RBC: 4.2 MIL/uL (ref 3.87–5.11)
RDW: 13.8 % (ref 11.5–15.5)
WBC: 6 10*3/uL (ref 4.0–10.5)

## 2016-07-08 LAB — RAPID URINE DRUG SCREEN, HOSP PERFORMED
Amphetamines: NOT DETECTED
Barbiturates: NOT DETECTED
Benzodiazepines: POSITIVE — AB
Cocaine: NOT DETECTED
Opiates: NOT DETECTED
Tetrahydrocannabinol: NOT DETECTED

## 2016-07-08 LAB — URINALYSIS, ROUTINE W REFLEX MICROSCOPIC
Bilirubin Urine: NEGATIVE
Glucose, UA: NEGATIVE mg/dL
Hgb urine dipstick: NEGATIVE
Ketones, ur: NEGATIVE mg/dL
Leukocytes, UA: NEGATIVE
Nitrite: NEGATIVE
Protein, ur: NEGATIVE mg/dL
Specific Gravity, Urine: 1.017 (ref 1.005–1.030)
pH: 7 (ref 5.0–8.0)

## 2016-07-08 LAB — HEMOGLOBIN AND HEMATOCRIT, BLOOD
HCT: 40 % (ref 36.0–46.0)
HCT: 36 % (ref 36.0–46.0)
Hemoglobin: 13.3 g/dL (ref 12.0–15.0)
Hemoglobin: 11.5 g/dL — ABNORMAL LOW (ref 12.0–15.0)

## 2016-07-08 LAB — COMPREHENSIVE METABOLIC PANEL
ALT: 429 U/L — ABNORMAL HIGH (ref 14–54)
AST: 740 U/L — ABNORMAL HIGH (ref 15–41)
Albumin: 3.6 g/dL (ref 3.5–5.0)
Alkaline Phosphatase: 352 U/L — ABNORMAL HIGH (ref 38–126)
Anion gap: 6 (ref 5–15)
BUN: 22 mg/dL — ABNORMAL HIGH (ref 6–20)
CO2: 28 mmol/L (ref 22–32)
Calcium: 8.7 mg/dL — ABNORMAL LOW (ref 8.9–10.3)
Chloride: 104 mmol/L (ref 101–111)
Creatinine, Ser: 1.09 mg/dL — ABNORMAL HIGH (ref 0.44–1.00)
GFR calc Af Amer: 56 mL/min — ABNORMAL LOW (ref >60)
GFR calc non Af Amer: 48 mL/min — ABNORMAL LOW (ref >60)
Glucose, Bld: 126 mg/dL — ABNORMAL HIGH (ref 65–99)
Potassium: 3.9 mmol/L (ref 3.5–5.1)
Sodium: 138 mmol/L (ref 135–145)
Total Bilirubin: 1.7 mg/dL — ABNORMAL HIGH (ref 0.3–1.2)
Total Protein: 6.2 g/dL — ABNORMAL LOW (ref 6.5–8.1)

## 2016-07-08 LAB — ACETAMINOPHEN LEVEL: Acetaminophen (Tylenol), Serum: <10 ug/mL — ABNORMAL LOW (ref 10–30)

## 2016-07-08 LAB — PROTIME-INR
INR: 1.08
Prothrombin Time: 14.1 seconds (ref 11.4–15.2)

## 2016-07-08 LAB — SALICYLATE LEVEL: Salicylate Lvl: <7 mg/dL (ref 2.8–30.0)

## 2016-07-08 LAB — ETHANOL: Alcohol, Ethyl (B): <5 mg/dL (ref <5)

## 2016-07-08 LAB — LIPASE, BLOOD: Lipase: 17 U/L (ref 11–51)

## 2016-07-08 MED ORDER — ACETAMINOPHEN 325 MG PO TABS: 650.0000 mg | ORAL_TABLET | Freq: Four times a day (QID) | ORAL | Status: DC | PRN

## 2016-07-08 MED ORDER — SODIUM CHLORIDE 0.9 % IV SOLN
INTRAVENOUS | Status: AC
  Administered 2016-07-08: 17:00:00 via INTRAVENOUS

## 2016-07-08 MED ORDER — TRAZODONE HCL 50 MG PO TABS
50.0000 mg | ORAL_TABLET | Freq: Every day | ORAL | Status: DC
  Administered 2016-07-08: 50 mg via ORAL
  Filled 2016-07-08: qty 1

## 2016-07-08 MED ORDER — ACETAMINOPHEN 650 MG RE SUPP: 650.0000 mg | Freq: Four times a day (QID) | RECTAL | Status: DC | PRN

## 2016-07-08 MED ORDER — HEPARIN SODIUM (PORCINE) 5000 UNIT/ML IJ SOLN
5000.0000 [IU] | Freq: Three times a day (TID) | INTRAMUSCULAR | Status: DC
  Administered 2016-07-08: 5000 [IU] via SUBCUTANEOUS
  Filled 2016-07-08: qty 1

## 2016-07-08 NOTE — ED Triage Notes (Signed)
Pt reports intermittent straining with BM, abd pain x2 weeks. Pt reports history of same. LNBM 07/07/16.

## 2016-07-08 NOTE — ED Notes (Signed)
Pt reports she had 2 BM this morning that were very hard, reports she had to strain to have a BM. Then about 3 am she was straining to have another BM and noticed "medium red" blood in her stool. Pt reports lower abd pain at this time and rectal pain when straining. Pt denies any nausea/ vomiting or other sx at this time.

## 2016-07-08 NOTE — ED Provider Notes (Signed)
[facility] DEPT Provider Note   CSN: [id number] Arrival date & time: 07/08/16  1022   By signing my name below, I, [name], attest that this documentation has been prepared under the direction and in the presence of Noemi Chapel, MD. Electronically Signed: [name], Scribe. 07/08/16. 11:27 AM. History   Chief Complaint Chief Complaint  Patient presents with  . Constipation    The history is provided by the patient. No language interpreter was used.  HPI Comments: [name] [name] is a 76 y.o. female brought in by ambulance, who presents to the Emergency Department complaining of constipation with associated abdominal pain for the past few weeks. She has been taking stool softeners and laxatives daily with no significant relief. Her last normal bowel movement was last night. She had a bowel movement this morning around 3 am and noticed some blood in her stool. She has had multiple GI surgeries in the past. She states that she has had a gastric bypass and multiple colonoscopies with the removal of multiple polyps. She also reports intermittent fevers with a Tmax of 101 over the past few weeks. She is not on any blood thinners. She denies nausea or vomiting. She has difficulty passing stuff through her gastric pouch on occasion.  Past Medical History:  Diagnosis Date  . Anemia   . Anxiety   . Arthritis   . Depression   . GERD (gastroesophageal reflux disease)   . H/O hiatal hernia   . Hypertension   . Obesity   . Pneumonia   . PONV (postoperative nausea and vomiting)   . Seasonal allergies   . Sepsis ([place]) 10/02/2013    Patient Active Problem List   Diagnosis Date Noted  . HCAP (healthcare-associated pneumonia) 09/23/2015  . AKI (acute kidney injury) ([place]) 07/21/2015  . Hypokalemia 07/21/2015  . CAP (community acquired pneumonia) 07/14/2015  . Abdominal pain 06/10/2014  . Folliculitis of perineum 02/24/2014  . Giant comedone 12/21/2013  . 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 Acrochordon 11/25/2013  . Dysphagia 10/29/2013  . Morbid obesity ([place]) 10/02/2013  . Hypertension   . GERD (gastroesophageal reflux disease)   . Anxiety   . Anemia of chronic disease   . 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 ABDOMINAL HYSTERECTOMY    . APPENDECTOMY    . BACK SURGERY     lumbar x2 by Dr Arnoldo Morale  . CARPAL TUNNEL RELEASE Right 03/2014   Dr. Lorin Mercy  . CATARACT EXTRACTION W/PHACO Left 08/06/2013   Procedure: CATARACT EXTRACTION PHACO AND INTRAOCULAR LENS PLACEMENT LEFT EYE;  Surgeon: Tonny Branch, MD;  Location: [facility];  Service: Ophthalmology;  Laterality: Left;  CDE: 9.55  . CATARACT EXTRACTION W/PHACO Right 08/24/2013   Procedure: CATARACT EXTRACTION PHACO AND INTRAOCULAR LENS PLACEMENT (IOC);  Surgeon: Tonny Branch, MD;  Location: [facility];  Service: Ophthalmology;  Laterality: Right;  CDE:8.30  . CERVICAL FUSION    . CHOLECYSTECTOMY    . COLONOSCOPY N/A 11/03/2013   SLF: 1. Normal mucosa in the terminal ileum. 2. 13 colon polyps removed. 3. Moderate diverticulosis in the descending colon and sigmoid colon 4. the left colon is redundant 5. Small internal  hemorrhoids.  . ESOPHAGOGASTRODUODENOSCOPY N/A 11/03/2013   SLF: 1. 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 Mild Non-erosive gastritis  . EYE SURGERY    . GASTRIC BYPASS  1979   revision  in 1980  . HIATAL HERNIA REPAIR     [facility] in 2011  . JOINT REPLACEMENT Right 1995   hip  . JOINT REPLACEMENT Left 2008   hip  . MEDIAL PARTIAL KNEE REPLACEMENT Left    Dr Ronnie Derby  . PARAESOPHAGEAL HERNIA REPAIR  SEP 2010 DR. MCNATT  . SHOULDER ARTHROSCOPY Right   . SHOULDER ARTHROSCOPY Right    x2  . SKIN BIOPSY    . TONSILLECTOMY    . TOTAL SHOULDER ARTHROPLASTY Right 02/16/2013   Procedure: TOTAL SHOULDER ARTHROPLASTY;  Surgeon: Marybelle Killings, MD;  Location: [facility];  Service:  Orthopedics;  Laterality: Right;  Right Total Shoulder Arthroplasty, Cemented    OB History    No data available       Home Medications    Prior to Admission medications   Medication Sig Start Date End Date Taking? Authorizing Provider  acetaminophen (TYLENOL) 325 MG tablet Take 650 mg by mouth every 6 (six) hours as needed for mild pain.    [provider]  albuterol (PROVENTIL) (2.5 MG/3ML) 0.083% nebulizer solution Take 3 mLs (2.5 mg total) by nebulization every 4 (four) hours as needed for wheezing. 07/21/15   Kathie Dike, MD  Calcium Citrate-Vitamin D (CITRACAL PETITES/VITAMIN D) 200-250 MG-UNIT TABS Take 1 tablet by mouth daily.    [provider]  dextromethorphan-guaiFENesin (MUCINEX DM) 30-600 MG 12hr tablet Take 1 tablet by mouth 2 (two) times daily.     [provider]  diazepam (VALIUM) 5 MG tablet Take 5 mg by mouth 3 (three) times daily as needed. For anxiety    [provider]  docusate sodium (COLACE) 100 MG capsule Take 100 mg by mouth at bedtime.     [provider]  fluocinonide cream (LIDEX) 4.09 % Apply 1 application topically 2 (two) times daily. Patient not taking: Reported on 02/15/2016 04/14/15   Florian Buff, MD  guaifenesin (ROBITUSSIN) 100 MG/5ML syrup Take 200 mg by mouth 3 (three) times daily as needed for cough.    [provider]  hydrocortisone-pramoxine (PROCTOFOAM HC) rectal foam Place 1 applicator rectally 2 (two) times daily. 02/15/16   Setzer, Rona Ravens, NP  iron polysaccharides (NIFEREX) 150 MG capsule Take 1 capsule (150 mg total) by mouth 2 (two) times daily. Patient taking differently: Take 325 mg by mouth daily.  10/03/13   Kathie Dike, MD  lisinopril-hydrochlorothiazide (PRINZIDE,ZESTORETIC) 20-12.5 MG tablet 1 tablet daily. Reported on 08/03/2015 06/16/15   [provider]  omeprazole (PRILOSEC) 20 MG capsule Take 20 mg by mouth 2 (two) times daily before a meal.    [provider]  ondansetron (ZOFRAN) 4 MG tablet Take 4 mg by mouth every 8 (eight) hours as needed for nausea or vomiting.    [provider]  potassium chloride (K-DUR) 10 MEQ tablet Take 10 mEq by mouth 3 (three) times daily with meals.     [provider]  sertraline (ZOLOFT) 100 MG tablet Take 100 mg by mouth daily.    [provider]  traZODone (DESYREL) 50 MG tablet Take 50 mg by mouth at bedtime.    [provider]  vitamin B-12 (CYANOCOBALAMIN) 100 MCG tablet Take 500 mcg by mouth daily.     [provider]    Family History Family History  Problem Relation Age of Onset  . Colon cancer Brother     IN HIS 60s-METASTATIC  . Hypertension Brother   . Colon polyps Paternal Grandfather   .  Breast cancer Mother   . Hypertension Father     Social History Social History  Substance Use Topics  . Smoking status: Never Smoker  . Smokeless tobacco: Never Used  . Alcohol use No     Allergies   Novocain [procaine hcl]; Other; Latex; Penicillins; and Sulfa antibiotics   Review of Systems Review of Systems  Constitutional: Positive for fever.  Respiratory: Negative for shortness of breath.   Gastrointestinal: Positive for abdominal pain, blood in stool and constipation. Negative for nausea and vomiting.  All other systems reviewed and are negative.    Physical Exam Updated Vital Signs BP 125/67 (BP Location: Right Arm)   Pulse 82   Temp 98.9 F (37.2 C) (Oral)   Resp 18   Ht 5\' 3"  (1.6 m)   Wt 226 lb (102.5 kg)   SpO2 95%   BMI 40.03 kg/m   Physical Exam  Constitutional: She is oriented to person, place, and time. She appears well-developed and well-nourished. No distress.  HENT:  Head: Normocephalic and atraumatic.  Mouth/Throat: Oropharynx is clear and moist. No oropharyngeal exudate.  Eyes: Conjunctivae and EOM are normal. Pupils are equal, round, and reactive to light. Right eye exhibits no discharge. Left eye  exhibits no discharge. No scleral icterus.  Neck: Normal range of motion. Neck supple. No JVD present. No tracheal deviation present. No thyromegaly present.  Cardiovascular: Normal rate, regular rhythm, normal heart sounds and intact distal pulses.  Exam reveals no gallop and no friction rub.   No murmur heard. Pulmonary/Chest: Effort normal and breath sounds normal. No respiratory distress. She has no wheezes. She has no rales.  Abdominal: Soft. Bowel sounds are normal. She exhibits no distension and no mass. There is no tenderness.  Genitourinary:  Genitourinary Comments: See following Chaperone present - [employer] Student [employer]  Musculoskeletal: Normal range of motion. She exhibits no edema or tenderness.  Lymphadenopathy:    She has no cervical adenopathy.  Neurological: She is alert and oriented to person, place, and time. Coordination normal.  Skin: Skin is warm and dry. No rash noted. No erythema.  Psychiatric: She has a normal mood and affect. Her behavior is normal.  Nursing note and vitals reviewed.    ED Treatments / Results  DIAGNOSTIC STUDIES: Oxygen Saturation is 95% on RA, adequate by my interpretation.    COORDINATION OF CARE: 11:01 AM Discussed treatment plan with pt at bedside and pt agreed to plan.  Labs (all labs ordered are listed, but only abnormal results are displayed) Labs Reviewed  CBC WITH DIFFERENTIAL/PLATELET - Abnormal; Notable for the following:       Result Value   Platelets 131 (*)    Lymphs Abs 0.3 (*)    All other components within normal limits  COMPREHENSIVE METABOLIC PANEL  URINALYSIS, ROUTINE W REFLEX MICROSCOPIC    Procedure Note:  Anoscopy  Risks benefits alternatives of the procedure given to the patient Verbal Consent obtained Patient placed in the lateral decubitus position Anoscopy performed Findings  No hemorrhoids or fissures or masses - gross blood in rectal vault present. Patient tolerated procedure without any  complaints  Transaminitis present, unexplained, right upper quadrant ultrasound ordered, due to GI bleed the patient will be admitted to the hospital. Discussed with the hospitalist who will admit.  Radiology No results found.  Procedures Procedures (including critical care time)  Medications Ordered in ED Medications - No data to display   Initial Impression / Assessment and Plan / ED Course  I  have reviewed the triage vital signs and the nursing notes.  Pertinent labs & imaging results that were available during my care of the patient were reviewed by me and considered in my medical decision making (see chart for details).   Final Clinical Impressions(s) / ED Diagnoses   Final diagnoses:  Transaminitis  Lower GI bleed    New Prescriptions New Prescriptions   No medications on file   I personally performed the services described in this documentation, which was scribed in my presence. The recorded information has been reviewed and is accurate.      Noemi Chapel, MD 07/08/16 1351

## 2016-07-08 NOTE — Progress Notes (Addendum)
Pt educated on SCD ordered, pt states she doesn't want SCDs because she had them a long time ago and they gave her blisters and she had to have a skin graft. Educated pt on use of SCDs to prevent blood clot since she isn't as active in the hospital. Pt stated she had been getting up and going to bathroom. Also, Dr. Olevia Bowens paged and made aware that none of pts home medications had been ordered. RN asked Dr. Olevia Bowens to please order pts Trazadone.

## 2016-07-08 NOTE — H&P (Signed)
History and Physical    [name] [name]: 11-21-1940 DOA: 07/08/2016  PCP: [name], MD   Patient coming from: Home  Chief Complaint: Abdominal pain, hematochezia  HPI: [name] [name] is a 76 y.o. female with medical history significant of anxiety, arthritis, depression, GERD, HTN, that presents with diarrhea for the past 3 weeks that turned to constipation.  She states that she goes between 1-3 days without a bowel movement however that did not happen this time- she had diarrhea that ultimately ceased and she then felt as though she became constipated.  She voices she gets this feeling frequently secondary to her gastric bypass that was years ago and that she feels food "gets stuff in her pouch".  She takes a stool softener nightly and if she doesn't have a bowel movement will take a laxative.  Last laxative was 3 days prior to admission.  She says she had three bowel movements yesterday and then thinks she had 1 bowel movement the day before yesterday.  She cannot explain when exactly she had constipation.  Patient then states that she felt as though the rice she had eaten got stuck in her pouch and so she used a suppository to help with her feeling of being blocked. She voices she has also been vomiting mostly water and bile.  Patient is a poor historian and provides history of workups for conditions years ago.    ED Course: patient underwent digital rectal exam as well as anoscopy both of which showed frank blood in rectal vault.  Per EDP blood appeared to be coming from higher above the rectum.  Patient was also found to have elevated LFT's.  Hepatitis panel was collected and is pending.  US of the abdomen showing no gallbladder (h/o cholecystectomy) and hepatic steasosis.  Abdominal xray showing no dilated bowel loops.  TRH asked to admit for possible gastrointestinal bleeding and transaminitis.  Review of Systems: As per HPI otherwise 10 point review of systems negative.     Past Medical History:  Diagnosis Date  . Anemia   . Anxiety   . Arthritis   . Depression   . GERD (gastroesophageal reflux disease)   . H/O hiatal hernia   . Hypertension   . Obesity   . Pneumonia   . PONV (postoperative nausea and vomiting)   . Seasonal allergies   . Sepsis ([place]) 10/02/2013    Past Surgical History:  Procedure Laterality Date  . ABDOMINAL HYSTERECTOMY    . APPENDECTOMY    . BACK SURGERY     lumbar x2 by Dr Arnoldo Morale  . CARPAL TUNNEL RELEASE Right 03/2014   Dr. Lorin Mercy  . CATARACT EXTRACTION W/PHACO Left 08/06/2013   Procedure: CATARACT EXTRACTION PHACO AND INTRAOCULAR LENS PLACEMENT LEFT EYE;  Surgeon: Tonny Branch, MD;  Location: [facility];  Service: Ophthalmology;  Laterality: Left;  CDE: 9.55  . CATARACT EXTRACTION W/PHACO Right 08/24/2013   Procedure: CATARACT EXTRACTION PHACO AND INTRAOCULAR LENS PLACEMENT (IOC);  Surgeon: Tonny Branch, MD;  Location: [facility];  Service: Ophthalmology;  Laterality: Right;  CDE:8.30  . CERVICAL FUSION    . CHOLECYSTECTOMY    . COLONOSCOPY N/A 11/03/2013   SLF: 1. Normal mucosa in the terminal ileum. 2. 13 colon polyps removed. 3. Moderate diverticulosis in the descending colon and sigmoid colon 4. the left colon is redundant 5. Small internal  hemorrhoids.  . ESOPHAGOGASTRODUODENOSCOPY N/A 11/03/2013   SLF: 1. Dysphagia most likely due to sliding gastic pouch and non- adherence  to gastric bypass diet 2. Mild Non-erosive gastritis  . EYE SURGERY    . GASTRIC BYPASS  1979   revision in 1980  . HIATAL HERNIA REPAIR     [facility] in 2011  . JOINT REPLACEMENT Right 1995   hip  . JOINT REPLACEMENT Left 2008   hip  . MEDIAL PARTIAL KNEE REPLACEMENT Left    Dr Ronnie Derby  . PARAESOPHAGEAL HERNIA REPAIR  SEP 2010 DR. MCNATT  . SHOULDER ARTHROSCOPY Right   . SHOULDER ARTHROSCOPY Right    x2  . SKIN BIOPSY    . TONSILLECTOMY    . TOTAL SHOULDER ARTHROPLASTY Right 02/16/2013   Procedure: TOTAL SHOULDER ARTHROPLASTY;  Surgeon: Marybelle Killings, MD;   Location: [facility];  Service: Orthopedics;  Laterality: Right;  Right Total Shoulder Arthroplasty, Cemented     reports that she has never smoked. She has never used smokeless tobacco. She reports that she does not drink alcohol or use drugs.  Allergies  Allergen Reactions  . Novocain [Procaine Hcl]     Unknown-passed out with novocaine injected for dental surgery.  . Other Hives    EKG pads - need to use pediatric pads  . Latex Rash  . Penicillins Rash    Has patient had a PCN reaction causing immediate rash, facial/tongue/throat swelling, SOB or lightheadedness with hypotension: Yes Has patient had a PCN reaction causing severe rash involving mucus membranes or skin necrosis: No Has patient had a PCN reaction that required hospitalization No Has patient had a PCN reaction occurring within the last 10 years: No  If all of the above answers are "NO", then may proceed with Cephalosporin use.   . Sulfa Antibiotics Rash    Family History  Problem Relation Age of Onset  . Colon cancer Brother     IN HIS 60s-METASTATIC  . Hypertension Brother   . Colon polyps Paternal Grandfather   . Breast cancer Mother   . Hypertension Father      Prior to Admission medications   Medication Sig Start Date End Date Taking? Authorizing Provider  acetaminophen (TYLENOL) 325 MG tablet Take 650 mg by mouth every 6 (six) hours as needed for mild pain.   Yes [provider]  albuterol (PROVENTIL) (2.5 MG/3ML) 0.083% nebulizer solution Take 3 mLs (2.5 mg total) by nebulization every 4 (four) hours as needed for wheezing. 07/21/15  Yes Kathie Dike, MD  Calcium Citrate-Vitamin D (CITRACAL PETITES/VITAMIN D) 200-250 MG-UNIT TABS Take 1 tablet by mouth daily.   Yes [provider]  dextromethorphan-guaiFENesin (MUCINEX DM) 30-600 MG 12hr tablet Take 1 tablet by mouth 2 (two) times daily.    Yes [provider]  diazepam (VALIUM) 5 MG tablet Take 5 mg by mouth 3 (three) times daily  as needed. For anxiety   Yes [provider]  docusate sodium (COLACE) 100 MG capsule Take 100 mg by mouth at bedtime.    Yes [provider]  guaifenesin (ROBITUSSIN) 100 MG/5ML syrup Take 200 mg by mouth 3 (three) times daily as needed for cough.   Yes [provider]  hydrocortisone-pramoxine (PROCTOFOAM HC) rectal foam Place 1 applicator rectally 2 (two) times daily. 02/15/16  Yes Setzer, Terri L, NP  iron polysaccharides (NIFEREX) 150 MG capsule Take 1 capsule (150 mg total) by mouth 2 (two) times daily. Patient taking differently: Take 325 mg by mouth daily.  10/03/13  Yes Kathie Dike, MD  lisinopril-hydrochlorothiazide (PRINZIDE,ZESTORETIC) 20-12.5 MG tablet 1 tablet daily. Reported on 08/03/2015 06/16/15  Yes [provider]  omeprazole (PRILOSEC) 20 MG capsule Take 20 mg by mouth 2 (two) times daily before a meal.   Yes [provider]  ondansetron (ZOFRAN) 4 MG tablet Take 4 mg by mouth every 8 (eight) hours as needed for nausea or vomiting.   Yes [provider]  potassium chloride (K-DUR) 10 MEQ tablet Take 10 mEq by mouth 3 (three) times daily with meals.    Yes [provider]  sertraline (ZOLOFT) 100 MG tablet Take 100 mg by mouth daily.   Yes [provider]  traZODone (DESYREL) 50 MG tablet Take 50 mg by mouth at bedtime.   Yes [provider]  vitamin B-12 (CYANOCOBALAMIN) 100 MCG tablet Take 500 mcg by mouth daily.    Yes [provider]    Physical Exam: Vitals:   07/08/16 1025 07/08/16 1029  BP:  125/67  Pulse:  82  Resp:  18  Temp: 98.9 F (37.2 C)   TempSrc: Oral   SpO2:  95%  Weight: 102.5 kg (226 lb)   Height: 5\' 3"  (1.6 m)       Constitutional: NAD, calm, comfortable Vitals:   07/08/16 1025 07/08/16 1029  BP:  125/67  Pulse:  82  Resp:  18  Temp: 98.9 F (37.2 C)   TempSrc: Oral   SpO2:  95%  Weight: 102.5 kg (226 lb)   Height: 5\' 3"  (1.6 m)    Eyes: PERRL,  lids and conjunctivae normal ENMT: Mucous membranes are moist. Posterior pharynx clear of any exudate or lesions. Poor dentition  Neck: normal, supple, no masses, no thyromegaly Respiratory: clear to auscultation bilaterally, no wheezing, no crackles. Normal respiratory effort. No accessory muscle use.  Cardiovascular: Regular rate and rhythm, no murmurs / rubs / gallops. No extremity edema. 2+ pedal pulses. No carotid bruits.  Abdomen: tenderness in periumbilical area, no masses palpated. No hepatosplenomegaly. Bowel sounds positive.  Musculoskeletal: no clubbing / cyanosis. No joint deformity upper and lower extremities. Good ROM, no contractures. Normal muscle tone.  Skin: no rashes, lesions, ulcers. No induration Neurologic: CN 2-12 grossly intact. Sensation intact, DTR normal. Strength 5/5 in all 4.  Psychiatric: Poor insight. Alert and oriented x 3. Normal mood.    Labs on Admission: I have personally reviewed following labs and imaging studies  CBC:  Recent Labs Lab 07/08/16 1106  WBC 6.0  NEUTROABS 4.8  HGB 13.2  HCT 39.9  MCV 95.0  PLT 354*   Basic Metabolic Panel:  Recent Labs Lab 07/08/16 1101  NA 138  K 3.9  CL 104  CO2 28  GLUCOSE 126*  BUN 22*  CREATININE 1.09*  CALCIUM 8.7*   GFR: Estimated Creatinine Clearance: 51 mL/min (A) (by C-G formula based on SCr of 1.09 mg/dL (H)). Liver Function Tests:  Recent Labs Lab 07/08/16 1101  AST 740*  ALT 429*  ALKPHOS 352*  BILITOT 1.7*  PROT 6.2*  ALBUMIN 3.6    Recent Labs Lab 07/08/16 1100  LIPASE 17   No results for input(s): AMMONIA in the last 168 hours. Coagulation Profile:  Recent Labs Lab 07/08/16 1100  INR 1.08   Cardiac Enzymes: No results for input(s): CKTOTAL, CKMB, CKMBINDEX, TROPONINI in the last 168 hours. BNP (last 3 results) No results for input(s): PROBNP in the last 8760 hours. HbA1C: No results for input(s): HGBA1C in the last 72 hours. CBG: No results for input(s):  GLUCAP in the last 168 hours. Lipid Profile: No results for input(s): CHOL,  HDL, LDLCALC, TRIG, CHOLHDL, LDLDIRECT in the last 72 hours. Thyroid Function Tests: No results for input(s): TSH, T4TOTAL, FREET4, T3FREE, THYROIDAB in the last 72 hours. Anemia Panel: No results for input(s): VITAMINB12, FOLATE, FERRITIN, TIBC, IRON, RETICCTPCT in the last 72 hours. Urine analysis:    Component Value Date/Time   COLORURINE AMBER (A) 07/08/2016 1114   APPEARANCEUR CLEAR 07/08/2016 1114   LABSPEC 1.017 07/08/2016 1114   PHURINE 7.0 07/08/2016 1114   GLUCOSEU NEGATIVE 07/08/2016 1114   HGBUR NEGATIVE 07/08/2016 1114   BILIRUBINUR NEGATIVE 07/08/2016 1114   KETONESUR NEGATIVE 07/08/2016 1114   PROTEINUR NEGATIVE 07/08/2016 1114   UROBILINOGEN 0.2 12/28/2013 1310   NITRITE NEGATIVE 07/08/2016 1114   LEUKOCYTESUR NEGATIVE 07/08/2016 1114   Sepsis Labs: !!!!!!!!!!!!!!!!!!!!!!!!!!!!!!!!!!!!!!!!!!!! @LABRCNTIP (procalcitonin:4,lacticidven:4) )No results found for this or any previous visit (from the past 240 hour(s)).   Radiological Exams on Admission: Dg Abd Acute W/chest  Result Date: 07/08/2016 CLINICAL DATA:  Abdominal distention and vomiting for 1 day. EXAM: DG ABDOMEN ACUTE W/ 1V CHEST COMPARISON:  11/21/2015 radiographs and prior studies FINDINGS: Cardiomegaly and mild interstitial prominence again noted. No airspace disease, consolidation, pleural effusion or pneumothorax noted. Nondistended gas-filled loops of small bowel are identified within the abdomen. Gas and stool within the colon noted. No dilated bowel loops or pneumoperitoneum identified. No suspicious calcifications are present. Evidence of previous abdominal surgery, bilateral hip replacements, right shoulder hemiarthroplasty and cervical spine surgery again noted. IMPRESSION: Nonspecific nonobstructive bowel gas pattern. No evidence of pneumoperitoneum. Cardiomegaly without evidence of acute cardiopulmonary disease. Electronically  Signed   By: Margarette Canada M.D.   On: 07/08/2016 12:17    EKG: not done  Assessment/Plan Principal Problem:   Constipation Active Problems:   Hypertension   Anxiety   Abdominal pain   Transaminitis   GIB (gastrointestinal bleeding)    Gastrointestinal bleeding - H/H q6h - clear liquid diet - IV PPI - vital signs stable at this time - will monitor for increased signs of bleeding  Constipation - does not sound as though patient is constipated - will monitor for bowel movements - no distention seen on abdominal xray  Transaminitis - repeat CMP in am - Hepatitis panel pending - drug screen - acetaminophen level <10  HTN - BP currently controlled  Anxiety - holding valium currently - can restart if BP increases and no further bloody stools     DVT prophylaxis: SCDs Code Status:  DNR Family Communication: no family bedside Disposition Plan: likely discharge back to previous home environment when stable  Consults called: none Admission status:  Observation, telemetry   Loretha Stapler MD [facility] Pager [phone]  If 7PM-7AM, please contact night-coverage [email or web address] Password [email or web address]  07/08/2016, 1:41 PM

## 2016-07-08 NOTE — ED Notes (Signed)
MD at bedside. 

## 2016-07-08 NOTE — ED Notes (Signed)
[place] called and notified about pt at this time.

## 2016-07-09 DIAGNOSIS — I10 Essential (primary) hypertension: Secondary | ICD-10-CM | POA: Diagnosis not present

## 2016-07-09 DIAGNOSIS — R740 Nonspecific elevation of levels of transaminase and lactic acid dehydrogenase [LDH]: Secondary | ICD-10-CM | POA: Diagnosis not present

## 2016-07-09 DIAGNOSIS — K5900 Constipation, unspecified: Secondary | ICD-10-CM | POA: Diagnosis not present

## 2016-07-09 DIAGNOSIS — K922 Gastrointestinal hemorrhage, unspecified: Secondary | ICD-10-CM | POA: Diagnosis not present

## 2016-07-09 DIAGNOSIS — R1030 Lower abdominal pain, unspecified: Secondary | ICD-10-CM

## 2016-07-09 LAB — COMPREHENSIVE METABOLIC PANEL
ALT: 249 U/L — ABNORMAL HIGH (ref 14–54)
AST: 249 U/L — ABNORMAL HIGH (ref 15–41)
Albumin: 3.1 g/dL — ABNORMAL LOW (ref 3.5–5.0)
Alkaline Phosphatase: 287 U/L — ABNORMAL HIGH (ref 38–126)
Anion gap: 5 (ref 5–15)
BUN: 15 mg/dL (ref 6–20)
CO2: 27 mmol/L (ref 22–32)
Calcium: 8.5 mg/dL — ABNORMAL LOW (ref 8.9–10.3)
Chloride: 106 mmol/L (ref 101–111)
Creatinine, Ser: 0.91 mg/dL (ref 0.44–1.00)
GFR calc Af Amer: >60 mL/min (ref >60)
GFR calc non Af Amer: >60 mL/min (ref >60)
Glucose, Bld: 111 mg/dL — ABNORMAL HIGH (ref 65–99)
Potassium: 3.5 mmol/L (ref 3.5–5.1)
Sodium: 138 mmol/L (ref 135–145)
Total Bilirubin: 1 mg/dL (ref 0.3–1.2)
Total Protein: 5.5 g/dL — ABNORMAL LOW (ref 6.5–8.1)

## 2016-07-09 LAB — HEMOGLOBIN AND HEMATOCRIT, BLOOD
HCT: 38.2 % (ref 36.0–46.0)
HCT: 42.6 % (ref 36.0–46.0)
Hemoglobin: 12.1 g/dL (ref 12.0–15.0)
Hemoglobin: 14 g/dL (ref 12.0–15.0)

## 2016-07-09 NOTE — Progress Notes (Signed)
Patient discharged with instructions given on medications,and follow up visits,patient verbalized understanding. No c/o pain or discomfort noted. Accompanied bt staff to an awaiting vehicle.

## 2016-07-09 NOTE — Discharge Summary (Signed)
Physician Discharge Summary  [name] [name] YTK:[id number] DOB: [birth year] DOA: 07/08/2016  PCP: [name], MD  Admit date: 07/08/2016 Discharge date: 07/09/2016  Admitted From: Home Disposition:  Home  Recommendations for Outpatient Follow-up:  1. Follow up with PCP in 1-2 weeks 2. Need follow up abdominal ultrasound in 3 years for abdominal aortic aneurysn 3. Please schedule a follow up with [facility] 4. Please obtain CMP/CBC in one week 5. Follow up on Hepatitis panel  Home Health:No   Equipment/Devices: None   Discharge Condition: stable  CODE STATUS: DNR   Diet recommendation: Heart Healthy    Brief/Interim Summary:  [name] [name] is a 76 y.o. female with medical history significant of anxiety, arthritis, depression, GERD, HTN, that presents with diarrhea for the past 3 weeks that turned to constipation.  She states that she goes between 1-3 days without a bowel movement however that did not happen this time- she had diarrhea that ultimately ceased and she then felt as though she became constipated.  She voices she gets this feeling frequently secondary to her gastric bypass that was years ago and that she feels food "gets stuff in her pouch".  She takes a stool softener nightly and if she doesn't have a bowel movement will take a laxative.  Last laxative was 3 days prior to admission.  She says she had three bowel movements yesterday and then thinks she had 1 bowel movement the day before yesterday.  She cannot explain when exactly she had constipation.  Patient then states that she felt as though the rice she had eaten got stuck in her pouch and so she used a suppository to help with her feeling of being blocked. She voices she has also been vomiting mostly water and bile.  Patient is a poor historian and provides history of workups for conditions years ago.    Patient was placed under observation and she was monitored with serial CBC's.  Her blood count on day of discharge was  14.0/42.6.  Vital signs remained stable and her LFT's were decreased from time of admission- at admission AST 740, ALT 429 and Alk Phos 352 and at discharge AST 249, ALT 249, Alk Phos 287.  She voiced understanding and stated she would follow up with her PCP as well as Dr. Oneida Alar for Gastroenterology outpatient. During hospital stay patient had two bowel movements both of which were non bloody.  Discharge Diagnoses:  Principal Problem:   Constipation Active Problems:   Hypertension   Anxiety   Abdominal pain   Transaminitis   GIB (gastrointestinal bleeding)    Discharge Instructions  Discharge Instructions    Call MD for:  difficulty breathing, headache or visual disturbances    Complete by:  As directed    Call MD for:  extreme fatigue    Complete by:  As directed    Call MD for:  hives    Complete by:  As directed    Call MD for:  persistant dizziness or light-headedness    Complete by:  As directed    Call MD for:  persistant nausea and vomiting    Complete by:  As directed    Call MD for:  severe uncontrolled pain    Complete by:  As directed    Call MD for:  temperature >100.4    Complete by:  As directed    Diet - low sodium heart healthy    Complete by:  As directed    Discharge instructions  Complete by:  As directed    Follow up with PCP Schedule follow up with Gastroenterology Get repeat CMP in 1 week   Increase activity slowly    Complete by:  As directed      Allergies as of 07/09/2016      Reactions   Novocain [procaine Hcl]    Unknown-passed out with novocaine injected for dental surgery.   Other Hives   EKG pads - need to use pediatric pads   Latex Rash   Penicillins Rash   Has patient had a PCN reaction causing immediate rash, facial/tongue/throat swelling, SOB or lightheadedness with hypotension: Yes Has patient had a PCN reaction causing severe rash involving mucus membranes or skin necrosis: No Has patient had a PCN reaction that required  hospitalization No Has patient had a PCN reaction occurring within the last 10 years: No  If all of the above answers are "NO", then may proceed with Cephalosporin use.   Sulfa Antibiotics Rash      Medication List    TAKE these medications   acetaminophen 325 MG tablet Commonly known as:  TYLENOL Take 650 mg by mouth every 6 (six) hours as needed for mild pain.   albuterol (2.5 MG/3ML) 0.083% nebulizer solution Commonly known as:  PROVENTIL Take 3 mLs (2.5 mg total) by nebulization every 4 (four) hours as needed for wheezing.   CITRACAL PETITES/VITAMIN D 200-250 MG-UNIT Tabs Generic drug:  Calcium Citrate-Vitamin D Take 1 tablet by mouth daily.   dextromethorphan-guaiFENesin 30-600 MG 12hr tablet Commonly known as:  MUCINEX DM Take 1 tablet by mouth 2 (two) times daily.   diazepam 5 MG tablet Commonly known as:  VALIUM Take 5 mg by mouth 3 (three) times daily as needed. For anxiety   docusate sodium 100 MG capsule Commonly known as:  COLACE Take 100 mg by mouth at bedtime.   guaifenesin 100 MG/5ML syrup Commonly known as:  ROBITUSSIN Take 200 mg by mouth 3 (three) times daily as needed for cough.   hydrocortisone-pramoxine rectal foam Commonly known as:  PROCTOFOAM HC Place 1 applicator rectally 2 (two) times daily.   iron polysaccharides 150 MG capsule Commonly known as:  NIFEREX Take 1 capsule (150 mg total) by mouth 2 (two) times daily. What changed:  how much to take  when to take this   lisinopril-hydrochlorothiazide 20-12.5 MG tablet Commonly known as:  PRINZIDE,ZESTORETIC 1 tablet daily. Reported on 08/03/2015   omeprazole 20 MG capsule Commonly known as:  PRILOSEC Take 20 mg by mouth 2 (two) times daily before a meal.   ondansetron 4 MG tablet Commonly known as:  ZOFRAN Take 4 mg by mouth every 8 (eight) hours as needed for nausea or vomiting.   potassium chloride 10 MEQ tablet Commonly known as:  K-DUR Take 10 mEq by mouth 3 (three) times daily  with meals.   sertraline 100 MG tablet Commonly known as:  ZOLOFT Take 100 mg by mouth daily.   traZODone 50 MG tablet Commonly known as:  DESYREL Take 50 mg by mouth at bedtime.   vitamin B-12 100 MCG tablet Commonly known as:  CYANOCOBALAMIN Take 500 mcg by mouth daily.      Follow-up Information    [facility]. Schedule an appointment as soon as possible for a visit in 3 week(s).   Contact information: [address] [place] [phone]       [name], MD. Schedule an appointment as soon as possible for a visit in  1 week(s).   Specialty:  Family Medicine Contact information: [address] [place]  [address] [phone]          Allergies  Allergen Reactions  . Novocain [Procaine Hcl]     Unknown-passed out with novocaine injected for dental surgery.  . Other Hives    EKG pads - need to use pediatric pads  . Latex Rash  . Penicillins Rash    Has patient had a PCN reaction causing immediate rash, facial/tongue/throat swelling, SOB or lightheadedness with hypotension: Yes Has patient had a PCN reaction causing severe rash involving mucus membranes or skin necrosis: No Has patient had a PCN reaction that required hospitalization No Has patient had a PCN reaction occurring within the last 10 years: No  If all of the above answers are "NO", then may proceed with Cephalosporin use.   . Sulfa Antibiotics Rash    Consultations:   None    Procedures/Studies: US Abdomen Complete  Result Date: 07/08/2016 CLINICAL DATA:  76 year old female with abdominal pain for 2 weeks and transaminitis. History of cholecystectomy. EXAM: ABDOMEN ULTRASOUND COMPLETE COMPARISON:  None. FINDINGS: Gallbladder: The gallbladder is not visualized compatible with cholecystectomy. Common bile duct: Diameter: 7 mm. There is no evidence of intrahepatic or extrahepatic biliary dilatation. Liver: Diffuse increased echogenicity noted. No  definite focal hepatic abnormalities noted. IVC: No abnormality visualized. Pancreas: Not well visualized. Spleen: Upper limits of normal in size.  No focal abnormalities. Right Kidney: Length: 11.6 cm. Cortical atrophy noted. No mass or hydronephrosis identified. Left Kidney: Length: 11.5 cm. Cortical atrophy noted. No mass or hydronephrosis identified. Abdominal aorta: The mid abdominal aorta measures 3.1 cm in greatest diameter. Other findings: None IMPRESSION: Diffusely increased hepatic echogenicity likely representing hepatic steatosis. No focal hepatic abnormalities or biliary dilatation noted. 3.1 cm abdominal aortic aneurysm. Recommend followup by ultrasound in 3 years. This recommendation follows ACR consensus guidelines: White Paper of the ACR Incidental Findings Committee II on Vascular Findings. J Am Coll Radiol 2013; 10:789-794 Bilateral renal cortical atrophy. Electronically Signed   By: Margarette Canada M.D.   On: 07/08/2016 13:54   Dg Abd Acute W/chest  Result Date: 07/08/2016 CLINICAL DATA:  Abdominal distention and vomiting for 1 day. EXAM: DG ABDOMEN ACUTE W/ 1V CHEST COMPARISON:  11/21/2015 radiographs and prior studies FINDINGS: Cardiomegaly and mild interstitial prominence again noted. No airspace disease, consolidation, pleural effusion or pneumothorax noted. Nondistended gas-filled loops of small bowel are identified within the abdomen. Gas and stool within the colon noted. No dilated bowel loops or pneumoperitoneum identified. No suspicious calcifications are present. Evidence of previous abdominal surgery, bilateral hip replacements, right shoulder hemiarthroplasty and cervical spine surgery again noted. IMPRESSION: Nonspecific nonobstructive bowel gas pattern. No evidence of pneumoperitoneum. Cardiomegaly without evidence of acute cardiopulmonary disease. Electronically Signed   By: Margarette Canada M.D.   On: 07/08/2016 12:17       Subjective: Patient says she is doing well.  Says she  feels well and understands the plan for follow up.  Discharge Exam: Vitals:   07/08/16 2145 07/09/16 0600  BP: (!) 146/67 129/76  Pulse: (!) 56 (!) 50  Resp: 18 18  Temp: 98.3 F (36.8 C) 98.1 F (36.7 C)   Vitals:   07/08/16 1745 07/08/16 2041 07/08/16 2145 07/09/16 0600  BP: (!) 136/95  (!) 146/67 129/76  Pulse: (!) 54  (!) 56 (!) 50  Resp: _0 Temp: 99.6 F (37.6 C)  98.3 F (36.8 C) 98.1 F (  36.7 C)  TempSrc: Oral  Oral Oral  SpO2: 99% 96% 98% 97%  Weight: 103.2 kg (227 lb 8 oz)     Height:        General: Pt is alert, awake, not in acute distress Cardiovascular: RRR, S1/S2 +, no rubs, no gallops Respiratory: CTA bilaterally, no wheezing, no rhonchi Abdominal: Obese, Soft, NT, ND, bowel sounds + Extremities: no edema, no cyanosis    The results of significant diagnostics from this hospitalization (including imaging, microbiology, ancillary and laboratory) are listed below for reference.     Microbiology: No results found for this or any previous visit (from the past 240 hour(s)).   Labs: BNP (last 3 results) No results for input(s): BNP in the last 8760 hours. Basic Metabolic Panel:  Recent Labs Lab 07/08/16 1101 07/09/16 0247  NA 138 138  K 3.9 3.5  CL 104 106  CO2 28 27  GLUCOSE 126* 111*  BUN 22* 15  CREATININE 1.09* 0.91  CALCIUM 8.7* 8.5*   Liver Function Tests:  Recent Labs Lab 07/08/16 1101 07/09/16 0247  AST 740* 249*  ALT 429* 249*  ALKPHOS 352* 287*  BILITOT 1.7* 1.0  PROT 6.2* 5.5*  ALBUMIN 3.6 3.1*    Recent Labs Lab 07/08/16 1100  LIPASE 17   No results for input(s): AMMONIA in the last 168 hours. CBC:  Recent Labs Lab 07/08/16 1106 07/08/16 1532 07/08/16 2127 07/09/16 0247 07/09/16 0924  WBC 6.0  --   --   --   --   NEUTROABS 4.8  --   --   --   --   HGB 13.2 13.3 11.5* 12.1 14.0  HCT 39.9 40.0 36.0 38.2 42.6  MCV 95.0  --   --   --   --   PLT 131*  --   --   --   --    Cardiac Enzymes: No results  for input(s): CKTOTAL, CKMB, CKMBINDEX, TROPONINI in the last 168 hours. BNP: Invalid input(s): POCBNP CBG: No results for input(s): GLUCAP in the last 168 hours. D-Dimer No results for input(s): DDIMER in the last 72 hours. Hgb A1c No results for input(s): HGBA1C in the last 72 hours. Lipid Profile No results for input(s): CHOL, HDL, LDLCALC, TRIG, CHOLHDL, LDLDIRECT in the last 72 hours. Thyroid function studies No results for input(s): TSH, T4TOTAL, T3FREE, THYROIDAB in the last 72 hours.  Invalid input(s): FREET3 Anemia work up No results for input(s): VITAMINB12, FOLATE, FERRITIN, TIBC, IRON, RETICCTPCT in the last 72 hours. Urinalysis    Component Value Date/Time   COLORURINE AMBER (A) 07/08/2016 1114   APPEARANCEUR CLEAR 07/08/2016 1114   LABSPEC 1.017 07/08/2016 1114   PHURINE 7.0 07/08/2016 1114   GLUCOSEU NEGATIVE 07/08/2016 1114   HGBUR NEGATIVE 07/08/2016 1114   BILIRUBINUR NEGATIVE 07/08/2016 1114   KETONESUR NEGATIVE 07/08/2016 1114   PROTEINUR NEGATIVE 07/08/2016 1114   UROBILINOGEN 0.2 12/28/2013 1310   NITRITE NEGATIVE 07/08/2016 1114   LEUKOCYTESUR NEGATIVE 07/08/2016 1114   Sepsis Labs Invalid input(s): PROCALCITONIN,  WBC,  LACTICIDVEN Microbiology No results found for this or any previous visit (from the past 240 hour(s)).   Time coordinating discharge: Over 30 minutes  SIGNED:   Loretha Stapler, MD  [facility] 07/09/2016, 11:30 AM Pager [phone] If 7PM-7AM, please contact night-coverage [email or web address] Password [email or web address]

## 2016-07-09 NOTE — Care Management Note (Signed)
Case Management Note  Patient Details  Name: [name] [name] MRN: [id number] Date of Birth: 1940/12/21  Subjective/Objective:                  Pt admitted for r/o GIB. Chart reviewed for CM needs. He is from home, lives alone and is ind with ADL's. He has PCP, transportation and insurance with drug coverage. He plans to return home with self care.  Action/Plan: Pt discharging home today with self care.   Expected Discharge Date:  07/09/16               Expected Discharge Plan:  Home/Self Care  In-House Referral:  NA  Discharge planning Services  NA  Post Acute Care Choice:  NA Choice offered to:  NA  Status of Service:  Completed, signed off  Sherald Barge, RN 07/09/2016, 11:37 AM

## 2016-07-09 NOTE — Discharge Instructions (Signed)
Gastrointestinal Bleeding °Gastrointestinal bleeding is bleeding somewhere along the path food travels through the body (digestive tract). This path is anywhere between the mouth and the opening of the butt (anus). You may have blood in your poop (stools) or have black poop. If you throw up (vomit), there may be blood in it. °This condition can be mild, serious, or even life-threatening. If you have a lot of bleeding, you may need to stay in the hospital. °Follow these instructions at home: °· Take over-the-counter and prescription medicines only as told by your doctor. °· Eat foods that have a lot of fiber in them. These foods include whole grains, fruits, and vegetables. You can also try eating 1-3 prunes each day. °· Drink enough fluid to keep your pee (urine) clear or pale yellow. °· Keep all follow-up visits as told by your doctor. This is important. °Contact a doctor if: °· Your symptoms do not get better. °Get help right away if: °· Your bleeding gets worse. °· You feel dizzy or you pass out (faint). °· You feel weak. °· You have very bad cramps in your back or belly (abdomen). °· You pass large clumps of blood (clots) in your poop. °· Your symptoms are getting worse. °This information is not intended to replace advice given to you by your health care provider. Make sure you discuss any questions you have with your health care provider. °Document Released: 11/29/2007 Document Revised: 07/28/2015 Document Reviewed: 08/09/2014 °Elsevier Interactive Patient Education © 2017 [employer] ° °

## 2016-07-09 NOTE — Care Management Obs Status (Signed)
**Note [name]-Identified via Obfuscation** [place] NOTIFICATION   Patient Details  Name: [name] MRN: [id number] Date of Birth: 07/22/1940   Medicare Observation Status Notification Given:  Other (see comment) (pt discharged <24hrs)    Sherald Barge, RN 07/09/2016, 11:36 AM

## 2016-07-10 LAB — HEPATITIS PANEL, ACUTE
HCV Ab: <0.1 s/co ratio (ref 0.0–0.9)
Hep A IgM: NEGATIVE
Hep B C IgM: NEGATIVE
Hepatitis B Surface Ag: NEGATIVE

## 2016-07-12 ENCOUNTER — Emergency Department (HOSPITAL_COMMUNITY)
Admission: EM | Admit: 2016-07-12 | Discharge: 2016-07-12 | Disposition: A | Discharge status: Home / Self Care | Payer: Medicare Other | Attending: Emergency Medicine | Admitting: Emergency Medicine

## 2016-07-12 ENCOUNTER — Emergency Department (HOSPITAL_COMMUNITY): Payer: Medicare Other

## 2016-07-12 DIAGNOSIS — Z91040 Latex allergy status: Secondary | ICD-10-CM | POA: Insufficient documentation

## 2016-07-12 DIAGNOSIS — J181 Lobar pneumonia, unspecified organism: Secondary | ICD-10-CM | POA: Diagnosis not present

## 2016-07-12 DIAGNOSIS — Z79899 Other long term (current) drug therapy: Secondary | ICD-10-CM | POA: Insufficient documentation

## 2016-07-12 DIAGNOSIS — R05 Cough: Secondary | ICD-10-CM | POA: Diagnosis present

## 2016-07-12 DIAGNOSIS — J189 Pneumonia, unspecified organism: Secondary | ICD-10-CM

## 2016-07-12 DIAGNOSIS — I10 Essential (primary) hypertension: Secondary | ICD-10-CM | POA: Diagnosis not present

## 2016-07-12 MED ORDER — AZITHROMYCIN 250 MG PO TABS
500.0000 mg | ORAL_TABLET | Freq: Once | ORAL | Status: AC
  Administered 2016-07-12: 500 mg via ORAL
  Filled 2016-07-12: qty 2

## 2016-07-12 MED ORDER — AZITHROMYCIN 250 MG PO TABS: ORAL_TABLET | ORAL | 0 refills | Status: DC

## 2016-07-12 MED ORDER — LIDOCAINE HCL (PF) 1 % IJ SOLN
INTRAMUSCULAR | Status: AC
  Filled 2016-07-12: qty 5

## 2016-07-12 MED ORDER — CEFTRIAXONE SODIUM 1 G IJ SOLR
1.0000 g | Freq: Once | INTRAMUSCULAR | Status: AC
  Administered 2016-07-12: 1 g via INTRAMUSCULAR
  Filled 2016-07-12: qty 10

## 2016-07-12 NOTE — ED Notes (Signed)
Pt concerned she has pneumonia

## 2016-07-12 NOTE — ED Provider Notes (Signed)
[facility] DEPT Provider Note   CSN: [id number] Arrival date & time: 07/12/16  1225     History   Chief Complaint Chief Complaint  Patient presents with  . Cough    HPI [name] [name] is a 76 y.o. female.  Pt presents to the ED today with a cough.  She woke up around 0100 with a cough that had some blood tinged sputum.  She has had pna in the past and this is what it felt like in the past.  The pt said that her cough has improved.  Pt denies any fever.      Past Medical History:  Diagnosis Date  . Anemia   . Anxiety   . Arthritis   . Depression   . GERD (gastroesophageal reflux disease)   . H/O hiatal hernia   . Hypertension   . Obesity   . Pneumonia   . PONV (postoperative nausea and vomiting)   . Seasonal allergies   . Sepsis ([place]) 10/02/2013    Patient Active Problem List   Diagnosis Date Noted  . Transaminitis 07/08/2016  . Constipation 07/08/2016  . GIB (gastrointestinal bleeding) 07/08/2016  . HCAP (healthcare-associated pneumonia) 09/23/2015  . AKI (acute kidney injury) ([place]) 07/21/2015  . Hypokalemia 07/21/2015  . CAP (community acquired pneumonia) 07/14/2015  . Abdominal pain 06/10/2014  . Folliculitis of perineum 02/24/2014  . Giant comedone 12/21/2013  . 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 Acrochordon 11/25/2013  . Dysphagia 10/29/2013  . Morbid obesity ([place]) 10/02/2013  . Hypertension   . GERD (gastroesophageal reflux disease)   . Anxiety   . Anemia of chronic disease   . 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 ABDOMINAL HYSTERECTOMY    . APPENDECTOMY    . BACK SURGERY     lumbar x2 by Dr Arnoldo Morale  . CARPAL TUNNEL RELEASE Right 03/2014   Dr. Lorin Mercy  . CATARACT EXTRACTION W/PHACO Left 08/06/2013   Procedure: CATARACT EXTRACTION PHACO AND INTRAOCULAR LENS PLACEMENT LEFT EYE;  Surgeon: Tonny Branch, MD;  Location: [facility];  Service: Ophthalmology;  Laterality:  Left;  CDE: 9.55  . CATARACT EXTRACTION W/PHACO Right 08/24/2013   Procedure: CATARACT EXTRACTION PHACO AND INTRAOCULAR LENS PLACEMENT (IOC);  Surgeon: Tonny Branch, MD;  Location: [facility];  Service: Ophthalmology;  Laterality: Right;  CDE:8.30  . CERVICAL FUSION    . CHOLECYSTECTOMY    . COLONOSCOPY N/A 11/03/2013   SLF: 1. Normal mucosa in the terminal ileum. 2. 13 colon polyps removed. 3. Moderate diverticulosis in the descending colon and sigmoid colon 4. the left colon is redundant 5. Small internal  hemorrhoids.  . ESOPHAGOGASTRODUODENOSCOPY N/A 11/03/2013   SLF: 1. 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 Mild Non-erosive gastritis  . EYE SURGERY    . GASTRIC BYPASS  1979   revision in 1980  . HIATAL HERNIA REPAIR     [facility] in 2011  . JOINT REPLACEMENT Right 1995   hip  . JOINT REPLACEMENT Left 2008   hip  . MEDIAL PARTIAL KNEE REPLACEMENT Left    Dr Ronnie Derby  . PARAESOPHAGEAL HERNIA REPAIR  SEP 2010 DR. MCNATT  . SHOULDER ARTHROSCOPY Right   . SHOULDER ARTHROSCOPY Right    x2  . SKIN BIOPSY    . TONSILLECTOMY    . TOTAL SHOULDER ARTHROPLASTY Right 02/16/2013   Procedure: TOTAL SHOULDER ARTHROPLASTY;  Surgeon: Elta Guadeloupe  Jule Economy, MD;  Location: [facility];  Service: Orthopedics;  Laterality: Right;  Right Total Shoulder Arthroplasty, Cemented    OB History    No data available       Home Medications    Prior to Admission medications   Medication Sig Start Date End Date Taking? Authorizing Provider  acetaminophen (TYLENOL) 325 MG tablet Take 650 mg by mouth every 6 (six) hours as needed for mild pain.    [provider]  albuterol (PROVENTIL) (2.5 MG/3ML) 0.083% nebulizer solution Take 3 mLs (2.5 mg total) by nebulization every 4 (four) hours as needed for wheezing. 07/21/15   Kathie Dike, MD  azithromycin (ZITHROMAX) 250 MG tablet Take 1 every day until finished. 07/12/16   Isla Pence, MD  Calcium Citrate-Vitamin D  (CITRACAL PETITES/VITAMIN D) 200-250 MG-UNIT TABS Take 1 tablet by mouth daily.    [provider]  dextromethorphan-guaiFENesin (MUCINEX DM) 30-600 MG 12hr tablet Take 1 tablet by mouth 2 (two) times daily.     [provider]  diazepam (VALIUM) 5 MG tablet Take 5 mg by mouth 3 (three) times daily as needed. For anxiety    [provider]  docusate sodium (COLACE) 100 MG capsule Take 100 mg by mouth at bedtime.     [provider]  guaifenesin (ROBITUSSIN) 100 MG/5ML syrup Take 200 mg by mouth 3 (three) times daily as needed for cough.    [provider]  hydrocortisone-pramoxine (PROCTOFOAM HC) rectal foam Place 1 applicator rectally 2 (two) times daily. 02/15/16   Setzer, Rona Ravens, NP  iron polysaccharides (NIFEREX) 150 MG capsule Take 1 capsule (150 mg total) by mouth 2 (two) times daily. Patient taking differently: Take 325 mg by mouth daily.  10/03/13   Kathie Dike, MD  lisinopril-hydrochlorothiazide (PRINZIDE,ZESTORETIC) 20-12.5 MG tablet 1 tablet daily. Reported on 08/03/2015 06/16/15   [provider]  omeprazole (PRILOSEC) 20 MG capsule Take 20 mg by mouth 2 (two) times daily before a meal.    [provider]  ondansetron (ZOFRAN) 4 MG tablet Take 4 mg by mouth every 8 (eight) hours as needed for nausea or vomiting.    [provider]  potassium chloride (K-DUR) 10 MEQ tablet Take 10 mEq by mouth 3 (three) times daily with meals.     [provider]  sertraline (ZOLOFT) 100 MG tablet Take 100 mg by mouth daily.    [provider]  traZODone (DESYREL) 50 MG tablet Take 50 mg by mouth at bedtime.    [provider]  vitamin B-12 (CYANOCOBALAMIN) 100 MCG tablet Take 500 mcg by mouth daily.     [provider]    Family History Family History  Problem Relation Age of Onset  . Colon cancer Brother        IN HIS 60s-METASTATIC  . Hypertension Brother   . Colon polyps Paternal  Grandfather   . Breast cancer Mother   . Hypertension Father     Social History Social History  Substance Use Topics  . Smoking status: Never Smoker  . Smokeless tobacco: Never Used  . Alcohol use No     Allergies   Novocain [procaine hcl]; Other; Latex; Penicillins; and Sulfa antibiotics   Review of Systems Review of Systems  Respiratory: Positive for cough.   All other systems reviewed and are negative.    Physical Exam Updated Vital Signs BP (!) 128/58   Pulse 71   Temp 98.1 F (36.7 C) (Oral)   Resp 15  Ht 5\' 3"  (1.6 m)   Wt 227 lb (103 kg)   SpO2 97%   BMI 40.21 kg/m   Physical Exam  Constitutional: She is oriented to person, place, and time. She appears well-developed and well-nourished.  HENT:  Head: Normocephalic and atraumatic.  Right Ear: External ear normal.  Left Ear: External ear normal.  Nose: Nose normal.  Mouth/Throat: Oropharynx is clear and moist.  Eyes: Conjunctivae and EOM are normal. Pupils are equal, round, and reactive to light.  Neck: Normal range of motion. Neck supple.  Cardiovascular: Normal rate, regular rhythm, normal heart sounds and intact distal pulses.   Pulmonary/Chest: Effort normal and breath sounds normal.  Abdominal: Soft. Bowel sounds are normal.  Musculoskeletal: Normal range of motion.  Neurological: She is alert and oriented to person, place, and time.  Skin: Skin is warm and dry.  Psychiatric: She has a normal mood and affect. Her behavior is normal. Judgment and thought content normal.  Nursing note and vitals reviewed.    ED Treatments / Results  Labs (all labs ordered are listed, but only abnormal results are displayed) Labs Reviewed - No data to display  EKG  EKG Interpretation None       Radiology Dg Chest 2 View  Result Date: 07/12/2016 CLINICAL DATA:  Cough with hemoptysis.  Low grade fever. EXAM: CHEST  2 VIEW COMPARISON:  07/08/2016.  11/21/2015. FINDINGS: Heart size is normal. There is  chronic aortic atherosclerosis. There are patchy densities in the mid lower lungs bilaterally that could represent atelectasis or mild patchy pneumonia. No dense consolidation or lobar collapse. No effusions. 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 IMPRESSION: Patchy densities in the mid and lower lungs bilaterally which could be atelectasis or mild patchy bronchopneumonia. Electronically Signed   By: Nelson Chimes M.D.   On: 07/12/2016 13:02    Procedures Procedures (including critical care time)  Medications Ordered in ED Medications  lidocaine (PF) (XYLOCAINE) 1 % injection (not administered)  cefTRIAXone (ROCEPHIN) injection 1 g (1 g Intramuscular Given 07/12/16 1324)  azithromycin (ZITHROMAX) tablet 500 mg (500 mg Oral Given 07/12/16 1324)     Initial Impression / Assessment and Plan / ED Course  I have reviewed the triage vital signs and the nursing notes.  Pertinent labs & imaging results that were available during my care of the patient were reviewed by me and considered in my medical decision making (see chart for details).    As sx c/w prior with pna, pt will be given rocephin and zithromax.  She looks good and is stable for d/c.  She knows to return if worse.  Final Clinical Impressions(s) / ED Diagnoses   Final diagnoses:  Community acquired pneumonia of left lower lobe of lung ([place])  Community acquired pneumonia of right lower lobe of lung ([place])    New Prescriptions New Prescriptions   AZITHROMYCIN (ZITHROMAX) 250 MG TABLET    Take 1 every day until finished.     Isla Pence, MD 07/12/16 1331

## 2016-07-12 NOTE — ED Notes (Signed)
Pt called family for a ride home. Pt given a lunch meal tray

## 2016-07-12 NOTE — ED Triage Notes (Signed)
Returns to ED today, here on Sunday and admitted discharged Monday for constipation, Cough with blood tinge sputum. Taken Albuterol treatment today. Low grade temp.

## 2016-07-12 NOTE — ED Notes (Signed)
Patient given discharge instruction, verbalized understand. Patient ambulatory out of the department.  

## 2016-07-12 NOTE — ED Notes (Signed)
Pt up to wheelchair to bathroom , pt will wait in waiting room for her family to arrive to take her home.

## 2016-07-18 DIAGNOSIS — D485 Neoplasm of uncertain behavior of skin: Secondary | ICD-10-CM | POA: Diagnosis not present

## 2016-07-18 DIAGNOSIS — Z85828 Personal history of other malignant neoplasm of skin: Secondary | ICD-10-CM | POA: Diagnosis not present

## 2016-07-18 DIAGNOSIS — L309 Dermatitis, unspecified: Secondary | ICD-10-CM | POA: Diagnosis not present

## 2016-08-15 ENCOUNTER — Encounter (INDEPENDENT_AMBULATORY_CARE_PROVIDER_SITE_OTHER): Payer: Self-pay | Admitting: Internal Medicine

## 2016-08-15 ENCOUNTER — Ambulatory Visit (INDEPENDENT_AMBULATORY_CARE_PROVIDER_SITE_OTHER): Payer: Medicare Other | Admitting: Internal Medicine

## 2016-08-15 VITALS — BP 150/90 | HR 60 | Temp 98.1°F | Ht 63.0 in | Wt 229.0 lb

## 2016-08-15 DIAGNOSIS — R748 Abnormal levels of other serum enzymes: Secondary | ICD-10-CM

## 2016-08-15 LAB — COMPREHENSIVE METABOLIC PANEL
ALT: 131 U/L — ABNORMAL HIGH (ref 6–29)
AST: 130 U/L — ABNORMAL HIGH (ref 10–35)
Albumin: 4 g/dL (ref 3.6–5.1)
Alkaline Phosphatase: 363 U/L — ABNORMAL HIGH (ref 33–130)
BUN: 19 mg/dL (ref 7–25)
CO2: 27 mmol/L (ref 20–31)
Calcium: 8.7 mg/dL (ref 8.6–10.4)
Chloride: 105 mmol/L (ref 98–110)
Creat: 1.05 mg/dL — ABNORMAL HIGH (ref 0.60–0.93)
Glucose, Bld: 103 mg/dL — ABNORMAL HIGH (ref 65–99)
Potassium: 4.4 mmol/L (ref 3.5–5.3)
Sodium: 141 mmol/L (ref 135–146)
Total Bilirubin: 0.5 mg/dL (ref 0.2–1.2)
Total Protein: 6 g/dL — ABNORMAL LOW (ref 6.1–8.1)

## 2016-08-15 NOTE — Patient Instructions (Signed)
CMET.  OV in 1 year

## 2016-08-15 NOTE — Progress Notes (Signed)
Subjective:    Patient ID: [name] [name], female    DOB: 09-Apr-1940, 76 y.o.   MRN: [id number]  HPI  Here today for f/u. Last seen in December 2017 for rectal pain.   Seen in the ED in May for diarrhea. Noted liver enzymes were elevated.(grossly). She also had upper abdominal pain.  The next day her pain was much better. She describes as tenderness.   Her liver enzymes were elevated on admission. AST 740, ALT 429, ALP 352.  Repeat next day  AST 249, ALT 249, ALP 352. She was having upper abdominal pain.  She was admitted overnight. Discharge after noting her liver enzymes were coming down.  She does not have any GI complaints today.   She does feel better. No abdominal pain.  His appetite is good. No weight loss.  Having a BM daily if she takes a stool softener.  Cholecystectomy about 15 yrs ago.    CMP Latest Ref Rng & Units 07/09/2016 07/08/2016 01/02/2016  Glucose 65 - 99 mg/dL 111(H) 126(H) -  BUN 6 - 20 mg/dL 15 22(H) -  Creatinine 0.44 - 1.00 mg/dL 0.91 1.09(H) 1.20(H)  Sodium 135 - 145 mmol/L 138 138 -  Potassium 3.5 - 5.1 mmol/L 3.5 3.9 -  Chloride 101 - 111 mmol/L 106 104 -  CO2 22 - 32 mmol/L 27 28 -  Calcium 8.9 - 10.3 mg/dL 8.5(L) 8.7(L) -  Total Protein 6.5 - 8.1 g/dL 5.5(L) 6.2(L) -  Total Bilirubin 0.3 - 1.2 mg/dL 1.0 1.7(H) -  Alkaline Phos 38 - 126 U/L 287(H) 352(H) -  AST 15 - 41 U/L 249(H) 740(H) -  ALT 14 - 54 U/L 249(H) 429(H) -       07/12/2016 US abdomen:   IMPRESSION: Diffusely increased hepatic echogenicity likely representing hepatic steatosis. No focal hepatic abnormalities or biliary dilatation Noted. CBC 36mm.  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   Colonoscopy/EGD was in 2015 with polyps removed: Dr. Oneida Alar  EGD: Dysphagia most likely due to sliding gastric pounch and non-adherence to gastric by[ass diet. Mild non erosive gastritis.  Stomach biopsy: Gastric body type mucosa with associated mild chronic  inflammation and foveolar hyperplasia. No evidence of H pylori, intestinal metaplasia, dysplasia or malignancy.  Colonoscopy: Normal mucosa in the terminal ileum. 13 colon polyps removed. Moderate diverticulosis of the descending colon and sigmoid colon. The left colon is redundant. Small internal hemorrhoids.  Biopsy: Tubular adenoma colon, ascending: sessile serrated polyp/adenoma. Descending, sigmoid, rectal: Tubular adenoma. Hyperplastic polyps and polypoid fragments of benign colonic mucosa.   She has a hx of gastric bypass in 1979 by Dr. Lindalou Hose.   Review of Systems Past Medical History:  Diagnosis Date  . Anemia   . Anxiety   . Arthritis   . Depression   . GERD (gastroesophageal reflux disease)   . H/O hiatal hernia   . Hypertension   . Obesity   . Pneumonia   . PONV (postoperative nausea and vomiting)   . Seasonal allergies   . Sepsis ([place]) 10/02/2013    Past Surgical History:  Procedure Laterality Date  . ABDOMINAL HYSTERECTOMY    . APPENDECTOMY    . BACK SURGERY     lumbar x2 by Dr Arnoldo Morale  . CARPAL TUNNEL RELEASE Right 03/2014   Dr. Lorin Mercy  . CATARACT EXTRACTION W/PHACO Left 08/06/2013   Procedure: CATARACT EXTRACTION PHACO AND INTRAOCULAR LENS PLACEMENT LEFT EYE;  Surgeon: Tonny Branch, MD;  Location: [facility];  Service: Ophthalmology;  Laterality: Left;  CDE: 9.55  . CATARACT EXTRACTION W/PHACO Right 08/24/2013   Procedure: CATARACT EXTRACTION PHACO AND INTRAOCULAR LENS PLACEMENT (IOC);  Surgeon: Tonny Branch, MD;  Location: [facility];  Service: Ophthalmology;  Laterality: Right;  CDE:8.30  . CERVICAL FUSION    . CHOLECYSTECTOMY    . COLONOSCOPY N/A 11/03/2013   SLF: 1. Normal mucosa in the terminal ileum. 2. 13 colon polyps removed. 3. Moderate diverticulosis in the descending colon and sigmoid colon 4. the left colon is redundant 5. Small internal  hemorrhoids.  . ESOPHAGOGASTRODUODENOSCOPY N/A 11/03/2013   SLF: 1. Dysphagia most likely due to sliding gastic pouch and non-  adherence to gastric bypass diet 2. Mild Non-erosive gastritis  . EYE SURGERY    . GASTRIC BYPASS  1979   revision in 1980  . HIATAL HERNIA REPAIR     [facility] in 2011  . JOINT REPLACEMENT Right 1995   hip  . JOINT REPLACEMENT Left 2008   hip  . MEDIAL PARTIAL KNEE REPLACEMENT Left    Dr Ronnie Derby  . PARAESOPHAGEAL HERNIA REPAIR  SEP 2010 DR. MCNATT  . SHOULDER ARTHROSCOPY Right   . SHOULDER ARTHROSCOPY Right    x2  . SKIN BIOPSY    . TONSILLECTOMY    . TOTAL SHOULDER ARTHROPLASTY Right 02/16/2013   Procedure: TOTAL SHOULDER ARTHROPLASTY;  Surgeon: Marybelle Killings, MD;  Location: [facility];  Service: Orthopedics;  Laterality: Right;  Right Total Shoulder Arthroplasty, Cemented    Allergies  Allergen Reactions  . Novocain [Procaine Hcl]     Unknown-passed out with novocaine injected for dental surgery.  . Other Hives    EKG pads - need to use pediatric pads  . Latex Rash  . Penicillins Rash    Has patient had a PCN reaction causing immediate rash, facial/tongue/throat swelling, SOB or lightheadedness with hypotension: Yes Has patient had a PCN reaction causing severe rash involving mucus membranes or skin necrosis: No Has patient had a PCN reaction that required hospitalization No Has patient had a PCN reaction occurring within the last 10 years: No  If all of the above answers are "NO", then may proceed with Cephalosporin use.   . Sulfa Antibiotics Rash    Current Outpatient Prescriptions on File Prior to Visit  Medication Sig Dispense Refill  . acetaminophen (TYLENOL) 325 MG tablet Take 650 mg by mouth every 6 (six) hours as needed for mild pain.    Marland Kitchen albuterol (PROVENTIL) (2.5 MG/3ML) 0.083% nebulizer solution Take 3 mLs (2.5 mg total) by nebulization every 4 (four) hours as needed for wheezing. 75 mL 12  . Calcium Citrate-Vitamin D (CITRACAL PETITES/VITAMIN D) 200-250 MG-UNIT TABS Take 1 tablet by mouth daily.    Marland Kitchen dextromethorphan-guaiFENesin (MUCINEX DM) 30-600 MG 12hr tablet  Take 1 tablet by mouth 2 (two) times daily.     . diazepam (VALIUM) 5 MG tablet Take 5 mg by mouth 3 (three) times daily as needed. For anxiety    . docusate sodium (COLACE) 100 MG capsule Take 100 mg by mouth at bedtime.     Marland Kitchen guaifenesin (ROBITUSSIN) 100 MG/5ML syrup Take 200 mg by mouth 3 (three) times daily as needed for cough.    . hydrocortisone-pramoxine (PROCTOFOAM HC) rectal foam Place 1 applicator rectally 2 (two) times daily. 10 g 1  . iron polysaccharides (NIFEREX) 150 MG capsule Take 1 capsule (150 mg total) by mouth 2 (two) times daily. (Patient taking differently: Take 325 mg by mouth daily. ) 60 capsule 1  .  lisinopril-hydrochlorothiazide (PRINZIDE,ZESTORETIC) 20-12.5 MG tablet 1 tablet daily. Reported on 08/03/2015    . omeprazole (PRILOSEC) 20 MG capsule Take 20 mg by mouth 2 (two) times daily before a meal.    . ondansetron (ZOFRAN) 4 MG tablet Take 4 mg by mouth every 8 (eight) hours as needed for nausea or vomiting.    . potassium chloride (K-DUR) 10 MEQ tablet Take 10 mEq by mouth 3 (three) times daily with meals.     . sertraline (ZOLOFT) 100 MG tablet Take 100 mg by mouth daily.    . traZODone (DESYREL) 50 MG tablet Take 50 mg by mouth at bedtime.    . vitamin B-12 (CYANOCOBALAMIN) 100 MCG tablet Take 500 mcg by mouth daily.      No current facility-administered medications on file prior to visit.         Objective:   Physical Exam Blood pressure (!) 150/90, pulse 60, temperature 98.1 F (36.7 C), height 5\' 3"  (1.6 m), weight 229 lb (103.9 kg).  Alert and oriented. Skin warm and dry. Oral mucosa is moist.   . Sclera anicteric, conjunctivae is pink. Thyroid not enlarged. No cervical lymphadenopathy. Lungs clear. Heart regular rate and rhythm.  Abdomen is soft. Bowel sounds are positive. No hepatomegaly. No abdominal masses felt. No tenderness.  No edema to lower extremities. Patient is alert and oriented.        Assessment & Plan:  Elevated liver enzymes.She  possible passed a CBD stone. Am going to repeat a CMET to be sure they have come down. OV in 1 year.

## 2016-08-20 ENCOUNTER — Telehealth (INDEPENDENT_AMBULATORY_CARE_PROVIDER_SITE_OTHER): Payer: Self-pay | Admitting: Internal Medicine

## 2016-08-20 ENCOUNTER — Other Ambulatory Visit (INDEPENDENT_AMBULATORY_CARE_PROVIDER_SITE_OTHER): Payer: Self-pay | Admitting: *Deleted

## 2016-08-20 DIAGNOSIS — R740 Nonspecific elevation of levels of transaminase and lactic acid dehydrogenase [LDH]: Principal | ICD-10-CM

## 2016-08-20 DIAGNOSIS — R7401 Elevation of levels of liver transaminase levels: Secondary | ICD-10-CM

## 2016-08-20 NOTE — Telephone Encounter (Signed)
Hepatic Function has been noted for 2 weeks and the patient will be sent a letter as a reminder.

## 2016-08-20 NOTE — Telephone Encounter (Signed)
Tammy, Hepatic in 2 weeks.

## 2016-08-21 ENCOUNTER — Other Ambulatory Visit (INDEPENDENT_AMBULATORY_CARE_PROVIDER_SITE_OTHER): Payer: Self-pay | Admitting: *Deleted

## 2016-08-21 DIAGNOSIS — R740 Nonspecific elevation of levels of transaminase and lactic acid dehydrogenase [LDH]: Principal | ICD-10-CM

## 2016-08-21 DIAGNOSIS — R7401 Elevation of levels of liver transaminase levels: Secondary | ICD-10-CM

## 2016-08-21 NOTE — Telephone Encounter (Signed)
err

## 2016-08-27 ENCOUNTER — Telehealth (INDEPENDENT_AMBULATORY_CARE_PROVIDER_SITE_OTHER): Payer: Self-pay | Admitting: Internal Medicine

## 2016-08-27 ENCOUNTER — Other Ambulatory Visit (INDEPENDENT_AMBULATORY_CARE_PROVIDER_SITE_OTHER): Payer: Self-pay | Admitting: *Deleted

## 2016-08-27 DIAGNOSIS — E876 Hypokalemia: Secondary | ICD-10-CM

## 2016-08-27 DIAGNOSIS — R1084 Generalized abdominal pain: Secondary | ICD-10-CM

## 2016-08-27 DIAGNOSIS — R740 Nonspecific elevation of levels of transaminase and lactic acid dehydrogenase [LDH]: Secondary | ICD-10-CM

## 2016-08-27 DIAGNOSIS — D638 Anemia in other chronic diseases classified elsewhere: Secondary | ICD-10-CM

## 2016-08-27 DIAGNOSIS — R7401 Elevation of levels of liver transaminase levels: Secondary | ICD-10-CM

## 2016-08-27 NOTE — Telephone Encounter (Signed)
Additional lab request have been added. Patient is to have all labs done 07/03/208. A letter will be sent as a reminder.

## 2016-08-27 NOTE — Telephone Encounter (Signed)
[name] [name], will also need SMA, sedrate,. ANA, AMA. Thanks.

## 2016-08-29 ENCOUNTER — Other Ambulatory Visit (INDEPENDENT_AMBULATORY_CARE_PROVIDER_SITE_OTHER): Payer: Self-pay | Admitting: *Deleted

## 2016-08-29 ENCOUNTER — Encounter (INDEPENDENT_AMBULATORY_CARE_PROVIDER_SITE_OTHER): Payer: Self-pay | Admitting: *Deleted

## 2016-08-29 DIAGNOSIS — R740 Nonspecific elevation of levels of transaminase and lactic acid dehydrogenase [LDH]: Principal | ICD-10-CM

## 2016-08-29 DIAGNOSIS — E876 Hypokalemia: Secondary | ICD-10-CM

## 2016-08-29 DIAGNOSIS — D638 Anemia in other chronic diseases classified elsewhere: Secondary | ICD-10-CM

## 2016-08-29 DIAGNOSIS — R7401 Elevation of levels of liver transaminase levels: Secondary | ICD-10-CM

## 2016-08-29 DIAGNOSIS — R1084 Generalized abdominal pain: Secondary | ICD-10-CM

## 2016-09-04 ENCOUNTER — Telehealth (INDEPENDENT_AMBULATORY_CARE_PROVIDER_SITE_OTHER): Payer: Self-pay | Admitting: Internal Medicine

## 2016-09-04 ENCOUNTER — Encounter (INDEPENDENT_AMBULATORY_CARE_PROVIDER_SITE_OTHER): Payer: Self-pay | Admitting: *Deleted

## 2016-09-04 ENCOUNTER — Other Ambulatory Visit (INDEPENDENT_AMBULATORY_CARE_PROVIDER_SITE_OTHER): Payer: Self-pay | Admitting: *Deleted

## 2016-09-04 DIAGNOSIS — D638 Anemia in other chronic diseases classified elsewhere: Secondary | ICD-10-CM

## 2016-09-04 DIAGNOSIS — E876 Hypokalemia: Secondary | ICD-10-CM

## 2016-09-04 DIAGNOSIS — R740 Nonspecific elevation of levels of transaminase and lactic acid dehydrogenase [LDH]: Secondary | ICD-10-CM

## 2016-09-04 DIAGNOSIS — R7401 Elevation of levels of liver transaminase levels: Secondary | ICD-10-CM

## 2016-09-04 DIAGNOSIS — R109 Unspecified abdominal pain: Secondary | ICD-10-CM

## 2016-09-04 LAB — HEPATIC FUNCTION PANEL
ALT: 109 U/L — ABNORMAL HIGH (ref 6–29)
AST: 62 U/L — ABNORMAL HIGH (ref 10–35)
Albumin: 3.9 g/dL (ref 3.6–5.1)
Alkaline Phosphatase: 312 U/L — ABNORMAL HIGH (ref 33–130)
Bilirubin, Direct: 0.1 mg/dL (ref <=0.2)
Indirect Bilirubin: 0.3 mg/dL (ref 0.2–1.2)
Total Bilirubin: 0.4 mg/dL (ref 0.2–1.2)
Total Protein: 5.9 g/dL — ABNORMAL LOW (ref 6.1–8.1)

## 2016-09-04 LAB — HEPATITIS PANEL, ACUTE
HCV Ab: NEGATIVE
Hep A IgM: NONREACTIVE
Hep B C IgM: NONREACTIVE
Hepatitis B Surface Ag: NEGATIVE

## 2016-09-04 LAB — MITOCHONDRIAL ANTIBODIES: Mitochondrial M2 Ab, IgG: <=20 Units (ref <=20.0)

## 2016-09-04 NOTE — Telephone Encounter (Signed)
Labs have been noted for 09/11/2016. A letter has been sent as a reminder to the patient.

## 2016-09-04 NOTE — Telephone Encounter (Signed)
[name] [name], she also needs a CBC please in 1 week.

## 2016-09-04 NOTE — Telephone Encounter (Signed)
Tammy, Ceruloplasmin, Alpha 1 antitrypsin  CMET, Ferritin in 1 week

## 2016-09-04 NOTE — Telephone Encounter (Signed)
Lab is noted and a letter has been sent to the patient.

## 2016-09-06 LAB — ANTI-SMOOTH MUSCLE ANTIBODY, IGG: Smooth Muscle Ab: <20 U (ref <20)

## 2016-09-10 LAB — COMPLETE METABOLIC PANEL WITH GFR
ALT: 135 U/L — ABNORMAL HIGH (ref 6–29)
AST: 53 U/L — ABNORMAL HIGH (ref 10–35)
Albumin: 3.9 g/dL (ref 3.6–5.1)
Alkaline Phosphatase: 341 U/L — ABNORMAL HIGH (ref 33–130)
BUN: 23 mg/dL (ref 7–25)
CO2: 27 mmol/L (ref 20–31)
Calcium: 8.4 mg/dL — ABNORMAL LOW (ref 8.6–10.4)
Chloride: 105 mmol/L (ref 98–110)
Creat: 1.16 mg/dL — ABNORMAL HIGH (ref 0.60–0.93)
GFR, Est African American: 53 mL/min — ABNORMAL LOW (ref >=60)
GFR, Est Non African American: 46 mL/min — ABNORMAL LOW (ref >=60)
Glucose, Bld: 93 mg/dL (ref 65–99)
Potassium: 3.8 mmol/L (ref 3.5–5.3)
Sodium: 140 mmol/L (ref 135–146)
Total Bilirubin: 0.4 mg/dL (ref 0.2–1.2)
Total Protein: 5.9 g/dL — ABNORMAL LOW (ref 6.1–8.1)

## 2016-09-10 LAB — CBC
HCT: 40.4 % (ref 35.0–45.0)
Hemoglobin: 13 g/dL (ref 11.7–15.5)
MCH: 30.8 pg (ref 27.0–33.0)
MCHC: 32.2 g/dL (ref 32.0–36.0)
MCV: 95.7 fL (ref 80.0–100.0)
MPV: 11.3 fL (ref 7.5–12.5)
Platelets: 150 10*3/uL (ref 140–400)
RBC: 4.22 MIL/uL (ref 3.80–5.10)
RDW: 13.9 % (ref 11.0–15.0)
WBC: 4.3 10*3/uL (ref 3.8–10.8)

## 2016-09-10 LAB — FERRITIN: Ferritin: 80 ng/mL (ref 20–288)

## 2016-09-12 LAB — CERULOPLASMIN: Ceruloplasmin: 24 mg/dL (ref 18–53)

## 2016-09-12 LAB — ALPHA-1-ANTITRYPSIN: A-1 Antitrypsin, Ser: 159 mg/dL (ref 83–199)

## 2016-09-18 ENCOUNTER — Telehealth (INDEPENDENT_AMBULATORY_CARE_PROVIDER_SITE_OTHER): Payer: Self-pay | Admitting: Internal Medicine

## 2016-09-18 NOTE — Telephone Encounter (Signed)
Patient called, stated that she would like lab results.  She is also needing a letter from [facility] stating that it is okay for her to have dental work, some of it will be surgical.  Needs it faxed to Dr. Retta Mac [phone].  [phone]

## 2016-09-19 NOTE — Telephone Encounter (Signed)
I spoke with patient. Am going to discuss her lab work with Dr. Laural Golden. She needs to get note from her PCP concerning dental work.

## 2016-09-20 ENCOUNTER — Telehealth (INDEPENDENT_AMBULATORY_CARE_PROVIDER_SITE_OTHER): Payer: Self-pay | Admitting: Internal Medicine

## 2016-09-20 DIAGNOSIS — R748 Abnormal levels of other serum enzymes: Secondary | ICD-10-CM

## 2016-09-20 NOTE — Telephone Encounter (Signed)
[name] [name], Liver biopsy

## 2016-09-20 NOTE — Telephone Encounter (Signed)
[facility] will call patient directly to schedule

## 2016-09-28 ENCOUNTER — Other Ambulatory Visit: Payer: Self-pay | Admitting: Radiology

## 2016-10-01 ENCOUNTER — Ambulatory Visit (HOSPITAL_COMMUNITY)
Admission: RE | Admit: 2016-10-01 | Discharge: 2016-10-01 | Disposition: A | Discharge status: Home / Self Care | Payer: Medicare Other | Source: Ambulatory Visit | Attending: Internal Medicine | Admitting: Internal Medicine

## 2016-10-01 ENCOUNTER — Encounter (HOSPITAL_COMMUNITY): Payer: Self-pay

## 2016-10-01 DIAGNOSIS — F419 Anxiety disorder, unspecified: Secondary | ICD-10-CM | POA: Diagnosis not present

## 2016-10-01 DIAGNOSIS — Z79899 Other long term (current) drug therapy: Secondary | ICD-10-CM | POA: Diagnosis not present

## 2016-10-01 DIAGNOSIS — Z96643 Presence of artificial hip joint, bilateral: Secondary | ICD-10-CM | POA: Insufficient documentation

## 2016-10-01 DIAGNOSIS — K219 Gastro-esophageal reflux disease without esophagitis: Secondary | ICD-10-CM | POA: Diagnosis not present

## 2016-10-01 DIAGNOSIS — E669 Obesity, unspecified: Secondary | ICD-10-CM | POA: Insufficient documentation

## 2016-10-01 DIAGNOSIS — Z9884 Bariatric surgery status: Secondary | ICD-10-CM | POA: Insufficient documentation

## 2016-10-01 DIAGNOSIS — Z96652 Presence of left artificial knee joint: Secondary | ICD-10-CM | POA: Diagnosis not present

## 2016-10-01 DIAGNOSIS — Z880 Allergy status to penicillin: Secondary | ICD-10-CM | POA: Insufficient documentation

## 2016-10-01 DIAGNOSIS — I10 Essential (primary) hypertension: Secondary | ICD-10-CM | POA: Insufficient documentation

## 2016-10-01 DIAGNOSIS — F329 Major depressive disorder, single episode, unspecified: Secondary | ICD-10-CM | POA: Diagnosis not present

## 2016-10-01 DIAGNOSIS — K740 Hepatic fibrosis: Secondary | ICD-10-CM | POA: Diagnosis not present

## 2016-10-01 DIAGNOSIS — K759 Inflammatory liver disease, unspecified: Secondary | ICD-10-CM | POA: Diagnosis not present

## 2016-10-01 DIAGNOSIS — R748 Abnormal levels of other serum enzymes: Secondary | ICD-10-CM | POA: Diagnosis not present

## 2016-10-01 DIAGNOSIS — Z9049 Acquired absence of other specified parts of digestive tract: Secondary | ICD-10-CM | POA: Diagnosis not present

## 2016-10-01 LAB — COMPREHENSIVE METABOLIC PANEL
ALT: 22 U/L (ref 14–54)
AST: 17 U/L (ref 15–41)
Albumin: 4 g/dL (ref 3.5–5.0)
Alkaline Phosphatase: 213 U/L — ABNORMAL HIGH (ref 38–126)
Anion gap: 8 (ref 5–15)
BUN: 15 mg/dL (ref 6–20)
CO2: 29 mmol/L (ref 22–32)
Calcium: 8.7 mg/dL — ABNORMAL LOW (ref 8.9–10.3)
Chloride: 104 mmol/L (ref 101–111)
Creatinine, Ser: 1.15 mg/dL — ABNORMAL HIGH (ref 0.44–1.00)
GFR calc Af Amer: 53 mL/min — ABNORMAL LOW (ref >60)
GFR calc non Af Amer: 45 mL/min — ABNORMAL LOW (ref >60)
Glucose, Bld: 95 mg/dL (ref 65–99)
Potassium: 3.7 mmol/L (ref 3.5–5.1)
Sodium: 141 mmol/L (ref 135–145)
Total Bilirubin: 0.4 mg/dL (ref 0.3–1.2)
Total Protein: 6.3 g/dL — ABNORMAL LOW (ref 6.5–8.1)

## 2016-10-01 LAB — CBC WITH DIFFERENTIAL/PLATELET
Basophils Absolute: 0 10*3/uL (ref 0.0–0.1)
Basophils Relative: 0 %
Eosinophils Absolute: 0.2 10*3/uL (ref 0.0–0.7)
Eosinophils Relative: 3 %
HCT: 38.5 % (ref 36.0–46.0)
Hemoglobin: 12.6 g/dL (ref 12.0–15.0)
Lymphocytes Relative: 18 %
Lymphs Abs: 1.1 10*3/uL (ref 0.7–4.0)
MCH: 30.7 pg (ref 26.0–34.0)
MCHC: 32.7 g/dL (ref 30.0–36.0)
MCV: 93.9 fL (ref 78.0–100.0)
Monocytes Absolute: 0.7 10*3/uL (ref 0.1–1.0)
Monocytes Relative: 11 %
Neutro Abs: 4.1 10*3/uL (ref 1.7–7.7)
Neutrophils Relative %: 68 %
Platelets: 129 10*3/uL — ABNORMAL LOW (ref 150–400)
RBC: 4.1 MIL/uL (ref 3.87–5.11)
RDW: 13.8 % (ref 11.5–15.5)
WBC: 6 10*3/uL (ref 4.0–10.5)

## 2016-10-01 LAB — PROTIME-INR
INR: 1.01
Prothrombin Time: 13.3 seconds (ref 11.4–15.2)

## 2016-10-01 MED ORDER — LIDOCAINE HCL 1 % IJ SOLN
INTRAMUSCULAR | Status: AC
  Filled 2016-10-01: qty 20

## 2016-10-01 MED ORDER — FENTANYL CITRATE (PF) 100 MCG/2ML IJ SOLN
INTRAMUSCULAR | Status: AC
  Filled 2016-10-01: qty 2

## 2016-10-01 MED ORDER — SODIUM CHLORIDE 0.9 % IV SOLN
INTRAVENOUS | Status: DC
  Administered 2016-10-01: 11:00:00 via INTRAVENOUS

## 2016-10-01 MED ORDER — MIDAZOLAM HCL 2 MG/2ML IJ SOLN
INTRAMUSCULAR | Status: AC | PRN
  Administered 2016-10-01: 1 mg via INTRAVENOUS

## 2016-10-01 MED ORDER — MIDAZOLAM HCL 2 MG/2ML IJ SOLN
INTRAMUSCULAR | Status: AC
  Filled 2016-10-01: qty 2

## 2016-10-01 MED ORDER — FENTANYL CITRATE (PF) 100 MCG/2ML IJ SOLN
INTRAMUSCULAR | Status: AC | PRN
  Administered 2016-10-01: 25 ug via INTRAVENOUS
  Administered 2016-10-01: 50 ug via INTRAVENOUS

## 2016-10-01 NOTE — Consult Note (Signed)
Chief Complaint: Patient was seen in consultation today for image guided random core liver biopsy  Referring Physician(s): Setzer,Terri L/Rheman,N  Supervising Physician: Corrie Mckusick  Patient Status: [facility] - Out-pt  History of Present Illness: [name] [name] is a 76 y.o. female with history of anemia, GERD, obesity, prior gastric bypass in 1979 with revision in 1980, hiatal hernia repair 2011; now with elevated LFTs of unknown etiology with negative laboratory workup so far. She presents today for image guided random core liver biopsy for further evaluation.  Past Medical History:  Diagnosis Date  . Anemia   . Anxiety   . Arthritis   . Depression   . GERD (gastroesophageal reflux disease)   . H/O hiatal hernia   . Hypertension   . Obesity   . Pneumonia   . PONV (postoperative nausea and vomiting)   . Seasonal allergies   . Sepsis ([place]) 10/02/2013    Past Surgical History:  Procedure Laterality Date  . ABDOMINAL HYSTERECTOMY    . APPENDECTOMY    . BACK SURGERY     lumbar x2 by Dr Arnoldo Morale  . CARPAL TUNNEL RELEASE Right 03/2014   Dr. Lorin Mercy  . CATARACT EXTRACTION W/PHACO Left 08/06/2013   Procedure: CATARACT EXTRACTION PHACO AND INTRAOCULAR LENS PLACEMENT LEFT EYE;  Surgeon: Tonny Branch, MD;  Location: [facility];  Service: Ophthalmology;  Laterality: Left;  CDE: 9.55  . CATARACT EXTRACTION W/PHACO Right 08/24/2013   Procedure: CATARACT EXTRACTION PHACO AND INTRAOCULAR LENS PLACEMENT (IOC);  Surgeon: Tonny Branch, MD;  Location: [facility];  Service: Ophthalmology;  Laterality: Right;  CDE:8.30  . CERVICAL FUSION    . CHOLECYSTECTOMY    . COLONOSCOPY N/A 11/03/2013   SLF: 1. Normal mucosa in the terminal ileum. 2. 13 colon polyps removed. 3. Moderate diverticulosis in the descending colon and sigmoid colon 4. the left colon is redundant 5. Small internal  hemorrhoids.  . ESOPHAGOGASTRODUODENOSCOPY N/A 11/03/2013   SLF: 1. Dysphagia most likely due to sliding gastic pouch and non-  adherence to gastric bypass diet 2. Mild Non-erosive gastritis  . EYE SURGERY    . GASTRIC BYPASS  1979   revision in 1980  . HIATAL HERNIA REPAIR     [facility] in 2011  . JOINT REPLACEMENT Right 1995   hip  . JOINT REPLACEMENT Left 2008   hip  . MEDIAL PARTIAL KNEE REPLACEMENT Left    Dr Ronnie Derby  . PARAESOPHAGEAL HERNIA REPAIR  SEP 2010 DR. MCNATT  . SHOULDER ARTHROSCOPY Right   . SHOULDER ARTHROSCOPY Right    x2  . SKIN BIOPSY    . TONSILLECTOMY    . TOTAL SHOULDER ARTHROPLASTY Right 02/16/2013   Procedure: TOTAL SHOULDER ARTHROPLASTY;  Surgeon: Marybelle Killings, MD;  Location: [place];  Service: Orthopedics;  Laterality: Right;  Right Total Shoulder Arthroplasty, Cemented    Allergies: Novocain [procaine hcl]; Other; Latex; Penicillins; and Sulfa antibiotics  Medications: Prior to Admission medications   Medication Sig Start Date End Date Taking? Authorizing Provider  Calcium Citrate-Vitamin D (CITRACAL PETITES/VITAMIN D) 200-250 MG-UNIT TABS Take 1 tablet by mouth daily.   Yes [provider]  diazepam (VALIUM) 5 MG tablet Take 5 mg by mouth 3 (three) times daily as needed. For anxiety   Yes [provider]  docusate sodium (COLACE) 100 MG capsule Take 100 mg by mouth at bedtime.    Yes [provider]  iron polysaccharides (NIFEREX) 150 MG capsule Take 1 capsule (150 mg total) by mouth 2 (two)  times daily. Patient taking differently: Take 325 mg by mouth daily.  10/03/13  Yes Kathie Dike, MD  Lactobacillus (FLORAJEN ACIDOPHILUS PO) Take by mouth.   Yes [provider]  lisinopril-hydrochlorothiazide (PRINZIDE,ZESTORETIC) 20-12.5 MG tablet 1 tablet daily. Reported on 08/03/2015 06/16/15  Yes [provider]  omeprazole (PRILOSEC) 20 MG capsule Take 20 mg by mouth 2 (two) times daily before a meal.   Yes [provider]  ondansetron (ZOFRAN) 4 MG tablet Take 4 mg by mouth every 8 (eight) hours as needed for nausea or vomiting.   Yes  [provider]  potassium chloride (K-DUR) 10 MEQ tablet Take 10 mEq by mouth 3 (three) times daily with meals.    Yes [provider]  sertraline (ZOLOFT) 100 MG tablet Take 100 mg by mouth daily.   Yes [provider]  traZODone (DESYREL) 50 MG tablet Take 50 mg by mouth at bedtime.   Yes [provider]  vitamin B-12 (CYANOCOBALAMIN) 100 MCG tablet Take 500 mcg by mouth daily.    Yes [provider]  acetaminophen (TYLENOL) 325 MG tablet Take 650 mg by mouth every 6 (six) hours as needed for mild pain.    [provider]  albuterol (PROVENTIL) (2.5 MG/3ML) 0.083% nebulizer solution Take 3 mLs (2.5 mg total) by nebulization every 4 (four) hours as needed for wheezing. 07/21/15   Kathie Dike, MD  dextromethorphan-guaiFENesin (MUCINEX DM) 30-600 MG 12hr tablet Take 1 tablet by mouth 2 (two) times daily.     [provider]  guaifenesin (ROBITUSSIN) 100 MG/5ML syrup Take 200 mg by mouth 3 (three) times daily as needed for cough.    [provider]  hydrocortisone-pramoxine (PROCTOFOAM HC) rectal foam Place 1 applicator rectally 2 (two) times daily. 02/15/16   Setzer, Rona Ravens, NP     Family History  Problem Relation Age of Onset  . Colon cancer Brother        IN HIS 60s-METASTATIC  . Hypertension Brother   . Colon polyps Paternal Grandfather   . Breast cancer Mother   . Hypertension Father     Social History   Social History  . Marital status: Single    Spouse name: N/A  . Number of children: N/A  . Years of education: N/A   Social History Main Topics  . Smoking status: Never Smoker  . Smokeless tobacco: Never Used  . Alcohol use No  . Drug use: No  . Sexual activity: Yes    Birth control/ protection: Surgical   Other Topics Concern  . None   Social History Narrative  . None      Review of Systems currently denies fever, chest pain, dyspnea, cough, back pain, vomiting or abnormal bleeding. She  does have occasional headaches/abdominal discomfort, dysphagia, occasional nausea.  Vital Signs: Blood pressure 142/63, heart rate 53, respirations 16, temperature 98.4, O2 sat 95% room air      Physical Exam awake, alert. Chest clear to auscultation bilaterally. Heart with sl bradycardic but reg rhythm. Abdomen obese, soft, mild generalized tenderness to palpation, no lower extremity edema.  Mallampati Score:     Imaging: No results found.  Labs:  CBC:  Recent Labs  07/08/16 1106  07/09/16 0247 07/09/16 0924 09/10/16 0939 10/01/16 1048  WBC 6.0  --   --   --  4.3 6.0  HGB 13.2  < > 12.1 14.0 13.0 12.6  HCT 39.9  < > 38.2 42.6 40.4 38.5  PLT 131*  --   --   --  150 129*  < > = values in this interval not displayed.  COAGS:  Recent Labs  07/08/16 1100 10/01/16 1048  INR 1.08 1.01    BMP:  Recent Labs  07/08/16 1101 07/09/16 0247 08/15/16 1120 09/10/16 0939  NA 138 138 141 140  K 3.9 3.5 4.4 3.8  CL 104 106 105 105  CO2 28 27 27 27   GLUCOSE 126* 111* 103* 93  BUN 22* 15 19 23   CALCIUM 8.7* 8.5* 8.7 8.4*  CREATININE 1.09* 0.91 1.05* 1.16*  GFRNONAA 48* >60  --  46*  GFRAA 56* >60  --  53*    LIVER FUNCTION TESTS:  Recent Labs  07/09/16 0247 08/15/16 1120 09/03/16 0927 09/10/16 0939  BILITOT 1.0 0.5 0.4 0.4  AST 249* 130* 62* 53*  ALT 249* 131* 109* 135*  ALKPHOS 287* 363* 312* 341*  PROT 5.5* 6.0* 5.9* 5.9*  ALBUMIN 3.1* 4.0 3.9 3.9    TUMOR MARKERS: No results for input(s): AFPTM, CEA, CA199, CHROMGRNA in the last 8760 hours.  Assessment and Plan: 76 y.o. female with history of anemia, GERD, obesity, prior gastric bypass in 1979 with revision in 1980, hiatal hernia repair 2011; hx elevated LFTs of unknown etiology (although improved today) with negative laboratory workup so far. She presents today for image guided random core liver biopsy for further evaluation.Risks and benefits discussed with the patient/niece including, but not  limited to bleeding, infection, damage to adjacent structures or low yield requiring additional tests.All of the patient's questions were answered, patient is agreeable to proceed.Consent signed and in chart.      Thank you for this interesting consult.  I greatly enjoyed meeting RICARDO KAYES and look forward to participating in their care.  A copy of this report was sent to the requesting provider on this date.  Electronically Signed: D. Rowe Robert, PA-C 10/01/2016, 11:33 AM   I spent a total of 25 minutes in face to face in clinical consultation, greater than 50% of which was counseling/coordinating care for image guided random core liver biopsy

## 2016-10-01 NOTE — Sedation Documentation (Signed)
Patient denies pain and is resting comfortably.  

## 2016-10-01 NOTE — Sedation Documentation (Signed)
Pt reports Right abd pressure, 8/10, Dr. Earleen Newport gave verbal orders to give a one time dose of Fentanyl 25 mcg IVP. SS RN is to call if additional pain medication is needed.

## 2016-10-01 NOTE — Discharge Instructions (Signed)
Moderate Conscious Sedation, Adult, Care After °These instructions provide you with information about caring for yourself after your procedure. Your health care provider may also give you more specific instructions. Your treatment has been planned according to current medical practices, but problems sometimes occur. Call your health care provider if you have any problems or questions after your procedure. °What can I expect after the procedure? °After your procedure, it is common: °· To feel sleepy for several hours. °· To feel clumsy and have poor balance for several hours. °· To have poor judgment for several hours. °· To vomit if you eat too soon. ° °Follow these instructions at home: °For at least 24 hours after the procedure: ° °· Do not: °? Participate in activities where you could fall or become injured. °? Drive. °? Use heavy machinery. °? Drink alcohol. °? Take sleeping pills or medicines that cause drowsiness. °? Make important decisions or sign legal documents. °? Take care of children on your own. °· Rest. °Eating and drinking °· Follow the diet recommended by your health care provider. °· If you vomit: °? Drink water, juice, or soup when you can drink without vomiting. °? Make sure you have little or no nausea before eating solid foods. °General instructions °· Have a responsible adult stay with you until you are awake and alert. °· Take over-the-counter and prescription medicines only as told by your health care provider. °· If you smoke, do not smoke without supervision. °· Keep all follow-up visits as told by your health care provider. This is important. °Contact a health care provider if: °· You keep feeling nauseous or you keep vomiting. °· You feel light-headed. °· You develop a rash. °· You have a fever. °Get help right away if: °· You have trouble breathing. °This information is not intended to replace advice given to you by your health care provider. Make sure you discuss any questions you have  with your health care provider. °Document Released: 12/10/2012 Document Revised: 07/25/2015 Document Reviewed: 06/11/2015 °Elsevier Interactive Patient Education © 2018 [employer] ° ° °Liver Biopsy, Care After °These instructions give you information on caring for yourself after your procedure. Your doctor may also give you more specific instructions. Call your doctor if you have any problems or questions after your procedure. °Follow these instructions at home: °· Rest at home for 1-2 days or as told by your doctor. °· Have someone stay with you for at least 24 hours. °· Do not do these things in the first 24 hours: °? Drive. °? Use machinery. °? Take care of other people. °? Sign legal documents. °? Take a bath or shower. °· There are many different ways to close and cover a cut (incision). For example, a cut can be closed with stitches, skin glue, or adhesive strips. Follow your doctor's instructions on: °? Taking care of your cut. °? Changing and removing your bandage (dressing). °? Removing whatever was used to close your cut. °· Do not drink alcohol in the first week. °· Do not lift more than 5 pounds or play contact sports for the first 2 weeks. °· Take medicines only as told by your doctor. For 1 week, do not take medicine that has aspirin in it or medicines like ibuprofen. °· Get your test results. °Contact a doctor if: °· A cut bleeds and leaves more than just a small spot of blood. °· A cut is red, puffs up (swells), or hurts more than before. °· Fluid or something else   comes from a cut. °· A cut smells bad. °· You have a fever or chills. °Get help right away if: °· You have swelling, bloating, or pain in your belly (abdomen). °· You get dizzy or faint. °· You have a rash. °· You feel sick to your stomach (nauseous) or throw up (vomit). °· You have trouble breathing, feel short of breath, or feel faint. °· Your chest hurts. °· You have problems talking or seeing. °· You have trouble balancing or moving  your arms or legs. °This information is not intended to replace advice given to you by your health care provider. Make sure you discuss any questions you have with your health care provider. °Document Released: 11/29/2007 Document Revised: 07/28/2015 Document Reviewed: 04/17/2013 °Elsevier Interactive Patient Education © 2018 [employer] ° ° °

## 2016-10-01 NOTE — Procedures (Signed)
Interventional Radiology Procedure Note  Procedure: US guided liver biopsy, medical liver.  .  Complications: None Recommendations:  - Ok to shower tomorrow - Do not submerge for 7 days - Routine care  - follow up pathology  Signed,  Dulcy Fanny. Earleen Newport, DO

## 2016-10-08 ENCOUNTER — Other Ambulatory Visit (INDEPENDENT_AMBULATORY_CARE_PROVIDER_SITE_OTHER): Payer: Self-pay | Admitting: *Deleted

## 2016-10-08 ENCOUNTER — Encounter (INDEPENDENT_AMBULATORY_CARE_PROVIDER_SITE_OTHER): Payer: Self-pay | Admitting: *Deleted

## 2016-10-08 DIAGNOSIS — R740 Nonspecific elevation of levels of transaminase and lactic acid dehydrogenase [LDH]: Principal | ICD-10-CM

## 2016-10-08 DIAGNOSIS — R7401 Elevation of levels of liver transaminase levels: Secondary | ICD-10-CM

## 2016-10-11 ENCOUNTER — Telehealth (INDEPENDENT_AMBULATORY_CARE_PROVIDER_SITE_OTHER): Payer: Self-pay | Admitting: Internal Medicine

## 2016-10-11 DIAGNOSIS — R748 Abnormal levels of other serum enzymes: Secondary | ICD-10-CM

## 2016-10-11 NOTE — Telephone Encounter (Signed)
I discussed with Dr. Laural Golden.

## 2016-10-12 LAB — HEPATIC FUNCTION PANEL
ALT: 10 U/L (ref 6–29)
AST: 13 U/L (ref 10–35)
Albumin: 4 g/dL (ref 3.6–5.1)
Alkaline Phosphatase: 155 U/L — ABNORMAL HIGH (ref 33–130)
Bilirubin, Direct: 0.1 mg/dL (ref <=0.2)
Indirect Bilirubin: 0.3 mg/dL (ref 0.2–1.2)
Total Bilirubin: 0.4 mg/dL (ref 0.2–1.2)
Total Protein: 6 g/dL — ABNORMAL LOW (ref 6.1–8.1)

## 2016-10-13 LAB — SEDIMENTATION RATE: Sed Rate: 3 mm/hr (ref 0–30)

## 2016-10-15 LAB — ANA: Anti Nuclear Antibody(ANA): NEGATIVE

## 2016-10-17 ENCOUNTER — Other Ambulatory Visit (INDEPENDENT_AMBULATORY_CARE_PROVIDER_SITE_OTHER): Payer: Self-pay | Admitting: *Deleted

## 2016-10-17 DIAGNOSIS — K760 Fatty (change of) liver, not elsewhere classified: Secondary | ICD-10-CM

## 2016-10-18 IMAGING — RF DG UGI W/ HIGH DENSITY W/KUB
16 of 24 series · 16 of 24 positions shown · non-contrast
Comparison: None

CLINICAL DATA: Dysphagia, post paraesophageal hernia repair with
gastric bypass

EXAM:
UPPER GI SERIES WITH KUB
TECHNIQUE: After obtaining a scout radiograph a routine upper GI series was
performed using thin and high density barium. Patient swallowed a
12.5 mm diameter barium tablet as well.

[Series 1: run · 1 of 1 slices shown (1 of 16)]
[im 1/1]
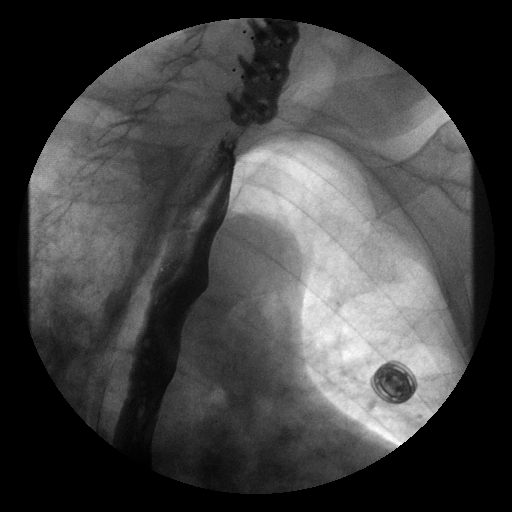

[Series 3: run · 1 of 1 slices shown (2 of 16)]
[im 1/1]
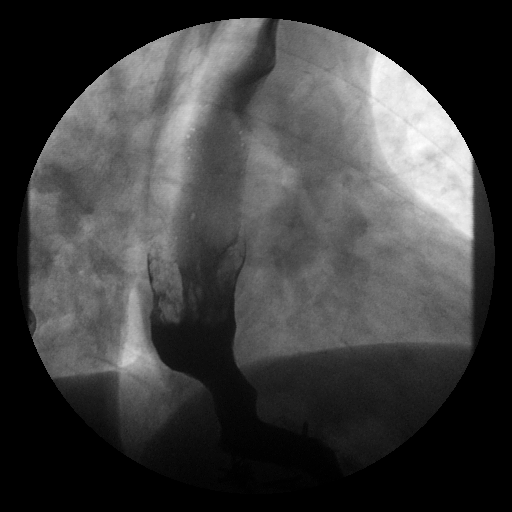

[Series 4: run · 1 of 1 slices shown (3 of 16)]
[im 1/1]
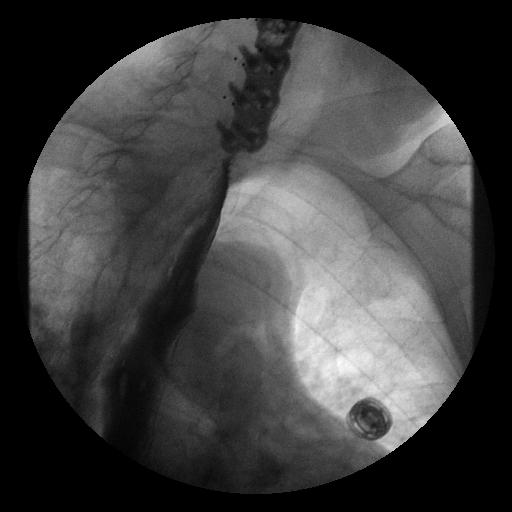

[Series 6: run · 1 of 1 slices shown (4 of 16)]
[im 1/1]
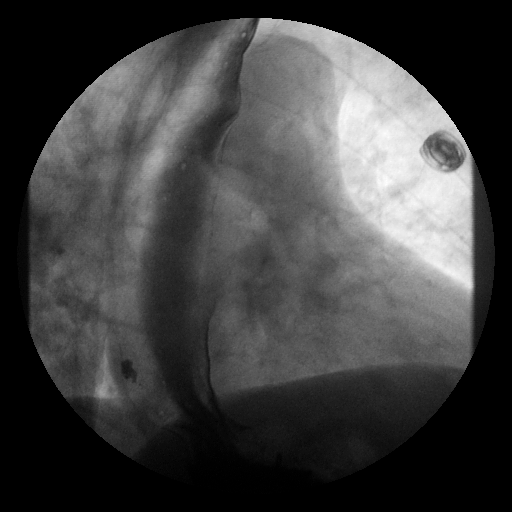

[Series 7: run · 1 of 1 slices shown (5 of 16)]
[im 1/1]
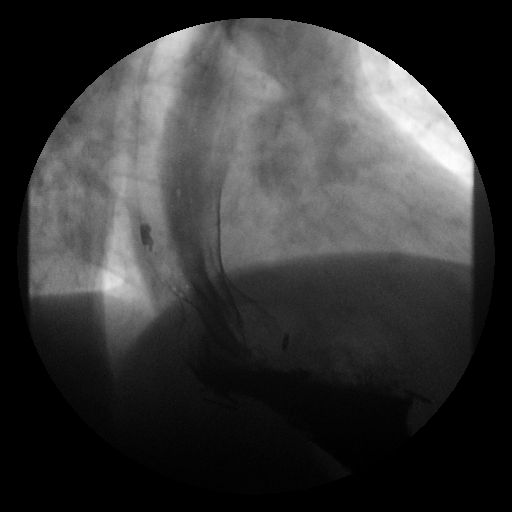

[Series 9: run · 1 of 1 slices shown (6 of 16)]
[im 1/1]
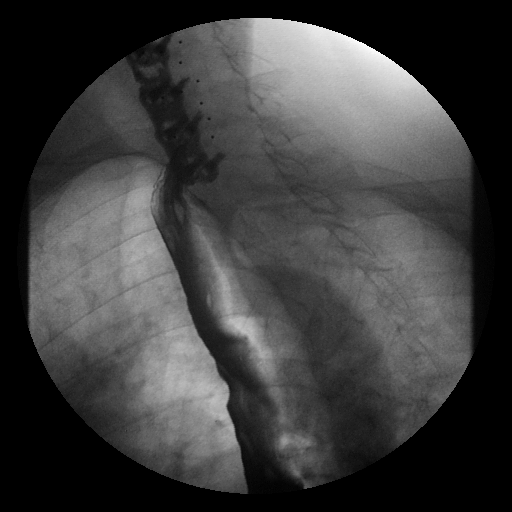

[Series 10: run · 1 of 1 slices shown (7 of 16)]
[im 1/1]
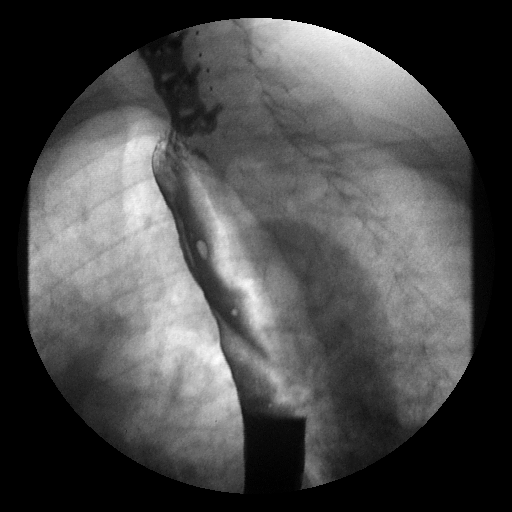

[Series 12: run · 1 of 1 slices shown (8 of 16)]
[im 1/1]
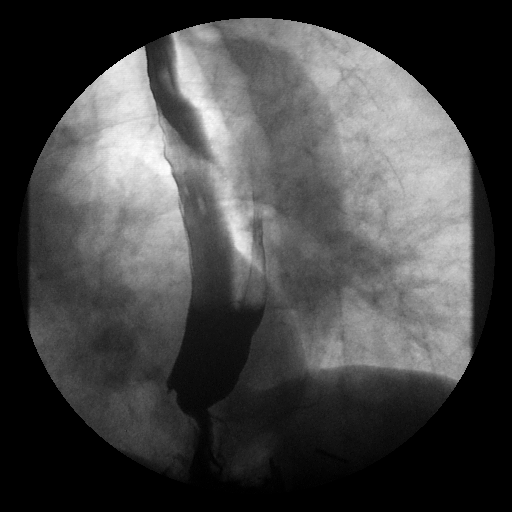

[Series 13: run · 1 of 1 slices shown (9 of 16)]
[im 1/1]
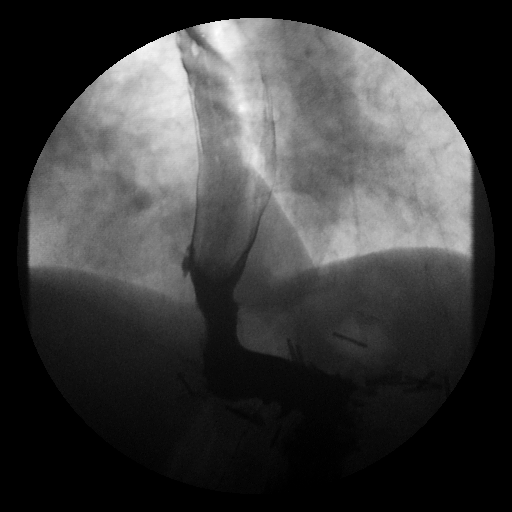

[Series 15: run · 1 of 1 slices shown (10 of 16)]
[im 1/1]
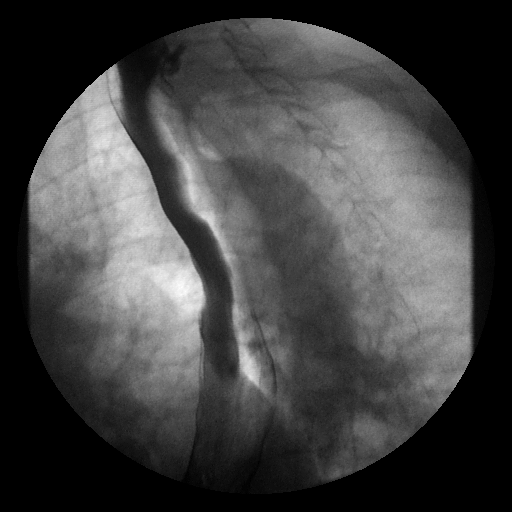

[Series 16: run · 1 of 1 slices shown (11 of 16)]
[im 1/1]
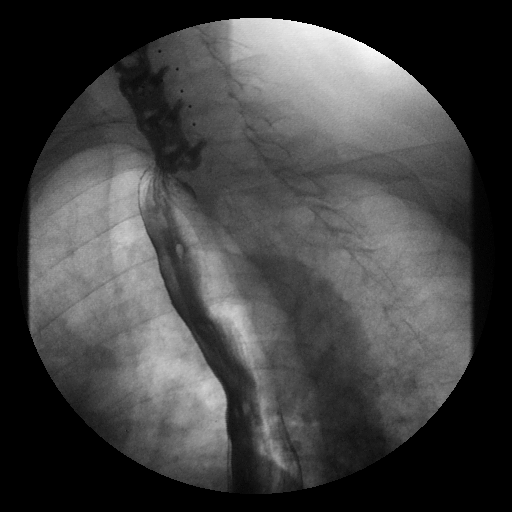

[Series 18: run · 1 of 1 slices shown (12 of 16)]
[im 1/1]
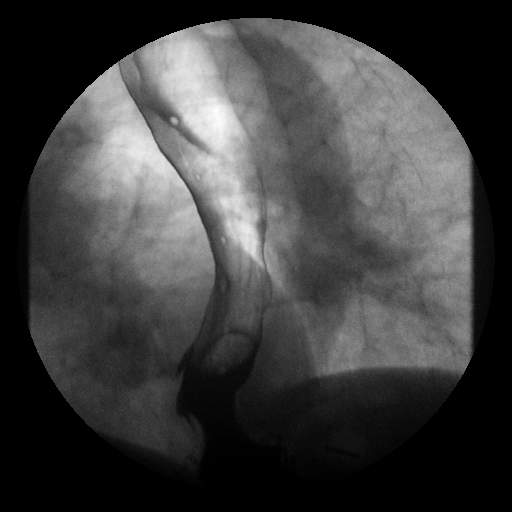

[Series 19: run · 1 of 1 slices shown (13 of 16)]
[im 1/1]
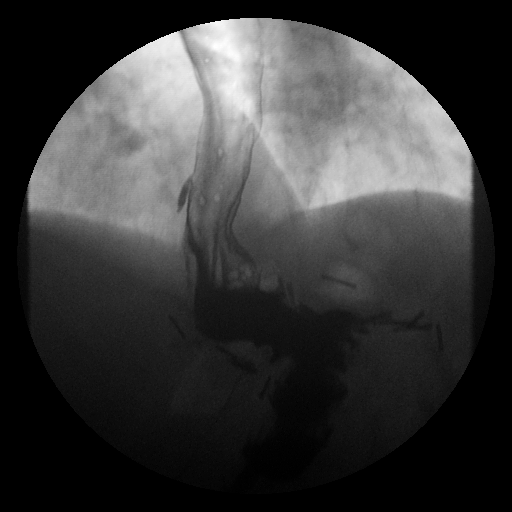

[Series 21: run · 1 of 9 slices shown (14 of 16)]
[im 1/9]
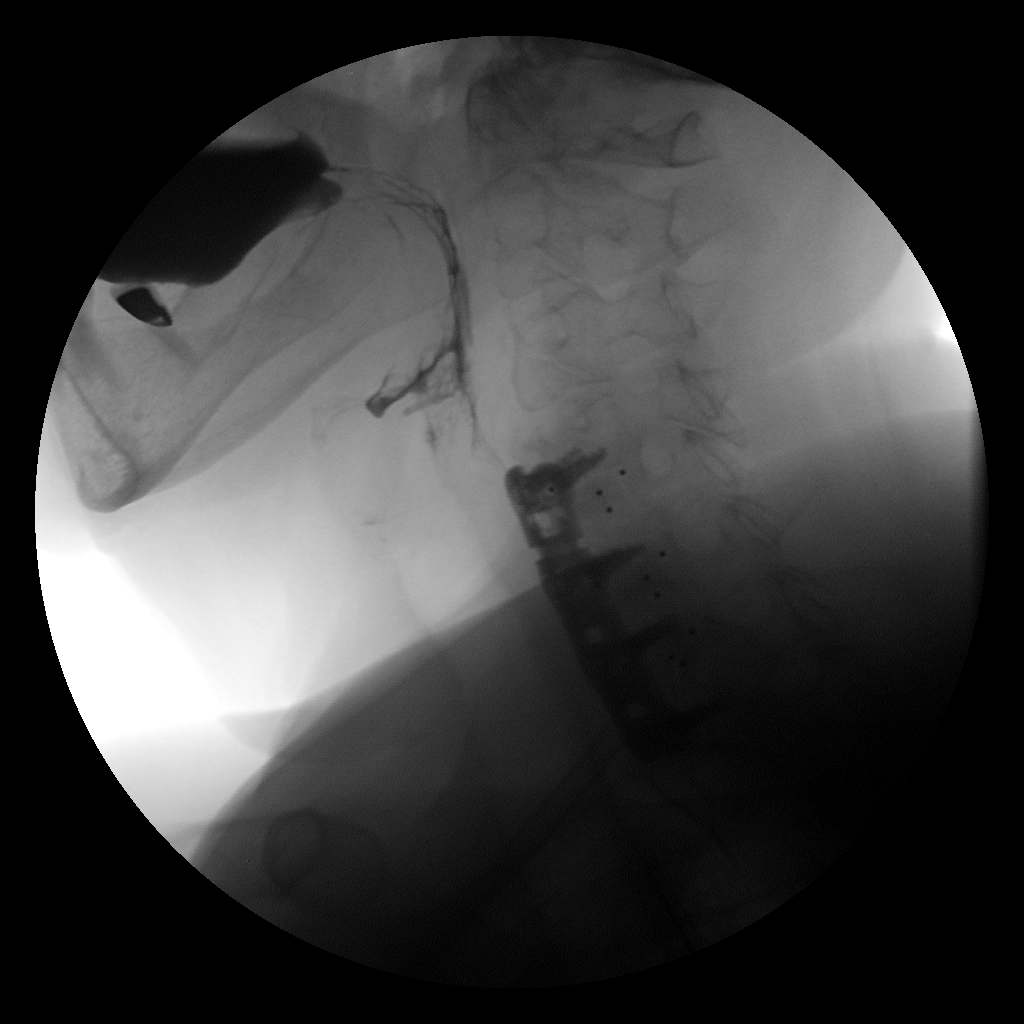

[Series 22: run · 1 of 1 slices shown (15 of 16)]
[im 1/1]
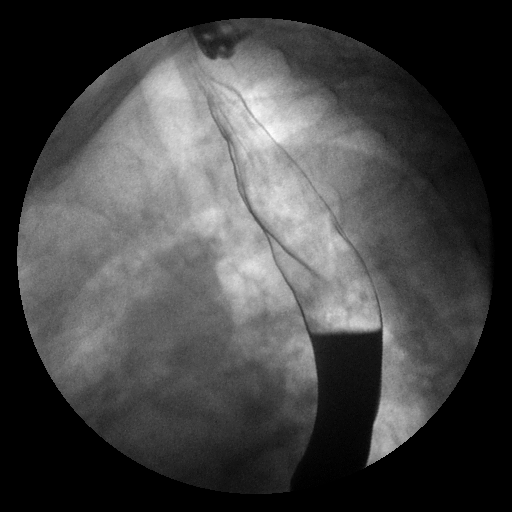

[Series 24: run · 1 of 1 slices shown (16 of 16)]
[im 1/1]
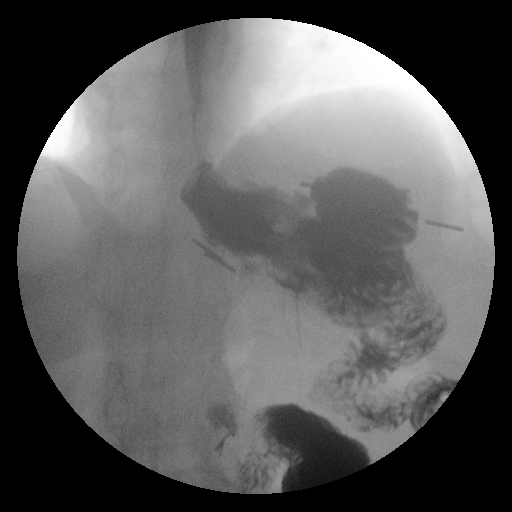

[16 of 24 positions shown; findings below may reference images not displayed]

FLUOROSCOPY TIME:  Radiation Exposure Index (as provided by the
fluoroscopic device): Not provided

If the device does not provide the exposure index:

Fluoroscopy Time (in minutes and seconds):  2 minutes 48 seconds

Number of Acquired Images: Single scout image, 42 screen captures
during fluoroscopy.
FINDINGS: Normal bowel gas pattern on scout image.

No bowel dilatation or wall thickening.

Bones demineralized with degenerative changes lumbar spine and
BILATERAL hip prostheses.

Scattered surgical clips RIGHT upper quadrant, perigastric, and LEFT
mid abdomen.

Prior cervical spine fusion.

Esophagus distends normally without mass or stricture.

Smooth appearance of esophageal mucosa on air contrast imaging.

No persistent intraluminal filling defects.

12.5 mm diameter barium tablet passes from oral cavity to stomach
without obstruction.

Expected age-related esophageal dysmotility.

Targeted rapid sequence imaging of the cervical esophagus and
hypopharynx showed no laryngeal penetration or aspiration.

Prior gastric bypass with small reservoir and free passage contrast
into a proximal jejunal loop.

No evidence of anastomotic stricture or obstruction.

Mild gastroesophageal reflux during exam.

No definite gastric mass or a fold thickening.

Visualized jejunal loops normal appearance, demonstrating normal
fold and wall thickness.

Tiny curvilinear collection of contrast material is seen anterior
and slightly to the RIGHT of the proximal jejunal loop of the
gastrojejunostomy anastomosis, appears contained, question within a
residual fold/recess of native residual stomach
IMPRESSION: Post gastric bypass with patent gastrojejunostomy anastomosis.

No evidence of obstruction, mass or ulceration.

Mild gastroesophageal reflux.

Age-related esophageal dysmotility without esophageal obstruction.

Tiny collection of barium within a fold/recess of the
native/residual excluded stomach.

## 2016-10-26 ENCOUNTER — Encounter (INDEPENDENT_AMBULATORY_CARE_PROVIDER_SITE_OTHER): Payer: Self-pay | Admitting: Internal Medicine

## 2016-10-26 NOTE — Telephone Encounter (Signed)
An appointment was given for 04/29/17 at 2:15pm with Deberah Castle, NP.  A letter was mailed to the patient.

## 2016-10-30 ENCOUNTER — Other Ambulatory Visit (INDEPENDENT_AMBULATORY_CARE_PROVIDER_SITE_OTHER): Payer: Self-pay | Admitting: *Deleted

## 2016-10-30 DIAGNOSIS — R740 Nonspecific elevation of levels of transaminase and lactic acid dehydrogenase [LDH]: Principal | ICD-10-CM

## 2016-10-30 DIAGNOSIS — R7401 Elevation of levels of liver transaminase levels: Secondary | ICD-10-CM

## 2016-10-30 LAB — HEPATIC FUNCTION PANEL
ALT: 11 IU/L (ref 0–32)
AST: 14 IU/L (ref 0–40)
Albumin: 4.1 g/dL (ref 3.5–4.8)
Alkaline Phosphatase: 144 IU/L — ABNORMAL HIGH (ref 39–117)
Bilirubin Total: 0.3 mg/dL (ref 0.0–1.2)
Bilirubin, Direct: 0.1 mg/dL (ref 0.00–0.40)
Total Protein: 6.1 g/dL (ref 6.0–8.5)

## 2017-02-05 ENCOUNTER — Emergency Department (HOSPITAL_COMMUNITY): Payer: Medicare Other

## 2017-02-05 ENCOUNTER — Encounter (HOSPITAL_COMMUNITY): Payer: Self-pay

## 2017-02-05 ENCOUNTER — Inpatient Hospital Stay (HOSPITAL_COMMUNITY)
Admission: EM | Admit: 2017-02-05 | Discharge: 2017-02-09 | DRG: 445 | Disposition: A | Discharge status: Other Acute Inpatient Hospital | Payer: Medicare Other | Attending: Internal Medicine | Admitting: Internal Medicine

## 2017-02-05 ENCOUNTER — Other Ambulatory Visit: Payer: Self-pay

## 2017-02-05 DIAGNOSIS — F32A Depression, unspecified: Secondary | ICD-10-CM | POA: Diagnosis present

## 2017-02-05 DIAGNOSIS — F329 Major depressive disorder, single episode, unspecified: Secondary | ICD-10-CM | POA: Diagnosis present

## 2017-02-05 DIAGNOSIS — F419 Anxiety disorder, unspecified: Secondary | ICD-10-CM | POA: Diagnosis present

## 2017-02-05 DIAGNOSIS — R1010 Upper abdominal pain, unspecified: Secondary | ICD-10-CM

## 2017-02-05 DIAGNOSIS — K8051 Calculus of bile duct without cholangitis or cholecystitis with obstruction: Principal | ICD-10-CM | POA: Diagnosis present

## 2017-02-05 DIAGNOSIS — K219 Gastro-esophageal reflux disease without esophagitis: Secondary | ICD-10-CM | POA: Diagnosis present

## 2017-02-05 DIAGNOSIS — I10 Essential (primary) hypertension: Secondary | ICD-10-CM | POA: Diagnosis present

## 2017-02-05 DIAGNOSIS — Z6841 Body Mass Index (BMI) 40.0 and over, adult: Secondary | ICD-10-CM

## 2017-02-05 DIAGNOSIS — R7401 Elevation of levels of liver transaminase levels: Secondary | ICD-10-CM | POA: Diagnosis present

## 2017-02-05 DIAGNOSIS — R001 Bradycardia, unspecified: Secondary | ICD-10-CM | POA: Diagnosis present

## 2017-02-05 DIAGNOSIS — K831 Obstruction of bile duct: Secondary | ICD-10-CM | POA: Diagnosis present

## 2017-02-05 DIAGNOSIS — Z882 Allergy status to sulfonamides status: Secondary | ICD-10-CM

## 2017-02-05 DIAGNOSIS — D72819 Decreased white blood cell count, unspecified: Secondary | ICD-10-CM | POA: Diagnosis present

## 2017-02-05 DIAGNOSIS — Z884 Allergy status to anesthetic agent status: Secondary | ICD-10-CM

## 2017-02-05 DIAGNOSIS — Q396 Congenital diverticulum of esophagus: Secondary | ICD-10-CM

## 2017-02-05 DIAGNOSIS — K8050 Calculus of bile duct without cholangitis or cholecystitis without obstruction: Secondary | ICD-10-CM | POA: Diagnosis present

## 2017-02-05 DIAGNOSIS — R933 Abnormal findings on diagnostic imaging of other parts of digestive tract: Secondary | ICD-10-CM | POA: Diagnosis present

## 2017-02-05 DIAGNOSIS — R52 Pain, unspecified: Secondary | ICD-10-CM

## 2017-02-05 DIAGNOSIS — D696 Thrombocytopenia, unspecified: Secondary | ICD-10-CM | POA: Diagnosis present

## 2017-02-05 DIAGNOSIS — Z91040 Latex allergy status: Secondary | ICD-10-CM

## 2017-02-05 DIAGNOSIS — E876 Hypokalemia: Secondary | ICD-10-CM | POA: Diagnosis not present

## 2017-02-05 DIAGNOSIS — Z96652 Presence of left artificial knee joint: Secondary | ICD-10-CM | POA: Diagnosis present

## 2017-02-05 DIAGNOSIS — R740 Nonspecific elevation of levels of transaminase and lactic acid dehydrogenase [LDH]: Secondary | ICD-10-CM

## 2017-02-05 DIAGNOSIS — Z96643 Presence of artificial hip joint, bilateral: Secondary | ICD-10-CM | POA: Diagnosis present

## 2017-02-05 DIAGNOSIS — K2270 Barrett's esophagus without dysplasia: Secondary | ICD-10-CM | POA: Diagnosis present

## 2017-02-05 DIAGNOSIS — Z880 Allergy status to penicillin: Secondary | ICD-10-CM

## 2017-02-05 DIAGNOSIS — Z9109 Other allergy status, other than to drugs and biological substances: Secondary | ICD-10-CM

## 2017-02-05 DIAGNOSIS — E860 Dehydration: Secondary | ICD-10-CM | POA: Diagnosis present

## 2017-02-05 DIAGNOSIS — Z9049 Acquired absence of other specified parts of digestive tract: Secondary | ICD-10-CM

## 2017-02-05 DIAGNOSIS — J302 Other seasonal allergic rhinitis: Secondary | ICD-10-CM | POA: Diagnosis present

## 2017-02-05 DIAGNOSIS — K210 Gastro-esophageal reflux disease with esophagitis: Secondary | ICD-10-CM | POA: Diagnosis present

## 2017-02-05 DIAGNOSIS — Z9884 Bariatric surgery status: Secondary | ICD-10-CM

## 2017-02-05 HISTORY — DX: Barrett's esophagus without dysplasia: K22.70

## 2017-02-05 LAB — COMPREHENSIVE METABOLIC PANEL
ALT: 242 U/L — ABNORMAL HIGH (ref 14–54)
AST: 645 U/L — ABNORMAL HIGH (ref 15–41)
Albumin: 4.1 g/dL (ref 3.5–5.0)
Alkaline Phosphatase: 357 U/L — ABNORMAL HIGH (ref 38–126)
Anion gap: 7 (ref 5–15)
BUN: 23 mg/dL — ABNORMAL HIGH (ref 6–20)
CO2: 26 mmol/L (ref 22–32)
Calcium: 9 mg/dL (ref 8.9–10.3)
Chloride: 107 mmol/L (ref 101–111)
Creatinine, Ser: 1.06 mg/dL — ABNORMAL HIGH (ref 0.44–1.00)
GFR calc Af Amer: 58 mL/min — ABNORMAL LOW (ref >60)
GFR calc non Af Amer: 50 mL/min — ABNORMAL LOW (ref >60)
Glucose, Bld: 153 mg/dL — ABNORMAL HIGH (ref 65–99)
Potassium: 3.7 mmol/L (ref 3.5–5.1)
Sodium: 140 mmol/L (ref 135–145)
Total Bilirubin: 1.6 mg/dL — ABNORMAL HIGH (ref 0.3–1.2)
Total Protein: 6.6 g/dL (ref 6.5–8.1)

## 2017-02-05 LAB — URINALYSIS, ROUTINE W REFLEX MICROSCOPIC
Bilirubin Urine: NEGATIVE
Glucose, UA: NEGATIVE mg/dL
Hgb urine dipstick: NEGATIVE
Ketones, ur: NEGATIVE mg/dL
Nitrite: NEGATIVE
Protein, ur: NEGATIVE mg/dL
Specific Gravity, Urine: 1.016 (ref 1.005–1.030)
Squamous Epithelial / LPF: NONE SEEN
pH: 5 (ref 5.0–8.0)

## 2017-02-05 LAB — CBC WITH DIFFERENTIAL/PLATELET
Basophils Absolute: 0 10*3/uL (ref 0.0–0.1)
Basophils Relative: 0 %
Eosinophils Absolute: 0.1 10*3/uL (ref 0.0–0.7)
Eosinophils Relative: 1 %
HCT: 40.6 % (ref 36.0–46.0)
Hemoglobin: 12.8 g/dL (ref 12.0–15.0)
Lymphocytes Relative: 19 %
Lymphs Abs: 1 10*3/uL (ref 0.7–4.0)
MCH: 30.7 pg (ref 26.0–34.0)
MCHC: 31.5 g/dL (ref 30.0–36.0)
MCV: 97.4 fL (ref 78.0–100.0)
Monocytes Absolute: 0.1 10*3/uL (ref 0.1–1.0)
Monocytes Relative: 2 %
Neutro Abs: 4.2 10*3/uL (ref 1.7–7.7)
Neutrophils Relative %: 77 %
Platelets: 112 10*3/uL — ABNORMAL LOW (ref 150–400)
RBC: 4.17 MIL/uL (ref 3.87–5.11)
RDW: 14.4 % (ref 11.5–15.5)
WBC: 5.4 10*3/uL (ref 4.0–10.5)

## 2017-02-05 LAB — LIPASE, BLOOD: Lipase: 24 U/L (ref 11–51)

## 2017-02-05 LAB — PROTIME-INR
INR: 1.11
Prothrombin Time: 14.2 seconds (ref 11.4–15.2)

## 2017-02-05 LAB — MAGNESIUM: Magnesium: 1.6 mg/dL — ABNORMAL LOW (ref 1.7–2.4)

## 2017-02-05 LAB — POC OCCULT BLOOD, ED: Fecal Occult Bld: NEGATIVE

## 2017-02-05 MED ORDER — ONDANSETRON HCL 4 MG/2ML IJ SOLN
4.0000 mg | Freq: Four times a day (QID) | INTRAMUSCULAR | Status: DC | PRN
  Administered 2017-02-06: 4 mg via INTRAVENOUS
  Filled 2017-02-05: qty 2

## 2017-02-05 MED ORDER — SODIUM CHLORIDE 0.9 % IV SOLN
80.0000 mg | Freq: Once | INTRAVENOUS | Status: AC
  Administered 2017-02-05: 80 mg via INTRAVENOUS
  Filled 2017-02-05: qty 80

## 2017-02-05 MED ORDER — DIAZEPAM 5 MG PO TABS
5.0000 mg | ORAL_TABLET | Freq: Every day | ORAL | Status: DC | PRN
  Administered 2017-02-08: 5 mg via ORAL
  Filled 2017-02-05: qty 1

## 2017-02-05 MED ORDER — PANTOPRAZOLE SODIUM 40 MG IV SOLR
INTRAVENOUS | Status: AC
  Filled 2017-02-05: qty 80

## 2017-02-05 MED ORDER — VITAMIN B-12 100 MCG PO TABS
500.0000 ug | ORAL_TABLET | Freq: Every day | ORAL | Status: DC
  Administered 2017-02-07 – 2017-02-09 (×3): 500 ug via ORAL
  Filled 2017-02-05 (×3): qty 5

## 2017-02-05 MED ORDER — MAGNESIUM SULFATE 2 GM/50ML IV SOLN
2.0000 g | Freq: Once | INTRAVENOUS | Status: AC
  Administered 2017-02-05: 2 g via INTRAVENOUS
  Filled 2017-02-05: qty 50

## 2017-02-05 MED ORDER — IOPAMIDOL (ISOVUE-300) INJECTION 61%
100.0000 mL | Freq: Once | INTRAVENOUS | Status: AC | PRN
  Administered 2017-02-05: 100 mL via INTRAVENOUS

## 2017-02-05 MED ORDER — SODIUM CHLORIDE 0.45 % IV SOLN
INTRAVENOUS | Status: DC
  Administered 2017-02-05 – 2017-02-08 (×7): via INTRAVENOUS

## 2017-02-05 MED ORDER — TRAZODONE HCL 50 MG PO TABS
50.0000 mg | ORAL_TABLET | Freq: Every day | ORAL | Status: DC
  Administered 2017-02-05 – 2017-02-08 (×4): 50 mg via ORAL
  Filled 2017-02-05 (×4): qty 1

## 2017-02-05 MED ORDER — ACETAMINOPHEN 325 MG PO TABS
650.0000 mg | ORAL_TABLET | Freq: Four times a day (QID) | ORAL | Status: DC | PRN
  Administered 2017-02-06: 650 mg via ORAL
  Filled 2017-02-05: qty 2

## 2017-02-05 MED ORDER — MORPHINE SULFATE (PF) 4 MG/ML IV SOLN
4.0000 mg | Freq: Once | INTRAVENOUS | Status: AC
  Administered 2017-02-05: 4 mg via INTRAVENOUS
  Filled 2017-02-05: qty 1

## 2017-02-05 MED ORDER — PANTOPRAZOLE SODIUM 40 MG IV SOLR
40.0000 mg | Freq: Two times a day (BID) | INTRAVENOUS | Status: DC
  Administered 2017-02-06 (×2): 40 mg via INTRAVENOUS
  Filled 2017-02-05 (×2): qty 40

## 2017-02-05 MED ORDER — ALBUTEROL SULFATE (2.5 MG/3ML) 0.083% IN NEBU: 2.5000 mg | INHALATION_SOLUTION | RESPIRATORY_TRACT | Status: DC | PRN

## 2017-02-05 MED ORDER — METOCLOPRAMIDE HCL 5 MG/ML IJ SOLN
5.0000 mg | Freq: Once | INTRAMUSCULAR | Status: AC
  Administered 2017-02-05: 5 mg via INTRAVENOUS
  Filled 2017-02-05: qty 2

## 2017-02-05 MED ORDER — MORPHINE SULFATE (PF) 4 MG/ML IV SOLN
4.0000 mg | INTRAVENOUS | Status: DC | PRN
  Administered 2017-02-05: 4 mg via INTRAVENOUS
  Filled 2017-02-05: qty 1

## 2017-02-05 MED ORDER — ONDANSETRON HCL 4 MG/2ML IJ SOLN
4.0000 mg | Freq: Once | INTRAMUSCULAR | Status: AC | PRN
  Administered 2017-02-05: 4 mg via INTRAVENOUS
  Filled 2017-02-05: qty 2

## 2017-02-05 MED ORDER — SERTRALINE HCL 50 MG PO TABS
100.0000 mg | ORAL_TABLET | Freq: Every day | ORAL | Status: DC
  Administered 2017-02-07 – 2017-02-09 (×3): 100 mg via ORAL
  Filled 2017-02-05 (×3): qty 2

## 2017-02-05 MED ORDER — SODIUM CHLORIDE 0.9 % IV BOLUS (SEPSIS)
1000.0000 mL | Freq: Once | INTRAVENOUS | Status: AC
  Administered 2017-02-05: 1000 mL via INTRAVENOUS

## 2017-02-05 MED ORDER — DOCUSATE SODIUM 100 MG PO CAPS
100.0000 mg | ORAL_CAPSULE | Freq: Every day | ORAL | Status: DC
  Administered 2017-02-05 – 2017-02-08 (×4): 100 mg via ORAL
  Filled 2017-02-05 (×4): qty 1

## 2017-02-05 NOTE — H&P (Signed)
History and Physical    [name] [name] [name]: 1940/12/28 DOA: 02/05/2017  PCP: [name] Percy, MD   Patient coming from: Home.  I have personally briefly reviewed patient's old medical records in [place]  Chief Complaint: Abdominal pain.  HPI: [name] [name] is a 76 y.o. female with medical history significant of anemia, anxiety, depression, osteoarthritis, GERD, history of hiatal hernia, hypertension, obesity, history of pneumonia, seasonal allergies who is coming to the emergency department with complaints of colicky abdominal pain in the RUQ/epigastric area since this morning around 0700 after eating breakfast associated with nausea and multiple episodes of emesis  ED Course: Initial vital signs in the emergency department temperature 36.8C, pulse of 88, respirations 18, blood pressure 120/86 mmHg and O2 sat 99% on room air.    She was given Protonix 80 mg IVP, morphine 4 mg IVP x2, Zofran 4 mg x1 and metoclopramide 5 mg IVP x1.  The case was discussed by Dr. Winfred Leeds with Dr. Manus Rudd who recommended admission, IV hydration, Protonix, and to keep n.p.o. after midnight.  Her workup shows an urinalysis with moderate leukocyte esterase and 6-30 WBC per hpf on microscopic exam.  A normal CBC, except for platelets of 112.  WBC was 5.4 hemoglobin 12.8 g/dL.  Chemistry shows bilirubin of 1.6, glucose of 153, BUN of 23, creatinine 1.06 and magnesium of 1.6 mg/dL.  All of her electrolytes were within normal limits.  However, her LFTs showed AST of 645, ALT of 242 and alkaline phosphatase of 357.  Imaging: CT abdomen/pelvis with contrast shows circumferential thickening distal esophagus. Although under distension may partially explain this, underlying mass or result of esophagitis cannot be excluded. No findings of internal hernia as can be seen post gastric bypass. Evaluation of bowel within the pelvis limited by streak artifact from hip replacements. Scattered  colonic diverticula without extraluminal bowel inflammatory process identified. 2 anterior abdominal wall fat and vessel containing hernias. No bowel containing hernia. Post cholecystectomy. Mild prominence of intrahepatic and extrahepatic biliary ducts may represent a normal finding in a patient of this age who is post cholecystectomy (assuming normal liver enzymes). Splenomegaly with spleen spanning over 13.5 cm similar to prior exam.  Review of Systems: As per HPI otherwise 10 point review of systems negative.    Past Medical History:  Diagnosis Date  . Anemia   . Anxiety   . Arthritis   . Depression   . GERD (gastroesophageal reflux disease)   . H/O hiatal hernia   . Hypertension   . Obesity   . Pneumonia   . PONV (postoperative nausea and vomiting)   . Seasonal allergies   . Sepsis ([place]) 10/02/2013    Past Surgical History:  Procedure Laterality Date  . ABDOMINAL HYSTERECTOMY    . ABDOMINAL SURGERY    . APPENDECTOMY    . BACK SURGERY     lumbar x2 by Dr Arnoldo Morale  . CARPAL TUNNEL RELEASE Right 03/2014   Dr. Lorin Mercy  . CATARACT EXTRACTION W/PHACO Left 08/06/2013   Procedure: CATARACT EXTRACTION PHACO AND INTRAOCULAR LENS PLACEMENT LEFT EYE;  Surgeon: Tonny Branch, MD;  Location: [facility];  Service: Ophthalmology;  Laterality: Left;  CDE: 9.55  . CATARACT EXTRACTION W/PHACO Right 08/24/2013   Procedure: CATARACT EXTRACTION PHACO AND INTRAOCULAR LENS PLACEMENT (IOC);  Surgeon: Tonny Branch, MD;  Location: [facility];  Service: Ophthalmology;  Laterality: Right;  CDE:8.30  . CERVICAL FUSION    . CHOLECYSTECTOMY    .  COLONOSCOPY N/A 11/03/2013   SLF: 1. Normal mucosa in the terminal ileum. 2. 13 colon polyps removed. 3. Moderate diverticulosis in the descending colon and sigmoid colon 4. the left colon is redundant 5. Small internal  hemorrhoids.  . ESOPHAGOGASTRODUODENOSCOPY N/A 11/03/2013   SLF: 1. 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 Mild  Non-erosive gastritis  . EYE SURGERY    . GASTRIC BYPASS  1979   revision in 1980  . HIATAL HERNIA REPAIR     [facility] in 2011  . JOINT REPLACEMENT Right 1995   hip  . JOINT REPLACEMENT Left 2008   hip  . MEDIAL PARTIAL KNEE REPLACEMENT Left    Dr Ronnie Derby  . PARAESOPHAGEAL HERNIA REPAIR  SEP 2010 DR. MCNATT  . SHOULDER ARTHROSCOPY Right   . SHOULDER ARTHROSCOPY Right    x2  . SKIN BIOPSY    . TONSILLECTOMY    . TOTAL SHOULDER ARTHROPLASTY Right 02/16/2013   Procedure: TOTAL SHOULDER ARTHROPLASTY;  Surgeon: Marybelle Killings, MD;  Location: [facility];  Service: Orthopedics;  Laterality: Right;  Right Total Shoulder Arthroplasty, Cemented     reports that  has never smoked. she has never used smokeless tobacco. She reports that she does not drink alcohol or use drugs.  Allergies  Allergen Reactions  . Novocain [Procaine Hcl]     Unknown-passed out with novocaine injected for dental surgery.  . Other Hives    EKG pads - need to use pediatric pads  . Latex Rash  . Penicillins Rash    Has patient had a PCN reaction causing immediate rash, facial/tongue/throat swelling, SOB or lightheadedness with hypotension: Yes Has patient had a PCN reaction causing severe rash involving mucus membranes or skin necrosis: No Has patient had a PCN reaction that required hospitalization No Has patient had a PCN reaction occurring within the last 10 years: No  If all of the above answers are "NO", then may proceed with Cephalosporin use.   . Sulfa Antibiotics Rash    Family History  Problem Relation Age of Onset  . Colon cancer Brother        IN HIS 60s-METASTATIC  . Colon polyps Paternal Grandfather   . Breast cancer Mother   . Hypertension Father   . Heart disease Father   . Hypertension Brother   . Heart disease Brother     Prior to Admission medications   Medication Sig Start Date End Date Taking? Authorizing Provider  acetaminophen (TYLENOL) 325 MG tablet Take 650 mg by mouth every 6 (six)  hours as needed for mild pain.   Yes [provider]  albuterol (PROVENTIL) (2.5 MG/3ML) 0.083% nebulizer solution Take 3 mLs (2.5 mg total) by nebulization every 4 (four) hours as needed for wheezing. Patient taking differently: Take 2.5 mg by nebulization daily as needed for wheezing.  07/21/15  Yes Kathie Dike, MD  Biotin w/ Vitamins C & E (HAIR/SKIN/NAILS PO) Take 1 tablet by mouth daily.   Yes [provider]  Calcium Citrate-Vitamin D (CITRACAL PETITES/VITAMIN D) 200-250 MG-UNIT TABS Take 1 tablet by mouth daily.   Yes [provider]  dextromethorphan-guaiFENesin (MUCINEX DM) 30-600 MG 12hr tablet Take 1 tablet by mouth 2 (two) times daily as needed for cough.    Yes [provider]  diazepam (VALIUM) 5 MG tablet Take 5 mg by mouth daily as needed for anxiety or muscle spasms. For anxiety   Yes [provider]  docusate  sodium (COLACE) 100 MG capsule Take 100 mg by mouth at bedtime.    Yes [provider]  ferrous sulfate 325 (65 FE) MG tablet Take 325 mg by mouth daily with breakfast.   Yes [provider]  Lactobacillus (FLORAJEN ACIDOPHILUS PO) Take 1 capsule by mouth every morning.    Yes [provider]  lisinopril-hydrochlorothiazide (PRINZIDE,ZESTORETIC) 20-12.5 MG tablet 1 tablet daily. Reported on 08/03/2015 06/16/15  Yes [provider]  omeprazole (PRILOSEC) 20 MG capsule Take 20 mg by mouth 2 (two) times daily before a meal.   Yes [provider]  ondansetron (ZOFRAN) 4 MG tablet Take 4 mg by mouth every 8 (eight) hours as needed for nausea or vomiting.   Yes [provider]  potassium chloride (K-DUR) 10 MEQ tablet Take 10 mEq by mouth 3 (three) times daily with meals.    Yes [provider]  sertraline (ZOLOFT) 100 MG tablet Take 100 mg by mouth daily.   Yes [provider]  traZODone (DESYREL) 50 MG tablet Take 50 mg by mouth at bedtime.   Yes [provider]  vitamin B-12 (CYANOCOBALAMIN) 100 MCG tablet Take 500 mcg by mouth daily.    Yes [provider]    Physical Exam: Vitals:   02/05/17 1933 02/05/17 2000 02/05/17 2100 02/05/17 2159  BP: (!) 123/52 (!) 133/55 (!) 135/44 (!) 140/40  Pulse: 74 67 (!) 50 (!) 50  Resp: 18 14 17 17   Temp: 98.4 F (36.9 C)   98.2 F (36.8 C)  TempSrc: Oral   Oral  SpO2: 96% 96% 96% 97%  Weight:      Height:        Constitutional: NAD, calm, comfortable Eyes: PERRL, no icterus, lids and conjunctivae normal ENMT: Mucous membranes are moist. Posterior pharynx clear of any exudate or lesions. Absent dentition.  Neck: normal, supple, no masses, no thyromegaly Respiratory: clear to auscultation bilaterally, no wheezing, no crackles. Normal respiratory effort. No accessory muscle use.  Cardiovascular: Regular rate and rhythm, no murmurs / rubs / gallops. No extremity edema. 2+ pedal pulses. No carotid bruits.  Abdomen: Obese, soft, positive RUQ and epigastric tenderness, no guarding/rebound/masses palpated. No hepatosplenomegaly. Bowel sounds positive.  Musculoskeletal: no clubbing / cyanosis. Good ROM, no contractures. Normal muscle tone.  Skin: Multiple hyperpigmented macules on torso.  Several skin lesions on her left upper chest show signs of recent cryotherapy. Neurologic: CN 2-12 grossly intact. Sensation intact, DTR normal. Strength 5/5 in all 4.  Psychiatric: Normal judgment and insight. Alert and oriented x 4 normal mood.    Labs on Admission: I have personally reviewed following labs and imaging studies  CBC: Recent Labs  Lab 02/05/17 1446  WBC 5.4  NEUTROABS 4.2  HGB 12.8  HCT 40.6  MCV 97.4  PLT 474*   Basic Metabolic Panel: Recent Labs  Lab 02/05/17 1446 02/05/17 1959  NA 140  --   K 3.7  --   CL 107  --   CO2 26  --   GLUCOSE 153*  --   BUN 23*  --   CREATININE 1.06*  --   CALCIUM 9.0  --   MG  --  1.6*   GFR: Estimated Creatinine Clearance: 52.4  mL/min (A) (by C-G formula based on SCr of 1.06 mg/dL (H)). Liver Function Tests: Recent Labs  Lab 02/05/17 1446  AST 645*  ALT 242*  ALKPHOS 357*  BILITOT 1.6*  PROT 6.6  ALBUMIN 4.1   Recent Labs  Lab 02/05/17 1446  LIPASE 24   No results for input(s): AMMONIA in the last 168 hours. Coagulation Profile: Recent Labs  Lab 02/05/17 1648  INR 1.11   Cardiac Enzymes: No results for input(s): CKTOTAL, CKMB, CKMBINDEX, TROPONINI in the last 168 hours. BNP (last 3 results) No results for input(s): PROBNP in the last 8760 hours. HbA1C: No results for input(s): HGBA1C in the last 72 hours. CBG: No results for input(s): GLUCAP in the last 168 hours. Lipid Profile: No results for input(s): CHOL, HDL, LDLCALC, TRIG, CHOLHDL, LDLDIRECT in the last 72 hours. Thyroid Function Tests: No results for input(s): TSH, T4TOTAL, FREET4, T3FREE, THYROIDAB in the last 72 hours. Anemia Panel: No results for input(s): VITAMINB12, FOLATE, FERRITIN, TIBC, IRON, RETICCTPCT in the last 72 hours. Urine analysis:    Component Value Date/Time   COLORURINE YELLOW 02/05/2017 1446   APPEARANCEUR CLEAR 02/05/2017 1446   LABSPEC 1.016 02/05/2017 1446   PHURINE 5.0 02/05/2017 1446   GLUCOSEU NEGATIVE 02/05/2017 1446   HGBUR NEGATIVE 02/05/2017 1446   BILIRUBINUR NEGATIVE 02/05/2017 1446   KETONESUR NEGATIVE 02/05/2017 1446   PROTEINUR NEGATIVE 02/05/2017 1446   UROBILINOGEN 0.2 12/28/2013 1310   NITRITE NEGATIVE 02/05/2017 1446   LEUKOCYTESUR MODERATE (A) 02/05/2017 1446    Radiological Exams on Admission: Ct Abdomen Pelvis W Contrast  Result Date: 02/05/2017 CLINICAL DATA:  76 year old female post gastric bypass procedure several years ago. Stomach problems over the past 5 years. Nausea and vomiting since this morning. Prior cholecystectomy appendectomy and hysterectomy. Initial encounter. EXAM: CT ABDOMEN AND PELVIS WITH CONTRAST TECHNIQUE: Multidetector CT imaging of the abdomen and pelvis was  performed using the standard protocol following bolus administration of intravenous contrast. CONTRAST:  154mL ISOVUE-300 IOPAMIDOL (ISOVUE-300) INJECTION 61% COMPARISON:  05/29/2015 CT. FINDINGS: Lower chest: Minimal basilar scarring. Left base calcified granuloma. Heart size top-normal. Hepatobiliary: Top-normal size liver without focal worrisome mass. Post cholecystectomy. Mild prominence of intrahepatic and extrahepatic biliary ducts may represent a normal finding in a patient of this age who is post cholecystectomy (assuming normal liver enzymes). Pancreas: Fatty infiltration of the pancreas without pancreatic mass or inflammation. Spleen: Splenomegaly with spleen spanning over 13.5 cm. Tiny calcified granulomas. Prominent splenic vessels. Adrenals/Urinary Tract: No obstructing stone or hydronephrosis. No worrisome renal or adrenal mass. Evaluation urinary bladder limited by streak artifact from hip replacements. Stomach/Bowel: Post gastric bypass procedure. Circumferential thickening distal esophagus. Although under distension may partially explain this, underlying mass or result of esophagitis cannot be excluded. No findings of internal hernia as can be seen post gastric bypass. Evaluation of bowel within the pelvis limited by streak artifact from hip replacements. Scattered colonic diverticula without extraluminal bowel inflammatory process identified. Vascular/Lymphatic: Tortuous lower thoracic aorta with prominent fold. Very mild calcified plaque abdominal aorta without aneurysm or large vessel occlusion. No adenopathy. Reproductive: Post hysterectomy.  No obvious adnexal mass. Other: 2 anterior abdominal wall fat and vessel containing hernias. No bowel containing hernia. No free air. Musculoskeletal: Post bilateral hip replacements causing significant artifact. Fusion lower thoracic and upper lumbar spine. Kyphosis in this region. Degenerative changes L2-3 through L5-S1. IMPRESSION: Circumferential  thickening distal esophagus. Although under distension may partially explain this, underlying mass or result of esophagitis cannot be excluded. No findings of internal hernia as can be seen post gastric bypass. Evaluation of bowel within the pelvis limited by streak artifact from hip replacements. Scattered colonic diverticula without extraluminal bowel inflammatory process identified. 2 anterior abdominal wall fat and vessel containing hernias. No bowel containing hernia.  Post cholecystectomy. Mild prominence of intrahepatic and extrahepatic biliary ducts may represent a normal finding in a patient of this age who is post cholecystectomy (assuming normal liver enzymes). Splenomegaly with spleen spanning over 13.5 cm similar to prior exam. Aortic Atherosclerosis (ICD10-I70.0). Post hysterectomy. Post bilateral hip replacement. Degenerative change lower thoracic lumbar spine. Electronically Signed   By: Genia Del M.D.   On: 02/05/2017 18:30   11/04/2013 upper endoscopy  1. 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 AND NON-ADHERENCE TO GASTRIC BYPASS DIET 2. MILD Non-erosive gastritis  11/04/2013 colonoscopy  1. Normal mucosa in the terminal ileum 2. 13 COLON POLYPS REMOVED 3. Moderate diverticulosis in the descending colon and sigmoid colon 4. The LEFT colon IS redundant 5. Small internal hemorrhoids  EKG: Independently reviewed. Vent. rate 55 BPM PR interval * ms QRS duration 93 ms QT/QTc 414/396 ms P-R-T axes 38 -13 27 Sinus rhythm Abnormal R-wave progression, early transition Left ventricular hypertrophy Borderline abnrm T, anterolateral leads  Assessment/Plan Principal Problem:   Cholestasis   Transaminitis Observation/telemetry. N.p.o. after midnight. Continue IV fluids. Morphine 4 mg IVP every 3 hours as needed for pain. Ondansetron 4 mg IVP every 6 hours as needed for nausea/emesis. Protonix 40 mg IVP every 12 hours. Follow-up CBC and CMP in the morning. Check  ultrasound of right upper quadrant. GI to evaluate in a.m. Will probably need MRCP versus ERCP.  Active Problems:   Nausea and emesis Continue IV hydration. Antiemetics as needed.    Abdominal pain Likely secondary to cholestasis. Continue analgesics as needed.    GERD (gastroesophageal reflux disease) Protonix 40 mg IVP every 12 hours.    Bradycardia Initially was bradycardic.  Vasovagal? Monitor heart rate and rhythm with overnight telemetry.    Hypertension Continue lisinopril 20 mg p.o. daily. Hold hydrochlorothiazide for 24-48 hours. Monitor blood pressure, renal function and electrolytes.    Anxiety/Depression Continue sertraline 100 mg p.o. daily. Continue trazodone 50 mg p.o. at bedtime. Continue volume 5 mg p.o. daily as needed for anxiety or muscle spasms.    Hypomagnesemia Replaced. Follow-up level as needed. The patient may benefit from regular magnesium supplementation.   DVT prophylaxis: SCDs.. Code Status: Full code. Family Communication:  Disposition Plan: Observation for IV hydration, symptoms management and GI evaluation in a.m. Consults called: Dr. Manus Rudd (GI) was consulted by the ED. Admission status: Observation/telemetry.   Reubin Milan MD [facility] Pager [phone].  If 7PM-7AM, please contact night-coverage [email or web address] Password [email or web address]  02/05/2017, 10:25 PM

## 2017-02-05 NOTE — Progress Notes (Signed)
Pt refusing SCDs stating she had to have skin grafts from the SCDs. Pt states the SCDs busted her legs and she couldn't get them to heal and she has been afraid of them since.  Pt educated on reasoning for SCDs, states she understands but she is still refusing. Will continue to monitor pt

## 2017-02-05 NOTE — ED Provider Notes (Signed)
[facility] EMERGENCY DEPARTMENT Provider Note   CSN: [id number] Arrival date & time: 02/05/17  1424     History   Chief Complaint Chief Complaint  Patient presents with  . Nausea    HPI RIDA LOUDIN is a 76 y.o. female.  HPI complains of upper abdominal pain, vomiting more than 25 times and nausea onset 7:30 AM today.  She is vomiting clear material.  Last bowel movement was this morning, black.  She reports that she frequently has black stools as she takes iron supplements nothing makes symptoms better or worse.  Pain gradual in onset no fever.  No other associated symptoms.  No treatment prior to coming here Past Medical History:  Diagnosis Date  . Anemia   . Anxiety   . Arthritis   . Depression   . GERD (gastroesophageal reflux disease)   . H/O hiatal hernia   . Hypertension   . Obesity   . Pneumonia   . PONV (postoperative nausea and vomiting)   . Seasonal allergies   . Sepsis ([place]) 10/02/2013    Patient Active Problem List   Diagnosis Date Noted  . Transaminitis 07/08/2016  . Constipation 07/08/2016  . GIB (gastrointestinal bleeding) 07/08/2016  . HCAP (healthcare-associated pneumonia) 09/23/2015  . AKI (acute kidney injury) ([place]) 07/21/2015  . Hypokalemia 07/21/2015  . CAP (community acquired pneumonia) 07/14/2015  . Abdominal pain 06/10/2014  . Folliculitis of perineum 02/24/2014  . Giant comedone 12/21/2013  . 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 Acrochordon 11/25/2013  . Dysphagia 10/29/2013  . Morbid obesity ([place]) 10/02/2013  . Hypertension   . GERD (gastroesophageal reflux disease)   . Anxiety   . Anemia of chronic disease   . 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 ABDOMINAL HYSTERECTOMY    . ABDOMINAL SURGERY    . APPENDECTOMY    . BACK SURGERY     lumbar x2 by Dr Arnoldo Morale  . CARPAL TUNNEL RELEASE Right 03/2014   Dr. Lorin Mercy  . CATARACT EXTRACTION W/PHACO  Left 08/06/2013   Procedure: CATARACT EXTRACTION PHACO AND INTRAOCULAR LENS PLACEMENT LEFT EYE;  Surgeon: Tonny Branch, MD;  Location: [facility];  Service: Ophthalmology;  Laterality: Left;  CDE: 9.55  . CATARACT EXTRACTION W/PHACO Right 08/24/2013   Procedure: CATARACT EXTRACTION PHACO AND INTRAOCULAR LENS PLACEMENT (IOC);  Surgeon: Tonny Branch, MD;  Location: [facility];  Service: Ophthalmology;  Laterality: Right;  CDE:8.30  . CERVICAL FUSION    . CHOLECYSTECTOMY    . COLONOSCOPY N/A 11/03/2013   SLF: 1. Normal mucosa in the terminal ileum. 2. 13 colon polyps removed. 3. Moderate diverticulosis in the descending colon and sigmoid colon 4. the left colon is redundant 5. Small internal  hemorrhoids.  . ESOPHAGOGASTRODUODENOSCOPY N/A 11/03/2013   SLF: 1. 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 Mild Non-erosive gastritis  . EYE SURGERY    . GASTRIC BYPASS  1979   revision in 1980  . HIATAL HERNIA REPAIR     [facility] in 2011  . JOINT REPLACEMENT Right 1995   hip  . JOINT REPLACEMENT Left 2008   hip  . MEDIAL PARTIAL KNEE REPLACEMENT Left    Dr Ronnie Derby  . PARAESOPHAGEAL HERNIA REPAIR  SEP 2010 DR. MCNATT  . SHOULDER ARTHROSCOPY Right   . SHOULDER ARTHROSCOPY Right    x2  . SKIN BIOPSY    . TONSILLECTOMY    .  TOTAL SHOULDER ARTHROPLASTY Right 02/16/2013   Procedure: TOTAL SHOULDER ARTHROPLASTY;  Surgeon: Marybelle Killings, MD;  Location: [facility];  Service: Orthopedics;  Laterality: Right;  Right Total Shoulder Arthroplasty, Cemented    OB History    No data available       Home Medications    Prior to Admission medications   Medication Sig Start Date End Date Taking? Authorizing Provider  acetaminophen (TYLENOL) 325 MG tablet Take 650 mg by mouth every 6 (six) hours as needed for mild pain.    [provider]  albuterol (PROVENTIL) (2.5 MG/3ML) 0.083% nebulizer solution Take 3 mLs (2.5 mg total) by nebulization every 4 (four) hours as needed  for wheezing. 07/21/15   Kathie Dike, MD  Calcium Citrate-Vitamin D (CITRACAL PETITES/VITAMIN D) 200-250 MG-UNIT TABS Take 1 tablet by mouth daily.    [provider]  dextromethorphan-guaiFENesin (MUCINEX DM) 30-600 MG 12hr tablet Take 1 tablet by mouth 2 (two) times daily.     [provider]  diazepam (VALIUM) 5 MG tablet Take 5 mg by mouth 3 (three) times daily as needed. For anxiety    [provider]  docusate sodium (COLACE) 100 MG capsule Take 100 mg by mouth at bedtime.     [provider]  guaifenesin (ROBITUSSIN) 100 MG/5ML syrup Take 200 mg by mouth 3 (three) times daily as needed for cough.    [provider]  hydrocortisone-pramoxine (PROCTOFOAM HC) rectal foam Place 1 applicator rectally 2 (two) times daily. 02/15/16   Setzer, Rona Ravens, NP  iron polysaccharides (NIFEREX) 150 MG capsule Take 1 capsule (150 mg total) by mouth 2 (two) times daily. Patient taking differently: Take 325 mg by mouth daily.  10/03/13   Kathie Dike, MD  Lactobacillus (FLORAJEN ACIDOPHILUS PO) Take by mouth.    [provider]  lisinopril-hydrochlorothiazide (PRINZIDE,ZESTORETIC) 20-12.5 MG tablet 1 tablet daily. Reported on 08/03/2015 06/16/15   [provider]  omeprazole (PRILOSEC) 20 MG capsule Take 20 mg by mouth 2 (two) times daily before a meal.    [provider]  ondansetron (ZOFRAN) 4 MG tablet Take 4 mg by mouth every 8 (eight) hours as needed for nausea or vomiting.    [provider]  potassium chloride (K-DUR) 10 MEQ tablet Take 10 mEq by mouth 3 (three) times daily with meals.     [provider]  sertraline (ZOLOFT) 100 MG tablet Take 100 mg by mouth daily.    [provider]  traZODone (DESYREL) 50 MG tablet Take 50 mg by mouth at bedtime.    [provider]  vitamin B-12 (CYANOCOBALAMIN) 100 MCG tablet Take 500 mcg by mouth daily.     [provider]    Family  History Family History  Problem Relation Age of Onset  . Colon cancer Brother        IN HIS 60s-METASTATIC  . Hypertension Brother   . Colon polyps Paternal Grandfather   . Breast cancer Mother   . Hypertension Father     Social History Social History   Tobacco Use  . Smoking status: Never Smoker  . Smokeless tobacco: Never Used  Substance Use Topics  . Alcohol use: No  . Drug use: No     Allergies   Novocain [procaine hcl]; Other; Latex; Penicillins; and Sulfa antibiotics   Review of Systems Review of Systems  Gastrointestinal: Positive for abdominal pain and vomiting.  All other systems reviewed and are negative.    Physical Exam Updated  Vital Signs BP 120/86 (BP Location: Left Arm)   Pulse 83   Temp 98.2 F (36.8 C) (Oral)   Resp 18   Ht 5\' 3"  (1.6 m)   Wt 102.5 kg (226 lb)   SpO2 95%   BMI 40.03 kg/m   Physical Exam  Constitutional: She appears well-developed.  Chronically ill-appearing  HENT:  Head: Normocephalic and atraumatic.  Eyes: Conjunctivae are normal. Pupils are equal, round, and reactive to light.  Neck: Neck supple. No tracheal deviation present. No thyromegaly present.  Cardiovascular: Normal rate and regular rhythm.  No murmur heard. Pulmonary/Chest: Effort normal and breath sounds normal.  Abdominal: Soft. Bowel sounds are normal. She exhibits no distension. There is no tenderness.  Morbidly obese, diffuse tenderness  Genitourinary: Rectal exam shows guaiac negative stool.  Genitourinary Comments: Normal tone brown stool Hemoccult negative  Musculoskeletal: Normal range of motion. She exhibits no edema or tenderness.  Neurological: She is alert. Coordination normal.  Skin: Skin is warm and dry. Capillary refill takes less than 2 seconds. No rash noted.  Psychiatric: She has a normal mood and affect.  Nursing note and vitals reviewed.    ED Treatments / Results  Labs (all labs ordered are listed, but only abnormal results are  displayed) Labs Reviewed  CBC WITH DIFFERENTIAL/PLATELET - Abnormal; Notable for the following components:      Result Value   Platelets 112 (*)    All other components within normal limits  COMPREHENSIVE METABOLIC PANEL - Abnormal; Notable for the following components:   Glucose, Bld 153 (*)    BUN 23 (*)    Creatinine, Ser 1.06 (*)    AST 645 (*)    ALT 242 (*)    Alkaline Phosphatase 357 (*)    Total Bilirubin 1.6 (*)    GFR calc non Af Amer 50 (*)    GFR calc Af Amer 58 (*)    All other components within normal limits  URINALYSIS, ROUTINE W REFLEX MICROSCOPIC - Abnormal; Notable for the following components:   Leukocytes, UA MODERATE (*)    Bacteria, UA RARE (*)    All other components within normal limits  LIPASE, BLOOD  PROTIME-INR  POC OCCULT BLOOD, ED    EKG  EKG Interpretation  Date/Time:  Tuesday February 05 2017 14:36:46 EST Ventricular Rate:  55 PR Interval:    QRS Duration: 93 QT Interval:  414 QTC Calculation: 396 R Axis:   -13 Text Interpretation:  Sinus rhythm Abnormal R-wave progression, early transition Left ventricular hypertrophy Borderline abnrm T, anterolateral leads Since last tracing rate slower Reconfirmed by Orlie Dakin [phone]) on 02/05/2017 4:33:08 PM      Results for orders placed or performed during the hospital encounter of 02/05/17  CBC with Differential  Result Value Ref Range   WBC 5.4 4.0 - 10.5 K/uL   RBC 4.17 3.87 - 5.11 MIL/uL   Hemoglobin 12.8 12.0 - 15.0 g/dL   HCT 40.6 36.0 - 46.0 %   MCV 97.4 78.0 - 100.0 fL   MCH 30.7 26.0 - 34.0 pg   MCHC 31.5 30.0 - 36.0 g/dL   RDW 14.4 11.5 - 15.5 %   Platelets 112 (L) 150 - 400 K/uL   Neutrophils Relative % 77 %   Neutro Abs 4.2 1.7 - 7.7 K/uL   Lymphocytes Relative 19 %   Lymphs Abs 1.0 0.7 - 4.0 K/uL   Monocytes Relative 2 %   Monocytes Absolute 0.1 0.1 - 1.0 K/uL   Eosinophils  Relative 1 %   Eosinophils Absolute 0.1 0.0 - 0.7 K/uL   Basophils Relative 0 %   Basophils  Absolute 0.0 0.0 - 0.1 K/uL  Comprehensive metabolic panel  Result Value Ref Range   Sodium 140 135 - 145 mmol/L   Potassium 3.7 3.5 - 5.1 mmol/L   Chloride 107 101 - 111 mmol/L   CO2 26 22 - 32 mmol/L   Glucose, Bld 153 (H) 65 - 99 mg/dL   BUN 23 (H) 6 - 20 mg/dL   Creatinine, Ser 1.06 (H) 0.44 - 1.00 mg/dL   Calcium 9.0 8.9 - 10.3 mg/dL   Total Protein 6.6 6.5 - 8.1 g/dL   Albumin 4.1 3.5 - 5.0 g/dL   AST 645 (H) 15 - 41 U/L   ALT 242 (H) 14 - 54 U/L   Alkaline Phosphatase 357 (H) 38 - 126 U/L   Total Bilirubin 1.6 (H) 0.3 - 1.2 mg/dL   GFR calc non Af Amer 50 (L) >60 mL/min   GFR calc Af Amer 58 (L) >60 mL/min   Anion gap 7 5 - 15  Urinalysis, Routine w reflex microscopic  Result Value Ref Range   Color, Urine YELLOW YELLOW   APPearance CLEAR CLEAR   Specific Gravity, Urine 1.016 1.005 - 1.030   pH 5.0 5.0 - 8.0   Glucose, UA NEGATIVE NEGATIVE mg/dL   Hgb urine dipstick NEGATIVE NEGATIVE   Bilirubin Urine NEGATIVE NEGATIVE   Ketones, ur NEGATIVE NEGATIVE mg/dL   Protein, ur NEGATIVE NEGATIVE mg/dL   Nitrite NEGATIVE NEGATIVE   Leukocytes, UA MODERATE (A) NEGATIVE   RBC / HPF 0-5 0 - 5 RBC/hpf   WBC, UA 6-30 0 - 5 WBC/hpf   Bacteria, UA RARE (A) NONE SEEN   Squamous Epithelial / LPF NONE SEEN NONE SEEN   Mucus PRESENT   Lipase, blood  Result Value Ref Range   Lipase 24 11 - 51 U/L  Protime-INR  Result Value Ref Range   Prothrombin Time 14.2 11.4 - 15.2 seconds   INR 1.11   POC occult blood, ED Provider will collect  Result Value Ref Range   Fecal Occult Bld NEGATIVE NEGATIVE   Ct Abdomen Pelvis W Contrast  Result Date: 02/05/2017 CLINICAL DATA:  76 year old female post gastric bypass procedure several years ago. Stomach problems over the past 5 years. Nausea and vomiting since this morning. Prior cholecystectomy appendectomy and hysterectomy. Initial encounter. EXAM: CT ABDOMEN AND PELVIS WITH CONTRAST TECHNIQUE: Multidetector CT imaging of the abdomen and pelvis  was performed using the standard protocol following bolus administration of intravenous contrast. CONTRAST:  121mL ISOVUE-300 IOPAMIDOL (ISOVUE-300) INJECTION 61% COMPARISON:  05/29/2015 CT. FINDINGS: Lower chest: Minimal basilar scarring. Left base calcified granuloma. Heart size top-normal. Hepatobiliary: Top-normal size liver without focal worrisome mass. Post cholecystectomy. Mild prominence of intrahepatic and extrahepatic biliary ducts may represent a normal finding in a patient of this age who is post cholecystectomy (assuming normal liver enzymes). Pancreas: Fatty infiltration of the pancreas without pancreatic mass or inflammation. Spleen: Splenomegaly with spleen spanning over 13.5 cm. Tiny calcified granulomas. Prominent splenic vessels. Adrenals/Urinary Tract: No obstructing stone or hydronephrosis. No worrisome renal or adrenal mass. Evaluation urinary bladder limited by streak artifact from hip replacements. Stomach/Bowel: Post gastric bypass procedure. Circumferential thickening distal esophagus. Although under distension may partially explain this, underlying mass or result of esophagitis cannot be excluded. No findings of internal hernia as can be seen post gastric bypass. Evaluation of bowel within the pelvis limited by  streak artifact from hip replacements. Scattered colonic diverticula without extraluminal bowel inflammatory process identified. Vascular/Lymphatic: Tortuous lower thoracic aorta with prominent fold. Very mild calcified plaque abdominal aorta without aneurysm or large vessel occlusion. No adenopathy. Reproductive: Post hysterectomy.  No obvious adnexal mass. Other: 2 anterior abdominal wall fat and vessel containing hernias. No bowel containing hernia. No free air. Musculoskeletal: Post bilateral hip replacements causing significant artifact. Fusion lower thoracic and upper lumbar spine. Kyphosis in this region. Degenerative changes L2-3 through L5-S1. IMPRESSION: Circumferential  thickening distal esophagus. Although under distension may partially explain this, underlying mass or result of esophagitis cannot be excluded. No findings of internal hernia as can be seen post gastric bypass. Evaluation of bowel within the pelvis limited by streak artifact from hip replacements. Scattered colonic diverticula without extraluminal bowel inflammatory process identified. 2 anterior abdominal wall fat and vessel containing hernias. No bowel containing hernia. Post cholecystectomy. Mild prominence of intrahepatic and extrahepatic biliary ducts may represent a normal finding in a patient of this age who is post cholecystectomy (assuming normal liver enzymes). Splenomegaly with spleen spanning over 13.5 cm similar to prior exam. Aortic Atherosclerosis (ICD10-I70.0). Post hysterectomy. Post bilateral hip replacement. Degenerative change lower thoracic lumbar spine. Electronically Signed   By: Genia Del M.D.   On: 02/05/2017 18:30   Radiology No results found.  Procedures Procedures (including critical care time)  Medications Ordered in ED Medications  morphine 4 MG/ML injection 4 mg (not administered)  metoCLOPramide (REGLAN) injection 5 mg (not administered)  sodium chloride 0.9 % bolus 1,000 mL (not administered)  ondansetron (ZOFRAN) injection 4 mg (4 mg Intravenous Given 02/05/17 1500)     Initial Impression / Assessment and Plan / ED Course  I have reviewed the triage vital signs and the nursing notes.  Pertinent labs & imaging results that were available during my care of the patient were reviewed by me and considered in my medical decision making (see chart for details).     7 PM pain and nausea much improved after treatment with intravenous opioids and antiemetics and hydration with intravenous normal saline.  She is able to drink ice water without vomiting.  I consulted Dr.Rourk via telephone who requests PPI, clear liquid diet n.p.o. after midnight. IV Protonix ordered  by me overnight stay to be arranged by hospitalist concern for common duct stone. She was clinically dehydrated upon arrival here.Dr Melvyn Novas consulted and will arrange forovernight stay Final Clinical Impressions(s) / ED Diagnoses  Diagnoses #1 upper abdominal pain #2 elevated liver function tests #3 nausea and vomiting #4 dehydration Final diagnoses:  None    ED Discharge Orders    None       Orlie Dakin, MD 02/05/17 1934

## 2017-02-05 NOTE — ED Triage Notes (Signed)
Pt c/o n/v since 7am this morning after eating breakfast.  C/o abd sore.  Pt had gastric bypass surgery several years ago but reports has been having some stomach problems over the past 5 years. Denies diarrhea, lbm was yesterday.

## 2017-02-06 ENCOUNTER — Observation Stay (HOSPITAL_COMMUNITY): Payer: Medicare Other

## 2017-02-06 ENCOUNTER — Encounter (HOSPITAL_COMMUNITY): Payer: Self-pay | Admitting: Gastroenterology

## 2017-02-06 DIAGNOSIS — R933 Abnormal findings on diagnostic imaging of other parts of digestive tract: Secondary | ICD-10-CM | POA: Diagnosis not present

## 2017-02-06 DIAGNOSIS — K831 Obstruction of bile duct: Secondary | ICD-10-CM | POA: Diagnosis not present

## 2017-02-06 DIAGNOSIS — R945 Abnormal results of liver function studies: Secondary | ICD-10-CM | POA: Diagnosis not present

## 2017-02-06 DIAGNOSIS — K8050 Calculus of bile duct without cholangitis or cholecystitis without obstruction: Secondary | ICD-10-CM | POA: Diagnosis not present

## 2017-02-06 DIAGNOSIS — R001 Bradycardia, unspecified: Secondary | ICD-10-CM | POA: Diagnosis not present

## 2017-02-06 DIAGNOSIS — E876 Hypokalemia: Secondary | ICD-10-CM | POA: Diagnosis not present

## 2017-02-06 DIAGNOSIS — R740 Nonspecific elevation of levels of transaminase and lactic acid dehydrogenase [LDH]: Secondary | ICD-10-CM | POA: Diagnosis not present

## 2017-02-06 DIAGNOSIS — R1010 Upper abdominal pain, unspecified: Secondary | ICD-10-CM

## 2017-02-06 LAB — CK: Total CK: 52 U/L (ref 38–234)

## 2017-02-06 LAB — CBC WITH DIFFERENTIAL/PLATELET
Basophils Absolute: 0 10*3/uL (ref 0.0–0.1)
Basophils Relative: 0 %
Eosinophils Absolute: 0.1 10*3/uL (ref 0.0–0.7)
Eosinophils Relative: 2 %
HCT: 36.9 % (ref 36.0–46.0)
Hemoglobin: 11.7 g/dL — ABNORMAL LOW (ref 12.0–15.0)
Lymphocytes Relative: 17 %
Lymphs Abs: 0.6 10*3/uL — ABNORMAL LOW (ref 0.7–4.0)
MCH: 30.8 pg (ref 26.0–34.0)
MCHC: 31.7 g/dL (ref 30.0–36.0)
MCV: 97.1 fL (ref 78.0–100.0)
Monocytes Absolute: 0.5 10*3/uL (ref 0.1–1.0)
Monocytes Relative: 15 %
Neutro Abs: 2.5 10*3/uL (ref 1.7–7.7)
Neutrophils Relative %: 67 %
Platelets: 119 10*3/uL — ABNORMAL LOW (ref 150–400)
RBC: 3.8 MIL/uL — ABNORMAL LOW (ref 3.87–5.11)
RDW: 14.5 % (ref 11.5–15.5)
WBC: 3.7 10*3/uL — ABNORMAL LOW (ref 4.0–10.5)

## 2017-02-06 LAB — BASIC METABOLIC PANEL
Anion gap: 6 (ref 5–15)
BUN: 15 mg/dL (ref 6–20)
CO2: 26 mmol/L (ref 22–32)
Calcium: 8.4 mg/dL — ABNORMAL LOW (ref 8.9–10.3)
Chloride: 105 mmol/L (ref 101–111)
Creatinine, Ser: 0.92 mg/dL (ref 0.44–1.00)
GFR calc Af Amer: >60 mL/min (ref >60)
GFR calc non Af Amer: 59 mL/min — ABNORMAL LOW (ref >60)
Glucose, Bld: 103 mg/dL — ABNORMAL HIGH (ref 65–99)
Potassium: 3.5 mmol/L (ref 3.5–5.1)
Sodium: 137 mmol/L (ref 135–145)

## 2017-02-06 LAB — HEPATIC FUNCTION PANEL
ALT: 357 U/L — ABNORMAL HIGH (ref 14–54)
AST: 482 U/L — ABNORMAL HIGH (ref 15–41)
Albumin: 3.4 g/dL — ABNORMAL LOW (ref 3.5–5.0)
Alkaline Phosphatase: 332 U/L — ABNORMAL HIGH (ref 38–126)
Bilirubin, Direct: 0.9 mg/dL — ABNORMAL HIGH (ref 0.1–0.5)
Indirect Bilirubin: 0.5 mg/dL (ref 0.3–0.9)
Total Bilirubin: 1.4 mg/dL — ABNORMAL HIGH (ref 0.3–1.2)
Total Protein: 5.6 g/dL — ABNORMAL LOW (ref 6.5–8.1)

## 2017-02-06 LAB — PROTIME-INR
INR: 1.16
Prothrombin Time: 14.7 seconds (ref 11.4–15.2)

## 2017-02-06 MED ORDER — GADOBENATE DIMEGLUMINE 529 MG/ML IV SOLN
20.0000 mL | Freq: Once | INTRAVENOUS | Status: AC | PRN
  Administered 2017-02-06: 20 mL via INTRAVENOUS

## 2017-02-06 MED ORDER — HEPARIN SODIUM (PORCINE) 5000 UNIT/ML IJ SOLN
5000.0000 [IU] | Freq: Three times a day (TID) | INTRAMUSCULAR | Status: DC
  Administered 2017-02-06 – 2017-02-09 (×8): 5000 [IU] via SUBCUTANEOUS
  Filled 2017-02-06 (×8): qty 1

## 2017-02-06 MED ORDER — IBUPROFEN 400 MG PO TABS
400.0000 mg | ORAL_TABLET | Freq: Four times a day (QID) | ORAL | Status: DC | PRN
  Administered 2017-02-06 – 2017-02-09 (×5): 400 mg via ORAL
  Filled 2017-02-06 (×5): qty 1

## 2017-02-06 NOTE — Consult Note (Signed)
Referring Provider: Louellen Molder, MD Primary Care Physician:  [name], MD Primary Gastroenterologist:  Dr. Hildred Laser (patient transferred care from walking gastro-and neurology in 2016 to [facility] in a subsequently establish care with Dr. Laural Golden in 2017).  Reason for Consultation:  Cholestasis, N/V, abdominal pain  HPI: [name] [name] is a 76 y.o. female with h/o remote gastric bypass presented to ED with complaints of ruq/epigastric pain associated with N/V beginning morning of admission. Noted to have total bilirubin 1.6, alkaline phosphatase 357, AST 645, ALT 242.  Lipase normal.  Platelets 104,000, white blood cell count normal.  Hemoccult negative.  Today her alkaline phosphatase is 332, AST down to 482, ALT up to 357.  Patient has h/o markedly elevated LFTs first noted back in May 2018. Admitted with limited work up and discharged within 48 hours with dramatic improvement in numbers (AST 740 down to 249, ALT 429 down to 249, AP 352 to 287).Patient had abdominal u/s in 07/2016 s/p cholecystectomy, CBD 75mm, diffuse increased echogenicity. Has been seeing Deberah Castle, NP with Dr. Laural Golden for work up which has included extensive serologies and liver biopsy (LIVER WITH MINIMAL PERIPORTAL INFLAMMATION AND FIBROSIS (STAGE 1 OF 4)).    This episode similar to ones in the past. Complains of onset of vomiting and then with abdominal discomfort she feels is due to heaving. Last emesis was this morning. No diarrhea. No melena, brbpr. Heartburn well controlled on meds. Weight has been stable for years.  CT A/P with contrast this admission.  Mild prominence of the intrahepatic and extra hepatic biliary ducts status post cholecystectomy.  Fatty infiltration of the pancreas.  No mass.  Splenomegaly.  Circumferential thickening distal esophagus although underdistention may partially explain this, underlying mass or esophagitis cannot be excluded.  No evidence of internal hernias can be seen status post  gastric bypass.  Evaluation of bowel within the pelvis limited by streak artifact from hip replacements.  2 anterior abdominal wall fat vessel hernias.  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 Alvan Dame) - likely Barretts esophagus, biopsied. - hiatal hernia - post-bypass anatomy.   Prior to Admission medications   Medication Sig Start Date End Date Taking? Authorizing Provider  acetaminophen (TYLENOL) 325 MG tablet Take 650 mg by mouth every 6 (six) hours as needed for mild pain.   Yes [provider]  albuterol (PROVENTIL) (2.5 MG/3ML) 0.083% nebulizer solution Take 3 mLs (2.5 mg total) by nebulization every 4 (four) hours as needed for wheezing. Patient taking differently: Take 2.5 mg by nebulization daily as needed for wheezing.  07/21/15  Yes Kathie Dike, MD  Biotin w/ Vitamins C & E (HAIR/SKIN/NAILS PO) Take 1 tablet by mouth daily.   Yes [provider]  Calcium Citrate-Vitamin D (CITRACAL PETITES/VITAMIN D) 200-250 MG-UNIT TABS Take 1 tablet by mouth daily.   Yes [provider]  dextromethorphan-guaiFENesin (MUCINEX DM) 30-600 MG 12hr tablet Take 1 tablet by mouth 2 (two) times daily as needed for cough.    Yes [provider]  diazepam (VALIUM) 5 MG tablet Take 5 mg by mouth daily as needed for anxiety or muscle spasms. For anxiety   Yes [provider]  docusate sodium (COLACE) 100 MG capsule Take 100 mg by mouth at bedtime.    Yes [provider]  ferrous sulfate 325 (65 FE) MG tablet Take 325 mg by mouth daily with breakfast.  Yes [provider]  Lactobacillus (FLORAJEN ACIDOPHILUS PO) Take 1 capsule by mouth every morning.    Yes [provider]  lisinopril-hydrochlorothiazide (PRINZIDE,ZESTORETIC) 20-12.5 MG tablet 1 tablet daily. Reported on 08/03/2015 06/16/15  Yes [provider]   omeprazole (PRILOSEC) 20 MG capsule Take 20 mg by mouth 2 (two) times daily before a meal.   Yes [provider]  ondansetron (ZOFRAN) 4 MG tablet Take 4 mg by mouth every 8 (eight) hours as needed for nausea or vomiting.   Yes [provider]  potassium chloride (K-DUR) 10 MEQ tablet Take 10 mEq by mouth 3 (three) times daily with meals.    Yes [provider]  sertraline (ZOLOFT) 100 MG tablet Take 100 mg by mouth daily.   Yes [provider]  traZODone (DESYREL) 50 MG tablet Take 50 mg by mouth at bedtime.   Yes [provider]  vitamin B-12 (CYANOCOBALAMIN) 100 MCG tablet Take 500 mcg by mouth daily.    Yes [provider]    Current Facility-Administered Medications  Medication Dose Route Frequency Provider Last Rate Last Dose  . 0.45 % sodium chloride infusion   Intravenous Continuous Reubin Milan, MD 100 mL/hr at 02/05/17 2244    . acetaminophen (TYLENOL) tablet 650 mg  650 mg Oral Q6H PRN Reubin Milan, MD      . albuterol (PROVENTIL) (2.5 MG/3ML) 0.083% nebulizer solution 2.5 mg  2.5 mg Nebulization Q4H PRN Reubin Milan, MD      . diazepam (VALIUM) tablet 5 mg  5 mg Oral Daily PRN Reubin Milan, MD      . docusate sodium (COLACE) capsule 100 mg  100 mg Oral QHS Reubin Milan, MD   100 mg at 02/05/17 2245  . morphine 4 MG/ML injection 4 mg  4 mg Intravenous Q3H PRN Reubin Milan, MD   4 mg at 02/05/17 2245  . ondansetron (ZOFRAN) injection 4 mg  4 mg Intravenous Q6H PRN Reubin Milan, MD      . pantoprazole (PROTONIX) injection 40 mg  40 mg Intravenous Q12H Reubin Milan, MD      . sertraline (ZOLOFT) tablet 100 mg  100 mg Oral Daily Reubin Milan, MD      . traZODone (DESYREL) tablet 50 mg  50 mg Oral QHS Reubin Milan, MD   50 mg at 02/05/17 2244  . vitamin B-12 (CYANOCOBALAMIN) tablet 500 mcg  500 mcg Oral Daily Reubin Milan, MD        Allergies as of  02/05/2017 - Review Complete 02/05/2017  Allergen Reaction Noted  . Novocain [procaine hcl]  11/20/2011  . Other Hives 11/20/2011  . Latex Rash 07/22/2013  . Penicillins Rash 11/20/2011  . Sulfa antibiotics Rash 11/20/2011    Past Medical History:  Diagnosis Date  . Anemia   . Anxiety   . Arthritis   . Barrett's esophagus    [facility] on [facility]  . Depression   . GERD (gastroesophageal reflux disease)   . H/O hiatal hernia   . Hypertension   . Obesity   . Pneumonia   . PONV (postoperative nausea and vomiting)   . Seasonal allergies   . Sepsis ([place]) 10/02/2013    Past Surgical History:  Procedure Laterality Date  . ABDOMINAL HYSTERECTOMY    . ABDOMINAL SURGERY    . APPENDECTOMY    . BACK SURGERY     lumbar x2 by Dr Arnoldo Morale  .  CARPAL TUNNEL RELEASE Right 03/2014   Dr. Lorin Mercy  . CATARACT EXTRACTION W/PHACO Left 08/06/2013   Procedure: CATARACT EXTRACTION PHACO AND INTRAOCULAR LENS PLACEMENT LEFT EYE;  Surgeon: Tonny Branch, MD;  Location: [facility];  Service: Ophthalmology;  Laterality: Left;  CDE: 9.55  . CATARACT EXTRACTION W/PHACO Right 08/24/2013   Procedure: CATARACT EXTRACTION PHACO AND INTRAOCULAR LENS PLACEMENT (IOC);  Surgeon: Tonny Branch, MD;  Location: [facility];  Service: Ophthalmology;  Laterality: Right;  CDE:8.30  . CERVICAL FUSION    . CHOLECYSTECTOMY    . COLONOSCOPY N/A 11/03/2013   SLF: 1. Normal mucosa in the terminal ileum. 2. 13 colon polyps removed. 3. Moderate diverticulosis in the descending colon and sigmoid colon 4. the left colon is redundant 5. Small internal  hemorrhoids.  . ESOPHAGOGASTRODUODENOSCOPY N/A 11/03/2013   SLF: 1. 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 Mild Non-erosive gastritis  . 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 s/p gastric bypass  . EYE SURGERY     . GASTRIC BYPASS  1979   revision in 1980   . HIATAL HERNIA REPAIR     [facility] in 2011  . JOINT REPLACEMENT Right 1995   hip  . JOINT REPLACEMENT Left 2008   hip  . MEDIAL PARTIAL KNEE REPLACEMENT Left    Dr Ronnie Derby  . PARAESOPHAGEAL HERNIA REPAIR  SEP 2010 DR. MCNATT  . SHOULDER ARTHROSCOPY Right   . SHOULDER ARTHROSCOPY Right    x2  . SKIN BIOPSY    . TONSILLECTOMY    . TOTAL SHOULDER ARTHROPLASTY Right 02/16/2013   Procedure: TOTAL SHOULDER ARTHROPLASTY;  Surgeon: Marybelle Killings, MD;  Location: [place];  Service: Orthopedics;  Laterality: Right;  Right Total Shoulder Arthroplasty, Cemented    Family History  Problem Relation Age of Onset  . Colon cancer Brother        IN HIS 60s-METASTATIC  . Colon polyps Paternal Grandfather   . Breast cancer Mother   . Hypertension Father   . Heart disease Father   . Hypertension Brother   . Heart disease Brother     Social History   Socioeconomic History  . Marital status: Single    Spouse name: Not on file  . Number of children: Not on file  . Years of education: Not on file  . Highest education level: Not on file  Social Needs  . Financial resource strain: Not on file  . Food insecurity - worry: Not on file  . Food insecurity - inability: Not on file  . Transportation needs - medical: Not on file  . Transportation needs - non-medical: Not on file  Occupational History  . Not on file  Tobacco Use  . Smoking status: Never Smoker  . Smokeless tobacco: Never Used  Substance and Sexual Activity  . Alcohol use: No  . Drug use: No  . Sexual activity: Yes    Birth control/protection: Surgical  Other Topics Concern  . Not on file  Social History Narrative  . Not on file     ROS:  General: Negative for anorexia, weight loss, fever, chills, fatigue, weakness. Eyes: Negative for vision changes.  ENT: Negative for hoarseness, difficulty swallowing , nasal congestion. CV: Negative for chest pain, angina, palpitations,  dyspnea on exertion, peripheral edema.  Respiratory: Negative for dyspnea at rest, dyspnea on exertion, cough, sputum, wheezing.  GI: See  history of present illness. GU:  Negative for dysuria, hematuria, urinary incontinence, urinary frequency, nocturnal urination.  MS: Negative for joint pain, low back pain.  Derm: Negative for rash or itching.  Neuro: Negative for weakness, abnormal sensation, seizure, frequent headaches, memory loss, confusion.  Psych: Negative for anxiety, depression, suicidal ideation, hallucinations.  Endo: Negative for unusual weight change.  Heme: Negative for bruising or bleeding. Allergy: Negative for rash or hives.       Physical Examination: Vital signs in last 24 hours: Temp:  [98.2 F (36.8 C)-98.4 F (36.9 C)] 98.3 F (36.8 C) (12/05 0539) Pulse Rate:  [48-83] 60 (12/05 0539) Resp:  [14-19] 18 (12/05 0539) BP: (120-147)/(40-86) 147/48 (12/05 0539) SpO2:  [94 %-99 %] 94 % (12/05 0539) Weight:  [226 lb (102.5 kg)-228 lb 3.2 oz (103.5 kg)] 228 lb 3.2 oz (103.5 kg) (12/04 2159) Last BM Date: 02/04/17  General: Well-nourished, well-developed in no acute distress.  Head: Normocephalic, atraumatic.   Eyes: Conjunctiva pink, no icterus. Mouth: Oropharyngeal mucosa moist and pink , no lesions erythema or exudate. Neck: Supple without thyromegaly, masses, or lymphadenopathy.  Lungs: Clear to auscultation bilaterally.  Heart: Regular rate and rhythm, no murmurs rubs or gallops.  Abdomen: Bowel sounds are normal,  Tender in epigastric area and periumbilical. nondistended, no hepatosplenomegaly or masses, no abdominal bruits, anterior wall hernia, no rebound or guarding.   Rectal: not performed Extremities: No lower extremity edema, clubbing, deformity.  Neuro: Alert and oriented x 4 , grossly normal neurologically.  Skin: Warm and dry, no rash or jaundice.   Psych: Alert and cooperative, normal mood and affect.        Intake/Output from previous day: 12/04  0701 - 12/05 0700 In: 1781.7 [P.O.:240; I.V.:441.7; IV Piggyback:1100] Out: 501 [Urine:501] Intake/Output this shift: No intake/output data recorded.  Lab Results: CBC Recent Labs    02/05/17 1446 02/06/17 0556  WBC 5.4 3.7*  HGB 12.8 11.7*  HCT 40.6 36.9  MCV 97.4 97.1  PLT 112* 119*   BMET Recent Labs    02/05/17 1446 02/06/17 0556  NA 140 137  K 3.7 3.5  CL 107 105  CO2 26 26  GLUCOSE 153* 103*  BUN 23* 15  CREATININE 1.06* 0.92  CALCIUM 9.0 8.4*   LFT Recent Labs    02/05/17 1446 02/06/17 0556  BILITOT 1.6* 1.4*  BILIDIR  --  0.9*  IBILI  --  0.5  ALKPHOS 357* 332*  AST 645* 482*  ALT 242* 357*  PROT 6.6 5.6*  ALBUMIN 4.1 3.4*    Lipase Recent Labs    02/05/17 1446  LIPASE 24    PT/INR Recent Labs    02/05/17 1648 02/06/17 0556  LABPROT 14.2 14.7  INR 1.11 1.16      Imaging Studies: Ct Abdomen Pelvis W Contrast  Result Date: 02/05/2017 CLINICAL DATA:  76 year old female post gastric bypass procedure several years ago. Stomach problems over the past 5 years. Nausea and vomiting since this morning. Prior cholecystectomy appendectomy and hysterectomy. Initial encounter. EXAM: CT ABDOMEN AND PELVIS WITH CONTRAST TECHNIQUE: Multidetector CT imaging of the abdomen and pelvis was performed using the standard protocol following bolus administration of intravenous contrast. CONTRAST:  171mL ISOVUE-300 IOPAMIDOL (ISOVUE-300) INJECTION 61% COMPARISON:  05/29/2015 CT. FINDINGS: Lower chest: Minimal basilar scarring. Left base calcified granuloma. Heart size top-normal. Hepatobiliary: Top-normal size liver without focal worrisome mass. Post cholecystectomy. Mild prominence of intrahepatic and extrahepatic biliary ducts may represent a normal finding in a patient of this  age who is post cholecystectomy (assuming normal liver enzymes). Pancreas: Fatty infiltration of the pancreas without pancreatic mass or inflammation. Spleen: Splenomegaly with spleen spanning  over 13.5 cm. Tiny calcified granulomas. Prominent splenic vessels. Adrenals/Urinary Tract: No obstructing stone or hydronephrosis. No worrisome renal or adrenal mass. Evaluation urinary bladder limited by streak artifact from hip replacements. Stomach/Bowel: Post gastric bypass procedure. Circumferential thickening distal esophagus. Although under distension may partially explain this, underlying mass or result of esophagitis cannot be excluded. No findings of internal hernia as can be seen post gastric bypass. Evaluation of bowel within the pelvis limited by streak artifact from hip replacements. Scattered colonic diverticula without extraluminal bowel inflammatory process identified. Vascular/Lymphatic: Tortuous lower thoracic aorta with prominent fold. Very mild calcified plaque abdominal aorta without aneurysm or large vessel occlusion. No adenopathy. Reproductive: Post hysterectomy.  No obvious adnexal mass. Other: 2 anterior abdominal wall fat and vessel containing hernias. No bowel containing hernia. No free air. Musculoskeletal: Post bilateral hip replacements causing significant artifact. Fusion lower thoracic and upper lumbar spine. Kyphosis in this region. Degenerative changes L2-3 through L5-S1. IMPRESSION: Circumferential thickening distal esophagus. Although under distension may partially explain this, underlying mass or result of esophagitis cannot be excluded. No findings of internal hernia as can be seen post gastric bypass. Evaluation of bowel within the pelvis limited by streak artifact from hip replacements. Scattered colonic diverticula without extraluminal bowel inflammatory process identified. 2 anterior abdominal wall fat and vessel containing hernias. No bowel containing hernia. Post cholecystectomy. Mild prominence of intrahepatic and extrahepatic biliary ducts may represent a normal finding in a patient of this age who is post cholecystectomy (assuming normal liver enzymes). Splenomegaly  with spleen spanning over 13.5 cm similar to prior exam. Aortic Atherosclerosis (ICD10-I70.0). Post hysterectomy. Post bilateral hip replacement. Degenerative change lower thoracic lumbar spine. Electronically Signed   By: Genia Del M.D.   On: 02/05/2017 18:30  [4 week]   Impression: 76 year old female with history of prior gastric bypass in 1980 who presents with recurrent vomiting, upper abdominal discomfort, abnormal LFTs.  CT this admission revealed mild prominence of the intrahepatic and extrahepatic biliary ducts status post cholecystectomy, 2 anterior abdominal wall fat and vessel containing hernias but no bowel containing hernia.  Questionable esophagitis.  LFTs abnormal with minimal elevation of bilirubin, similar to back in May and had improvement in between these episodes.  She has had extensive serologies as well as liver biopsy evaluating her abnormal LFTs.  Question possibility of intermittent biliary obstruction given her symptoms.  Possible esophagitis versus under distention versus under mass on CT.  Last EGD August 2016 with history of esophageal diverticulum as well as Barrett's esophagus.  Plan: 1. To discuss with Dr. Gala Romney.  Patient may benefit from MRCP as next step. 2. Consider updating EGD as an outpatient under the direction of Dr. Laural Golden, her primary gastroenterologist.  3. Repeat labs in am.   We would like to thank you for the opportunity to participate in the care of LURENA NAEVE.  Laureen Ochs. Bernarda Caffey [facility] [phone] 12/5/201810:05 AM     LOS: 0 days     Attending note: Agree with above assessment and recommendations. MRCP reviewed. Suggestion of a 6 mm stone in the bile duct is probably real and not artifact given biliary dilation, elevated LFTs and clinical presentation. Altered upper GI tract anatomy from prior bariatric surgery would make ERCP potentially challenging, if not impossible.  I recommend this nice  lady be transferred to [facility]  for biliary decompression/stone extraction.

## 2017-02-06 NOTE — Care Management Obs Status (Signed)
[place] NOTIFICATION   Patient Details  Name: JAN WALTERS MRN: [id number] Date of Birth: 23-Nov-1940   Medicare Observation Status Notification Given:  Yes    Maryann Mccall, Chauncey Reading, RN 02/06/2017, 11:00 AM

## 2017-02-06 NOTE — Care Management Note (Signed)
Case Management Note  Patient Details  Name: [name] [name] MRN: [id number] Date of Birth: 08-22-1940  Subjective/Objective:  Adm with cholestatsis. From home alone, but lives In a home that is a Science writer, and niece is availabe during the day. Same niece lives 2 blocks away. She has cane and RW pta. She has PCP, still drives some (reports very little) and has insurance with prescription coverage.           Action/Plan: Anticipate DC home with self care.   Expected Discharge Date:    02/08/2017              Expected Discharge Plan:  Home/Self Care  In-House Referral:     Discharge planning Services  CM Consult  Post Acute Care Choice:    Choice offered to:     DME Arranged:    DME Agency:     HH Arranged:    HH Agency:     Status of Service:  In process, will continue to follow  If discussed at Long Length of Stay Meetings, dates discussed:    Additional Comments:  Fronnie Urton, Chauncey Reading, RN 02/06/2017, 11:01 AM

## 2017-02-06 NOTE — Progress Notes (Signed)
PROGRESS NOTE                                                                                                                                                                                                             Patient Demographics:    [name] [name], is a 76 y.o. female, DOB - 04/01/1940, HEN:[id number]  Admit date - 02/05/2017   Admitting Physician Reubin Milan, MD  Outpatient Primary MD for the patient is [name], MD  LOS - 0  Outpatient Specialists:GI  Chief Complaint  Patient presents with  . Nausea       Brief Narrative   76 year old obese female with hypertension, GERD, History of cholecystectomy and gastric bypass,anxiety and depression presented with right upper quadrant and epigastric colicky pain since the morning of admission, after eating her breakfast. She had nausea with multiple episodes of vomiting. In the ED her vitals were stable with labs showing significant transaminitis.CT of the abdomen and pelvis showing thickening of distal esophagus along with mild prominence of intra-and extremity biliary ducts (status post cholecystectomy). Patient admitted to hospitalist service and GI consulted.    Subjective Still having some pain in right upper quadrant. Complains of headache.   Assessment  & Plan :    Principal Problem:   Transaminitis Patient was hospitalized in May for similar presentation and Had multiple workups including serology and liver biopsy. Keep nothing by mouth. IV fluids. Monitor serial LFTs. Check hepatitis panel and CPK.  May benefit from MRCP.Further recommendation per GI.Avoid hepatotoxic agents.  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  Nausea and vomiting Currently resolved. Continue IV hydration. Supportive care with antiemetics. Next line      GERD (gastroesophageal reflux  disease) IV PPI twice a day  Sinus bradycardia On presentation. Currently stable. Likely vasovagal. Monitor on telemetry.  Essential hypertension Continue home blood pressure medications. anxiety and depression is on continue home medications  Hypomagnesemia  Replenished      Code Status : full code  Family Communication  : None at bedside  Disposition Plan  : Home once improved  Barriers For Discharge : Active symptoms  Consults  : GI  Procedures  : CT abdomen and pelvis  DVT Prophylaxis  : Subcutaneous heparin  Lab Results  Component Value Date  PLT 119 (L) 02/06/2017    Antibiotics  :    Anti-infectives (From admission, onward)   None        Objective:   Vitals:   02/05/17 2000 02/05/17 2100 02/05/17 2159 02/06/17 0539  BP: (!) 133/55 (!) 135/44 (!) 140/40 (!) 147/48  Pulse: 67 (!) 50 (!) 50 60  Resp: 14 17 17 18   Temp:   98.2 F (36.8 C) 98.3 F (36.8 C)  TempSrc:   Oral Oral  SpO2: 96% 96% 97% 94%  Weight:   103.5 kg (228 lb 3.2 oz)   Height:   5\' 3"  (1.6 m)     Wt Readings from Last 3 Encounters:  02/05/17 103.5 kg (228 lb 3.2 oz)  08/15/16 103.9 kg (229 lb)  07/12/16 103 kg (227 lb)     Intake/Output Summary (Last 24 hours) at 02/06/2017 1316 Last data filed at 02/06/2017 0800 Gross per 24 hour  Intake 1781.67 ml  Output 501 ml  Net 1280.67 ml     Physical Exam  Gen: not in distress, Fatigued HEENT: no pallor, No icterus,moist mucosa, supple neck Chest: clear b/l, no added sounds CVS: N S1&S2, no murmurs, rubs or gallop GI: soft, Nondistended, bowel sound present, epigastric and right upper quadrant tenderness to pressure Musculoskeletal: warm, no edema     Data Review:    CBC Recent Labs  Lab 02/05/17 1446 02/06/17 0556  WBC 5.4 3.7*  HGB 12.8 11.7*  HCT 40.6 36.9  PLT 112* 119*  MCV 97.4 97.1  MCH 30.7 30.8  MCHC 31.5 31.7  RDW 14.4 14.5  LYMPHSABS 1.0 0.6*  MONOABS 0.1 0.5  EOSABS 0.1 0.1  BASOSABS 0.0  0.0    Chemistries  Recent Labs  Lab 02/05/17 1446 02/05/17 1959 02/06/17 0556  NA 140  --  137  K 3.7  --  3.5  CL 107  --  105  CO2 26  --  26  GLUCOSE 153*  --  103*  BUN 23*  --  15  CREATININE 1.06*  --  0.92  CALCIUM 9.0  --  8.4*  MG  --  1.6*  --   AST 645*  --  482*  ALT 242*  --  357*  ALKPHOS 357*  --  332*  BILITOT 1.6*  --  1.4*   ------------------------------------------------------------------------------------------------------------------ No results for input(s): CHOL, HDL, LDLCALC, TRIG, CHOLHDL, LDLDIRECT in the last 72 hours.  No results found for: HGBA1C ------------------------------------------------------------------------------------------------------------------ No results for input(s): TSH, T4TOTAL, T3FREE, THYROIDAB in the last 72 hours.  Invalid input(s): FREET3 ------------------------------------------------------------------------------------------------------------------ No results for input(s): VITAMINB12, FOLATE, FERRITIN, TIBC, IRON, RETICCTPCT in the last 72 hours.  Coagulation profile Recent Labs  Lab 02/05/17 1648 02/06/17 0556  INR 1.11 1.16    No results for input(s): DDIMER in the last 72 hours.  Cardiac Enzymes No results for input(s): CKMB, TROPONINI, MYOGLOBIN in the last 168 hours.  Invalid input(s): CK ------------------------------------------------------------------------------------------------------------------ No results found for: BNP  Inpatient Medications  Scheduled Meds: . docusate sodium  100 mg Oral QHS  . pantoprazole (PROTONIX) IV  40 mg Intravenous Q12H  . sertraline  100 mg Oral Daily  . traZODone  50 mg Oral QHS  . vitamin B-12  500 mcg Oral Daily   Continuous Infusions: . sodium chloride 100 mL/hr at 02/06/17 1207   PRN Meds:.albuterol, diazepam, ibuprofen, morphine injection, ondansetron (ZOFRAN) IV  Micro Results No results found for this or any previous visit (from the past 240  hour(s)).  Radiology Reports Ct Abdomen Pelvis W Contrast  Result Date: 02/05/2017 CLINICAL DATA:  76 year old female post gastric bypass procedure several years ago. Stomach problems over the past 5 years. Nausea and vomiting since this morning. Prior cholecystectomy appendectomy and hysterectomy. Initial encounter. EXAM: CT ABDOMEN AND PELVIS WITH CONTRAST TECHNIQUE: Multidetector CT imaging of the abdomen and pelvis was performed using the standard protocol following bolus administration of intravenous contrast. CONTRAST:  177mL ISOVUE-300 IOPAMIDOL (ISOVUE-300) INJECTION 61% COMPARISON:  05/29/2015 CT. FINDINGS: Lower chest: Minimal basilar scarring. Left base calcified granuloma. Heart size top-normal. Hepatobiliary: Top-normal size liver without focal worrisome mass. Post cholecystectomy. Mild prominence of intrahepatic and extrahepatic biliary ducts may represent a normal finding in a patient of this age who is post cholecystectomy (assuming normal liver enzymes). Pancreas: Fatty infiltration of the pancreas without pancreatic mass or inflammation. Spleen: Splenomegaly with spleen spanning over 13.5 cm. Tiny calcified granulomas. Prominent splenic vessels. Adrenals/Urinary Tract: No obstructing stone or hydronephrosis. No worrisome renal or adrenal mass. Evaluation urinary bladder limited by streak artifact from hip replacements. Stomach/Bowel: Post gastric bypass procedure. Circumferential thickening distal esophagus. Although under distension may partially explain this, underlying mass or result of esophagitis cannot be excluded. No findings of internal hernia as can be seen post gastric bypass. Evaluation of bowel within the pelvis limited by streak artifact from hip replacements. Scattered colonic diverticula without extraluminal bowel inflammatory process identified. Vascular/Lymphatic: Tortuous lower thoracic aorta with prominent fold. Very mild calcified plaque abdominal aorta without aneurysm or  large vessel occlusion. No adenopathy. Reproductive: Post hysterectomy.  No obvious adnexal mass. Other: 2 anterior abdominal wall fat and vessel containing hernias. No bowel containing hernia. No free air. Musculoskeletal: Post bilateral hip replacements causing significant artifact. Fusion lower thoracic and upper lumbar spine. Kyphosis in this region. Degenerative changes L2-3 through L5-S1. IMPRESSION: Circumferential thickening distal esophagus. Although under distension may partially explain this, underlying mass or result of esophagitis cannot be excluded. No findings of internal hernia as can be seen post gastric bypass. Evaluation of bowel within the pelvis limited by streak artifact from hip replacements. Scattered colonic diverticula without extraluminal bowel inflammatory process identified. 2 anterior abdominal wall fat and vessel containing hernias. No bowel containing hernia. Post cholecystectomy. Mild prominence of intrahepatic and extrahepatic biliary ducts may represent a normal finding in a patient of this age who is post cholecystectomy (assuming normal liver enzymes). Splenomegaly with spleen spanning over 13.5 cm similar to prior exam. Aortic Atherosclerosis (ICD10-I70.0). Post hysterectomy. Post bilateral hip replacement. Degenerative change lower thoracic lumbar spine. Electronically Signed   By: Genia Del M.D.   On: 02/05/2017 18:30    Time Spent in minutes  25   Azarius Lambson M.D on 02/06/2017 at 1:16 PM  Between 7am to 7pm - Pager - [phone]  After 7pm go to [email or web address] - password [facility]  [facility] -  Office  [phone]

## 2017-02-07 DIAGNOSIS — Z96643 Presence of artificial hip joint, bilateral: Secondary | ICD-10-CM | POA: Diagnosis present

## 2017-02-07 DIAGNOSIS — R52 Pain, unspecified: Secondary | ICD-10-CM | POA: Diagnosis not present

## 2017-02-07 DIAGNOSIS — Z9049 Acquired absence of other specified parts of digestive tract: Secondary | ICD-10-CM | POA: Diagnosis not present

## 2017-02-07 DIAGNOSIS — K210 Gastro-esophageal reflux disease with esophagitis: Secondary | ICD-10-CM | POA: Diagnosis present

## 2017-02-07 DIAGNOSIS — R933 Abnormal findings on diagnostic imaging of other parts of digestive tract: Secondary | ICD-10-CM | POA: Diagnosis not present

## 2017-02-07 DIAGNOSIS — Z6841 Body Mass Index (BMI) 40.0 and over, adult: Secondary | ICD-10-CM | POA: Diagnosis not present

## 2017-02-07 DIAGNOSIS — E860 Dehydration: Secondary | ICD-10-CM | POA: Diagnosis present

## 2017-02-07 DIAGNOSIS — R001 Bradycardia, unspecified: Secondary | ICD-10-CM | POA: Diagnosis present

## 2017-02-07 DIAGNOSIS — R1010 Upper abdominal pain, unspecified: Secondary | ICD-10-CM | POA: Diagnosis present

## 2017-02-07 DIAGNOSIS — J302 Other seasonal allergic rhinitis: Secondary | ICD-10-CM | POA: Diagnosis present

## 2017-02-07 DIAGNOSIS — K219 Gastro-esophageal reflux disease without esophagitis: Secondary | ICD-10-CM | POA: Diagnosis not present

## 2017-02-07 DIAGNOSIS — Z96652 Presence of left artificial knee joint: Secondary | ICD-10-CM | POA: Diagnosis present

## 2017-02-07 DIAGNOSIS — K8050 Calculus of bile duct without cholangitis or cholecystitis without obstruction: Secondary | ICD-10-CM | POA: Diagnosis not present

## 2017-02-07 DIAGNOSIS — R945 Abnormal results of liver function studies: Secondary | ICD-10-CM | POA: Diagnosis not present

## 2017-02-07 DIAGNOSIS — Z884 Allergy status to anesthetic agent status: Secondary | ICD-10-CM | POA: Diagnosis not present

## 2017-02-07 DIAGNOSIS — K831 Obstruction of bile duct: Secondary | ICD-10-CM | POA: Diagnosis not present

## 2017-02-07 DIAGNOSIS — Z9884 Bariatric surgery status: Secondary | ICD-10-CM | POA: Diagnosis not present

## 2017-02-07 DIAGNOSIS — K2270 Barrett's esophagus without dysplasia: Secondary | ICD-10-CM | POA: Diagnosis present

## 2017-02-07 DIAGNOSIS — F419 Anxiety disorder, unspecified: Secondary | ICD-10-CM | POA: Diagnosis present

## 2017-02-07 DIAGNOSIS — E876 Hypokalemia: Secondary | ICD-10-CM | POA: Diagnosis not present

## 2017-02-07 DIAGNOSIS — Z882 Allergy status to sulfonamides status: Secondary | ICD-10-CM | POA: Diagnosis not present

## 2017-02-07 DIAGNOSIS — R740 Nonspecific elevation of levels of transaminase and lactic acid dehydrogenase [LDH]: Secondary | ICD-10-CM | POA: Diagnosis not present

## 2017-02-07 DIAGNOSIS — F329 Major depressive disorder, single episode, unspecified: Secondary | ICD-10-CM | POA: Diagnosis present

## 2017-02-07 DIAGNOSIS — D696 Thrombocytopenia, unspecified: Secondary | ICD-10-CM | POA: Diagnosis present

## 2017-02-07 DIAGNOSIS — Z880 Allergy status to penicillin: Secondary | ICD-10-CM | POA: Diagnosis not present

## 2017-02-07 DIAGNOSIS — I10 Essential (primary) hypertension: Secondary | ICD-10-CM | POA: Diagnosis present

## 2017-02-07 DIAGNOSIS — D72819 Decreased white blood cell count, unspecified: Secondary | ICD-10-CM | POA: Diagnosis present

## 2017-02-07 DIAGNOSIS — Q396 Congenital diverticulum of esophagus: Secondary | ICD-10-CM | POA: Diagnosis not present

## 2017-02-07 DIAGNOSIS — K8051 Calculus of bile duct without cholangitis or cholecystitis with obstruction: Secondary | ICD-10-CM | POA: Diagnosis present

## 2017-02-07 DIAGNOSIS — Z91040 Latex allergy status: Secondary | ICD-10-CM | POA: Diagnosis not present

## 2017-02-07 LAB — BASIC METABOLIC PANEL
Anion gap: 9 (ref 5–15)
BUN: 12 mg/dL (ref 6–20)
CO2: 25 mmol/L (ref 22–32)
Calcium: 8.7 mg/dL — ABNORMAL LOW (ref 8.9–10.3)
Chloride: 106 mmol/L (ref 101–111)
Creatinine, Ser: 0.91 mg/dL (ref 0.44–1.00)
GFR calc Af Amer: >60 mL/min (ref >60)
GFR calc non Af Amer: >60 mL/min (ref >60)
Glucose, Bld: 102 mg/dL — ABNORMAL HIGH (ref 65–99)
Potassium: 3.9 mmol/L (ref 3.5–5.1)
Sodium: 140 mmol/L (ref 135–145)

## 2017-02-07 LAB — HEPATIC FUNCTION PANEL
ALT: 242 U/L — ABNORMAL HIGH (ref 14–54)
AST: 173 U/L — ABNORMAL HIGH (ref 15–41)
Albumin: 3.5 g/dL (ref 3.5–5.0)
Alkaline Phosphatase: 350 U/L — ABNORMAL HIGH (ref 38–126)
Bilirubin, Direct: 0.4 mg/dL (ref 0.1–0.5)
Indirect Bilirubin: 0.5 mg/dL (ref 0.3–0.9)
Total Bilirubin: 0.9 mg/dL (ref 0.3–1.2)
Total Protein: 5.9 g/dL — ABNORMAL LOW (ref 6.5–8.1)

## 2017-02-07 LAB — CBC WITH DIFFERENTIAL/PLATELET
Basophils Absolute: 0 10*3/uL (ref 0.0–0.1)
Basophils Relative: 0 %
Eosinophils Absolute: 0.2 10*3/uL (ref 0.0–0.7)
Eosinophils Relative: 4 %
HCT: 39.6 % (ref 36.0–46.0)
Hemoglobin: 12.4 g/dL (ref 12.0–15.0)
Lymphocytes Relative: 17 %
Lymphs Abs: 0.7 10*3/uL (ref 0.7–4.0)
MCH: 30.7 pg (ref 26.0–34.0)
MCHC: 31.3 g/dL (ref 30.0–36.0)
MCV: 98 fL (ref 78.0–100.0)
Monocytes Absolute: 0.6 10*3/uL (ref 0.1–1.0)
Monocytes Relative: 15 %
Neutro Abs: 2.4 10*3/uL (ref 1.7–7.7)
Neutrophils Relative %: 64 %
Platelets: 124 10*3/uL — ABNORMAL LOW (ref 150–400)
RBC: 4.04 MIL/uL (ref 3.87–5.11)
RDW: 14.6 % (ref 11.5–15.5)
WBC: 3.8 10*3/uL — ABNORMAL LOW (ref 4.0–10.5)

## 2017-02-07 LAB — HEPATITIS PANEL, ACUTE
HCV Ab: <0.1 s/co ratio (ref 0.0–0.9)
Hep A IgM: NEGATIVE
Hep B C IgM: NEGATIVE
Hepatitis B Surface Ag: NEGATIVE

## 2017-02-07 MED ORDER — HYDROCHLOROTHIAZIDE 12.5 MG PO CAPS
12.5000 mg | ORAL_CAPSULE | Freq: Every day | ORAL | Status: DC
  Administered 2017-02-07 – 2017-02-09 (×3): 12.5 mg via ORAL
  Filled 2017-02-07 (×3): qty 1

## 2017-02-07 MED ORDER — ADULT MULTIVITAMIN W/MINERALS CH
1.0000 | ORAL_TABLET | Freq: Every day | ORAL | Status: DC
  Administered 2017-02-07 – 2017-02-09 (×3): 1 via ORAL
  Filled 2017-02-07 (×3): qty 1

## 2017-02-07 MED ORDER — LISINOPRIL-HYDROCHLOROTHIAZIDE 20-12.5 MG PO TABS: 1.0000 | ORAL_TABLET | Freq: Every day | ORAL | Status: DC

## 2017-02-07 MED ORDER — PANTOPRAZOLE SODIUM 40 MG PO TBEC
40.0000 mg | DELAYED_RELEASE_TABLET | Freq: Two times a day (BID) | ORAL | Status: DC
  Administered 2017-02-07 – 2017-02-09 (×5): 40 mg via ORAL
  Filled 2017-02-07 (×5): qty 1

## 2017-02-07 MED ORDER — LISINOPRIL 10 MG PO TABS
20.0000 mg | ORAL_TABLET | Freq: Every day | ORAL | Status: DC
  Administered 2017-02-07 – 2017-02-09 (×3): 20 mg via ORAL
  Filled 2017-02-07: qty 4
  Filled 2017-02-07 (×2): qty 2

## 2017-02-07 MED ORDER — HAIR/SKIN/NAILS PO TABS: 1.0000 | ORAL_TABLET | Freq: Every day | ORAL | Status: DC

## 2017-02-07 MED ORDER — CALCIUM CARBONATE-VITAMIN D 500-200 MG-UNIT PO TABS
1.0000 | ORAL_TABLET | Freq: Every day | ORAL | Status: DC
  Administered 2017-02-07 – 2017-02-09 (×3): 1 via ORAL
  Filled 2017-02-07 (×3): qty 1

## 2017-02-07 MED ORDER — FERROUS SULFATE 325 (65 FE) MG PO TABS
325.0000 mg | ORAL_TABLET | Freq: Every day | ORAL | Status: DC
  Administered 2017-02-07 – 2017-02-08 (×2): 325 mg via ORAL
  Filled 2017-02-07 (×2): qty 1

## 2017-02-07 MED ORDER — CIPROFLOXACIN IN D5W 400 MG/200ML IV SOLN
400.0000 mg | Freq: Two times a day (BID) | INTRAVENOUS | Status: DC
  Administered 2017-02-07 – 2017-02-09 (×5): 400 mg via INTRAVENOUS
  Filled 2017-02-07 (×5): qty 200

## 2017-02-07 NOTE — Progress Notes (Signed)
Spoke with Dr. Clementeen Graham.  He has spoken with GI at [facility] who agrees for transfer of patient for ERCP.  Procedure cannot be completed until Monday therefore they are requesting waiting for transfer on Sunday OR earlier if she deteriorates.  Continue empiric antibiotics.   Laureen Ochs. Bernarda Caffey [facility] [phone] 12/6/20189:06 AM

## 2017-02-07 NOTE — Progress Notes (Signed)
PROGRESS NOTE                                                                                                                                                                                                             Patient Demographics:    [name] [name], is a 76 y.o. female, DOB - 09-Feb-1941, EXH:[id number]  Admit date - 02/05/2017   Admitting Physician Reubin Milan, MD  Outpatient Primary MD for the patient is [name], MD  LOS - 0  Outpatient Specialists:GI  Chief Complaint  Patient presents with  . Nausea       Brief Narrative   76 year old obese female with hypertension, GERD, History of cholecystectomy and gastric bypass,anxiety and depression presented with right upper quadrant and epigastric colicky pain since the morning of admission, after eating her breakfast. She had nausea with multiple episodes of vomiting. In the ED her vitals were stable with labs showing significant transaminitis.CT of the abdomen and pelvis showing thickening of distal esophagus along with mild prominence of intra-and extremity biliary ducts (status post cholecystectomy). Patient admitted to hospitalist service and GI consulted. MRCP done showed 6 mm filling defect in the CBD.    Subjective Still having right upper quadrant abdominal pain but not as severe as yesterday. Tolerating Full liquid.   Assessment  & Plan :    Principal Problem:   Transaminitis With Possible acute choledocholithiasis. Patient was hospitalized in May for similar presentation and had multiple workups including serology and liver biopsy  MRCP done showing dilated CBD with 6 mm filling defect suspicious of choledocholithiasis. LFTs slightly improved in Morning labs (AST/AST/AlkP  - 173/242/350).  Patient will need ERCP for stone removal. However with history of gastric bypass this is not possible here. I have spoken with GI (Dr.Rishi Pawa) at [facility] and he has accepted the patient. He recommends that since patient will likely need The procedure in the OR he can only be able to perform the procedure on 12/10 and request patient to be transferred there on 12/9. I have spoken with hospitalist Dr. Amil Amen and patient has been accepted To hospitalist service and will  transferred on 12/9 Or earlier if patient's condition deteriorates. Started on empiric IV ciprofloxacin . Monitor LFTs closely. Pain control with when necessary Motrin and IV morphine.  Active Problems: Esophageal thickening  on CT Last EGD in 2016 showing esophageal diverticulum and Barrett's esophagus.  Nausea and vomiting Resolved      GERD (gastroesophageal reflux disease) On PPI twice a day  Sinus bradycardia On presentation. Currently stable. Discontinue telemetry.  Essential hypertension Continue home blood pressure medications.  anxiety and depression  continue home medications  Hypomagnesemia  Replenished      Code Status : full code  Family Communication  : None at bedside  Disposition Plan  : To [facility] On 12/9 for ERCP  Barriers For Discharge : Active symptoms  Consults  : GI  Procedures  :  CT abdomen and pelvis MRCP  DVT Prophylaxis  : Subcutaneous heparin  Lab Results  Component Value Date   PLT 124 (L) 02/07/2017    Antibiotics  :    Anti-infectives (From admission, onward)   Start     Dose/Rate Route Frequency Ordered Stop   02/07/17 0900  ciprofloxacin (CIPRO) IVPB 400 mg     400 mg 200 mL/hr over 60 Minutes Intravenous Every 12 hours 02/07/17 0836          Objective:   Vitals:   02/06/17 0539 02/06/17 1300 02/06/17 2022 02/07/17 0458  BP: (!) 147/48 (!) 140/53 (!) 143/57 135/67  Pulse: 60 64 (!) 50 61  Resp: 18 18 18 20   Temp: 98.3 F (36.8 C) 98.9 F (37.2 C) 98.5 F (36.9 C) 98.2 F (36.8 C)  TempSrc: Oral Oral Oral Oral  SpO2: 94% 96% 95% 94%  Weight:      Height:         Wt Readings from Last 3 Encounters:  02/05/17 103.5 kg (228 lb 3.2 oz)  08/15/16 103.9 kg (229 lb)  07/12/16 103 kg (227 lb)     Intake/Output Summary (Last 24 hours) at 02/07/2017 1028 Last data filed at 02/07/2017 0941 Gross per 24 hour  Intake 480 ml  Output 2000 ml  Net -1520 ml     Physical Exam Gen.:Elderly obese female not in distress HEENT: No icterus,moist mucosa, supple neck Chest: Clear bilaterally neck signs see this: Normal S1 and S2, no murmurs GI: Soft, nondistended, bowel sounds present, right upper quadrant tenderness (better from yesterday) Musculoskeletal: Warm, no edema      Data Review:    CBC Recent Labs  Lab 02/05/17 1446 02/06/17 0556 02/07/17 0451  WBC 5.4 3.7* 3.8*  HGB 12.8 11.7* 12.4  HCT 40.6 36.9 39.6  PLT 112* 119* 124*  MCV 97.4 97.1 98.0  MCH 30.7 30.8 30.7  MCHC 31.5 31.7 31.3  RDW 14.4 14.5 14.6  LYMPHSABS 1.0 0.6* 0.7  MONOABS 0.1 0.5 0.6  EOSABS 0.1 0.1 0.2  BASOSABS 0.0 0.0 0.0    Chemistries  Recent Labs  Lab 02/05/17 1446 02/05/17 1959 02/06/17 0556 02/07/17 0451  NA 140  --  137 140  K 3.7  --  3.5 3.9  CL 107  --  105 106  CO2 26  --  26 25  GLUCOSE 153*  --  103* 102*  BUN 23*  --  15 12  CREATININE 1.06*  --  0.92 0.91  CALCIUM 9.0  --  8.4* 8.7*  MG  --  1.6*  --   --   AST 645*  --  482* 173*  ALT 242*  --  357* 242*  ALKPHOS 357*  --  332* 350*  BILITOT 1.6*  --  1.4* 0.9   ------------------------------------------------------------------------------------------------------------------ No results for input(s): CHOL, HDL, LDLCALC, TRIG,  CHOLHDL, LDLDIRECT in the last 72 hours.  No results found for: HGBA1C ------------------------------------------------------------------------------------------------------------------ No results for input(s): TSH, T4TOTAL, T3FREE, THYROIDAB in the last 72 hours.  Invalid input(s):  FREET3 ------------------------------------------------------------------------------------------------------------------ No results for input(s): VITAMINB12, FOLATE, FERRITIN, TIBC, IRON, RETICCTPCT in the last 72 hours.  Coagulation profile Recent Labs  Lab 02/05/17 1648 02/06/17 0556  INR 1.11 1.16    No results for input(s): DDIMER in the last 72 hours.  Cardiac Enzymes No results for input(s): CKMB, TROPONINI, MYOGLOBIN in the last 168 hours.  Invalid input(s): CK ------------------------------------------------------------------------------------------------------------------ No results found for: BNP  Inpatient Medications  Scheduled Meds: . calcium-vitamin D  1 tablet Oral Daily  . docusate sodium  100 mg Oral QHS  . ferrous sulfate  325 mg Oral Q breakfast  . heparin injection (subcutaneous)  5,000 Units Subcutaneous Q8H  . lisinopril  20 mg Oral Daily   And  . hydrochlorothiazide  12.5 mg Oral Daily  . multivitamin with minerals  1 tablet Oral Daily  . pantoprazole  40 mg Oral BID  . sertraline  100 mg Oral Daily  . traZODone  50 mg Oral QHS  . vitamin B-12  500 mcg Oral Daily   Continuous Infusions: . sodium chloride 100 mL/hr at 02/07/17 0917  . ciprofloxacin     PRN Meds:.albuterol, diazepam, ibuprofen, morphine injection, ondansetron (ZOFRAN) IV  Micro Results No results found for this or any previous visit (from the past 240 hour(s)).  Radiology Reports Ct Abdomen Pelvis W Contrast  Result Date: 02/05/2017 CLINICAL DATA:  76 year old female post gastric bypass procedure several years ago. Stomach problems over the past 5 years. Nausea and vomiting since this morning. Prior cholecystectomy appendectomy and hysterectomy. Initial encounter. EXAM: CT ABDOMEN AND PELVIS WITH CONTRAST TECHNIQUE: Multidetector CT imaging of the abdomen and pelvis was performed using the standard protocol following bolus administration of intravenous contrast. CONTRAST:  132mL  ISOVUE-300 IOPAMIDOL (ISOVUE-300) INJECTION 61% COMPARISON:  05/29/2015 CT. FINDINGS: Lower chest: Minimal basilar scarring. Left base calcified granuloma. Heart size top-normal. Hepatobiliary: Top-normal size liver without focal worrisome mass. Post cholecystectomy. Mild prominence of intrahepatic and extrahepatic biliary ducts may represent a normal finding in a patient of this age who is post cholecystectomy (assuming normal liver enzymes). Pancreas: Fatty infiltration of the pancreas without pancreatic mass or inflammation. Spleen: Splenomegaly with spleen spanning over 13.5 cm. Tiny calcified granulomas. Prominent splenic vessels. Adrenals/Urinary Tract: No obstructing stone or hydronephrosis. No worrisome renal or adrenal mass. Evaluation urinary bladder limited by streak artifact from hip replacements. Stomach/Bowel: Post gastric bypass procedure. Circumferential thickening distal esophagus. Although under distension may partially explain this, underlying mass or result of esophagitis cannot be excluded. No findings of internal hernia as can be seen post gastric bypass. Evaluation of bowel within the pelvis limited by streak artifact from hip replacements. Scattered colonic diverticula without extraluminal bowel inflammatory process identified. Vascular/Lymphatic: Tortuous lower thoracic aorta with prominent fold. Very mild calcified plaque abdominal aorta without aneurysm or large vessel occlusion. No adenopathy. Reproductive: Post hysterectomy.  No obvious adnexal mass. Other: 2 anterior abdominal wall fat and vessel containing hernias. No bowel containing hernia. No free air. Musculoskeletal: Post bilateral hip replacements causing significant artifact. Fusion lower thoracic and upper lumbar spine. Kyphosis in this region. Degenerative changes L2-3 through L5-S1. IMPRESSION: Circumferential thickening distal esophagus. Although under distension may partially explain this, underlying mass or result of  esophagitis cannot be excluded. No findings of internal hernia as can be seen post gastric bypass. Evaluation of  bowel within the pelvis limited by streak artifact from hip replacements. Scattered colonic diverticula without extraluminal bowel inflammatory process identified. 2 anterior abdominal wall fat and vessel containing hernias. No bowel containing hernia. Post cholecystectomy. Mild prominence of intrahepatic and extrahepatic biliary ducts may represent a normal finding in a patient of this age who is post cholecystectomy (assuming normal liver enzymes). Splenomegaly with spleen spanning over 13.5 cm similar to prior exam. Aortic Atherosclerosis (ICD10-I70.0). Post hysterectomy. Post bilateral hip replacement. Degenerative change lower thoracic lumbar spine. Electronically Signed   By: Genia Del M.D.   On: 02/05/2017 18:30   Mr 3d Recon At Scanner  Result Date: 02/06/2017 CLINICAL DATA:  Nausea and vomiting starting this morning. EXAM: MRI ABDOMEN WITHOUT AND WITH CONTRAST (INCLUDING MRCP) TECHNIQUE: Multiplanar multisequence MR imaging of the abdomen was performed both before and after the administration of intravenous contrast. Heavily T2-weighted images of the biliary and pancreatic ducts were obtained, and three-dimensional MRCP images were rendered by post processing. CONTRAST:  59mL MULTIHANCE GADOBENATE DIMEGLUMINE 529 MG/ML IV SOLN COMPARISON:  02/05/2017 FINDINGS: Despite efforts by the technologist and patient, motion artifact is present on today's exam and could not be eliminated. This reduces exam sensitivity and specificity. Lower chest: Unremarkable Hepatobiliary: I am suspicious for an approximately 6 mm stone/ filling defect in the distal common bile duct as on image 75/10. This is difficult to corroborate and confirm given the severity of motion artifact, and could conceivably be a false-positive. Prior cholecystectomy noted. No obvious abnormal enhancement along the biliary tree or  in the hepatic parenchyma. The common bile duct measures approximately 10 mm in diameter. Pancreas:  Unremarkable Spleen:  Unremarkable Adrenals/Urinary Tract:  Unremarkable Stomach/Bowel: Postoperative findings along the stomach. Otherwise unremarkable Vascular/Lymphatic: Aortoiliac atherosclerotic vascular disease. No pathologic adenopathy. Other:  No supplemental non-categorized findings. Musculoskeletal: Dextroconvex thoracolumbar scoliosis with lumbar spondylosis and degenerative disc disease causing considerable multilevel impingement. IMPRESSION: 1. Mildly dilated common bile duct with suspected choledocholithiasis, with a 6 mm apparent filling defect on the MRCP images. These images are severely degraded by motion artifact, as are other sequences, resulting in reduced diagnostic accuracy. Some of the biliary dilatation might be attributable to physiologic effects of prior cholecystectomy. 2.  Aortoiliac atherosclerotic vascular disease. 3. Lumbar spondylosis and degenerative disc disease causing multilevel impingement. 4. Postoperative findings along the stomach. Electronically Signed   By: Van Clines M.D.   On: 02/06/2017 14:01   Mr Abdomen Mrcp Moise Boring Contast  Result Date: 02/06/2017 CLINICAL DATA:  Nausea and vomiting starting this morning. EXAM: MRI ABDOMEN WITHOUT AND WITH CONTRAST (INCLUDING MRCP) TECHNIQUE: Multiplanar multisequence MR imaging of the abdomen was performed both before and after the administration of intravenous contrast. Heavily T2-weighted images of the biliary and pancreatic ducts were obtained, and three-dimensional MRCP images were rendered by post processing. CONTRAST:  68mL MULTIHANCE GADOBENATE DIMEGLUMINE 529 MG/ML IV SOLN COMPARISON:  02/05/2017 FINDINGS: Despite efforts by the technologist and patient, motion artifact is present on today's exam and could not be eliminated. This reduces exam sensitivity and specificity. Lower chest: Unremarkable Hepatobiliary: I am  suspicious for an approximately 6 mm stone/ filling defect in the distal common bile duct as on image 75/10. This is difficult to corroborate and confirm given the severity of motion artifact, and could conceivably be a false-positive. Prior cholecystectomy noted. No obvious abnormal enhancement along the biliary tree or in the hepatic parenchyma. The common bile duct measures approximately 10 mm in diameter. Pancreas:  Unremarkable Spleen:  Unremarkable  Adrenals/Urinary Tract:  Unremarkable Stomach/Bowel: Postoperative findings along the stomach. Otherwise unremarkable Vascular/Lymphatic: Aortoiliac atherosclerotic vascular disease. No pathologic adenopathy. Other:  No supplemental non-categorized findings. Musculoskeletal: Dextroconvex thoracolumbar scoliosis with lumbar spondylosis and degenerative disc disease causing considerable multilevel impingement. IMPRESSION: 1. Mildly dilated common bile duct with suspected choledocholithiasis, with a 6 mm apparent filling defect on the MRCP images. These images are severely degraded by motion artifact, as are other sequences, resulting in reduced diagnostic accuracy. Some of the biliary dilatation might be attributable to physiologic effects of prior cholecystectomy. 2.  Aortoiliac atherosclerotic vascular disease. 3. Lumbar spondylosis and degenerative disc disease causing multilevel impingement. 4. Postoperative findings along the stomach. Electronically Signed   By: Van Clines M.D.   On: 02/06/2017 14:01    Time Spent in minutes  25   Lakela Kuba M.D on 02/07/2017 at 10:28 AM  Between 7am to 7pm - Pager - [phone]  After 7pm go to [email or web address] - password [facility]  [facility] -  Office  [phone]

## 2017-02-08 DIAGNOSIS — K219 Gastro-esophageal reflux disease without esophagitis: Secondary | ICD-10-CM

## 2017-02-08 DIAGNOSIS — K8050 Calculus of bile duct without cholangitis or cholecystitis without obstruction: Secondary | ICD-10-CM | POA: Diagnosis present

## 2017-02-08 DIAGNOSIS — R740 Nonspecific elevation of levels of transaminase and lactic acid dehydrogenase [LDH]: Secondary | ICD-10-CM

## 2017-02-08 DIAGNOSIS — E876 Hypokalemia: Secondary | ICD-10-CM

## 2017-02-08 DIAGNOSIS — K831 Obstruction of bile duct: Secondary | ICD-10-CM

## 2017-02-08 DIAGNOSIS — R52 Pain, unspecified: Secondary | ICD-10-CM

## 2017-02-08 LAB — BASIC METABOLIC PANEL
Anion gap: 6 (ref 5–15)
BUN: 10 mg/dL (ref 6–20)
CO2: 27 mmol/L (ref 22–32)
Calcium: 8.4 mg/dL — ABNORMAL LOW (ref 8.9–10.3)
Chloride: 104 mmol/L (ref 101–111)
Creatinine, Ser: 0.93 mg/dL (ref 0.44–1.00)
GFR calc Af Amer: >60 mL/min (ref >60)
GFR calc non Af Amer: 59 mL/min — ABNORMAL LOW (ref >60)
Glucose, Bld: 95 mg/dL (ref 65–99)
Potassium: 3.3 mmol/L — ABNORMAL LOW (ref 3.5–5.1)
Sodium: 137 mmol/L (ref 135–145)

## 2017-02-08 LAB — CBC WITH DIFFERENTIAL/PLATELET
Basophils Absolute: 0 10*3/uL (ref 0.0–0.1)
Basophils Relative: 0 %
Eosinophils Absolute: 0.2 10*3/uL (ref 0.0–0.7)
Eosinophils Relative: 4 %
HCT: 36.5 % (ref 36.0–46.0)
Hemoglobin: 11.6 g/dL — ABNORMAL LOW (ref 12.0–15.0)
Lymphocytes Relative: 24 %
Lymphs Abs: 0.9 10*3/uL (ref 0.7–4.0)
MCH: 31 pg (ref 26.0–34.0)
MCHC: 31.8 g/dL (ref 30.0–36.0)
MCV: 97.6 fL (ref 78.0–100.0)
Monocytes Absolute: 0.5 10*3/uL (ref 0.1–1.0)
Monocytes Relative: 14 %
Neutro Abs: 2.1 10*3/uL (ref 1.7–7.7)
Neutrophils Relative %: 58 %
Platelets: 111 10*3/uL — ABNORMAL LOW (ref 150–400)
RBC: 3.74 MIL/uL — ABNORMAL LOW (ref 3.87–5.11)
RDW: 14.6 % (ref 11.5–15.5)
WBC: 3.6 10*3/uL — ABNORMAL LOW (ref 4.0–10.5)

## 2017-02-08 LAB — HEPATIC FUNCTION PANEL
ALT: 146 U/L — ABNORMAL HIGH (ref 14–54)
AST: 59 U/L — ABNORMAL HIGH (ref 15–41)
Albumin: 3.3 g/dL — ABNORMAL LOW (ref 3.5–5.0)
Alkaline Phosphatase: 273 U/L — ABNORMAL HIGH (ref 38–126)
Bilirubin, Direct: 0.2 mg/dL (ref 0.1–0.5)
Indirect Bilirubin: 0.4 mg/dL (ref 0.3–0.9)
Total Bilirubin: 0.6 mg/dL (ref 0.3–1.2)
Total Protein: 5.4 g/dL — ABNORMAL LOW (ref 6.5–8.1)

## 2017-02-08 MED ORDER — POTASSIUM CHLORIDE CRYS ER 20 MEQ PO TBCR
40.0000 meq | EXTENDED_RELEASE_TABLET | Freq: Once | ORAL | Status: AC
  Administered 2017-02-08: 40 meq via ORAL
  Filled 2017-02-08: qty 2

## 2017-02-08 NOTE — Progress Notes (Signed)
Subjective: Overall she feels better today.  She did have a bowel movement this morning which was diarrhea but no blood noted.  No nausea or vomiting.  Denies fever and chills.  Persistent mild soreness.  No other GI concerns.  Objective: Vital signs in last 24 hours: Temp:  [98.3 F (36.8 C)-99.3 F (37.4 C)] 98.8 F (37.1 C) (12/07 0407) Pulse Rate:  [51-81] 81 (12/07 0407) Resp:  [18-20] 20 (12/07 0407) BP: (137-159)/(51-65) 139/65 (12/07 0407) SpO2:  [96 %-100 %] 100 % (12/07 0407) Last BM Date: 02/04/17 General:   Alert and oriented, pleasant Head:  Normocephalic and atraumatic. Eyes:  No icterus, sclera clear. Conjuctiva pink.  Heart:  S1, S2 present, no murmurs noted.  Lungs: Clear to auscultation bilaterally, without wheezing, rales, or rhonchi.  Abdomen:  Bowel sounds present, soft, non-distended. No HSM or hernias noted. No rebound or guarding. No masses appreciated  Msk:  Symmetrical without gross deformities. Pulses:  Normal bilateral DP pulses noted. Extremities:  Without clubbing or edema. Neurologic:  Alert and  oriented x4;  grossly normal neurologically. Psych:  Alert and cooperative. Normal mood and affect.  Intake/Output from previous day: 12/06 0701 - 12/07 0700 In: 6153.3 [P.O.:960; I.V.:4793.3; IV Piggyback:400] Out: 4300 [Urine:4300] Intake/Output this shift: No intake/output data recorded.  Lab Results: Recent Labs    02/06/17 0556 02/07/17 0451 02/08/17 0415  WBC 3.7* 3.8* 3.6*  HGB 11.7* 12.4 11.6*  HCT 36.9 39.6 36.5  PLT 119* 124* 111*   BMET Recent Labs    02/06/17 0556 02/07/17 0451 02/08/17 0415  NA 137 140 137  K 3.5 3.9 3.3*  CL 105 106 104  CO2 26 25 27   GLUCOSE 103* 102* 95  BUN 15 12 10   CREATININE 0.92 0.91 0.93  CALCIUM 8.4* 8.7* 8.4*   LFT Recent Labs    02/06/17 0556 02/07/17 0451 02/08/17 0415  PROT 5.6* 5.9* 5.4*  ALBUMIN 3.4* 3.5 3.3*  AST 482* 173* 59*  ALT 357* 242* 146*  ALKPHOS 332* 350* 273*   BILITOT 1.4* 0.9 0.6  BILIDIR 0.9* 0.4 0.2  IBILI 0.5 0.5 0.4   PT/INR Recent Labs    02/05/17 1648 02/06/17 0556  LABPROT 14.2 14.7  INR 1.11 1.16   Hepatitis Panel Recent Labs    02/06/17 0556  HEPBSAG Negative  HCVAB <0.1  HEPAIGM Negative  HEPBIGM Negative     Studies/Results: Mr 3d Recon At Scanner  Result Date: 02/06/2017 CLINICAL DATA:  Nausea and vomiting starting this morning. EXAM: MRI ABDOMEN WITHOUT AND WITH CONTRAST (INCLUDING MRCP) TECHNIQUE: Multiplanar multisequence MR imaging of the abdomen was performed both before and after the administration of intravenous contrast. Heavily T2-weighted images of the biliary and pancreatic ducts were obtained, and three-dimensional MRCP images were rendered by post processing. CONTRAST:  71mL MULTIHANCE GADOBENATE DIMEGLUMINE 529 MG/ML IV SOLN COMPARISON:  02/05/2017 FINDINGS: Despite efforts by the technologist and patient, motion artifact is present on today's exam and could not be eliminated. This reduces exam sensitivity and specificity. Lower chest: Unremarkable Hepatobiliary: I am suspicious for an approximately 6 mm stone/ filling defect in the distal common bile duct as on image 75/10. This is difficult to corroborate and confirm given the severity of motion artifact, and could conceivably be a false-positive. Prior cholecystectomy noted. No obvious abnormal enhancement along the biliary tree or in the hepatic parenchyma. The common bile duct measures approximately 10 mm in diameter. Pancreas:  Unremarkable Spleen:  Unremarkable Adrenals/Urinary Tract:  Unremarkable Stomach/Bowel:  Postoperative findings along the stomach. Otherwise unremarkable Vascular/Lymphatic: Aortoiliac atherosclerotic vascular disease. No pathologic adenopathy. Other:  No supplemental non-categorized findings. Musculoskeletal: Dextroconvex thoracolumbar scoliosis with lumbar spondylosis and degenerative disc disease causing considerable multilevel  impingement. IMPRESSION: 1. Mildly dilated common bile duct with suspected choledocholithiasis, with a 6 mm apparent filling defect on the MRCP images. These images are severely degraded by motion artifact, as are other sequences, resulting in reduced diagnostic accuracy. Some of the biliary dilatation might be attributable to physiologic effects of prior cholecystectomy. 2.  Aortoiliac atherosclerotic vascular disease. 3. Lumbar spondylosis and degenerative disc disease causing multilevel impingement. 4. Postoperative findings along the stomach. Electronically Signed   By: Van Clines M.D.   On: 02/06/2017 14:01   Mr Abdomen Mrcp Moise Boring Contast  Result Date: 02/06/2017 CLINICAL DATA:  Nausea and vomiting starting this morning. EXAM: MRI ABDOMEN WITHOUT AND WITH CONTRAST (INCLUDING MRCP) TECHNIQUE: Multiplanar multisequence MR imaging of the abdomen was performed both before and after the administration of intravenous contrast. Heavily T2-weighted images of the biliary and pancreatic ducts were obtained, and three-dimensional MRCP images were rendered by post processing. CONTRAST:  87mL MULTIHANCE GADOBENATE DIMEGLUMINE 529 MG/ML IV SOLN COMPARISON:  02/05/2017 FINDINGS: Despite efforts by the technologist and patient, motion artifact is present on today's exam and could not be eliminated. This reduces exam sensitivity and specificity. Lower chest: Unremarkable Hepatobiliary: I am suspicious for an approximately 6 mm stone/ filling defect in the distal common bile duct as on image 75/10. This is difficult to corroborate and confirm given the severity of motion artifact, and could conceivably be a false-positive. Prior cholecystectomy noted. No obvious abnormal enhancement along the biliary tree or in the hepatic parenchyma. The common bile duct measures approximately 10 mm in diameter. Pancreas:  Unremarkable Spleen:  Unremarkable Adrenals/Urinary Tract:  Unremarkable Stomach/Bowel: Postoperative findings  along the stomach. Otherwise unremarkable Vascular/Lymphatic: Aortoiliac atherosclerotic vascular disease. No pathologic adenopathy. Other:  No supplemental non-categorized findings. Musculoskeletal: Dextroconvex thoracolumbar scoliosis with lumbar spondylosis and degenerative disc disease causing considerable multilevel impingement. IMPRESSION: 1. Mildly dilated common bile duct with suspected choledocholithiasis, with a 6 mm apparent filling defect on the MRCP images. These images are severely degraded by motion artifact, as are other sequences, resulting in reduced diagnostic accuracy. Some of the biliary dilatation might be attributable to physiologic effects of prior cholecystectomy. 2.  Aortoiliac atherosclerotic vascular disease. 3. Lumbar spondylosis and degenerative disc disease causing multilevel impingement. 4. Postoperative findings along the stomach. Electronically Signed   By: Van Clines M.D.   On: 02/06/2017 14:01    Assessment: 76 year old female with history of prior gastric bypass in 1980 who presents with recurrent vomiting, upper abdominal discomfort, abnormal LFTs.  CT this admission revealed mild prominence of the intrahepatic and extrahepatic biliary ducts status post cholecystectomy, 2 anterior abdominal wall fat and vessel containing hernias but no bowel containing hernia.  Questionable esophagitis.  LFTs abnormal with minimal elevation of bilirubin, similar to back in May and had improvement in between these episodes.  She has had extensive serologies as well as liver biopsy evaluating her abnormal LFTs.  Question possibility of intermittent biliary obstruction given her symptoms.  MRCP completed which showed only dilated common bile duct with suspected choledocholithiasis with a 6 mm apparent filling defect.  Given her history of gastric bypass she is planned to undergo ERCP at [facility] on Monday.  They have requested we transfer on  Sunday.  Hospitalist is trying to get her  transferred tomorrow before the winter weather this weekend.  Overall today she is improved, feels much better than when she was admitted.  Some mild persistent soreness.  No rectal bleeding or melena, no nausea and vomiting.  She is having diarrhea with her bowel movements.  No fever or chills.  She is on empiric antibiotics.  Low white blood cell count at 3.6, stable hemoglobin is 11.6.  Liver enzymes are improved with AST/ALT at 59/146, alkaline phosphatase also improved at 273.  Bilirubin normal at 0.6.  Plan: 1. Continue current treatments. 2. Continue antibiotics. 3. Supportive measures. 4. Plan for transfer to [facility] sometime this weekend for ERCP on Monday.   Thank you for allowing [place] to participate in the care of Amado Nash, DNP, AGNP-C Adult & Gerontological Nurse Practitioner [facility]    LOS: 1 day    02/08/2017, 8:13 AM

## 2017-02-08 NOTE — Progress Notes (Signed)
PROGRESS NOTE                                                                                                                                                                                                             Patient Demographics:    [name] [name], is a 76 y.o. female, DOB - [birth year], KVQ:[id number]  Admit date - 02/05/2017   Admitting Physician Reubin Milan, MD  Outpatient Primary MD for the patient is [name], MD  LOS - 1  Outpatient Specialists:GI  Chief Complaint  Patient presents with  . Nausea       Brief Narrative   76 year old obese female with hypertension, GERD, History of cholecystectomy and gastric bypass,anxiety and depression presented with right upper quadrant and epigastric colicky pain since the morning of admission, after eating her breakfast. She had nausea with multiple episodes of vomiting. In the ED her vitals were stable with labs showing significant transaminitis.CT of the abdomen and pelvis showing thickening of distal esophagus along with mild prominence of intra-and extremity biliary ducts (status post cholecystectomy). Patient admitted to hospitalist service and GI consulted. MRCP done showed 6 mm filling defect in the CBD.    Subjective Abdominal pain improved today. No nausea or vomiting. Tolerating full liquid.   Assessment  & Plan :    Principal Problem:   Transaminitis With Possible acute choledocholithiasis. Patient was hospitalized in May for similar presentation and had multiple workups including serology and liver biopsy  MRCP done showing dilated CBD with 6 mm filling defect suspicious of choledocholithiasis. LFTs continue to improve in morning labs.  Patient will need ERCP for stone removal. However with history of gastric bypass this is not possible here. Spoke with GI (Dr.Rishi Pawa) at [facility] on 12/6 and he has accepted the  patient. He recommends that since patient will likely need The procedure in the OR he can only be able to perform the procedure on 12/10 and request patient to be transferred there on 12/9. However due to weather condition I have called back [facility] and I asked if we could transfer her there on 12/8. Dr. Emmit Alexanders (hospitalist) would be on call tomorrow and I will call them again for possible transfer. Continue empiric ciprofloxacin. Monitor LFTs. Pain control with when necessary Motrin and low-dose IV morphine.  Active Problems: Esophageal thickening  on CT Last EGD in 2016 showing esophageal diverticulum and Barrett's esophagus.  Nausea and vomiting Resolved      GERD (gastroesophageal reflux disease) Continue PPI  Sinus bradycardia On presentation. Resolved.  Essential hypertension Continue home blood pressure medications.  anxiety and depression  continue home medications  Hypokalemia/Hypomagnesemia  Replenished  Thrombocytopenia Mild. Monitor for now.    Code Status : full code  Family Communication  : None at bedside  Disposition Plan  : To [facility]  for ERCP  Barriers For Discharge : Awaiting transfer to [place]  : GI  Procedures  :  CT abdomen and pelvis MRCP  DVT Prophylaxis  : Subcutaneous heparin  Lab Results  Component Value Date   PLT 111 (L) 02/08/2017    Antibiotics  :    Anti-infectives (From admission, onward)   Start     Dose/Rate Route Frequency Ordered Stop   02/07/17 0900  ciprofloxacin (CIPRO) IVPB 400 mg     400 mg 200 mL/hr over 60 Minutes Intravenous Every 12 hours 02/07/17 0836          Objective:   Vitals:   02/07/17 0458 02/07/17 1521 02/07/17 2132 02/08/17 0407  BP: 135/67 (!) 159/53 (!) 137/51 139/65  Pulse: 61 70 (!) 51 81  Resp: 20 20 18 20   Temp: 98.2 F (36.8 C) 99.3 F (37.4 C) 98.3 F (36.8 C) 98.8 F (37.1 C)  TempSrc: Oral  Oral   SpO2: 94% 98% 96% 100%  Weight:      Height:         Wt Readings from Last 3 Encounters:  02/05/17 103.5 kg (228 lb 3.2 oz)  08/15/16 103.9 kg (229 lb)  07/12/16 103 kg (227 lb)     Intake/Output Summary (Last 24 hours) at 02/08/2017 1149 Last data filed at 02/08/2017 0920 Gross per 24 hour  Intake 6113.33 ml  Output 3500 ml  Net 2613.33 ml     Physical Exam Gen.: Elderly obese female not in distress HEENT: Moist mucosa, supple neck Chest: Clear bilaterally CVS: Normal S1 and S2, no murmurs GI: Soft, nondistended, bowel sounds present, minimal right upper quadrant tenderness Musculaoskeletal: Warm, no edema       Data Review:    CBC Recent Labs  Lab 02/05/17 1446 02/06/17 0556 02/07/17 0451 02/08/17 0415  WBC 5.4 3.7* 3.8* 3.6*  HGB 12.8 11.7* 12.4 11.6*  HCT 40.6 36.9 39.6 36.5  PLT 112* 119* 124* 111*  MCV 97.4 97.1 98.0 97.6  MCH 30.7 30.8 30.7 31.0  MCHC 31.5 31.7 31.3 31.8  RDW 14.4 14.5 14.6 14.6  LYMPHSABS 1.0 0.6* 0.7 0.9  MONOABS 0.1 0.5 0.6 0.5  EOSABS 0.1 0.1 0.2 0.2  BASOSABS 0.0 0.0 0.0 0.0    Chemistries  Recent Labs  Lab 02/05/17 1446 02/05/17 1959 02/06/17 0556 02/07/17 0451 02/08/17 0415  NA 140  --  137 140 137  K 3.7  --  3.5 3.9 3.3*  CL 107  --  105 106 104  CO2 26  --  26 25 27   GLUCOSE 153*  --  103* 102* 95  BUN 23*  --  15 12 10   CREATININE 1.06*  --  0.92 0.91 0.93  CALCIUM 9.0  --  8.4* 8.7* 8.4*  MG  --  1.6*  --   --   --   AST 645*  --  482* 173* 59*  ALT 242*  --  357* 242* 146*  ALKPHOS 357*  --  332* 350* 273*  BILITOT 1.6*  --  1.4* 0.9 0.6   ------------------------------------------------------------------------------------------------------------------ No results for input(s): CHOL, HDL, LDLCALC, TRIG, CHOLHDL, LDLDIRECT in the last 72 hours.  No results found for: HGBA1C ------------------------------------------------------------------------------------------------------------------ No results for input(s): TSH, T4TOTAL, T3FREE, THYROIDAB in the  last 72 hours.  Invalid input(s): FREET3 ------------------------------------------------------------------------------------------------------------------ No results for input(s): VITAMINB12, FOLATE, FERRITIN, TIBC, IRON, RETICCTPCT in the last 72 hours.  Coagulation profile Recent Labs  Lab 02/05/17 1648 02/06/17 0556  INR 1.11 1.16    No results for input(s): DDIMER in the last 72 hours.  Cardiac Enzymes No results for input(s): CKMB, TROPONINI, MYOGLOBIN in the last 168 hours.  Invalid input(s): CK ------------------------------------------------------------------------------------------------------------------ No results found for: BNP  Inpatient Medications  Scheduled Meds: . calcium-vitamin D  1 tablet Oral Daily  . docusate sodium  100 mg Oral QHS  . ferrous sulfate  325 mg Oral Q breakfast  . heparin injection (subcutaneous)  5,000 Units Subcutaneous Q8H  . lisinopril  20 mg Oral Daily   And  . hydrochlorothiazide  12.5 mg Oral Daily  . multivitamin with minerals  1 tablet Oral Daily  . pantoprazole  40 mg Oral BID  . sertraline  100 mg Oral Daily  . traZODone  50 mg Oral QHS  . vitamin B-12  500 mcg Oral Daily   Continuous Infusions: . sodium chloride 100 mL/hr at 02/08/17 0834  . ciprofloxacin Stopped (02/08/17 0920)   PRN Meds:.albuterol, diazepam, ibuprofen, morphine injection, ondansetron (ZOFRAN) IV  Micro Results No results found for this or any previous visit (from the past 240 hour(s)).  Radiology Reports Ct Abdomen Pelvis W Contrast  Result Date: 02/05/2017 CLINICAL DATA:  76 year old female post gastric bypass procedure several years ago. Stomach problems over the past 5 years. Nausea and vomiting since this morning. Prior cholecystectomy appendectomy and hysterectomy. Initial encounter. EXAM: CT ABDOMEN AND PELVIS WITH CONTRAST TECHNIQUE: Multidetector CT imaging of the abdomen and pelvis was performed using the standard protocol following bolus  administration of intravenous contrast. CONTRAST:  116mL ISOVUE-300 IOPAMIDOL (ISOVUE-300) INJECTION 61% COMPARISON:  05/29/2015 CT. FINDINGS: Lower chest: Minimal basilar scarring. Left base calcified granuloma. Heart size top-normal. Hepatobiliary: Top-normal size liver without focal worrisome mass. Post cholecystectomy. Mild prominence of intrahepatic and extrahepatic biliary ducts may represent a normal finding in a patient of this age who is post cholecystectomy (assuming normal liver enzymes). Pancreas: Fatty infiltration of the pancreas without pancreatic mass or inflammation. Spleen: Splenomegaly with spleen spanning over 13.5 cm. Tiny calcified granulomas. Prominent splenic vessels. Adrenals/Urinary Tract: No obstructing stone or hydronephrosis. No worrisome renal or adrenal mass. Evaluation urinary bladder limited by streak artifact from hip replacements. Stomach/Bowel: Post gastric bypass procedure. Circumferential thickening distal esophagus. Although under distension may partially explain this, underlying mass or result of esophagitis cannot be excluded. No findings of internal hernia as can be seen post gastric bypass. Evaluation of bowel within the pelvis limited by streak artifact from hip replacements. Scattered colonic diverticula without extraluminal bowel inflammatory process identified. Vascular/Lymphatic: Tortuous lower thoracic aorta with prominent fold. Very mild calcified plaque abdominal aorta without aneurysm or large vessel occlusion. No adenopathy. Reproductive: Post hysterectomy.  No obvious adnexal mass. Other: 2 anterior abdominal wall fat and vessel containing hernias. No bowel containing hernia. No free air. Musculoskeletal: Post bilateral hip replacements causing significant artifact. Fusion lower thoracic and upper lumbar spine. Kyphosis in this region. Degenerative changes L2-3 through L5-S1. IMPRESSION: Circumferential thickening distal esophagus. Although under distension may  partially explain  this, underlying mass or result of esophagitis cannot be excluded. No findings of internal hernia as can be seen post gastric bypass. Evaluation of bowel within the pelvis limited by streak artifact from hip replacements. Scattered colonic diverticula without extraluminal bowel inflammatory process identified. 2 anterior abdominal wall fat and vessel containing hernias. No bowel containing hernia. Post cholecystectomy. Mild prominence of intrahepatic and extrahepatic biliary ducts may represent a normal finding in a patient of this age who is post cholecystectomy (assuming normal liver enzymes). Splenomegaly with spleen spanning over 13.5 cm similar to prior exam. Aortic Atherosclerosis (ICD10-I70.0). Post hysterectomy. Post bilateral hip replacement. Degenerative change lower thoracic lumbar spine. Electronically Signed   By: Genia Del M.D.   On: 02/05/2017 18:30   Mr 3d Recon At Scanner  Result Date: 02/06/2017 CLINICAL DATA:  Nausea and vomiting starting this morning. EXAM: MRI ABDOMEN WITHOUT AND WITH CONTRAST (INCLUDING MRCP) TECHNIQUE: Multiplanar multisequence MR imaging of the abdomen was performed both before and after the administration of intravenous contrast. Heavily T2-weighted images of the biliary and pancreatic ducts were obtained, and three-dimensional MRCP images were rendered by post processing. CONTRAST:  51mL MULTIHANCE GADOBENATE DIMEGLUMINE 529 MG/ML IV SOLN COMPARISON:  02/05/2017 FINDINGS: Despite efforts by the technologist and patient, motion artifact is present on today's exam and could not be eliminated. This reduces exam sensitivity and specificity. Lower chest: Unremarkable Hepatobiliary: I am suspicious for an approximately 6 mm stone/ filling defect in the distal common bile duct as on image 75/10. This is difficult to corroborate and confirm given the severity of motion artifact, and could conceivably be a false-positive. Prior cholecystectomy noted. No  obvious abnormal enhancement along the biliary tree or in the hepatic parenchyma. The common bile duct measures approximately 10 mm in diameter. Pancreas:  Unremarkable Spleen:  Unremarkable Adrenals/Urinary Tract:  Unremarkable Stomach/Bowel: Postoperative findings along the stomach. Otherwise unremarkable Vascular/Lymphatic: Aortoiliac atherosclerotic vascular disease. No pathologic adenopathy. Other:  No supplemental non-categorized findings. Musculoskeletal: Dextroconvex thoracolumbar scoliosis with lumbar spondylosis and degenerative disc disease causing considerable multilevel impingement. IMPRESSION: 1. Mildly dilated common bile duct with suspected choledocholithiasis, with a 6 mm apparent filling defect on the MRCP images. These images are severely degraded by motion artifact, as are other sequences, resulting in reduced diagnostic accuracy. Some of the biliary dilatation might be attributable to physiologic effects of prior cholecystectomy. 2.  Aortoiliac atherosclerotic vascular disease. 3. Lumbar spondylosis and degenerative disc disease causing multilevel impingement. 4. Postoperative findings along the stomach. Electronically Signed   By: Van Clines M.D.   On: 02/06/2017 14:01   Mr Abdomen Mrcp Moise Boring Contast  Result Date: 02/06/2017 CLINICAL DATA:  Nausea and vomiting starting this morning. EXAM: MRI ABDOMEN WITHOUT AND WITH CONTRAST (INCLUDING MRCP) TECHNIQUE: Multiplanar multisequence MR imaging of the abdomen was performed both before and after the administration of intravenous contrast. Heavily T2-weighted images of the biliary and pancreatic ducts were obtained, and three-dimensional MRCP images were rendered by post processing. CONTRAST:  69mL MULTIHANCE GADOBENATE DIMEGLUMINE 529 MG/ML IV SOLN COMPARISON:  02/05/2017 FINDINGS: Despite efforts by the technologist and patient, motion artifact is present on today's exam and could not be eliminated. This reduces exam sensitivity and  specificity. Lower chest: Unremarkable Hepatobiliary: I am suspicious for an approximately 6 mm stone/ filling defect in the distal common bile duct as on image 75/10. This is difficult to corroborate and confirm given the severity of motion artifact, and could conceivably be a false-positive. Prior cholecystectomy noted. No obvious abnormal enhancement along  the biliary tree or in the hepatic parenchyma. The common bile duct measures approximately 10 mm in diameter. Pancreas:  Unremarkable Spleen:  Unremarkable Adrenals/Urinary Tract:  Unremarkable Stomach/Bowel: Postoperative findings along the stomach. Otherwise unremarkable Vascular/Lymphatic: Aortoiliac atherosclerotic vascular disease. No pathologic adenopathy. Other:  No supplemental non-categorized findings. Musculoskeletal: Dextroconvex thoracolumbar scoliosis with lumbar spondylosis and degenerative disc disease causing considerable multilevel impingement. IMPRESSION: 1. Mildly dilated common bile duct with suspected choledocholithiasis, with a 6 mm apparent filling defect on the MRCP images. These images are severely degraded by motion artifact, as are other sequences, resulting in reduced diagnostic accuracy. Some of the biliary dilatation might be attributable to physiologic effects of prior cholecystectomy. 2.  Aortoiliac atherosclerotic vascular disease. 3. Lumbar spondylosis and degenerative disc disease causing multilevel impingement. 4. Postoperative findings along the stomach. Electronically Signed   By: Van Clines M.D.   On: 02/06/2017 14:01    Time Spent in minutes  25   Kayly Kriegel M.D on 02/08/2017 at 11:49 AM  Between 7am to 7pm - Pager - [phone]  After 7pm go to [email or web address] - password [facility]  [facility] -  Office  [phone]

## 2017-02-08 NOTE — Care Management Important Message (Signed)
Important Message  Patient Details  Name: NATURI ALARID MRN: [id number] Date of Birth: 08/23/1940   Medicare Important Message Given:  Yes    Sherald Barge, RN 02/08/2017, 10:07 AM

## 2017-02-09 DIAGNOSIS — R001 Bradycardia, unspecified: Secondary | ICD-10-CM

## 2017-02-09 LAB — COMPREHENSIVE METABOLIC PANEL
ALT: 110 U/L — ABNORMAL HIGH (ref 14–54)
AST: 33 U/L (ref 15–41)
Albumin: 3.7 g/dL (ref 3.5–5.0)
Alkaline Phosphatase: 276 U/L — ABNORMAL HIGH (ref 38–126)
Anion gap: 7 (ref 5–15)
BUN: 6 mg/dL (ref 6–20)
CO2: 27 mmol/L (ref 22–32)
Calcium: 8.5 mg/dL — ABNORMAL LOW (ref 8.9–10.3)
Chloride: 106 mmol/L (ref 101–111)
Creatinine, Ser: 1.04 mg/dL — ABNORMAL HIGH (ref 0.44–1.00)
GFR calc Af Amer: 59 mL/min — ABNORMAL LOW (ref >60)
GFR calc non Af Amer: 51 mL/min — ABNORMAL LOW (ref >60)
Glucose, Bld: 97 mg/dL (ref 65–99)
Potassium: 3.6 mmol/L (ref 3.5–5.1)
Sodium: 140 mmol/L (ref 135–145)
Total Bilirubin: 0.7 mg/dL (ref 0.3–1.2)
Total Protein: 6 g/dL — ABNORMAL LOW (ref 6.5–8.1)

## 2017-02-09 LAB — CBC
HCT: 39.2 % (ref 36.0–46.0)
Hemoglobin: 12.4 g/dL (ref 12.0–15.0)
MCH: 30.8 pg (ref 26.0–34.0)
MCHC: 31.6 g/dL (ref 30.0–36.0)
MCV: 97.5 fL (ref 78.0–100.0)
Platelets: 125 10*3/uL — ABNORMAL LOW (ref 150–400)
RBC: 4.02 MIL/uL (ref 3.87–5.11)
RDW: 14.6 % (ref 11.5–15.5)
WBC: 4.1 10*3/uL (ref 4.0–10.5)

## 2017-02-09 MED ORDER — MORPHINE SULFATE (PF) 4 MG/ML IV SOLN: 4.0000 mg | INTRAVENOUS | 0 refills | Status: DC | PRN

## 2017-02-09 MED ORDER — CIPROFLOXACIN IN D5W 400 MG/200ML IV SOLN: 400.0000 mg | Freq: Two times a day (BID) | INTRAVENOUS | 0 refills | Status: DC

## 2017-02-09 NOTE — Progress Notes (Signed)
Patient discharged to be transferred to [facility].  Patient sent with all personal belongings.  Called report to [facility] nurse at 11:00 am.

## 2017-02-09 NOTE — Discharge Summary (Addendum)
Physician Discharge Summary  [name] [name] MGQ:[id number] DOB: 1940/05/10 DOA: 02/05/2017  PCP: [name], MD  Admitted From: Home Disposition:  Weight for this [facility]  Accepting physician:  Dr. Emmit Alexanders (hospitalist)  Accepting gastroenterologist: Dr. Delrae Alfred   Equipment/Devices:None  Discharge Condition:Fair CODE STATUS:Full code Diet recommendation: Dysphagia level III    Discharge Diagnoses:  Principal Problem:   Cholestasis  Active Problems:   Transaminitis   Choledocholithiasis   Hypertension   GERD (gastroesophageal reflux disease)   Anxiety   Upper abdominal pain   Depression   Hypomagnesemia   Bradycardia   Abnormal CT scan, esophagus  Brief narrative/history of present illness Please refer to admission H&P for details, in brief, 76 year old obese female with hypertension, GERD, History of cholecystectomy and gastric bypass,anxiety and depression presented with right upper quadrant and epigastric colicky pain since the morning of admission, after eating her breakfast. She had nausea with multiple episodes of vomiting. In the ED her vitals were stable with labs showing significant transaminitis.CT of the abdomen and pelvis showing thickening of distal esophagus along with mild prominence of intra-and extremity biliary ducts (status post cholecystectomy). Patient admitted to hospitalist service and GI consulted. MRCP done showed 6 mm filling defect in the CBD.  Hospital course   Principal Problem:   Transaminitis With Possible acute choledocholithiasis. Patient was hospitalized in May of this year for similar presentation and had multiple workups including serology and liver biopsy  MRCP done showing dilated CBD with 6 mm filling defect suspicious of choledocholithiasis. LFTs Continue to improve in morning labs. (Normal AST, ALT of 110) . Abdominal pain improving and patient tolerating soft diet.  Patient will need ERCP for stone removal. However  with history of gastric bypass this is not possible here. I have spoken with GI (Dr.Rishi Pawa) at [facility] and he has accepted the patient. He recommends that since patient will likely need The procedure in the OR he can only be able to perform the procedure on 12/10 .  Discussed with hospitalist Drs. Sommers & Dr. Amil Amen and patient will beadmitted to The hospitalist service. Patient is on empiric IV ciprofloxacin since 12/6. -CDs Of abdominal CT scan and MRCP will be sent with the patient.  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  Nausea and vomiting Resolved      GERD (gastroesophageal reflux disease) Continue PPI  Sinus bradycardia Heart rate occasionally in the low 50s. Asymptomatic.  Essential hypertension Continue home blood pressure medications.  anxiety and depression  continue home medications  Hypomagnesemia  Replenished      Family Communication  : None at bedside  Disposition Plan  : To [place]  : GI  Procedures  :  CT abdomen and pelvis MRCP    Discharge Instructions   Allergies as of 02/09/2017      Reactions   Novocain [procaine Hcl]    Unknown-passed out with novocaine injected for dental surgery.   Other Hives   EKG pads - need to use pediatric pads   Latex Rash   Penicillins Rash   Has patient had a PCN reaction causing immediate rash, facial/tongue/throat swelling, SOB or lightheadedness with hypotension: Yes Has patient had a PCN reaction causing severe rash involving mucus membranes or skin necrosis: No Has patient had a PCN reaction that required hospitalization No Has patient had a PCN reaction occurring within the last 10 years: No  If all of  the above answers are "NO", then may proceed with Cephalosporin use.   Sulfa Antibiotics Rash      Medication List    STOP taking these medications    acetaminophen 325 MG tablet Commonly known as:  TYLENOL     TAKE these medications   albuterol (2.5 MG/3ML) 0.083% nebulizer solution Commonly known as:  PROVENTIL Take 3 mLs (2.5 mg total) by nebulization every 4 (four) hours as needed for wheezing. What changed:  when to take this   ciprofloxacin 400 MG/200ML Soln Commonly known as:  CIPRO Inject 200 mLs (400 mg total) into the vein every 12 (twelve) hours.   CITRACAL PETITES/VITAMIN D 200-250 MG-UNIT Tabs Generic drug:  Calcium Citrate-Vitamin D Take 1 tablet by mouth daily.   dextromethorphan-guaiFENesin 30-600 MG 12hr tablet Commonly known as:  MUCINEX DM Take 1 tablet by mouth 2 (two) times daily as needed for cough.   diazepam 5 MG tablet Commonly known as:  VALIUM Take 5 mg by mouth daily as needed for anxiety or muscle spasms. For anxiety   docusate sodium 100 MG capsule Commonly known as:  COLACE Take 100 mg by mouth at bedtime.   ferrous sulfate 325 (65 FE) MG tablet Take 325 mg by mouth daily with breakfast.   FLORAJEN ACIDOPHILUS PO Take 1 capsule by mouth every morning.   HAIR/SKIN/NAILS PO Take 1 tablet by mouth daily.   lisinopril-hydrochlorothiazide 20-12.5 MG tablet Commonly known as:  PRINZIDE,ZESTORETIC 1 tablet daily. Reported on 08/03/2015   morphine 4 MG/ML injection Inject 1 mL (4 mg total) into the vein every 4 (four) hours as needed for moderate pain or severe pain.   omeprazole 20 MG capsule Commonly known as:  PRILOSEC Take 20 mg by mouth 2 (two) times daily before a meal.   ondansetron 4 MG tablet Commonly known as:  ZOFRAN Take 4 mg by mouth every 8 (eight) hours as needed for nausea or vomiting.   potassium chloride 10 MEQ tablet Commonly known as:  K-DUR Take 10 mEq by mouth 3 (three) times daily with meals.   sertraline 100 MG tablet Commonly known as:  ZOLOFT Take 100 mg by mouth daily.   traZODone 50 MG tablet Commonly known as:  DESYREL Take 50 mg by mouth at  bedtime.   vitamin B-12 100 MCG tablet Commonly known as:  CYANOCOBALAMIN Take 500 mcg by mouth daily.       Allergies  Allergen Reactions  . Novocain [Procaine Hcl]     Unknown-passed out with novocaine injected for dental surgery.  . Other Hives    EKG pads - need to use pediatric pads  . Latex Rash  . Penicillins Rash    Has patient had a PCN reaction causing immediate rash, facial/tongue/throat swelling, SOB or lightheadedness with hypotension: Yes Has patient had a PCN reaction causing severe rash involving mucus membranes or skin necrosis: No Has patient had a PCN reaction that required hospitalization No Has patient had a PCN reaction occurring within the last 10 years: No  If all of the above answers are "NO", then may proceed with Cephalosporin use.   . Sulfa Antibiotics Rash      Procedures/Studies: Ct Abdomen Pelvis W Contrast  Result Date: 02/05/2017 CLINICAL DATA:  76 year old female post gastric bypass procedure several years ago. Stomach problems over the past 5 years. Nausea and vomiting since this morning. Prior cholecystectomy appendectomy and hysterectomy. Initial encounter. EXAM: CT ABDOMEN AND PELVIS WITH CONTRAST TECHNIQUE: Multidetector CT imaging of the abdomen  and pelvis was performed using the standard protocol following bolus administration of intravenous contrast. CONTRAST:  133mL ISOVUE-300 IOPAMIDOL (ISOVUE-300) INJECTION 61% COMPARISON:  05/29/2015 CT. FINDINGS: Lower chest: Minimal basilar scarring. Left base calcified granuloma. Heart size top-normal. Hepatobiliary: Top-normal size liver without focal worrisome mass. Post cholecystectomy. Mild prominence of intrahepatic and extrahepatic biliary ducts may represent a normal finding in a patient of this age who is post cholecystectomy (assuming normal liver enzymes). Pancreas: Fatty infiltration of the pancreas without pancreatic mass or inflammation. Spleen: Splenomegaly with spleen spanning over 13.5  cm. Tiny calcified granulomas. Prominent splenic vessels. Adrenals/Urinary Tract: No obstructing stone or hydronephrosis. No worrisome renal or adrenal mass. Evaluation urinary bladder limited by streak artifact from hip replacements. Stomach/Bowel: Post gastric bypass procedure. Circumferential thickening distal esophagus. Although under distension may partially explain this, underlying mass or result of esophagitis cannot be excluded. No findings of internal hernia as can be seen post gastric bypass. Evaluation of bowel within the pelvis limited by streak artifact from hip replacements. Scattered colonic diverticula without extraluminal bowel inflammatory process identified. Vascular/Lymphatic: Tortuous lower thoracic aorta with prominent fold. Very mild calcified plaque abdominal aorta without aneurysm or large vessel occlusion. No adenopathy. Reproductive: Post hysterectomy.  No obvious adnexal mass. Other: 2 anterior abdominal wall fat and vessel containing hernias. No bowel containing hernia. No free air. Musculoskeletal: Post bilateral hip replacements causing significant artifact. Fusion lower thoracic and upper lumbar spine. Kyphosis in this region. Degenerative changes L2-3 through L5-S1. IMPRESSION: Circumferential thickening distal esophagus. Although under distension may partially explain this, underlying mass or result of esophagitis cannot be excluded. No findings of internal hernia as can be seen post gastric bypass. Evaluation of bowel within the pelvis limited by streak artifact from hip replacements. Scattered colonic diverticula without extraluminal bowel inflammatory process identified. 2 anterior abdominal wall fat and vessel containing hernias. No bowel containing hernia. Post cholecystectomy. Mild prominence of intrahepatic and extrahepatic biliary ducts may represent a normal finding in a patient of this age who is post cholecystectomy (assuming normal liver enzymes). Splenomegaly with  spleen spanning over 13.5 cm similar to prior exam. Aortic Atherosclerosis (ICD10-I70.0). Post hysterectomy. Post bilateral hip replacement. Degenerative change lower thoracic lumbar spine. Electronically Signed   By: Genia Del M.D.   On: 02/05/2017 18:30   Mr 3d Recon At Scanner  Result Date: 02/06/2017 CLINICAL DATA:  Nausea and vomiting starting this morning. EXAM: MRI ABDOMEN WITHOUT AND WITH CONTRAST (INCLUDING MRCP) TECHNIQUE: Multiplanar multisequence MR imaging of the abdomen was performed both before and after the administration of intravenous contrast. Heavily T2-weighted images of the biliary and pancreatic ducts were obtained, and three-dimensional MRCP images were rendered by post processing. CONTRAST:  61mL MULTIHANCE GADOBENATE DIMEGLUMINE 529 MG/ML IV SOLN COMPARISON:  02/05/2017 FINDINGS: Despite efforts by the technologist and patient, motion artifact is present on today's exam and could not be eliminated. This reduces exam sensitivity and specificity. Lower chest: Unremarkable Hepatobiliary: I am suspicious for an approximately 6 mm stone/ filling defect in the distal common bile duct as on image 75/10. This is difficult to corroborate and confirm given the severity of motion artifact, and could conceivably be a false-positive. Prior cholecystectomy noted. No obvious abnormal enhancement along the biliary tree or in the hepatic parenchyma. The common bile duct measures approximately 10 mm in diameter. Pancreas:  Unremarkable Spleen:  Unremarkable Adrenals/Urinary Tract:  Unremarkable Stomach/Bowel: Postoperative findings along the stomach. Otherwise unremarkable Vascular/Lymphatic: Aortoiliac atherosclerotic vascular disease. No pathologic adenopathy. Other:  No supplemental  non-categorized findings. Musculoskeletal: Dextroconvex thoracolumbar scoliosis with lumbar spondylosis and degenerative disc disease causing considerable multilevel impingement. IMPRESSION: 1. Mildly dilated common  bile duct with suspected choledocholithiasis, with a 6 mm apparent filling defect on the MRCP images. These images are severely degraded by motion artifact, as are other sequences, resulting in reduced diagnostic accuracy. Some of the biliary dilatation might be attributable to physiologic effects of prior cholecystectomy. 2.  Aortoiliac atherosclerotic vascular disease. 3. Lumbar spondylosis and degenerative disc disease causing multilevel impingement. 4. Postoperative findings along the stomach. Electronically Signed   By: Van Clines M.D.   On: 02/06/2017 14:01   Mr Abdomen Mrcp Moise Boring Contast  Result Date: 02/06/2017 CLINICAL DATA:  Nausea and vomiting starting this morning. EXAM: MRI ABDOMEN WITHOUT AND WITH CONTRAST (INCLUDING MRCP) TECHNIQUE: Multiplanar multisequence MR imaging of the abdomen was performed both before and after the administration of intravenous contrast. Heavily T2-weighted images of the biliary and pancreatic ducts were obtained, and three-dimensional MRCP images were rendered by post processing. CONTRAST:  34mL MULTIHANCE GADOBENATE DIMEGLUMINE 529 MG/ML IV SOLN COMPARISON:  02/05/2017 FINDINGS: Despite efforts by the technologist and patient, motion artifact is present on today's exam and could not be eliminated. This reduces exam sensitivity and specificity. Lower chest: Unremarkable Hepatobiliary: I am suspicious for an approximately 6 mm stone/ filling defect in the distal common bile duct as on image 75/10. This is difficult to corroborate and confirm given the severity of motion artifact, and could conceivably be a false-positive. Prior cholecystectomy noted. No obvious abnormal enhancement along the biliary tree or in the hepatic parenchyma. The common bile duct measures approximately 10 mm in diameter. Pancreas:  Unremarkable Spleen:  Unremarkable Adrenals/Urinary Tract:  Unremarkable Stomach/Bowel: Postoperative findings along the stomach. Otherwise unremarkable  Vascular/Lymphatic: Aortoiliac atherosclerotic vascular disease. No pathologic adenopathy. Other:  No supplemental non-categorized findings. Musculoskeletal: Dextroconvex thoracolumbar scoliosis with lumbar spondylosis and degenerative disc disease causing considerable multilevel impingement. IMPRESSION: 1. Mildly dilated common bile duct with suspected choledocholithiasis, with a 6 mm apparent filling defect on the MRCP images. These images are severely degraded by motion artifact, as are other sequences, resulting in reduced diagnostic accuracy. Some of the biliary dilatation might be attributable to physiologic effects of prior cholecystectomy. 2.  Aortoiliac atherosclerotic vascular disease. 3. Lumbar spondylosis and degenerative disc disease causing multilevel impingement. 4. Postoperative findings along the stomach. Electronically Signed   By: Van Clines M.D.   On: 02/06/2017 14:01      Subjective: Still has some right upper quadrant abdominal pain but improved from previous days. No nausea or vomiting.  Discharge Exam: Vitals:   02/08/17 2000 02/09/17 0500  BP: 129/65 (!) 155/66  Pulse: (!) 49 (!) 48  Resp: 18 18  Temp: 98.6 F (37 C) 97.8 F (36.6 C)  SpO2: 97% 98%   Vitals:   02/08/17 0407 02/08/17 1322 02/08/17 2000 02/09/17 0500  BP: 139/65 (!) 126/56 129/65 (!) 155/66  Pulse: 81 (!) 51 (!) 49 (!) 48  Resp: 20 16 18 18   Temp: 98.8 F (37.1 C) 98.2 F (36.8 C) 98.6 F (37 C) 97.8 F (36.6 C)  TempSrc:  Oral Oral Oral  SpO2: 100% 98% 97% 98%  Weight:      Height:        Gen.: Elderly obese female not in distress HEENT: Moist mucosa, supple neck Chest: Clear bilaterally CVS: S1 and S2 bradycardic, no murmurs GI: Soft, nondistended, bowel sounds present, minimal right upper quadrant tenderness Musculaoskeletal: Warm, no edema  The results of significant diagnostics from this hospitalization (including imaging, microbiology, ancillary and laboratory)  are listed below for reference.     Microbiology: No results found for this or any previous visit (from the past 240 hour(s)).   Labs: BNP (last 3 results) No results for input(s): BNP in the last 8760 hours. Basic Metabolic Panel: Recent Labs  Lab 02/05/17 1446 02/05/17 1959 02/06/17 0556 02/07/17 0451 02/08/17 0415 02/09/17 0600  NA 140  --  137 140 137 140  K 3.7  --  3.5 3.9 3.3* 3.6  CL 107  --  105 106 104 106  CO2 26  --  26 25 27 27   GLUCOSE 153*  --  103* 102* 95 97  BUN 23*  --  15 12 10 6   CREATININE 1.06*  --  0.92 0.91 0.93 1.04*  CALCIUM 9.0  --  8.4* 8.7* 8.4* 8.5*  MG  --  1.6*  --   --   --   --    Liver Function Tests: Recent Labs  Lab 02/05/17 1446 02/06/17 0556 02/07/17 0451 02/08/17 0415 02/09/17 0600  AST 645* 482* 173* 59* 33  ALT 242* 357* 242* 146* 110*  ALKPHOS 357* 332* 350* 273* 276*  BILITOT 1.6* 1.4* 0.9 0.6 0.7  PROT 6.6 5.6* 5.9* 5.4* 6.0*  ALBUMIN 4.1 3.4* 3.5 3.3* 3.7   Recent Labs  Lab 02/05/17 1446  LIPASE 24   No results for input(s): AMMONIA in the last 168 hours. CBC: Recent Labs  Lab 02/05/17 1446 02/06/17 0556 02/07/17 0451 02/08/17 0415 02/09/17 0600  WBC 5.4 3.7* 3.8* 3.6* 4.1  NEUTROABS 4.2 2.5 2.4 2.1  --   HGB 12.8 11.7* 12.4 11.6* 12.4  HCT 40.6 36.9 39.6 36.5 39.2  MCV 97.4 97.1 98.0 97.6 97.5  PLT 112* 119* 124* 111* 125*   Cardiac Enzymes: Recent Labs  Lab 02/06/17 0556  CKTOTAL 52   BNP: Invalid input(s): POCBNP CBG: No results for input(s): GLUCAP in the last 168 hours. D-Dimer No results for input(s): DDIMER in the last 72 hours. Hgb A1c No results for input(s): HGBA1C in the last 72 hours. Lipid Profile No results for input(s): CHOL, HDL, LDLCALC, TRIG, CHOLHDL, LDLDIRECT in the last 72 hours. Thyroid function studies No results for input(s): TSH, T4TOTAL, T3FREE, THYROIDAB in the last 72 hours.  Invalid input(s): FREET3 Anemia work up No results for input(s): VITAMINB12, FOLATE,  FERRITIN, TIBC, IRON, RETICCTPCT in the last 72 hours. Urinalysis    Component Value Date/Time   COLORURINE YELLOW 02/05/2017 1446   APPEARANCEUR CLEAR 02/05/2017 1446   LABSPEC 1.016 02/05/2017 1446   PHURINE 5.0 02/05/2017 1446   GLUCOSEU NEGATIVE 02/05/2017 1446   HGBUR NEGATIVE 02/05/2017 1446   BILIRUBINUR NEGATIVE 02/05/2017 1446   KETONESUR NEGATIVE 02/05/2017 1446   PROTEINUR NEGATIVE 02/05/2017 1446   UROBILINOGEN 0.2 12/28/2013 1310   NITRITE NEGATIVE 02/05/2017 1446   LEUKOCYTESUR MODERATE (A) 02/05/2017 1446   Sepsis Labs Invalid input(s): PROCALCITONIN,  WBC,  LACTICIDVEN Microbiology No results found for this or any previous visit (from the past 240 hour(s)).   Time coordinating discharge: Over 30 minutes  SIGNED:   Louellen Molder, MD  [facility] 02/09/2017, 12:27 PM Pager   If 7PM-7AM, please contact night-coverage [email or web address] Password [email or web address]

## 2017-02-15 MED ORDER — ACETAMINOPHEN 500 MG PO TABS
500.00 mg | ORAL_TABLET | ORAL | Status: DC
Start: ? — End: 2017-02-15

## 2017-02-15 MED ORDER — ONDANSETRON 4 MG PO TBDP
4.00 mg | ORAL_TABLET | ORAL | Status: DC
Start: ? — End: 2017-02-15

## 2017-02-15 MED ORDER — PANTOPRAZOLE SODIUM 40 MG PO TBEC
40.00 mg | DELAYED_RELEASE_TABLET | ORAL | Status: DC
Start: 2017-02-16 — End: 2017-02-15

## 2017-02-15 MED ORDER — BENZOCAINE-MENTHOL 6-10 MG MT LOZG
1.00 | LOZENGE | OROMUCOSAL | Status: DC
Start: ? — End: 2017-02-15

## 2017-02-15 MED ORDER — DIAZEPAM 5 MG PO TABS
5.00 mg | ORAL_TABLET | ORAL | Status: DC
Start: ? — End: 2017-02-15

## 2017-02-15 MED ORDER — TRAMADOL HCL 50 MG PO TABS
50.00 mg | ORAL_TABLET | ORAL | Status: DC
Start: ? — End: 2017-02-15

## 2017-02-15 MED ORDER — HYDROXYZINE HCL 25 MG PO TABS
25.00 mg | ORAL_TABLET | ORAL | Status: DC
Start: ? — End: 2017-02-15

## 2017-02-15 MED ORDER — OXYCODONE HCL 5 MG PO TABS
5.00 mg | ORAL_TABLET | ORAL | Status: DC
Start: ? — End: 2017-02-15

## 2017-02-15 MED ORDER — SERTRALINE HCL 50 MG PO TABS
100.00 mg | ORAL_TABLET | ORAL | Status: DC
Start: 2017-02-16 — End: 2017-02-15

## 2017-02-15 MED ORDER — GUAIFENESIN ER 600 MG PO TB12
600.00 mg | ORAL_TABLET | ORAL | Status: DC
Start: ? — End: 2017-02-15

## 2017-02-15 MED ORDER — TRAZODONE HCL 50 MG PO TABS
50.00 mg | ORAL_TABLET | ORAL | Status: DC
Start: 2017-02-15 — End: 2017-02-15

## 2017-02-15 MED ORDER — DOCUSATE SODIUM 100 MG PO CAPS
100.00 mg | ORAL_CAPSULE | ORAL | Status: DC
Start: ? — End: 2017-02-15

## 2017-03-29 ENCOUNTER — Encounter (INDEPENDENT_AMBULATORY_CARE_PROVIDER_SITE_OTHER): Payer: Self-pay | Admitting: *Deleted

## 2017-03-29 ENCOUNTER — Other Ambulatory Visit (INDEPENDENT_AMBULATORY_CARE_PROVIDER_SITE_OTHER): Payer: Self-pay | Admitting: *Deleted

## 2017-03-29 DIAGNOSIS — K760 Fatty (change of) liver, not elsewhere classified: Secondary | ICD-10-CM

## 2017-04-02 ENCOUNTER — Emergency Department (HOSPITAL_COMMUNITY)
Admission: EM | Admit: 2017-04-02 | Discharge: 2017-04-02 | Disposition: A | Discharge status: Home / Self Care | Payer: Medicare Other | Attending: Emergency Medicine | Admitting: Emergency Medicine

## 2017-04-02 ENCOUNTER — Emergency Department (HOSPITAL_COMMUNITY): Payer: Medicare Other

## 2017-04-02 ENCOUNTER — Other Ambulatory Visit: Payer: Self-pay

## 2017-04-02 ENCOUNTER — Encounter (HOSPITAL_COMMUNITY): Payer: Self-pay | Admitting: Emergency Medicine

## 2017-04-02 DIAGNOSIS — Y939 Activity, unspecified: Secondary | ICD-10-CM | POA: Diagnosis not present

## 2017-04-02 DIAGNOSIS — Z91040 Latex allergy status: Secondary | ICD-10-CM | POA: Insufficient documentation

## 2017-04-02 DIAGNOSIS — Y92003 Bedroom of unspecified non-institutional (private) residence as the place of occurrence of the external cause: Secondary | ICD-10-CM | POA: Diagnosis not present

## 2017-04-02 DIAGNOSIS — Z96643 Presence of artificial hip joint, bilateral: Secondary | ICD-10-CM | POA: Insufficient documentation

## 2017-04-02 DIAGNOSIS — M79601 Pain in right arm: Secondary | ICD-10-CM | POA: Insufficient documentation

## 2017-04-02 DIAGNOSIS — S0083XA Contusion of other part of head, initial encounter: Secondary | ICD-10-CM | POA: Insufficient documentation

## 2017-04-02 DIAGNOSIS — M79604 Pain in right leg: Secondary | ICD-10-CM | POA: Diagnosis not present

## 2017-04-02 DIAGNOSIS — I10 Essential (primary) hypertension: Secondary | ICD-10-CM | POA: Insufficient documentation

## 2017-04-02 DIAGNOSIS — W06XXXA Fall from bed, initial encounter: Secondary | ICD-10-CM | POA: Diagnosis not present

## 2017-04-02 DIAGNOSIS — Y999 Unspecified external cause status: Secondary | ICD-10-CM | POA: Insufficient documentation

## 2017-04-02 DIAGNOSIS — W19XXXA Unspecified fall, initial encounter: Secondary | ICD-10-CM

## 2017-04-02 DIAGNOSIS — S0990XA Unspecified injury of head, initial encounter: Secondary | ICD-10-CM | POA: Insufficient documentation

## 2017-04-02 MED ORDER — TRAMADOL HCL 50 MG PO TABS: 50.0000 mg | ORAL_TABLET | Freq: Four times a day (QID) | ORAL | 0 refills | Status: DC | PRN

## 2017-04-02 MED ORDER — OXYCODONE-ACETAMINOPHEN 5-325 MG PO TABS
1.0000 | ORAL_TABLET | Freq: Once | ORAL | Status: AC
  Administered 2017-04-02: 1 via ORAL
  Filled 2017-04-02: qty 1

## 2017-04-02 NOTE — ED Notes (Signed)
Pt walked with use of walker to restroom with steady and even gait.

## 2017-04-02 NOTE — ED Provider Notes (Signed)
Emergency Department Provider Note   I have reviewed the triage vital signs and the nursing notes.   HISTORY  Chief Complaint Fall   HPI [name] [name] is a 77 y.o. female with PMH of GERD, hiatal hernia, chronic MSK pain, and recent admission at [facility] for transaminitis and choledocolithiasis presents to the emergency department for evaluation after mechanical fall.  Patient was at home when she slid off the bed when trying to go the bathroom.  She landed on the right side and noticed pain and swelling multiple areas on the right including the shoulder, leg, and head.  There was no loss of consciousness.  The patient is not anticoagulated.  She was ultimately able to call family who were nearby and alerted EMS.  She denies any unilateral weakness or numbness.  No tingling in the extremities.  Denies any chest pain or dyspnea. No abdominal pain, nausea, or vomiting.    Past Medical History:  Diagnosis Date  . Anemia   . Anxiety   . Arthritis   . Barrett's esophagus    seen on 2016 egd at [facility]  . Depression   . GERD (gastroesophageal reflux disease)   . H/O hiatal hernia   . Hypertension   . Obesity   . Pneumonia   . PONV (postoperative nausea and vomiting)   . Seasonal allergies   . Sepsis ([place]) 10/02/2013    Patient Active Problem List   Diagnosis Date Noted  . Choledocholithiasis   . Abnormal CT scan, esophagus   . Cholestasis 02/05/2017  . Depression 02/05/2017  . Hypomagnesemia 02/05/2017  . Bradycardia 02/05/2017  . Transaminitis 07/08/2016  . Constipation 07/08/2016  . GIB (gastrointestinal bleeding) 07/08/2016  . HCAP (healthcare-associated pneumonia) 09/23/2015  . AKI (acute kidney injury) ([place]) 07/21/2015  . Hypokalemia 07/21/2015  . CAP (community acquired pneumonia) 07/14/2015  . Upper abdominal pain 06/10/2014  . Folliculitis of perineum 02/24/2014  . Giant comedone 12/21/2013  . 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 Acrochordon  11/25/2013  . Dysphagia 10/29/2013  . Morbid obesity ([place]) 10/02/2013  . Hypertension   . GERD (gastroesophageal reflux disease)   . Anxiety   . Anemia of chronic disease   . 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 ABDOMINAL HYSTERECTOMY    . ABDOMINAL SURGERY    . APPENDECTOMY    . BACK SURGERY     lumbar x2 by Dr Arnoldo Morale  . CARPAL TUNNEL RELEASE Right 03/2014   Dr. Lorin Mercy  . CATARACT EXTRACTION W/PHACO Left 08/06/2013   Procedure: CATARACT EXTRACTION PHACO AND INTRAOCULAR LENS PLACEMENT LEFT EYE;  Surgeon: Tonny Branch, MD;  Location: [facility];  Service: Ophthalmology;  Laterality: Left;  CDE: 9.55  . CATARACT EXTRACTION W/PHACO Right 08/24/2013   Procedure: CATARACT EXTRACTION PHACO AND INTRAOCULAR LENS PLACEMENT (IOC);  Surgeon: Tonny Branch, MD;  Location: [facility];  Service: Ophthalmology;  Laterality: Right;  CDE:8.30  . CERVICAL FUSION    . CHOLECYSTECTOMY    . COLONOSCOPY N/A 11/03/2013   SLF: 1. Normal mucosa in the terminal ileum. 2. 13 colon polyps removed. 3. Moderate diverticulosis in the descending colon and sigmoid colon 4. the left colon is redundant 5. Small internal  hemorrhoids.  . ESOPHAGOGASTRODUODENOSCOPY N/A 11/03/2013   SLF: 1. 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 Mild Non-erosive gastritis  . ESOPHAGOGASTRODUODENOSCOPY  10/2014   [facility]: IMPRESSIONS: -  likely small distal esophageal diverticulum without evidence of fistula, +barrett's esophagus without dysplasia. s/p gastric bypass  . EYE SURGERY    . GASTRIC BYPASS  1979   revision in 1980   . HIATAL HERNIA REPAIR     [facility] in 2011  . JOINT REPLACEMENT Right 1995   hip  . JOINT REPLACEMENT Left 2008   hip  . MEDIAL PARTIAL KNEE REPLACEMENT Left    Dr Ronnie Derby  . PARAESOPHAGEAL HERNIA REPAIR  SEP 2010 DR. MCNATT  . SHOULDER ARTHROSCOPY Right   . SHOULDER ARTHROSCOPY Right    x2  . SKIN  BIOPSY    . TONSILLECTOMY    . TOTAL SHOULDER ARTHROPLASTY Right 02/16/2013   Procedure: TOTAL SHOULDER ARTHROPLASTY;  Surgeon: Marybelle Killings, MD;  Location: [facility];  Service: Orthopedics;  Laterality: Right;  Right Total Shoulder Arthroplasty, Cemented    Current Outpatient Rx  . Order #: [id number] Class: Historical Med  . Order #: [id number] Class: Historical Med  . Order #: [id number] Class: Historical Med  . Order #: [id number] Class: Historical Med  . Order #: [id number] Class: Historical Med  . Order #: [id number] Class: Historical Med  . Order #: [id number] Class: Print  . Order #: [id number] Class: Historical Med  . Order #: [id number] Class: Historical Med  . Order #: [id number] Class: Normal  . Order #: [id number] Class: Historical Med  . Order #: [id number] Class: Historical Med  . Order #: [id number] Class: Historical Med  . Order #: [id number] Class: Historical Med  . Order #: [id number] Class: Historical Med  . Order #: [id number] Class: Normal  . Order #: [id number] Class: Print  . Order #: [id number] Class: Historical Med    Allergies Novocain [procaine hcl]; Other; Latex; Penicillins; and Sulfa antibiotics  Family History  Problem Relation Age of Onset  . Colon cancer Brother        IN HIS 60s-METASTATIC  . Colon polyps Paternal Grandfather   . Breast cancer Mother   . Hypertension Father   . Heart disease Father   . Hypertension Brother   . Heart disease Brother     Social History Social History   Tobacco Use  . Smoking status: Never Smoker  . Smokeless tobacco: Never Used  Substance Use Topics  . Alcohol use: No  . Drug use: No    Review of Systems  Constitutional: No fever/chills Eyes: No visual changes. ENT: No sore throat. Positive right jaw pain and bruising.  Cardiovascular: Denies chest pain. Respiratory: Denies shortness of breath. Gastrointestinal: No abdominal pain.  No nausea, no vomiting.  No diarrhea.  No constipation. Genitourinary: Negative for  dysuria. Musculoskeletal: Negative for back pain. Positive right shoulder/elbow pain, right knee/hip pain.  Skin: Negative for rash. Neurological: Negative for focal weakness or numbness. Positive HA.   10-point ROS otherwise negative.  ____________________________________________   PHYSICAL EXAM:  VITAL SIGNS: ED Triage Vitals  Enc Vitals Group     BP 04/02/17 1113 (!) 140/52     Pulse Rate 04/02/17 1113 73     Resp 04/02/17 1113 16     Temp 04/02/17 1113 98.3 F (36.8 C)     Temp Source 04/02/17 1113 Oral     SpO2 04/02/17 1113 96 %     Weight 04/02/17 1114 230 lb (104.3 kg)     Pain Score 04/02/17 1113 8   Constitutional: Alert and oriented. Well appearing and in no acute distress. Eyes: Conjunctivae are normal. PERRL. Head: Bruising and small hematoma to the right parietal scalp without laceration/abrasion.  Nose: No congestion/rhinnorhea. Mouth/Throat: Mucous membranes are  moist. Positive right lateral mandible bruising.  Neck: No stridor. No cervical spine tenderness to palpation. Cardiovascular: Normal rate, regular rhythm. Good peripheral circulation. Grossly normal heart sounds.   Respiratory: Normal respiratory effort.  No retractions. Lungs CTAB. Gastrointestinal: Soft with mild diffuse tenderness s/p abdominal surgery. No distention.  Musculoskeletal: Positive right anterior shoulder tenderness and mild right elbow tenderness. Normal ROM of the shoulder/elbow, and wrist on the right. Mild pain with passive ROM of the right hip and knee. No gross deformity or bruising.  Neurologic:  Normal speech and language. No gross focal neurologic deficits are appreciated.  Skin:  Skin is warm, dry and intact. No rash noted.  ____________________________________________  RADIOLOGY  Dg Chest 2 View  Result Date: 04/02/2017 CLINICAL DATA:  Fall.  Pain. EXAM: CHEST  2 VIEW COMPARISON:  Chest x-ray 12/20/2016. FINDINGS: Mediastinum and hilar structures normal. Cardiomegaly with  normal pulmonary vascularity. Mild basilar atelectasis. No pleural effusion or pneumothorax. Surgical clips upper abdomen. Right shoulder replacement. Prior cervical spine fusion. IMPRESSION: Low lung volumes with mild basilar atelectasis. No acute infiltrate. Electronically Signed   By: Marcello Moores  Register   On: 04/02/2017 13:22   Dg Shoulder Right  Result Date: 04/02/2017 CLINICAL DATA:  status post fall.  Right shoulder pain EXAM: RIGHT SHOULDER - 2+ VIEW COMPARISON:  04/11/2013 FINDINGS: Previous right shoulder arthroplasty. No dislocation. No periprosthetic fracture. IMPRESSION: 1. No acute findings. 2. Previous right shoulder arthroplasty. Electronically Signed   By: Kerby Moors M.D.   On: 04/02/2017 13:04   Dg Elbow 2 Views Right  Result Date: 04/02/2017 CLINICAL DATA:  Fall.  Elbow pain. EXAM: RIGHT ELBOW - 2 VIEW COMPARISON:  Right humerus radiographs 04/11/2013 FINDINGS: There is no evidence of fracture, dislocation, or joint effusion. There is no evidence of arthropathy or other focal bone abnormality. Soft tissues are unremarkable. IMPRESSION: Negative right elbow radiographs. Electronically Signed   By: San Morelle M.D.   On: 04/02/2017 13:09   Dg Knee 2 Views Right  Result Date: 04/02/2017 CLINICAL DATA:  Knee pain.  Fall today.  Initial encounter. EXAM: RIGHT KNEE - 1-2 VIEW COMPARISON:  12/20/2016 FINDINGS: No acute fracture, dislocation, or knee joint effusion is identified. Tricompartmental osteoarthrosis does not appear significantly changed, including in the medial compartment where there is severe joint space narrowing. Varicose veins and vascular clips are noted. IMPRESSION: Tricompartmental osteoarthrosis without acute osseous abnormality identified. Electronically Signed   By: Logan Bores M.D.   On: 04/02/2017 13:11   Ct Head Wo Contrast  Result Date: 04/02/2017 CLINICAL DATA:  Fall. Right head and chin hematoma. Initial encounter. EXAM: CT HEAD WITHOUT CONTRAST CT  MAXILLOFACIAL WITHOUT CONTRAST CT CERVICAL SPINE WITHOUT CONTRAST TECHNIQUE: Multidetector CT imaging of the head, cervical spine, and maxillofacial structures were performed using the standard protocol without intravenous contrast. Multiplanar CT image reconstructions of the cervical spine and maxillofacial structures were also generated. COMPARISON:  Cervical spine MRI 06/11/2013 and cervical spine CT 11/01/2009. Head CT 05/16/2009. FINDINGS: CT HEAD FINDINGS Brain: There is no evidence of acute infarct, intracranial hemorrhage, midline shift, or extra-axial fluid collection. The ventricles and sulci are normal for age. There is thinning of the floor of the sella on the right with the appearance of sellar soft tissue extending inferiorly through the floor into the upper clivus posterior to the right sphenoid sinus. This reflects a change from the 05/16/2009 head CT, and there is asymmetric right-sided pituitary enlargement on the 2015 cervical spine MRI. Vascular: Calcified atherosclerosis at the skull  base. No hyperdense vessel. Skull: No skull fracture or focal osseous lesion. Other: Small lateral right frontal scalp hematoma. CT MAXILLOFACIAL FINDINGS Osseous: No fracture, suspicious osseous lesion, or mandibular dislocation. Orbits: Bilateral cataract extraction. No acute traumatic or inflammatory finding. Sinuses: Trace left sphenoid sinus mucosal thickening. No sinus fluid level. Minimal chronic right mastoid air cell opacification. Soft tissues: Right lateral frontal scalp hematoma. Mild soft tissue swelling in the right lower face and chin region. CT CERVICAL SPINE FINDINGS Alignment: Slight anterolisthesis of C7 on T1, likely degenerative. Skull base and vertebrae: No acute fracture. 4 mm densely sclerotic focus in the left C2 facet, new from 2011 though likely corresponding to a small focus of low signal on the 2015 MRI and indeterminate. No destructive osseous process. Soft tissues and spinal canal: No  prevertebral fluid or swelling. No visible canal hematoma. Disc levels: C4-C7 ACDF with solid osseous fusion. Progressive severe disc space narrowing at C7-T1 with suspected degenerative interbody ankylosis. Mild disc space narrowing at C3-4. Mild facet arthrosis at C7-T1. Upper chest: Clear lung apices. Other: None. IMPRESSION: 1. No evidence of acute intracranial abnormality. 2. Small right lateral scalp hematoma. Mild right-sided facial swelling. 3. No acute maxillofacial or cervical spine fracture identified. 4. 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 Referral for endocrine and/or neurosurgical management is recommended. This follows ACR consensus guidelines: Management of Incidental Pituitary Findings on CT, MRI and F18-FDG PET: A White Paper of the ACR Incidental Findings Committee. J Am Coll Radiol 2018; 15: 244-01. Electronically Signed   By: Logan Bores M.D.   On: 04/02/2017 13:52   Ct Cervical Spine Wo Contrast  Result Date: 04/02/2017 CLINICAL DATA:  Fall. Right head and chin hematoma. Initial encounter. EXAM: CT HEAD WITHOUT CONTRAST CT MAXILLOFACIAL WITHOUT CONTRAST CT CERVICAL SPINE WITHOUT CONTRAST TECHNIQUE: Multidetector CT imaging of the head, cervical spine, and maxillofacial structures were performed using the standard protocol without intravenous contrast. Multiplanar CT image reconstructions of the cervical spine and maxillofacial structures were also generated. COMPARISON:  Cervical spine MRI 06/11/2013 and cervical spine CT 11/01/2009. Head CT 05/16/2009. FINDINGS: CT HEAD FINDINGS Brain: There is no evidence of acute infarct, intracranial hemorrhage, midline shift, or extra-axial fluid collection. The ventricles and sulci are normal for age. There is thinning of the floor of the sella on the right with the appearance of sellar soft tissue extending inferiorly through the floor into the upper clivus posterior to the right  sphenoid sinus. This reflects a change from the 05/16/2009 head CT, and there is asymmetric right-sided pituitary enlargement on the 2015 cervical spine MRI. Vascular: Calcified atherosclerosis at the skull base. No hyperdense vessel. Skull: No skull fracture or focal osseous lesion. Other: Small lateral right frontal scalp hematoma. CT MAXILLOFACIAL FINDINGS Osseous: No fracture, suspicious osseous lesion, or mandibular dislocation. Orbits: Bilateral cataract extraction. No acute traumatic or inflammatory finding. Sinuses: Trace left sphenoid sinus mucosal thickening. No sinus fluid level. Minimal chronic right mastoid air cell opacification. Soft tissues: Right lateral frontal scalp hematoma. Mild soft tissue swelling in the right lower face and chin region. CT CERVICAL SPINE FINDINGS Alignment: Slight anterolisthesis of C7 on T1, likely degenerative. Skull base and vertebrae: No acute fracture. 4 mm densely sclerotic focus in the left C2 facet, new from 2011 though likely corresponding to a small focus of low signal on the 2015 MRI and indeterminate. No destructive osseous process. Soft tissues and spinal canal: No prevertebral fluid or swelling. No visible canal hematoma.  Disc levels: C4-C7 ACDF with solid osseous fusion. Progressive severe disc space narrowing at C7-T1 with suspected degenerative interbody ankylosis. Mild disc space narrowing at C3-4. Mild facet arthrosis at C7-T1. Upper chest: Clear lung apices. Other: None. IMPRESSION: 1. No evidence of acute intracranial abnormality. 2. Small right lateral scalp hematoma. Mild right-sided facial swelling. 3. No acute maxillofacial or cervical spine fracture identified. 4. 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 Referral for endocrine and/or neurosurgical management is recommended. This follows ACR consensus guidelines: Management of Incidental Pituitary Findings on CT, MRI and F18-FDG PET: A  White Paper of the ACR Incidental Findings Committee. J Am Coll Radiol 2018; 15: 591-63. Electronically Signed   By: Logan Bores M.D.   On: 04/02/2017 13:52   Dg Hip Unilat W Or Wo Pelvis 2-3 Views Right  Result Date: 04/02/2017 CLINICAL DATA:  77 year old female with a history of right hip pain after a fall EXAM: DG HIP (WITH OR WITHOUT PELVIS) 2-3V RIGHT COMPARISON:  CT abdomen 02/05/2017 FINDINGS: Osteopenia. Bony pelvic ring appears intact, with no acute displaced fracture. Degenerative changes of the lower lumbar spine. Surgical changes of bilateral hip arthroplasty. Surgical clips of the abdomen in the proximal left thigh. Right hip appears congruent, with congruent femoroacetabular components. Ill-defined calcifications in the soft tissues of the proximal thigh. There is a margin of lucency around the distal femoral stem. No fracture. IMPRESSION: No acute bony abnormality. Surgical changes of bilateral hip arthroplasty. There is a margin of lucency at the right femoral stem which may indicate loosening. Osteopenia. Electronically Signed   By: Corrie Mckusick D.O.   On: 04/02/2017 14:44   Ct Maxillofacial Wo Contrast  Result Date: 04/02/2017 CLINICAL DATA:  Fall. Right head and chin hematoma. Initial encounter. EXAM: CT HEAD WITHOUT CONTRAST CT MAXILLOFACIAL WITHOUT CONTRAST CT CERVICAL SPINE WITHOUT CONTRAST TECHNIQUE: Multidetector CT imaging of the head, cervical spine, and maxillofacial structures were performed using the standard protocol without intravenous contrast. Multiplanar CT image reconstructions of the cervical spine and maxillofacial structures were also generated. COMPARISON:  Cervical spine MRI 06/11/2013 and cervical spine CT 11/01/2009. Head CT 05/16/2009. FINDINGS: CT HEAD FINDINGS Brain: There is no evidence of acute infarct, intracranial hemorrhage, midline shift, or extra-axial fluid collection. The ventricles and sulci are normal for age. There is thinning of the floor of the  sella on the right with the appearance of sellar soft tissue extending inferiorly through the floor into the upper clivus posterior to the right sphenoid sinus. This reflects a change from the 05/16/2009 head CT, and there is asymmetric right-sided pituitary enlargement on the 2015 cervical spine MRI. Vascular: Calcified atherosclerosis at the skull base. No hyperdense vessel. Skull: No skull fracture or focal osseous lesion. Other: Small lateral right frontal scalp hematoma. CT MAXILLOFACIAL FINDINGS Osseous: No fracture, suspicious osseous lesion, or mandibular dislocation. Orbits: Bilateral cataract extraction. No acute traumatic or inflammatory finding. Sinuses: Trace left sphenoid sinus mucosal thickening. No sinus fluid level. Minimal chronic right mastoid air cell opacification. Soft tissues: Right lateral frontal scalp hematoma. Mild soft tissue swelling in the right lower face and chin region. CT CERVICAL SPINE FINDINGS Alignment: Slight anterolisthesis of C7 on T1, likely degenerative. Skull base and vertebrae: No acute fracture. 4 mm densely sclerotic focus in the left C2 facet, new from 2011 though likely corresponding to a small focus of low signal on the 2015 MRI and indeterminate. No destructive osseous process. Soft tissues and spinal canal: No prevertebral fluid  or swelling. No visible canal hematoma. Disc levels: C4-C7 ACDF with solid osseous fusion. Progressive severe disc space narrowing at C7-T1 with suspected degenerative interbody ankylosis. Mild disc space narrowing at C3-4. Mild facet arthrosis at C7-T1. Upper chest: Clear lung apices. Other: None. IMPRESSION: 1. No evidence of acute intracranial abnormality. 2. Small right lateral scalp hematoma. Mild right-sided facial swelling. 3. No acute maxillofacial or cervical spine fracture identified. 4. 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 Referral for endocrine and/or  neurosurgical management is recommended. This follows ACR consensus guidelines: Management of Incidental Pituitary Findings on CT, MRI and F18-FDG PET: A White Paper of the ACR Incidental Findings Committee. J Am Coll Radiol 2018; 15: 992-42. Electronically Signed   By: Logan Bores M.D.   On: 04/02/2017 13:52    ____________________________________________   PROCEDURES  Procedure(s) performed:   Procedures  None ____________________________________________   INITIAL IMPRESSION / ASSESSMENT AND PLAN / ED COURSE  Pertinent labs & imaging results that were available during my care of the patient were reviewed by me and considered in my medical decision making (see chart for details).  Patient presents to the emergency department for evaluation after mechanical fall out of bed.  She lives alone but has family living nearby who are able to assist her.  She has not considered assisted living or rehab facility.  She was recently discharged after abdominal surgery but denies any significant abdominal pain.  Her exam is largely unremarkable.  She has multiple areas that are mildly painful to palpation.  She does have a hematoma to the right parietal scalp as well as bruising to the right mandible.  Plan for imaging of these painful areas and reassessment after some pain medication.   03:00 PM Imaging shows no acute fracture, dislocation, bleed, or acute abnormality.  I arrange for the patient to have additional home health, PT, social work at home.  She will call family for assistance today.  Patient had home health ordered from her recent hospitalization and was planning to be discharged from that today.  I have ordered additional home health and case manager has assisted me in providing this.  Plan for discharge with short course of pain medication.  The patient was able to get out of bed on her own power and use a walker to walk across the emergency department and go to the bathroom.  I believe  she is safe for discharge at this time.  At this time, I do not feel there is any life-threatening condition present. I have reviewed and discussed all results (EKG, imaging, lab, urine as appropriate), exam findings with patient. I have reviewed nursing notes and appropriate previous records.  I feel the patient is safe to be discharged home without further emergent workup. Discussed usual and customary return precautions. Patient and family (if present) verbalize understanding and are comfortable with this plan.  Patient will follow-up with their primary care provider. If they do not have a primary care provider, information for follow-up has been provided to them. All questions have been answered.  ____________________________________________  FINAL CLINICAL IMPRESSION(S) / ED DIAGNOSES  Final diagnoses:  Fall, initial encounter  Injury of head, initial encounter  Contusion of face, initial encounter  Right arm pain  Right leg pain     MEDICATIONS GIVEN DURING THIS VISIT:  Medications  oxyCODONE-acetaminophen (PERCOCET/ROXICET) 5-325 MG per tablet 1 tablet (1 tablet Oral Given 04/02/17 1443)     NEW OUTPATIENT MEDICATIONS STARTED  DURING THIS VISIT:  New Prescriptions   TRAMADOL (ULTRAM) 50 MG TABLET    Take 1 tablet (50 mg total) by mouth every 6 (six) hours as needed.    Note:  This document was prepared using Dragon voice recognition software and may include unintentional dictation errors.  Nanda Quinton, MD Emergency Medicine    David Rodriquez, [facility], MD 04/02/17 [phone]

## 2017-04-02 NOTE — Discharge Instructions (Signed)
You were seen in the Emergency Department (ED) today for a head injury.  Based on your evaluation, you may have sustained a concussion (or bruise) to your brain.  If you had a CT scan done, it did not show any evidence of serious injury or bleeding.   ° °Symptoms to expect from a concussion include nausea, mild to moderate headache, difficulty concentrating or sleeping, and mild lightheadedness.  These symptoms should improve over the next few days to weeks, but it may take many weeks before you feel back to normal.  Return to the emergency department or follow-up with your primary care doctor if your symptoms are not improving over this time. ° °Signs of a more serious head injury include vomiting, severe headache, excessive sleepiness or confusion, and weakness or numbness in your face, arms or legs.  Return immediately to the Emergency Department if you experience any of these more concerning symptoms.   ° °Rest, avoid strenuous physical or mental activity, and avoid activities that could potentially result in another head injury until all your symptoms from this head injury are completely resolved for at least 2-3 weeks.  You may take acetaminophen over the counter according to label instructions for mild headache or scalp soreness. ° °

## 2017-04-02 NOTE — ED Triage Notes (Signed)
Pt was "slidding" off bed to go to the bathroom when she fell onto R side. Has hematoma noted to R head and chin, c/o R knee and shoulder pain. Pt is ambulatory off EMS stretcher to ED stretcher. Denies use of blood thinner, alert and oriented at this time.

## 2017-04-02 NOTE — Care Management Note (Signed)
Case Management Note  Patient Details  Name: [name] [name] MRN: [id number] Date of Birth: [birth year]  Subjective/Objective:         Consulted in ED for [facility] needs. Pt from home, lives alone, niece works next door to her home and checks on her often. At night her niece lives two blocks from pt's home. Pt has RW and was referred to [facility] when discharged from another hospital on 03/18/17. Per pt. [facility] was comming to DC pt today.  EDP has ordered HH/face to face.          Action/Plan: DC home with [facility] services through [facility]. Pt interested in continuing services. Juliann Pulse, [facility] rep, aware of ED visit and will pull orders from chart.   Expected Discharge Date:    04/02/2017              Expected Discharge Plan:  [place]  In-House Referral:  NA  Discharge planning Services  CM Consult  Post Acute Care Choice:  Home Health Choice offered to:  Patient  Status of Service:  Completed, signed off  Sherald Barge, RN 04/02/2017, 2:53 PM

## 2017-04-08 ENCOUNTER — Telehealth (INDEPENDENT_AMBULATORY_CARE_PROVIDER_SITE_OTHER): Payer: Self-pay | Admitting: *Deleted

## 2017-04-08 NOTE — Telephone Encounter (Signed)
Talked with the patient . She has a doctor's appointment this Friday and says that the doctor may order a Hepatic on her. If so she will have them to  Fax the results to [place]. She will let us kn ow.  She also ask if Dr.Rehman would look at her biopsy and explain it to her. She says that it was October 01 2016. She ws in [place] in December then transferred to [facility] where she states that she had surgery. It was found that she had a Gall Stone in her bowel duct, she ws released from there on 02/15/17.

## 2017-04-08 NOTE — Telephone Encounter (Signed)
Patient called left message requesting to speak with Tammy regarding GI work. Please advise

## 2017-04-09 NOTE — Telephone Encounter (Signed)
Call returned. Liver biopsy results reviewed with patient.  It showed fibrosis stage I but no evidence of cirrhosis. She is seeing Dr. Nadara Mustard this Friday and will have LFTs repeated. She had lap assisted ERCP at [facility] in December 2018 for choledocholithiasis.

## 2017-04-09 NOTE — Telephone Encounter (Signed)
Forwarded to Dr.Rehman. 

## 2017-04-29 ENCOUNTER — Encounter (INDEPENDENT_AMBULATORY_CARE_PROVIDER_SITE_OTHER): Payer: Self-pay | Admitting: Internal Medicine

## 2017-04-29 ENCOUNTER — Ambulatory Visit (INDEPENDENT_AMBULATORY_CARE_PROVIDER_SITE_OTHER): Payer: Medicare Other | Admitting: Internal Medicine

## 2017-04-29 VITALS — BP 170/94 | HR 72 | Temp 98.0°F | Ht 63.0 in | Wt 220.8 lb

## 2017-04-29 DIAGNOSIS — K8050 Calculus of bile duct without cholangitis or cholecystitis without obstruction: Secondary | ICD-10-CM

## 2017-04-29 NOTE — Patient Instructions (Signed)
OV in 1 year.  

## 2017-04-29 NOTE — Progress Notes (Signed)
Subjective:    Patient ID: [name] [name], female    DOB: 11-14-1940, 77 y.o.   MRN: [id number]  HPI Here today for f/u. Las seen in June of 2018 for elevated liver enzymes.  Her liver numbers were coming down. Auto immune process ruled out.  [place] on 07/08/2016 revealed IMPRESSION: Diffusely increased hepatic echogenicity likely representing hepatic steatosis. No focal hepatic abnormalities or biliary dilatation noted.Common bile duct: Diameter: 7 mm. There is no evidence of intrahepatic or extrahepatic biliary dilatation.  Liver biopsy on 10/01/2016 revealed minimal periportal inflammation which is composed of lymphocytes with rare eosinophilics.  Focal bilie ductule proliferation without a ductule reaction. No granulomas seen.  Trichrome stains highlights minimal periportal fibrosis.  No loss of reticulin around hepatocytes. Iron storage is not seen on the iron stain.  PAS-D resistant nodules are not identified.   She presented to the ED 02/06/2017 with RUQ pain/epigastric pain, with N and V. Bilirubin 1.6, ALP 357, AST 645, ALT 242. Lipase was normal.  She underwent an MRI 02/06/2017 which revealed 1. Mildly dilated common bile duct with suspected choledocholithiasis, with a 6 mm apparent filling defect on the MRCP images. These images are severely degraded by motion artifact, as are other sequences, resulting in reduced diagnostic accuracy. Some of the biliary dilatation might be attributable to physiologic effects of prior cholecystectomy. She was transferred to [facility] [place] for possible CBD stone. Her altered upper GI anatomy from prior bariatric surgery would make ERCP potential challenging, if not impossible.  She was transferred to [facility] for biliary decompression/stone extraction.   She underwent and a laparoscopic assisted ERCP with lysis of adhesion and partial gastrectomy on 02/12/2017 by Dr. Florene Glen.  She tells me today she is doing pretty good. She is not having any abdominal pain. Recently had  lab work with Dr. Nadara Mustard. ALP slightly elevated. Her appetite is good. No weight loss.  She is going to have surgery for a hammer toe next Tuesday by Dr. Berline Lopes in [place].  She is not having any abdominal pain.   04/12/2017 total bili 0.3, ALP 166, AST 13, AL 11.    Review of Systems Past Medical History:  Diagnosis Date  . Anemia   . Anxiety   . Arthritis   . Barrett's esophagus    seen on 2016 egd at [facility]  . Depression   . GERD (gastroesophageal reflux disease)   . H/O hiatal hernia   . Hypertension   . Obesity   . Pneumonia   . PONV (postoperative nausea and vomiting)   . Seasonal allergies   . Sepsis ([place]) 10/02/2013    Past Surgical History:  Procedure Laterality Date  . ABDOMINAL HYSTERECTOMY    . ABDOMINAL SURGERY    . APPENDECTOMY    . BACK SURGERY     lumbar x2 by Dr Arnoldo Morale  . CARPAL TUNNEL RELEASE Right 03/2014   Dr. Lorin Mercy  . CATARACT EXTRACTION W/PHACO Left 08/06/2013   Procedure: CATARACT EXTRACTION PHACO AND INTRAOCULAR LENS PLACEMENT LEFT EYE;  Surgeon: Tonny Branch, MD;  Location: [facility];  Service: Ophthalmology;  Laterality: Left;  CDE: 9.55  . CATARACT EXTRACTION W/PHACO Right 08/24/2013   Procedure: CATARACT EXTRACTION PHACO AND INTRAOCULAR LENS PLACEMENT (IOC);  Surgeon: Tonny Branch, MD;  Location: [facility];  Service: Ophthalmology;  Laterality: Right;  CDE:8.30  . CERVICAL FUSION    . CHOLECYSTECTOMY    . COLONOSCOPY N/A 11/03/2013   SLF: 1. Normal mucosa in the terminal ileum. 2. 13 colon polyps removed.  3. Moderate diverticulosis in the descending colon and sigmoid colon 4. the left colon is redundant 5. Small internal  hemorrhoids.  . ESOPHAGOGASTRODUODENOSCOPY N/A 11/03/2013   SLF: 1. 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 Mild Non-erosive gastritis  . 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 s/p gastric bypass  . EYE SURGERY    . GASTRIC BYPASS  1979   revision in 1980   . HIATAL HERNIA REPAIR     [facility] in 2011  . JOINT REPLACEMENT Right 1995   hip  . JOINT REPLACEMENT Left 2008   hip  . MEDIAL PARTIAL KNEE REPLACEMENT Left    Dr Ronnie Derby  . PARAESOPHAGEAL HERNIA REPAIR  SEP 2010 DR. MCNATT  . SHOULDER ARTHROSCOPY Right   . SHOULDER ARTHROSCOPY Right    x2  . SKIN BIOPSY    . TONSILLECTOMY    . TOTAL SHOULDER ARTHROPLASTY Right 02/16/2013   Procedure: TOTAL SHOULDER ARTHROPLASTY;  Surgeon: Marybelle Killings, MD;  Location: [place];  Service: Orthopedics;  Laterality: Right;  Right Total Shoulder Arthroplasty, Cemented    Allergies  Allergen Reactions  . Novocain [Procaine Hcl]     Unknown-passed out with novocaine injected for dental surgery.  . Other Hives    EKG pads - need to use pediatric pads  . Latex Rash  . Penicillins Rash    Has patient had a PCN reaction causing immediate rash, facial/tongue/throat swelling, SOB or lightheadedness with hypotension: Yes Has patient had a PCN reaction causing severe rash involving mucus membranes or skin necrosis: No Has patient had a PCN reaction that required hospitalization No Has patient had a PCN reaction occurring within the last 10 years: No  If all of the above answers are "NO", then may proceed with Cephalosporin use.   . Sulfa Antibiotics Rash    Current Outpatient Medications on File Prior to Visit  Medication Sig Dispense Refill  . albuterol (PROVENTIL) (2.5 MG/3ML) 0.083% nebulizer solution Take 3 mLs (2.5 mg total) by nebulization every 4 (four) hours as needed for wheezing. (Patient taking differently: Take 2.5 mg by nebulization daily as needed for wheezing. ) 75 mL 12  . Biotin w/ Vitamins C & E (HAIR/SKIN/NAILS PO) Take 1 tablet by mouth daily.    . Calcium Citrate-Vitamin D (CITRACAL PETITES/VITAMIN D) 200-250 MG-UNIT TABS Take 1 tablet by mouth daily.    Marland Kitchen dextromethorphan-guaiFENesin  (MUCINEX DM) 30-600 MG 12hr tablet Take 1 tablet by mouth 2 (two) times daily as needed for cough.     . diazepam (VALIUM) 5 MG tablet Take 5 mg by mouth daily as needed for anxiety or muscle spasms. For anxiety    . docusate sodium (COLACE) 100 MG capsule Take 100 mg by mouth at bedtime.     . ferrous sulfate 325 (65 FE) MG tablet Take 325 mg by mouth daily with breakfast.    . Lactobacillus (FLORAJEN ACIDOPHILUS PO) Take 1 capsule by mouth every morning.     Marland Kitchen lisinopril-hydrochlorothiazide (PRINZIDE,ZESTORETIC) 20-12.5 MG tablet 1 tablet daily. Reported on 08/03/2015    . morphine 4 MG/ML injection Inject 1 mL (4 mg total) into the vein every 4 (four) hours as needed for moderate pain or severe pain. 10 mL 0  . omeprazole (PRILOSEC) 20 MG capsule Take 20 mg by mouth 2 (two) times daily before a meal.    .  ondansetron (ZOFRAN) 4 MG tablet Take 4 mg by mouth every 8 (eight) hours as needed for nausea or vomiting.    . potassium chloride (K-DUR) 10 MEQ tablet Take 10 mEq by mouth 3 (three) times daily with meals.     . sertraline (ZOLOFT) 100 MG tablet Take 100 mg by mouth daily.    . traMADol (ULTRAM) 50 MG tablet Take 1 tablet (50 mg total) by mouth every 6 (six) hours as needed. 15 tablet 0  . traZODone (DESYREL) 50 MG tablet Take 50 mg by mouth at bedtime.    . vitamin B-12 (CYANOCOBALAMIN) 100 MCG tablet Take 500 mcg by mouth daily.      No current facility-administered medications on file prior to visit.         Objective:   Physical Exam Blood pressure (!) 170/94, pulse 72, temperature 98 F (36.7 C), height 5\' 3"  (1.6 m), weight 220 lb 12.8 oz (100.2 kg).  Alert and oriented. Skin warm and dry. Oral mucosa is moist.   . Sclera anicteric, conjunctivae is pink. Thyroid not enlarged. No cervical lymphadenopathy. Lungs clear. Heart regular rate and rhythm.  Abdomen is soft. Bowel sounds are positive. No hepatomegaly. No abdominal masses felt. No tenderness.  No edema to lower extremities.           Assessment & Plan:  Common bile duct stone.Stone removal at [facility]. Labs today look good.  Will repeat Hepatic in 3 months.

## 2017-08-03 ENCOUNTER — Emergency Department (HOSPITAL_COMMUNITY): Payer: Medicare Other

## 2017-08-03 ENCOUNTER — Encounter (HOSPITAL_COMMUNITY): Payer: Self-pay | Admitting: Emergency Medicine

## 2017-08-03 ENCOUNTER — Emergency Department (HOSPITAL_COMMUNITY)
Admission: EM | Admit: 2017-08-03 | Discharge: 2017-08-03 | Disposition: A | Discharge status: Home / Self Care | Payer: Medicare Other | Attending: Emergency Medicine | Admitting: Emergency Medicine

## 2017-08-03 ENCOUNTER — Other Ambulatory Visit: Payer: Self-pay

## 2017-08-03 DIAGNOSIS — Z91040 Latex allergy status: Secondary | ICD-10-CM | POA: Insufficient documentation

## 2017-08-03 DIAGNOSIS — Y929 Unspecified place or not applicable: Secondary | ICD-10-CM | POA: Diagnosis not present

## 2017-08-03 DIAGNOSIS — X58XXXA Exposure to other specified factors, initial encounter: Secondary | ICD-10-CM | POA: Diagnosis not present

## 2017-08-03 DIAGNOSIS — Y939 Activity, unspecified: Secondary | ICD-10-CM | POA: Diagnosis not present

## 2017-08-03 DIAGNOSIS — Z96643 Presence of artificial hip joint, bilateral: Secondary | ICD-10-CM | POA: Insufficient documentation

## 2017-08-03 DIAGNOSIS — I10 Essential (primary) hypertension: Secondary | ICD-10-CM | POA: Insufficient documentation

## 2017-08-03 DIAGNOSIS — R1084 Generalized abdominal pain: Secondary | ICD-10-CM | POA: Diagnosis present

## 2017-08-03 DIAGNOSIS — S8392XA Sprain of unspecified site of left knee, initial encounter: Secondary | ICD-10-CM

## 2017-08-03 DIAGNOSIS — W19XXXA Unspecified fall, initial encounter: Secondary | ICD-10-CM | POA: Insufficient documentation

## 2017-08-03 DIAGNOSIS — Y999 Unspecified external cause status: Secondary | ICD-10-CM | POA: Insufficient documentation

## 2017-08-03 DIAGNOSIS — Z79899 Other long term (current) drug therapy: Secondary | ICD-10-CM | POA: Diagnosis not present

## 2017-08-03 LAB — COMPREHENSIVE METABOLIC PANEL
ALT: 12 U/L — ABNORMAL LOW (ref 14–54)
AST: 14 U/L — ABNORMAL LOW (ref 15–41)
Albumin: 3.9 g/dL (ref 3.5–5.0)
Alkaline Phosphatase: 130 U/L — ABNORMAL HIGH (ref 38–126)
Anion gap: 7 (ref 5–15)
BUN: 20 mg/dL (ref 6–20)
CO2: 26 mmol/L (ref 22–32)
Calcium: 8.9 mg/dL (ref 8.9–10.3)
Chloride: 107 mmol/L (ref 101–111)
Creatinine, Ser: 0.95 mg/dL (ref 0.44–1.00)
GFR calc Af Amer: >60 mL/min (ref >60)
GFR calc non Af Amer: 57 mL/min — ABNORMAL LOW (ref >60)
Glucose, Bld: 127 mg/dL — ABNORMAL HIGH (ref 65–99)
Potassium: 3.8 mmol/L (ref 3.5–5.1)
Sodium: 140 mmol/L (ref 135–145)
Total Bilirubin: 0.6 mg/dL (ref 0.3–1.2)
Total Protein: 6.5 g/dL (ref 6.5–8.1)

## 2017-08-03 LAB — CBC WITH DIFFERENTIAL/PLATELET
Basophils Absolute: 0 10*3/uL (ref 0.0–0.1)
Basophils Relative: 0 %
Eosinophils Absolute: 0.1 10*3/uL (ref 0.0–0.7)
Eosinophils Relative: 2 %
HCT: 39.3 % (ref 36.0–46.0)
Hemoglobin: 12.6 g/dL (ref 12.0–15.0)
Lymphocytes Relative: 21 %
Lymphs Abs: 1.1 10*3/uL (ref 0.7–4.0)
MCH: 29.4 pg (ref 26.0–34.0)
MCHC: 32.1 g/dL (ref 30.0–36.0)
MCV: 91.8 fL (ref 78.0–100.0)
Monocytes Absolute: 0.8 10*3/uL (ref 0.1–1.0)
Monocytes Relative: 15 %
Neutro Abs: 3.2 10*3/uL (ref 1.7–7.7)
Neutrophils Relative %: 62 %
Platelets: 138 10*3/uL — ABNORMAL LOW (ref 150–400)
RBC: 4.28 MIL/uL (ref 3.87–5.11)
RDW: 15.2 % (ref 11.5–15.5)
WBC: 5.2 10*3/uL (ref 4.0–10.5)

## 2017-08-03 LAB — URINALYSIS, ROUTINE W REFLEX MICROSCOPIC
Bacteria, UA: NONE SEEN
Bilirubin Urine: NEGATIVE
Glucose, UA: NEGATIVE mg/dL
Hgb urine dipstick: NEGATIVE
Ketones, ur: NEGATIVE mg/dL
Nitrite: NEGATIVE
Protein, ur: NEGATIVE mg/dL
Specific Gravity, Urine: 1.015 (ref 1.005–1.030)
pH: 6 (ref 5.0–8.0)

## 2017-08-03 LAB — LIPASE, BLOOD: Lipase: 20 U/L (ref 11–51)

## 2017-08-03 MED ORDER — TRAMADOL HCL 50 MG PO TABS: 50.0000 mg | ORAL_TABLET | Freq: Four times a day (QID) | ORAL | 0 refills | Status: DC | PRN

## 2017-08-03 MED ORDER — FENTANYL CITRATE (PF) 100 MCG/2ML IJ SOLN
50.0000 ug | Freq: Once | INTRAMUSCULAR | Status: AC
  Administered 2017-08-03: 50 ug via INTRAVENOUS
  Filled 2017-08-03: qty 2

## 2017-08-03 MED ORDER — ONDANSETRON HCL 4 MG/2ML IJ SOLN
4.0000 mg | Freq: Once | INTRAMUSCULAR | Status: AC
  Administered 2017-08-03: 4 mg via INTRAVENOUS
  Filled 2017-08-03: qty 2

## 2017-08-03 MED ORDER — IOPAMIDOL (ISOVUE-300) INJECTION 61%
100.0000 mL | Freq: Once | INTRAVENOUS | Status: AC | PRN
  Administered 2017-08-03: 100 mL via INTRAVENOUS

## 2017-08-03 NOTE — ED Notes (Signed)
Patient transported to CT 

## 2017-08-03 NOTE — Discharge Instructions (Addendum)
°  SEEK IMMEDIATE MEDICAL ATTENTION IF: The pain does not go away or becomes severe, particularly over the next 8-12 hours.  A temperature above 100.62F develops.  Repeated vomiting occurs (multiple episodes).  The pain becomes localized to portions of the abdomen.   Return also if you develop chest pain, difficulty breathing, dizziness or fainting, or become confused, poorly responsive, or inconsolable.

## 2017-08-03 NOTE — ED Triage Notes (Addendum)
Pt brought in by [facility]. Pt C/O abdominal pain that has been going on for the past 3 days. Pt has had gastric bypass surgery in the 70's. Pt also has had a recent surgery on her abdomen in 2018. Pt also C/O knee pain that started after a fall 3 weeks ago.

## 2017-08-03 NOTE — ED Notes (Signed)
Pt ambulated in hall without distress.

## 2017-08-03 NOTE — ED Notes (Signed)
EKG done and given to Dr Christy Gentles. Patient on cardiac monitor.

## 2017-08-03 NOTE — ED Provider Notes (Signed)
[facility] EMERGENCY DEPARTMENT Provider Note   CSN: [id number] Arrival date & time: 08/03/17  3474     History   Chief Complaint Chief Complaint  Patient presents with  . Abdominal Pain    HPI [name] [name] is a 77 y.o. female.  The history is provided by the patient.  Abdominal Pain   This is a new problem. The current episode started 2 days ago. The problem occurs daily. The problem has been gradually worsening. The pain is located in the generalized abdominal region. The pain is moderate. Associated symptoms include nausea, vomiting and arthralgias. Pertinent negatives include fever and diarrhea. The symptoms are aggravated by palpation. Nothing relieves the symptoms.  She with history of GERD, depression, multiple abdominal surgeries presents with abdominal pain for the past 2 days.  She also reports nausea vomiting.  No chest pain or shortness of breath. Also reports left knee pain after recent fall.  She is able to ambulate but it is painful in her knee.  Past Medical History:  Diagnosis Date  . Anemia   . Anxiety   . Arthritis   . Barrett's esophagus    seen on 2016 egd at [facility]  . Depression   . GERD (gastroesophageal reflux disease)   . H/O hiatal hernia   . Hypertension   . Obesity   . Pneumonia   . PONV (postoperative nausea and vomiting)   . Seasonal allergies   . Sepsis ([place]) 10/02/2013    Patient Active Problem List   Diagnosis Date Noted  . Choledocholithiasis   . Abnormal CT scan, esophagus   . Cholestasis 02/05/2017  . Depression 02/05/2017  . Hypomagnesemia 02/05/2017  . Bradycardia 02/05/2017  . Transaminitis 07/08/2016  . Constipation 07/08/2016  . GIB (gastrointestinal bleeding) 07/08/2016  . HCAP (healthcare-associated pneumonia) 09/23/2015  . AKI (acute kidney injury) ([place]) 07/21/2015  . Hypokalemia 07/21/2015  . CAP (community acquired pneumonia) 07/14/2015  . Upper abdominal pain 06/10/2014  . Folliculitis of perineum 02/24/2014    . Giant comedone 12/21/2013  . 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 Acrochordon 11/25/2013  . Dysphagia 10/29/2013  . Morbid obesity ([employer]) 10/02/2013  . Hypertension   . GERD (gastroesophageal reflux disease)   . Anxiety   . Anemia of chronic disease   . 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 ABDOMINAL HYSTERECTOMY    . ABDOMINAL SURGERY    . APPENDECTOMY    . BACK SURGERY     lumbar x2 by Dr Arnoldo Morale  . CARPAL TUNNEL RELEASE Right 03/2014   Dr. Lorin Mercy  . CATARACT EXTRACTION W/PHACO Left 08/06/2013   Procedure: CATARACT EXTRACTION PHACO AND INTRAOCULAR LENS PLACEMENT LEFT EYE;  Surgeon: Tonny Branch, MD;  Location: [facility];  Service: Ophthalmology;  Laterality: Left;  CDE: 9.55  . CATARACT EXTRACTION W/PHACO Right 08/24/2013   Procedure: CATARACT EXTRACTION PHACO AND INTRAOCULAR LENS PLACEMENT (IOC);  Surgeon: Tonny Branch, MD;  Location: [facility];  Service: Ophthalmology;  Laterality: Right;  CDE:8.30  . CERVICAL FUSION    . CHOLECYSTECTOMY    . COLONOSCOPY N/A 11/03/2013   SLF: 1. Normal mucosa in the terminal ileum. 2. 13 colon polyps removed. 3. Moderate diverticulosis in the descending colon and sigmoid colon 4. the left colon is redundant 5. Small internal  hemorrhoids.  . ESOPHAGOGASTRODUODENOSCOPY N/A 11/03/2013   SLF: 1. Dysphagia most likely due to sliding gastic pouch and non- adherence to gastric bypass diet  2. Mild Non-erosive gastritis  . 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 s/p gastric bypass  . EYE SURGERY    . GASTRIC BYPASS  1979   revision in 1980   . HIATAL HERNIA REPAIR     [facility] in 2011  . JOINT REPLACEMENT Right 1995   hip  . JOINT REPLACEMENT Left 2008   hip  . MEDIAL PARTIAL KNEE REPLACEMENT Left    Dr Ronnie Derby  . PARAESOPHAGEAL HERNIA REPAIR   SEP 2010 DR. MCNATT  . SHOULDER ARTHROSCOPY Right   . SHOULDER ARTHROSCOPY Right    x2  . SKIN BIOPSY    . TONSILLECTOMY    . TOTAL SHOULDER ARTHROPLASTY Right 02/16/2013   Procedure: TOTAL SHOULDER ARTHROPLASTY;  Surgeon: Marybelle Killings, MD;  Location: [place];  Service: Orthopedics;  Laterality: Right;  Right Total Shoulder Arthroplasty, Cemented     OB History   None      Home Medications    Prior to Admission medications   Medication Sig Start Date End Date Taking? Authorizing Provider  albuterol (PROVENTIL) (2.5 MG/3ML) 0.083% nebulizer solution Take 3 mLs (2.5 mg total) by nebulization every 4 (four) hours as needed for wheezing. Patient taking differently: Take 2.5 mg by nebulization daily as needed for wheezing.  07/21/15   Kathie Dike, MD  Biotin w/ Vitamins C & E (HAIR/SKIN/NAILS PO) Take 1 tablet by mouth daily.    [provider]  Calcium Citrate-Vitamin D (CITRACAL PETITES/VITAMIN D) 200-250 MG-UNIT TABS Take 1 tablet by mouth daily.    [provider]  dextromethorphan-guaiFENesin (MUCINEX DM) 30-600 MG 12hr tablet Take 1 tablet by mouth 2 (two) times daily as needed for cough.     [provider]  diazepam (VALIUM) 5 MG tablet Take 5 mg by mouth daily as needed for anxiety or muscle spasms. For anxiety    [provider]  docusate sodium (COLACE) 100 MG capsule Take 100 mg by mouth at bedtime.     [provider]  ferrous sulfate 325 (65 FE) MG tablet Take 325 mg by mouth daily with breakfast.    [provider]  Lactobacillus (FLORAJEN ACIDOPHILUS PO) Take 1 capsule by mouth every morning.     [provider]  lisinopril-hydrochlorothiazide (PRINZIDE,ZESTORETIC) 20-12.5 MG tablet 1 tablet daily. Reported on 08/03/2015 06/16/15   [provider]  morphine 4 MG/ML injection Inject 1 mL (4 mg total) into the vein every 4 (four) hours as needed for moderate pain or severe pain. 02/09/17   Dhungel, Flonnie Overman,  MD  omeprazole (PRILOSEC) 20 MG capsule Take 20 mg by mouth 2 (two) times daily before a meal.    [provider]  ondansetron (ZOFRAN) 4 MG tablet Take 4 mg by mouth every 8 (eight) hours as needed for nausea or vomiting.    [provider]  potassium chloride (K-DUR) 10 MEQ tablet Take 10 mEq by mouth 3 (three) times daily with meals.     [provider]  sertraline (ZOLOFT) 100 MG tablet Take 100 mg by mouth daily.    [provider]  traMADol (ULTRAM) 50 MG tablet Take 1 tablet (50 mg total) by mouth every 6 (six) hours as needed. 04/02/17   Long, Wonda Olds, MD  traZODone (DESYREL) 50 MG tablet Take 50 mg by mouth at bedtime.    [provider]  vitamin B-12 (CYANOCOBALAMIN) 100 MCG tablet Take 500  mcg by mouth daily.     [provider]    Family History Family History  Problem Relation Age of Onset  . Colon cancer Brother        IN HIS 60s-METASTATIC  . Colon polyps Paternal Grandfather   . Breast cancer Mother   . Hypertension Father   . Heart disease Father   . Hypertension Brother   . Heart disease Brother     Social History Social History   Tobacco Use  . Smoking status: Never Smoker  . Smokeless tobacco: Never Used  Substance Use Topics  . Alcohol use: No  . Drug use: No     Allergies   Novocain [procaine hcl]; Other; Latex; Penicillins; and Sulfa antibiotics   Review of Systems Review of Systems  Constitutional: Negative for fever.  Cardiovascular: Negative for chest pain.  Gastrointestinal: Positive for abdominal pain, nausea and vomiting. Negative for diarrhea.  Musculoskeletal: Positive for arthralgias.  All other systems reviewed and are negative.    Physical Exam Updated Vital Signs BP (!) 165/73 (BP Location: Right Arm)   Pulse 85   Temp 97.7 F (36.5 C) (Oral)   Resp 20   Wt 99.8 kg (220 lb)   SpO2 97%   BMI 38.97 kg/m   Physical Exam  CONSTITUTIONAL: Elderly, no acute distress HEAD:  Normocephalic/atraumatic EYES: EOMI/PERRL, no icterus ENMT: Mucous membranes moist NECK: supple no meningeal signs SPINE/BACK:entire spine nontender CV: S1/S2 noted, no murmurs/rubs/gallops noted LUNGS: Lungs are clear to auscultation bilaterally, no apparent distress ABDOMEN: soft, obese, diffuse moderate tenderness, no rebound or guarding, bowel sounds noted throughout abdomen GU:no cva tenderness NEURO: Pt is awake/alert/appropriate, moves all extremitiesx4.  No facial droop.   EXTREMITIES: pulses normal/equal, full ROM, mild tenderness left knee, no deformity, no bruising All other extremities/joints palpated/ranged and nontender SKIN: warm, color normal PSYCH: no abnormalities of mood noted, alert and oriented to situation  ED Treatments / Results  Labs (all labs ordered are listed, but only abnormal results are displayed) Labs Reviewed  URINALYSIS, ROUTINE W REFLEX MICROSCOPIC - Abnormal; Notable for the following components:      Result Value   Leukocytes, UA TRACE (*)    All other components within normal limits  COMPREHENSIVE METABOLIC PANEL - Abnormal; Notable for the following components:   Glucose, Bld 127 (*)    AST 14 (*)    ALT 12 (*)    Alkaline Phosphatase 130 (*)    GFR calc non Af Amer 57 (*)    All other components within normal limits  CBC WITH DIFFERENTIAL/PLATELET - Abnormal; Notable for the following components:   Platelets 138 (*)    All other components within normal limits  LIPASE, BLOOD    EKG EKG Interpretation  Date/Time:  Saturday August 03 2017 03:33:07 EDT Ventricular Rate:  68 PR Interval:    QRS Duration: 93 QT Interval:  405 QTC Calculation: 431 R Axis:   -16 Text Interpretation:  Sinus rhythm Consider left atrial enlargement Abnormal R-wave progression, early transition Left ventricular hypertrophy Anterior Q waves, possibly due to LVH Confirmed by Ripley Fraise [phone]) on 08/03/2017 3:48:48 AM   Radiology Ct Abdomen Pelvis W  Contrast  Result Date: 08/03/2017 CLINICAL DATA:  77 y/o  F; 3 days of abdominal pain. EXAM: CT ABDOMEN AND PELVIS WITH CONTRAST TECHNIQUE: Multidetector CT imaging of the abdomen and pelvis was performed using the standard protocol following bolus administration of intravenous contrast. CONTRAST:  166mL ISOVUE-300 IOPAMIDOL (ISOVUE-300) INJECTION 61% COMPARISON:  02/05/2017 CT abdomen and pelvis. 02/06/2017 MRI of the abdomen. FINDINGS: Lower chest: Small hiatal hernia. Hepatobiliary: No focal liver lesion. Cholecystectomy. No biliary ductal dilatation. Pancreas: Stable cyst within head of pancreas measuring 14 x 12 mm (series 2, image 33). No pancreatic ductal dilatation or surrounding inflammatory changes. Spleen: Normal in size without focal abnormality. Adrenals/Urinary Tract: Adrenal glands are unremarkable. Kidneys are normal, without renal calculi, focal lesion, or hydronephrosis. Distal ureters are obscured by streak artifact from hip hardware. Bladder is unremarkable. Stomach/Bowel: Gastric bypass postsurgical changes with patent anastomoses. No obstructive or inflammatory changes of the small and large bowel. Vascular/Lymphatic: Aortic atherosclerosis. No enlarged abdominal or pelvic lymph nodes. Reproductive: Status post hysterectomy. No adnexal masses. Other: Stable paraumbilical hernias containing fat. Musculoskeletal: No fracture is seen. Partially visualized hip prostheses. Multilevel degenerative changes of the spine and interbody fusion of lower thoracic vertebral bodies. IMPRESSION: 1. No acute process identified. 2. Stable cystic lesion within the head of pancreas measuring up to 14 mm. Two year follow-up is recommended with MRI/MRCP or pancreas protocol CT. This recommendation follows ACR consensus guidelines: Management of Incidental Pancreatic Cysts: A White Paper of the ACR Incidental Findings Committee. J Am Coll Radiol 7673;41:937-902. 3. Small hiatal hernia. 4. Stable small paraumbilical  cysts containing fat. 5. Aortic atherosclerosis. Electronically Signed   By: Kristine Garbe M.D.   On: 08/03/2017 06:49   Dg Knee Complete 4 Views Left  Result Date: 08/03/2017 CLINICAL DATA:  Subacute onset of left knee pain after fall. Initial encounter. EXAM: LEFT KNEE - COMPLETE 4+ VIEW COMPARISON:  None. FINDINGS: There is no evidence of fracture or dislocation. The patient's total knee arthroplasty is grossly unremarkable in appearance, without evidence of loosening. The patellofemoral component is grossly unremarkable. Trace knee joint fluid remains within normal limits. Scattered clips are noted about the knee. IMPRESSION: No evidence of fracture or dislocation. Total knee arthroplasty is grossly unremarkable in appearance, without evidence of loosening. Electronically Signed   By: Garald Balding M.D.   On: 08/03/2017 05:09    Procedures Procedures (including critical care time)  Medications Ordered in ED Medications  fentaNYL (SUBLIMAZE) injection 50 mcg (50 mcg Intravenous Given 08/03/17 0422)  ondansetron (ZOFRAN) injection 4 mg (4 mg Intravenous Given 08/03/17 0422)  fentaNYL (SUBLIMAZE) injection 50 mcg (50 mcg Intravenous Given 08/03/17 0543)  iopamidol (ISOVUE-300) 61 % injection 100 mL (100 mLs Intravenous Contrast Given 08/03/17 0610)     Initial Impression / Assessment and Plan / ED Course  I have reviewed the triage vital signs and the nursing notes.  Pertinent labs & imaging results that were available during my care of the patient were reviewed by me and considered in my medical decision making (see chart for details).   Narcotic database reviewed and considered in decision making 4:18 AM Patient with distant history of gastric bypass, history of cholecystectomy, history of recent retained CBD stone that required laparoscopic assisted ERCP (2018) presents with a diffuse abdominal pain.  Labs pending at this time.  We will also obtain left knee x-ray after recent fall.   No signs of trauma 5:34 AM Knee x-ray negative. Patient continues to have abdominal pain, now in the left lower quadrant.  Will obtain CT abdomen 7:32 AM CT negative for acute process.  Patient is taking p.o.  Patient is ambulatory.  I feel she is appropriate discharge home No signs of acute fracture around the knee.  No signs of acute abdominal process.  She is supposed to follow-up in  the next 1 to 2 years for repeat MRI of her pancreas, patient informed of this and will follow up with GI Final Clinical Impressions(s) / ED Diagnoses   Final diagnoses:  Generalized abdominal pain  Sprain of left knee, unspecified ligament, initial encounter    ED Discharge Orders        Ordered    traMADol (ULTRAM) 50 MG tablet  Every 6 hours PRN     08/03/17 0731       Ripley Fraise, MD 08/03/17 [phone]

## 2017-08-05 ENCOUNTER — Emergency Department (HOSPITAL_COMMUNITY)
Admission: EM | Admit: 2017-08-05 | Discharge: 2017-08-05 | Disposition: A | Discharge status: Home / Self Care | Payer: Medicare Other | Attending: Emergency Medicine | Admitting: Emergency Medicine

## 2017-08-05 ENCOUNTER — Emergency Department (HOSPITAL_COMMUNITY): Payer: Medicare Other

## 2017-08-05 ENCOUNTER — Encounter (HOSPITAL_COMMUNITY): Payer: Self-pay | Admitting: Emergency Medicine

## 2017-08-05 DIAGNOSIS — I10 Essential (primary) hypertension: Secondary | ICD-10-CM | POA: Diagnosis not present

## 2017-08-05 DIAGNOSIS — R1032 Left lower quadrant pain: Secondary | ICD-10-CM | POA: Diagnosis present

## 2017-08-05 DIAGNOSIS — R1030 Lower abdominal pain, unspecified: Secondary | ICD-10-CM

## 2017-08-05 DIAGNOSIS — Z96641 Presence of right artificial hip joint: Secondary | ICD-10-CM | POA: Diagnosis not present

## 2017-08-05 DIAGNOSIS — Z96611 Presence of right artificial shoulder joint: Secondary | ICD-10-CM | POA: Diagnosis not present

## 2017-08-05 DIAGNOSIS — Z79899 Other long term (current) drug therapy: Secondary | ICD-10-CM | POA: Diagnosis not present

## 2017-08-05 DIAGNOSIS — Z91040 Latex allergy status: Secondary | ICD-10-CM | POA: Insufficient documentation

## 2017-08-05 DIAGNOSIS — Z96652 Presence of left artificial knee joint: Secondary | ICD-10-CM | POA: Diagnosis not present

## 2017-08-05 DIAGNOSIS — Z96642 Presence of left artificial hip joint: Secondary | ICD-10-CM | POA: Insufficient documentation

## 2017-08-05 LAB — CBC WITH DIFFERENTIAL/PLATELET
Basophils Absolute: 0 10*3/uL (ref 0.0–0.1)
Basophils Relative: 0 %
Eosinophils Absolute: 0.2 10*3/uL (ref 0.0–0.7)
Eosinophils Relative: 3 %
HCT: 40.9 % (ref 36.0–46.0)
Hemoglobin: 13 g/dL (ref 12.0–15.0)
Lymphocytes Relative: 19 %
Lymphs Abs: 1 10*3/uL (ref 0.7–4.0)
MCH: 29.5 pg (ref 26.0–34.0)
MCHC: 31.8 g/dL (ref 30.0–36.0)
MCV: 93 fL (ref 78.0–100.0)
Monocytes Absolute: 0.7 10*3/uL (ref 0.1–1.0)
Monocytes Relative: 13 %
Neutro Abs: 3.5 10*3/uL (ref 1.7–7.7)
Neutrophils Relative %: 65 %
Platelets: 143 10*3/uL — ABNORMAL LOW (ref 150–400)
RBC: 4.4 MIL/uL (ref 3.87–5.11)
RDW: 14.9 % (ref 11.5–15.5)
WBC: 5.4 10*3/uL (ref 4.0–10.5)

## 2017-08-05 LAB — URINALYSIS, ROUTINE W REFLEX MICROSCOPIC
Bacteria, UA: NONE SEEN
Bilirubin Urine: NEGATIVE
Glucose, UA: NEGATIVE mg/dL
Hgb urine dipstick: NEGATIVE
Ketones, ur: NEGATIVE mg/dL
Nitrite: NEGATIVE
Protein, ur: NEGATIVE mg/dL
Specific Gravity, Urine: 1.021 (ref 1.005–1.030)
pH: 5 (ref 5.0–8.0)

## 2017-08-05 LAB — COMPREHENSIVE METABOLIC PANEL
ALT: 13 U/L — ABNORMAL LOW (ref 14–54)
AST: 14 U/L — ABNORMAL LOW (ref 15–41)
Albumin: 3.9 g/dL (ref 3.5–5.0)
Alkaline Phosphatase: 147 U/L — ABNORMAL HIGH (ref 38–126)
Anion gap: 8 (ref 5–15)
BUN: 23 mg/dL — ABNORMAL HIGH (ref 6–20)
CO2: 27 mmol/L (ref 22–32)
Calcium: 8.9 mg/dL (ref 8.9–10.3)
Chloride: 103 mmol/L (ref 101–111)
Creatinine, Ser: 1.01 mg/dL — ABNORMAL HIGH (ref 0.44–1.00)
GFR calc Af Amer: >60 mL/min (ref >60)
GFR calc non Af Amer: 53 mL/min — ABNORMAL LOW (ref >60)
Glucose, Bld: 114 mg/dL — ABNORMAL HIGH (ref 65–99)
Potassium: 3.8 mmol/L (ref 3.5–5.1)
Sodium: 138 mmol/L (ref 135–145)
Total Bilirubin: 0.6 mg/dL (ref 0.3–1.2)
Total Protein: 6.8 g/dL (ref 6.5–8.1)

## 2017-08-05 LAB — LIPASE, BLOOD: Lipase: 20 U/L (ref 11–51)

## 2017-08-05 MED ORDER — FENTANYL CITRATE (PF) 100 MCG/2ML IJ SOLN
50.0000 ug | Freq: Once | INTRAMUSCULAR | Status: AC
  Administered 2017-08-05: 50 ug via INTRAVENOUS
  Filled 2017-08-05: qty 2

## 2017-08-05 MED ORDER — HYDROCODONE-ACETAMINOPHEN 5-325 MG PO TABS: 1.0000 | ORAL_TABLET | Freq: Four times a day (QID) | ORAL | 0 refills | Status: DC | PRN

## 2017-08-05 NOTE — ED Triage Notes (Signed)
Pt reports ongoing lower abdominal pain with sensation of pressure and pushing out.  Pt states she cannot tell if it is rectal or vaginal pressure.

## 2017-08-05 NOTE — Discharge Instructions (Signed)
Get a bottle of magnesium citrate from the pharmacy and drink one half the bottle.  If you do not have a bowel movement then drink the other half.  Follow-up with Dr. Laural Golden tomorrow at 11:30 am

## 2017-08-05 NOTE — ED Provider Notes (Signed)
[facility] EMERGENCY DEPARTMENT Provider Note   CSN: [id number] Arrival date & time: 08/05/17  1114     History   Chief Complaint Chief Complaint  Patient presents with  . Abdominal Pain    HPI [name] [name] is a 77 y.o. female.  Patient complains of left lower quadrant abdominal pain she was evaluated in the emergency department this weekend and had a CT scan of her abdomen and it did not show any acute problems.  She continues to have pain that Ultram does not seem to help  The history is provided by the patient. No language interpreter was used.  Abdominal Pain   This is a new problem. The problem occurs constantly. The problem has not changed since onset.The pain is associated with an unknown factor. The pain is located in the LLQ. The quality of the pain is aching. The pain is at a severity of 6/10. The pain is moderate. Pertinent negatives include anorexia, diarrhea, frequency, hematuria and headaches.    Past Medical History:  Diagnosis Date  . Anemia   . Anxiety   . Arthritis   . Barrett's esophagus    seen on 2016 egd at [facility]  . Depression   . GERD (gastroesophageal reflux disease)   . H/O hiatal hernia   . Hypertension   . Obesity   . Pneumonia   . PONV (postoperative nausea and vomiting)   . Seasonal allergies   . Sepsis ([place]) 10/02/2013    Patient Active Problem List   Diagnosis Date Noted  . Choledocholithiasis   . Abnormal CT scan, esophagus   . Cholestasis 02/05/2017  . Depression 02/05/2017  . Hypomagnesemia 02/05/2017  . Bradycardia 02/05/2017  . Transaminitis 07/08/2016  . Constipation 07/08/2016  . GIB (gastrointestinal bleeding) 07/08/2016  . HCAP (healthcare-associated pneumonia) 09/23/2015  . AKI (acute kidney injury) ([place]) 07/21/2015  . Hypokalemia 07/21/2015  . CAP (community acquired pneumonia) 07/14/2015  . Upper abdominal pain 06/10/2014  . Folliculitis of perineum 02/24/2014  . Giant comedone 12/21/2013  . 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 Acrochordon 11/25/2013  . Dysphagia 10/29/2013  . Morbid obesity ([place]) 10/02/2013  . Hypertension   . GERD (gastroesophageal reflux disease)   . Anxiety   . Anemia of chronic disease   . 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 ABDOMINAL HYSTERECTOMY    . ABDOMINAL SURGERY    . APPENDECTOMY    . BACK SURGERY     lumbar x2 by Dr Arnoldo Morale  . CARPAL TUNNEL RELEASE Right 03/2014   Dr. Lorin Mercy  . CATARACT EXTRACTION W/PHACO Left 08/06/2013   Procedure: CATARACT EXTRACTION PHACO AND INTRAOCULAR LENS PLACEMENT LEFT EYE;  Surgeon: Tonny Branch, MD;  Location: [facility];  Service: Ophthalmology;  Laterality: Left;  CDE: 9.55  . CATARACT EXTRACTION W/PHACO Right 08/24/2013   Procedure: CATARACT EXTRACTION PHACO AND INTRAOCULAR LENS PLACEMENT (IOC);  Surgeon: Tonny Branch, MD;  Location: [facility];  Service: Ophthalmology;  Laterality: Right;  CDE:8.30  . CERVICAL FUSION    . CHOLECYSTECTOMY    . COLONOSCOPY N/A 11/03/2013   SLF: 1. Normal mucosa in the terminal ileum. 2. 13 colon polyps removed. 3. Moderate diverticulosis in the descending colon and sigmoid colon 4. the left colon is redundant 5. Small internal  hemorrhoids.  . ESOPHAGOGASTRODUODENOSCOPY N/A 11/03/2013   SLF: 1. 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  Mild Non-erosive gastritis  . 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 s/p gastric bypass  . EYE SURGERY    . GASTRIC BYPASS  1979   revision in 1980   . HIATAL HERNIA REPAIR     [facility] in 2011  . JOINT REPLACEMENT Right 1995   hip  . JOINT REPLACEMENT Left 2008   hip  . MEDIAL PARTIAL KNEE REPLACEMENT Left    Dr Ronnie Derby  . PARAESOPHAGEAL HERNIA REPAIR  SEP 2010 DR. MCNATT  . SHOULDER ARTHROSCOPY Right     . SHOULDER ARTHROSCOPY Right    x2  . SKIN BIOPSY    . TONSILLECTOMY    . TOTAL SHOULDER ARTHROPLASTY Right 02/16/2013   Procedure: TOTAL SHOULDER ARTHROPLASTY;  Surgeon: Marybelle Killings, MD;  Location: [facility];  Service: Orthopedics;  Laterality: Right;  Right Total Shoulder Arthroplasty, Cemented     OB History   None      Home Medications    Prior to Admission medications   Medication Sig Start Date End Date Taking? Authorizing Provider  albuterol (PROVENTIL) (2.5 MG/3ML) 0.083% nebulizer solution Take 3 mLs (2.5 mg total) by nebulization every 4 (four) hours as needed for wheezing. Patient taking differently: Take 2.5 mg by nebulization daily as needed for wheezing.  07/21/15  Yes Kathie Dike, MD  Biotin w/ Vitamins C & E (HAIR/SKIN/NAILS PO) Take 1 tablet by mouth daily.   Yes [provider]  Calcium Citrate-Vitamin D (CITRACAL PETITES/VITAMIN D) 200-250 MG-UNIT TABS Take 1 tablet by mouth daily.   Yes [provider]  dextromethorphan-guaiFENesin (MUCINEX DM) 30-600 MG 12hr tablet Take 1 tablet by mouth 2 (two) times daily as needed for cough.    Yes [provider]  diazepam (VALIUM) 5 MG tablet Take 5 mg by mouth daily as needed for anxiety or muscle spasms. For anxiety   Yes [provider]  docusate sodium (COLACE) 100 MG capsule Take 100 mg by mouth at bedtime.    Yes [provider]  ferrous sulfate 325 (65 FE) MG tablet Take 325 mg by mouth daily with breakfast.   Yes [provider]  Lactobacillus (FLORAJEN ACIDOPHILUS PO) Take 1 capsule by mouth every morning.    Yes [provider]  lisinopril-hydrochlorothiazide (PRINZIDE,ZESTORETIC) 20-12.5 MG tablet 1 tablet daily. Reported on 08/03/2015 06/16/15  Yes [provider]  omeprazole (PRILOSEC) 20 MG capsule Take 20 mg by mouth 2 (two) times daily before a meal.   Yes [provider]  ondansetron (ZOFRAN) 4 MG tablet Take 4 mg by mouth every 8  (eight) hours as needed for nausea or vomiting.   Yes [provider]  potassium chloride (K-DUR) 10 MEQ tablet Take 10 mEq by mouth 3 (three) times daily with meals.    Yes [provider]  sertraline (ZOLOFT) 100 MG tablet Take 100 mg by mouth daily.   Yes [provider]  traMADol (ULTRAM) 50 MG tablet Take 1 tablet (50 mg total) by mouth every 6 (six) hours as needed for severe pain. 08/03/17  Yes Ripley Fraise, MD  traZODone (DESYREL) 50 MG tablet Take 50 mg by mouth at bedtime.   Yes [provider]  vitamin B-12 (CYANOCOBALAMIN) 100 MCG tablet Take 500 mcg by mouth daily.    Yes [provider]  HYDROcodone-acetaminophen (NORCO/VICODIN) 5-325 MG tablet Take 1 tablet by mouth every 6 (six) hours as needed for moderate  pain. 08/05/17   Milton Ferguson, MD    Family History Family History  Problem Relation Age of Onset  . Colon cancer Brother        IN HIS 60s-METASTATIC  . Colon polyps Paternal Grandfather   . Breast cancer Mother   . Hypertension Father   . Heart disease Father   . Hypertension Brother   . Heart disease Brother     Social History Social History   Tobacco Use  . Smoking status: Never Smoker  . Smokeless tobacco: Never Used  Substance Use Topics  . Alcohol use: No  . Drug use: No     Allergies   Novocain [procaine hcl]; Other; Latex; Penicillins; and Sulfa antibiotics   Review of Systems Review of Systems  Constitutional: Negative for appetite change and fatigue.  HENT: Negative for congestion, ear discharge and sinus pressure.   Eyes: Negative for discharge.  Respiratory: Negative for cough.   Cardiovascular: Negative for chest pain.  Gastrointestinal: Positive for abdominal pain. Negative for anorexia and diarrhea.  Genitourinary: Negative for frequency and hematuria.  Musculoskeletal: Negative for back pain.  Skin: Negative for rash.  Neurological: Negative for seizures and headaches.    Psychiatric/Behavioral: Negative for hallucinations.     Physical Exam Updated Vital Signs BP (!) 158/59 (BP Location: Left Arm)   Pulse 64   Temp 98.1 F (36.7 C) (Tympanic)   Resp 20   Ht 5\' 3"  (1.6 m)   Wt 103.4 kg (228 lb)   SpO2 98%   BMI 40.39 kg/m   Physical Exam  Constitutional: She is oriented to person, place, and time. She appears well-developed.  HENT:  Head: Normocephalic.  Eyes: Conjunctivae and EOM are normal. No scleral icterus.  Neck: Neck supple. No thyromegaly present.  Cardiovascular: Normal rate and regular rhythm. Exam reveals no gallop and no friction rub.  No murmur heard. Pulmonary/Chest: No stridor. She has no wheezes. She has no rales. She exhibits no tenderness.  Abdominal: She exhibits no distension. There is tenderness. There is no rebound.  Musculoskeletal: Normal range of motion. She exhibits no edema.  Lymphadenopathy:    She has no cervical adenopathy.  Neurological: She is oriented to person, place, and time. She exhibits normal muscle tone. Coordination normal.  Skin: No rash noted. No erythema.  Psychiatric: She has a normal mood and affect. Her behavior is normal.     ED Treatments / Results  Labs (all labs ordered are listed, but only abnormal results are displayed) Labs Reviewed  URINALYSIS, ROUTINE W REFLEX MICROSCOPIC - Abnormal; Notable for the following components:      Result Value   APPearance HAZY (*)    Leukocytes, UA TRACE (*)    All other components within normal limits  CBC WITH DIFFERENTIAL/PLATELET - Abnormal; Notable for the following components:   Platelets 143 (*)    All other components within normal limits  COMPREHENSIVE METABOLIC PANEL - Abnormal; Notable for the following components:   Glucose, Bld 114 (*)    BUN 23 (*)    Creatinine, Ser 1.01 (*)    AST 14 (*)    ALT 13 (*)    Alkaline Phosphatase 147 (*)    GFR calc non Af Amer 53 (*)    All other components within normal limits  LIPASE, BLOOD     EKG None  Radiology Dg Abd Acute W/chest  Result Date: 08/05/2017 CLINICAL DATA:  Abdominal pain EXAM: DG ABDOMEN ACUTE W/ 1V CHEST COMPARISON:  CT abdomen  and pelvis August 03, 2017; chest radiograph April 02, 2017 FINDINGS: PA chest: No edema or consolidation. Heart size and pulmonary vascularity are normal. No adenopathy. There is aortic atherosclerosis. There is postoperative change in the right shoulder and lower cervical spine. Supine and upright abdomen: There are surgical clips in the upper abdomen and pelvic regions. There is moderate stool throughout the colon. There is no bowel dilatation or air-fluid level to suggest bowel obstruction. No free air. There are total hip replacements bilaterally. IMPRESSION: Moderate stool throughout colon. No bowel obstruction or free air. Extensive postoperative change noted. No lung edema or consolidation.  There is aortic atherosclerosis. Aortic Atherosclerosis (ICD10-I70.0). Electronically Signed   By: Lowella Grip III M.D.   On: 08/05/2017 14:25    Procedures Procedures (including critical care time)  Medications Ordered in ED Medications  fentaNYL (SUBLIMAZE) injection 50 mcg (50 mcg Intravenous Given 08/05/17 1533)     Initial Impression / Assessment and Plan / ED Course  I have reviewed the triage vital signs and the nursing notes.  Pertinent labs & imaging results that were available during my care of the patient were reviewed by me and considered in my medical decision making (see chart for details).     Patient with abdominal pain.  CBC chemistries unremarkable plain films show some constipation.  Patient is told to use magnesium citrate and she will follow-up with her GI doctor tomorrow at 1130  Final Clinical Impressions(s) / ED Diagnoses   Final diagnoses:  Lower abdominal pain    ED Discharge Orders        Ordered    HYDROcodone-acetaminophen (NORCO/VICODIN) 5-325 MG tablet  Every 6 hours PRN     08/05/17 1539        Milton Ferguson, MD 08/05/17 1544

## 2017-08-06 ENCOUNTER — Encounter (INDEPENDENT_AMBULATORY_CARE_PROVIDER_SITE_OTHER): Payer: Self-pay | Admitting: Internal Medicine

## 2017-08-06 ENCOUNTER — Ambulatory Visit (INDEPENDENT_AMBULATORY_CARE_PROVIDER_SITE_OTHER): Payer: Medicare Other | Admitting: Internal Medicine

## 2017-08-06 ENCOUNTER — Telehealth (INDEPENDENT_AMBULATORY_CARE_PROVIDER_SITE_OTHER): Payer: Self-pay | Admitting: *Deleted

## 2017-08-06 ENCOUNTER — Encounter (INDEPENDENT_AMBULATORY_CARE_PROVIDER_SITE_OTHER): Payer: Self-pay | Admitting: *Deleted

## 2017-08-06 VITALS — BP 180/90 | HR 60 | Temp 98.5°F | Ht 63.0 in | Wt 213.6 lb

## 2017-08-06 DIAGNOSIS — Z860100 Personal history of colon polyps, unspecified: Secondary | ICD-10-CM

## 2017-08-06 MED ORDER — PEG 3350-KCL-NA BICARB-NACL 420 G PO SOLR: 4000.0000 mL | Freq: Once | ORAL | 0 refills | Status: AC

## 2017-08-06 NOTE — Telephone Encounter (Signed)
Patient needs trilyte 

## 2017-08-06 NOTE — Progress Notes (Signed)
Subjective:    Patient ID: [name] [name], female    DOB: 04/28/1940, 77 y.o.   MRN: [id number]  HPI Here today for f/u after recent visit to the ED yesterday and the day before for lower abdominal pain.  CT scan on 08/03/2017 revealed no acute process. 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 DG Abdomen on 08/04/2017 revealed  Moderate stool throughout colon. No bowel obstruction or free air. Extensive postoperative change noted. She was advised to follow up with our office.  She tells me she is having problems with constipation. She takes stools softeners and laxatives. Sometimes she will go a week without having a BM  She did have a BM today. Her last BM before this was about a week ago. She takes Hydrocodone as needed for back pain. She does not like Miralax. She has had this problem with constipation for a long time. Her appetite is good. She has lost about 7 pounds since February.  No abdominal pain today. Her last colonoscopy was in 2015 with multiple polyps. She is overdue for a colonoscopy.   Lipase normal. . CMET normal.  CBC normal.  CMP Latest Ref Rng & Units 08/05/2017 08/03/2017 02/09/2017  Glucose 65 - 99 mg/dL 114(H) 127(H) 97  BUN 6 - 20 mg/dL 23(H) 20 6  Creatinine 0.44 - 1.00 mg/dL 1.01(H) 0.95 1.04(H)  Sodium 135 - 145 mmol/L 138 140 140  Potassium 3.5 - 5.1 mmol/L 3.8 3.8 3.6  Chloride 101 - 111 mmol/L 103 107 106  CO2 22 - 32 mmol/L 27 26 27   Calcium 8.9 - 10.3 mg/dL 8.9 8.9 8.5(L)  Total Protein 6.5 - 8.1 g/dL 6.8 6.5 6.0(L)  Total Bilirubin 0.3 - 1.2 mg/dL 0.6 0.6 0.7  Alkaline Phos 38 - 126 U/L 147(H) 130(H) 276(H)  AST 15 - 41 U/L 14(L) 14(L) 33  ALT 14 - 54 U/L 13(L) 12(L) 110(H)    She presented to the ED 02/06/2017 with RUQ pain/epigastric pain, with N and V. Bilirubin 1.6, ALP 357, AST 645, ALT 242. Lipase was normal.  She underwent an MRI 02/06/2017 which revealed 1. Mildly dilated common bile duct with suspected choledocholithiasis,  with a 6 mm apparent filling defect on the MRCP images. These images are severely degraded by motion artifact, as are other sequences, resulting in reduced diagnostic accuracy. Some of the biliary dilatation might be attributable to physiologic effects of prior cholecystectomy. She was transferred to [facility] for possible CBD stone. Her altered upper GI anatomy from prior bariatric surgery would make ERCP potential challenging, if not impossible.  She was transferred to [facility] for biliary decompression/stone extraction.    She underwent and a laparoscopic assisted ERCP with lysis of adhesion and partial gastrectomy on 02/12/2017 by Dr. Florene Glen.     Her last colonoscopy was in 2015 which revealed normal mucosa in terminal ileum. 13 colon polyps removed. Moderate diverticulosis in descending and sigmoid colon. Small internal hemorrhoids.  Biopsy: tubular adenoma, Sessile serrated polyp/adenoma with no cytological dysplasia. No evidence of malignancy. Tubular adenoma, Hyperplastic polyps.    Review of Systems Past Medical History:  Diagnosis Date  . Anemia   . Anxiety   . Arthritis   . Barrett's esophagus    seen on [facility] egd at [facility]  . Depression   . GERD (gastroesophageal reflux disease)   . H/O hiatal hernia   . Hypertension   . Obesity   . Pneumonia   . PONV (postoperative nausea and  vomiting)   . Seasonal allergies   . Sepsis ([place]) 10/02/2013    Past Surgical History:  Procedure Laterality Date  . ABDOMINAL HYSTERECTOMY    . ABDOMINAL SURGERY    . APPENDECTOMY    . BACK SURGERY     lumbar x2 by Dr Arnoldo Morale  . CARPAL TUNNEL RELEASE Right 03/2014   Dr. Lorin Mercy  . CATARACT EXTRACTION W/PHACO Left 08/06/2013   Procedure: CATARACT EXTRACTION PHACO AND INTRAOCULAR LENS PLACEMENT LEFT EYE;  Surgeon: Tonny Branch, MD;  Location: [facility];  Service: Ophthalmology;  Laterality: Left;  CDE: 9.55  . CATARACT EXTRACTION W/PHACO Right 08/24/2013   Procedure: CATARACT EXTRACTION PHACO AND INTRAOCULAR  LENS PLACEMENT (IOC);  Surgeon: Tonny Branch, MD;  Location: [facility];  Service: Ophthalmology;  Laterality: Right;  CDE:8.30  . CERVICAL FUSION    . CHOLECYSTECTOMY    . COLONOSCOPY N/A 11/03/2013   SLF: 1. Normal mucosa in the terminal ileum. 2. 13 colon polyps removed. 3. Moderate diverticulosis in the descending colon and sigmoid colon 4. the left colon is redundant 5. Small internal  hemorrhoids.  . ESOPHAGOGASTRODUODENOSCOPY N/A 11/03/2013   SLF: 1. 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 Mild Non-erosive gastritis  . 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 s/p gastric bypass  . EYE SURGERY    . GASTRIC BYPASS  1979   revision in 1980   . HIATAL HERNIA REPAIR     [facility] in 2011  . JOINT REPLACEMENT Right 1995   hip  . JOINT REPLACEMENT Left 2008   hip  . MEDIAL PARTIAL KNEE REPLACEMENT Left    Dr Ronnie Derby  . PARAESOPHAGEAL HERNIA REPAIR  SEP 2010 DR. MCNATT  . SHOULDER ARTHROSCOPY Right   . SHOULDER ARTHROSCOPY Right    x2  . SKIN BIOPSY    . TONSILLECTOMY    . TOTAL SHOULDER ARTHROPLASTY Right 02/16/2013   Procedure: TOTAL SHOULDER ARTHROPLASTY;  Surgeon: Marybelle Killings, MD;  Location: [facility];  Service: Orthopedics;  Laterality: Right;  Right Total Shoulder Arthroplasty, Cemented    Allergies  Allergen Reactions  . Novocain [Procaine Hcl]     Unknown-passed out with novocaine injected for dental surgery.  . Other Hives    EKG pads - need to use pediatric pads  . Latex Rash  . Penicillins Rash    Has patient had a PCN reaction causing immediate rash, facial/tongue/throat swelling, SOB or lightheadedness with hypotension: Yes Has patient had a PCN reaction causing severe rash involving mucus membranes or skin necrosis: No Has patient had a PCN reaction that required hospitalization No Has patient  had a PCN reaction occurring within the last 10 years: No  If all of the above answers are "NO", then may proceed with Cephalosporin use.   . Sulfa Antibiotics Rash    Current Outpatient Medications on File Prior to Visit  Medication Sig Dispense Refill  . albuterol (PROVENTIL) (2.5 MG/3ML) 0.083% nebulizer solution Take 3 mLs (2.5 mg total) by nebulization every 4 (four) hours as needed for wheezing. (Patient taking differently: Take 2.5 mg by nebulization daily as needed for wheezing. ) 75 mL 12  . Biotin w/ Vitamins C & E (HAIR/SKIN/NAILS PO) Take 1 tablet by mouth daily.    . Calcium Citrate-Vitamin D (CITRACAL PETITES/VITAMIN D) 200-250 MG-UNIT TABS Take 1 tablet by mouth daily.    Marland Kitchen dextromethorphan-guaiFENesin [facility]  DM) 30-600 MG 12hr tablet Take 1 tablet by mouth 2 (two) times daily as needed for cough.     . diazepam (VALIUM) 5 MG tablet Take 5 mg by mouth daily as needed for anxiety or muscle spasms. For anxiety    . docusate sodium (COLACE) 100 MG capsule Take 100 mg by mouth at bedtime.     . ferrous sulfate 325 (65 FE) MG tablet Take 325 mg by mouth daily with breakfast.    . HYDROcodone-acetaminophen (NORCO/VICODIN) 5-325 MG tablet Take 1 tablet by mouth every 6 (six) hours as needed for moderate pain. 10 tablet 0  . Lactobacillus (FLORAJEN ACIDOPHILUS PO) Take 1 capsule by mouth every morning.     Marland Kitchen lisinopril-hydrochlorothiazide (PRINZIDE,ZESTORETIC) 20-12.5 MG tablet 1 tablet daily. Reported on 08/03/2015    . omeprazole (PRILOSEC) 20 MG capsule Take 20 mg by mouth 2 (two) times daily before a meal.    . ondansetron (ZOFRAN) 4 MG tablet Take 4 mg by mouth every 8 (eight) hours as needed for nausea or vomiting.    . potassium chloride (K-DUR) 10 MEQ tablet Take 10 mEq by mouth 3 (three) times daily with meals.     . sertraline (ZOLOFT) 100 MG tablet Take 100 mg by mouth daily.    . traMADol (ULTRAM) 50 MG tablet Take 1 tablet (50 mg total) by mouth every 6 (six) hours as  needed for severe pain. 5 tablet 0  . traZODone (DESYREL) 50 MG tablet Take 50 mg by mouth at bedtime.    . vitamin B-12 (CYANOCOBALAMIN) 100 MCG tablet Take 500 mcg by mouth daily.      No current facility-administered medications on file prior to visit.         Objective:   Physical Exam  Today's Vitals   08/06/17 1119  BP: (!) 180/90  Pulse: 60  Temp: 98.5 F (36.9 C)  Weight: 213 lb 9.6 oz (96.9 kg)  Height: 5\' 3"  (1.6 m)   Alert and oriented. Skin warm and dry. Oral mucosa is moist.   . Sclera anicteric, conjunctivae is pink. Thyroid not enlarged. No cervical lymphadenopathy. Lungs clear. Heart regular rate and rhythm.  Abdomen is soft. Bowel sounds are positive. No hepatomegaly. No abdominal masses felt. No tenderness.  No edema to lower extremities.           Assessment & Plan:  Constipation. Am going to try her on Linzess and she how she does. She will call with a PR report in a week.  Multiple colon polyps. She is due for a surveillance colonoscopy.

## 2017-08-06 NOTE — Patient Instructions (Signed)
Samples of Linzess given to patient.

## 2017-08-14 ENCOUNTER — Telehealth (INDEPENDENT_AMBULATORY_CARE_PROVIDER_SITE_OTHER): Payer: Self-pay | Admitting: Internal Medicine

## 2017-08-14 DIAGNOSIS — Z86010 Personal history of colonic polyps: Secondary | ICD-10-CM | POA: Insufficient documentation

## 2017-08-14 NOTE — Telephone Encounter (Signed)
TCS 09/25/17, patient aware, instructions mailed

## 2017-08-14 NOTE — Telephone Encounter (Signed)
Ann colonoscopy

## 2017-08-19 ENCOUNTER — Other Ambulatory Visit: Payer: Self-pay

## 2017-08-19 ENCOUNTER — Telehealth: Payer: Self-pay | Admitting: Orthopedic Surgery

## 2017-08-19 ENCOUNTER — Emergency Department (HOSPITAL_COMMUNITY)
Admission: EM | Admit: 2017-08-19 | Discharge: 2017-08-19 | Disposition: A | Discharge status: Home / Self Care | Payer: Medicare Other | Attending: Emergency Medicine | Admitting: Emergency Medicine

## 2017-08-19 ENCOUNTER — Encounter (HOSPITAL_COMMUNITY): Payer: Self-pay | Admitting: Emergency Medicine

## 2017-08-19 DIAGNOSIS — Z96611 Presence of right artificial shoulder joint: Secondary | ICD-10-CM | POA: Diagnosis not present

## 2017-08-19 DIAGNOSIS — M25562 Pain in left knee: Secondary | ICD-10-CM | POA: Diagnosis present

## 2017-08-19 DIAGNOSIS — Z91040 Latex allergy status: Secondary | ICD-10-CM | POA: Insufficient documentation

## 2017-08-19 DIAGNOSIS — Z79899 Other long term (current) drug therapy: Secondary | ICD-10-CM | POA: Insufficient documentation

## 2017-08-19 DIAGNOSIS — I10 Essential (primary) hypertension: Secondary | ICD-10-CM | POA: Diagnosis not present

## 2017-08-19 MED ORDER — TRAMADOL HCL 50 MG PO TABS: 50.0000 mg | ORAL_TABLET | Freq: Four times a day (QID) | ORAL | 0 refills | Status: DC | PRN

## 2017-08-19 MED ORDER — KETOROLAC TROMETHAMINE 60 MG/2ML IM SOLN
30.0000 mg | Freq: Once | INTRAMUSCULAR | Status: AC
  Administered 2017-08-19: 30 mg via INTRAMUSCULAR
  Filled 2017-08-19: qty 2

## 2017-08-19 NOTE — Discharge Instructions (Addendum)
Use your walker for weight bearing. You may try wearing an ACE wrap on/off to your knee, but do not wear it continuously.  Call Dr. Ruthe Mannan office to arrange a follow-up appt.

## 2017-08-19 NOTE — ED Triage Notes (Signed)
Pt reports she had a fall 2 weeks ago and has been having left knee pain going up to hip since then.  Has been seen here and at her pcp for the same.  Thursday went to pcp and was given a knee brace but it will not stay up so she is not wearing it.

## 2017-08-19 NOTE — Telephone Encounter (Signed)
Patient called following Emergency room visits, regarding left knee. States had returned to [facility] emergency today and was given injection for pain. Relays she has been seeing Dr Lorin Mercy, however, said has been unable to get in to see him. Due to Emergency room, please advise if review for 2nd opinion or okay to schedule based on referral from Emergency department.

## 2017-08-20 NOTE — Telephone Encounter (Signed)
Called back to patient to relay. Reached voice mail, left message to return call.

## 2017-08-20 NOTE — Telephone Encounter (Signed)
What? See Dr Lorin Mercy   She can call that office and tell them what she told us

## 2017-08-21 NOTE — ED Provider Notes (Signed)
[facility] EMERGENCY DEPARTMENT Provider Note   CSN: [id number] Arrival date & time: 08/19/17  1238     History   Chief Complaint Chief Complaint  Patient presents with  . Knee Pain    HPI [name] [name] is a 77 y.o. female.  HPI   [name] [name] is a 77 y.o. female who presents to the Emergency Department complaining of persistent left knee pain from a fall that occurred 2 weeks ago.  She describes the pain as aching with weightbearing and radiating toward her left hip.  She was seen here after the fall and had a negative x-ray of her knee, she was also seen by her PCP for the same and given a knee brace which she states she cannot wear because the brace will not stay in place when she stands.  She has been taking over-the-counter pain relievers with minimal relief she is ambulatory with a walker.  She was given pain medications on her initial visit which she states helped, but she is ran out of the medication.  She has not followed up with orthopedics.  She denies swelling, redness, fever, chills, back pain, numbness or weakness of the lower extremities.  Past Medical History:  Diagnosis Date  . Anemia   . Anxiety   . Arthritis   . Barrett's esophagus    seen on 2016 egd at [facility]  . Depression   . GERD (gastroesophageal reflux disease)   . H/O hiatal hernia   . Hypertension   . Obesity   . Pneumonia   . PONV (postoperative nausea and vomiting)   . Seasonal allergies   . Sepsis ([place]) 10/02/2013    Patient Active Problem List   Diagnosis Date Noted  . History of colonic polyps 08/14/2017  . Choledocholithiasis   . Abnormal CT scan, esophagus   . Cholestasis 02/05/2017  . Depression 02/05/2017  . Hypomagnesemia 02/05/2017  . Bradycardia 02/05/2017  . Transaminitis 07/08/2016  . Constipation 07/08/2016  . GIB (gastrointestinal bleeding) 07/08/2016  . HCAP (healthcare-associated pneumonia) 09/23/2015  . AKI (acute kidney injury) ([place]) 07/21/2015  . Hypokalemia  07/21/2015  . CAP (community acquired pneumonia) 07/14/2015  . Upper abdominal pain 06/10/2014  . Folliculitis of perineum 02/24/2014  . Giant comedone 12/21/2013  . 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 Acrochordon 11/25/2013  . Dysphagia 10/29/2013  . Morbid obesity ([place]) 10/02/2013  . Hypertension   . GERD (gastroesophageal reflux disease)   . Anxiety   . Anemia of chronic disease   . 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 ABDOMINAL HYSTERECTOMY    . ABDOMINAL SURGERY    . APPENDECTOMY    . BACK SURGERY     lumbar x2 by Dr Arnoldo Morale  . CARPAL TUNNEL RELEASE Right 03/2014   Dr. Lorin Mercy  . CATARACT EXTRACTION W/PHACO Left 08/06/2013   Procedure: CATARACT EXTRACTION PHACO AND INTRAOCULAR LENS PLACEMENT LEFT EYE;  Surgeon: Tonny Branch, MD;  Location: [facility];  Service: Ophthalmology;  Laterality: Left;  CDE: 9.55  . CATARACT EXTRACTION W/PHACO Right 08/24/2013   Procedure: CATARACT EXTRACTION PHACO AND INTRAOCULAR LENS PLACEMENT (IOC);  Surgeon: Tonny Branch, MD;  Location: [facility];  Service: Ophthalmology;  Laterality: Right;  CDE:8.30  . CERVICAL FUSION    . CHOLECYSTECTOMY    . COLONOSCOPY N/A 11/03/2013   SLF: 1. Normal mucosa in the terminal ileum. 2. 13 colon polyps removed. 3. Moderate diverticulosis in the descending  colon and sigmoid colon 4. the left colon is redundant 5. Small internal  hemorrhoids.  . ESOPHAGOGASTRODUODENOSCOPY N/A 11/03/2013   SLF: 1. 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 Mild Non-erosive gastritis  . 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 s/p gastric bypass  . EYE SURGERY    . GASTRIC BYPASS  1979   revision in 1980   . HIATAL HERNIA REPAIR     [facility] in 2011  . JOINT REPLACEMENT Right 1995    hip  . JOINT REPLACEMENT Left 2008   hip  . MEDIAL PARTIAL KNEE REPLACEMENT Left    Dr Ronnie Derby  . PARAESOPHAGEAL HERNIA REPAIR  SEP 2010 DR. MCNATT  . SHOULDER ARTHROSCOPY Right   . SHOULDER ARTHROSCOPY Right    x2  . SKIN BIOPSY    . TONSILLECTOMY    . TOTAL SHOULDER ARTHROPLASTY Right 02/16/2013   Procedure: TOTAL SHOULDER ARTHROPLASTY;  Surgeon: Marybelle Killings, MD;  Location: [facility];  Service: Orthopedics;  Laterality: Right;  Right Total Shoulder Arthroplasty, Cemented     OB History   None      Home Medications    Prior to Admission medications   Medication Sig Start Date End Date Taking? Authorizing Provider  albuterol (PROVENTIL) (2.5 MG/3ML) 0.083% nebulizer solution Take 3 mLs (2.5 mg total) by nebulization every 4 (four) hours as needed for wheezing. Patient taking differently: Take 2.5 mg by nebulization daily as needed for wheezing.  07/21/15   Kathie Dike, MD  Biotin w/ Vitamins C & E (HAIR/SKIN/NAILS PO) Take 1 tablet by mouth daily.    [provider]  Calcium Citrate-Vitamin D (CITRACAL PETITES/VITAMIN D) 200-250 MG-UNIT TABS Take 1 tablet by mouth daily.    [provider]  dextromethorphan-guaiFENesin (MUCINEX DM) 30-600 MG 12hr tablet Take 1 tablet by mouth 2 (two) times daily as needed for cough.     [provider]  diazepam (VALIUM) 5 MG tablet Take 5 mg by mouth daily as needed for anxiety or muscle spasms. For anxiety    [provider]  docusate sodium (COLACE) 100 MG capsule Take 100 mg by mouth at bedtime.     [provider]  ferrous sulfate 325 (65 FE) MG tablet Take 325 mg by mouth daily with breakfast.    [provider]  HYDROcodone-acetaminophen (NORCO/VICODIN) 5-325 MG tablet Take 1 tablet by mouth every 6 (six) hours as needed for moderate pain. 08/05/17   Milton Ferguson, MD  Lactobacillus [facility] ACIDOPHILUS PO) Take 1 capsule by mouth every morning.     [provider]    lisinopril-hydrochlorothiazide (PRINZIDE,ZESTORETIC) 20-12.5 MG tablet 1 tablet daily. Reported on 08/03/2015 06/16/15   [provider]  omeprazole (PRILOSEC) 20 MG capsule Take 20 mg by mouth 2 (two) times daily before a meal.    [provider]  ondansetron (ZOFRAN) 4 MG tablet Take 4 mg by mouth every 8 (eight) hours as needed for nausea or vomiting.    [provider]  potassium chloride (K-DUR) 10 MEQ tablet Take 10 mEq by mouth 3 (three) times daily with meals.     [provider]  sertraline (ZOLOFT) 100 MG tablet Take 100 mg by mouth daily.    [provider]  traMADol (ULTRAM) 50 MG tablet Take 1 tablet (50 mg total) by mouth every 6 (six) hours as needed. 08/19/17  Garlon Tuggle, PA-C  traZODone (DESYREL) 50 MG tablet Take 50 mg by mouth at bedtime.    [provider]  vitamin B-12 (CYANOCOBALAMIN) 100 MCG tablet Take 500 mcg by mouth daily.     [provider]    Family History Family History  Problem Relation Age of Onset  . Colon cancer Brother        IN HIS 60s-METASTATIC  . Colon polyps Paternal Grandfather   . Breast cancer Mother   . Hypertension Father   . Heart disease Father   . Hypertension Brother   . Heart disease Brother     Social History Social History   Tobacco Use  . Smoking status: Never Smoker  . Smokeless tobacco: Never Used  Substance Use Topics  . Alcohol use: No  . Drug use: No     Allergies   Novocain [procaine hcl]; Other; Latex; Penicillins; and Sulfa antibiotics   Review of Systems Review of Systems  Constitutional: Negative for chills and fever.  Gastrointestinal: Negative for abdominal pain.  Genitourinary: Negative for difficulty urinating, dysuria and flank pain.  Musculoskeletal: Positive for arthralgias (Left knee pain). Negative for back pain, joint swelling and neck pain.  Skin: Negative for color change and wound.  Neurological: Negative for weakness and  numbness.  All other systems reviewed and are negative.    Physical Exam Updated Vital Signs BP (!) 156/71 (BP Location: Right Arm)   Pulse 81   Temp 97.8 F (36.6 C) (Oral)   Resp 20   Ht 5\' 3"  (1.6 m)   Wt 96.6 kg (213 lb)   SpO2 96%   BMI 37.73 kg/m   Physical Exam  Constitutional: She is oriented to person, place, and time. She appears well-developed and well-nourished. No distress.  HENT:  Head: Atraumatic.  Cardiovascular: Normal rate, regular rhythm and intact distal pulses.  Pulmonary/Chest: Effort normal and breath sounds normal. She exhibits no tenderness.  Abdominal: Soft. There is no tenderness.  Musculoskeletal: She exhibits tenderness. She exhibits no edema or deformity.  Mild tenderness to the lateral left knee.  No palpable effusion, erythema, or edema.  Negative drawer sign.  Patient has full range of motion of the knee.  Mild tenderness of the anterior left thigh, no external rotation or shortening of the extremity, hip flexors and extensors are intact  Neurological: She is alert and oriented to person, place, and time. No sensory deficit. She exhibits normal muscle tone. Coordination normal.  Skin: Skin is warm. Capillary refill takes less than 2 seconds. No rash noted. No erythema.  Nursing note and vitals reviewed.    ED Treatments / Results  Labs (all labs ordered are listed, but only abnormal results are displayed) Labs Reviewed - No data to display  EKG None  Radiology No results found.  Procedures Procedures (including critical care time)  Medications Ordered in ED Medications  ketorolac (TORADOL) injection 30 mg (30 mg Intramuscular Given 08/19/17 1402)     Initial Impression / Assessment and Plan / ED Course  I have reviewed the triage vital signs and the nursing notes.  Pertinent labs & imaging results that were available during my care of the patient were reviewed by me and considered in my medical decision making (see chart for  details).     Pt seen here 08/03/17 after fall.  Neg XR of left knee fm previous visit. Pt is wt bearing on the knee, NV intact using walker at baseline.  No concerning sx's for septic  joint, no palpable effusion or laxity on exam.  Pt sees Dr. Lorin Mercy, but has been unable to get an appt.  Offered additional bracing, but pt declined.  I feel that sx's are likely worsened due to pt 's continued wt bearing w/o support.  Will provide limited medication for pain control, advised orthopedic f/u.     Final Clinical Impressions(s) / ED Diagnoses   Final diagnoses:  Acute pain of left knee    ED Discharge Orders        Ordered    traMADol (ULTRAM) 50 MG tablet  Every 6 hours PRN     08/19/17 1423       Kem Parkinson, PA-C 08/21/17 1209    Isla Pence, MD 08/21/17 1254

## 2017-08-22 NOTE — Telephone Encounter (Signed)
No further response, if patient calls back, we will relay Dr Ruthe Mannan reply.

## 2017-08-29 ENCOUNTER — Encounter (INDEPENDENT_AMBULATORY_CARE_PROVIDER_SITE_OTHER): Payer: Self-pay | Admitting: Orthopaedic Surgery

## 2017-08-29 ENCOUNTER — Ambulatory Visit (INDEPENDENT_AMBULATORY_CARE_PROVIDER_SITE_OTHER): Payer: Medicare Other | Admitting: Orthopaedic Surgery

## 2017-08-29 VITALS — BP 157/92 | HR 91 | Ht 63.0 in | Wt 213.0 lb

## 2017-08-29 DIAGNOSIS — M7551 Bursitis of right shoulder: Secondary | ICD-10-CM

## 2017-08-29 DIAGNOSIS — M1712 Unilateral primary osteoarthritis, left knee: Secondary | ICD-10-CM

## 2017-08-29 MED ORDER — LIDOCAINE HCL 1 % IJ SOLN
0.5000 mL | INTRAMUSCULAR | Status: AC | PRN
  Administered 2017-08-29: .5 mL

## 2017-08-29 MED ORDER — BUPIVACAINE HCL 0.25 % IJ SOLN
4.0000 mL | INTRAMUSCULAR | Status: AC | PRN
Start: 2017-08-29 — End: 2017-08-29
  Administered 2017-08-29: 4 mL via INTRA_ARTICULAR

## 2017-08-29 MED ORDER — METHYLPREDNISOLONE ACETATE 40 MG/ML IJ SUSP
40.0000 mg | INTRAMUSCULAR | Status: AC | PRN
  Administered 2017-08-29: 40 mg via INTRA_ARTICULAR

## 2017-08-29 NOTE — Progress Notes (Signed)
Office Visit Note   Patient: [name] [name]           Date of Birth: 04/06/1940           MRN: [id number] Visit Date: 08/29/2017              Requested by: Curlene Labrum, MD [place], [place] [address] PCP: Curlene Labrum, MD   Assessment & Plan: Visit Diagnoses:  1. Bursitis of right shoulder     Plan: We will set her up for some physical therapy for lower extremity strengthening with the following history and continued problems with left knee pain after landing on her knee.  X-rays were negative for fracture in the emergency room.  Both total hips look good.  Injection performed right shoulder.  I will recheck her on a as needed basis.  She needs to continue to use a rolling walker and fall prevention was discussed.  Follow-Up Instructions: No follow-ups on file.   Orders:  No orders of the defined types were placed in this encounter.  No orders of the defined types were placed in this encounter.     Procedures: Large Joint Inj: R subacromial bursa on 08/29/2017 2:17 PM Indications: pain Details: 22 G 1.5 in needle  Arthrogram: No  Medications: 4 mL bupivacaine 0.25 %; 40 mg methylPREDNISolone acetate 40 MG/ML; 0.5 mL lidocaine 1 % Outcome: tolerated well, no immediate complications Procedure, treatment alternatives, risks and benefits explained, specific risks discussed. Consent was given by the patient. Immediately prior to procedure a time out was called to verify the correct patient, procedure, equipment, support staff and site/side marked as required. Patient was prepped and draped in the usual sterile fashion.       Clinical Data: No additional findings.   Subjective: Chief Complaint  Patient presents with  . Left Knee - Pain    HPI 77 year old female returns she states she fell twice last week trying to pull some floor bed leads up.  She was seen in the emergency room where x-rays were obtained at any pain which were negative for fracture.   She had total knee arthroplasty done by Dr. Lorre Nick as well as bilateral total hip arthroplasties.  She is had some pain in her groin and hip region worse on the left than right.  She is been having trouble walking using her Rollator and with pressure on her right shoulder she is had increased pain in her right shoulder.  She has some inferior position of the humeral head suggesting tearing of her rotator cuff.  Patient is requesting a right shoulder injection today.  Review of Systems positive for previous right shoulder arthroplasty 2014.  Bilateral total hip arthroplasties and left TKA by Dr. Lorre Nick.  Cataract surgery, positive for GERD, hypertension, pneumonia cholelithiasis, depression otherwise 14 point review of systems negative as it pertains to HPI.   Objective: Vital Signs: BP (!) 157/92   Pulse 91   Ht 5\' 3"  (1.6 m)   Wt 213 lb (96.6 kg)   BMI 37.73 kg/m   Physical Exam  Constitutional: She is oriented to person, place, and time. She appears well-developed.  HENT:  Head: Normocephalic.  Right Ear: External ear normal.  Left Ear: External ear normal.  Eyes: Pupils are equal, round, and reactive to light.  Neck: No tracheal deviation present. No thyromegaly present.  Cardiovascular: Normal rate.  Pulmonary/Chest: Effort normal.  Abdominal: Soft.  Increased BMI  Neurological: She is alert and oriented  to person, place, and time.  Skin: Skin is warm and dry.  Psychiatric: She has a normal mood and affect. Her behavior is normal.    Ortho Exam positive lateral sulcus sign right shoulder.  Well-healed right deltopectoral incision.  Acromioclavicular joint nontender.  Elbows full range of motion.  No ecchymosis noted over knees.  She has pain with left knee range of motion some quad weakness some pain with hip internal/external rotation.  Collateral ligaments are stable.  2+ knee effusion.  Specialty Comments:  No specialty comments available.  Imaging: No results found.   PMFS  History: Patient Active Problem List   Diagnosis Date Noted  . History of colonic polyps 08/14/2017  . Choledocholithiasis   . Abnormal CT scan, esophagus   . Cholestasis 02/05/2017  . Depression 02/05/2017  . Hypomagnesemia 02/05/2017  . Bradycardia 02/05/2017  . Transaminitis 07/08/2016  . Constipation 07/08/2016  . GIB (gastrointestinal bleeding) 07/08/2016  . HCAP (healthcare-associated pneumonia) 09/23/2015  . AKI (acute kidney injury) ([place]) 07/21/2015  . Hypokalemia 07/21/2015  . CAP (community acquired pneumonia) 07/14/2015  . Upper abdominal pain 06/10/2014  . Folliculitis of perineum 02/24/2014  . Giant comedone 12/21/2013  . 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 Acrochordon 11/25/2013  . Dysphagia 10/29/2013  . Morbid obesity ([place]) 10/02/2013  . Hypertension   . GERD (gastroesophageal reflux disease)   . Anxiety   . Anemia of chronic disease   . 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 Anemia   . Anxiety   . Arthritis   . Barrett's esophagus    [facility] on 2016 [facility]d at [facility]  . Depression   . GERD (gastroesophageal reflux disease)   . H/O hiatal hernia   . Hypertension   . Obesity   . Pneumonia   . PONV (postoperative nausea and vomiting)   . Seasonal allergies   . Sepsis ([place]) 10/02/2013    Family History  Problem Relation Age of Onset  . Colon cancer Brother        IN HIS 60s-METASTATIC  . Colon polyps Paternal Grandfather   . Breast cancer Mother   . Hypertension Father   . Heart disease Father   . Hypertension Brother   . Heart disease Brother     Past Surgical History:  Procedure Laterality Date  . ABDOMINAL HYSTERECTOMY    . ABDOMINAL SURGERY    . APPENDECTOMY    . BACK SURGERY     lumbar x2 by Dr Arnoldo Morale  . CARPAL TUNNEL RELEASE Right 03/2014   Dr. Lorin Mercy  . CATARACT EXTRACTION W/PHACO Left 08/06/2013   Procedure: CATARACT EXTRACTION PHACO AND INTRAOCULAR LENS PLACEMENT  LEFT EYE;  Surgeon: Tonny Branch, MD;  Location: [facility];  Service: Ophthalmology;  Laterality: Left;  CDE: 9.55  . CATARACT EXTRACTION W/PHACO Right 08/24/2013   Procedure: CATARACT EXTRACTION PHACO AND INTRAOCULAR LENS PLACEMENT (IOC);  Surgeon: Tonny Branch, MD;  Location: [facility];  Service: Ophthalmology;  Laterality: Right;  CDE:8.30  . CERVICAL FUSION    . CHOLECYSTECTOMY    . COLONOSCOPY N/A 11/03/2013   SLF: 1. Normal mucosa in the terminal ileum. 2. 13 colon polyps removed. 3. Moderate diverticulosis in the descending colon and sigmoid colon 4. the left colon is redundant 5. Small internal  hemorrhoids.  . ESOPHAGOGASTRODUODENOSCOPY N/A 11/03/2013   SLF: 1. 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 Mild Non-erosive gastritis  .  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 s/p gastric bypass  . EYE SURGERY    . GASTRIC BYPASS  1979   revision in 1980   . HIATAL HERNIA REPAIR     [facility] in 2011  . JOINT REPLACEMENT Right 1995   hip  . JOINT REPLACEMENT Left 2008   hip  . MEDIAL PARTIAL KNEE REPLACEMENT Left    Dr Ronnie Derby  . PARAESOPHAGEAL HERNIA REPAIR  SEP 2010 DR. MCNATT  . SHOULDER ARTHROSCOPY Right   . SHOULDER ARTHROSCOPY Right    x2  . SKIN BIOPSY    . TONSILLECTOMY    . TOTAL SHOULDER ARTHROPLASTY Right 02/16/2013   Procedure: TOTAL SHOULDER ARTHROPLASTY;  Surgeon: Marybelle Killings, MD;  Location: [facility];  Service: Orthopedics;  Laterality: Right;  Right Total Shoulder Arthroplasty, Cemented   Social History   Occupational History  . Not on file  Tobacco Use  . Smoking status: Never Smoker  . Smokeless tobacco: Never Used  Substance and Sexual Activity  . Alcohol use: No  . Drug use: No  . Sexual activity: Yes    Birth control/protection: Surgical

## 2017-09-25 ENCOUNTER — Other Ambulatory Visit: Payer: Self-pay

## 2017-09-25 ENCOUNTER — Encounter (HOSPITAL_COMMUNITY)
Admission: RE | Disposition: A | Discharge status: Home / Self Care | Payer: Self-pay | Source: Ambulatory Visit | Attending: Internal Medicine

## 2017-09-25 ENCOUNTER — Encounter (HOSPITAL_COMMUNITY): Payer: Self-pay | Admitting: *Deleted

## 2017-09-25 ENCOUNTER — Ambulatory Visit (HOSPITAL_COMMUNITY)
Admission: RE | Admit: 2017-09-25 | Discharge: 2017-09-25 | Disposition: A | Discharge status: Home / Self Care | Payer: Medicare Other | Source: Ambulatory Visit | Attending: Internal Medicine | Admitting: Internal Medicine

## 2017-09-25 DIAGNOSIS — Z882 Allergy status to sulfonamides status: Secondary | ICD-10-CM | POA: Diagnosis not present

## 2017-09-25 DIAGNOSIS — Z1211 Encounter for screening for malignant neoplasm of colon: Secondary | ICD-10-CM | POA: Insufficient documentation

## 2017-09-25 DIAGNOSIS — Z8371 Family history of colonic polyps: Secondary | ICD-10-CM | POA: Diagnosis not present

## 2017-09-25 DIAGNOSIS — K5730 Diverticulosis of large intestine without perforation or abscess without bleeding: Secondary | ICD-10-CM | POA: Insufficient documentation

## 2017-09-25 DIAGNOSIS — Z79899 Other long term (current) drug therapy: Secondary | ICD-10-CM | POA: Diagnosis not present

## 2017-09-25 DIAGNOSIS — K648 Other hemorrhoids: Secondary | ICD-10-CM | POA: Insufficient documentation

## 2017-09-25 DIAGNOSIS — K219 Gastro-esophageal reflux disease without esophagitis: Secondary | ICD-10-CM | POA: Insufficient documentation

## 2017-09-25 DIAGNOSIS — F419 Anxiety disorder, unspecified: Secondary | ICD-10-CM | POA: Diagnosis not present

## 2017-09-25 DIAGNOSIS — F329 Major depressive disorder, single episode, unspecified: Secondary | ICD-10-CM | POA: Insufficient documentation

## 2017-09-25 DIAGNOSIS — Z800 Family history of malignant neoplasm of digestive organs: Secondary | ICD-10-CM | POA: Insufficient documentation

## 2017-09-25 DIAGNOSIS — Z880 Allergy status to penicillin: Secondary | ICD-10-CM | POA: Diagnosis not present

## 2017-09-25 DIAGNOSIS — D125 Benign neoplasm of sigmoid colon: Secondary | ICD-10-CM | POA: Insufficient documentation

## 2017-09-25 DIAGNOSIS — D123 Benign neoplasm of transverse colon: Secondary | ICD-10-CM

## 2017-09-25 DIAGNOSIS — Z86010 Personal history of colonic polyps: Secondary | ICD-10-CM | POA: Insufficient documentation

## 2017-09-25 DIAGNOSIS — D122 Benign neoplasm of ascending colon: Secondary | ICD-10-CM | POA: Diagnosis not present

## 2017-09-25 DIAGNOSIS — I10 Essential (primary) hypertension: Secondary | ICD-10-CM | POA: Insufficient documentation

## 2017-09-25 DIAGNOSIS — Z09 Encounter for follow-up examination after completed treatment for conditions other than malignant neoplasm: Secondary | ICD-10-CM | POA: Diagnosis not present

## 2017-09-25 HISTORY — PX: COLONOSCOPY: SHX5424

## 2017-09-25 HISTORY — PX: POLYPECTOMY: SHX5525

## 2017-09-25 SURGERY — COLONOSCOPY
Anesthesia: Moderate Sedation

## 2017-09-25 MED ORDER — SODIUM CHLORIDE 0.9 % IV SOLN
Freq: Once | INTRAVENOUS | Status: AC
  Administered 2017-09-25: 1000 mL via INTRAVENOUS

## 2017-09-25 MED ORDER — MEPERIDINE HCL 50 MG/ML IJ SOLN
INTRAMUSCULAR | Status: AC
  Filled 2017-09-25: qty 1

## 2017-09-25 MED ORDER — SIMETHICONE 40 MG/0.6ML PO SUSP
ORAL | Status: DC | PRN
  Administered 2017-09-25: 11:00:00

## 2017-09-25 MED ORDER — MEPERIDINE HCL 50 MG/ML IJ SOLN
INTRAMUSCULAR | Status: DC | PRN
  Administered 2017-09-25 (×2): 25 mg via INTRAVENOUS

## 2017-09-25 MED ORDER — MIDAZOLAM HCL 5 MG/5ML IJ SOLN
INTRAMUSCULAR | Status: AC
  Filled 2017-09-25: qty 10

## 2017-09-25 MED ORDER — MIDAZOLAM HCL 5 MG/5ML IJ SOLN
INTRAMUSCULAR | Status: DC | PRN
  Administered 2017-09-25 (×4): 2 mg via INTRAVENOUS

## 2017-09-25 NOTE — H&P (Signed)
[name] [name] is an 77 y.o. female.   Chief Complaint: Patient is here for colonoscopy. HPI: Patient is 77 year old Caucasian female who has a history of multiple colonic adenomas and is here for surveillance colonoscopy.  Last exam was by Dr. Oneida Alar in September 2015.  She denies abdominal pain change in bowel habits or rectal bleeding. Family history is positive CRC in her brother who had surgery in his 27s and died in his 70s of unrelated causes.  Past Medical History:  Diagnosis Date  . Anemia   . Anxiety   . Arthritis   . Barrett's esophagus    seen on [facility] egd at [facility]  . Depression   . GERD (gastroesophageal reflux disease)   . H/O hiatal hernia   . Hypertension   . Obesity   . Pneumonia   . PONV (postoperative nausea and vomiting)   . Seasonal allergies   . Sepsis ([place]) 10/02/2013    Past Surgical History:  Procedure Laterality Date  . ABDOMINAL HYSTERECTOMY    . ABDOMINAL SURGERY    . APPENDECTOMY    . BACK SURGERY     lumbar x2 by Dr Arnoldo Morale  . CARPAL TUNNEL RELEASE Right 03/2014   Dr. Lorin Mercy  . CATARACT EXTRACTION W/PHACO Left 08/06/2013   Procedure: CATARACT EXTRACTION PHACO AND INTRAOCULAR LENS PLACEMENT LEFT EYE;  Surgeon: Tonny Branch, MD;  Location: [facility];  Service: Ophthalmology;  Laterality: Left;  CDE: 9.55  . CATARACT EXTRACTION W/PHACO Right 08/24/2013   Procedure: CATARACT EXTRACTION PHACO AND INTRAOCULAR LENS PLACEMENT (IOC);  Surgeon: Tonny Branch, MD;  Location: [facility];  Service: Ophthalmology;  Laterality: Right;  CDE:8.30  . CERVICAL FUSION    . CHOLECYSTECTOMY    . COLONOSCOPY N/A 11/03/2013   SLF: 1. Normal mucosa in the terminal ileum. 2. 13 colon polyps removed. 3. Moderate diverticulosis in the descending colon and sigmoid colon 4. the left colon is redundant 5. Small internal  hemorrhoids.  . ESOPHAGOGASTRODUODENOSCOPY N/A 11/03/2013   SLF: 1. 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 Mild Non-erosive  gastritis  . 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 s/p gastric bypass  . EYE SURGERY    . GASTRIC BYPASS  1979   revision in 1980   . HIATAL HERNIA REPAIR     [facility] in 2011  . JOINT REPLACEMENT Right 1995   hip  . JOINT REPLACEMENT Left 2008   hip  . MEDIAL PARTIAL KNEE REPLACEMENT Left    Dr Ronnie Derby  . PARAESOPHAGEAL HERNIA REPAIR  SEP 2010 DR. MCNATT  . SHOULDER ARTHROSCOPY Right   . SHOULDER ARTHROSCOPY Right    x2  . SKIN BIOPSY    . TONSILLECTOMY    . TOTAL SHOULDER ARTHROPLASTY Right 02/16/2013   Procedure: TOTAL SHOULDER ARTHROPLASTY;  Surgeon: Marybelle Killings, MD;  Location: [place];  Service: Orthopedics;  Laterality: Right;  Right Total Shoulder Arthroplasty, Cemented    Family History  Problem Relation Age of Onset  . Colon cancer Brother        IN HIS 60s-METASTATIC  . Colon polyps Paternal Grandfather   . Breast cancer Mother   . Hypertension Father   . Heart disease Father   . Hypertension Brother   . Heart disease Brother    Social History:  reports that she has never smoked. She has never used smokeless tobacco. She  reports that she does not drink alcohol or use drugs.  Allergies:  Allergies  Allergen Reactions  . Novocain [Procaine Hcl]     Unknown-passed out with novocaine injected for dental surgery.  . Other Hives    EKG pads - need to use pediatric pads  . Latex Rash  . Penicillins Rash    Has patient had a PCN reaction causing immediate rash, facial/tongue/throat swelling, SOB or lightheadedness with hypotension: Yes Has patient had a PCN reaction causing severe rash involving mucus membranes or skin necrosis: No Has patient had a PCN reaction that required hospitalization No Has patient had a PCN reaction occurring within the last 10 years: No  If all of the above answers are "NO", then may proceed with  Cephalosporin use.   . Sulfa Antibiotics Rash    Medications Prior to Admission  Medication Sig Dispense Refill  . acetaminophen (TYLENOL) 500 MG tablet Take 1,000 mg by mouth every 6 (six) hours as needed for mild pain or headache.    . Biotin w/ Vitamins C & E (HAIR/SKIN/NAILS PO) Take 1 tablet by mouth 3 (three) times daily.     . Calcium Citrate-Vitamin D (CITRACAL PETITES/VITAMIN D) 200-250 MG-UNIT TABS Take 1 tablet by mouth daily.    Marland Kitchen dextromethorphan-guaiFENesin (MUCINEX DM) 30-600 MG 12hr tablet Take 1 tablet by mouth 2 (two) times daily as needed for cough.     . docusate sodium (COLACE) 100 MG capsule Take 100 mg by mouth at bedtime as needed for mild constipation.     . Lactobacillus (FLORAJEN ACIDOPHILUS PO) Take 1 capsule by mouth every morning.     Marland Kitchen omeprazole (PRILOSEC) 20 MG capsule Take 20 mg by mouth 2 (two) times daily before a meal.    . potassium chloride (K-DUR) 10 MEQ tablet Take 10 mEq by mouth 3 (three) times daily with meals.     . sertraline (ZOLOFT) 100 MG tablet Take 100 mg by mouth daily.    . traZODone (DESYREL) 50 MG tablet Take 50 mg by mouth at bedtime.    Marland Kitchen albuterol (PROVENTIL) (2.5 MG/3ML) 0.083% nebulizer solution Take 3 mLs (2.5 mg total) by nebulization every 4 (four) hours as needed for wheezing. (Patient taking differently: Take 2.5 mg by nebulization daily as needed for wheezing. ) 75 mL 12  . diazepam (VALIUM) 5 MG tablet Take 5 mg by mouth daily as needed for anxiety or muscle spasms. For anxiety    . GLUCOSAMINE SULFATE-MSM EX Apply 1 application topically 3 (three) times daily as needed (pain). Roll on knee to relieve pain    . HYDROcodone-acetaminophen (NORCO/VICODIN) 5-325 MG tablet Take 1 tablet by mouth every 6 (six) hours as needed for moderate pain. (Patient not taking: Reported on 08/29/2017) 10 tablet 0  . ondansetron (ZOFRAN) 4 MG tablet Take 4 mg by mouth every 8 (eight) hours as needed for nausea or vomiting.    . traMADol (ULTRAM) 50  MG tablet Take 1 tablet (50 mg total) by mouth every 6 (six) hours as needed. (Patient not taking: Reported on 08/29/2017) 15 tablet 0  . vitamin B-12 (CYANOCOBALAMIN) 100 MCG tablet Take 100 mcg by mouth 2 (two) times a week.       No results found for this or any previous visit (from the past 48 hour(s)). No results found.  ROS  Blood pressure (!) 170/85, pulse 82, temperature 98.4 F (36.9 C), temperature source Oral, resp. rate 15, height 5\' 3"  (1.6 m), weight 213 lb (96.6 kg),  SpO2 99 %. Physical Exam  Constitutional: She appears well-developed and well-nourished.  HENT:  Mouth/Throat: Oropharynx is clear and moist.  Eyes: Conjunctivae are normal. No scleral icterus.  Neck: No thyromegaly present.  Cardiovascular: Normal rate, regular rhythm and normal heart sounds.  No murmur heard. Respiratory: Effort normal and breath sounds normal.  GI:  Abdomen is full.  She has long scar across upper abdomen.  She also has laparoscopy scars.  Abdomen is soft and nontender with organomegaly or masses.  Musculoskeletal: She exhibits no edema.  Lymphadenopathy:    She has no cervical adenopathy.  Neurological: She is alert.  Skin: Skin is warm and dry.     Assessment/Plan History of colonic adenomas. Family history of CRC in first-degree relative. Surveillance colonoscopy.  Hildred Laser, MD 09/25/2017, 10:28 AM

## 2017-09-25 NOTE — Discharge Instructions (Signed)
No aspirin or NSAIDs for 1 week. Resume usual medications as before. High-fiber diet. No driving for 24 hours. Physician will call with biopsy results.      Colonoscopy, Adult, Care After This sheet gives you information about how to care for yourself after your procedure. Your doctor may also give you more specific instructions. If you have problems or questions, call your doctor. Follow these instructions at home: General instructions   For the first 24 hours after the procedure: ? Do not drive or use machinery. ? Do not sign important documents. ? Do not drink alcohol. ? Do your daily activities more slowly than normal. ? Eat foods that are soft and easy to digest. ? Rest often.  Take over-the-counter or prescription medicines only as told by your doctor.  It is up to you to get the results of your procedure. Ask your doctor, or the department performing the procedure, when your results will be ready. To help cramping and bloating:  Try walking around.  Put heat on your belly (abdomen) as told by your doctor. Use a heat source that your doctor recommends, such as a moist heat pack or a heating pad. ? Put a towel between your skin and the heat source. ? Leave the heat on for 20-30 minutes. ? Remove the heat if your skin turns bright red. This is especially important if you cannot feel pain, heat, or cold. You can get burned. Eating and drinking  Drink enough fluid to keep your pee (urine) clear or pale yellow.  Return to your normal diet as told by your doctor. Avoid heavy or fried foods that are hard to digest.  Avoid drinking alcohol for as long as told by your doctor. Contact a doctor if:  You have blood in your poop (stool) 2-3 days after the procedure. Get help right away if:  You have more than a small amount of blood in your poop.  You see large clumps of tissue (blood clots) in your poop.  Your belly is swollen.  You feel sick to your stomach  (nauseous).  You throw up (vomit).  You have a fever.  You have belly pain that gets worse, and medicine does not help your pain. This information is not intended to replace advice given to you by your health care provider. Make sure you discuss any questions you have with your health care provider. Document Released: 03/24/2010 Document Revised: 11/14/2015 Document Reviewed: 11/14/2015 Elsevier Interactive Patient Education  2017 [place].     Colon Polyps Polyps are tissue growths inside the body. Polyps can grow in many places, including the large intestine (colon). A polyp may be a round bump or a mushroom-shaped growth. You could have one polyp or several. Most colon polyps are noncancerous (benign). However, some colon polyps can become cancerous over time. What are the causes? The exact cause of colon polyps is not known. What increases the risk? This condition is more likely to develop in people who:  Have a family history of colon cancer or colon polyps.  Are older than 32 or older than 45 if they are African American.  Have inflammatory bowel disease, such as ulcerative colitis or Crohn disease.  Are overweight.  Smoke cigarettes.  Do not get enough exercise.  Drink too much alcohol.  Eat a diet that is: ? High in fat and red meat. ? Low in fiber.  Had childhood cancer that was treated with abdominal radiation.  What are the signs or symptoms?  Most polyps do not cause symptoms. If you have symptoms, they may include:  Blood coming from your rectum when having a bowel movement.  Blood in your stool.The stool may look dark red or black.  A change in bowel habits, such as constipation or diarrhea.  How is this diagnosed? This condition is diagnosed with a colonoscopy. This is a procedure that uses a lighted, flexible scope to look at the inside of your colon. How is this treated? Treatment for this condition involves removing any polyps that are  found. Those polyps will then be tested for cancer. If cancer is found, your health care provider will talk to you about options for colon cancer treatment. Follow these instructions at home: Diet  Eat plenty of fiber, such as fruits, vegetables, and whole grains.  Eat foods that are high in calcium and vitamin D, such as milk, cheese, yogurt, eggs, liver, fish, and broccoli.  Limit foods high in fat, red meats, and processed meats, such as hot dogs, sausage, bacon, and lunch meats.  Maintain a healthy weight, or lose weight if recommended by your health care provider. General instructions  Do not smoke cigarettes.  Do not drink alcohol excessively.  Keep all follow-up visits as told by your health care provider. This is important. This includes keeping regularly scheduled colonoscopies. Talk to your health care provider about when you need a colonoscopy.  Exercise every day or as told by your health care provider. Contact a health care provider if:  You have new or worsening bleeding during a bowel movement.  You have new or increased blood in your stool.  You have a change in bowel habits.  You unexpectedly lose weight. This information is not intended to replace advice given to you by your health care provider. Make sure you discuss any questions you have with your health care provider. Document Released: 11/16/2003 Document Revised: 07/28/2015 Document Reviewed: 01/10/2015 Elsevier Interactive Patient Education  2018 [place].    High-Fiber Diet Fiber, also called dietary fiber, is a type of carbohydrate found in fruits, vegetables, whole grains, and beans. A high-fiber diet can have many health benefits. Your health care provider may recommend a high-fiber diet to help:  Prevent constipation. Fiber can make your bowel movements more regular.  Lower your cholesterol.  Relieve hemorrhoids, uncomplicated diverticulosis, or irritable bowel syndrome.  Prevent  overeating as part of a weight-loss plan.  Prevent heart disease, type 2 diabetes, and certain cancers.  What is my plan? The recommended daily intake of fiber includes:  38 grams for men under age 78.  45 grams for men over age 52.  7 grams for women under age 40.  7 grams for women over age 39.  You can get the recommended daily intake of dietary fiber by eating a variety of fruits, vegetables, grains, and beans. Your health care provider may also recommend a fiber supplement if it is not possible to get enough fiber through your diet. What do I need to know about a high-fiber diet?  Fiber supplements have not been widely studied for their effectiveness, so it is better to get fiber through food sources.  Always check the fiber content on thenutrition facts label of any prepackaged food. Look for foods that contain at least 5 grams of fiber per serving.  Ask your dietitian if you have questions about specific foods that are related to your condition, especially if those foods are not listed in the following section.  Increase your daily  fiber consumption gradually. Increasing your intake of dietary fiber too quickly may cause bloating, cramping, or gas.  Drink plenty of water. Water helps you to digest fiber. What foods can I eat? Grains Whole-grain breads. Multigrain cereal. Oats and oatmeal. Brown rice. Barley. Bulgur wheat. [place]. Bran muffins. Popcorn. Rye wafer crackers. Vegetables Sweet potatoes. Spinach. Kale. Artichokes. Cabbage. Broccoli. Green peas. Carrots. Squash. Fruits Berries. Pears. Apples. Oranges. Avocados. Prunes and raisins. Dried figs. Meats and Other Protein Sources [place], kidney, pinto, and soy beans. Split peas. Lentils. Nuts and seeds. Dairy Fiber-fortified yogurt. Beverages Fiber-fortified soy milk. Fiber-fortified orange juice. Other Fiber bars. The items listed above may not be a complete list of recommended foods or beverages. Contact your  dietitian for more options. What foods are not recommended? Grains White bread. Pasta made with refined flour. White rice. Vegetables Fried potatoes. Canned vegetables. Well-cooked vegetables. Fruits Fruit juice. Cooked, strained fruit. Meats and Other Protein Sources Fatty cuts of meat. Fried Sales executive or fried fish. Dairy Milk. Yogurt. Cream cheese. Sour cream. Beverages Soft drinks. Other Cakes and pastries. Butter and oils. The items listed above may not be a complete list of foods and beverages to avoid. Contact your dietitian for more information. What are some tips for including high-fiber foods in my diet?  Eat a wide variety of high-fiber foods.  Make sure that half of all grains consumed each day are whole grains.  Replace breads and cereals made from refined flour or white flour with whole-grain breads and cereals.  Replace white rice with brown rice, bulgur wheat, or millet.  Start the day with a breakfast that is high in fiber, such as a cereal that contains at least 5 grams of fiber per serving.  Use beans in place of meat in soups, salads, or pasta.  Eat high-fiber snacks, such as berries, raw vegetables, nuts, or popcorn. This information is not intended to replace advice given to you by your health care provider. Make sure you discuss any questions you have with your health care provider. Document Released: 02/19/2005 Document Revised: 07/28/2015 Document Reviewed: 08/04/2013 Elsevier Interactive Patient Education  [employer].

## 2017-09-25 NOTE — Op Note (Signed)
[facility] Patient Name: [name] [name] Procedure Date: 09/25/2017 10:15 AM MRN: [id number] Date of Birth: 1941/01/01 Attending MD: Hildred Laser , MD CSN: [id number] Age: 77 Admit Type: Outpatient Procedure:                Colonoscopy Indications:              High risk colon cancer surveillance: Personal                            history of colonic polyps, Family history of colon                            cancer in a first-degree relative Providers:                Hildred Laser, MD, Otis Peak B. Sharon Seller, RN, Randa Spike, Technician Referring MD:             Curlene Labrum Medicines:                Meperidine 50 mg IV, Midazolam 8 mg IV Complications:            No immediate complications. Estimated Blood Loss:     Estimated blood loss was minimal. Procedure:                Pre-Anesthesia Assessment:                           - Prior to the procedure, a History and Physical                            was performed, and patient medications and                            allergies were reviewed. The patient's tolerance of                            previous anesthesia was also reviewed. The risks                            and benefits of the procedure and the sedation                            options and risks were discussed with the patient.                            All questions were answered, and informed consent                            was obtained. Prior Anticoagulants: The patient has                            taken no previous anticoagulant or antiplatelet  agents. ASA Grade Assessment: II - A patient with                            mild systemic disease. After reviewing the risks                            and benefits, the patient was deemed in                            satisfactory condition to undergo the procedure.                           After obtaining informed consent, the colonoscope                  was passed under direct vision. Throughout the                            procedure, the patient's blood pressure, pulse, and                            oxygen saturations were monitored continuously. The                            PCF-[id number] ([id number]) was introduced through the                            anus and advanced to the the cecum, identified by                            appendiceal orifice and ileocecal valve. The                            colonoscopy was somewhat difficult. Successful                            completion of the procedure was aided by                            {skip}scope guide. The ileocecal valve, appendiceal                            orifice, and rectum were photographed. The quality                            of the bowel preparation was adequate to identify                            polyps. Scope In: 10:42:07 AM Scope Out: 11:28:58 AM Scope Withdrawal Time: 0 hours 39 minutes 19 seconds  Total Procedure Duration: 0 hours 46 minutes 51 seconds  Findings:      The perianal and digital rectal examinations were normal.      Seven sessile polyps were found in the sigmoid colon, splenic flexure,       transverse colon and ascending colon.  The polyps were small in size.       These polyps were removed with a cold snare. Resection and retrieval       were complete. The pathology specimen was placed into Bottle Number 1.      Two sessile polyps were found in the sigmoid colon and ascending colon.       The polyps were small in size. These were biopsied with a cold forceps       for histology. The pathology specimen was placed into Bottle Number 1.      A 12 mm polyp was found in the distal sigmoid colon. The polyp was       semi-pedunculated. The polyp was removed with a hot snare. Resection and       retrieval were complete. The pathology specimen was placed into Bottle       Number 2.      Scattered medium-mouthed diverticula were found in  the sigmoid colon.      Internal hemorrhoids were found during retroflexion. The hemorrhoids       were small. Impression:               - Seven small polyps in the sigmoid colon, at the                            splenic flexure, in the transverse colon and in the                            ascending colon, removed with a cold snare.                            Resected and retrieved.                           - Two small polyps in the sigmoid colon and in the                            ascending colon. Biopsied.                           - One 12 mm polyp in the distal sigmoid colon,                            removed with a hot snare. Resected and retrieved.                           - Diverticulosis in the sigmoid colon.                           - Internal hemorrhoids.                           Comment: 10 poylps removed. Largest was 12 mm;                            other small Moderate Sedation:      Moderate (conscious) sedation was administered by the endoscopy nurse       and supervised  by the endoscopist. The following parameters were       monitored: oxygen saturation, heart rate, blood pressure, CO2       capnography and response to care. Total physician intraservice time was       54 minutes. Recommendation:           - Patient has a contact number available for                            emergencies. The signs and symptoms of potential                            delayed complications were discussed with the                            patient. Return to normal activities tomorrow.                            Written discharge instructions were provided to the                            patient.                           - High fiber diet today.                           - Continue present medications.                           - No aspirin, ibuprofen, naproxen, or other                            non-steroidal anti-inflammatory drugs for 7 days                             after polyp removal.                           - Await pathology results.                           - Repeat colonoscopy in 3 years for surveillance. Procedure Code(s):        --- Professional ---                           [phone], Colonoscopy, flexible; with removal of                            tumor(s), polyp(s), or other lesion(s) by snare                            technique                           63016, 9, Colonoscopy, flexible; with biopsy,  single or multiple                           G0500, Moderate sedation services provided by the                            same physician or other qualified health care                            professional performing a gastrointestinal                            endoscopic service that sedation supports,                            requiring the presence of an independent trained                            observer to assist in the monitoring of the                            patient's level of consciousness and physiological                            status; initial 15 minutes of intra-service time;                            patient age 4 years or older (additional time may                            be reported with [phone], as appropriate)                           [phone], Moderate sedation services provided by the                            same physician or other qualified health care                            professional performing the diagnostic or                            therapeutic service that the sedation supports,                            requiring the presence of an independent trained                            observer to assist in the monitoring of the                            patient's level of consciousness and physiological                            status;  each additional 15 minutes intraservice                            time (List separately in addition to code for                             primary service)                           [phone], Moderate sedation services provided by the                            same physician or other qualified health care                            professional performing the diagnostic or                            therapeutic service that the sedation supports,                            requiring the presence of an independent trained                            observer to assist in the monitoring of the                            patient's level of consciousness and physiological                            status; each additional 15 minutes intraservice                            time (List separately in addition to code for                            primary service)                           [phone], Moderate sedation services provided by the                            same physician or other qualified health care                            professional performing the diagnostic or                            therapeutic service that the sedation supports,                            requiring the presence of an independent trained                            observer to assist in the monitoring  of the                            patient's level of consciousness and physiological                            status; each additional 15 minutes intraservice                            time (List separately in addition to code for                            primary service) Diagnosis Code(s):        --- Professional ---                           Z86.010, Personal history of colonic polyps                           D12.3, Benign neoplasm of transverse colon (hepatic                            flexure or splenic flexure)                           D12.5, Benign neoplasm of sigmoid colon                           D12.2, Benign neoplasm of ascending colon                           K64.8, Other hemorrhoids                           Z80.0, Family  history of malignant neoplasm of                            digestive organs                           K57.30, Diverticulosis of large intestine without                            perforation or abscess without bleeding CPT copyright 2017 American [employer] Association. All rights reserved. The codes documented in this report are preliminary and upon coder review may  be revised to meet current compliance requirements. Hildred Laser, MD Hildred Laser, MD 09/25/2017 11:43:45 AM This report has been signed electronically. Number of Addenda: 0

## 2017-09-30 ENCOUNTER — Encounter (HOSPITAL_COMMUNITY): Payer: Self-pay | Admitting: Internal Medicine

## 2017-11-29 ENCOUNTER — Emergency Department (HOSPITAL_COMMUNITY)
Admission: EM | Admit: 2017-11-29 | Discharge: 2017-11-29 | Disposition: A | Discharge status: Home / Self Care | Payer: Medicare Other | Attending: Emergency Medicine | Admitting: Emergency Medicine

## 2017-11-29 ENCOUNTER — Other Ambulatory Visit: Payer: Self-pay

## 2017-11-29 ENCOUNTER — Encounter (HOSPITAL_COMMUNITY): Payer: Self-pay | Admitting: Emergency Medicine

## 2017-11-29 ENCOUNTER — Emergency Department (HOSPITAL_COMMUNITY): Payer: Medicare Other

## 2017-11-29 DIAGNOSIS — Z91040 Latex allergy status: Secondary | ICD-10-CM | POA: Diagnosis not present

## 2017-11-29 DIAGNOSIS — M25532 Pain in left wrist: Secondary | ICD-10-CM | POA: Diagnosis not present

## 2017-11-29 DIAGNOSIS — Z79899 Other long term (current) drug therapy: Secondary | ICD-10-CM | POA: Diagnosis not present

## 2017-11-29 DIAGNOSIS — M25552 Pain in left hip: Secondary | ICD-10-CM | POA: Diagnosis present

## 2017-11-29 DIAGNOSIS — R600 Localized edema: Secondary | ICD-10-CM | POA: Diagnosis not present

## 2017-11-29 DIAGNOSIS — Z96643 Presence of artificial hip joint, bilateral: Secondary | ICD-10-CM | POA: Diagnosis not present

## 2017-11-29 DIAGNOSIS — I10 Essential (primary) hypertension: Secondary | ICD-10-CM | POA: Diagnosis not present

## 2017-11-29 DIAGNOSIS — W19XXXA Unspecified fall, initial encounter: Secondary | ICD-10-CM

## 2017-11-29 DIAGNOSIS — W010XXA Fall on same level from slipping, tripping and stumbling without subsequent striking against object, initial encounter: Secondary | ICD-10-CM | POA: Insufficient documentation

## 2017-11-29 MED ORDER — TRAMADOL HCL 50 MG PO TABS: 50.0000 mg | ORAL_TABLET | Freq: Four times a day (QID) | ORAL | 0 refills | Status: DC | PRN

## 2017-11-29 MED ORDER — TRAMADOL HCL 50 MG PO TABS
50.0000 mg | ORAL_TABLET | Freq: Once | ORAL | Status: AC
  Administered 2017-11-29: 50 mg via ORAL
  Filled 2017-11-29: qty 1

## 2017-11-29 NOTE — ED Notes (Signed)
Patient in xray 

## 2017-11-29 NOTE — Discharge Instructions (Signed)
X-rays of your left wrist and left hip without any acute bony injuries.  With your left wrist she has some tenderness over the snuffbox which can be a navicular fracture which oftentimes does not show up immediately on x-rays.  Where the Velcro splint make an appointment to follow-up with [facility] orthopedics.  Take the tramadol prescription provided.

## 2017-11-29 NOTE — ED Triage Notes (Signed)
Pt became dizzy and fell earlier today. Pt c/o pain to left wrist. Swelling noted. Denies LOC. Landed on left hip. Pt moving both legs. Nad.

## 2017-11-29 NOTE — ED Provider Notes (Signed)
[facility] EMERGENCY DEPARTMENT Provider Note   CSN: [id number] Arrival date & time: 11/29/17  1847     History   Chief Complaint Chief Complaint  Patient presents with  . Fall    HPI [name] [name] is a 77 y.o. female.  Patient status post fall earlier today outside.  Patient got a little dizzy and fell no loss of consciousness.  No syncopal event.  Landed on her left hand arm and left hip.  Patient's had a hip replacement on that side in the past.  Denies hitting her head.  No chest pain no shortness of breath no low back pain.  No abdominal pain.  No headache.  Patient states there was no wounds and no bleeding.  Patient is not on any blood thinners.  Patient states she has been followed by [place] orthopedics in the past.     Past Medical History:  Diagnosis Date  . Anemia   . Anxiety   . Arthritis   . Barrett's esophagus    seen on 2016 egd at [facility]  . Depression   . GERD (gastroesophageal reflux disease)   . H/O hiatal hernia   . Hypertension   . Obesity   . Pneumonia   . PONV (postoperative nausea and vomiting)   . Seasonal allergies   . Sepsis ([place]) 10/02/2013    Patient Active Problem List   Diagnosis Date Noted  . History of colonic polyps 08/14/2017  . Choledocholithiasis   . Abnormal CT scan, esophagus   . Cholestasis 02/05/2017  . Depression 02/05/2017  . Hypomagnesemia 02/05/2017  . Bradycardia 02/05/2017  . Transaminitis 07/08/2016  . Constipation 07/08/2016  . GIB (gastrointestinal bleeding) 07/08/2016  . HCAP (healthcare-associated pneumonia) 09/23/2015  . AKI (acute kidney injury) ([place]) 07/21/2015  . Hypokalemia 07/21/2015  . CAP (community acquired pneumonia) 07/14/2015  . Upper abdominal pain 06/10/2014  . Folliculitis of perineum 02/24/2014  . Giant comedone 12/21/2013  . 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 Acrochordon 11/25/2013  . Dysphagia 10/29/2013  . Morbid obesity ([place]) 10/02/2013  . Hypertension   . GERD  (gastroesophageal reflux disease)   . Anxiety   . Anemia of chronic disease   . 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 ABDOMINAL HYSTERECTOMY    . ABDOMINAL SURGERY    . APPENDECTOMY    . BACK SURGERY     lumbar x2 by Dr Arnoldo Morale  . CARPAL TUNNEL RELEASE Right 03/2014   Dr. Lorin Mercy  . CATARACT EXTRACTION W/PHACO Left 08/06/2013   Procedure: CATARACT EXTRACTION PHACO AND INTRAOCULAR LENS PLACEMENT LEFT EYE;  Surgeon: Tonny Branch, MD;  Location: [facility] ORS;  Service: Ophthalmology;  Laterality: Left;  CDE: 9.55  . CATARACT EXTRACTION W/PHACO Right 08/24/2013   Procedure: CATARACT EXTRACTION PHACO AND INTRAOCULAR LENS PLACEMENT (IOC);  Surgeon: Tonny Branch, MD;  Location: [facility] ORS;  Service: Ophthalmology;  Laterality: Right;  CDE:8.30  . CERVICAL FUSION    . CHOLECYSTECTOMY    . COLONOSCOPY N/A 11/03/2013   SLF: 1. Normal mucosa in the terminal ileum. 2. 13 colon polyps removed. 3. Moderate diverticulosis in the descending colon and sigmoid colon 4. the left colon is redundant 5. Small internal  hemorrhoids.  . COLONOSCOPY N/A 09/25/2017   Procedure: COLONOSCOPY;  Surgeon: Rogene Houston, MD;  Location: [facility] ENDO SUITE;  Service: Endoscopy;  Laterality: N/A;  1:55  . ESOPHAGOGASTRODUODENOSCOPY N/A 11/03/2013   SLF: 1. Dysphagia most likely  due to sliding gastic pouch and non- adherence to gastric bypass diet 2. Mild Non-erosive gastritis  . 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 s/p gastric bypass  . EYE SURGERY    . GASTRIC BYPASS  1979   revision in 1980   . HIATAL HERNIA REPAIR     [facility] in 2011  . JOINT REPLACEMENT Right 1995   hip  . JOINT REPLACEMENT Left 2008   hip  . MEDIAL PARTIAL KNEE REPLACEMENT Left    Dr Ronnie Derby  . PARAESOPHAGEAL HERNIA REPAIR  SEP 2010 DR. MCNATT  . POLYPECTOMY   09/25/2017   Procedure: POLYPECTOMY;  Surgeon: Rogene Houston, MD;  Location: [facility];  Service: Endoscopy;;  colon  . SHOULDER ARTHROSCOPY Right   . SHOULDER ARTHROSCOPY Right    x2  . SKIN BIOPSY    . TONSILLECTOMY    . TOTAL SHOULDER ARTHROPLASTY Right 02/16/2013   Procedure: TOTAL SHOULDER ARTHROPLASTY;  Surgeon: Marybelle Killings, MD;  Location: [place];  Service: Orthopedics;  Laterality: Right;  Right Total Shoulder Arthroplasty, Cemented     OB History   None      Home Medications    Prior to Admission medications   Medication Sig Start Date End Date Taking? Authorizing Provider  acetaminophen (TYLENOL) 500 MG tablet Take 1,000 mg by mouth every 6 (six) hours as needed for mild pain or headache.    [provider]  albuterol (PROVENTIL) (2.5 MG/3ML) 0.083% nebulizer solution Take 3 mLs (2.5 mg total) by nebulization every 4 (four) hours as needed for wheezing. Patient taking differently: Take 2.5 mg by nebulization daily as needed for wheezing.  07/21/15   Kathie Dike, MD  Biotin w/ Vitamins C & E (HAIR/SKIN/NAILS PO) Take 1 tablet by mouth 3 (three) times daily.     [provider]  Calcium Citrate-Vitamin D (CITRACAL PETITES/VITAMIN D) 200-250 MG-UNIT TABS Take 1 tablet by mouth daily.    [provider]  dextromethorphan-guaiFENesin (MUCINEX DM) 30-600 MG 12hr tablet Take 1 tablet by mouth 2 (two) times daily as needed for cough.     [provider]  diazepam (VALIUM) 5 MG tablet Take 5 mg by mouth daily as needed for anxiety or muscle spasms. For anxiety    [provider]  docusate sodium (COLACE) 100 MG capsule Take 100 mg by mouth at bedtime as needed for mild constipation.     [provider]  GLUCOSAMINE SULFATE-MSM EX Apply 1 application topically 3 (three) times daily as needed (pain). Roll on knee to relieve pain    [provider]  Lactobacillus (FLORAJEN ACIDOPHILUS PO) Take 1 capsule by mouth every  morning.     [provider]  omeprazole (PRILOSEC) 20 MG capsule Take 20 mg by mouth 2 (two) times daily before a meal.    [provider]  ondansetron (ZOFRAN) 4 MG tablet Take 4 mg by mouth every 8 (eight) hours as needed for nausea or vomiting.    [provider]  potassium chloride (K-DUR) 10 MEQ tablet Take 10 mEq by mouth 3 (three) times daily with meals.     [provider]  sertraline (ZOLOFT) 100 MG tablet Take 100 mg by mouth daily.    [provider]  traMADol (ULTRAM) 50 MG tablet Take 1 tablet (50 mg total) by mouth every 6 (six) hours as needed. 11/29/17   Fredia Sorrow, MD  traZODone (DESYREL) 50 MG tablet Take 50 mg by mouth at bedtime.    [provider]  vitamin B-12 (CYANOCOBALAMIN) 100 MCG tablet Take 100 mcg by mouth 2 (two) times a week.     [provider]    Family History Family History  Problem Relation Age of Onset  . Colon cancer Brother        IN HIS 60s-METASTATIC  . Colon polyps Paternal Grandfather   . Breast cancer Mother   . Hypertension Father   . Heart disease Father   . Hypertension Brother   . Heart disease Brother     Social History Social History   Tobacco Use  . Smoking status: Never Smoker  . Smokeless tobacco: Never Used  Substance Use Topics  . Alcohol use: No  . Drug use: No     Allergies   Novocain [procaine hcl]; Other; Latex; Penicillins; and Sulfa antibiotics   Review of Systems Review of Systems  Constitutional: Negative for fever.  HENT: Negative for congestion.   Eyes: Negative for visual disturbance.  Respiratory: Negative for shortness of breath.   Cardiovascular: Negative for chest pain.  Gastrointestinal: Negative for abdominal pain, nausea and vomiting.  Genitourinary: Negative for hematuria.  Musculoskeletal: Negative for back pain and neck pain.  Skin: Negative for wound.  Neurological: Positive for dizziness. Negative for syncope and  headaches.  Hematological: Does not bruise/bleed easily.  Psychiatric/Behavioral: Negative for confusion.     Physical Exam Updated Vital Signs BP (!) 158/105 (BP Location: Right Arm)   Pulse 80   Temp 97.9 F (36.6 C) (Oral)   Resp 16   SpO2 100%   Physical Exam  Constitutional: She is oriented to person, place, and time. She appears well-developed and well-nourished. No distress.  HENT:  Head: Normocephalic and atraumatic.  Mouth/Throat: Oropharynx is clear and moist.  Eyes: Pupils are equal, round, and reactive to light. Conjunctivae and EOM are normal.  Neck: Normal range of motion. Neck supple.  Cardiovascular: Normal rate, regular rhythm and normal heart sounds.  Pulmonary/Chest: Effort normal and breath sounds normal. No respiratory distress.  Abdominal: Soft. Bowel sounds are normal. There is no tenderness.  Musculoskeletal: Normal range of motion. She exhibits edema and tenderness.  Patient left wrist is swollen.  Radial pulses 1+.  Good cap refill to the fingers.  Patient has good movement of her fingers.  No significant pain with range of motion at the elbow or shoulder.  Does have tenderness over the snuffbox area.  No obvious deformity.  Patient's left hip without any abrasion or bruising no obvious deformity no shortening of leg.  Patient with some pain with range of motion at the hip but has reasonable range of motion.  Distally sensation intact cap refill to toes is less than 2 seconds.  Neurological: She is alert and oriented to person, place, and time. No cranial nerve deficit or sensory deficit. She exhibits normal muscle tone. Coordination abnormal.  Skin: Skin is warm.  Nursing note and vitals reviewed.    ED Treatments / Results  Labs (all labs ordered are listed, but only abnormal results are displayed) Labs Reviewed - No data to display  EKG None  Radiology Dg Wrist Complete Left  Result Date: 11/29/2017 CLINICAL DATA:  Acute left wrist pain after  fall. EXAM: LEFT WRIST - COMPLETE 3+ VIEW COMPARISON:  None. FINDINGS: There is no evidence of fracture or dislocation. Severe degenerative changes seen involving the first carpometacarpal joint. Soft tissues are unremarkable.  IMPRESSION: Osteoarthritis of first carpometacarpal joint. No acute abnormality seen in the left wrist. Electronically Signed   By: Marijo Conception, M.D.   On: 11/29/2017 20:17   Dg Hip Unilat W Or Wo Pelvis 2-3 Views Left  Result Date: 11/29/2017 CLINICAL DATA:  Left hip pain status post fall. EXAM: DG HIP (WITH OR WITHOUT PELVIS) 2-3V LEFT COMPARISON:  None. FINDINGS: Generalized osteopenia. No acute fracture or dislocation. Total bilateral hip arthroplasties. No hardware failure or complication. Mild calcification the left trochanter Generalized osteopenia.  No aggressive osseous lesion. IMPRESSION: 1. Left total hip arthroplasty without hardware failure or complication. No acute osseous injury of the left hip. Electronically Signed   By: Kathreen Devoid   On: 11/29/2017 20:16    Procedures Procedures (including critical care time)  Medications Ordered in ED Medications  traMADol (ULTRAM) tablet 50 mg (50 mg Oral Given 11/29/17 2111)     Initial Impression / Assessment and Plan / ED Course  I have reviewed the triage vital signs and the nursing notes.  Pertinent labs & imaging results that were available during my care of the patient were reviewed by me and considered in my medical decision making (see chart for details).     Patient with a fall outside.  She got lightheaded and dizzy not eliciting any vertigo kind of symptoms.  And she fell no loss of consciousness denies hitting her head.  She has swelling to her left wrist there is snuffbox tenderness there.  X-rays are negative so still concern for an occult scaphoid fracture.  X-rays of her left hip show that the hip replacement is intact and no evidence of any bony abnormalities.  Patient's been followed by [place]  orthopedics in the past.  Patient has taken tramadol in the past for pain.  Did not take any today.  We will give the [place] prescription for tramadol and have her follow-up with orthopedics.  Left wrist put in a Velcro splint.  Patient will elevate.  Final Clinical Impressions(s) / ED Diagnoses   Final diagnoses:  Fall, initial encounter  Hip pain, acute, left  Acute pain of left wrist    ED Discharge Orders         Ordered    traMADol (ULTRAM) 50 MG tablet  Every 6 hours PRN     11/29/17 2124           Fredia Sorrow, MD 11/29/17 2128

## 2017-12-05 ENCOUNTER — Ambulatory Visit (INDEPENDENT_AMBULATORY_CARE_PROVIDER_SITE_OTHER): Payer: Medicare Other | Admitting: Orthopaedic Surgery

## 2018-03-27 ENCOUNTER — Ambulatory Visit (INDEPENDENT_AMBULATORY_CARE_PROVIDER_SITE_OTHER): Payer: Medicare Other

## 2018-03-27 ENCOUNTER — Ambulatory Visit (INDEPENDENT_AMBULATORY_CARE_PROVIDER_SITE_OTHER): Payer: Medicare Other | Admitting: Orthopaedic Surgery

## 2018-03-27 ENCOUNTER — Encounter (INDEPENDENT_AMBULATORY_CARE_PROVIDER_SITE_OTHER): Payer: Self-pay | Admitting: Orthopaedic Surgery

## 2018-03-27 VITALS — BP 169/97 | HR 88 | Ht 63.0 in | Wt 219.0 lb

## 2018-03-27 DIAGNOSIS — M19011 Primary osteoarthritis, right shoulder: Secondary | ICD-10-CM

## 2018-03-27 DIAGNOSIS — Z6838 Body mass index (BMI) 38.0-38.9, adult: Secondary | ICD-10-CM | POA: Diagnosis not present

## 2018-03-27 DIAGNOSIS — G8929 Other chronic pain: Secondary | ICD-10-CM

## 2018-03-27 DIAGNOSIS — M25561 Pain in right knee: Secondary | ICD-10-CM

## 2018-03-27 NOTE — Progress Notes (Signed)
Office Visit Note   Patient: [name] [name]           Date of Birth: 1940-12-13           MRN: [id number] Visit Date: 03/27/2018              Requested by: Curlene Labrum, MD [place], [place] [id number] PCP: Curlene Labrum, MD   Assessment & Plan: Visit Diagnoses:  1. Chronic pain of right knee   2. Primary osteoarthritis of right shoulder     Plan: Patient is lost weight and now would like to proceed with right total knee arthroplasty.  She has had maximal conservative treatment including use of a cane, walker at times, anti-inflammatories, intra-articular injections without relief.  Questions elicited and answered she understands request we proceed.  Follow-Up Instructions: No follow-ups on file.   Orders:  Orders Placed This Encounter  Procedures  . XR KNEE 3 VIEW RIGHT   No orders of the defined types were placed in this encounter.     Procedures: No procedures performed   Clinical Data: No additional findings.   Subjective: Chief Complaint  Patient presents with  . Right Knee - Pain    HPI 78 year old female returns with progressive right knee osteoarthritis with progression of point where now she is having trouble ambulating.  She had left TKA done 2004 in the last few years has had some increased intermittent swelling of her left knee but not constant.  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  Pain with weightbearing she is used a cane.  She is used Tylenol anti-inflammatories and previous intra-articular injections.  Review of Systems left total knee arthroplasty Dr. Lorre Nick 2004.  Possible hypertension, past history of morbid obesity now BMI 38 with dieting, history of CA PE, kidney disease, bradycardia, dysphasia and anxiety.  14 point systems  otherwise negative as pertains HPI.   Objective: Vital Signs: BP (!) 169/97   Pulse 88   Ht 5\' 3"  (1.6 m)   Wt 219 lb (99.3 kg)   BMI 38.79 kg/m   Physical Exam Constitutional:      Appearance: She is well-developed.  HENT:     Head: Normocephalic.     Right Ear: External ear normal.     Left Ear: External ear normal.  Eyes:     Pupils: Pupils are equal, round, and reactive to light.  Neck:     Thyroid: No thyromegaly.     Trachea: No tracheal deviation.  Cardiovascular:     Rate and Rhythm: Normal rate.  Pulmonary:     Effort: Pulmonary effort is normal.  Abdominal:     Palpations: Abdomen is soft.  Skin:    General: Skin is warm and dry.  Neurological:     Mental Status: She is alert and oriented to person, place, and time.  Psychiatric:        Behavior: Behavior normal.     Ortho Exam healed arthroscopic portals from shoulder arthroscopy right and left.  She can get her arms above her head right and left.  She has severe crepitus with right knee range of motion palpable osteophytes.  Distal pulses palpable.  No plantar foot lesions no venous stasis changes.  Specialty Comments:  No specialty  comments available.  Imaging: No results found.   PMFS History: Patient Active Problem List   Diagnosis Date Noted  . History of colonic polyps 08/14/2017  . Choledocholithiasis   . Abnormal CT scan, esophagus   . Cholestasis 02/05/2017  . Depression 02/05/2017  . Hypomagnesemia 02/05/2017  . Bradycardia 02/05/2017  . Transaminitis 07/08/2016  . Constipation 07/08/2016  . GIB (gastrointestinal bleeding) 07/08/2016  . HCAP (healthcare-associated pneumonia) 09/23/2015  . AKI (acute kidney injury) ([place]) 07/21/2015  . Hypokalemia 07/21/2015  . CAP (community acquired pneumonia) 07/14/2015  . Upper abdominal pain 06/10/2014  . Folliculitis of perineum 02/24/2014  . Giant comedone 12/21/2013  . 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 Acrochordon  11/25/2013  . Dysphagia 10/29/2013  . Morbid obesity ([place]) 10/02/2013  . Hypertension   . GERD (gastroesophageal reflux disease)   . Anxiety   . Anemia of chronic disease   . 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 Anemia   . Anxiety   . Arthritis   . Barrett's esophagus    seen on 2016 egd at [facility]  . Depression   . GERD (gastroesophageal reflux disease)   . H/O hiatal hernia   . Hypertension   . Obesity   . Pneumonia   . PONV (postoperative nausea and vomiting)   . Seasonal allergies   . Sepsis ([place]) 10/02/2013    Family History  Problem Relation Age of Onset  . Colon cancer Brother        IN HIS 60s-METASTATIC  . Colon polyps Paternal Grandfather   . Breast cancer Mother   . Hypertension Father   . Heart disease Father   . Hypertension Brother   . Heart disease Brother     Past Surgical History:  Procedure Laterality Date  . ABDOMINAL HYSTERECTOMY    . ABDOMINAL SURGERY    . APPENDECTOMY    . BACK SURGERY     lumbar x2 by Dr Arnoldo Morale  . CARPAL TUNNEL RELEASE Right 03/2014   Dr. Lorin Mercy  . CATARACT EXTRACTION W/PHACO Left 08/06/2013   Procedure: CATARACT EXTRACTION PHACO AND INTRAOCULAR LENS PLACEMENT LEFT EYE;  Surgeon: Tonny Branch, MD;  Location: [facility];  Service: Ophthalmology;  Laterality: Left;  CDE: 9.55  . CATARACT EXTRACTION W/PHACO Right 08/24/2013   Procedure: CATARACT EXTRACTION PHACO AND INTRAOCULAR LENS PLACEMENT (IOC);  Surgeon: Tonny Branch, MD;  Location: [facility];  Service: Ophthalmology;  Laterality: Right;  CDE:8.30  . CERVICAL FUSION    . CHOLECYSTECTOMY    . COLONOSCOPY N/A 11/03/2013   SLF: 1. Normal mucosa in the terminal ileum. 2. 13 colon polyps removed. 3. Moderate diverticulosis in the descending colon and sigmoid colon 4. the left colon is redundant 5. Small internal  hemorrhoids.  . COLONOSCOPY N/A 09/25/2017   Procedure: COLONOSCOPY;  Surgeon: Rogene Houston, MD;  [facility];  Service: Endoscopy;  Laterality: N/A;  1:55  . ESOPHAGOGASTRODUODENOSCOPY N/A 11/03/2013   SLF: 1. 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 Mild Non-erosive gastritis  . 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 s/p gastric bypass  . EYE SURGERY    . GASTRIC BYPASS  1979   revision in 1980   . HIATAL HERNIA REPAIR     [facility] in 2011  . JOINT REPLACEMENT Right 1995  hip  . JOINT REPLACEMENT Left 2008   hip  . MEDIAL PARTIAL KNEE REPLACEMENT Left    Dr Ronnie Derby  . PARAESOPHAGEAL HERNIA REPAIR  SEP 2010 DR. MCNATT  . POLYPECTOMY  09/25/2017   Procedure: POLYPECTOMY;  Surgeon: Rogene Houston, MD;  [facility];  Service: Endoscopy;;  colon  . SHOULDER ARTHROSCOPY Right   . SHOULDER ARTHROSCOPY Right    x2  . SKIN BIOPSY    . TONSILLECTOMY    . TOTAL SHOULDER ARTHROPLASTY Right 02/16/2013   Procedure: TOTAL SHOULDER ARTHROPLASTY;  Surgeon: Marybelle Killings, MD;  Location: [facility];  Service: Orthopedics;  Laterality: Right;  Right Total Shoulder Arthroplasty, Cemented   Social History   Occupational History  . Not on file  Tobacco Use  . Smoking status: Never Smoker  . Smokeless tobacco: Never Used  Substance and Sexual Activity  . Alcohol use: No  . Drug use: No  . Sexual activity: Yes    Birth control/protection: Surgical

## 2018-03-28 ENCOUNTER — Encounter (INDEPENDENT_AMBULATORY_CARE_PROVIDER_SITE_OTHER): Payer: Self-pay | Admitting: Orthopaedic Surgery

## 2018-04-29 ENCOUNTER — Ambulatory Visit (INDEPENDENT_AMBULATORY_CARE_PROVIDER_SITE_OTHER): Payer: Medicare Other | Admitting: Internal Medicine

## 2018-05-08 ENCOUNTER — Ambulatory Visit (INDEPENDENT_AMBULATORY_CARE_PROVIDER_SITE_OTHER): Payer: Medicare Other | Admitting: Internal Medicine

## 2018-05-08 ENCOUNTER — Encounter (INDEPENDENT_AMBULATORY_CARE_PROVIDER_SITE_OTHER): Payer: Self-pay | Admitting: Internal Medicine

## 2018-05-08 VITALS — BP 155/84 | HR 93 | Temp 98.7°F | Ht 63.0 in | Wt 226.3 lb

## 2018-05-08 DIAGNOSIS — K5900 Constipation, unspecified: Secondary | ICD-10-CM | POA: Diagnosis not present

## 2018-05-08 DIAGNOSIS — K219 Gastro-esophageal reflux disease without esophagitis: Secondary | ICD-10-CM | POA: Diagnosis not present

## 2018-05-08 NOTE — Patient Instructions (Signed)
OV in 1 year.  

## 2018-05-08 NOTE — Progress Notes (Signed)
Subjective:    Patient ID: [name] [name], female    DOB: 07-06-1940, 78 y.o.   MRN: [id number]  HPI Here today for f/u. Last seen in June of 2019. She underwent a colonoscopy in 2019 (Personal hx of colonic polyps. Family hx of colon cancer). Seven small polyps in the sigmoid colon, splenic flexure, transverse colon and ascending colon.  Two samll polyps in sigmoid and ascending colon.  On 12 mm polyp in the distal sigmoid colon. Biopsy: Most of the polyps tubular adenoma.   Recently had pneumonia and was covered with Levaquin.  She tells me she is doing okay.  She says she has lower abdominal pain off and on. She has constipation. She may have a BM every other day. And sometimes she may only have a BM x 1 a weeks.  Her appetite is okay. She says nothing taste right since she had the pneumonia. She wants to have a knee replacement but wants to feel better. She denies and GERD. She watches what she eats.  Review of Systems Past Medical History:  Diagnosis Date  . Anemia   . Anxiety   . Arthritis   . Barrett's esophagus    seen on 2016 [facility]d [facility]  . Depression   . GERD (gastroesophageal reflux disease)   . H/O hiatal hernia   . Hypertension   . Obesity   . Pneumonia   . PONV (postoperative nausea and vomiting)   . Seasonal allergies   . Sepsis ([place]) 10/02/2013    Past Surgical History:  Procedure Laterality Date  . ABDOMINAL HYSTERECTOMY    . ABDOMINAL SURGERY    . APPENDECTOMY    . BACK SURGERY     lumbar x2 by Dr Arnoldo Morale  . CARPAL TUNNEL RELEASE Right 03/2014   Dr. Lorin Mercy  . CATARACT EXTRACTION W/PHACO Left 08/06/2013   Procedure: CATARACT EXTRACTION PHACO AND INTRAOCULAR LENS PLACEMENT LEFT EYE;  Surgeon: Tonny Branch, MD;  Location[facility];  Service: Ophthalmology;  Laterality: Left;  CDE: 9.55  . CATARACT EXTRACTION W/PHACO Right 08/24/2013   Procedure: CATARACT EXTRACTION PHACO AND INTRAOCULAR LENS PLACEMENT (IOC);  Surgeon: Tonny Branch, MD;  Location: [facility];  Service:  Ophthalmology;  Laterality: Right;  CDE:8.30  . CERVICAL FUSION    . CHOLECYSTECTOMY    . COLONOSCOPY N/A 11/03/2013   SLF: 1. Normal mucosa in the terminal ileum. 2. 13 colon polyps removed. 3. Moderate diverticulosis in the descending colon and sigmoid colon 4. the left colon is redundant 5. Small internal  hemorrhoids.  . COLONOSCOPY N/A 09/25/2017   Procedure: COLONOSCOPY;  Surgeon: Rogene Houston, MD;  [facility];  Service: Endoscopy;  Laterality: N/A;  1:55  . ESOPHAGOGASTRODUODENOSCOPY N/A 11/03/2013   SLF: 1. 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 Mild Non-erosive gastritis  . 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 s/p gastric bypass  . EYE SURGERY    . GASTRIC BYPASS  1979   revision in 1980   . HIATAL HERNIA REPAIR     [facility] in 2011  . JOINT REPLACEMENT Right 1995   hip  . JOINT REPLACEMENT Left 2008   hip  . MEDIAL PARTIAL KNEE REPLACEMENT Left    Dr Ronnie Derby  . PARAESOPHAGEAL HERNIA REPAIR  SEP 2010 DR. MCNATT  . POLYPECTOMY  09/25/2017   Procedure: POLYPECTOMY;  Surgeon: Laural Golden,  Mechele Dawley, MD;  [facility];  Service: Endoscopy;;  colon  . SHOULDER ARTHROSCOPY Right   . SHOULDER ARTHROSCOPY Right    x2  . SKIN BIOPSY    . TONSILLECTOMY    . TOTAL SHOULDER ARTHROPLASTY Right 02/16/2013   Procedure: TOTAL SHOULDER ARTHROPLASTY;  Surgeon: Marybelle Killings, MD;  Location: [facility];  Service: Orthopedics;  Laterality: Right;  Right Total Shoulder Arthroplasty, Cemented    Allergies  Allergen Reactions  . Novocain [Procaine Hcl]     Unknown-passed out with novocaine injected for dental surgery.  . Other Hives    EKG pads - need to use pediatric pads  . Latex Rash  . Penicillins Rash    Has patient had a PCN reaction causing immediate rash, facial/tongue/throat  swelling, SOB or lightheadedness with hypotension: Yes Has patient had a PCN reaction causing severe rash involving mucus membranes or skin necrosis: No Has patient had a PCN reaction that required hospitalization No Has patient had a PCN reaction occurring within the last 10 years: No  If all of the above answers are "NO", then may proceed with Cephalosporin use.   . Sulfa Antibiotics Rash    Current Outpatient Medications on File Prior to Visit  Medication Sig Dispense Refill  . acetaminophen (TYLENOL) 500 MG tablet Take 1,000 mg by mouth every 6 (six) hours as needed for mild pain or headache.    . albuterol (PROVENTIL) (2.5 MG/3ML) 0.083% nebulizer solution Take 3 mLs (2.5 mg total) by nebulization every 4 (four) hours as needed for wheezing. (Patient taking differently: Take 2.5 mg by nebulization daily as needed for wheezing. ) 75 mL 12  . Biotin w/ Vitamins C & E (HAIR/SKIN/NAILS PO) Take 1 tablet by mouth 3 (three) times daily.     . Calcium Citrate-Vitamin D (CITRACAL PETITES/VITAMIN D) 200-250 MG-UNIT TABS Take 1 tablet by mouth daily.    . Cholecalciferol (VITAMIN D3) 125 MCG (5000 UT) TABS Take 2,000 Units by mouth.    . dextromethorphan-guaiFENesin (MUCINEX DM) 30-600 MG 12hr tablet Take 1 tablet by mouth 2 (two) times daily as needed for cough.     . docusate sodium (COLACE) 100 MG capsule Take 100 mg by mouth at bedtime as needed for mild constipation.     Marland Kitchen GLUCOSAMINE SULFATE-MSM EX Apply 1 application topically 3 (three) times daily as needed (pain). Roll on knee to relieve pain    . Lactobacillus (FLORAJEN ACIDOPHILUS PO) Take 1 capsule by mouth every morning.     Marland Kitchen omeprazole (PRILOSEC) 20 MG capsule Take 20 mg by mouth 2 (two) times daily before a meal.    . ondansetron (ZOFRAN) 4 MG tablet Take 4 mg by mouth every 8 (eight) hours as needed for nausea or vomiting.    . potassium chloride (K-DUR) 10 MEQ tablet Take 10 mEq by mouth 3 (three) times daily with meals.     .  sertraline (ZOLOFT) 100 MG tablet Take 100 mg by mouth daily.    . traMADol (ULTRAM) 50 MG tablet Take 1 tablet (50 mg total) by mouth every 6 (six) hours as needed. 15 tablet 0  . traZODone (DESYREL) 50 MG tablet Take 50 mg by mouth at bedtime.    . vitamin B-12 (CYANOCOBALAMIN) 100 MCG tablet Take 100 mcg by mouth 2 (two) times a week.      No current facility-administered medications on file prior to visit.         Objective:   Physical Exam Blood  pressure (!) 155/84, pulse 93, temperature 98.7 F (37.1 C), height 5\' 3"  (1.6 m), weight 226 lb 4.8 oz (102.6 kg). Alert and oriented. Skin warm and dry. Oral mucosa is moist.   . Sclera anicteric, conjunctivae is pink. Thyroid not enlarged. No cervical lymphadenopathy. Lungs clear. Heart regular rate and rhythm.  Abdomen is soft. Bowel sounds are positive. No hepatomegaly. No abdominal masses felt. No tenderness.  No edema to lower extremities.           Assessment & Plan:  GERD. She will continue the Omeprazole.  She will continue the Colace for her constipation. She will have OV in 1 year.

## 2018-05-15 ENCOUNTER — Other Ambulatory Visit: Payer: Self-pay

## 2018-05-15 ENCOUNTER — Telehealth (INDEPENDENT_AMBULATORY_CARE_PROVIDER_SITE_OTHER): Payer: Self-pay | Admitting: Orthopaedic Surgery

## 2018-05-15 ENCOUNTER — Encounter (INDEPENDENT_AMBULATORY_CARE_PROVIDER_SITE_OTHER): Payer: Self-pay | Admitting: Surgery

## 2018-05-15 ENCOUNTER — Ambulatory Visit (INDEPENDENT_AMBULATORY_CARE_PROVIDER_SITE_OTHER): Payer: Medicare Other | Admitting: Surgery

## 2018-05-15 VITALS — BP 144/77 | HR 81 | Ht 63.0 in | Wt 224.0 lb

## 2018-05-15 DIAGNOSIS — M1711 Unilateral primary osteoarthritis, right knee: Secondary | ICD-10-CM

## 2018-05-15 NOTE — Telephone Encounter (Signed)
Patient came in for H&P to see Mack Guise, PA, but exam was not completed as clearance remains questionable due to patient's recent diagnosis of pneumonia.   Patient has also been treated in the ED at [facility] and prescribed 10 days of Levaquin. Patient's right total knee is set for 05-30-18 @[facility] .  Request for medical clearance was faxed to Dr. Judd Lien in [place] on 04-03-18.  Patient was cleared, however patient conveyed that she has been on two rounds of antibiotics recently (since the date of her clearance from her PCP).  I called office and spoke with Olivia Mackie.  She confirmed that patient was put on Z-pak and Prednisone on 05-07-18.  According to the PCP office she was not scheduled to follow up for a couple of months. In order to proceed with surgery patient will need an updated clearance. An appointment was made for this coming Monday March 16th at 11:30am. Patient is aware of appointment date & time.  She is also agreeable to being rescheduled for a later date if she has not completely recovered from pneumonia.  A new clearance request for surgery has been faxed to Dr. Pleas Koch.

## 2018-05-15 NOTE — Progress Notes (Signed)
Patient came in for preop appointment today for total knee replacement.  States that she is currently undergoing treatment for pneumonia.  She had been prescribed 10 days of Levaquin by the emergency room.  After completing that course of treatment she still continue to be symptomatic with fever chills cough.  She was seen by her primary care physician who recently started 3-day course of azithromycin.  Patient has also been on prednisone.  She was previously cleared by primary care physician January 2020 but we do not have official clearance after being treated for pneumonia.  Patient also does not have upcoming appointment with PCP before schedule surgery March 27.  Advised patient that we will assist her in getting an appointment for follow-up with primary care physician for medical clearance.  I held off on doing history and physical today and will have patient return after we have received appropriate clearance.  All questions answered.

## 2018-05-30 ENCOUNTER — Ambulatory Visit (HOSPITAL_COMMUNITY): Admission: RE | Admit: 2018-05-30 | Payer: Medicare Other | Source: Home / Self Care | Admitting: Orthopaedic Surgery

## 2018-05-30 ENCOUNTER — Encounter (HOSPITAL_COMMUNITY): Admission: RE | Payer: Self-pay | Source: Home / Self Care

## 2018-05-30 SURGERY — ARTHROPLASTY, KNEE, TOTAL
Anesthesia: General | Laterality: Right

## 2018-08-14 ENCOUNTER — Ambulatory Visit (INDEPENDENT_AMBULATORY_CARE_PROVIDER_SITE_OTHER): Payer: Medicare Other | Admitting: Orthopaedic Surgery

## 2018-08-14 ENCOUNTER — Telehealth: Payer: Self-pay | Admitting: Radiology

## 2018-08-14 ENCOUNTER — Encounter: Payer: Self-pay | Admitting: Orthopaedic Surgery

## 2018-08-14 VITALS — BP 174/88 | HR 79 | Ht 63.0 in | Wt 225.0 lb

## 2018-08-14 DIAGNOSIS — M19011 Primary osteoarthritis, right shoulder: Secondary | ICD-10-CM

## 2018-08-14 DIAGNOSIS — M1711 Unilateral primary osteoarthritis, right knee: Secondary | ICD-10-CM

## 2018-08-14 DIAGNOSIS — M19012 Primary osteoarthritis, left shoulder: Secondary | ICD-10-CM | POA: Diagnosis not present

## 2018-08-14 DIAGNOSIS — Z96611 Presence of right artificial shoulder joint: Secondary | ICD-10-CM | POA: Insufficient documentation

## 2018-08-14 MED ORDER — LIDOCAINE HCL 1 % IJ SOLN
0.5000 mL | INTRAMUSCULAR | Status: AC | PRN
  Administered 2018-08-14: .5 mL

## 2018-08-14 MED ORDER — BUPIVACAINE HCL 0.25 % IJ SOLN
4.0000 mL | INTRAMUSCULAR | Status: AC | PRN
  Administered 2018-08-14: 4 mL via INTRA_ARTICULAR

## 2018-08-14 MED ORDER — METHYLPREDNISOLONE ACETATE 40 MG/ML IJ SUSP
40.0000 mg | INTRAMUSCULAR | Status: AC | PRN
  Administered 2018-08-14: 40 mg via INTRA_ARTICULAR

## 2018-08-14 NOTE — Progress Notes (Signed)
Office Visit Note   Patient: [name] [name]           Date of Birth: April 28, 1940           MRN: [id number] Visit Date: 08/14/2018              Requested by: Curlene Labrum, MD [place],  [place] [address] PCP: Curlene Labrum, MD   Assessment & Plan: Visit Diagnoses:  1. Primary osteoarthritis of right shoulder   2. Unilateral primary osteoarthritis, right knee   3. Primary osteoarthritis, left shoulder   4. History of arthroplasty of right shoulder   2.    Impingement left shoulder.  Plan: Bilateral shoulder injection performed.  She wants to proceed with rescheduling right total knee arthroplasty.  Follow-Up Instructions: Return if symptoms worsen or fail to improve.   Orders:  Orders Placed This Encounter  Procedures  . Large Joint Inj: R subacromial bursa  . Large Joint Inj: L subacromial bursa   No orders of the defined types were placed in this encounter.     Procedures: Large Joint Inj: R subacromial bursa on 08/14/2018 2:41 PM Indications: pain Details: 22 G 1.5 in needle  Arthrogram: No  Medications: 4 mL bupivacaine 0.25 %; 40 mg methylPREDNISolone acetate 40 MG/ML; 0.5 mL lidocaine 1 % Outcome: tolerated well, no immediate complications Procedure, treatment alternatives, risks and benefits explained, specific risks discussed. Consent was given by the patient. Immediately prior to procedure a time out was called to verify the correct patient, procedure, equipment, support staff and site/side marked as required. Patient was prepped and draped in the usual sterile fashion.   Large Joint Inj: L subacromial bursa on 08/14/2018 2:41 PM Indications: pain Details: 22 G 1.5 in needle  Arthrogram: No  Medications: 4 mL bupivacaine 0.25 %; 40 mg methylPREDNISolone acetate 40 MG/ML; 0.5 mL lidocaine 1 % Outcome: tolerated well, no immediate complications Procedure, treatment alternatives, risks and benefits explained, specific risks discussed. Consent was  given by the patient. Immediately prior to procedure a time out was called to verify the correct patient, procedure, equipment, support staff and site/side marked as required. Patient was prepped and draped in the usual sterile fashion.       Clinical Data: No additional findings.   Subjective: Chief Complaint  Patient presents with  . Right Shoulder - Pain  . Left Shoulder - Pain  A bit said that quoted at 39.8 movement made but I mean she lost 20 when I asked her to lose before severe  HPI 78 year old female returns with bilateral shoulder pain.  She was scheduled for total knee arthroplasty in March which was canceled due to the coronavirus and she is waiting to hear on rescheduling.  Previous injection in her right shoulder was last year in June but did well after the injection.  Patient has gained some weight and is now at the cutoff line at BMI 40 she states she can lose a few pounds without problems.  She has been staying in her house to avoid contact due to the coronavirus.  Review of Systems previous left total knee arthroplasty by Dr. Lorre Nick 2004.  Positive hypertension, obesity.  History of pulmonary embolism, kidney disease, bradycardia, dysphasia, anxiety, previous right total shoulder arthroplasty 2014.   Objective: Vital Signs: BP (!) 174/88   Pulse 79   Ht 5\' 3"  (1.6 m)   Wt 225 lb (102.1 kg)   BMI 39.86 kg/m   Physical Exam Constitutional:  Appearance: She is well-developed.  HENT:     Head: Normocephalic.     Right Ear: External ear normal.     Left Ear: External ear normal.  Eyes:     Pupils: Pupils are equal, round, and reactive to light.  Neck:     Thyroid: No thyromegaly.     Trachea: No tracheal deviation.  Cardiovascular:     Rate and Rhythm: Normal rate.  Pulmonary:     Effort: Pulmonary effort is normal.  Abdominal:     Palpations: Abdomen is soft.  Skin:    General: Skin is warm and dry.  Neurological:     Mental Status: She is alert and  oriented to person, place, and time.  Psychiatric:        Behavior: Behavior normal.     Ortho Exam right knee crepitus with range of motion.  Healed arthroscopic portals right and left shoulder.  Crepitus with right knee range of motion severe.  Well-healed left total knee arthroplasty with good ligamentous balance and full extension.  Specialty Comments:  No specialty comments available.  Imaging: No results found.   PMFS History: Patient Active Problem List   Diagnosis Date Noted  . Unilateral primary osteoarthritis, right knee 08/14/2018  . Primary osteoarthritis, 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  . History of arthroplasty of right shoulder 08/14/2018  . History of colonic polyps 08/14/2017  . Choledocholithiasis   . Abnormal CT scan, esophagus   . Cholestasis 02/05/2017  . Depression 02/05/2017  . Hypomagnesemia 02/05/2017  . Bradycardia 02/05/2017  . Transaminitis 07/08/2016  . Constipation 07/08/2016  . GIB (gastrointestinal bleeding) 07/08/2016  . HCAP (healthcare-associated pneumonia) 09/23/2015  . AKI (acute kidney injury) ([place]) 07/21/2015  . Hypokalemia 07/21/2015  . CAP (community acquired pneumonia) 07/14/2015  . Upper abdominal pain 06/10/2014  . Folliculitis of perineum 02/24/2014  . Giant comedone 12/21/2013  . 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 Acrochordon 11/25/2013  . Dysphagia 10/29/2013  . Class 2 severe obesity due to excess calories with serious comorbidity and body mass index (BMI) of 38.0 to 38.9 in adult ([facility]) 10/02/2013  . Hypertension   . GERD (gastroesophageal reflux disease)   . Anxiety   . Anemia of chronic disease    Past Medical History:  Diagnosis Date  . Anemia   . Anxiety   . Arthritis   . Barrett's esophagus    seen on 2016 egd at [facility]  . Depression   . GERD (gastroesophageal reflux disease)   . H/O hiatal hernia   . Hypertension   . Obesity   . Pneumonia   . PONV (postoperative nausea and vomiting)   .  Seasonal allergies   . Sepsis ([place]) 10/02/2013    Family History  Problem Relation Age of Onset  . Colon cancer Brother        IN HIS 60s-METASTATIC  . Colon polyps Paternal Grandfather   . Breast cancer Mother   . Hypertension Father   . Heart disease Father   . Hypertension Brother   . Heart disease Brother     Past Surgical History:  Procedure Laterality Date  . ABDOMINAL HYSTERECTOMY    . ABDOMINAL SURGERY    . APPENDECTOMY    . BACK SURGERY     lumbar x2 by Dr Arnoldo Morale  . CARPAL TUNNEL RELEASE Right 03/2014   Dr. Lorin Mercy  . CATARACT EXTRACTION W/PHACO Left 08/06/2013   Procedure: CATARACT EXTRACTION PHACO AND INTRAOCULAR LENS PLACEMENT LEFT EYE;  Surgeon: Tonny Branch, MD;  Location: [facility];  Service: Ophthalmology;  Laterality: Left;  CDE: 9.55  . CATARACT EXTRACTION W/PHACO Right 08/24/2013   Procedure: CATARACT EXTRACTION PHACO AND INTRAOCULAR LENS PLACEMENT ([facility]);  Surgeon: Tonny Branch, MD;  Location: [facility];  Service: Ophthalmology;  Laterality: Right;  CDE:8.30  . CERVICAL FUSION    . CHOLECYSTECTOMY    . COLONOSCOPY N/A 11/03/2013   SLF: 1. Normal mucosa in the terminal ileum. 2. 13 colon polyps removed. 3. Moderate diverticulosis in the descending colon and sigmoid colon 4. the left colon is redundant 5. Small internal  hemorrhoids.  . COLONOSCOPY N/A 09/25/2017   Procedure: COLONOSCOPY;  Surgeon: Rogene [place], MD;  Location: [facility];  Service: Endoscopy;  Laterality: N/A;  1:55  . ESOPHAGOGASTRODUODENOSCOPY N/A 11/03/2013   SLF: 1. 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 Mild Non-erosive gastritis  . 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 s/p gastric bypass  . EYE SURGERY    . GASTRIC BYPASS  1979   revision in 1980   . HIATAL HERNIA REPAIR     [facility] in 2011  . JOINT  REPLACEMENT Right 1995   hip  . JOINT REPLACEMENT Left 2008   hip  . MEDIAL PARTIAL KNEE REPLACEMENT Left    Dr Ronnie Derby  . PARAESOPHAGEAL HERNIA REPAIR  SEP 2010 DR. MCNATT  . POLYPECTOMY  09/25/2017   Procedure: POLYPECTOMY;  Surgeon: Rogene [place], MD;  Location: [facility];  Service: Endoscopy;;  colon  . SHOULDER ARTHROSCOPY Right   . SHOULDER ARTHROSCOPY Right    x2  . SKIN BIOPSY    . TONSILLECTOMY    . TOTAL SHOULDER ARTHROPLASTY Right 02/16/2013   Procedure: TOTAL SHOULDER ARTHROPLASTY;  Surgeon: Marybelle Killings, MD;  Location: [facility];  Service: Orthopedics;  Laterality: Right;  Right Total Shoulder Arthroplasty, Cemented   Social History   Occupational History  . Not on file  Tobacco Use  . Smoking status: Never Smoker  . Smokeless tobacco: Never Used  Substance and Sexual Activity  . Alcohol use: No  . Drug use: No  . Sexual activity: Yes    Birth control/protection: Surgical

## 2018-08-14 NOTE — Telephone Encounter (Signed)
Patient is being seen in the [place] office today for shoulder pain. While talking with her, she states that she has not heard anything about rescheduling her total knee replacement. I explained that we were working on getting patients scheduled for surgery who had been cancelled due to COVID-19. Patient is completely understanding. She would like call to discuss surgery scheduling.

## 2018-08-25 ENCOUNTER — Other Ambulatory Visit: Payer: Self-pay

## 2018-08-25 ENCOUNTER — Ambulatory Visit (INDEPENDENT_AMBULATORY_CARE_PROVIDER_SITE_OTHER): Payer: Medicare Other | Admitting: Otolaryngology

## 2018-08-25 DIAGNOSIS — H9209 Otalgia, unspecified ear: Secondary | ICD-10-CM | POA: Diagnosis not present

## 2018-08-25 DIAGNOSIS — R070 Pain in throat: Secondary | ICD-10-CM

## 2018-08-28 ENCOUNTER — Encounter: Payer: Self-pay | Admitting: Surgery

## 2018-08-28 ENCOUNTER — Other Ambulatory Visit: Payer: Self-pay

## 2018-08-28 ENCOUNTER — Ambulatory Visit (INDEPENDENT_AMBULATORY_CARE_PROVIDER_SITE_OTHER): Payer: Medicare Other | Admitting: Surgery

## 2018-08-28 VITALS — BP 183/96 | HR 83 | Ht 63.0 in | Wt 225.0 lb

## 2018-08-28 DIAGNOSIS — M1711 Unilateral primary osteoarthritis, right knee: Secondary | ICD-10-CM

## 2018-08-28 NOTE — Progress Notes (Signed)
77 year old white female history of end-stage DJD right knee comes in for preop evaluation.  Knee symptoms unchanged from previous visit.  She is want to proceed with total knee replacement as scheduled.  I am awaiting preop medical clearance.  Patient states that this has been done already.  Patient admits to having some cough and some wheezing but this has been controlled when she uses her nebulizer.  No complaints of chest pain.  Some constipation which is chronic.  Patient states that she lives alone and will likely be needing rehab versus skilled nurse facility placement.  Today history and physical performed.

## 2018-09-01 NOTE — Progress Notes (Signed)
[place], [place] [address] Phone: [phone] Fax: [phone]      Your procedure is scheduled on July 6th.  Report to [facility] Main Entrance "A" at 10:30 A.M., and check in at the Admitting office.  Call this number if you have problems the morning of surgery:  [phone]  Call [phone] if you have any questions prior to your surgery date Monday-Friday 8am-4pm    Remember:  Do not eat or drink after midnight.  You may drink clear liquids until 9:30 .   Clear liquids allowed are: Water, Non-Citrus Juices (without pulp), Carbonated Beverages, Clear Tea, Black Coffee Only, and Gatorade  Please complete your PRE-SURGERY ENSURE that was provided to you 3 hours prior to you surgery start time.  Please, if able, drink it in one setting. DO NOT SIP.   Take these medicines the morning of surgery with A SIP OF WATER   Tylenol - if needed  Albuterol Nebulizer - if needed  Omeprazole (Prilosec)  Zofran - if needed  Sertraline (Zoloft)  7 days prior to surgery STOP taking any Aspirin (unless otherwise instructed by your surgeon), Aleve, Naproxen, Ibuprofen, Motrin, Advil, Goody's, BC's, all herbal medications, fish oil, and all vitamins.    The Morning of Surgery  Do not wear jewelry, make-up or nail polish.  Do not wear lotions, powders, or perfumes, or deodorant  Do not shave 48 hours prior to surgery.    Do not bring valuables to the hospital.  [facility] is not responsible for any belongings or valuables.  If you are a smoker, DO NOT Smoke 24 hours prior to surgery IF you wear a CPAP at night please bring your mask, tubing, and machine the morning of surgery   Remember that you must have someone to transport you home after your surgery, and remain with you for 24 hours if you are discharged the same day.   Contacts, glasses, hearing aids, dentures or bridgework may not be worn into surgery.    Leave your  suitcase in the car.  After surgery it may be brought to your room.  For patients admitted to the hospital, discharge time will be determined by your treatment team.  Patients discharged the day of surgery will not be allowed to drive home.    Special instructions:   [facility]- Preparing For Surgery  Before surgery, you can play an important role. Because skin is not sterile, your skin needs to be as free of germs as possible. You can reduce the number of germs on your skin by washing with CHG (chlorahexidine gluconate) Soap before surgery.  CHG is an antiseptic cleaner which kills germs and bonds with the skin to continue killing germs even after washing.    Oral Hygiene is also important to reduce your risk of infection.  Remember - BRUSH YOUR TEETH THE MORNING OF SURGERY WITH YOUR REGULAR TOOTHPASTE  Please do not use if you have an allergy to CHG or antibacterial soaps. If your skin becomes reddened/irritated stop using the CHG.  Do not shave (including legs and underarms) for at least 48 hours prior to first CHG shower. It is OK to shave your face.  Please follow these instructions carefully.   1. Shower the NIGHT BEFORE SURGERY and the MORNING OF SURGERY with CHG Soap.   2. If you chose to wash your hair, wash your hair first as usual with your normal shampoo.  3.  After you shampoo, rinse your hair and body thoroughly to remove the shampoo.  4. Use CHG as you would any other liquid soap. You can apply CHG directly to the skin and wash gently with a scrungie or a clean washcloth.   5. Apply the CHG Soap to your body ONLY FROM THE NECK DOWN.  Do not use on open wounds or open sores. Avoid contact with your eyes, ears, mouth and genitals (private parts). Wash Face and genitals (private parts)  with your normal soap.   6. Wash thoroughly, paying special attention to the area where your surgery will be performed.  7. Thoroughly rinse your body with warm water from the neck  down.  8. DO NOT shower/wash with your normal soap after using and rinsing off the CHG Soap.  9. Pat yourself dry with a CLEAN TOWEL.  10. Wear CLEAN PAJAMAS to bed the night before surgery, wear comfortable clothes the morning of surgery  11. Place CLEAN SHEETS on your bed the night of your first shower and DO NOT SLEEP WITH PETS.   Day of Surgery:  Do not apply any deodorants/lotions.  Please wear clean clothes to the hospital/surgery center.   Remember to brush your teeth WITH YOUR REGULAR TOOTHPASTE.   Please read over the following fact sheets that you were given.

## 2018-09-02 ENCOUNTER — Encounter (HOSPITAL_COMMUNITY)
Admission: RE | Admit: 2018-09-02 | Discharge: 2018-09-02 | Disposition: A | Discharge status: Home / Self Care | Payer: Medicare Other | Source: Ambulatory Visit | Attending: Surgery | Admitting: Surgery

## 2018-09-02 ENCOUNTER — Encounter (HOSPITAL_COMMUNITY)
Admission: RE | Admit: 2018-09-02 | Discharge: 2018-09-02 | Disposition: A | Discharge status: Home / Self Care | Payer: Medicare Other | Source: Ambulatory Visit | Attending: Orthopaedic Surgery | Admitting: Orthopaedic Surgery

## 2018-09-02 ENCOUNTER — Encounter (HOSPITAL_COMMUNITY): Payer: Self-pay

## 2018-09-02 ENCOUNTER — Other Ambulatory Visit: Payer: Self-pay

## 2018-09-02 DIAGNOSIS — Z1159 Encounter for screening for other viral diseases: Secondary | ICD-10-CM | POA: Insufficient documentation

## 2018-09-02 DIAGNOSIS — Z01818 Encounter for other preprocedural examination: Secondary | ICD-10-CM | POA: Insufficient documentation

## 2018-09-02 LAB — COMPREHENSIVE METABOLIC PANEL
ALT: 17 U/L (ref 0–44)
AST: 19 U/L (ref 15–41)
Albumin: 4 g/dL (ref 3.5–5.0)
Alkaline Phosphatase: 118 U/L (ref 38–126)
Anion gap: 9 (ref 5–15)
BUN: 18 mg/dL (ref 8–23)
CO2: 24 mmol/L (ref 22–32)
Calcium: 8.7 mg/dL — ABNORMAL LOW (ref 8.9–10.3)
Chloride: 108 mmol/L (ref 98–111)
Creatinine, Ser: 1.05 mg/dL — ABNORMAL HIGH (ref 0.44–1.00)
GFR calc Af Amer: 59 mL/min — ABNORMAL LOW (ref >60)
GFR calc non Af Amer: 51 mL/min — ABNORMAL LOW (ref >60)
Glucose, Bld: 129 mg/dL — ABNORMAL HIGH (ref 70–99)
Potassium: 3.4 mmol/L — ABNORMAL LOW (ref 3.5–5.1)
Sodium: 141 mmol/L (ref 135–145)
Total Bilirubin: 0.4 mg/dL (ref 0.3–1.2)
Total Protein: 6.6 g/dL (ref 6.5–8.1)

## 2018-09-02 LAB — URINALYSIS, ROUTINE W REFLEX MICROSCOPIC
Bilirubin Urine: NEGATIVE
Glucose, UA: NEGATIVE mg/dL
Hgb urine dipstick: NEGATIVE
Ketones, ur: NEGATIVE mg/dL
Leukocytes,Ua: NEGATIVE
Nitrite: NEGATIVE
Protein, ur: 30 mg/dL — AB
Specific Gravity, Urine: 1.025 (ref 1.005–1.030)
pH: 5 (ref 5.0–8.0)

## 2018-09-02 LAB — CBC
HCT: 39.7 % (ref 36.0–46.0)
Hemoglobin: 13.1 g/dL (ref 12.0–15.0)
MCH: 29.4 pg (ref 26.0–34.0)
MCHC: 33 g/dL (ref 30.0–36.0)
MCV: 89 fL (ref 80.0–100.0)
Platelets: 452 10*3/uL — ABNORMAL HIGH (ref 150–400)
RBC: 4.46 MIL/uL (ref 3.87–5.11)
RDW: 14.4 % (ref 11.5–15.5)
WBC: 5.7 10*3/uL (ref 4.0–10.5)
nRBC: 0 % (ref 0.0–0.2)

## 2018-09-02 LAB — SURGICAL PCR SCREEN
MRSA, PCR: NEGATIVE
Staphylococcus aureus: NEGATIVE

## 2018-09-02 NOTE — Progress Notes (Signed)
PCP - Judd Lien Cardiologist - denies  Chest x-ray - 09/02/18 EKG - 09/02/18 Stress Test - pt states one has been done in the last couple of months. Requested from [facility]   ECHO - Possibly done at the time of ST; will request from [facility]  Sleep Study - denies CPAP - denies  Blood Thinner Instructions:N/A Aspirin Instructions:N/A  Anesthesia review: Yes, requested cardiac records records  Patient denies shortness of breath, fever, cough and chest pain at PAT appointment   Patient verbalized understanding of instructions that were given to them at the PAT appointment. Patient was also instructed that they will need to review over the PAT instructions again at home before surgery.   Covid screening scheduled for Thursday, 09/04/18. Pt made aware of quarantining after screening. Pt verbalizes agreement.    Coronavirus Screening  Have you experienced the following symptoms:  Cough yes/no: No Fever (>100.92F)  yes/no: No Runny nose yes/no: No Sore throat yes/no: No Difficulty breathing/shortness of breath  yes/no: No  Have you or a family member traveled in the last 14 days and where? yes/no: No   If the patient indicates "YES" to the above questions, their PAT will be rescheduled to limit the exposure to others and, the surgeon will be notified. THE PATIENT WILL NEED TO BE ASYMPTOMATIC FOR 14 DAYS.   If the patient is not experiencing any of these symptoms, the PAT nurse will instruct them to NOT bring anyone with them to their appointment since they may have these symptoms or traveled as well.   Please remind your patients and families that hospital visitation restrictions are in effect and the importance of the restrictions.

## 2018-09-03 NOTE — Anesthesia Preprocedure Evaluation (Addendum)
Anesthesia Evaluation  Patient identified by MRN, date of birth, ID band Patient awake    Reviewed: Allergy & Precautions, H&P , NPO status , Patient's Chart, lab work & pertinent test results  History of Anesthesia Complications (+) PONV  Airway Mallampati: II  TM Distance: >3 FB Neck ROM: Full    Dental no notable dental hx. (+) Edentulous Upper, Edentulous Lower, Dental Advisory Given   Pulmonary neg pulmonary ROS,    Pulmonary exam normal breath sounds clear to auscultation       Cardiovascular hypertension,  Rhythm:Regular Rate:Normal     Neuro/Psych Anxiety Depression negative neurological ROS     GI/Hepatic Neg liver ROS, hiatal hernia, GERD  Medicated and Controlled,  Endo/Other  Morbid obesity  Renal/GU negative Renal ROS  negative genitourinary   Musculoskeletal  (+) Arthritis , Osteoarthritis,    Abdominal   Peds  Hematology  (+) Blood dyscrasia, anemia ,   Anesthesia Other Findings   Reproductive/Obstetrics negative OB ROS                           Anesthesia Physical Anesthesia Plan  ASA: III  Anesthesia Plan: Spinal   Post-op Pain Management:  Regional for Post-op pain   Induction: Intravenous  PONV Risk Score and Plan: 4 or greater and Ondansetron, Dexamethasone and Propofol infusion  Airway Management Planned: Simple Face Mask  Additional Equipment:   Intra-op Plan:   Post-operative Plan:   Informed Consent: I have reviewed the patients History and Physical, chart, labs and discussed the procedure including the risks, benefits and alternatives for the proposed anesthesia with the patient or authorized representative who has indicated his/her understanding and acceptance.     Dental advisory given  Plan Discussed with: CRNA  Anesthesia Plan Comments: (Cleared by PCP Dr. Judd Lien stating "moderate risk pt for moderate risk surgery." Dr. Pleas Koch also  ordered preop nuclear stress test which was nonischemic, low risk.  Nuclear stress 05/01/18 (copy on pt chart): Impression: 1.  No reversible ischemia.  Fixed inferior wall defect most likely represents diaphragmatic attenuation rather than scar. 2.  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  No focal hypokinesis. 3.  Left ventricular ejection fraction 51%. 4.  Noninvasive stratification: Low)       Anesthesia Quick Evaluation

## 2018-09-04 ENCOUNTER — Other Ambulatory Visit (HOSPITAL_COMMUNITY)
Admission: RE | Admit: 2018-09-04 | Discharge: 2018-09-04 | Disposition: A | Discharge status: Home / Self Care | Payer: Medicare Other | Source: Ambulatory Visit | Attending: Orthopaedic Surgery | Admitting: Orthopaedic Surgery

## 2018-09-04 DIAGNOSIS — Z01818 Encounter for other preprocedural examination: Secondary | ICD-10-CM | POA: Diagnosis not present

## 2018-09-04 LAB — SARS CORONAVIRUS 2 (TAT 6-24 HRS): SARS Coronavirus 2: NEGATIVE

## 2018-09-06 MED ORDER — VANCOMYCIN HCL 10 G IV SOLR
1500.0000 mg | INTRAVENOUS | Status: AC
  Administered 2018-09-08: 1500 mg via INTRAVENOUS
  Filled 2018-09-06 (×2): qty 1500

## 2018-09-08 ENCOUNTER — Encounter (HOSPITAL_COMMUNITY)
Admission: AD | Disposition: A | Discharge status: Skilled Nursing Facility | Payer: Self-pay | Source: Home / Self Care | Attending: Orthopaedic Surgery

## 2018-09-08 ENCOUNTER — Ambulatory Visit (HOSPITAL_COMMUNITY): Payer: Medicare Other | Admitting: Physician Assistant

## 2018-09-08 ENCOUNTER — Observation Stay (HOSPITAL_COMMUNITY): Payer: Medicare Other

## 2018-09-08 ENCOUNTER — Other Ambulatory Visit: Payer: Self-pay

## 2018-09-08 ENCOUNTER — Ambulatory Visit (HOSPITAL_COMMUNITY): Payer: Medicare Other | Admitting: Certified Registered Nurse Anesthetist

## 2018-09-08 ENCOUNTER — Inpatient Hospital Stay (HOSPITAL_COMMUNITY)
Admission: AD | Admit: 2018-09-08 | Discharge: 2018-09-11 | DRG: 470 | Disposition: A | Discharge status: Skilled Nursing Facility | Payer: Medicare Other | Attending: Internal Medicine | Admitting: Internal Medicine

## 2018-09-08 ENCOUNTER — Encounter (HOSPITAL_COMMUNITY): Payer: Self-pay

## 2018-09-08 DIAGNOSIS — I10 Essential (primary) hypertension: Secondary | ICD-10-CM | POA: Diagnosis present

## 2018-09-08 DIAGNOSIS — E669 Obesity, unspecified: Secondary | ICD-10-CM | POA: Diagnosis present

## 2018-09-08 DIAGNOSIS — M25761 Osteophyte, right knee: Secondary | ICD-10-CM | POA: Diagnosis present

## 2018-09-08 DIAGNOSIS — Z880 Allergy status to penicillin: Secondary | ICD-10-CM

## 2018-09-08 DIAGNOSIS — Z96643 Presence of artificial hip joint, bilateral: Secondary | ICD-10-CM | POA: Diagnosis present

## 2018-09-08 DIAGNOSIS — Z9884 Bariatric surgery status: Secondary | ICD-10-CM

## 2018-09-08 DIAGNOSIS — K5900 Constipation, unspecified: Secondary | ICD-10-CM | POA: Diagnosis present

## 2018-09-08 DIAGNOSIS — Z888 Allergy status to other drugs, medicaments and biological substances status: Secondary | ICD-10-CM

## 2018-09-08 DIAGNOSIS — J302 Other seasonal allergic rhinitis: Secondary | ICD-10-CM | POA: Diagnosis present

## 2018-09-08 DIAGNOSIS — F419 Anxiety disorder, unspecified: Secondary | ICD-10-CM | POA: Diagnosis present

## 2018-09-08 DIAGNOSIS — D638 Anemia in other chronic diseases classified elsewhere: Secondary | ICD-10-CM | POA: Diagnosis present

## 2018-09-08 DIAGNOSIS — M1711 Unilateral primary osteoarthritis, right knee: Secondary | ICD-10-CM | POA: Diagnosis not present

## 2018-09-08 DIAGNOSIS — Z09 Encounter for follow-up examination after completed treatment for conditions other than malignant neoplasm: Secondary | ICD-10-CM

## 2018-09-08 DIAGNOSIS — Z96611 Presence of right artificial shoulder joint: Secondary | ICD-10-CM | POA: Diagnosis present

## 2018-09-08 DIAGNOSIS — Z6838 Body mass index (BMI) 38.0-38.9, adult: Secondary | ICD-10-CM

## 2018-09-08 DIAGNOSIS — Z981 Arthrodesis status: Secondary | ICD-10-CM

## 2018-09-08 DIAGNOSIS — Z8719 Personal history of other diseases of the digestive system: Secondary | ICD-10-CM

## 2018-09-08 DIAGNOSIS — Z1159 Encounter for screening for other viral diseases: Secondary | ICD-10-CM

## 2018-09-08 DIAGNOSIS — M19012 Primary osteoarthritis, left shoulder: Secondary | ICD-10-CM | POA: Diagnosis present

## 2018-09-08 DIAGNOSIS — Z882 Allergy status to sulfonamides status: Secondary | ICD-10-CM

## 2018-09-08 DIAGNOSIS — Z90710 Acquired absence of both cervix and uterus: Secondary | ICD-10-CM

## 2018-09-08 DIAGNOSIS — Z96652 Presence of left artificial knee joint: Secondary | ICD-10-CM | POA: Diagnosis present

## 2018-09-08 DIAGNOSIS — K219 Gastro-esophageal reflux disease without esophagitis: Secondary | ICD-10-CM | POA: Diagnosis present

## 2018-09-08 DIAGNOSIS — Z91040 Latex allergy status: Secondary | ICD-10-CM

## 2018-09-08 DIAGNOSIS — Z9049 Acquired absence of other specified parts of digestive tract: Secondary | ICD-10-CM

## 2018-09-08 HISTORY — PX: TOTAL KNEE ARTHROPLASTY: SHX125

## 2018-09-08 SURGERY — ARTHROPLASTY, KNEE, TOTAL
Anesthesia: Spinal | Site: Knee | Laterality: Right

## 2018-09-08 MED ORDER — VANCOMYCIN HCL IN DEXTROSE 1-5 GM/200ML-% IV SOLN
1000.0000 mg | Freq: Two times a day (BID) | INTRAVENOUS | Status: AC
  Administered 2018-09-08: 1000 mg via INTRAVENOUS
  Filled 2018-09-08: qty 200

## 2018-09-08 MED ORDER — SERTRALINE HCL 100 MG PO TABS
100.0000 mg | ORAL_TABLET | Freq: Every day | ORAL | Status: DC
  Administered 2018-09-09 – 2018-09-11 (×3): 100 mg via ORAL
  Filled 2018-09-08 (×3): qty 1

## 2018-09-08 MED ORDER — BUPIVACAINE LIPOSOME 1.3 % IJ SUSP
20.0000 mL | Freq: Once | INTRAMUSCULAR | Status: DC
  Filled 2018-09-08: qty 20

## 2018-09-08 MED ORDER — LIDOCAINE 2% (20 MG/ML) 5 ML SYRINGE
INTRAMUSCULAR | Status: DC | PRN
  Administered 2018-09-08: 40 mg via INTRAVENOUS

## 2018-09-08 MED ORDER — ACETAMINOPHEN 325 MG PO TABS
325.0000 mg | ORAL_TABLET | Freq: Four times a day (QID) | ORAL | Status: DC | PRN
  Administered 2018-09-09 – 2018-09-10 (×3): 650 mg via ORAL
  Filled 2018-09-08 (×3): qty 2

## 2018-09-08 MED ORDER — LISINOPRIL 20 MG PO TABS
20.0000 mg | ORAL_TABLET | Freq: Every day | ORAL | Status: DC
  Administered 2018-09-08 – 2018-09-10 (×3): 20 mg via ORAL
  Filled 2018-09-08 (×4): qty 1

## 2018-09-08 MED ORDER — NYSTATIN 100000 UNIT/GM EX OINT
1.0000 "application " | TOPICAL_OINTMENT | Freq: Two times a day (BID) | CUTANEOUS | Status: DC | PRN
  Filled 2018-09-08: qty 15

## 2018-09-08 MED ORDER — LACTATED RINGERS IV SOLN
INTRAVENOUS | Status: DC
  Administered 2018-09-08 (×2): via INTRAVENOUS

## 2018-09-08 MED ORDER — METOCLOPRAMIDE HCL 5 MG/ML IJ SOLN
5.0000 mg | Freq: Three times a day (TID) | INTRAMUSCULAR | Status: DC | PRN
  Administered 2018-09-08 – 2018-09-10 (×2): 10 mg via INTRAVENOUS
  Filled 2018-09-08 (×2): qty 2

## 2018-09-08 MED ORDER — VITAMIN D 25 MCG (1000 UNIT) PO TABS
2000.0000 [IU] | ORAL_TABLET | Freq: Two times a day (BID) | ORAL | Status: DC
  Administered 2018-09-08 – 2018-09-11 (×6): 2000 [IU] via ORAL
  Filled 2018-09-08 (×6): qty 2

## 2018-09-08 MED ORDER — ROCURONIUM BROMIDE 10 MG/ML (PF) SYRINGE
PREFILLED_SYRINGE | INTRAVENOUS | Status: DC | PRN
  Administered 2018-09-08: 50 mg via INTRAVENOUS

## 2018-09-08 MED ORDER — POTASSIUM CHLORIDE CRYS ER 10 MEQ PO TBCR
10.0000 meq | EXTENDED_RELEASE_TABLET | Freq: Every day | ORAL | Status: DC
  Administered 2018-09-09 – 2018-09-11 (×3): 10 meq via ORAL
  Filled 2018-09-08 (×3): qty 1

## 2018-09-08 MED ORDER — ACETAMINOPHEN 500 MG PO TABS
1000.0000 mg | ORAL_TABLET | Freq: Once | ORAL | Status: AC
  Administered 2018-09-08: 1000 mg via ORAL
  Filled 2018-09-08: qty 2

## 2018-09-08 MED ORDER — HYDRALAZINE HCL 20 MG/ML IJ SOLN
INTRAMUSCULAR | Status: DC | PRN
  Administered 2018-09-08 (×2): 5 mg via INTRAVENOUS

## 2018-09-08 MED ORDER — MIDAZOLAM HCL 2 MG/2ML IJ SOLN
INTRAMUSCULAR | Status: AC
  Filled 2018-09-08: qty 2

## 2018-09-08 MED ORDER — BUPIVACAINE LIPOSOME 1.3 % IJ SUSP
INTRAMUSCULAR | Status: DC | PRN
  Administered 2018-09-08: 20 mL

## 2018-09-08 MED ORDER — SODIUM CHLORIDE 0.9 % IR SOLN
Status: DC | PRN
  Administered 2018-09-08: 3000 mL

## 2018-09-08 MED ORDER — POLYETHYLENE GLYCOL 3350 17 G PO PACK: 17.0000 g | PACK | Freq: Every day | ORAL | Status: DC | PRN

## 2018-09-08 MED ORDER — PROPOFOL 10 MG/ML IV BOLUS
INTRAVENOUS | Status: DC | PRN
  Administered 2018-09-08: 100 mg via INTRAVENOUS

## 2018-09-08 MED ORDER — PHENOL 1.4 % MT LIQD: 1.0000 | OROMUCOSAL | Status: DC | PRN

## 2018-09-08 MED ORDER — HYDROCHLOROTHIAZIDE 12.5 MG PO CAPS
12.5000 mg | ORAL_CAPSULE | Freq: Every day | ORAL | Status: DC
  Administered 2018-09-08 – 2018-09-10 (×3): 12.5 mg via ORAL
  Filled 2018-09-08 (×3): qty 1

## 2018-09-08 MED ORDER — BUPIVACAINE HCL (PF) 0.25 % IJ SOLN
INTRAMUSCULAR | Status: AC
  Filled 2018-09-08: qty 30

## 2018-09-08 MED ORDER — HYDROMORPHONE HCL 1 MG/ML IJ SOLN
INTRAMUSCULAR | Status: AC
  Administered 2018-09-08: 0.25 mg via INTRAVENOUS
  Filled 2018-09-08: qty 1

## 2018-09-08 MED ORDER — PROMETHAZINE HCL 25 MG/ML IJ SOLN
6.2500 mg | INTRAMUSCULAR | Status: DC | PRN
  Administered 2018-09-08 (×3): 6.25 mg via INTRAVENOUS

## 2018-09-08 MED ORDER — DOCUSATE SODIUM 100 MG PO CAPS
100.0000 mg | ORAL_CAPSULE | Freq: Two times a day (BID) | ORAL | Status: DC
  Administered 2018-09-08 – 2018-09-10 (×4): 100 mg via ORAL
  Filled 2018-09-08 (×6): qty 1

## 2018-09-08 MED ORDER — EPHEDRINE SULFATE-NACL 50-0.9 MG/10ML-% IV SOSY
PREFILLED_SYRINGE | INTRAVENOUS | Status: DC | PRN
  Administered 2018-09-08: 5 mg via INTRAVENOUS
  Administered 2018-09-08: 10 mg via INTRAVENOUS

## 2018-09-08 MED ORDER — DEXAMETHASONE SODIUM PHOSPHATE 10 MG/ML IJ SOLN
INTRAMUSCULAR | Status: DC | PRN
  Administered 2018-09-08: 5 mg via INTRAVENOUS

## 2018-09-08 MED ORDER — BUPIVACAINE HCL (PF) 0.25 % IJ SOLN
INTRAMUSCULAR | Status: DC | PRN
  Administered 2018-09-08: 20 mL

## 2018-09-08 MED ORDER — ASPIRIN EC 325 MG PO TBEC
325.0000 mg | DELAYED_RELEASE_TABLET | Freq: Every day | ORAL | Status: DC
  Administered 2018-09-09 – 2018-09-11 (×3): 325 mg via ORAL
  Filled 2018-09-08 (×3): qty 1

## 2018-09-08 MED ORDER — FENTANYL CITRATE (PF) 100 MCG/2ML IJ SOLN
INTRAMUSCULAR | Status: AC
  Administered 2018-09-08: 50 ug via INTRAVENOUS
  Filled 2018-09-08: qty 2

## 2018-09-08 MED ORDER — PROPOFOL 500 MG/50ML IV EMUL
INTRAVENOUS | Status: DC | PRN
  Administered 2018-09-08: 20 ug/kg/min via INTRAVENOUS

## 2018-09-08 MED ORDER — METHOCARBAMOL 1000 MG/10ML IJ SOLN
500.0000 mg | Freq: Four times a day (QID) | INTRAVENOUS | Status: DC | PRN
  Filled 2018-09-08: qty 5

## 2018-09-08 MED ORDER — METHOCARBAMOL 500 MG PO TABS
500.0000 mg | ORAL_TABLET | Freq: Four times a day (QID) | ORAL | Status: DC | PRN
  Administered 2018-09-08 – 2018-09-10 (×4): 500 mg via ORAL
  Filled 2018-09-08 (×4): qty 1

## 2018-09-08 MED ORDER — HYDROCODONE-ACETAMINOPHEN 7.5-325 MG PO TABS
1.0000 | ORAL_TABLET | ORAL | Status: DC | PRN
  Administered 2018-09-08: 1 via ORAL
  Administered 2018-09-08 – 2018-09-09 (×3): 2 via ORAL
  Administered 2018-09-10: 1 via ORAL
  Administered 2018-09-10: 2 via ORAL
  Administered 2018-09-11: 1 via ORAL
  Filled 2018-09-08: qty 2
  Filled 2018-09-08: qty 1
  Filled 2018-09-08 (×2): qty 2
  Filled 2018-09-08 (×2): qty 1
  Filled 2018-09-08: qty 2

## 2018-09-08 MED ORDER — FENTANYL CITRATE (PF) 100 MCG/2ML IJ SOLN
50.0000 ug | Freq: Once | INTRAMUSCULAR | Status: AC
  Administered 2018-09-08: 50 ug via INTRAVENOUS

## 2018-09-08 MED ORDER — ONDANSETRON HCL 4 MG/2ML IJ SOLN
INTRAMUSCULAR | Status: DC | PRN
  Administered 2018-09-08: 4 mg via INTRAVENOUS

## 2018-09-08 MED ORDER — MENTHOL 3 MG MT LOZG: 1.0000 | LOZENGE | OROMUCOSAL | Status: DC | PRN

## 2018-09-08 MED ORDER — PROMETHAZINE HCL 25 MG/ML IJ SOLN
INTRAMUSCULAR | Status: AC
  Administered 2018-09-08: 6.25 mg via INTRAVENOUS
  Filled 2018-09-08: qty 1

## 2018-09-08 MED ORDER — FENTANYL CITRATE (PF) 100 MCG/2ML IJ SOLN
INTRAMUSCULAR | Status: DC | PRN
  Administered 2018-09-08 (×3): 50 ug via INTRAVENOUS

## 2018-09-08 MED ORDER — BUPIVACAINE HCL (PF) 0.5 % IJ SOLN
INTRAMUSCULAR | Status: DC | PRN
  Administered 2018-09-08: 20 mL via PERINEURAL

## 2018-09-08 MED ORDER — ONDANSETRON HCL 4 MG PO TABS: 4.0000 mg | ORAL_TABLET | Freq: Four times a day (QID) | ORAL | Status: DC | PRN

## 2018-09-08 MED ORDER — TRAZODONE HCL 50 MG PO TABS
50.0000 mg | ORAL_TABLET | Freq: Every day | ORAL | Status: DC
  Administered 2018-09-08 – 2018-09-10 (×3): 50 mg via ORAL
  Filled 2018-09-08 (×3): qty 1

## 2018-09-08 MED ORDER — CHLORHEXIDINE GLUCONATE 4 % EX LIQD: 60.0000 mL | Freq: Once | CUTANEOUS | Status: DC

## 2018-09-08 MED ORDER — DM-GUAIFENESIN ER 30-600 MG PO TB12: 1.0000 | ORAL_TABLET | Freq: Two times a day (BID) | ORAL | Status: DC | PRN

## 2018-09-08 MED ORDER — ONDANSETRON HCL 4 MG/2ML IJ SOLN: 4.0000 mg | Freq: Four times a day (QID) | INTRAMUSCULAR | Status: DC | PRN

## 2018-09-08 MED ORDER — FENTANYL CITRATE (PF) 250 MCG/5ML IJ SOLN
INTRAMUSCULAR | Status: AC
  Filled 2018-09-08: qty 5

## 2018-09-08 MED ORDER — METOCLOPRAMIDE HCL 5 MG PO TABS
5.0000 mg | ORAL_TABLET | Freq: Three times a day (TID) | ORAL | Status: DC | PRN
  Administered 2018-09-10: 10 mg via ORAL
  Filled 2018-09-08: qty 2

## 2018-09-08 MED ORDER — SUCCINYLCHOLINE CHLORIDE 200 MG/10ML IV SOSY
PREFILLED_SYRINGE | INTRAVENOUS | Status: DC | PRN
  Administered 2018-09-08: 100 mg via INTRAVENOUS

## 2018-09-08 MED ORDER — PANTOPRAZOLE SODIUM 40 MG PO TBEC
40.0000 mg | DELAYED_RELEASE_TABLET | Freq: Every day | ORAL | Status: DC
  Administered 2018-09-09 – 2018-09-11 (×3): 40 mg via ORAL
  Filled 2018-09-08 (×3): qty 1

## 2018-09-08 MED ORDER — SODIUM CHLORIDE 0.9 % IV SOLN
INTRAVENOUS | Status: DC
  Administered 2018-09-08 – 2018-09-10 (×2): via INTRAVENOUS

## 2018-09-08 MED ORDER — ALBUTEROL SULFATE (2.5 MG/3ML) 0.083% IN NEBU: 2.5000 mg | INHALATION_SOLUTION | Freq: Every day | RESPIRATORY_TRACT | Status: DC | PRN

## 2018-09-08 MED ORDER — HYDROMORPHONE HCL 1 MG/ML IJ SOLN
0.2500 mg | INTRAMUSCULAR | Status: DC | PRN
  Administered 2018-09-08: 0.5 mg via INTRAVENOUS
  Administered 2018-09-08: 0.25 mg via INTRAVENOUS

## 2018-09-08 SURGICAL SUPPLY — 78 items
APL SKNCLS STERI-STRIP NONHPOA (GAUZE/BANDAGES/DRESSINGS) ×1
ATTUNE PS FEM RT SZ 5 CEM KNEE (Femur) ×1 IMPLANT
ATTUNE PSRP INSR SZ5 5 KNEE (Insert) ×1 IMPLANT
BANDAGE ACE 4X5 VEL STRL LF (GAUZE/BANDAGES/DRESSINGS) ×2 IMPLANT
BANDAGE ESMARK 6X9 LF (GAUZE/BANDAGES/DRESSINGS) ×1 IMPLANT
BASE TIBIAL ROT PLAT SZ 5 KNEE (Knees) IMPLANT
BENZOIN TINCTURE PRP APPL 2/3 (GAUZE/BANDAGES/DRESSINGS) ×2 IMPLANT
BLADE SAGITTAL 25.0X1.19X90 (BLADE) ×2 IMPLANT
BLADE SAW SGTL 13X75X1.27 (BLADE) ×2 IMPLANT
BNDG CMPR 9X6 STRL LF SNTH (GAUZE/BANDAGES/DRESSINGS) ×1
BNDG CMPR MED 10X6 ELC LF (GAUZE/BANDAGES/DRESSINGS) ×1
BNDG ELASTIC 6X10 VLCR STRL LF (GAUZE/BANDAGES/DRESSINGS) ×2 IMPLANT
BNDG ESMARK 6X9 LF (GAUZE/BANDAGES/DRESSINGS) ×2
BOWL SMART MIX CTS (DISPOSABLE) ×2 IMPLANT
BSPLAT TIB 5 CMNT ROT PLAT STR (Knees) ×1 IMPLANT
CEMENT HV SMART SET (Cement) ×4 IMPLANT
COVER SURGICAL LIGHT HANDLE (MISCELLANEOUS) ×2 IMPLANT
COVER WAND RF STERILE (DRAPES) ×2 IMPLANT
CUFF TOURN SGL QUICK 34 (TOURNIQUET CUFF) ×2
CUFF TOURNIQUET SINGLE 44IN (TOURNIQUET CUFF) IMPLANT
CUFF TRNQT CYL 34X4.125X (TOURNIQUET CUFF) ×1 IMPLANT
DRAPE ORTHO SPLIT 77X108 STRL (DRAPES) ×4
DRAPE SURG ORHT 6 SPLT 77X108 (DRAPES) ×2 IMPLANT
DRAPE U-SHAPE 47X51 STRL (DRAPES) ×2 IMPLANT
DRSG AQUACEL AG ADV 3.5X10 (GAUZE/BANDAGES/DRESSINGS) ×1 IMPLANT
DRSG PAD ABDOMINAL 8X10 ST (GAUZE/BANDAGES/DRESSINGS) ×2 IMPLANT
DURAPREP 26ML APPLICATOR (WOUND CARE) ×4 IMPLANT
ELECT REM PT RETURN 9FT ADLT (ELECTROSURGICAL) ×2
ELECTRODE REM PT RTRN 9FT ADLT (ELECTROSURGICAL) ×1 IMPLANT
EVACUATOR 1/8 PVC DRAIN (DRAIN) IMPLANT
FACESHIELD WRAPAROUND (MASK) ×4 IMPLANT
FACESHIELD WRAPAROUND OR TEAM (MASK) ×2 IMPLANT
GAUZE SPONGE 4X4 12PLY STRL (GAUZE/BANDAGES/DRESSINGS) ×2 IMPLANT
GAUZE XEROFORM 5X9 LF (GAUZE/BANDAGES/DRESSINGS) ×2 IMPLANT
GLOVE BIOGEL PI IND STRL 8 (GLOVE) ×2 IMPLANT
GLOVE BIOGEL PI INDICATOR 8 (GLOVE) ×2
GLOVE ORTHO TXT STRL SZ7.5 (GLOVE) ×4 IMPLANT
GOWN STRL REUS W/ TWL LRG LVL3 (GOWN DISPOSABLE) ×1 IMPLANT
GOWN STRL REUS W/ TWL XL LVL3 (GOWN DISPOSABLE) ×1 IMPLANT
GOWN STRL REUS W/TWL 2XL LVL3 (GOWN DISPOSABLE) ×2 IMPLANT
GOWN STRL REUS W/TWL LRG LVL3 (GOWN DISPOSABLE) ×2
GOWN STRL REUS W/TWL XL LVL3 (GOWN DISPOSABLE) ×2
HANDPIECE INTERPULSE COAX TIP (DISPOSABLE) ×2
IMMOBILIZER KNEE 22 UNIV (SOFTGOODS) ×2 IMPLANT
KIT BASIN OR (CUSTOM PROCEDURE TRAY) ×2 IMPLANT
KIT TURNOVER KIT B (KITS) ×2 IMPLANT
MANIFOLD NEPTUNE II (INSTRUMENTS) ×2 IMPLANT
MARKER SKIN DUAL TIP RULER LAB (MISCELLANEOUS) ×2 IMPLANT
NDL 18GX1X1/2 (RX/OR ONLY) (NEEDLE) ×1 IMPLANT
NDL HYPO 25GX1X1/2 BEV (NEEDLE) ×1 IMPLANT
NEEDLE 18GX1X1/2 (RX/OR ONLY) (NEEDLE) ×2 IMPLANT
NEEDLE HYPO 25GX1X1/2 BEV (NEEDLE) ×2 IMPLANT
NS IRRIG 1000ML POUR BTL (IV SOLUTION) ×2 IMPLANT
PACK TOTAL JOINT (CUSTOM PROCEDURE TRAY) ×2 IMPLANT
PAD ABD 7.5X8 STRL (GAUZE/BANDAGES/DRESSINGS) ×2 IMPLANT
PAD ARMBOARD 7.5X6 YLW CONV (MISCELLANEOUS) ×4 IMPLANT
PAD CAST 4YDX4 CTTN HI CHSV (CAST SUPPLIES) ×1 IMPLANT
PADDING CAST COTTON 4X4 STRL (CAST SUPPLIES) ×2
PADDING CAST COTTON 6X4 STRL (CAST SUPPLIES) ×2 IMPLANT
PATELLA MEDIAL ATTUN 35MM KNEE (Knees) ×1 IMPLANT
SET HNDPC FAN SPRY TIP SCT (DISPOSABLE) ×1 IMPLANT
STAPLER VISISTAT 35W (STAPLE) IMPLANT
STRIP CLOSURE SKIN 1/2X4 (GAUZE/BANDAGES/DRESSINGS) ×4 IMPLANT
SUCTION FRAZIER HANDLE 10FR (MISCELLANEOUS) ×1
SUCTION TUBE FRAZIER 10FR DISP (MISCELLANEOUS) ×1 IMPLANT
SUT VIC AB 0 CT1 27 (SUTURE) ×2
SUT VIC AB 0 CT1 27XBRD ANBCTR (SUTURE) ×1 IMPLANT
SUT VIC AB 1 CTX 36 (SUTURE) ×4
SUT VIC AB 1 CTX36XBRD ANBCTR (SUTURE) ×2 IMPLANT
SUT VIC AB 2-0 CT1 27 (SUTURE) ×4
SUT VIC AB 2-0 CT1 TAPERPNT 27 (SUTURE) ×2 IMPLANT
SUT VIC AB 3-0 X1 27 (SUTURE) ×2 IMPLANT
SYR 50ML LL SCALE MARK (SYRINGE) ×2 IMPLANT
SYR CONTROL 10ML LL (SYRINGE) ×2 IMPLANT
TIBIAL BASE ROT PLAT SZ 5 KNEE (Knees) ×2 IMPLANT
TOWEL GREEN STERILE (TOWEL DISPOSABLE) ×2 IMPLANT
TOWEL GREEN STERILE FF (TOWEL DISPOSABLE) ×2 IMPLANT
TRAY CATH 16FR W/PLASTIC CATH (SET/KITS/TRAYS/PACK) IMPLANT

## 2018-09-08 NOTE — H&P (Signed)
Medical Consultation   [name] [name]  OEV:[id number]  DOB: 06-02-1940  DOA: 09/08/2018  PCP: Curlene Labrum, MD   Outpatient Specialists:  Delsa Grana, NP gastroenterology   Requesting physician: Dr. Lorin Mercy, orthopedic surgery  Reason for consultation: Elevated blood pressures in the operating room  History of Present Illness: [name] [name] is an 78 y.o. female with a past medical history significant for anemia, anxiety, depression, osteoarthritis, gastroesophageal reflux disease, hiatal hernia, hypertension, obesity, seasonal allergies and history of pneumonia who comes to our facility today for right total knee arthroplasty due to right knee osteoarthritis not being responsive to alternative modes of treatment.  In the operating room patient was noted to have systolic blood pressures in the 180s he did respond to treatment there.  Operatively I was consulted for further evaluation and management.  Patient does have a history of hypertension and states she used to be on blood pressure medicines but it was stopped after 1 of her surgeries.  She does not have any clear idea about when it was stopped.  Review of her medical notes shows that she has not had it listed on her medications since June 2019.  I suspect it may have been stopped somewhere about that time.  I was unable to find notes indicating medication was stopped although not all of her physicians are on our system.  After initially stable blood pressures in the recovery room blood pressures have begun to rise again.  Patient denies any headache, nausea, vomiting, blurry vision, urinary frequency, urinary comfort, chest pain, or shortness of breath.   Review of Systems:  As per HPI otherwise 10 point review of systems negative.     Past Medical History: Past Medical History:  Diagnosis Date  . Anemia   . Anxiety   . Arthritis   . Barrett's esophagus    seen on 2016 egd at [facility]  . Depression   .  GERD (gastroesophageal reflux disease)   . H/O hiatal hernia   . Hypertension   . Obesity   . Pneumonia   . PONV (postoperative nausea and vomiting)   . Seasonal allergies   . Sepsis ([place]) 10/02/2013    Past Surgical History: Past Surgical History:  Procedure Laterality Date  . ABDOMINAL HYSTERECTOMY    . ABDOMINAL SURGERY    . APPENDECTOMY    . BACK SURGERY     lumbar x2 by Dr Arnoldo Morale  . CARPAL TUNNEL RELEASE Right 03/2014   Dr. Lorin Mercy  . CATARACT EXTRACTION W/PHACO Left 08/06/2013   Procedure: CATARACT EXTRACTION PHACO AND INTRAOCULAR LENS PLACEMENT LEFT EYE;  Surgeon: Tonny Branch, MD;  [facility]: [facility];  Service: Ophthalmology;  Laterality: Left;  CDE: 9.55  . CATARACT EXTRACTION W/PHACO Right 08/24/2013   Procedure: CATARACT EXTRACTION PHACO AND INTRAOCULAR LENS PLACEMENT (IOC);  Surgeon: Tonny Branch, MD;  Location: [facility];  Service: Ophthalmology;  Laterality: Right;  CDE:8.30  . CERVICAL FUSION    . CHOLECYSTECTOMY    . COLONOSCOPY N/A 11/03/2013   SLF: 1. Normal mucosa in the terminal ileum. 2. 13 colon polyps removed. 3. Moderate diverticulosis in the descending colon and sigmoid colon 4. the left colon is redundant 5. Small internal  hemorrhoids.  . COLONOSCOPY N/A 09/25/2017   Procedure: COLONOSCOPY;  Surgeon: Rogene Houston, MD;  [facility];  Service: Endoscopy;  Laterality: N/A;  1:55  . ESOPHAGOGASTRODUODENOSCOPY N/A 11/03/2013  SLF: 1. 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 Mild Non-erosive gastritis  . 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 s/p gastric bypass  . EYE SURGERY    . GASTRIC BYPASS  1979   revision in 1980   . HIATAL HERNIA REPAIR     [facility] in 2011  . JOINT REPLACEMENT Right 1995   hip  . JOINT REPLACEMENT Left 2008   hip  . MEDIAL PARTIAL KNEE  REPLACEMENT Left    Dr Ronnie Derby  . PARAESOPHAGEAL HERNIA REPAIR  SEP 2010 DR. MCNATT  . POLYPECTOMY  09/25/2017   Procedure: POLYPECTOMY;  Surgeon: Rogene Houston, MD;  [facility];  Service: Endoscopy;;  colon  . SHOULDER ARTHROSCOPY Right   . SHOULDER ARTHROSCOPY Right    x2  . SKIN BIOPSY    . TONSILLECTOMY    . TOTAL SHOULDER ARTHROPLASTY Right 02/16/2013   Procedure: TOTAL SHOULDER ARTHROPLASTY;  Surgeon: Marybelle Killings, MD;  Location: [place];  Service: Orthopedics;  Laterality: Right;  Right Total Shoulder Arthroplasty, Cemented     Allergies:   Allergies  Allergen Reactions  . Novocain [Procaine Hcl] Other (See Comments)    Unknown-passed out with novocaine injected for dental surgery.  . Other Hives and Other (See Comments)    EKG pads - need to use pediatric pads  . Latex Rash  . Penicillins Rash and Other (See Comments)    Has patient had a PCN reaction causing immediate rash, facial/tongue/throat swelling, SOB or lightheadedness with hypotension: Yes Has patient had a PCN reaction causing severe rash involving mucus membranes or skin necrosis: No Has patient had a PCN reaction that required hospitalization No Has patient had a PCN reaction occurring within the last 10 years: No  If all of the above answers are "NO", then may proceed with Cephalosporin use.   . Sulfa Antibiotics Rash     Social History:  reports that she has never smoked. She has never used smokeless tobacco. She reports that she does not drink alcohol or use drugs.   Family History: Family History  Problem Relation Age of Onset  . Colon cancer Brother        IN HIS 60s-METASTATIC  . Colon polyps Paternal Grandfather   . Breast cancer Mother   . Hypertension Father   . Heart disease Father   . Hypertension Brother   . Heart disease Brother       Physical Exam: Vitals:   09/08/18 1635 09/08/18 1650 09/08/18 1656 09/08/18 1709  BP: 131/70 (!) 145/66 (!) 147/72 (!) 151/54   Pulse: 80 80 80 79  Resp: 19 20 19 16   Temp:   (!) 97 F (36.1 C) 98 F (36.7 C)  TempSrc:    Oral  SpO2: 92% 95% 95% 96%  Weight:      Height:        Constitutional: Developed well-nourished,  Alert and awake, oriented x3, not in any acute distress. Eyes: PERLA, EOMI, irises appear normal, anicteric sclera,  ENMT: external ears and nose appear normal, normal            Lips appears normal, oropharynx mucosa, tongue, posterior pharynx appear normal  Neck: neck appears normal, no masses, normal ROM, no thyromegaly, no JVD  CVS: S1-S2 clear, no murmur rubs or gallops, no LE edema, normal pedal pulses  Respiratory:  clear to auscultation bilaterally, no wheezing, rales or rhonchi. Respiratory effort normal. No accessory muscle use.  Abdomen: soft nontender, nondistended, normal bowel sounds, no hepatosplenomegaly, no hernias  Musculoskeletal: : no cyanosis, clubbing or edema noted bilaterally; right knee dressing is clean and dry Neuro: Cranial nerves II-XII intact, strength, sensation, reflexes Psych: judgement and insight appear normal, stable mood and affect, mental status Skin: no rashes or lesions or ulcers, no induration or nodules   Data reviewed:  I have personally reviewed following labs and imaging studies Labs:  CBC: Recent Labs  Lab 09/02/18 1151  WBC 5.7  HGB 13.1  HCT 39.7  MCV 89.0  PLT 452*    Basic Metabolic Panel: Recent Labs  Lab 09/02/18 1151  NA 141  K 3.4*  CL 108  CO2 24  GLUCOSE 129*  BUN 18  CREATININE 1.05*  CALCIUM 8.7*   GFR Estimated Creatinine Clearance: 51.1 mL/min (A) (by C-G formula based on SCr of 1.05 mg/dL (H)). Liver Function Tests: Recent Labs  Lab 09/02/18 1151  AST 19  ALT 17  ALKPHOS 118  BILITOT 0.4  PROT 6.6  ALBUMIN 4.0   Urinalysis    Component Value Date/Time   COLORURINE YELLOW 09/02/2018 1150   APPEARANCEUR CLEAR 09/02/2018 1150   LABSPEC 1.025 09/02/2018 1150   PHURINE 5.0 09/02/2018 1150   GLUCOSEU  NEGATIVE 09/02/2018 1150   HGBUR NEGATIVE 09/02/2018 1150   BILIRUBINUR NEGATIVE 09/02/2018 1150   KETONESUR NEGATIVE 09/02/2018 1150   PROTEINUR 30 (A) 09/02/2018 1150   UROBILINOGEN 0.2 12/28/2013 1310   NITRITE NEGATIVE 09/02/2018 [place] 09/02/2018 1150     Microbiology Recent Results (from the past 240 hour(s))  Surgical pcr screen     Status: None   Collection Time: 09/02/18 11:50 AM   Specimen: Nasal Mucosa; Nasal Swab  Result Value Ref Range Status   MRSA, PCR NEGATIVE NEGATIVE Final   Staphylococcus aureus NEGATIVE NEGATIVE Final    Comment: (NOTE) The Xpert SA Assay (FDA approved for NASAL specimens in patients 59 years of age and older), is one component of a comprehensive surveillance program. It is not intended to diagnose infection nor to guide or monitor treatment. Performed at [facility], [place] [address], [place], [place] [address]   SARS Coronavirus 2 (Performed in [place] hospital lab)     Status: None   Collection Time: 09/04/18 11:17 AM   Specimen: Nasal Swab  Result Value Ref Range Status   SARS Coronavirus 2 NEGATIVE NEGATIVE Final    Comment: (NOTE) SARS-CoV-2 target nucleic acids are NOT DETECTED. The SARS-CoV-2 RNA is generally detectable in upper and lower respiratory specimens during the acute phase of infection. Negative results do not preclude SARS-CoV-2 infection, do not rule out co-infections with other pathogens, and should not be used as the sole basis for treatment or other patient management decisions. Negative results must be combined with clinical observations, patient history, and epidemiological information. The expected result is Negative. Fact Sheet for Patients: SugarRoll.be Fact Sheet for Healthcare Providers: https://www.woods-mathews.com/ This test is not yet approved or cleared by the [place] FDA and  has been authorized for detection and/or  diagnosis of SARS-CoV-2 by FDA under an Emergency Use Authorization (EUA). This EUA will remain  in effect (meaning this test can be used) for the duration of the COVID-19 declaration under Section 56 4(b)(1) of the Act, 21 U.S.C. section 360bbb-3(b)(1), unless the authorization is terminated or revoked sooner. Performed at [facility], [place] [address] [place], [place] [address]        Inpatient Medications:   Scheduled Meds: . [START ON 09/09/2018] aspirin EC  325 mg Oral Q breakfast  . bupivacaine liposome  20 mL Infiltration Once  . cholecalciferol  2,000 Units Oral BID  . docusate sodium  100 mg Oral BID  . hydrochlorothiazide  12.5 mg Oral Daily  . lisinopril  20 mg Oral Daily  . [START ON 09/09/2018] pantoprazole  40 mg Oral Daily  . potassium chloride  10 mEq Oral Daily  . [START ON 09/09/2018] sertraline  100 mg Oral Daily  . traZODone  50 mg Oral QHS   Continuous Infusions: . sodium chloride    . lactated ringers 10 mL/hr at 09/08/18 1042  . methocarbamol (ROBAXIN) IV    . vancomycin       Radiological Exams on Admission: Dg Knee 1-2 Views Right  Result Date: 09/08/2018 CLINICAL DATA:  RIGHT knee replacement surgery today EXAM: RIGHT KNEE - 1-2 VIEW COMPARISON:  Portable exam 1616 hours compared to preoperative radiographs of 04/02/2017 FINDINGS: Osseous demineralization. Components of a RIGHT knee prosthesis are identified. No fracture, dislocation, bone destruction or periprosthetic lucency. Anterior skin clips and soft tissue swelling. IMPRESSION: RIGHT knee prosthesis without acute complication. Electronically Signed   By: Lavonia Dana M.D.   On: 09/08/2018 16:51    Impression/Recommendations Principal Problem:   Primary hypertension Active Problems:   GERD (gastroesophageal reflux disease)   Unilateral primary osteoarthritis, right knee   Arthritis of right knee  1.  Primary hypertension: Reviewed patient's blood pressures since June 2019 and many of the  readings are quite elevated in the 160s.  The patient had blood pressures as high as 180s in the operating room.  This I am going to start lisinopril hydrochlorothiazide 20/12.5  1 p.o. daily.  Patient to get a dose today.  Monitor her response to this medications.  She reports no history of prior difficulties with the medication.  2.  Gastroesophageal reflux disease: Continue home medication management.  3.  Unilateral primary arthritis of right knee: Status post total knee replacement today with Dr. Lorin Mercy.  4.  Arthritis of right knee management as above.  Thank you for this consultation.  Our [name] hospitalist team will follow the patient with you.   Time Spent: 28 minutes  Lady Deutscher M.D. [facility] 09/08/2018, 5:31 PM

## 2018-09-08 NOTE — Transfer of Care (Signed)
Immediate Anesthesia Transfer of Care Note  Patient: [name] [name]  Procedure(s) Performed: RIGHT TOTAL KNEE ARTHROPLASTY (Right Knee)  Patient Location: PACU  Anesthesia Type:General  Level of Consciousness: awake, alert  and oriented  Airway & Oxygen Therapy: Patient Spontanous Breathing and Patient connected to face mask oxygen  Post-op Assessment: Report given to RN, Post -op Vital signs reviewed and stable and Patient moving all extremities  Post vital signs: Reviewed and stable  Last Vitals:  Vitals Value Taken Time  BP 129/59 09/08/18 1521  Temp    Pulse 75 09/08/18 1523  Resp 24 09/08/18 1523  SpO2 98 % 09/08/18 1523  Vitals shown include unvalidated device data.  Last Pain:  Vitals:   09/08/18 1130  TempSrc:   PainSc: 1       Patients Stated Pain Goal: 1 (69/24/93 2419)  Complications: No apparent anesthesia complications

## 2018-09-08 NOTE — Interval H&P Note (Signed)
History and Physical Interval Note:  09/08/2018 12:36 PM  [name] [name]  has presented today for surgery, with the diagnosis of right knee osteoarthritis.  The various methods of treatment have been discussed with the patient and family. After consideration of risks, benefits and other options for treatment, the patient has consented to  Procedure(s): RIGHT TOTAL KNEE ARTHROPLASTY (Right) as a surgical intervention.  The patient's history has been reviewed, patient examined, no change in status, stable for surgery.  I have reviewed the patient's chart and labs.  Questions were answered to the patient's satisfaction.     Marybelle Killings

## 2018-09-08 NOTE — H&P (Signed)
TOTAL KNEE ADMISSION H&P  Patient is being admitted for right total knee arthroplasty.  Subjective:  Chief Complaint:right knee pain. 78 year old white female history of end-stage DJD right knee comes in for preop evaluation.  Knee symptoms unchanged from previous visit.  She is want to proceed with total knee replacement as scheduled.  I am awaiting preop medical clearance.  Patient states that this has been done already.  Patient admits to having some cough and some wheezing but this has been controlled when she uses her nebulizer.  No complaints of chest pain.  Some constipation which is chronic.  Patient states that she lives alone and will likely be needing rehab versus skilled nurse facility placement.  HPI: MARLON VONRUDEN, 78 y.o. female, has a history of pain and functional disability in the right knee due to trauma and has failed non-surgical conservative treatments for greater than 12 weeks to includeNSAID's and/or analgesics, corticosteriod injections, use of assistive devices and activity modification.  Onset of symptoms was gradual, starting 10 years ago with gradually worsening course since that time.   Patient currently rates pain in the right knee(s) at 10 out of 10 with activity. Patient has worsening of pain with activity and weight bearing, pain that interferes with activities of daily living, pain with passive range of motion, crepitus and joint swelling.  Patient has evidence of subchondral sclerosis, periarticular osteophytes and joint space narrowing by imaging studies.  There is no active infection.  Patient Active Problem List   Diagnosis Date Noted  . Unilateral primary osteoarthritis, right knee 08/14/2018  . Primary osteoarthritis, 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  . History of arthroplasty of right shoulder 08/14/2018  . History of colonic polyps 08/14/2017  . Choledocholithiasis   . Abnormal CT scan, esophagus   . Cholestasis 02/05/2017  . Depression 02/05/2017  .  Hypomagnesemia 02/05/2017  . Bradycardia 02/05/2017  . Transaminitis 07/08/2016  . Constipation 07/08/2016  . GIB (gastrointestinal bleeding) 07/08/2016  . HCAP (healthcare-associated pneumonia) 09/23/2015  . AKI (acute kidney injury) ([place]) 07/21/2015  . Hypokalemia 07/21/2015  . CAP (community acquired pneumonia) 07/14/2015  . Upper abdominal pain 06/10/2014  . Folliculitis of perineum 02/24/2014  . Giant comedone 12/21/2013  . 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 Acrochordon 11/25/2013  . Dysphagia 10/29/2013  . Class 2 severe obesity due to excess calories with serious comorbidity and body mass index (BMI) of 38.0 to 38.9 in adult ([place]) 10/02/2013  . Hypertension   . GERD (gastroesophageal reflux disease)   . Anxiety   . Anemia of chronic disease    Past Medical History:  Diagnosis Date  . Anemia   . Anxiety   . Arthritis   . Barrett's esophagus    seen on [facility]d at [facility]  . Depression   . GERD (gastroesophageal reflux disease)   . H/O hiatal hernia   . Hypertension   . Obesity   . Pneumonia   . PONV (postoperative nausea and vomiting)   . Seasonal allergies   . Sepsis ([place]) 10/02/2013    Past Surgical History:  Procedure Laterality Date  . ABDOMINAL HYSTERECTOMY    . ABDOMINAL SURGERY    . APPENDECTOMY    . BACK SURGERY     lumbar x2 by Dr Arnoldo Morale  . CARPAL TUNNEL RELEASE Right 03/2014   Dr. Lorin Mercy  . CATARACT EXTRACTION W/PHACO Left 08/06/2013   Procedure: CATARACT EXTRACTION PHACO AND INTRAOCULAR LENS PLACEMENT LEFT EYE;  Surgeon: Tonny Branch, MD;  [facility];  Service: Ophthalmology;  Laterality: Left;  CDE: 9.55  . CATARACT EXTRACTION W/PHACO Right 08/24/2013   Procedure: CATARACT EXTRACTION PHACO AND INTRAOCULAR LENS PLACEMENT (IOC);  Surgeon: Tonny Branch, MD;  Location: [facility];  Service: Ophthalmology;  Laterality: Right;  CDE:8.30  . CERVICAL FUSION    . CHOLECYSTECTOMY    . COLONOSCOPY N/A 11/03/2013   SLF: 1. Normal mucosa in the  terminal ileum. 2. 13 colon polyps removed. 3. Moderate diverticulosis in the descending colon and sigmoid colon 4. the left colon is redundant 5. Small internal  hemorrhoids.  . COLONOSCOPY N/A 09/25/2017   Procedure: COLONOSCOPY;  Surgeon: Rogene Houston, MD;  [facility];  Service: Endoscopy;  Laterality: N/A;  1:55  . ESOPHAGOGASTRODUODENOSCOPY N/A 11/03/2013   SLF: 1. 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 Mild Non-erosive gastritis  . 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 s/p gastric bypass  . EYE SURGERY    . GASTRIC BYPASS  1979   revision in 1980   . HIATAL HERNIA REPAIR     [facility] in 2011  . JOINT REPLACEMENT Right 1995   hip  . JOINT REPLACEMENT Left 2008   hip  . MEDIAL PARTIAL KNEE REPLACEMENT Left    Dr Ronnie Derby  . PARAESOPHAGEAL HERNIA REPAIR  SEP 2010 DR. MCNATT  . POLYPECTOMY  09/25/2017   Procedure: POLYPECTOMY;  Surgeon: Rogene Houston, MD;  [facility];  Service: Endoscopy;;  colon  . SHOULDER ARTHROSCOPY Right   . SHOULDER ARTHROSCOPY Right    x2  . SKIN BIOPSY    . TONSILLECTOMY    . TOTAL SHOULDER ARTHROPLASTY Right 02/16/2013   Procedure: TOTAL SHOULDER ARTHROPLASTY;  Surgeon: Marybelle Killings, MD;  Location: [place];  Service: Orthopedics;  Laterality: Right;  Right Total Shoulder Arthroplasty, Cemented    Current Facility-Administered Medications  Medication Dose Route Frequency Provider Last Rate Last Dose  . acetaminophen (TYLENOL) tablet 1,000 mg  1,000 mg Oral Once Roderic Palau, MD      . chlorhexidine (HIBICLENS) 4 % liquid 4 application  60 mL Topical Once Lanae Crumbly, PA-C      . lactated ringers infusion   Intravenous Continuous Roderic Palau, MD 10 mL/hr at 09/08/18 1042    . vancomycin (VANCOCIN) 1,500 mg in sodium chloride  0.9 % 500 mL IVPB  1,500 mg Intravenous To OR Marybelle Killings, MD       Allergies  Allergen Reactions  . Novocain [Procaine Hcl] Other (See Comments)    Unknown-passed out with novocaine injected for dental surgery.  . Other Hives and Other (See Comments)    EKG pads - need to use pediatric pads  . Latex Rash  . Penicillins Rash and Other (See Comments)    Has patient had a PCN reaction causing immediate rash, facial/tongue/throat swelling, SOB or lightheadedness with hypotension: Yes Has patient had a PCN reaction causing severe rash involving mucus membranes or skin necrosis: No Has patient had a PCN reaction that required hospitalization No Has patient had a PCN reaction occurring within the last 10 years: No  If all of the above answers are "NO", then may proceed with Cephalosporin use.   . Sulfa Antibiotics Rash    Social History   Tobacco Use  . Smoking status: Never Smoker  . Smokeless tobacco: Never Used  Substance Use Topics  .  Alcohol use: No    Family History  Problem Relation Age of Onset  . Colon cancer Brother        IN HIS 60s-METASTATIC  . Colon polyps Paternal Grandfather   . Breast cancer Mother   . Hypertension Father   . Heart disease Father   . Hypertension Brother   . Heart disease Brother      Review of Systems  Constitutional: Negative.   HENT: Negative.   Respiratory: Positive for cough.   Cardiovascular: Negative.   Genitourinary: Negative.   Musculoskeletal: Positive for joint pain.  Skin: Negative.   Neurological: Negative.   Psychiatric/Behavioral: Negative.     Objective:  Physical Exam  Vital signs in last 24 hours: Temp:  [98.2 F (36.8 C)] 98.2 F (36.8 C) (07/06 1034) BP: (186)/(84) 186/84 (07/06 1034) SpO2:  [98 %] 98 % (07/06 1034)  Labs:   Estimated body mass index is 39.76 kg/m as calculated from the following:   Height as of 09/02/18: 5\' 3"  (1.6 m).   Weight as of 09/02/18: 101.8 kg.   Imaging Review Plain  radiographs demonstrate moderate degenerative joint disease of the right knee(s).  The bone quality appears to be good for age and reported activity level.      Assessment/Plan:  End stage arthritis, right knee   The patient history, physical examination, clinical judgment of the provider and imaging studies are consistent with end stage degenerative joint disease of the right knee(s) and total knee arthroplasty is deemed medically necessary. The treatment options including medical management, injection therapy arthroscopy and arthroplasty were discussed at length. The risks and benefits of total knee arthroplasty were presented and reviewed. The risks due to aseptic loosening, infection, stiffness, patella tracking problems, thromboembolic complications and other imponderables were discussed. The patient acknowledged the explanation, agreed to proceed with the plan and consent was signed. Patient is being admitted for inpatient treatment for surgery, pain control, PT, OT, prophylactic antibiotics, VTE prophylaxis, progressive ambulation and ADL's and discharge planning. The patient is planning to be discharged to skilled nursing facility    Anticipated LOS equal to or greater than 2 midnights due to - Age 37 and older with one or more of the following:  - Obesity  - Expected need for hospital services (PT, OT, Nursing) required for safe  discharge  - Anticipated need for postoperative skilled nursing care or inpatient rehab  - Active co-morbidities: Anemia OR   - Unanticipated findings during/Post Surgery: None  - Patient is a high risk of re-admission due to: None

## 2018-09-08 NOTE — Progress Notes (Signed)
Orthopedic Tech Progress Note Patient Details:  [name] [name] 12-Sep-1940 [id number]  CPM Right Knee CPM Right Knee: On     Melony Overly T 09/08/2018, 4:40 PM

## 2018-09-08 NOTE — Anesthesia Procedure Notes (Signed)
Anesthesia Regional Block: Adductor canal block   Pre-Anesthetic Checklist: ,, timeout performed, Correct Patient, Correct Site, Correct Laterality, Correct Procedure, Correct Position, site marked, Risks and benefits discussed, pre-op evaluation,  At surgeon's request and post-op pain management  Laterality: Right  Prep: Maximum Sterile Barrier Precautions used, chloraprep       Needles:  Injection technique: Single-shot  Needle Type: Echogenic Stimulator Needle     Needle Length: 9cm  Needle Gauge: 21     Additional Needles:   Procedures:,,,, ultrasound used (permanent image in chart),,,,  Narrative:  Start time: 09/08/2018 11:15 AM End time: 09/08/2018 11:25 AM Injection made incrementally with aspirations every 5 mL.  Performed by: Personally  Anesthesiologist: Roderic Palau, MD  Additional Notes: 2% Lidocaine skin wheel.

## 2018-09-08 NOTE — Anesthesia Procedure Notes (Addendum)
Procedure Name: Intubation Date/Time: 09/08/2018 1:48 PM Performed by: Harden Mo, CRNA Pre-anesthesia Checklist: Patient identified, Emergency Drugs available, Suction available and Patient being monitored Patient Re-evaluated:Patient Re-evaluated prior to induction Oxygen Delivery Method: Circle System Utilized Preoxygenation: Pre-oxygenation with 100% oxygen Induction Type: IV induction and Rapid sequence Laryngoscope Size: Miller and 2 Grade View: Grade I Tube type: Oral Tube size: 7.0 mm Number of attempts: 1 Airway Equipment and Method: Stylet and Oral airway Placement Confirmation: ETT inserted through vocal cords under direct vision,  positive ETCO2 and breath sounds checked- equal and bilateral Secured at: 21 cm Tube secured with: Tape Dental Injury: Teeth and Oropharynx as per pre-operative assessment

## 2018-09-08 NOTE — Op Note (Signed)
Pre-and postop diagnosis: Right knee primary osteoarthritis  Procedure: Right cemented total knee arthroplasty  Surgeon: Rodell Perna, MD  Assistant: Benjiman Core, PA-C medically necessary and present for the entire procedure    Anesthesia: Attempted spinal, converted to general anesthesia.  Tourniquet time 44 minutes x 350  EBL: Less than 150 cc  Implants: Attune #5 femur #5 tibial rotating platform 5 mm poly-rotating platform.  35 mm patella 3 peg. Depuy  Procedure: After attempted spinal converted to general proximal tourniquet was applied lateral post heel bump DuraPrep from the tourniquet to the tip the toes usual total knee sheets drapes were applied.  Sterile skin marker was used prior to Betadine Steri-Drape timeout procedure was completed vancomycin was given IV prophylactically due to penicillin allergy.  Tourniquet was inflated midline incision was made medial parapatellar incision patella was everted 10 mm resected off the patella spurs were removed.  There was tricompartmental degenerative arthritis with grade 4 changes eburnated bone marginal osteophytes.  Intramedullary hole drilled in the femur.  Chamfer cuts were made.  Resection of 10 mm off the femur 9 mm off the tibia.  5 mm spacer block gave full extension.  Keel preparation on the tibia box cut on the femur remove all remaining osteophytes meniscal remnants.  ACL PCL was resected.  Pulse lavage after trials.  Vacuum mixing of the cement cementing of the tibia followed by femur placement of permanent 5 mm poly-and 3 peg patella.  Cement was hardened 15 minutes all excessive cement had been removed tourniquet was deflated hemostasis obtained in standard closure #1 Vicryl in the retinaculum and patellar tendon that is been split between the middle third and lateral two thirds.  2-0 Vicryl subtenons tissue.  Skin staple closure postop dressing and knee immobilizer.

## 2018-09-09 ENCOUNTER — Encounter (HOSPITAL_COMMUNITY): Payer: Self-pay | Admitting: Orthopaedic Surgery

## 2018-09-09 DIAGNOSIS — M25761 Osteophyte, right knee: Secondary | ICD-10-CM | POA: Diagnosis present

## 2018-09-09 DIAGNOSIS — Z882 Allergy status to sulfonamides status: Secondary | ICD-10-CM | POA: Diagnosis not present

## 2018-09-09 DIAGNOSIS — Z90710 Acquired absence of both cervix and uterus: Secondary | ICD-10-CM | POA: Diagnosis not present

## 2018-09-09 DIAGNOSIS — Z981 Arthrodesis status: Secondary | ICD-10-CM | POA: Diagnosis not present

## 2018-09-09 DIAGNOSIS — K219 Gastro-esophageal reflux disease without esophagitis: Secondary | ICD-10-CM | POA: Diagnosis not present

## 2018-09-09 DIAGNOSIS — I10 Essential (primary) hypertension: Secondary | ICD-10-CM | POA: Diagnosis not present

## 2018-09-09 DIAGNOSIS — E669 Obesity, unspecified: Secondary | ICD-10-CM | POA: Diagnosis not present

## 2018-09-09 DIAGNOSIS — Z9884 Bariatric surgery status: Secondary | ICD-10-CM | POA: Diagnosis not present

## 2018-09-09 DIAGNOSIS — Z96652 Presence of left artificial knee joint: Secondary | ICD-10-CM | POA: Diagnosis present

## 2018-09-09 DIAGNOSIS — Z9049 Acquired absence of other specified parts of digestive tract: Secondary | ICD-10-CM | POA: Diagnosis not present

## 2018-09-09 DIAGNOSIS — Z8719 Personal history of other diseases of the digestive system: Secondary | ICD-10-CM | POA: Diagnosis not present

## 2018-09-09 DIAGNOSIS — Z1159 Encounter for screening for other viral diseases: Secondary | ICD-10-CM | POA: Diagnosis not present

## 2018-09-09 DIAGNOSIS — M19012 Primary osteoarthritis, left shoulder: Secondary | ICD-10-CM | POA: Diagnosis not present

## 2018-09-09 DIAGNOSIS — M1711 Unilateral primary osteoarthritis, right knee: Secondary | ICD-10-CM | POA: Diagnosis not present

## 2018-09-09 DIAGNOSIS — Z91040 Latex allergy status: Secondary | ICD-10-CM | POA: Diagnosis not present

## 2018-09-09 DIAGNOSIS — D638 Anemia in other chronic diseases classified elsewhere: Secondary | ICD-10-CM | POA: Diagnosis not present

## 2018-09-09 DIAGNOSIS — Z888 Allergy status to other drugs, medicaments and biological substances status: Secondary | ICD-10-CM | POA: Diagnosis not present

## 2018-09-09 DIAGNOSIS — J302 Other seasonal allergic rhinitis: Secondary | ICD-10-CM | POA: Diagnosis not present

## 2018-09-09 DIAGNOSIS — K5900 Constipation, unspecified: Secondary | ICD-10-CM | POA: Diagnosis not present

## 2018-09-09 DIAGNOSIS — Z6838 Body mass index (BMI) 38.0-38.9, adult: Secondary | ICD-10-CM | POA: Diagnosis not present

## 2018-09-09 DIAGNOSIS — Z96643 Presence of artificial hip joint, bilateral: Secondary | ICD-10-CM | POA: Diagnosis present

## 2018-09-09 DIAGNOSIS — Z880 Allergy status to penicillin: Secondary | ICD-10-CM | POA: Diagnosis not present

## 2018-09-09 DIAGNOSIS — Z96611 Presence of right artificial shoulder joint: Secondary | ICD-10-CM | POA: Diagnosis present

## 2018-09-09 DIAGNOSIS — F419 Anxiety disorder, unspecified: Secondary | ICD-10-CM | POA: Diagnosis not present

## 2018-09-09 LAB — CBC
HCT: 31.8 % — ABNORMAL LOW (ref 36.0–46.0)
Hemoglobin: 9.8 g/dL — ABNORMAL LOW (ref 12.0–15.0)
MCH: 28 pg (ref 26.0–34.0)
MCHC: 30.8 g/dL (ref 30.0–36.0)
MCV: 90.9 fL (ref 80.0–100.0)
Platelets: 140 10*3/uL — ABNORMAL LOW (ref 150–400)
RBC: 3.5 MIL/uL — ABNORMAL LOW (ref 3.87–5.11)
RDW: 14.3 % (ref 11.5–15.5)
WBC: 8.3 10*3/uL (ref 4.0–10.5)
nRBC: 0 % (ref 0.0–0.2)

## 2018-09-09 LAB — BASIC METABOLIC PANEL
Anion gap: 8 (ref 5–15)
BUN: 16 mg/dL (ref 8–23)
CO2: 26 mmol/L (ref 22–32)
Calcium: 8.4 mg/dL — ABNORMAL LOW (ref 8.9–10.3)
Chloride: 104 mmol/L (ref 98–111)
Creatinine, Ser: 0.91 mg/dL (ref 0.44–1.00)
GFR calc Af Amer: >60 mL/min (ref >60)
GFR calc non Af Amer: >60 mL/min (ref >60)
Glucose, Bld: 139 mg/dL — ABNORMAL HIGH (ref 70–99)
Potassium: 4.1 mmol/L (ref 3.5–5.1)
Sodium: 138 mmol/L (ref 135–145)

## 2018-09-09 MED ORDER — METOPROLOL SUCCINATE ER 25 MG PO TB24
25.0000 mg | ORAL_TABLET | Freq: Every day | ORAL | Status: DC
  Administered 2018-09-09 – 2018-09-10 (×2): 25 mg via ORAL
  Filled 2018-09-09 (×3): qty 1

## 2018-09-09 NOTE — Evaluation (Signed)
Physical Therapy Evaluation Patient Details Name: [name] [name] MRN: [id number] DOB: 11/27/1940 Today's Date: 09/09/2018   History of Present Illness  Pt is a 78 y.o. female s/p elective R TKA 09/08/18. PMH includes anxiety, depression, OA, hernia, HTN, obesity, back sx, R TSA (2014), R THA (1995), L THA (2008), L partial knee replacement.    Clinical Impression  Pt presents with an overall decrease in functional mobility secondary to above. PTA, pt mod indep with RW and lives alone with family available for PRN support. Educ on precautions, positioning, therex, and importance of mobility. Today, pt required minA for bed mobility; despite multiple attempts to transfer with maxA, pt unable to achieve fully upright standing. Pt limited by generalized weakness, R knee pain and anxiety regarding this pain; agreeable to trial lift equipment with assist +2 next session. Pt would benefit from continued acute PT services to maximize functional mobility and independence prior to d/c with SNF-level therapies; pt in agreement.     Follow Up Recommendations SNF;Follow surgeon's recommendation for DC plan and follow-up therapies    Equipment Recommendations  None recommended by PT    Recommendations for Other Services       Precautions / Restrictions Precautions Precautions: Knee Precaution Booklet Issued: Yes (comment) Required Braces or Orthoses: Knee Immobilizer - Right Knee Immobilizer - Right: (On except when in CPM/PT) Restrictions Weight Bearing Restrictions: Yes RLE Weight Bearing: Weight bearing as tolerated      Mobility  Bed Mobility Overal bed mobility: Needs Assistance Bed Mobility: Supine to Sit;Sit to Supine     Supine to sit: Min assist;HOB elevated Sit to supine: Min assist   General bed mobility comments: MinA to assist RLE; pt reliant on use of bed rail and momentum  Transfers Overall transfer level: Needs assistance Equipment used: Rolling walker (2 wheeled);1 person  hand held assist Transfers: Sit to/from Stand Sit to Stand: Max assist;From elevated surface         General transfer comment: Multiple attempts to stand from EOB to RW, pt unable to achieve fully upright standing despite maxA. Performed 3x partial standing with BUE support on therapist, but ultimately unable to translate weight onto BLEs to support self. Demonstrated use of Stedy which pt reports is willing to try next session  Ambulation/Gait             General Gait Details: Unable  Stairs            Wheelchair Mobility    Modified Rankin (Stroke Patients Only)       Balance Overall balance assessment: Needs assistance   Sitting balance-Leahy Scale: Fair       Standing balance-Leahy Scale: Zero                               Pertinent Vitals/Pain Pain Assessment: Faces Faces Pain Scale: Hurts little more Pain Location: R knee Pain Descriptors / Indicators: Operative site guarding Pain Intervention(s): Monitored during session    Home Living Family/patient expects to be discharged to:: Private residence Living Arrangements: Alone Available Help at Discharge: Family;Available PRN/intermittently Type of Home: Apartment Home Access: Level entry     Home Layout: One level Home Equipment: Walker - 2 wheels;Walker - 4 wheels;Cane - single point;Bedside commode Additional Comments: Lives in apartment that niece owns; niece works during day but could help pt if needed    Prior Function Level of Independence: Independent with assistive device(s)  Comments: Drives short distances. Ambulatory with rollator/RW, reports most difficulty with standing to get OOB first thing in the morning     Hand Dominance        Extremity/Trunk Assessment   Upper Extremity Assessment Upper Extremity Assessment: Generalized weakness    Lower Extremity Assessment Lower Extremity Assessment: Generalized weakness;RLE deficits/detail RLE Deficits /  Details: s/p R TKA; hip flex < 3/5       Communication   Communication: No difficulties  Cognition Arousal/Alertness: Awake/alert Behavior During Therapy: Anxious Overall Cognitive Status: Within Functional Limits for tasks assessed                                        General Comments General comments (skin integrity, edema, etc.): SpO2 90-94% on RA    Exercises     Assessment/Plan    PT Assessment Patient needs continued PT services  PT Problem List Decreased strength;Decreased range of motion;Decreased activity tolerance;Decreased balance;Decreased mobility;Decreased knowledge of use of DME;Decreased knowledge of precautions;Pain       PT Treatment Interventions DME instruction;Gait training;Functional mobility training;Therapeutic activities;Therapeutic exercise;Balance training;Patient/family education    PT Goals (Current goals can be found in the Care Plan section)  Acute Rehab PT Goals Patient Stated Goal: Agreeable to SNF before return home PT Goal Formulation: With patient Time For Goal Achievement: 09/23/18 Potential to Achieve Goals: Good    Frequency 7X/week   Barriers to discharge Decreased caregiver support      Co-evaluation               AM-PAC PT "6 Clicks" Mobility  Outcome Measure Help needed turning from your back to your side while in a flat bed without using bedrails?: A Little Help needed moving from lying on your back to sitting on the side of a flat bed without using bedrails?: A Little Help needed moving to and from a bed to a chair (including a wheelchair)?: A Lot Help needed standing up from a chair using your arms (e.g., wheelchair or bedside chair)?: A Lot Help needed to walk in hospital room?: A Lot Help needed climbing 3-5 steps with a railing? : Total 6 Click Score: 13    End of Session Equipment Utilized During Treatment: Gait belt Activity Tolerance: Patient tolerated treatment well Patient left: in  bed;with call bell/phone within reach;with bed alarm set Nurse Communication: Mobility status PT Visit Diagnosis: Other abnormalities of gait and mobility (R26.89);Muscle weakness (generalized) (M62.81)    Time: 1761-6073 PT Time Calculation (min) (ACUTE ONLY): 39 min   Charges:   PT Evaluation $PT Eval Moderate Complexity: 1 Mod PT Treatments $Therapeutic Activity: 23-37 mins   Mabeline Caras, PT, DPT Acute Rehabilitation Services  Pager [phone] Office [place] 09/09/2018, 9:56 AM

## 2018-09-09 NOTE — Progress Notes (Signed)
Physical Therapy Treatment Patient Details Name: [name] [name] MRN: [id number] DOB: May 10, 1940 Today's Date: 09/09/2018    History of Present Illness Pt is a 78 y.o. female s/p elective R TKA 09/08/18. PMH includes anxiety, depression, OA, hernia, HTN, obesity, back sx, R TSA (2014), R THA (1995), L THA (2008), L partial knee replacement.   PT Comments    Pt able to transfer to recliner with Stedy lift and modA+2, demonstrating improved ability to bear weight through RLE, although still limited by significant pain. SpO2 down to 89% on RA, returning to >90% on O2 [place]. Recommend use of Stedy for transfers with nursing staff (RN notified.   Follow Up Recommendations  SNF;Follow surgeon's recommendation for DC plan and follow-up therapies     Equipment Recommendations  None recommended by PT    Recommendations for Other Services       Precautions / Restrictions Precautions Precautions: Knee Required Braces or Orthoses: Knee Immobilizer - Right Knee Immobilizer - Right: (on except in CPM/PT) Restrictions Weight Bearing Restrictions: Yes RLE Weight Bearing: Weight bearing as tolerated    Mobility  Bed Mobility Overal bed mobility: Needs Assistance Bed Mobility: Supine to Sit     Supine to sit: Min assist;HOB elevated     General bed mobility comments: MinA to assist RLE; pt reliant on use of bed rail, good ability to use LLE to assist  Transfers Overall transfer level: Needs assistance   Transfers: Sit to/from Stand Sit to Stand: Mod assist;+2 safety/equipment         General transfer comment: ModA to assist standing into bariatric stedy frame, pt able to assist well with BUEs. ModA for eccentric control, pt limited by R knee pain with increased flexion. Declined additional attempt to stand from chair with RW  Ambulation/Gait                 Stairs             Wheelchair Mobility    Modified Rankin (Stroke Patients Only)       Balance Overall  balance assessment: Needs assistance   Sitting balance-Leahy Scale: Fair       Standing balance-Leahy Scale: Zero                              Cognition Arousal/Alertness: Awake/alert Behavior During Therapy: Flat affect Overall Cognitive Status: Within Functional Limits for tasks assessed                                        Exercises      General Comments General comments (skin integrity, edema, etc.): SpO2 down to 89% on RA, returning to >94% on O2 [place]      Pertinent Vitals/Pain Pain Assessment: Faces Faces Pain Scale: Hurts even more Pain Location: R knee Pain Descriptors / Indicators: Operative site guarding;Grimacing Pain Intervention(s): Monitored during session    Home Living                      Prior Function            PT Goals (current goals can now be found in the care plan section) Acute Rehab PT Goals Patient Stated Goal: Agreeable to SNF before return home PT Goal Formulation: With patient Time For Goal Achievement: 09/23/18 Potential to Achieve Goals: Good Progress towards  PT goals: Progressing toward goals    Frequency    7X/week      PT Plan Current plan remains appropriate    Co-evaluation              AM-PAC PT "6 Clicks" Mobility   Outcome Measure  Help needed turning from your back to your side while in a flat bed without using bedrails?: A Little Help needed moving from lying on your back to sitting on the side of a flat bed without using bedrails?: A Little Help needed moving to and from a bed to a chair (including a wheelchair)?: A Lot Help needed standing up from a chair using your arms (e.g., wheelchair or bedside chair)?: A Lot Help needed to walk in hospital room?: Total Help needed climbing 3-5 steps with a railing? : Total 6 Click Score: 12    End of Session Equipment Utilized During Treatment: Gait belt Activity Tolerance: Patient tolerated treatment well Patient left: in  chair;with call bell/phone within reach Nurse Communication: Mobility status PT Visit Diagnosis: Other abnormalities of gait and mobility (R26.89);Muscle weakness (generalized) (M62.81)     Time: 2162-4469 PT Time Calculation (min) (ACUTE ONLY): 22 min  Charges:  $Therapeutic Activity: 8-22 mins                    Mabeline Caras, PT, DPT Acute Rehabilitation Services  Pager [phone] Office [place] 09/09/2018, 5:39 PM

## 2018-09-09 NOTE — Anesthesia Postprocedure Evaluation (Signed)
Anesthesia Post Note  Patient: [name] [name]  Procedure(s) Performed: RIGHT TOTAL KNEE ARTHROPLASTY (Right Knee)     Patient location during evaluation: PACU Anesthesia Type: General Level of consciousness: awake and alert Pain management: pain level controlled Vital Signs Assessment: post-procedure vital signs reviewed and stable Respiratory status: spontaneous breathing, nonlabored ventilation, respiratory function stable and patient connected to nasal cannula oxygen Cardiovascular status: blood pressure returned to baseline and stable Postop Assessment: no apparent nausea or vomiting Anesthetic complications: no    Last Vitals:  Vitals:   09/09/18 0524 09/09/18 0812  BP: (!) 132/55 (!) 158/69  Pulse: 73 79  Resp: 17 16  Temp: 36.6 C 37 C  SpO2: 99% 97%    Last Pain:  Vitals:   09/09/18 0840  TempSrc:   PainSc: 5                  Nyellie Yetter S

## 2018-09-09 NOTE — Progress Notes (Addendum)
   Subjective: 1 Day Post-Op Procedure(s) (LRB): RIGHT TOTAL KNEE ARTHROPLASTY (Right) Patient reports pain as moderate.    Objective: Vital signs in last 24 hours: Temp:  [97 F (36.1 C)-98.6 F (37 C)] 97.9 F (36.6 C) (07/07 0524) Pulse Rate:  [71-88] 73 (07/07 0524) Resp:  [12-21] 17 (07/07 0524) BP: (106-209)/(54-160) 132/55 (07/07 0524) SpO2:  [92 %-100 %] 99 % (07/07 0524) Weight:  [101.8 kg] 101.8 kg (07/06 1034)  Intake/Output from previous day: 07/06 0701 - 07/07 0700 In: 2757 [P.O.:100; I.V.:2407; IV Piggyback:200] Out: 100 [Blood:100] Intake/Output this shift: No intake/output data recorded.  Recent Labs    09/09/18 0341  HGB 9.8*   Recent Labs    09/09/18 0341  WBC 8.3  RBC 3.50*  HCT 31.8*  PLT 140*   Recent Labs    09/09/18 0341  NA 138  K 4.1  CL 104  CO2 26  BUN 16  CREATININE 0.91  GLUCOSE 139*  CALCIUM 8.4*   No results for input(s): LABPT, INR in the last 72 hours.  Neurologically intact Dg Knee 1-2 Views Right  Result Date: 09/08/2018 CLINICAL DATA:  RIGHT knee replacement surgery today EXAM: RIGHT KNEE - 1-2 VIEW COMPARISON:  Portable exam 1616 hours compared to preoperative radiographs of 04/02/2017 FINDINGS: Osseous demineralization. Components of a RIGHT knee prosthesis are identified. No fracture, dislocation, bone destruction or periprosthetic lucency. Anterior skin clips and soft tissue swelling. IMPRESSION: RIGHT knee prosthesis without acute complication. Electronically Signed   By: Lavonia Dana M.D.   On: 09/08/2018 16:51    Assessment/Plan: 1 Day Post-Op Procedure(s) (LRB): RIGHT TOTAL KNEE ARTHROPLASTY (Right) Up with therapy, dressing change.   [name] [name] 09/09/2018, 7:37 AM  Addendum:  Pt needed minA for bed mobility , despite multiple attempts to trasfer with maxA, pt unable to achieve fully upright standing.  Slow progress with PT and SNF recommended due to general weakness.   This is per PT note.

## 2018-09-09 NOTE — Progress Notes (Signed)
Orthopedic Tech Progress Note Patient Details:  [name] [name] [birth year] [id number]  Patient ID: [name] [name], female   DOB: November 19, 1940, 78 y.o.   MRN: [id number] Pt will call when ready to get on cpm  [name] 09/09/2018, 6:37 AM

## 2018-09-09 NOTE — Anesthesia Postprocedure Evaluation (Deleted)
Anesthesia Post Note  Patient: [name] [name]  Procedure(s) Performed: RIGHT TOTAL KNEE ARTHROPLASTY (Right Knee)     Patient location during evaluation: PACU Anesthesia Type: Spinal Level of consciousness: oriented and awake and alert Pain management: pain level controlled Vital Signs Assessment: post-procedure vital signs reviewed and stable Respiratory status: spontaneous breathing, respiratory function stable and patient connected to nasal cannula oxygen Cardiovascular status: blood pressure returned to baseline and stable Postop Assessment: no headache, no backache and no apparent nausea or vomiting Anesthetic complications: no    Last Vitals:  Vitals:   09/09/18 0524 09/09/18 0812  BP: (!) 132/55 (!) 158/69  Pulse: 73 79  Resp: 17 16  Temp: 36.6 C 37 C  SpO2: 99% 97%    Last Pain:  Vitals:   09/09/18 0840  TempSrc:   PainSc: 5                  Random Dobrowski S

## 2018-09-09 NOTE — Progress Notes (Signed)
[facility] PROGRESS NOTE  IDAMAY HOSEIN GMW:[id number] DOB: 1940/08/29 DOA: 09/08/2018 PCP: Curlene Labrum, MD  P Hypertension Blood pressure remains elevated despite resumption of previous medication lisinopril HCTZ therefore added metoprolol If trends are about 150, we can probably let her primary care physician see the patient in the outpatient setting and adjust-usually lisinopril/HCTZ take about 5 to 7 days to exert its effect She should also be counseled regarding a low-salt diet  Expected blood loss anemia Cycle trends transfusion threshold 7   The rest of her medical issues including bipolar, reflux, cataracts etc. etc. are pretty stable  We will follow for now however probably will sign off next 24 hours dependent on blood pressure trends-defer to surgery weightbearing status and other postop routine care   --- Synopsis 78 year old Caucasian female multiple colonic adenomas, HTN, bipolar, Barrett's esophagus 2016, moderate obesity, multiple orthopedic surgeries and cataracts Gastric bypass Prior choledocholithiasis and ERCP at [facility] by Dr. Delrae Alfred Presented for routine knee surgery but found to be hypertensive subsequently Consulted by orthopedics for management of blood pressure    DVT Lovenox Code Status: Full code Communication: No family Disposition Plan: Probable skilled facility as per orthopedics   Verlon Au, MD  [facility] Via [employer] app OR [email or web address] 7PM-7AM contact night coverage as above 09/09/2018, 2:53 PM  LOS: 0 days  --- --- Today Awake alert coherent eating drinking  O  Vitals:  Vitals:   09/09/18 0524 09/09/18 0812  BP: (!) 132/55 (!) 158/69  Pulse: 73 79  Resp: 17 16  Temp: 97.9 F (36.6 C) 98.6 F (37 C)  SpO2: 99% 97%    Exam:  Pleasant looking older than stated age no icterus no pallor thick neck Edentulous Chest clear Y4-I3 holosystolic murmur 2/6 Abdomen soft No lower extremity edema   I have  personally reviewed the following:   DATA BUN/creatinine 16/0.9 Hemoglobin down from 13.1-9.8    Scheduled Meds: . aspirin EC  325 mg Oral Q breakfast  . bupivacaine liposome  20 mL Infiltration Once  . cholecalciferol  2,000 Units Oral BID  . docusate sodium  100 mg Oral BID  . hydrochlorothiazide  12.5 mg Oral Daily  . lisinopril  20 mg Oral Daily  . metoprolol succinate  25 mg Oral Daily  . pantoprazole  40 mg Oral Daily  . potassium chloride  10 mEq Oral Daily  . sertraline  100 mg Oral Daily  . traZODone  50 mg Oral QHS   Continuous Infusions: . sodium chloride 85 mL/hr at 09/08/18 1742  . lactated ringers 10 mL/hr at 09/08/18 1042  . methocarbamol (ROBAXIN) IV      Principal Problem:   Primary hypertension Active Problems:   GERD (gastroesophageal reflux disease)   Unilateral primary osteoarthritis, right knee   Arthritis of right knee   LOS: 0 days

## 2018-09-10 LAB — CBC
HCT: 29.4 % — ABNORMAL LOW (ref 36.0–46.0)
Hemoglobin: 9.2 g/dL — ABNORMAL LOW (ref 12.0–15.0)
MCH: 27.9 pg (ref 26.0–34.0)
MCHC: 31.3 g/dL (ref 30.0–36.0)
MCV: 89.1 fL (ref 80.0–100.0)
Platelets: 120 10*3/uL — ABNORMAL LOW (ref 150–400)
RBC: 3.3 MIL/uL — ABNORMAL LOW (ref 3.87–5.11)
RDW: 14.2 % (ref 11.5–15.5)
WBC: 8.8 10*3/uL (ref 4.0–10.5)
nRBC: 0 % (ref 0.0–0.2)

## 2018-09-10 MED ORDER — ASPIRIN 325 MG PO TBEC: 325.0000 mg | DELAYED_RELEASE_TABLET | Freq: Every day | ORAL | 0 refills | Status: DC

## 2018-09-10 MED ORDER — HYDROCODONE-ACETAMINOPHEN 7.5-325 MG PO TABS: 1.0000 | ORAL_TABLET | Freq: Four times a day (QID) | ORAL | 0 refills | Status: DC | PRN

## 2018-09-10 NOTE — Progress Notes (Signed)
   Subjective: 2 Days Post-Op Procedure(s) (LRB): RIGHT TOTAL KNEE ARTHROPLASTY (Right) Patient reports pain as moderate.    Objective: Vital signs in last 24 hours: Temp:  [98.2 F (36.8 C)-99.5 F (37.5 C)] 98.2 F (36.8 C) (07/08 1257) Pulse Rate:  [79-100] 89 (07/08 1257) Resp:  [12-18] 18 (07/08 0928) BP: (101-138)/(52-78) 104/53 (07/08 1257) SpO2:  [89 %-98 %] 98 % (07/08 1257)  Intake/Output from previous day: 07/07 0701 - 07/08 0700 In: 480 [P.O.:480] Out: 400 [Urine:400] Intake/Output this shift: Total I/O In: 720 [P.O.:720] Out: -   Recent Labs    09/09/18 0341 09/10/18 0317  HGB 9.8* 9.2*   Recent Labs    09/09/18 0341 09/10/18 0317  WBC 8.3 8.8  RBC 3.50* 3.30*  HCT 31.8* 29.4*  PLT 140* 120*   Recent Labs    09/09/18 0341  NA 138  K 4.1  CL 104  CO2 26  BUN 16  CREATININE 0.91  GLUCOSE 139*  CALCIUM 8.4*   No results for input(s): LABPT, INR in the last 72 hours.  Neurologically intact No results found.  Assessment/Plan: 2 Days Post-Op Procedure(s) (LRB): RIGHT TOTAL KNEE ARTHROPLASTY (Right) Up with therapy, SNF [facility] tomorrow. COVID test ordered.   Marybelle Killings 09/10/2018, 4:22 PM

## 2018-09-10 NOTE — Progress Notes (Signed)
Physical Therapy Treatment Patient Details Name: [name] [name] MRN: [id number] DOB: 02-Dec-1940 Today's Date: 09/10/2018    History of Present Illness Pt is a 78 y.o. female s/p elective R TKA 09/08/18. PMH includes anxiety, depression, OA, hernia, HTN, obesity, back sx, R TSA (2014), R THA (1995), L THA (2008), L partial knee replacement.    PT Comments    Pt progressing well this pm.  Pt able to progress to standing with RW and progression to gait training.  Pt required moderate to min assistance and close chair follow for gt progression.  Plan remains appropriate for short term snf placement.  Pt sats 92% on RA sitting upright in recliner.  RN informed and patient left on RA.     Follow Up Recommendations  SNF;Follow surgeon's recommendation for DC plan and follow-up therapies     Equipment Recommendations  None recommended by PT    Recommendations for Other Services       Precautions / Restrictions Precautions Precautions: Knee Precaution Booklet Issued: Yes (comment) Required Braces or Orthoses: Knee Immobilizer - Right Restrictions Weight Bearing Restrictions: No RLE Weight Bearing: Weight bearing as tolerated    Mobility  Bed Mobility Overal bed mobility: Needs Assistance Bed Mobility: Supine to Sit     Supine to sit: Min assist     General bed mobility comments: Min assistance to advance R LE to edge of bed.  Transfers Overall transfer level: Needs assistance Equipment used: Rolling walker (2 wheeled) Transfers: Sit to/from Stand Sit to Stand: Mod assist;+2 safety/equipment         General transfer comment: Mod assistance from elevated surface to achieve standing.  Ambulation/Gait Ambulation/Gait assistance: Min assist;+2 safety/equipment Gait Distance (Feet): 12 Feet Assistive device: Rolling walker (2 wheeled) Gait Pattern/deviations: Step-to pattern;Antalgic;Trunk flexed     General Gait Details: Cues for sequencing, RW safety and upper trunk control.   Close chair follow utilized for safety.   Stairs             Wheelchair Mobility    Modified [name] (Stroke Patients Only)       Balance Overall balance assessment: Needs assistance   Sitting balance-Leahy Scale: Fair       Standing balance-Leahy Scale: Poor                              Cognition Arousal/Alertness: Awake/alert Behavior During Therapy: Flat affect Overall Cognitive Status: Within Functional Limits for tasks assessed                                        Exercises Total Joint Exercises Goniometric ROM: 10-65 degree R knee. General Exercises - Lower Extremity Ankle Circles/Pumps: AROM;Both;10 reps;Supine Quad Sets: AROM;Right;10 reps;Supine Heel Slides: AAROM;Right;10 reps;Supine Hip ABduction/ADduction: AAROM;Right;10 reps;Supine Straight Leg Raises: AAROM;Right;10 reps;Supine    General Comments        Pertinent Vitals/Pain Pain Assessment: Faces Faces Pain Scale: Hurts even more Pain Location: R knee Pain Descriptors / Indicators: Operative site guarding;Grimacing Pain Intervention(s): Monitored during session;Repositioned    Home Living                      Prior Function            PT Goals (current goals can now be found in the care plan section) Acute Rehab PT Goals Patient  Stated Goal: Agreeable to SNF before return home PT Goal Formulation: With patient Potential to Achieve Goals: Good Progress towards PT goals: Progressing toward goals    Frequency    7X/week      PT Plan Current plan remains appropriate    Co-evaluation              AM-PAC PT "6 Clicks" Mobility   Outcome Measure  Help needed turning from your back to your side while in a flat bed without using bedrails?: A Little Help needed moving from lying on your back to sitting on the side of a flat bed without using bedrails?: A Little Help needed moving to and from a bed to a chair (including a  wheelchair)?: A Lot Help needed standing up from a chair using your arms (e.g., wheelchair or bedside chair)?: A Lot Help needed to walk in hospital room?: A Little Help needed climbing 3-5 steps with a railing? : A Lot 6 Click Score: 15    End of Session Equipment Utilized During Treatment: Gait belt Activity Tolerance: Patient tolerated treatment well Patient left: in chair;with call bell/phone within reach Nurse Communication: Mobility status PT Visit Diagnosis: Other abnormalities of gait and mobility (R26.89);Muscle weakness (generalized) (M62.81)     Time: 9311-2162 PT Time Calculation (min) (ACUTE ONLY): 27 min  Charges:  $Gait Training: 8-22 mins $Therapeutic Exercise: 8-22 mins                     [name] [name], PTA Acute Rehabilitation Services Pager [phone] Office [phone]     [name] [name] [name] [name] 09/10/2018, 3:48 PM

## 2018-09-10 NOTE — Progress Notes (Signed)
The patient has been in touch with the contact person, Benjamine Mola [phone].  The nurse was at the bedside during update.  She has been called numerous times via the patient.

## 2018-09-10 NOTE — TOC Initial Note (Signed)
Transition of Care [facility]) - Initial/Assessment Note    Patient Details  Name: [name] [name] MRN: [id number] Date of Birth: 1941-02-20  [facility]) CM/SW Contact:    Alberteen Sam, [place] Phone Number: [phone] 09/10/2018, 2:40 PM  Clinical Narrative:                  CSW consulted with patient regarding discharge planning and SNF recommendation. Patient reports she is agreeable to rec and has preference for [facility] as she has been there in the past. CSW informed of insurance authorization needed.   CSW contacted [facility] who reports they will start [facility] insurance authorization today.   Expected Discharge Plan: Skilled Nursing Facility Barriers to Discharge: Continued Medical Work up   Patient Goals and CMS Choice Patient states their goals for this hospitalization and ongoing recovery are:: to go to rehab then home CMS Medicare.gov Compare Post Acute Care list provided to:: Patient Choice offered to / list presented to : Patient  Expected Discharge Plan and Services Expected Discharge Plan: [place] Acute Care Choice: [place] Living arrangements for the past 2 months: Single Family Home Expected Discharge Date: 09/11/18                                    Prior Living Arrangements/Services Living arrangements for the past 2 months: Single Family Home Lives with:: Self Patient language and need for interpreter reviewed:: Yes Do you feel safe going back to the place where you live?: No   needs short term rehab before returning home  Need for Family Participation in Patient Care: No (Comment) Care giver support system in place?: Yes (comment)   Criminal Activity/Legal Involvement Pertinent to Current Situation/Hospitalization: No - Comment as needed  Activities of Daily Living Home Assistive Devices/Equipment: Cane (specify quad or straight), Eyeglasses, Walker (specify type) ADL Screening  (condition at time of admission) Patient's cognitive ability adequate to safely complete daily activities?: Yes Is the patient deaf or have difficulty hearing?: No Does the patient have difficulty seeing, even when wearing glasses/contacts?: No Does the patient have difficulty concentrating, remembering, or making decisions?: No Patient able to express need for assistance with ADLs?: Yes Does the patient have difficulty dressing or bathing?: No Independently performs ADLs?: Yes (appropriate for developmental age) Does the patient have difficulty walking or climbing stairs?: Yes Weakness of Legs: Both Weakness of Arms/Hands: Both  Permission Sought/Granted Permission sought to share information with : Case Manager, Customer service manager, Family Supports    Share Information with NAME: Colletta [place]  Permission granted to share info w AGENCY: SNFs  Permission granted to share info w Relationship: niece  Permission granted to share info w Contact Information: [phone]  Emotional Assessment Appearance:: Appears stated age Attitude/Demeanor/Rapport: Gracious Affect (typically observed): Calm Orientation: : Oriented to Self, Oriented to Place, Oriented to  Time, Oriented to Situation Alcohol / Substance Use: Not Applicable Psych Involvement: No (comment)  Admission diagnosis:  right knee osteoarthritis Patient Active Problem List   Diagnosis Date Noted  . Arthritis of right knee 09/08/2018  . Unilateral primary osteoarthritis, right knee 08/14/2018  . Primary osteoarthritis, 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  . History of arthroplasty of right shoulder 08/14/2018  . History of colonic polyps 08/14/2017  . Choledocholithiasis   . Abnormal CT scan, esophagus   . Cholestasis 02/05/2017  . Depression 02/05/2017  .  Hypomagnesemia 02/05/2017  . Bradycardia 02/05/2017  . Transaminitis 07/08/2016  . Constipation 07/08/2016  . GIB (gastrointestinal bleeding) 07/08/2016  . HCAP  (healthcare-associated pneumonia) 09/23/2015  . AKI (acute kidney injury) ([place]) 07/21/2015  . Hypokalemia 07/21/2015  . CAP (community acquired pneumonia) 07/14/2015  . Upper abdominal pain 06/10/2014  . Folliculitis of perineum 02/24/2014  . Giant comedone 12/21/2013  . 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 Acrochordon 11/25/2013  . Dysphagia 10/29/2013  . Class 2 severe obesity due to excess calories with serious comorbidity and body mass index (BMI) of 38.0 to 38.9 in adult ([place]) 10/02/2013  . Primary hypertension   . GERD (gastroesophageal reflux disease)   . Anxiety   . Anemia of chronic disease    PCP:  Curlene Labrum, MD Pharmacy:   [facility], [address] [place] [address] Phone: [phone] Fax: [phone]     Social Determinants of Health (SDOH) Interventions    Readmission Risk Interventions No flowsheet data found.

## 2018-09-10 NOTE — Progress Notes (Signed)
[facility] requesting SARS rapid COVID test. They are starting insurance auth today with [facility]. CSW made Dr. Lorin Mercy aware and requested order.   [place], [place]

## 2018-09-10 NOTE — Discharge Instructions (Signed)
INSTRUCTIONS AFTER  TOTAL KNEE JOINT REPLACEMENT   o Remove items at home which could result in a fall. This includes throw rugs or furniture in walking pathways o ICE to the affected joint every three hours while awake for 30 minutes at a time, for at least the first 3-5 days, and then as needed for pain and swelling.  Continue to use ice for pain and swelling. You may notice swelling that will progress down to the foot and ankle.  This is normal after surgery.  Elevate your leg when you are not up walking on it.   o Continue to use the breathing machine you got in the hospital (incentive spirometer) which will help keep your temperature down.  It is common for your temperature to cycle up and down following surgery, especially at night when you are not up moving around and exerting yourself.  The breathing machine keeps your lungs expanded and your temperature down.   DIET:  As you were doing prior to hospitalization, we recommend a well-balanced diet.  DRESSING / WOUND CARE / SHOWERING  You may change your dressing 3-5 days after surgery.  Then change the dressing every day with sterile gauze.  Please use good hand washing techniques before changing the dressing.  Do not use any lotions or creams on the incision until instructed by your surgeon. OK TO SHOWER BUT NO TUB SOAKING.  DO NOT APPLY ANY CREAMS OR OINTMENTS TO INCISION. RN TO PERFORM DAILY WOUND CHECKS.  IF ANY QUESTIONS CONTACT OUR OFFICE.  ACTIVITY  o Increase activity slowly as tolerated, but follow the weight bearing instructions below.   o No driving for 6 weeks or until further direction given by your physician.  You cannot drive while taking narcotics.  o No lifting or carrying greater than 10 lbs. until further directed by your surgeon. o Avoid periods of inactivity such as sitting longer than an hour when not asleep. This helps prevent blood clots.  o You may return to work once you are authorized by your doctor.      WEIGHT BEARING   Weight bearing as tolerated with assist device (walker, cane, etc) as directed, use it as long as suggested by your surgeon or therapist, typically at least 4-6 weeks.   EXERCISES  Results after joint replacement surgery are often greatly improved when you follow the exercise, range of motion and muscle strengthening exercises prescribed by your doctor. Safety measures are also important to protect the joint from further injury. Any time any of these exercises cause you to have increased pain or swelling, decrease what you are doing until you are comfortable again and then slowly increase them. If you have problems or questions, call your caregiver or physical therapist for advice.   Rehabilitation is important following a joint replacement. After just a few days of immobilization, the muscles of the leg can become weakened and shrink (atrophy).  These exercises are designed to build up the tone and strength of the thigh and leg muscles and to improve motion. Often times heat used for twenty to thirty minutes before working out will loosen up your tissues and help with improving the range of motion but do not use heat for the first two weeks following surgery (sometimes heat can increase post-operative swelling).   These exercises can be done on a training (exercise) mat, on the floor, on a table or on a bed. Use whatever works the best and is most comfortable for you.  Use music or television while you are exercising so that the exercises are a pleasant break in your day. This will make your life better with the exercises acting as a break in your routine that you can look forward to.   Perform all exercises about fifteen times, three times per day or as directed.  You should exercise both the operative leg and the other leg as well.  Exercises include:    Quad Sets - Tighten up the muscle on the front of the thigh (Quad) and hold for 5-10 seconds.    Straight Leg Raises  - With your knee straight (if you were given a brace, keep it on), lift the leg to 60 degrees, hold for 3 seconds, and slowly lower the leg.  Perform this exercise against resistance later as your leg gets stronger.   Leg Slides: Lying on your back, slowly slide your foot toward your buttocks, bending your knee up off the floor (only go as far as is comfortable). Then slowly slide your foot back down until your leg is flat on the floor again.   Angel Wings: Lying on your back spread your legs to the side as far apart as you can without causing discomfort.   Hamstring Strength:  Lying on your back, push your heel against the floor with your leg straight by tightening up the muscles of your buttocks.  Repeat, but this time bend your knee to a comfortable angle, and push your heel against the floor.  You may put a pillow under the heel to make it more comfortable if necessary.   A rehabilitation program following joint replacement surgery can speed recovery and prevent re-injury in the future due to weakened muscles. Contact your doctor or a physical therapist for more information on knee rehabilitation.    CONSTIPATION  Constipation is defined medically as fewer than three stools per week and severe constipation as less than one stool per week.  Even if you have a regular bowel pattern at home, your normal regimen is likely to be disrupted due to multiple reasons following surgery.  Combination of anesthesia, postoperative narcotics, change in appetite and fluid intake all can affect your bowels.   YOU MUST use at least one of the following options; they are listed in order of increasing strength to get the job done.  They are all available over the counter, and you may need to use some, POSSIBLY even all of these options:    Drink plenty of fluids (prune juice may be helpful) and high fiber foods Colace 100 mg by mouth twice a day  Senokot for constipation as directed and as needed Dulcolax  (bisacodyl), take with full glass of water  Miralax (polyethylene glycol) once or twice a day as needed.  If you have tried all these things and are unable to have a bowel movement in the first 3-4 days after surgery call either your surgeon or your primary doctor.    If you experience loose stools or diarrhea, hold the medications until you stool forms back up.  If your symptoms do not get better within 1 week or if they get worse, check with your doctor.  If you experience "the worst abdominal pain ever" or develop nausea or vomiting, please contact the office immediately for further recommendations for treatment.   ITCHING:  If you experience itching with your medications, try taking only a single pain pill, or even half a pain pill at a time.  You can also  use Benadryl over the counter for itching or also to help with sleep.   TED HOSE STOCKINGS:  Use stockings on both legs until for at least 2 weeks or as directed by physician office. They may be removed at night for sleeping.  MEDICATIONS:  See your medication summary on the After Visit Summary that nursing will review with you.  You may have some home medications which will be placed on hold until you complete the course of blood thinner medication.  It is important for you to complete the blood thinner medication as prescribed.  PRECAUTIONS:  If you experience chest pain or shortness of breath - call 911 immediately for transfer to the hospital emergency department.   If you develop a fever greater that 101 F, purulent drainage from wound, increased redness or drainage from wound, foul odor from the wound/dressing, or calf pain - CONTACT YOUR SURGEON.                                                   FOLLOW-UP APPOINTMENTS:  If you do not already have a post-op appointment, please call the office for an appointment to be seen by your surgeon.  Guidelines for how soon to be seen are listed in your After Visit Summary, but are typically  between 1-4 weeks after surgery.  OTHER INSTRUCTIONS:   Knee Replacement:  Do not place pillow under knee, focus on keeping the knee straight while resting. CPM instructions: 0-90 degrees, 2 hours in the morning, 2 hours in the afternoon, and 2 hours in the evening. Place foam block, curve side up under heel at all times except when in CPM or when walking.  DO NOT modify, tear, cut, or change the foam block in any way.  MAKE SURE YOU:   Understand these instructions.   Get help right away if you are not doing well or get worse.    Thank you for letting us be a part of your medical care team.  It is a privilege we respect greatly.  We hope these instructions will help you stay on track for a fast and full recovery!

## 2018-09-10 NOTE — NC FL2 (Signed)
[place] LEVEL OF CARE SCREENING TOOL     IDENTIFICATION  Patient Name: [name] [name] Birthdate: 06-Mar-1940 Sex: female Admission Date (Current Location): 09/08/2018  [facility] and [place] Number:  [employer] and Address:  [facility] [place] [place], [place] [address], [place], [place] [address]      Provider Number: [id number]  Attending Physician Name and Address:  Marybelle Killings, MD  Relative Name and Phone Number:  Colletta [place] [phone]    Current Level of Care: Hospital Recommended Level of Care: [place] Prior Approval Number:    Date Approved/Denied:   PASRR Number: [id number]  Discharge Plan: SNF    Current Diagnoses: Patient Active Problem List   Diagnosis Date Noted  . Arthritis of right knee 09/08/2018  . Unilateral primary osteoarthritis, right knee 08/14/2018  . Primary osteoarthritis, 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  . History of arthroplasty of right shoulder 08/14/2018  . History of colonic polyps 08/14/2017  . Choledocholithiasis   . Abnormal CT scan, esophagus   . Cholestasis 02/05/2017  . Depression 02/05/2017  . Hypomagnesemia 02/05/2017  . Bradycardia 02/05/2017  . Transaminitis 07/08/2016  . Constipation 07/08/2016  . GIB (gastrointestinal bleeding) 07/08/2016  . HCAP (healthcare-associated pneumonia) 09/23/2015  . AKI (acute kidney injury) ([place]) 07/21/2015  . Hypokalemia 07/21/2015  . CAP (community acquired pneumonia) 07/14/2015  . Upper abdominal pain 06/10/2014  . Folliculitis of perineum 02/24/2014  . Giant comedone 12/21/2013  . 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 Acrochordon 11/25/2013  . Dysphagia 10/29/2013  . Class 2 severe obesity due to excess calories with serious comorbidity and body mass index (BMI) of 38.0 to 38.9 in adult ([place]) 10/02/2013  . Primary hypertension   . GERD (gastroesophageal reflux disease)   . Anxiety   . Anemia of chronic disease      Orientation RESPIRATION BLADDER Height & Weight     Self, Time, Situation, Place  O2(nasal cannula 2L/min) Continent Weight: 224 lb 6.9 oz (101.8 kg) Height:  5\' 3"  (160 cm)  BEHAVIORAL SYMPTOMS/MOOD NEUROLOGICAL BOWEL NUTRITION STATUS      Continent Diet(see discharge summary)  AMBULATORY STATUS COMMUNICATION OF NEEDS Skin   Extensive Assist(Stedy used) Verbally Other (Comment)(right leg and right shoulder closed surgical incision)                       Personal Care Assistance Level of Assistance  Bathing, Dressing, Total care, Feeding Bathing Assistance: Limited assistance Feeding assistance: Independent Dressing Assistance: Limited assistance Total Care Assistance: Limited assistance   Functional Limitations Info  Sight, Speech, Hearing Sight Info: Adequate Hearing Info: Adequate Speech Info: Adequate    SPECIAL CARE FACTORS FREQUENCY  PT (By licensed PT), OT (By licensed OT)     PT Frequency: min 5x weekly OT Frequency: min 5x weekly            Contractures Contractures Info: Not present    Additional Factors Info  Code Status, Allergies Code Status Info: Full Allergies Info: Novocain (Procaine Hcl) Other Latex Penicillins Sulfa Antibiotics           Current Medications (09/10/2018):  This is the current hospital active medication list Current Facility-Administered Medications  Medication Dose Route Frequency Provider Last Rate Last Dose  . 0.9 %  sodium chloride infusion   Intravenous Continuous Lanae Crumbly, PA-C 85 mL/hr at 09/08/18 1742    . acetaminophen (TYLENOL) tablet 325-650 mg  325-650 mg Oral Q6H PRN Benjiman Core  M, PA-C   650 mg at 09/10/18 0936  . albuterol (PROVENTIL) (2.5 MG/3ML) 0.083% nebulizer solution 2.5 mg  2.5 mg Nebulization Daily PRN Lanae Crumbly, PA-C      . aspirin EC tablet 325 mg  325 mg Oral Q breakfast Lanae Crumbly, PA-C   325 mg at 09/10/18 0900  . bupivacaine liposome (EXPAREL) 1.3 % injection 266 mg  20 mL  Infiltration Once Marybelle Killings, MD      . cholecalciferol (VITAMIN D3) tablet 2,000 Units  2,000 Units Oral BID Lanae Crumbly, PA-C   2,000 Units at 09/10/18 3154  . dextromethorphan-guaiFENesin (MUCINEX DM) 30-600 MG per 12 hr tablet 1 tablet  1 tablet Oral BID PRN Lanae Crumbly, PA-C      . docusate sodium (COLACE) capsule 100 mg  100 mg Oral BID Lanae Crumbly, PA-C   100 mg at 09/10/18 0086  . hydrochlorothiazide (MICROZIDE) capsule 12.5 mg  12.5 mg Oral Daily Lady Deutscher, MD   12.5 mg at 09/10/18 7619  . HYDROcodone-acetaminophen (NORCO) 7.5-325 MG per tablet 1-2 tablet  1-2 tablet Oral Q4H PRN Lanae Crumbly, PA-C   2 tablet at 09/10/18 5093  . lactated ringers infusion   Intravenous Continuous Roderic Palau, MD 10 mL/hr at 09/08/18 1042    . lisinopril (ZESTRIL) tablet 20 mg  20 mg Oral Daily Lady Deutscher, MD   20 mg at 09/10/18 2671  . menthol-cetylpyridinium (CEPACOL) lozenge 3 mg  1 lozenge Oral PRN Lanae Crumbly, PA-C       Or  . phenol (CHLORASEPTIC) mouth spray 1 spray  1 spray Mouth/Throat PRN Lanae Crumbly, PA-C      . methocarbamol (ROBAXIN) tablet 500 mg  500 mg Oral Q6H PRN Lanae Crumbly, PA-C   500 mg at 09/10/18 2458   Or  . methocarbamol (ROBAXIN) 500 mg in dextrose 5 % 50 mL IVPB  500 mg Intravenous Q6H PRN Lanae Crumbly, PA-C      . metoCLOPramide (REGLAN) tablet 5-10 mg  5-10 mg Oral Q8H PRN Lanae Crumbly, PA-C   10 mg at 09/10/18 0935   Or  . metoCLOPramide (REGLAN) injection 5-10 mg  5-10 mg Intravenous Q8H PRN Lanae Crumbly, PA-C   10 mg at 09/08/18 1846  . metoprolol succinate (TOPROL-XL) 24 hr tablet 25 mg  25 mg Oral Daily Nita Sells, MD   25 mg at 09/10/18 0923  . nystatin ointment (MYCOSTATIN) 1 application  1 application Topical BID PRN Lanae Crumbly, PA-C      . ondansetron [facility]) tablet 4 mg  4 mg Oral Q6H PRN Lanae Crumbly, PA-C       Or  . ondansetron [facility]) injection 4 mg  4 mg Intravenous Q6H PRN Benjiman Core M,  PA-C      . pantoprazole (PROTONIX) EC tablet 40 mg  40 mg Oral Daily Benjiman Core M, PA-C   40 mg at 09/10/18 0998  . polyethylene glycol (MIRALAX / GLYCOLAX) packet 17 g  17 g Oral Daily PRN Lanae Crumbly, PA-C      . potassium chloride (K-DUR) CR tablet 10 mEq  10 mEq Oral Daily Lanae Crumbly, PA-C   10 mEq at 09/10/18 3382  . sertraline (ZOLOFT) tablet 100 mg  100 mg Oral Daily Lanae Crumbly, PA-C   100 mg at 09/10/18 5053  . traZODone (DESYREL) tablet 50 mg  50 mg Oral QHS Benjiman Core  M, PA-C   50 mg at 09/09/18 2233     Discharge Medications: Please see discharge summary for a list of discharge medications.  Relevant Imaging Results:  Relevant Lab Results:   Additional Information SSN: [id number]  Alberteen Sam, LCSW

## 2018-09-10 NOTE — Progress Notes (Signed)
PROGRESS NOTE  [name] [name] OZD:[id number] DOB: 1941/01/19 DOA: 09/08/2018 PCP: Curlene Labrum, MD  Brief History   78 year old Caucasian female multiple colonic adenomas, HTN, bipolar, Barrett's esophagus 2016, moderate obesity, multiple orthopedic surgeries and cataracts Gastric bypass Prior choledocholithiasis and ERCP at [facility] by Dr. Delrae Alfred Presented for routine knee surgery but found to be hypertensive subsequently Consulted by orthopedics for management of blood pressure  Consultants  . Hospitalists  Procedures  . Right knee arthroplasty  Antibiotics   Anti-infectives (From admission, onward)   Start     Dose/Rate Route Frequency Ordered Stop   09/08/18 2300  vancomycin (VANCOCIN) IVPB 1000 mg/200 mL premix     1,000 mg 200 mL/hr over 60 Minutes Intravenous Every 12 hours 09/08/18 1709 09/08/18 2342   09/08/18 0600  vancomycin (VANCOCIN) 1,500 mg in sodium chloride 0.9 % 500 mL IVPB     1,500 mg 250 mL/hr over 120 Minutes Intravenous To Surgery 09/06/18 1527 09/08/18 1249    .   Subjective  The patient is resting comfortably. No new complaints.  Objective   Vitals:  Vitals:   09/10/18 0928 09/10/18 1257  BP: 123/61 (!) 104/53  Pulse:  89  Resp: 18   Temp:  98.2 F (36.8 C)  SpO2: 98% 98%    Exam:  Constitutional:  . Appears calm and comfortable Eyes:  . pupils and irises appear normal . Normal lids and conjunctivae ENMT:  . grossly normal hearing  . Lips appear normal . external ears, nose appear normal . Oropharynx: mucosa, tongue,posterior pharynx appear normal Neck:  . neck appears normal, no masses, normal ROM, supple . no thyromegaly Respiratory:  . CTA bilaterally, no w/r/r.  . Respiratory effort normal. No retractions or accessory muscle use Cardiovascular:  . RRR, no m/r/g . No LE extremity edema   . Normal pedal pulses Abdomen:  . Abdomen appears normal; no tenderness or masses . No hernias . No HSM Musculoskeletal:  .  Digits/nails BUE: no clubbing, cyanosis, petechiae, infection . exam of joints, bones, muscles of at least one of following: head/neck, RUE, LUE, RLE, LLE   o strength and tone normal, no atrophy, no abnormal movements o No tenderness, masses o Normal ROM, no contractures  . gait and station Skin:  . No rashes, lesions, ulcers . palpation of skin: no induration or nodules Neurologic:  . CN 2-12 intact . Sensation all 4 extremities intact Psychiatric:  . Mental status o Mood, affect appropriate o Orientation to person, place, time  . judgment and insight appear intact     I have personally reviewed the following:   Today's Data  . CBC, Vitals  Scheduled Meds: . aspirin EC  325 mg Oral Q breakfast  . bupivacaine liposome  20 mL Infiltration Once  . cholecalciferol  2,000 Units Oral BID  . docusate sodium  100 mg Oral BID  . hydrochlorothiazide  12.5 mg Oral Daily  . lisinopril  20 mg Oral Daily  . metoprolol succinate  25 mg Oral Daily  . pantoprazole  40 mg Oral Daily  . potassium chloride  10 mEq Oral Daily  . sertraline  100 mg Oral Daily  . traZODone  50 mg Oral QHS   Continuous Infusions: . sodium chloride 85 mL/hr at 09/08/18 1742  . lactated ringers 10 mL/hr at 09/08/18 1042  . methocarbamol (ROBAXIN) IV      Principal Problem:   Primary hypertension Active Problems:   GERD (gastroesophageal reflux disease)   Unilateral  primary osteoarthritis, right knee   Arthritis of right knee   LOS: 1 day   A & P  Hypertension: Actually a couple of low blood pressures on HCTZ. I have stopped it. Monitor blood pressures.  Blood loss anemia: Expected. Hemoglobin is stable.  GERD:  PPI  Osteoarthritis of right knee: S/P Knee surgery.  I have seen and examined this patient myself. I have spent 32 minutes in her evaluation and care.  DVT prophylaxis:As per primary Code Status: Full Code Family Communication: None available Disposition Plan: SNF  Kalaya Infantino, DO  [facility] Direct contact: see [email or web address]  7PM-7AM contact night coverage as above  09/10/2018, 4:24 PM  LOS: 1 day

## 2018-09-11 LAB — NOVEL CORONAVIRUS, NAA (HOSP ORDER, SEND-OUT TO REF LAB; TAT 18-24 HRS): SARS-CoV-2, NAA: NOT DETECTED

## 2018-09-11 LAB — CBC
HCT: 27.5 % — ABNORMAL LOW (ref 36.0–46.0)
Hemoglobin: 8.6 g/dL — ABNORMAL LOW (ref 12.0–15.0)
MCH: 28.1 pg (ref 26.0–34.0)
MCHC: 31.3 g/dL (ref 30.0–36.0)
MCV: 89.9 fL (ref 80.0–100.0)
Platelets: 132 10*3/uL — ABNORMAL LOW (ref 150–400)
RBC: 3.06 MIL/uL — ABNORMAL LOW (ref 3.87–5.11)
RDW: 14.6 % (ref 11.5–15.5)
WBC: 12.3 10*3/uL — ABNORMAL HIGH (ref 4.0–10.5)
nRBC: 0 % (ref 0.0–0.2)

## 2018-09-11 LAB — SARS CORONAVIRUS 2 BY RT PCR (HOSPITAL ORDER, PERFORMED IN ~~LOC~~ HOSPITAL LAB): SARS Coronavirus 2: NEGATIVE

## 2018-09-11 MED ORDER — LISINOPRIL 5 MG PO TABS: 5.0000 mg | ORAL_TABLET | Freq: Every day | ORAL | 0 refills | Status: DC

## 2018-09-11 MED ORDER — LISINOPRIL 20 MG PO TABS: 20.0000 mg | ORAL_TABLET | Freq: Every day | ORAL | Status: DC

## 2018-09-11 MED ORDER — LISINOPRIL 5 MG PO TABS: 5.0000 mg | ORAL_TABLET | Freq: Every day | ORAL | Status: DC

## 2018-09-11 MED ORDER — METOPROLOL SUCCINATE ER 25 MG PO TB24: 25.0000 mg | ORAL_TABLET | Freq: Every day | ORAL | Status: DC

## 2018-09-11 NOTE — Progress Notes (Signed)
CSW informed nurse that order in currently for COVID test could take several days, CSW requesting SARS rapid covid test be ordered due to dc summary and orders in and patient having bed at [facility].   [place], [place]

## 2018-09-11 NOTE — Discharge Summary (Addendum)
Physician Discharge Summary  Patient ID: [name] [name] MRN: [id number] DOB/AGE: [birth year] 78 y.o.  Admit date: 09/08/2018 Discharge date: 09/11/2018  Admission Diagnoses: Right knee osteoarthritis  Discharge Diagnoses: Right knee osteoarthritis, procedure right total knee arthroplasty.  Discharged Condition: Satisfactory  Hospital Course: 78 year old female admitted with progressive right knee osteoarthritis failed conservative treatment including anti-inflammatories and injections , and use of a cane and walker.  She underwent right total knee arthroplasty.  Physical therapy progress was slow due to generalized weakness deconditioning.  By day 2 she clearly stand at the bedside with therapy and needed assistance for transfers.  She had previous left total knee arthroplasty several years ago and was able to go home with home physical therapy due to slow progress with physical therapy skilled nursing facility for additional rehab was necessary.  Dressing was changed incision looks good.  She is seen by PT OT.  Consults: PT, OT.  Medical consult for hypertension Zestril 20 mg and Toprol XL 24-hour tablet 25 mg dose was added to her daily medications.  Significant Diagnostic Studies: Preop hemoglobin was 13.1 postop she was 8.6-9.2 range and stable.    Treatments: Right total knee arthroplasty.  Discharge Exam: Blood pressure (!) 96/58, pulse (!) 106, temperature 97.8 F (36.6 C), temperature source Oral, resp. rate 17, height 5\' 3"  (1.6 m), weight 101.8 kg, SpO2 90 %. Patient's incision was clean dry.  She had full extension of her knee should use the CPM machine and been able to flex past 70 degrees.  Disposition: Discharge disposition: 03-Skilled [place]      Office follow-up with Dr. Lorin Mercy in 1 to 2 weeks.  She can be seen in the [place] office on a Thursday.  Prescription for Norco placed on chart should be on aspirin daily for 4 weeks.   Follow-up Information    Marybelle Killings, MD. Schedule an appointment as soon as possible for a visit today.   Specialty: Orthopedic Surgery Why: needs return office visit with Dr Lorin Mercy 2 weeks postop in the [facility] clinic.  Contact information: [place] [address] [phone]           Signed: Marybelle Killings 09/11/2018, 7:56 AM

## 2018-09-11 NOTE — Plan of Care (Signed)
  Problem: Education: Goal: Knowledge of the prescribed therapeutic regimen will improve Outcome: Progressing   Problem: Activity: Goal: Ability to avoid complications of mobility impairment will improve Outcome: Progressing   Problem: Education: Goal: Knowledge of General Education information will improve Description: Including pain rating scale, medication(s)/side effects and non-pharmacologic comfort measures Outcome: Progressing   Problem: Activity: Goal: Risk for activity intolerance will decrease Outcome: Progressing   Problem: Pain Managment: Goal: General experience of comfort will improve Outcome: Progressing   Problem: Safety: Goal: Ability to remain free from injury will improve Outcome: Progressing   Problem: Skin Integrity: Goal: Risk for impaired skin integrity will decrease Outcome: Progressing   

## 2018-09-11 NOTE — Progress Notes (Signed)
Physical Therapy Treatment Patient Details Name: [name] [name] MRN: [id number] DOB: 1940-09-18 Today's Date: 09/11/2018    History of Present Illness Pt is a 78 y.o. female s/p elective R TKA 09/08/18. PMH includes anxiety, depression, OA, hernia, HTN, obesity, back sx, R TSA (2014), R THA (1995), L THA (2008), L partial knee replacement.    PT Comments    Pt soiled in bed on arrival, assisted patient to edge of bed and into standing for perianal care.  NT present and applied barrier cream to patient's bottom.  Pt required increased assistance today to achieve standing after unsuccessful 1 st attempt.  She required elevate surface height of bed to achieve standing.  Pt continues to benefit from SNF placement and is eager to d/c to SNF in [place] today.     Follow Up Recommendations  SNF;Follow surgeon's recommendation for DC plan and follow-up therapies     Equipment Recommendations  None recommended by PT    Recommendations for Other Services       Precautions / Restrictions Precautions Precautions: Knee Precaution Booklet Issued: Yes (comment) Required Braces or Orthoses: Knee Immobilizer - Right Knee Immobilizer - Right: (on except in CPM /PT) Restrictions Weight Bearing Restrictions: Yes RLE Weight Bearing: Weight bearing as tolerated    Mobility  Bed Mobility Overal bed mobility: Needs Assistance Bed Mobility: Supine to Sit;Sit to Supine     Supine to sit: Supervision     General bed mobility comments: Increased time and effort to move edge of bed.  Heavy use of railings to achieve sitting edge of bed.  Transfers Overall transfer level: Needs assistance Equipment used: Rolling walker (2 wheeled) Transfers: Sit to/from Stand Sit to Stand: Mod assist;+2 physical assistance         General transfer comment: Cues for hand placement,  Pt unsuccessful on 1st attempt she required +2 assistance and elevated surface height of bed to achieve  standing.  Ambulation/Gait Ambulation/Gait assistance: Min assist;+2 safety/equipment Gait Distance (Feet): (90ft) Assistive device: Rolling walker (2 wheeled) Gait Pattern/deviations: Step-to pattern;Antalgic;Trunk flexed     General Gait Details: Pt appears more fatigued and only able to turn and sit in recliner this session.   Stairs             Wheelchair Mobility    Modified Rankin (Stroke Patients Only)       Balance Overall balance assessment: Needs assistance   Sitting balance-Leahy Scale: Fair       Standing balance-Leahy Scale: Poor                              Cognition Arousal/Alertness: Awake/alert Behavior During Therapy: Flat affect Overall Cognitive Status: Within Functional Limits for tasks assessed                                        Exercises      General Comments        Pertinent Vitals/Pain Pain Assessment: Faces Faces Pain Scale: Hurts even more Pain Location: R knee Pain Descriptors / Indicators: Operative site guarding;Grimacing Pain Intervention(s): Monitored during session;Repositioned    Home Living                      Prior Function            PT Goals (current goals can now  be found in the care plan section) Acute Rehab PT Goals Patient Stated Goal: Agreeable to SNF before return home Potential to Achieve Goals: Good Progress towards PT goals: Progressing toward goals    Frequency    7X/week      PT Plan Current plan remains appropriate    Co-evaluation              AM-PAC PT "6 Clicks" Mobility   Outcome Measure  Help needed turning from your back to your side while in a flat bed without using bedrails?: A Little Help needed moving from lying on your back to sitting on the side of a flat bed without using bedrails?: A Little Help needed moving to and from a bed to a chair (including a wheelchair)?: A Lot Help needed standing up from a chair using your  arms (e.g., wheelchair or bedside chair)?: A Lot Help needed to walk in hospital room?: A Little Help needed climbing 3-5 steps with a railing? : A Lot 6 Click Score: 15    End of Session Equipment Utilized During Treatment: Gait belt Activity Tolerance: Patient tolerated treatment well Patient left: in chair;with call bell/phone within reach Nurse Communication: Mobility status PT Visit Diagnosis: Other abnormalities of gait and mobility (R26.89);Muscle weakness (generalized) (M62.81)     Time: 4076-8088 PT Time Calculation (min) (ACUTE ONLY): 21 min  Charges:  $Therapeutic Activity: 8-22 mins                     Governor Rooks, PTA Acute Rehabilitation Services Pager [phone] Office [phone]     Jennier Schissler Eli Hose 09/11/2018, 2:48 PM

## 2018-09-11 NOTE — Progress Notes (Signed)
Talked to [facility] for patient report.

## 2018-09-11 NOTE — Progress Notes (Signed)
   Subjective: 3 Days Post-Op Procedure(s) (LRB): RIGHT TOTAL KNEE ARTHROPLASTY (Right) Patient reports pain as mild and moderate.    Objective: Vital signs in last 24 hours: Temp:  [97.8 F (36.6 C)-98.2 F (36.8 C)] 97.8 F (36.6 C) (07/09 0534) Pulse Rate:  [82-106] 106 (07/09 0539) Resp:  [17-18] 17 (07/09 0534) BP: (95-123)/(43-61) 96/58 (07/09 0539) SpO2:  [90 %-98 %] 90 % (07/09 0534)  Intake/Output from previous day: 07/08 0701 - 07/09 0700 In: 720 [P.O.:720] Out: 300 [Urine:300] Intake/Output this shift: No intake/output data recorded.  Recent Labs    09/09/18 0341 09/10/18 0317 09/11/18 0201  HGB 9.8* 9.2* 8.6*   Recent Labs    09/10/18 0317 09/11/18 0201  WBC 8.8 12.3*  RBC 3.30* 3.06*  HCT 29.4* 27.5*  PLT 120* 132*   Recent Labs    09/09/18 0341  NA 138  K 4.1  CL 104  CO2 26  BUN 16  CREATININE 0.91  GLUCOSE 139*  CALCIUM 8.4*   No results for input(s): LABPT, INR in the last 72 hours.  Neurologically intact No results found.  Assessment/Plan: 3 Days Post-Op Procedure(s) (LRB): RIGHT TOTAL KNEE ARTHROPLASTY (Right) Up with therapy, d/c [place]I . SNF . Office one week  [name] [name] 09/11/2018, 7:44 AM

## 2018-09-11 NOTE — TOC Transition Note (Addendum)
Transition of Care [facility]) - CM/SW Discharge Note   Patient Details  Name: [name] [name] MRN: [id number] Date of Birth: [birth year]  [facility] of [facility]) CM/SW Contact:  Alberteen Sam, LCSW Phone Number: 09/11/2018, 2:51 PM   Clinical Narrative:     Patient will DC to: [facility] Anticipated DC date: 09/11/2018 Family notified: Colletta Maryland (niece) Transport by: Corey Harold  Per MD patient ready for DC to [facility]. RN, patient, patient's family, and facility notified of DC. Discharge Summary sent to facility. RN given number for report [phone]  . DC packet on chart. Ambulance transport requested for patient for 4:00pm.  CSW signing off.  [place], [place]   Final next level of care: Skilled Nursing Facility Barriers to Discharge: No Barriers Identified   Patient Goals and CMS Choice Patient states their goals for this hospitalization and ongoing recovery are:: to go to rehab then go home CMS Medicare.gov Compare Post Acute Care list provided to:: Patient Choice offered to / list presented to : Patient  Discharge Placement PASRR number recieved: 09/10/18            Patient chooses bed at: [facility] Patient to be transferred to facility by: [place] Name of family member notified: niece Colletta Maryland Patient and family notified of of transfer: 09/11/18  Discharge Plan and Services     Post Acute Care Choice: [name]                               Social Determinants of Health (SDOH) Interventions     Readmission Risk Interventions No flowsheet data found.

## 2018-09-11 NOTE — Progress Notes (Signed)
PROGRESS NOTE  [name] [name] [id number]:[id number] DOB: August 20, 1940 DOA: 09/08/2018 PCP: Curlene Labrum, MD  Brief History   78 year old Caucasian female multiple colonic adenomas, HTN, bipolar, Barrett's esophagus 2016, moderate obesity, multiple orthopedic surgeries and cataracts, and history of gastric bypass surgery. She has also been treated for prior choledocholithiasis and ERCP at [facility] by Dr. Delrae Alfred. The patient presented for routine knee surgery but found to be hypertensive. The hospitalist service has been subsequently consulted by orthopedics for management of blood pressure.  Consultants  . Hospitalists  Procedures  . Right knee arthroplasty  Antibiotics   Anti-infectives (From admission, onward)   Start     Dose/Rate Route Frequency Ordered Stop   09/08/18 2300  vancomycin (VANCOCIN) IVPB 1000 mg/200 mL premix     1,000 mg 200 mL/hr over 60 Minutes Intravenous Every 12 hours 09/08/18 1709 09/08/18 2342   09/08/18 0600  vancomycin (VANCOCIN) 1,500 mg in sodium chloride 0.9 % 500 mL IVPB     1,500 mg 250 mL/hr over 120 Minutes Intravenous To Surgery 09/06/18 1527 09/08/18 1249      Subjective  The patient is resting comfortably. No new complaints.  Objective   Vitals:  Vitals:   09/11/18 0539 09/11/18 0827  BP: (!) 96/58 (!) 89/53  Pulse: (!) 106 100  Resp:  18  Temp:  98.6 F (37 C)  SpO2:  91%    Exam:  Constitutional:  . The patient is awake, alert, and oriented x 3. No acute distress. Respiratory:  . There is no increased work of breathing. . No wheezes, rales, or rhonchi. . No tactile fremitus. Cardiovascular:  . Regular rate and rhythm . No murmurs, ectopy, or gallups are appreciated. . No lateral PMI. No thrills. Abdomen:  . Abdomen is soft, non-tender, non-distended . No hernias, masses, or organomegaly are appreciated  . Normoactive bowel sounds. Musculoskeletal:  . No cyanosis, clubbing, or edema. Skin:  . No rashes, lesions, ulcers .  palpation of skin: no induration or nodules Neurologic:  . CN 2-12 intact . Sensation all 4 extremities intact Psychiatric:  . Mental status o Mood, affect appropriate o Orientation to person, place, time  . judgment and insight appear intact     I have personally reviewed the following:   Today's Data  . CBC, Vitals  Scheduled Meds: . aspirin EC  325 mg Oral Q breakfast  . bupivacaine liposome  20 mL Infiltration Once  . cholecalciferol  2,000 Units Oral BID  . docusate sodium  100 mg Oral BID  . lisinopril  20 mg Oral Daily  . metoprolol succinate  25 mg Oral Daily  . pantoprazole  40 mg Oral Daily  . potassium chloride  10 mEq Oral Daily  . sertraline  100 mg Oral Daily  . traZODone  50 mg Oral QHS   Continuous Infusions: . sodium chloride 85 mL/hr at 09/10/18 2051  . lactated ringers 10 mL/hr at 09/08/18 1042  . methocarbamol (ROBAXIN) IV      Principal Problem:   Primary hypertension Active Problems:   GERD (gastroesophageal reflux disease)   Unilateral primary osteoarthritis, right knee   Arthritis of right knee   LOS: 2 days   A & P  Hypertension: Blood pressure remain on the low side. I have reduced the patient's dose of lisinopril from 20 mg daily to 5 mg daily. Blood pressures should be followed by PCP after discharge.  Blood loss anemia: Expected. Hemoglobin is stable.  GERD:  PPI  Osteoarthritis of right knee: S/P Knee surgery.  I have seen and examined this patient myself. I have spent 34 minutes in her evaluation and care. The patient is stable and appropriate for discharge.  DVT prophylaxis:As per primary Code Status: Full Code Family Communication: None available Disposition Plan: SNF  Heavyn Yearsley, DO [facility] Direct contact: see [email or web address]  7PM-7AM contact night coverage as above  09/11/2018, 1:25 PM  LOS: 1 day

## 2018-09-25 ENCOUNTER — Encounter: Payer: Self-pay | Admitting: Orthopaedic Surgery

## 2018-09-25 ENCOUNTER — Other Ambulatory Visit: Payer: Self-pay

## 2018-09-25 ENCOUNTER — Ambulatory Visit: Payer: Self-pay

## 2018-09-25 ENCOUNTER — Ambulatory Visit (INDEPENDENT_AMBULATORY_CARE_PROVIDER_SITE_OTHER): Payer: Medicare Other | Admitting: Orthopaedic Surgery

## 2018-09-25 VITALS — BP 131/63 | HR 77 | Ht 63.0 in | Wt 218.0 lb

## 2018-09-25 DIAGNOSIS — Z96651 Presence of right artificial knee joint: Secondary | ICD-10-CM | POA: Diagnosis not present

## 2018-09-25 NOTE — Progress Notes (Signed)
Post-Op Visit Note   Patient: [name] [name]           Date of Birth: 10/01/1940           MRN: [id number] Visit Date: 09/25/2018 PCP: Curlene Labrum, MD   Assessment & Plan: Post right total knee arthroplasty.  Staples harvested Steri-Strips applied.  Chief Complaint:  Chief Complaint  Patient presents with  . Right Knee - Routine Post Op   Visit Diagnoses:  1. Presence of right artificial knee joint   2. S/P total knee arthroplasty, right     Plan: Patient is continuing postop rehab in a skilled facility.  When she gets out she will need some home health physical therapy.  She has a niece who has a business next door to her house.  I plan to check her in 5 weeks.  Follow-Up Instructions: No follow-ups on file.   Orders:  Orders Placed This Encounter  Procedures  . XR KNEE 3 VIEW RIGHT   No orders of the defined types were placed in this encounter.   Imaging: No results found.  PMFS History: Patient Active Problem List   Diagnosis Date Noted  . S/P total knee arthroplasty, 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  . Arthritis of right knee 09/08/2018  . Primary osteoarthritis, 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  . History of arthroplasty of right shoulder 08/14/2018  . History of colonic polyps 08/14/2017  . Choledocholithiasis   . Abnormal CT scan, esophagus   . Cholestasis 02/05/2017  . Depression 02/05/2017  . Hypomagnesemia 02/05/2017  . Bradycardia 02/05/2017  . Transaminitis 07/08/2016  . Constipation 07/08/2016  . GIB (gastrointestinal bleeding) 07/08/2016  . HCAP (healthcare-associated pneumonia) 09/23/2015  . AKI (acute kidney injury) ([place]) 07/21/2015  . Hypokalemia 07/21/2015  . CAP (community acquired pneumonia) 07/14/2015  . Upper abdominal pain 06/10/2014  . Folliculitis of perineum 02/24/2014  . Giant comedone 12/21/2013  . 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 Acrochordon 11/25/2013  . Dysphagia 10/29/2013  . Class 2 severe obesity due to excess calories  with serious comorbidity and body mass index (BMI) of 38.0 to 38.9 in adult ([place]) 10/02/2013  . Primary hypertension   . GERD (gastroesophageal reflux disease)   . Anxiety   . Anemia of chronic disease    Past Medical History:  Diagnosis Date  . Anemia   . Anxiety   . Arthritis   . Barrett's esophagus    seen on 2016 egd at [facility]  . Depression   . GERD (gastroesophageal reflux disease)   . H/O hiatal hernia   . Hypertension   . Obesity   . Pneumonia   . PONV (postoperative nausea and vomiting)   . Seasonal allergies   . Sepsis ([place]) 10/02/2013    Family History  Problem Relation Age of Onset  . Colon cancer Brother        IN HIS 60s-METASTATIC  . Colon polyps Paternal Grandfather   . Breast cancer Mother   . Hypertension Father   . Heart disease Father   . Hypertension Brother   . Heart disease Brother     Past Surgical History:  Procedure Laterality Date  . ABDOMINAL HYSTERECTOMY    . ABDOMINAL SURGERY    . APPENDECTOMY    . BACK SURGERY     lumbar x2 by Dr Arnoldo Morale  . CARPAL TUNNEL RELEASE Right 03/2014   Dr. Lorin Mercy  . CATARACT EXTRACTION W/PHACO Left 08/06/2013   Procedure: CATARACT EXTRACTION PHACO AND INTRAOCULAR LENS PLACEMENT LEFT EYE;  Surgeon: Tonny Branch, MD;  Location: [facility];  Service: Ophthalmology;  Laterality: Left;  CDE: 9.55  . CATARACT EXTRACTION W/PHACO Right 08/24/2013   Procedure: CATARACT EXTRACTION PHACO AND INTRAOCULAR LENS PLACEMENT (IOC);  Surgeon: Tonny Branch, MD;  Location: [facility];  Service: Ophthalmology;  Laterality: Right;  CDE:8.30  . CERVICAL FUSION    . CHOLECYSTECTOMY    . COLONOSCOPY N/A 11/03/2013   SLF: 1. Normal mucosa in the terminal ileum. 2. 13 colon polyps removed. 3. Moderate diverticulosis in the descending colon and sigmoid colon 4. the left colon is redundant 5. Small internal  hemorrhoids.  . COLONOSCOPY N/A 09/25/2017   Procedure: COLONOSCOPY;  Surgeon: Rogene Houston, MD;  [facility];  Service: Endoscopy;   Laterality: N/A;  1:55  . ESOPHAGOGASTRODUODENOSCOPY N/A 11/03/2013   SLF: 1. 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 Mild Non-erosive gastritis  . 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 s/p gastric bypass  . EYE SURGERY    . GASTRIC BYPASS  1979   revision in 1980   . HIATAL HERNIA REPAIR     [facility] in 2011  . JOINT REPLACEMENT Right 1995   hip  . JOINT REPLACEMENT Left 2008   hip  . MEDIAL PARTIAL KNEE REPLACEMENT Left    Dr Ronnie Derby  . PARAESOPHAGEAL HERNIA REPAIR  SEP 2010 DR. MCNATT  . POLYPECTOMY  09/25/2017   Procedure: POLYPECTOMY;  Surgeon: Rogene Houston, MD;  [facility];  Service: Endoscopy;;  colon  . SHOULDER ARTHROSCOPY Right   . SHOULDER ARTHROSCOPY Right    x2  . SKIN BIOPSY    . TONSILLECTOMY    . TOTAL KNEE ARTHROPLASTY Right 09/08/2018   Procedure: RIGHT TOTAL KNEE ARTHROPLASTY;  Surgeon: Marybelle Killings, MD;  Location: [place];  Service: Orthopedics;  Laterality: Right;  . TOTAL SHOULDER ARTHROPLASTY Right 02/16/2013   Procedure: TOTAL SHOULDER ARTHROPLASTY;  Surgeon: Marybelle Killings, MD;  Location: [place];  Service: Orthopedics;  Laterality: Right;  Right Total Shoulder Arthroplasty, Cemented   Social History   Occupational History  . Not on file  Tobacco Use  . Smoking status: Never Smoker  . Smokeless tobacco: Never Used  Substance and Sexual Activity  . Alcohol use: No  . Drug use: No  . Sexual activity: Yes    Birth control/protection: Surgical

## 2018-10-10 ENCOUNTER — Telehealth: Payer: Self-pay | Admitting: Radiology

## 2018-10-10 NOTE — Telephone Encounter (Signed)
[name] [name] with [facility] called requesting verbal orders for HHPT. Per last office note, patient will need HHPT after being discharged from rehab facility.  I called [facility] and gave verbal orders for HHPT, patient status post right total knee on 09/08/2018.

## 2018-10-12 NOTE — Telephone Encounter (Signed)
Yes. Ok    thanks

## 2018-10-13 NOTE — Telephone Encounter (Signed)
IC verbal given.  

## 2018-10-23 ENCOUNTER — Ambulatory Visit (INDEPENDENT_AMBULATORY_CARE_PROVIDER_SITE_OTHER): Payer: Medicare Other | Admitting: Orthopaedic Surgery

## 2018-10-23 ENCOUNTER — Other Ambulatory Visit: Payer: Self-pay

## 2018-10-23 VITALS — BP 157/77 | HR 57 | Ht 63.0 in | Wt 214.0 lb

## 2018-10-23 DIAGNOSIS — Z96651 Presence of right artificial knee joint: Secondary | ICD-10-CM

## 2018-10-23 MED ORDER — HYDROCODONE-ACETAMINOPHEN 5-325 MG PO TABS: 1.0000 | ORAL_TABLET | Freq: Four times a day (QID) | ORAL | 0 refills | Status: DC | PRN

## 2018-10-23 NOTE — Progress Notes (Signed)
Post-Op Visit Note   Patient: [name] [name]           Date of Birth: Jun 25, 1940           MRN: [id number] Visit Date: 10/23/2018 PCP: Curlene Labrum, MD   Assessment & Plan: Post right total knee arthroplasty she lacks about 2 degrees reaching full extension passively she does reach full extension she is flexing to 110 degrees she still has some soreness of the proximal tibia distal to the [place] region.  She is making progress ambulating with a rolling walker with reverse seat.  Still having some pain final prescription Norco 30 tablets prescribed 5/325.  She has been gradually weaning her pain medication.  I congratulated her on her good work she is done on her knee rehab and she should continue to get improved strength and improved ambulation.  Chief Complaint: post TKA right Visit Diagnoses:  1. S/P total knee arthroplasty, right     Plan: Patient is making gradual progress with range of motion and strengthening.  She has had home therapy after she skilled facility.  She states she cannot afford going outpatient therapy due to out-of-pocket cost and can continue home therapy once or twice a week for a few more weeks.  I plan to recheck of her final visit in 3 months.  Follow-Up Instructions: No follow-ups on file.   Orders:  No orders of the defined types were placed in this encounter.  No orders of the defined types were placed in this encounter.   Imaging: No results found.  PMFS History: Patient Active Problem List   Diagnosis Date Noted  . S/P total knee arthroplasty, 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  . Primary osteoarthritis, 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  . History of arthroplasty of right shoulder 08/14/2018  . History of colonic polyps 08/14/2017  . Choledocholithiasis   . Abnormal CT scan, esophagus   . Cholestasis 02/05/2017  . Depression 02/05/2017  . Hypomagnesemia 02/05/2017  . Bradycardia 02/05/2017  . Transaminitis 07/08/2016  . Constipation 07/08/2016  . GIB  (gastrointestinal bleeding) 07/08/2016  . HCAP (healthcare-associated pneumonia) 09/23/2015  . AKI (acute kidney injury) ([place]) 07/21/2015  . Hypokalemia 07/21/2015  . CAP (community acquired pneumonia) 07/14/2015  . Upper abdominal pain 06/10/2014  . Folliculitis of perineum 02/24/2014  . Giant comedone 12/21/2013  . 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 Acrochordon 11/25/2013  . Dysphagia 10/29/2013  . Class 2 severe obesity due to excess calories with serious comorbidity and body mass index (BMI) of 38.0 to 38.9 in adult ([place]) 10/02/2013  . Primary hypertension   . GERD (gastroesophageal reflux disease)   . Anxiety   . Anemia of chronic disease    Past Medical History:  Diagnosis Date  . Anemia   . Anxiety   . Arthritis   . Barrett's esophagus    [facility] on [facility]d [facility]  . Depression   . GERD (gastroesophageal reflux disease)   . H/O hiatal hernia   . Hypertension   . Obesity   . Pneumonia   . PONV (postoperative nausea and vomiting)   . Seasonal allergies   . Sepsis ([place]) 10/02/2013    Family History  Problem Relation Age of Onset  . Colon cancer Brother        IN HIS 60s-METASTATIC  . Colon polyps Paternal Grandfather   . Breast cancer Mother   . Hypertension Father   . Heart disease Father   . Hypertension Brother   . Heart disease  Brother     Past Surgical History:  Procedure Laterality Date  . ABDOMINAL HYSTERECTOMY    . ABDOMINAL SURGERY    . APPENDECTOMY    . BACK SURGERY     lumbar x2 by Dr Arnoldo Morale  . CARPAL TUNNEL RELEASE Right 03/2014   Dr. Lorin Mercy  . CATARACT EXTRACTION W/PHACO Left 08/06/2013   Procedure: CATARACT EXTRACTION PHACO AND INTRAOCULAR LENS PLACEMENT LEFT EYE;  Surgeon: Tonny Branch, MD;  [facility];  Service: Ophthalmology;  Laterality: Left;  CDE: 9.55  . CATARACT EXTRACTION W/PHACO Right 08/24/2013   Procedure: CATARACT EXTRACTION PHACO AND INTRAOCULAR LENS PLACEMENT (IOC);  Surgeon: Tonny Branch, MD;  [facility];   Service: Ophthalmology;  Laterality: Right;  CDE:8.30  . CERVICAL FUSION    . CHOLECYSTECTOMY    . COLONOSCOPY N/A 11/03/2013   SLF: 1. Normal mucosa in the terminal ileum. 2. 13 colon polyps removed. 3. Moderate diverticulosis in the descending colon and sigmoid colon 4. the left colon is redundant 5. Small internal  hemorrhoids.  . COLONOSCOPY N/A 09/25/2017   Procedure: COLONOSCOPY;  Surgeon: Rogene Houston, MD;  [facility];  Service: Endoscopy;  Laterality: N/A;  1:55  . ESOPHAGOGASTRODUODENOSCOPY N/A 11/03/2013   SLF: 1. 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 Mild Non-erosive gastritis  . 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 s/p gastric bypass  . EYE SURGERY    . GASTRIC BYPASS  1979   revision in 1980   . HIATAL HERNIA REPAIR     [facility] in 2011  . JOINT REPLACEMENT Right 1995   hip  . JOINT REPLACEMENT Left 2008   hip  . MEDIAL PARTIAL KNEE REPLACEMENT Left    Dr Ronnie Derby  . PARAESOPHAGEAL HERNIA REPAIR  SEP 2010 DR. MCNATT  . POLYPECTOMY  09/25/2017   Procedure: POLYPECTOMY;  Surgeon: Rogene Houston, MD;  [facility];  Service: Endoscopy;;  colon  . SHOULDER ARTHROSCOPY Right   . SHOULDER ARTHROSCOPY Right    x2  . SKIN BIOPSY    . TONSILLECTOMY    . TOTAL KNEE ARTHROPLASTY Right 09/08/2018   Procedure: RIGHT TOTAL KNEE ARTHROPLASTY;  Surgeon: Marybelle Killings, MD;  Location: [place];  Service: Orthopedics;  Laterality: Right;  . TOTAL SHOULDER ARTHROPLASTY Right 02/16/2013   Procedure: TOTAL SHOULDER ARTHROPLASTY;  Surgeon: Marybelle Killings, MD;  Location: [place];  Service: Orthopedics;  Laterality: Right;  Right Total Shoulder Arthroplasty, Cemented   Social History   Occupational History  . Not on file  Tobacco Use  . Smoking status: Never Smoker  . Smokeless  tobacco: Never Used  Substance and Sexual Activity  . Alcohol use: No  . Drug use: No  . Sexual activity: Yes    Birth control/protection: Surgical

## 2018-10-24 ENCOUNTER — Encounter: Payer: Self-pay | Admitting: Orthopaedic Surgery

## 2019-01-21 ENCOUNTER — Ambulatory Visit: Payer: Medicare Other | Admitting: Orthopaedic Surgery

## 2019-01-22 ENCOUNTER — Inpatient Hospital Stay: Payer: Medicare Other | Admitting: Orthopaedic Surgery

## 2019-02-05 ENCOUNTER — Encounter: Payer: Self-pay | Admitting: Orthopaedic Surgery

## 2019-02-05 ENCOUNTER — Other Ambulatory Visit: Payer: Self-pay

## 2019-02-05 ENCOUNTER — Ambulatory Visit (INDEPENDENT_AMBULATORY_CARE_PROVIDER_SITE_OTHER): Payer: Medicare Other | Admitting: Orthopaedic Surgery

## 2019-02-05 VITALS — BP 157/77 | HR 69 | Ht 63.0 in | Wt 214.0 lb

## 2019-02-05 DIAGNOSIS — M19012 Primary osteoarthritis, left shoulder: Secondary | ICD-10-CM

## 2019-02-05 DIAGNOSIS — M7542 Impingement syndrome of left shoulder: Secondary | ICD-10-CM | POA: Diagnosis not present

## 2019-02-05 DIAGNOSIS — Z96651 Presence of right artificial knee joint: Secondary | ICD-10-CM

## 2019-02-05 NOTE — Progress Notes (Signed)
Office Visit Note   Patient: [name] [name]           Date of Birth: 1940-04-25           MRN: [id number] Visit Date: 02/05/2019              Requested by: Curlene Labrum, MD [place],  [place] [id number] PCP: Curlene Labrum, MD   Assessment & Plan: Visit Diagnoses:  1. Impingement syndrome of left shoulder   2. Primary osteoarthritis, left shoulder   3. S/P total knee arthroplasty, right     Plan: Injection performed left shoulder.  She can follow-up if she has continued problems.  We discussed continue working on right quad strengthening to improve her knee symptoms on the right.  We reviewed previous x-rays of her knee which shows good position and alignment.  Follow-Up Instructions: Return if symptoms worsen or fail to improve.   Orders:  Orders Placed This Encounter  Procedures  . Large Joint Inj: L subacromial bursa   No orders of the defined types were placed in this encounter.     Procedures: Large Joint Inj: L subacromial bursa on 02/05/2019 3:15 PM Indications: pain Details: 22 G 1.5 in needle  Arthrogram: No  Medications: 4 mL bupivacaine 0.25 %; 40 mg methylPREDNISolone acetate 40 MG/ML; 0.5 mL lidocaine 1 % Outcome: tolerated well, no immediate complications Procedure, treatment alternatives, risks and benefits explained, specific risks discussed. Consent was given by the patient. Immediately prior to procedure a time out was called to verify the correct patient, procedure, equipment, support staff and site/side marked as required. Patient was prepped and draped in the usual sterile fashion.       Clinical Data: No additional findings.   Subjective: Chief Complaint  Patient presents with  . Right Knee - Follow-up    09/08/2018 Right TKA  . Left Shoulder - Pain    HPI 78 year old female returns post right total knee arthroplasty 09/08/2018.  She states her right knee still bothers her at times she still has quad weakness and has bilateral  venous stasis problems.  She has had previous vein stripping x2 with some dimpling of the subcutaneous tissue.  Some pretibial darkening discoloration without active ulceration.  She has had significant problems with her left shoulder since she has to use her arms to push her way up.  Patient is requesting an injection in her left shoulder which previously gave her good relief back in June 2020.  Review of Systems 14 point systems update unchanged from her July knee surgery.   Objective: Vital Signs: BP (!) 157/77   Pulse 69   Ht 5\' 3"  (1.6 m)   Wt 214 lb (97.1 kg)   BMI 37.91 kg/m   Physical Exam Constitutional:      Appearance: She is well-developed.  HENT:     Head: Normocephalic.     Right Ear: External ear normal.     Left Ear: External ear normal.  Eyes:     Pupils: Pupils are equal, round, and reactive to light.  Neck:     Thyroid: No thyromegaly.     Trachea: No tracheal deviation.  Cardiovascular:     Rate and Rhythm: Normal rate.  Pulmonary:     Effort: Pulmonary effort is normal.  Abdominal:     Palpations: Abdomen is soft.  Skin:    General: Skin is warm and dry.  Neurological:     Mental Status: She is alert  and oriented to person, place, and time.  Psychiatric:        Behavior: Behavior normal.     Ortho Exam well-healed knee incision she still has some quad weakness improved from last visit but still with residual quad weakness.  Collateral ligaments are stable.  Left shoulder shows positive impingement pain with resisted supraspinatus testing negative crossarm adduction test.  Upper extremity reflexes are 2+ and symmetrical negative [place].  Specialty Comments:  No specialty comments available.  Imaging: No results found.   PMFS History: Patient Active Problem List   Diagnosis Date Noted  . Impingement syndrome of left shoulder 02/06/2019  . S/P total knee arthroplasty, 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  . Primary osteoarthritis, 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  .  History of arthroplasty of right shoulder 08/14/2018  . History of colonic polyps 08/14/2017  . Choledocholithiasis   . Abnormal CT scan, esophagus   . Cholestasis 02/05/2017  . Depression 02/05/2017  . Hypomagnesemia 02/05/2017  . Bradycardia 02/05/2017  . Transaminitis 07/08/2016  . Constipation 07/08/2016  . GIB (gastrointestinal bleeding) 07/08/2016  . HCAP (healthcare-associated pneumonia) 09/23/2015  . AKI (acute kidney injury) ([place]) 07/21/2015  . Hypokalemia 07/21/2015  . CAP (community acquired pneumonia) 07/14/2015  . Upper abdominal pain 06/10/2014  . Folliculitis of perineum 02/24/2014  . Giant comedone 12/21/2013  . 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 Acrochordon 11/25/2013  . Dysphagia 10/29/2013  . Class 2 severe obesity due to excess calories with serious comorbidity and body mass index (BMI) of 38.0 to 38.9 in adult ([place]) 10/02/2013  . Primary hypertension   . GERD (gastroesophageal reflux disease)   . Anxiety   . Anemia of chronic disease    Past Medical History:  Diagnosis Date  . Anemia   . Anxiety   . Arthritis   . Barrett's esophagus    seen on 2016 [facility]d [facility]  . Depression   . GERD (gastroesophageal reflux disease)   . H/O hiatal hernia   . Hypertension   . Obesity   . Pneumonia   . PONV (postoperative nausea and vomiting)   . Seasonal allergies   . Sepsis ([place]) 10/02/2013    Family History  Problem Relation Age of Onset  . Colon cancer Brother        IN HIS 60s-METASTATIC  . Colon polyps Paternal Grandfather   . Breast cancer Mother   . Hypertension Father   . Heart disease Father   . Hypertension Brother   . Heart disease Brother     Past Surgical History:  Procedure Laterality Date  . ABDOMINAL HYSTERECTOMY    . ABDOMINAL SURGERY    . APPENDECTOMY    . BACK SURGERY     lumbar x2 by Dr Arnoldo Morale  . CARPAL TUNNEL RELEASE Right 03/2014   Dr. Lorin Mercy  . CATARACT EXTRACTION W/PHACO Left 08/06/2013   Procedure: CATARACT  EXTRACTION PHACO AND INTRAOCULAR LENS PLACEMENT LEFT EYE;  Surgeon: Tonny Branch, MD;  [facility];  Service: Ophthalmology;  Laterality: Left;  CDE: 9.55  . CATARACT EXTRACTION W/PHACO Right 08/24/2013   Procedure: CATARACT EXTRACTION PHACO AND INTRAOCULAR LENS PLACEMENT (IOC);  Surgeon: Tonny Branch, MD;  Location: [facility];  Service: Ophthalmology;  Laterality: Right;  CDE:8.30  . CERVICAL FUSION    . CHOLECYSTECTOMY    . COLONOSCOPY N/A 11/03/2013   SLF: 1. Normal mucosa in the terminal ileum. 2. 13 colon polyps removed. 3. Moderate diverticulosis in the descending colon and sigmoid colon 4. the left colon is redundant  5. Small internal  hemorrhoids.  . COLONOSCOPY N/A 09/25/2017   Procedure: COLONOSCOPY;  Surgeon: Rogene Houston, MD;  [facility];  Service: Endoscopy;  Laterality: N/A;  1:55  . ESOPHAGOGASTRODUODENOSCOPY N/A 11/03/2013   SLF: 1. 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 Mild Non-erosive gastritis  . 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 s/p gastric bypass  . EYE SURGERY    . GASTRIC BYPASS  1979   revision in 1980   . HIATAL HERNIA REPAIR     [facility] in 2011  . JOINT REPLACEMENT Right 1995   hip  . JOINT REPLACEMENT Left 2008   hip  . MEDIAL PARTIAL KNEE REPLACEMENT Left    Dr Ronnie Derby  . PARAESOPHAGEAL HERNIA REPAIR  SEP 2010 DR. MCNATT  . POLYPECTOMY  09/25/2017   Procedure: POLYPECTOMY;  Surgeon: Rogene Houston, MD;  [facility];  Service: Endoscopy;;  colon  . SHOULDER ARTHROSCOPY Right   . SHOULDER ARTHROSCOPY Right    x2  . SKIN BIOPSY    . TONSILLECTOMY    . TOTAL KNEE ARTHROPLASTY Right 09/08/2018   Procedure: RIGHT TOTAL KNEE ARTHROPLASTY;  Surgeon: Marybelle Killings, MD;  Location: [facility];  Service: Orthopedics;  Laterality: Right;  . TOTAL SHOULDER  ARTHROPLASTY Right 02/16/2013   Procedure: TOTAL SHOULDER ARTHROPLASTY;  Surgeon: Marybelle Killings, MD;  Location: [place];  Service: Orthopedics;  Laterality: Right;  Right Total Shoulder Arthroplasty, Cemented   Social History   Occupational History  . Not on file  Tobacco Use  . Smoking status: Never Smoker  . Smokeless tobacco: Never Used  Substance and Sexual Activity  . Alcohol use: No  . Drug use: No  . Sexual activity: Yes    Birth control/protection: Surgical

## 2019-02-06 DIAGNOSIS — M7542 Impingement syndrome of left shoulder: Secondary | ICD-10-CM | POA: Insufficient documentation

## 2019-02-06 MED ORDER — BUPIVACAINE HCL 0.25 % IJ SOLN
4.0000 mL | INTRAMUSCULAR | Status: AC | PRN
  Administered 2019-02-05: 4 mL via INTRA_ARTICULAR

## 2019-02-06 MED ORDER — LIDOCAINE HCL 1 % IJ SOLN
0.5000 mL | INTRAMUSCULAR | Status: AC | PRN
  Administered 2019-02-05: .5 mL

## 2019-02-06 MED ORDER — METHYLPREDNISOLONE ACETATE 40 MG/ML IJ SUSP
40.0000 mg | INTRAMUSCULAR | Status: AC | PRN
  Administered 2019-02-05: 15:00:00 40 mg via INTRA_ARTICULAR

## 2019-04-16 DIAGNOSIS — J0101 Acute recurrent maxillary sinusitis: Secondary | ICD-10-CM | POA: Diagnosis not present

## 2019-04-16 DIAGNOSIS — J209 Acute bronchitis, unspecified: Secondary | ICD-10-CM | POA: Diagnosis not present

## 2019-05-11 ENCOUNTER — Ambulatory Visit (INDEPENDENT_AMBULATORY_CARE_PROVIDER_SITE_OTHER): Payer: Medicare Other | Admitting: Nurse Practitioner

## 2019-05-18 DIAGNOSIS — Z85828 Personal history of other malignant neoplasm of skin: Secondary | ICD-10-CM | POA: Diagnosis not present

## 2019-05-18 DIAGNOSIS — L814 Other melanin hyperpigmentation: Secondary | ICD-10-CM | POA: Diagnosis not present

## 2019-05-18 DIAGNOSIS — L570 Actinic keratosis: Secondary | ICD-10-CM | POA: Diagnosis not present

## 2019-05-18 DIAGNOSIS — L304 Erythema intertrigo: Secondary | ICD-10-CM | POA: Diagnosis not present

## 2019-05-18 DIAGNOSIS — L821 Other seborrheic keratosis: Secondary | ICD-10-CM | POA: Diagnosis not present

## 2019-07-10 DIAGNOSIS — R0602 Shortness of breath: Secondary | ICD-10-CM | POA: Diagnosis not present

## 2019-07-10 DIAGNOSIS — Z20822 Contact with and (suspected) exposure to covid-19: Secondary | ICD-10-CM | POA: Diagnosis not present

## 2019-07-10 DIAGNOSIS — R042 Hemoptysis: Secondary | ICD-10-CM | POA: Diagnosis not present

## 2019-07-10 DIAGNOSIS — R0902 Hypoxemia: Secondary | ICD-10-CM | POA: Diagnosis not present

## 2019-07-10 DIAGNOSIS — F329 Major depressive disorder, single episode, unspecified: Secondary | ICD-10-CM | POA: Diagnosis not present

## 2019-07-10 DIAGNOSIS — Z880 Allergy status to penicillin: Secondary | ICD-10-CM | POA: Diagnosis not present

## 2019-07-10 DIAGNOSIS — J189 Pneumonia, unspecified organism: Secondary | ICD-10-CM | POA: Diagnosis not present

## 2019-07-10 DIAGNOSIS — I10 Essential (primary) hypertension: Secondary | ICD-10-CM | POA: Diagnosis not present

## 2019-07-10 DIAGNOSIS — J155 Pneumonia due to Escherichia coli: Secondary | ICD-10-CM | POA: Diagnosis not present

## 2019-07-10 DIAGNOSIS — K219 Gastro-esophageal reflux disease without esophagitis: Secondary | ICD-10-CM | POA: Diagnosis not present

## 2019-07-10 DIAGNOSIS — E876 Hypokalemia: Secondary | ICD-10-CM | POA: Diagnosis not present

## 2019-07-15 ENCOUNTER — Ambulatory Visit (INDEPENDENT_AMBULATORY_CARE_PROVIDER_SITE_OTHER): Payer: Medicare Other | Admitting: Gastroenterology

## 2019-07-18 DIAGNOSIS — F329 Major depressive disorder, single episode, unspecified: Secondary | ICD-10-CM | POA: Diagnosis not present

## 2019-07-18 DIAGNOSIS — K219 Gastro-esophageal reflux disease without esophagitis: Secondary | ICD-10-CM | POA: Diagnosis not present

## 2019-07-18 DIAGNOSIS — Z7982 Long term (current) use of aspirin: Secondary | ICD-10-CM | POA: Diagnosis not present

## 2019-07-18 DIAGNOSIS — D649 Anemia, unspecified: Secondary | ICD-10-CM | POA: Diagnosis not present

## 2019-07-18 DIAGNOSIS — I10 Essential (primary) hypertension: Secondary | ICD-10-CM | POA: Diagnosis not present

## 2019-07-18 DIAGNOSIS — J155 Pneumonia due to Escherichia coli: Secondary | ICD-10-CM | POA: Diagnosis not present

## 2019-07-18 DIAGNOSIS — M1990 Unspecified osteoarthritis, unspecified site: Secondary | ICD-10-CM | POA: Diagnosis not present

## 2019-07-18 DIAGNOSIS — Z9181 History of falling: Secondary | ICD-10-CM | POA: Diagnosis not present

## 2019-07-18 DIAGNOSIS — W19XXXD Unspecified fall, subsequent encounter: Secondary | ICD-10-CM | POA: Diagnosis not present

## 2019-07-22 DIAGNOSIS — Z9181 History of falling: Secondary | ICD-10-CM | POA: Diagnosis not present

## 2019-07-22 DIAGNOSIS — I10 Essential (primary) hypertension: Secondary | ICD-10-CM | POA: Diagnosis not present

## 2019-07-22 DIAGNOSIS — Z7982 Long term (current) use of aspirin: Secondary | ICD-10-CM | POA: Diagnosis not present

## 2019-07-22 DIAGNOSIS — K219 Gastro-esophageal reflux disease without esophagitis: Secondary | ICD-10-CM | POA: Diagnosis not present

## 2019-07-22 DIAGNOSIS — F329 Major depressive disorder, single episode, unspecified: Secondary | ICD-10-CM | POA: Diagnosis not present

## 2019-07-22 DIAGNOSIS — W19XXXD Unspecified fall, subsequent encounter: Secondary | ICD-10-CM | POA: Diagnosis not present

## 2019-07-22 DIAGNOSIS — D649 Anemia, unspecified: Secondary | ICD-10-CM | POA: Diagnosis not present

## 2019-07-22 DIAGNOSIS — J155 Pneumonia due to Escherichia coli: Secondary | ICD-10-CM | POA: Diagnosis not present

## 2019-07-22 DIAGNOSIS — M1990 Unspecified osteoarthritis, unspecified site: Secondary | ICD-10-CM | POA: Diagnosis not present

## 2019-07-23 DIAGNOSIS — I10 Essential (primary) hypertension: Secondary | ICD-10-CM | POA: Diagnosis not present

## 2019-07-23 DIAGNOSIS — N182 Chronic kidney disease, stage 2 (mild): Secondary | ICD-10-CM | POA: Diagnosis not present

## 2019-07-23 DIAGNOSIS — Z6839 Body mass index (BMI) 39.0-39.9, adult: Secondary | ICD-10-CM | POA: Diagnosis not present

## 2019-07-23 DIAGNOSIS — F339 Major depressive disorder, recurrent, unspecified: Secondary | ICD-10-CM | POA: Diagnosis not present

## 2019-07-23 DIAGNOSIS — E876 Hypokalemia: Secondary | ICD-10-CM | POA: Diagnosis not present

## 2019-07-23 DIAGNOSIS — I5032 Chronic diastolic (congestive) heart failure: Secondary | ICD-10-CM | POA: Diagnosis not present

## 2019-07-23 DIAGNOSIS — E559 Vitamin D deficiency, unspecified: Secondary | ICD-10-CM | POA: Diagnosis not present

## 2019-07-23 DIAGNOSIS — J155 Pneumonia due to Escherichia coli: Secondary | ICD-10-CM | POA: Diagnosis not present

## 2019-07-23 DIAGNOSIS — G4700 Insomnia, unspecified: Secondary | ICD-10-CM | POA: Diagnosis not present

## 2019-07-23 DIAGNOSIS — R042 Hemoptysis: Secondary | ICD-10-CM | POA: Diagnosis not present

## 2019-07-24 DIAGNOSIS — I10 Essential (primary) hypertension: Secondary | ICD-10-CM | POA: Diagnosis not present

## 2019-07-24 DIAGNOSIS — J155 Pneumonia due to Escherichia coli: Secondary | ICD-10-CM | POA: Diagnosis not present

## 2019-07-24 DIAGNOSIS — K219 Gastro-esophageal reflux disease without esophagitis: Secondary | ICD-10-CM | POA: Diagnosis not present

## 2019-07-24 DIAGNOSIS — D649 Anemia, unspecified: Secondary | ICD-10-CM | POA: Diagnosis not present

## 2019-07-24 DIAGNOSIS — W19XXXD Unspecified fall, subsequent encounter: Secondary | ICD-10-CM | POA: Diagnosis not present

## 2019-07-24 DIAGNOSIS — F329 Major depressive disorder, single episode, unspecified: Secondary | ICD-10-CM | POA: Diagnosis not present

## 2019-07-24 DIAGNOSIS — M1990 Unspecified osteoarthritis, unspecified site: Secondary | ICD-10-CM | POA: Diagnosis not present

## 2019-07-24 DIAGNOSIS — Z9181 History of falling: Secondary | ICD-10-CM | POA: Diagnosis not present

## 2019-07-24 DIAGNOSIS — Z7982 Long term (current) use of aspirin: Secondary | ICD-10-CM | POA: Diagnosis not present

## 2019-07-27 DIAGNOSIS — M1990 Unspecified osteoarthritis, unspecified site: Secondary | ICD-10-CM | POA: Diagnosis not present

## 2019-07-27 DIAGNOSIS — W19XXXD Unspecified fall, subsequent encounter: Secondary | ICD-10-CM | POA: Diagnosis not present

## 2019-07-27 DIAGNOSIS — J155 Pneumonia due to Escherichia coli: Secondary | ICD-10-CM | POA: Diagnosis not present

## 2019-07-27 DIAGNOSIS — F329 Major depressive disorder, single episode, unspecified: Secondary | ICD-10-CM | POA: Diagnosis not present

## 2019-07-27 DIAGNOSIS — K219 Gastro-esophageal reflux disease without esophagitis: Secondary | ICD-10-CM | POA: Diagnosis not present

## 2019-07-27 DIAGNOSIS — D649 Anemia, unspecified: Secondary | ICD-10-CM | POA: Diagnosis not present

## 2019-07-27 DIAGNOSIS — Z7982 Long term (current) use of aspirin: Secondary | ICD-10-CM | POA: Diagnosis not present

## 2019-07-27 DIAGNOSIS — I10 Essential (primary) hypertension: Secondary | ICD-10-CM | POA: Diagnosis not present

## 2019-07-27 DIAGNOSIS — Z9181 History of falling: Secondary | ICD-10-CM | POA: Diagnosis not present

## 2019-07-28 DIAGNOSIS — D649 Anemia, unspecified: Secondary | ICD-10-CM | POA: Diagnosis not present

## 2019-07-28 DIAGNOSIS — K219 Gastro-esophageal reflux disease without esophagitis: Secondary | ICD-10-CM | POA: Diagnosis not present

## 2019-07-28 DIAGNOSIS — F329 Major depressive disorder, single episode, unspecified: Secondary | ICD-10-CM | POA: Diagnosis not present

## 2019-07-28 DIAGNOSIS — I10 Essential (primary) hypertension: Secondary | ICD-10-CM | POA: Diagnosis not present

## 2019-07-28 DIAGNOSIS — Z7982 Long term (current) use of aspirin: Secondary | ICD-10-CM | POA: Diagnosis not present

## 2019-07-28 DIAGNOSIS — J155 Pneumonia due to Escherichia coli: Secondary | ICD-10-CM | POA: Diagnosis not present

## 2019-07-28 DIAGNOSIS — W19XXXD Unspecified fall, subsequent encounter: Secondary | ICD-10-CM | POA: Diagnosis not present

## 2019-07-28 DIAGNOSIS — Z9181 History of falling: Secondary | ICD-10-CM | POA: Diagnosis not present

## 2019-07-28 DIAGNOSIS — M1990 Unspecified osteoarthritis, unspecified site: Secondary | ICD-10-CM | POA: Diagnosis not present

## 2019-07-31 DIAGNOSIS — J155 Pneumonia due to Escherichia coli: Secondary | ICD-10-CM | POA: Diagnosis not present

## 2019-07-31 DIAGNOSIS — K219 Gastro-esophageal reflux disease without esophagitis: Secondary | ICD-10-CM | POA: Diagnosis not present

## 2019-07-31 DIAGNOSIS — W19XXXD Unspecified fall, subsequent encounter: Secondary | ICD-10-CM | POA: Diagnosis not present

## 2019-07-31 DIAGNOSIS — M1990 Unspecified osteoarthritis, unspecified site: Secondary | ICD-10-CM | POA: Diagnosis not present

## 2019-07-31 DIAGNOSIS — Z9181 History of falling: Secondary | ICD-10-CM | POA: Diagnosis not present

## 2019-07-31 DIAGNOSIS — Z7982 Long term (current) use of aspirin: Secondary | ICD-10-CM | POA: Diagnosis not present

## 2019-07-31 DIAGNOSIS — D649 Anemia, unspecified: Secondary | ICD-10-CM | POA: Diagnosis not present

## 2019-07-31 DIAGNOSIS — F329 Major depressive disorder, single episode, unspecified: Secondary | ICD-10-CM | POA: Diagnosis not present

## 2019-07-31 DIAGNOSIS — I10 Essential (primary) hypertension: Secondary | ICD-10-CM | POA: Diagnosis not present

## 2019-08-15 DIAGNOSIS — J155 Pneumonia due to Escherichia coli: Secondary | ICD-10-CM | POA: Diagnosis not present

## 2019-08-15 DIAGNOSIS — G4700 Insomnia, unspecified: Secondary | ICD-10-CM | POA: Diagnosis not present

## 2019-08-15 DIAGNOSIS — I10 Essential (primary) hypertension: Secondary | ICD-10-CM | POA: Diagnosis not present

## 2019-08-15 DIAGNOSIS — I5032 Chronic diastolic (congestive) heart failure: Secondary | ICD-10-CM | POA: Diagnosis not present

## 2019-08-15 DIAGNOSIS — N182 Chronic kidney disease, stage 2 (mild): Secondary | ICD-10-CM | POA: Diagnosis not present

## 2019-08-15 DIAGNOSIS — F339 Major depressive disorder, recurrent, unspecified: Secondary | ICD-10-CM | POA: Diagnosis not present

## 2019-08-15 DIAGNOSIS — R042 Hemoptysis: Secondary | ICD-10-CM | POA: Diagnosis not present

## 2019-08-15 DIAGNOSIS — E876 Hypokalemia: Secondary | ICD-10-CM | POA: Diagnosis not present

## 2019-08-20 ENCOUNTER — Encounter (INDEPENDENT_AMBULATORY_CARE_PROVIDER_SITE_OTHER): Payer: Self-pay | Admitting: Gastroenterology

## 2019-08-20 ENCOUNTER — Ambulatory Visit (INDEPENDENT_AMBULATORY_CARE_PROVIDER_SITE_OTHER): Payer: Medicare Other | Admitting: Gastroenterology

## 2019-08-20 ENCOUNTER — Other Ambulatory Visit: Payer: Self-pay

## 2019-08-20 VITALS — BP 187/96 | HR 84 | Temp 99.1°F | Ht 63.0 in | Wt 223.2 lb

## 2019-08-20 DIAGNOSIS — R1310 Dysphagia, unspecified: Secondary | ICD-10-CM | POA: Diagnosis not present

## 2019-08-20 DIAGNOSIS — K920 Hematemesis: Secondary | ICD-10-CM

## 2019-08-20 NOTE — Patient Instructions (Addendum)
Recheck BP at home - if high let Dr Pleas Koch know  -We will call to schedule endoscopy -We will request labs from Dr Pleas Koch

## 2019-08-20 NOTE — Progress Notes (Signed)
Patient profile: [name] [name] is a 79 y.o. female seen for evaluation of ER f/up . Last seen in clinic on 05/2018  History of Present Illness: [name] [name] is seen today for follow up. 3 weeks ago was seen at [facility]. She went to hospital for hematemesis - 2 episodes of vomiting BRB then eventually was vomiting "light red" emesis, about 4-5 episodes total prior to calling EMS. She reports was feeling "run down" prior to waking up with the vomiting. She was diagnosed with pneumonia as well after her admission and was discharged w/ oxygen but no longer using. Reports vomiting up to half cup of blood. Reports this was first episode of hematemesis. She has not had further vomiting since discharge.  She reports her breathing is better. Still having issues w/ feeling something stuck in her upper esophagus consistently but denies dysphagia to pills. Reports occasional food dysphagia-occurs more in lower esophagus and will pass w/ standing up. Takes omeprazole 20mg  bid - no GERD symptoms while on PPI. Still having some mild nausea if eats food such as pizza. Takes zofran PRN.   Can have issues w/ diarrhea chronically - particularly after certain foods (breakfast, pizza). Other times will have constipation w/ no BM for 4 days. Tried linzess which caused abd pain. Has had issues w/ regularity since gastric surgery, uncharged recently. Takes stool softener daily. Denies any blood in stool.   No NSAIDS. No tobacco or alcohol.   Wt Readings from Last 3 Encounters:  08/20/19 223 lb 3.2 oz (101.2 kg)  02/05/19 214 lb (97.1 kg)  10/24/18 214 lb (97.1 kg)     Last Colonoscopy: 2019--7 sessile polyp sigmoid, 2 sessile polyps sigmoid and ascending, 30mm polyp mid sigmoid. 3 year repeat recommended. 10 total polyps removed.   Last Endoscopy: 2015   Past Medical History:  Past Medical History:  Diagnosis Date  . Anemia   . Anxiety   . Arthritis   . Barrett's esophagus    seen on [facility] egd at [facility]    . Depression   . GERD (gastroesophageal reflux disease)   . H/O hiatal hernia   . Hypertension   . Obesity   . Pneumonia   . PONV (postoperative nausea and vomiting)   . Seasonal allergies   . Sepsis ([place]) 10/02/2013    Problem List: Patient Active Problem List   Diagnosis Date Noted  . Impingement syndrome of left shoulder 02/06/2019  . S/P total knee arthroplasty, 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  . Primary osteoarthritis, 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  . History of arthroplasty of right shoulder 08/14/2018  . History of colonic polyps 08/14/2017  . Choledocholithiasis   . Abnormal CT scan, esophagus   . Cholestasis 02/05/2017  . Depression 02/05/2017  . Hypomagnesemia 02/05/2017  . Bradycardia 02/05/2017  . Transaminitis 07/08/2016  . Constipation 07/08/2016  . GIB (gastrointestinal bleeding) 07/08/2016  . HCAP (healthcare-associated pneumonia) 09/23/2015  . AKI (acute kidney injury) ([place]) 07/21/2015  . Hypokalemia 07/21/2015  . CAP (community acquired pneumonia) 07/14/2015  . Upper abdominal pain 06/10/2014  . Folliculitis of perineum 02/24/2014  . Giant comedone 12/21/2013  . 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 Acrochordon 11/25/2013  . Dysphagia 10/29/2013  . Class 2 severe obesity due to excess calories with serious comorbidity and body mass index (BMI) of 38.0 to 38.9 in adult ([place]) 10/02/2013  . Primary hypertension   . GERD (gastroesophageal reflux disease)   . Anxiety   . Anemia of chronic disease  Past Surgical History: Past Surgical History:  Procedure Laterality Date  . ABDOMINAL HYSTERECTOMY    . ABDOMINAL SURGERY    . APPENDECTOMY    . BACK SURGERY     lumbar x2 by Dr Arnoldo Morale  . CARPAL TUNNEL RELEASE Right 03/2014   Dr. Lorin Mercy  . CATARACT EXTRACTION W/PHACO Left 08/06/2013   Procedure: CATARACT EXTRACTION PHACO AND INTRAOCULAR LENS PLACEMENT LEFT EYE;  Surgeon: Tonny Branch, MD;  Location: [facility];  Service: Ophthalmology;  Laterality: Left;  CDE: 9.55   . CATARACT EXTRACTION W/PHACO Right 08/24/2013   Procedure: CATARACT EXTRACTION PHACO AND INTRAOCULAR LENS PLACEMENT (IOC);  Surgeon: Tonny Branch, MD;  Location: [facility];  Service: Ophthalmology;  Laterality: Right;  CDE:8.30  . CERVICAL FUSION    . CHOLECYSTECTOMY    . COLONOSCOPY N/A 11/03/2013   SLF: 1. Normal mucosa in the terminal ileum. 2. 13 colon polyps removed. 3. Moderate diverticulosis in the descending colon and sigmoid colon 4. the left colon is redundant 5. Small internal  hemorrhoids.  . COLONOSCOPY N/A 09/25/2017   Procedure: COLONOSCOPY;  Surgeon: Rogene Houston, MD;  Location: [facility];  Service: Endoscopy;  Laterality: N/A;  1:55  . ESOPHAGOGASTRODUODENOSCOPY N/A 11/03/2013   SLF: 1. 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 Mild Non-erosive gastritis  . 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 s/p gastric bypass  . EYE SURGERY    . GASTRIC BYPASS  1979   revision in 1980   . HIATAL HERNIA REPAIR     [facility] in 2011  . JOINT REPLACEMENT Right 1995   hip  . JOINT REPLACEMENT Left 2008   hip  . MEDIAL PARTIAL KNEE REPLACEMENT Left    Dr Ronnie Derby  . PARAESOPHAGEAL HERNIA REPAIR  SEP 2010 DR. MCNATT  . POLYPECTOMY  09/25/2017   Procedure: POLYPECTOMY;  Surgeon: Rogene Houston, MD;  [facility];  Service: Endoscopy;;  colon  . SHOULDER ARTHROSCOPY Right   . SHOULDER ARTHROSCOPY Right    x2  . SKIN BIOPSY    . TONSILLECTOMY    . TOTAL KNEE ARTHROPLASTY Right 09/08/2018   Procedure: RIGHT TOTAL KNEE ARTHROPLASTY;  Surgeon: Marybelle Killings, MD;  Location: [place];  Service: Orthopedics;  Laterality: Right;  . TOTAL SHOULDER ARTHROPLASTY Right 02/16/2013   Procedure: TOTAL SHOULDER ARTHROPLASTY;  Surgeon: Marybelle Killings, MD;  Location: [place];  Service: Orthopedics;  Laterality: Right;   Right Total Shoulder Arthroplasty, Cemented    Allergies: Allergies  Allergen Reactions  . Novocain [Procaine Hcl] Other (See Comments)    Unknown-passed out with novocaine injected for dental surgery.  . Other Hives and Other (See Comments)    EKG pads - need to use pediatric pads  . Latex Rash  . Penicillins Rash and Other (See Comments)    Has patient had a PCN reaction causing immediate rash, facial/tongue/throat swelling, SOB or lightheadedness with hypotension: Yes Has patient had a PCN reaction causing severe rash involving mucus membranes or skin necrosis: No Has patient had a PCN reaction that required hospitalization No Has patient had a PCN reaction occurring within the last 10 years: No  If all of the above answers are "NO", then may proceed with Cephalosporin use.   . Sulfa Antibiotics Rash      Home Medications:  Current Outpatient Medications:  .  albuterol (PROVENTIL) (2.5  MG/3ML) 0.083% nebulizer solution, Take 3 mLs (2.5 mg total) by nebulization every 4 (four) hours as needed for wheezing. (Patient taking differently: Take 2.5 mg by nebulization daily as needed for wheezing or shortness of breath. ), Disp: 75 mL, Rfl: 12 .  alendronate (FOSAMAX) 70 MG tablet, Take 70 mg by mouth once a week., Disp: , Rfl:  .  AZO-CRANBERRY PO, Take by mouth as needed., Disp: , Rfl:  .  Biotin w/ Vitamins C & E (HAIR/SKIN/NAILS PO), Take 3 tablets by mouth daily. , Disp: , Rfl:  .  Calcium-Phosphorus-Vitamin D (CITRACAL +D3 PO), Take 1 tablet by mouth daily., Disp: , Rfl:  .  Cholecalciferol (VITAMIN D3) 50 MCG (2000 UT) capsule, Take 2,000 Units by mouth 2 (two) times a day. , Disp: , Rfl:  .  dextromethorphan-guaiFENesin (MUCINEX DM) 30-600 MG 12hr tablet, Take 1 tablet by mouth 2 (two) times daily as needed for cough. , Disp: , Rfl:  .  docusate sodium (COLACE) 100 MG capsule, Take 100 mg by mouth at bedtime as needed for mild constipation. , Disp: , Rfl:  .   HYDROcodone-acetaminophen (NORCO) 7.5-325 MG tablet, Take 1-2 tablets by mouth every 6 (six) hours as needed for severe pain (pain score 7-10)., Disp: 50 tablet, Rfl: 0 .  HYDROcodone-acetaminophen (NORCO/VICODIN) 5-325 MG tablet, Take 1 tablet by mouth every 6 (six) hours as needed for moderate pain., Disp: 30 tablet, Rfl: 0 .  nystatin ointment (MYCOSTATIN), Apply 1 application topically 2 (two) times daily as needed (for irritation)., Disp: , Rfl:  .  omeprazole (PRILOSEC) 20 MG capsule, Take 20 mg by mouth 2 (two) times daily before a meal., Disp: , Rfl:  .  ondansetron (ZOFRAN) 4 MG tablet, Take 4 mg by mouth every 8 (eight) hours as needed for nausea or vomiting., Disp: , Rfl:  .  Probiotic Product (PROBIOTIC PO), Take 1 capsule by mouth daily., Disp: , Rfl:  .  sertraline (ZOLOFT) 100 MG tablet, Take 100 mg by mouth daily., Disp: , Rfl:  .  traZODone (DESYREL) 50 MG tablet, Take 50 mg by mouth at bedtime., Disp: , Rfl:  .  lisinopril (ZESTRIL) 5 MG tablet, Take 1 tablet (5 mg total) by mouth daily. (Patient not taking: Reported on 08/20/2019), Disp: 30 tablet, Rfl: 0 .  metoprolol succinate (TOPROL-XL) 25 MG 24 hr tablet, Take 1 tablet (25 mg total) by mouth daily. (Patient not taking: Reported on 08/20/2019), Disp:  , Rfl:    Family History: family history includes Breast cancer in her mother; Colon cancer in her brother; Colon polyps in her paternal grandfather; Heart disease in her brother and father; Hypertension in her brother and father.    Social History:   reports that she has never smoked. She has never used smokeless tobacco. She reports that she does not drink alcohol and does not use drugs.   Review of Systems: Constitutional: Denies weight loss/weight gain  Eyes: No changes in vision. ENT: No oral lesions, sore throat.  GI: see HPI.  Heme/Lymph: No easy bruising.  CV: No chest pain.  GU: No hematuria.  Integumentary: No rashes.  Neuro: No headaches.  Psych: No  depression/anxiety.  Endocrine: No heat/cold intolerance.  Allergic/Immunologic: No urticaria.  Resp: No cough, SOB.  Musculoskeletal: No joint swelling.    Physical Examination: BP (!) 187/96 (BP Location: Right Arm, Patient Position: Sitting, Cuff Size: Large)   Pulse 84   Temp 99.1 F (37.3 C) (Oral)   Ht 5\' 3"  (1.6  m)   Wt 223 lb 3.2 oz (101.2 kg)   BMI 39.54 kg/m  Gen: NAD, alert and oriented x 4. Uses walker for assistance. Overweight.  HEENT: PEERLA, EOMI, Neck: supple, no JVD Chest: CTA bilaterally, no wheezes, crackles, or other adventitious sounds CV: RRR, no m/g/c/r Abd: soft, NT, ND, +BS in all four quadrants; no HSM, guarding, ridigity, or rebound tenderness Ext: no edema, well perfused with 2+ pulses, Skin: no rash or lesions noted on observed skin Lymph: no noted LAD  Data Reviewed:  07/10/19-Hgb was 11.7, MCV 86, WBC 16.4, K 3.3, BUN 28, Ca 7.7   07/13/2019-Hgb 9.1, MCV 86, WBC 6.1   Assessment/Plan: Ms. Yaun is a 79 y.o. female admitted to [facility] for heamtemesis and also diagnosed w/ pneumonia.    1. Hematemesis - resolved but does still have globus sensation without true severe dysphagia. On PPI BID which she will continue. She had a mild Hgb drop while in patient and states pcp rechecked after discharge (results requested). Will schedule EGD for evaluation. To notify me if hematemesis returns in interim.   2. IBS - chronic, avoid known food triggers. No new lower GI symptoms. Due for colonoscopy for hx of polyps next year.   Shiron was seen today for follow-up.  Diagnoses and all orders for this visit:  Dysphagia, unspecified type  Hematemesis with nausea    Patient denies CP, SOB, and use of blood thinners. I discussed the risks and benefits of procedure including bleeding, perforation, infection, missed lesions, medication reactions and possible hospitalization or surgery if complications. All questions answered.   Propofol sedation given chronic  co-morbidities  I personally performed the service, non-incident to. (WP)  Laurine Blazer, [facility]

## 2019-08-25 ENCOUNTER — Encounter (INDEPENDENT_AMBULATORY_CARE_PROVIDER_SITE_OTHER): Payer: Self-pay | Admitting: *Deleted

## 2019-08-25 ENCOUNTER — Other Ambulatory Visit (INDEPENDENT_AMBULATORY_CARE_PROVIDER_SITE_OTHER): Payer: Self-pay | Admitting: *Deleted

## 2019-08-26 ENCOUNTER — Other Ambulatory Visit (INDEPENDENT_AMBULATORY_CARE_PROVIDER_SITE_OTHER): Payer: Self-pay | Admitting: *Deleted

## 2019-09-01 ENCOUNTER — Other Ambulatory Visit (HOSPITAL_COMMUNITY)
Admission: RE | Admit: 2019-09-01 | Discharge: 2019-09-01 | Disposition: A | Discharge status: Home / Self Care | Payer: Medicare PPO | Source: Ambulatory Visit | Attending: Internal Medicine | Admitting: Internal Medicine

## 2019-09-01 ENCOUNTER — Encounter (HOSPITAL_COMMUNITY)
Admission: RE | Admit: 2019-09-01 | Discharge: 2019-09-01 | Disposition: A | Discharge status: Home / Self Care | Payer: Medicare PPO | Source: Ambulatory Visit | Attending: Internal Medicine | Admitting: Internal Medicine

## 2019-09-01 ENCOUNTER — Other Ambulatory Visit: Payer: Self-pay

## 2019-09-01 DIAGNOSIS — Z01812 Encounter for preprocedural laboratory examination: Secondary | ICD-10-CM | POA: Diagnosis not present

## 2019-09-01 DIAGNOSIS — Z20822 Contact with and (suspected) exposure to covid-19: Secondary | ICD-10-CM | POA: Diagnosis not present

## 2019-09-01 NOTE — Patient Instructions (Addendum)
7    Your procedure is scheduled on: 09/03/2019  Report to [facility] at     12:30 PM.  Call this number if you have problems the morning of surgery: [phone]   Remember:   Do not drink or eat food:After Midnight.        No Smoking the day of procedure      Take these medicines the morning of surgery with A SIP OF WATER: metoprolol, prilosec, and Zoloft   Do not wear jewelry, make-up or nail polish.  Do not wear lotions, powders, or perfumes. You may wear deodorant.                Do not bring valuables to the hospital.  Contacts, dentures or bridgework may not be worn into surgery.  Leave suitcase in the car. After surgery it may be brought to your room.  For patients admitted to the hospital, checkout time is 11:00 AM the day of discharge.   Patients discharged the day of surgery will not be allowed to drive home. Upper Endoscopy, Adult Upper endoscopy is a procedure to look inside the upper GI (gastrointestinal) tract. The upper GI tract is made up of:  The part of the body that moves food from your mouth to your stomach (esophagus).  The stomach.  The first part of your small intestine (duodenum). This procedure is also called esophagogastroduodenoscopy (EGD) or gastroscopy. In this procedure, your health care provider passes a thin, flexible tube (endoscope) through your mouth and down your esophagus into your stomach. A small camera is attached to the end of the tube. Images from the camera appear on a monitor in the exam room. During this procedure, your health care provider may also remove a small piece of tissue to be sent to a lab and examined under a microscope (biopsy). Your health care provider may do an upper endoscopy to diagnose cancers of the upper GI tract. You may also have this procedure to find the cause of other conditions, such as:  Stomach pain.  Heartburn.  Pain or problems when swallowing.  Nausea and vomiting.  Stomach bleeding.  Stomach  ulcers. Tell a health care provider about:  Any allergies you have.  All medicines you are taking, including vitamins, herbs, eye drops, creams, and over-the-counter medicines.  Any problems you or family members have had with anesthetic medicines.  Any blood disorders you have.  Any surgeries you have had.  Any medical conditions you have.  Whether you are pregnant or may be pregnant. What are the risks? Generally, this is a safe procedure. However, problems may occur, including:  Infection.  Bleeding.  Allergic reactions to medicines.  A tear or hole (perforation) in the esophagus, stomach, or duodenum. What happens before the procedure? Staying hydrated Follow instructions from your health care provider about hydration, which may include:  Up to 2 hours before the procedure - you may continue to drink clear liquids, such as water, clear fruit juice, black coffee, and plain tea.  Eating and drinking restrictions Follow instructions from your health care provider about eating and drinking, which may include:  8 hours before the procedure - stop eating heavy meals or foods, such as meat, fried foods, or fatty foods.  6 hours before the procedure - stop eating light meals or foods, such as toast or cereal.  6 hours before the procedure - stop drinking milk or drinks that contain milk.  2 hours before the procedure - stop drinking clear  liquids. Medicines Ask your health care provider about:  Changing or stopping your regular medicines. This is especially important if you are taking diabetes medicines or blood thinners.  Taking medicines such as aspirin and ibuprofen. These medicines can thin your blood. Do not take these medicines unless your health care provider tells you to take them.  Taking over-the-counter medicines, vitamins, herbs, and supplements. General instructions  Plan to have someone take you home from the hospital or clinic.  If you will be going  home right after the procedure, plan to have someone with you for 24 hours.  Ask your health care provider what steps will be taken to help prevent infection. What happens during the procedure?  1. An IV will be inserted into one of your veins. 2. You may be given one or more of the following: ? A medicine to help you relax (sedative). ? A medicine to numb the throat (local anesthetic). 3. You will lie on your left side on an exam table. 4. Your health care provider will pass the endoscope through your mouth and down your esophagus. 5. Your health care provider will use the scope to check the inside of your esophagus, stomach, and duodenum. Biopsies may be taken. 6. The endoscope will be removed. The procedure may vary among health care providers and hospitals. What happens after the procedure?  Your blood pressure, heart rate, breathing rate, and blood oxygen level will be monitored until you leave the hospital or clinic.  Do not drive for 24 hours if you were given a sedative during your procedure.  When your throat is no longer numb, you may be given some fluids to drink.  It is up to you to get the results of your procedure. Ask your health care provider, or the department that is doing the procedure, when your results will be ready. Summary  Upper endoscopy is a procedure to look inside the upper GI tract.  During the procedure, an IV will be inserted into one of your veins. You may be given a medicine to help you relax.  A medicine will be used to numb your throat.  The endoscope will be passed through your mouth and down your esophagus. This information is not intended to replace advice given to you by your health care provider. Make sure you discuss any questions you have with your health care provider. Document Revised: 08/14/2017 Document Reviewed: 07/22/2017 Elsevier Patient Education  [place] After  Please read the instructions outlined below and refer to this sheet in the next few weeks. These discharge instructions provide you with general information on caring for yourself after you leave the hospital. Your doctor may also give you specific instructions.  While your treatment has been planned according to the most current medical practices available, unavoidable complications occasionally occur. If you have any problems or questions after discharge, please call your doctor. HOME CARE INSTRUCTIONS Activity  You may resume your regular activity but move at a slower pace for the next 24 hours.   Take frequent rest periods for the next 24 hours.   Walking will help expel (get rid of) the air and reduce the bloated feeling in your abdomen.   No driving for 24 hours (because of the anesthesia (medicine) used during the test).   You may shower.   Do not sign any important legal documents or operate any machinery for 24 hours (because of the anesthesia used during the test).  Nutrition  Drink plenty of fluids.   You may resume your normal diet.   Begin with a light meal and progress to your normal diet.   Avoid alcoholic beverages for 24 hours or as instructed by your caregiver.  Medications You may resume your normal medications unless your caregiver tells you otherwise. What you can expect today  You may experience abdominal discomfort such as a feeling of fullness or "gas" pains.   You may experience a sore throat for 2 to 3 days. This is normal. Gargling with salt water may help this.  Follow-up Your doctor will discuss the results of your test with you. SEEK IMMEDIATE MEDICAL CARE IF:  You have excessive nausea (feeling sick to your stomach) and/or vomiting.   You have severe abdominal pain and distention (swelling).   You have trouble swallowing.   You have a temperature over 100  F (37.8 C).   You have rectal bleeding or vomiting of blood.  Document Released: 10/04/2003 Document Revised: 02/08/2011 Document Reviewed: 04/16/2007

## 2019-09-02 LAB — SARS CORONAVIRUS 2 (TAT 6-24 HRS): SARS Coronavirus 2: NEGATIVE

## 2019-09-03 ENCOUNTER — Encounter (HOSPITAL_COMMUNITY)
Admission: RE | Disposition: A | Discharge status: Home / Self Care | Payer: Self-pay | Source: Home / Self Care | Attending: Internal Medicine

## 2019-09-03 ENCOUNTER — Ambulatory Visit (HOSPITAL_COMMUNITY)
Admission: RE | Admit: 2019-09-03 | Discharge: 2019-09-03 | Disposition: A | Discharge status: Home / Self Care | Payer: Medicare PPO | Attending: Internal Medicine | Admitting: Internal Medicine

## 2019-09-03 ENCOUNTER — Other Ambulatory Visit: Payer: Self-pay

## 2019-09-03 ENCOUNTER — Encounter (HOSPITAL_COMMUNITY): Payer: Self-pay | Admitting: Internal Medicine

## 2019-09-03 ENCOUNTER — Ambulatory Visit (HOSPITAL_COMMUNITY): Payer: Medicare PPO | Admitting: Certified Registered"

## 2019-09-03 DIAGNOSIS — R1314 Dysphagia, pharyngoesophageal phase: Secondary | ICD-10-CM

## 2019-09-03 DIAGNOSIS — Z79899 Other long term (current) drug therapy: Secondary | ICD-10-CM | POA: Insufficient documentation

## 2019-09-03 DIAGNOSIS — K2270 Barrett's esophagus without dysplasia: Secondary | ICD-10-CM | POA: Insufficient documentation

## 2019-09-03 DIAGNOSIS — Z96611 Presence of right artificial shoulder joint: Secondary | ICD-10-CM | POA: Diagnosis not present

## 2019-09-03 DIAGNOSIS — E669 Obesity, unspecified: Secondary | ICD-10-CM | POA: Diagnosis not present

## 2019-09-03 DIAGNOSIS — J302 Other seasonal allergic rhinitis: Secondary | ICD-10-CM | POA: Diagnosis not present

## 2019-09-03 DIAGNOSIS — Q396 Congenital diverticulum of esophagus: Secondary | ICD-10-CM

## 2019-09-03 DIAGNOSIS — Z96653 Presence of artificial knee joint, bilateral: Secondary | ICD-10-CM | POA: Insufficient documentation

## 2019-09-03 DIAGNOSIS — D649 Anemia, unspecified: Secondary | ICD-10-CM | POA: Diagnosis not present

## 2019-09-03 DIAGNOSIS — Z96643 Presence of artificial hip joint, bilateral: Secondary | ICD-10-CM | POA: Diagnosis not present

## 2019-09-03 DIAGNOSIS — F419 Anxiety disorder, unspecified: Secondary | ICD-10-CM | POA: Insufficient documentation

## 2019-09-03 DIAGNOSIS — Z6841 Body Mass Index (BMI) 40.0 and over, adult: Secondary | ICD-10-CM | POA: Insufficient documentation

## 2019-09-03 DIAGNOSIS — Z7983 Long term (current) use of bisphosphonates: Secondary | ICD-10-CM | POA: Insufficient documentation

## 2019-09-03 DIAGNOSIS — I10 Essential (primary) hypertension: Secondary | ICD-10-CM | POA: Insufficient documentation

## 2019-09-03 DIAGNOSIS — R1310 Dysphagia, unspecified: Secondary | ICD-10-CM

## 2019-09-03 DIAGNOSIS — K920 Hematemesis: Secondary | ICD-10-CM

## 2019-09-03 DIAGNOSIS — Z9884 Bariatric surgery status: Secondary | ICD-10-CM

## 2019-09-03 DIAGNOSIS — F329 Major depressive disorder, single episode, unspecified: Secondary | ICD-10-CM | POA: Diagnosis not present

## 2019-09-03 DIAGNOSIS — K219 Gastro-esophageal reflux disease without esophagitis: Secondary | ICD-10-CM | POA: Insufficient documentation

## 2019-09-03 HISTORY — PX: ESOPHAGOGASTRODUODENOSCOPY (EGD) WITH PROPOFOL: SHX5813

## 2019-09-03 HISTORY — PX: BIOPSY: SHX5522

## 2019-09-03 LAB — CBC
HCT: 36.6 % (ref 36.0–46.0)
Hemoglobin: 10.9 g/dL — ABNORMAL LOW (ref 12.0–15.0)
MCH: 25.5 pg — ABNORMAL LOW (ref 26.0–34.0)
MCHC: 29.8 g/dL — ABNORMAL LOW (ref 30.0–36.0)
MCV: 85.7 fL (ref 80.0–100.0)
Platelets: 174 10*3/uL (ref 150–400)
RBC: 4.27 MIL/uL (ref 3.87–5.11)
RDW: 14.9 % (ref 11.5–15.5)
WBC: 6.9 10*3/uL (ref 4.0–10.5)
nRBC: 0 % (ref 0.0–0.2)

## 2019-09-03 SURGERY — ESOPHAGOGASTRODUODENOSCOPY (EGD) WITH PROPOFOL
Anesthesia: General

## 2019-09-03 MED ORDER — LIDOCAINE VISCOUS HCL 2 % MT SOLN
OROMUCOSAL | Status: AC
  Filled 2019-09-03: qty 15

## 2019-09-03 MED ORDER — GLYCOPYRROLATE 0.2 MG/ML IJ SOLN
INTRAMUSCULAR | Status: AC
  Filled 2019-09-03: qty 1

## 2019-09-03 MED ORDER — GLYCOPYRROLATE 0.2 MG/ML IJ SOLN
0.2000 mg | Freq: Once | INTRAMUSCULAR | Status: AC
  Administered 2019-09-03: 0.2 mg via INTRAVENOUS

## 2019-09-03 MED ORDER — LACTATED RINGERS IV SOLN: INTRAVENOUS | Status: DC

## 2019-09-03 MED ORDER — PROPOFOL 10 MG/ML IV BOLUS
INTRAVENOUS | Status: DC | PRN
  Administered 2019-09-03: 20 mg via INTRAVENOUS
  Administered 2019-09-03 (×2): 40 mg via INTRAVENOUS

## 2019-09-03 MED ORDER — LIDOCAINE VISCOUS HCL 2 % MT SOLN
15.0000 mL | Freq: Once | OROMUCOSAL | Status: AC
  Administered 2019-09-03: 15 mL via OROMUCOSAL

## 2019-09-03 MED ORDER — PROPOFOL 10 MG/ML IV BOLUS
INTRAVENOUS | Status: AC
  Filled 2019-09-03: qty 40

## 2019-09-03 MED ORDER — PROPOFOL 500 MG/50ML IV EMUL
INTRAVENOUS | Status: DC | PRN
  Administered 2019-09-03: 100 ug/kg/min via INTRAVENOUS
  Administered 2019-09-03: 150 ug/kg/min via INTRAVENOUS

## 2019-09-03 NOTE — Anesthesia Postprocedure Evaluation (Signed)
Anesthesia Post Note  Patient: [name] [name]  Procedure(s) Performed: ESOPHAGOGASTRODUODENOSCOPY (EGD) WITH PROPOFOL (N/A ) BIOPSY  Patient location during evaluation: Phase II Anesthesia Type: General Level of consciousness: awake and alert and patient cooperative Pain management: satisfactory to patient Vital Signs Assessment: post-procedure vital signs reviewed and stable Respiratory status: spontaneous breathing Cardiovascular status: stable Postop Assessment: no apparent nausea or vomiting Anesthetic complications: no   No complications documented.   Last Vitals:  Vitals:   09/03/19 1258 09/03/19 1502  BP: (!) 153/75 129/72  Pulse: 82 91  Resp: 20 16  Temp: 37.4 C 36.8 C  SpO2: 96% 96%    Last Pain:  Vitals:   09/03/19 1502  TempSrc:   PainSc: 0-No pain                 Tyrel Lex

## 2019-09-03 NOTE — Anesthesia Procedure Notes (Signed)
Procedure Name: MAC Date/Time: 09/03/2019 2:36 PM Performed by: Vista Deck, CRNA Pre-anesthesia Checklist: Patient identified, Emergency Drugs available, Suction available, Timeout performed and Patient being monitored Patient Re-evaluated:Patient Re-evaluated prior to induction Oxygen Delivery Method: Non-rebreather mask

## 2019-09-03 NOTE — H&P (Signed)
[name] [name] is an 79 y.o. female.   Chief Complaint: Patient is here for esophagogastroduodenoscopy. HPI: Patient is 79 year old Caucasian female who has history of GERD complicated by short segment Barrett's esophagus who was admitted to [facility] about 8 weeks ago for pneumonia and she experienced upper GI bleed.  She has been on a PPI she did not undergo any evaluation.  Her hemoglobin prior to discharge was 9.1 g. Patient complains of solid food dysphagia.  She points to upper and mid sternal area site of bolus obstruction.  She does not have dentures.  She tried gum or food down.  She says heartburn is well controlled.  She also complains of intermittent epigastric and periumbilical pain.  Patient does not take OTC NSAIDs.  She denies melena or rectal bleeding. Past history is also significant for bariatric surgery in 1980 when she weighed 397 pounds.  She was able to lose close to 160 pounds and able to maintain it.   Past Medical History:  Diagnosis Date  . Anemia   . Anxiety   . Arthritis   . Barrett's esophagus    seen on 2016 egd at [facility]  . Depression   . GERD (gastroesophageal reflux disease)   . H/O hiatal hernia   . Hypertension   . Obesity   . Pneumonia   . PONV (postoperative nausea and vomiting)   . Seasonal allergies   . Sepsis ([place]) 10/02/2013    Past Surgical History:  Procedure Laterality Date  . ABDOMINAL HYSTERECTOMY    . ABDOMINAL SURGERY    . APPENDECTOMY    . BACK SURGERY     lumbar x2 by Dr Arnoldo Morale  . CARPAL TUNNEL RELEASE Right 03/2014   Dr. Lorin Mercy  . CATARACT EXTRACTION W/PHACO Left 08/06/2013   Procedure: CATARACT EXTRACTION PHACO AND INTRAOCULAR LENS PLACEMENT LEFT EYE;  Surgeon: Tonny Branch, MD;  Location: [facility];  Service: Ophthalmology;  Laterality: Left;  CDE: 9.55  . CATARACT EXTRACTION W/PHACO Right 08/24/2013   Procedure: CATARACT EXTRACTION PHACO AND INTRAOCULAR LENS PLACEMENT (IOC);  Surgeon: Tonny Branch, MD;  Location: [facility];  Service:  Ophthalmology;  Laterality: Right;  CDE:8.30  . CERVICAL FUSION    . CHOLECYSTECTOMY    . COLONOSCOPY N/A 11/03/2013   SLF: 1. Normal mucosa in the terminal ileum. 2. 13 colon polyps removed. 3. Moderate diverticulosis in the descending colon and sigmoid colon 4. the left colon is redundant 5. Small internal  hemorrhoids.  . COLONOSCOPY N/A 09/25/2017   Procedure: COLONOSCOPY;  Surgeon: Rogene Houston, MD;  Location: [facility];  Service: Endoscopy;  Laterality: N/A;  1:55  . ESOPHAGOGASTRODUODENOSCOPY N/A 11/03/2013   SLF: 1. 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 Mild Non-erosive gastritis  . 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 s/p gastric bypass  . EYE SURGERY    . GASTRIC BYPASS  1979   revision in 1980   . HIATAL HERNIA REPAIR     [facility] in 2011  . JOINT REPLACEMENT Right 1995   hip  . JOINT REPLACEMENT Left 2008   hip  . MEDIAL PARTIAL KNEE REPLACEMENT Left    Dr Ronnie Derby  . PARAESOPHAGEAL HERNIA REPAIR  SEP 2010 DR. MCNATT  . POLYPECTOMY  09/25/2017   Procedure: POLYPECTOMY;  Surgeon: Rogene Houston, MD;  Location: [facility];  Service: Endoscopy;;  colon  .  SHOULDER ARTHROSCOPY Right   . SHOULDER ARTHROSCOPY Right    x2  . SKIN BIOPSY    . TONSILLECTOMY    . TOTAL KNEE ARTHROPLASTY Right 09/08/2018   Procedure: RIGHT TOTAL KNEE ARTHROPLASTY;  Surgeon: Marybelle Killings, MD;  Location: [place];  Service: Orthopedics;  Laterality: Right;  . TOTAL SHOULDER ARTHROPLASTY Right 02/16/2013   Procedure: TOTAL SHOULDER ARTHROPLASTY;  Surgeon: Marybelle Killings, MD;  Location: [place];  Service: Orthopedics;  Laterality: Right;  Right Total Shoulder Arthroplasty, Cemented    Family History  Problem Relation Age of Onset  . Colon cancer Brother        IN HIS 60s-METASTATIC  . Colon polyps Paternal  Grandfather   . Breast cancer Mother   . Hypertension Father   . Heart disease Father   . Hypertension Brother   . Heart disease Brother    Social History:  reports that she has never smoked. She has never used smokeless tobacco. She reports that she does not drink alcohol and does not use drugs.  Allergies:  Allergies  Allergen Reactions  . Novocain [Procaine Hcl] Other (See Comments)    Unknown-passed out with novocaine injected for dental surgery.  . Other Hives and Other (See Comments)    EKG pads - need to use pediatric pads  . Latex Rash  . Penicillins Rash and Other (See Comments)    Has patient had a PCN reaction causing immediate rash, facial/tongue/throat swelling, SOB or lightheadedness with hypotension: Yes Has patient had a PCN reaction causing severe rash involving mucus membranes or skin necrosis: No Has patient had a PCN reaction that required hospitalization No Has patient had a PCN reaction occurring within the last 10 years: No  If all of the above answers are "NO", then may proceed with Cephalosporin use.   . Sulfa Antibiotics Rash    Medications Prior to Admission  Medication Sig Dispense Refill  . acetaminophen (TYLENOL) 500 MG tablet Take 1,000 mg by mouth every 6 (six) hours as needed for moderate pain or headache.    . alendronate (FOSAMAX) 70 MG tablet Take 70 mg by mouth every Tuesday.     . AZO-CRANBERRY PO Take 2 tablets by mouth daily as needed (kidney infections).     . Biotin w/ Vitamins C & E (HAIR/SKIN/NAILS PO) Take 3 tablets by mouth daily.     . Calcium-Phosphorus-Vitamin D (CITRACAL +D3 PO) Take 1 tablet by mouth daily.    . Cholecalciferol (VITAMIN D3) 50 MCG (2000 UT) capsule Take 2,000 Units by mouth daily.     Marland Kitchen docusate sodium (COLACE) 100 MG capsule Take 100 mg by mouth at bedtime as needed for mild constipation.     . fluticasone (FLONASE) 50 MCG/ACT nasal spray Place 2 sprays into both nostrils daily as needed for allergies.     Marland Kitchen  HYDROcodone-acetaminophen (NORCO/VICODIN) 5-325 MG tablet Take 1 tablet by mouth every 6 (six) hours as needed for moderate pain. (Patient taking differently: Take 0.5-1 tablets by mouth every 8 (eight) hours as needed for moderate pain. ) 30 tablet 0  . Lactobacillus (FLORAJEN ACIDOPHILUS) CAPS Take 1 capsule by mouth daily before breakfast.    . MUCINEX 600 MG 12 hr tablet Take 600 mg by mouth 2 (two) times daily as needed for cough.     Marland Kitchen omeprazole (PRILOSEC) 20 MG capsule Take 20 mg by mouth 2 (two) times daily before a meal.    . ondansetron (ZOFRAN) 4 MG tablet Take 4  mg by mouth every 8 (eight) hours as needed for nausea or vomiting.    . sertraline (ZOLOFT) 100 MG tablet Take 100 mg by mouth daily.    . traZODone (DESYREL) 50 MG tablet Take 50 mg by mouth at bedtime.    Marland Kitchen albuterol (PROVENTIL) (2.5 MG/3ML) 0.083% nebulizer solution Take 3 mLs (2.5 mg total) by nebulization every 4 (four) hours as needed for wheezing. (Patient taking differently: Take 2.5 mg by nebulization every 6 (six) hours as needed for wheezing. ) 75 mL 12  . lisinopril (ZESTRIL) 5 MG tablet Take 1 tablet (5 mg total) by mouth daily. (Patient not taking: Reported on 08/20/2019) 30 tablet 0  . metoprolol succinate (TOPROL-XL) 25 MG 24 hr tablet Take 1 tablet (25 mg total) by mouth daily. (Patient not taking: Reported on 08/20/2019)    . nystatin ointment (MYCOSTATIN) Apply 1 application topically 2 (two) times daily as needed (for irritation).      Results for orders placed or performed during the hospital encounter of 09/03/19 (from the past 48 hour(s))  CBC     Status: Abnormal   Collection Time: 09/03/19  1:10 PM  Result Value Ref Range   WBC 6.9 4.0 - 10.5 K/uL   RBC 4.27 3.87 - 5.11 MIL/uL   Hemoglobin 10.9 (L) 12.0 - 15.0 g/dL   HCT 36.6 36 - 46 %   MCV 85.7 80.0 - 100.0 fL   MCH 25.5 (L) 26.0 - 34.0 pg   MCHC 29.8 (L) 30.0 - 36.0 g/dL   RDW 14.9 11.5 - 15.5 %   Platelets 174 150 - 400 K/uL   nRBC 0.0 0.0 -  0.2 %    Comment: Performed at [facility], [address] [place], [place] [address]   No results found.  Review of Systems  Blood pressure (!) 153/75, pulse 82, temperature 99.3 F (37.4 C), temperature source Oral, resp. rate 20, height 5\' 3"  (1.6 m), weight 102.5 kg, SpO2 96 %. Physical Exam HENT:     Mouth/Throat:     Mouth: Mucous membranes are moist.     Pharynx: Oropharynx is clear.     Comments: Patient is edentulous. Eyes:     General: No scleral icterus.    Conjunctiva/sclera: Conjunctivae normal.  Cardiovascular:     Rate and Rhythm: Normal rate and regular rhythm.     Heart sounds: Normal heart sounds. No murmur heard.   Pulmonary:     Effort: Pulmonary effort is normal.     Breath sounds: Normal breath sounds.  Abdominal:     Comments: Abdomen is full.  She has a long scar across upper abdomen.  On palpation abdomen is soft.  She has mild epigastric and periumbilical tenderness.  No organomegaly or masses.  Musculoskeletal:        General: No swelling.     Cervical back: Neck supple.  Lymphadenopathy:     Cervical: No cervical adenopathy.  Skin:    General: Skin is warm and dry.  Neurological:     Mental Status: She is alert.  Psychiatric:        Mood and Affect: Mood normal.   H&H from this afternoon is 10.9 and 36.6. WBC 6.9 and platelet count 170 4K.  Assessment/Plan History of upper GI bleed. Esophageal dysphagia. Esophagogastroduodenoscopy with possible esophageal dilation.  Hildred Laser, MD 09/03/2019, 2:32 PM

## 2019-09-03 NOTE — Anesthesia Preprocedure Evaluation (Signed)
Anesthesia Evaluation  Patient identified by MRN, date of birth, ID band Patient awake    Reviewed: Allergy & Precautions, H&P , NPO status , Patient's Chart, lab work & pertinent test results, reviewed documented beta blocker date and time   History of Anesthesia Complications (+) PONV and history of anesthetic complications  Airway Mallampati: II  TM Distance: >3 FB Neck ROM: full    Dental no notable dental hx.    Pulmonary pneumonia, resolved,    Pulmonary exam normal breath sounds clear to auscultation       Cardiovascular Exercise Tolerance: Good hypertension, negative cardio ROS   Rhythm:regular Rate:Normal     Neuro/Psych PSYCHIATRIC DISORDERS Anxiety Depression negative neurological ROS     GI/Hepatic Neg liver ROS, hiatal hernia, GERD  Medicated,  Endo/Other  negative endocrine ROS  Renal/GU negative Renal ROS  negative genitourinary   Musculoskeletal   Abdominal   Peds  Hematology  (+) Blood dyscrasia, anemia ,   Anesthesia Other Findings   Reproductive/Obstetrics negative OB ROS                             Anesthesia Physical Anesthesia Plan  ASA: III  Anesthesia Plan: General   Post-op Pain Management:    Induction:   PONV Risk Score and Plan: 4 or greater  Airway Management Planned:   Additional Equipment:   Intra-op Plan:   Post-operative Plan:   Informed Consent: I have reviewed the patients History and Physical, chart, labs and discussed the procedure including the risks, benefits and alternatives for the proposed anesthesia with the patient or authorized representative who has indicated his/her understanding and acceptance.     Dental Advisory Given  Plan Discussed with: CRNA  Anesthesia Plan Comments:         Anesthesia Quick Evaluation

## 2019-09-03 NOTE — Discharge Instructions (Signed)
No aspirin or NSAIDs for 24 hours. Resume usual medications as before. Please make sure that you drink 8 ounces of water when you take Fosamax/alendronate. Resume usual diet. No driving for 24 hours. Physician will call with biopsy results.

## 2019-09-03 NOTE — Transfer of Care (Signed)
Immediate Anesthesia Transfer of Care Note  Patient: [name] [name]  Procedure(s) Performed: ESOPHAGOGASTRODUODENOSCOPY (EGD) WITH PROPOFOL (N/A ) BIOPSY  Patient Location: PACU  Anesthesia Type:General  Level of Consciousness: awake, alert  and patient cooperative  Airway & Oxygen Therapy: Patient Spontanous Breathing  Post-op Assessment: Report given to RN and Post -op Vital signs reviewed and stable  Post vital signs: Reviewed and stable  Last Vitals:  Vitals Value Taken Time  BP    Temp    Pulse    Resp    SpO2    SEE PACU FLOW SHEET FOR VITAL SIGNS  Last Pain:  Vitals:   09/03/19 1258  TempSrc: Oral  PainSc: 0-No pain      Patients Stated Pain Goal: 6 (44/71/58 0638)  Complications: No complications documented.

## 2019-09-03 NOTE — Op Note (Signed)
[facility] Patient Name: [name] [name] Procedure Date: 09/03/2019 2:19 PM MRN: [id number] Date of Birth: Nov 02, 1940 Attending MD: Hildred Laser , MD CSN: [id number] Age: 79 Admit Type: Outpatient Procedure:                Upper GI endoscopy Indications:              Esophageal dysphagia, Hematemesis Providers:                Hildred Laser, MD, [place] B. Sharon Seller, RN, [place]                            Page Referring MD:             Curlene Labrum, MD Medicines:                Propofol per Anesthesia Complications:            No immediate complications. Estimated Blood Loss:     Estimated blood loss was minimal. Procedure:                Pre-Anesthesia Assessment:                           - Prior to the procedure, a History and Physical                            was performed, and patient medications and                            allergies were reviewed. The patient's tolerance of                            previous anesthesia was also reviewed. The risks                            and benefits of the procedure and the sedation                            options and risks were discussed with the patient.                            All questions were answered, and informed consent                            was obtained. Prior Anticoagulants: The patient has                            taken no previous anticoagulant or antiplatelet                            agents except for aspirin. ASA Grade Assessment:                            III - A patient with severe systemic disease. After  reviewing the risks and benefits, the patient was                            deemed in satisfactory condition to undergo the                            procedure.                           After obtaining informed consent, the endoscope was                            passed under direct vision. Throughout the                            procedure, the patient's blood  pressure, pulse, and                            oxygen saturations were monitored continuously. The                            [id number]-H190 ([id number]) scope was introduced through the                            mouth, and advanced to the proximal jejunum. The                            upper GI endoscopy was accomplished without                            difficulty. The patient tolerated the procedure                            well. Scope In: 2:45:45 PM Scope Out: 2:57:26 PM Total Procedure Duration: 0 hours 11 minutes 41 seconds  Findings:      The hypopharynx was normal.      The proximal esophagus and mid esophagus were normal.      A non-bleeding diverticulum with a small opening and no stigmata of       recent bleeding was found in the distal esophagus.      There were esophageal mucosal changes secondary to established       short-segment Barrett's disease present in the distal esophagus. Mucosa       was biopsied with a cold forceps for histology. One specimen bottle was       sent to pathology.      The Z-line was regular and was found 34 cm from the incisors.      Evidence of a gastric bypass was found. A gastric pouch with a small       size was found. The staple line appeared intact. The gastrojejunal       anastomosis was characterized by healthy appearing mucosa. This was       traversed.      The gastric body was normal.      The examined jejunum was normal. Impression:               -  Normal hypopharynx.                           - Normal proximal esophagus and mid esophagus.                           - Diverticulum in the distal esophagus.                           - Esophageal mucosal changes secondary to                            established short-segment Barrett's disease.                            Biopsied.                           - Z-line regular, 34 cm from the incisors.                           - Gastric bypass with a small-sized pouch and                             intact staple line. Gastrojejunal anastomosis                            characterized by healthy appearing mucosa.                           - Normal gastric body.                           - Normal examined jejunum.                           comment: no stricture present; therefore esophagus                            not dilated. Moderate Sedation:      Per Anesthesia Care Recommendation:           - Patient has a contact number available for                            emergencies. The signs and symptoms of potential                            delayed complications were discussed with the                            patient. Return to normal activities tomorrow.                            Written discharge instructions were provided to the  patient.                           - Resume previous diet today.                           - Continue present medications.                           - No aspirin, ibuprofen, naproxen, or other                            non-steroidal anti-inflammatory drugs for 1 day.                           - Await pathology results. Procedure Code(s):        --- Professional ---                           [phone], Esophagogastroduodenoscopy, flexible,                            transoral; with biopsy, single or multiple Diagnosis Code(s):        --- Professional ---                           K22.70, Barrett's esophagus without dysplasia                           Q39.6, Congenital diverticulum of esophagus                           Z98.84, Bariatric surgery status                           R13.14, Dysphagia, pharyngoesophageal phase                           K92.0, Hematemesis CPT copyright 2019 [employer]. All rights reserved. The codes documented in this report are preliminary and upon coder review may  be revised to meet current compliance requirements. Hildred Laser, MD Hildred Laser, MD 09/03/2019 3:11:18  PM This report has been signed electronically. Number of Addenda: 0

## 2019-09-08 LAB — SURGICAL PATHOLOGY

## 2019-09-11 ENCOUNTER — Encounter (HOSPITAL_COMMUNITY): Payer: Self-pay | Admitting: Internal Medicine

## 2019-10-08 ENCOUNTER — Other Ambulatory Visit: Payer: Self-pay

## 2019-10-08 ENCOUNTER — Encounter (INDEPENDENT_AMBULATORY_CARE_PROVIDER_SITE_OTHER): Payer: Self-pay | Admitting: Gastroenterology

## 2019-10-08 ENCOUNTER — Ambulatory Visit (INDEPENDENT_AMBULATORY_CARE_PROVIDER_SITE_OTHER): Payer: Medicare PPO | Admitting: Gastroenterology

## 2019-10-08 VITALS — BP 163/83 | HR 79 | Temp 99.7°F | Ht 63.0 in | Wt 215.2 lb

## 2019-10-08 DIAGNOSIS — D649 Anemia, unspecified: Secondary | ICD-10-CM

## 2019-10-08 NOTE — Progress Notes (Signed)
Patient profile: [name] [name] is a 79 y.o. female seen for follow-up-she was last seen July 2021 for an endoscopy.  History of Present Illness: [name] [name] is seen today for follow up. She continues to have a globus sensation w/ swallowing. She is not feeling any GERD symptoms with her PPI BID. Very rare nausea, uses zofran PRN after foods such as lasagna to prevent nausea, taking a few times a week. Feels appetite is good. She avoids broccoli, cauliflower, etc as these give gas. Dairy may cause looser stools.   She denies any episodes of hematemesis since her last OV.   She is having a BM about daily, occasionally 2-4 BM in a day. Has bloating if misses a day without a BM. Denies any blood in stool/dark stools. Overall feels bowels are doing well.   She has lost weight since last OV as below -discussed today, she reports a lot of pain in left leg from pinched nerve. She lives alone & has been cooking less w/ leg pain. Some days doesn't get up until around 11am and eats 2 meals a day instead of 3.   No longer using O2 at home   Wt Readings from Last 3 Encounters:  10/08/19 215 lb 3.2 oz (97.6 kg)  09/03/19 226 lb (102.5 kg)  08/20/19 223 lb 3.2 oz (101.2 kg)     Last Colonoscopy: 2019-- Seven small polyps in the sigmoid colon, at the splenic flexure, in the transverse colon and in the ascending colon, removed with a cold snare. Resected and retrieved. - Two small polyps in the sigmoid colon and in the ascending colon. Biopsied. - One 12 mm polyp in the distal sigmoid colon, removed with a hot snare. Resected and retrieved. - Diverticulosis in the sigmoid colon. - Internal hemorrhoids.  PATH-Patient had 10 polyps removed. Largest polyp was a distal sigmoid colon and estimated to be 12 mm. It is tubular adenoma. Most of the other polyps are tubular adenomas as well. Next colonoscopy in 3 years but we will see patient in the office first   Last Endoscopy: Endoscopy - 09/2019--  Normal hypopharynx. - Normal proximal esophagus and mid esophagus. - Diverticulum in the distal esophagus. - Esophageal mucosal changes secondary to established short-segment Barrett's disease. Biopsied. - Z-line regular, 34 cm from the incisors. - Gastric bypass with a small-sized pouch and intact staple line. Gastrojejunal anastomosis characterized by healthy appearing mucosa. - Normal gastric body. - Normal examined jejunum. Biopsy results reviewed with patient. Biopsy confirms Barrett's esophagus without dysplasia. She has short segment. Patient advised to report to emergency room if she has gross rectal bleeding or melena. She will have H&H in 4 weeks prior to office visit with me. Report to PCP.  Past Medical History:  Past Medical History:  Diagnosis Date  . Anemia   . Anxiety   . Arthritis   . Barrett's esophagus    seen on 2016 egd at [facility]  . Depression   . GERD (gastroesophageal reflux disease)   . H/O hiatal hernia   . Hypertension   . Obesity   . Pneumonia   . PONV (postoperative nausea and vomiting)   . Seasonal allergies   . Sepsis ([place]) 10/02/2013    Problem List: Patient Active Problem List   Diagnosis Date Noted  . Impingement syndrome of left shoulder 02/06/2019  . S/P total knee arthroplasty, 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  . Primary osteoarthritis, 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  . History of arthroplasty of right shoulder 08/14/2018  .  History of colonic polyps 08/14/2017  . Choledocholithiasis   . Abnormal CT scan, esophagus   . Cholestasis 02/05/2017  . Depression 02/05/2017  . Hypomagnesemia 02/05/2017  . Bradycardia 02/05/2017  . Transaminitis 07/08/2016  . Constipation 07/08/2016  . GIB (gastrointestinal bleeding) 07/08/2016  . HCAP (healthcare-associated pneumonia) 09/23/2015  . AKI (acute kidney injury) ([place]) 07/21/2015  . Hypokalemia 07/21/2015  . CAP (community acquired pneumonia) 07/14/2015  . Upper abdominal pain 06/10/2014  . Folliculitis of  perineum 02/24/2014  . Giant comedone 12/21/2013  . 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 Acrochordon 11/25/2013  . Dysphagia 10/29/2013  . Class 2 severe obesity due to excess calories with serious comorbidity and body mass index (BMI) of 38.0 to 38.9 in adult ([place]) 10/02/2013  . Primary hypertension   . GERD (gastroesophageal reflux disease)   . Anxiety   . Anemia of chronic disease     Past Surgical History: Past Surgical History:  Procedure Laterality Date  . ABDOMINAL HYSTERECTOMY    . ABDOMINAL SURGERY    . APPENDECTOMY    . BACK SURGERY     lumbar x2 by Dr Arnoldo Morale  . BIOPSY  09/03/2019   Procedure: BIOPSY;  Surgeon: Rogene Houston, MD;  [facility];  Service: Endoscopy;;  esophagus  . CARPAL TUNNEL RELEASE Right 03/2014   Dr. Lorin Mercy  . CATARACT EXTRACTION W/PHACO Left 08/06/2013   Procedure: CATARACT EXTRACTION PHACO AND INTRAOCULAR LENS PLACEMENT LEFT EYE;  Surgeon: Tonny Branch, MD;  Location: [facility];  Service: Ophthalmology;  Laterality: Left;  CDE: 9.55  . CATARACT EXTRACTION W/PHACO Right 08/24/2013   Procedure: CATARACT EXTRACTION PHACO AND INTRAOCULAR LENS PLACEMENT (IOC);  Surgeon: Tonny Branch, MD;  Location: [facility];  Service: Ophthalmology;  Laterality: Right;  CDE:8.30  . CERVICAL FUSION    . CHOLECYSTECTOMY    . COLONOSCOPY N/A 11/03/2013   SLF: 1. Normal mucosa in the terminal ileum. 2. 13 colon polyps removed. 3. Moderate diverticulosis in the descending colon and sigmoid colon 4. the left colon is redundant 5. Small internal  hemorrhoids.  . COLONOSCOPY N/A 09/25/2017   Procedure: COLONOSCOPY;  Surgeon: Rogene Houston, MD;  [facility];  Service: Endoscopy;  Laterality: N/A;  1:55  . ESOPHAGOGASTRODUODENOSCOPY N/A 11/03/2013   SLF: 1. 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 Mild Non-erosive gastritis  . ESOPHAGOGASTRODUODENOSCOPY  10/2014   [facility]: IMPRESSIONS: - likely small  distal esophageal diverticulum without evidence of fistula, +barrett's esophagus without dysplasia. s/p gastric bypass  . ESOPHAGOGASTRODUODENOSCOPY (EGD) WITH PROPOFOL N/A 09/03/2019   Procedure: ESOPHAGOGASTRODUODENOSCOPY (EGD) WITH PROPOFOL;  Surgeon: Rogene Houston, MD;  [facility];  Service: Endoscopy;  Laterality: N/A;  205  . EYE SURGERY    . GASTRIC BYPASS  1979   revision in 1980   . HIATAL HERNIA REPAIR     [facility] in 2011  . JOINT REPLACEMENT Right 1995   hip  . JOINT REPLACEMENT Left 2008   hip  . MEDIAL PARTIAL KNEE REPLACEMENT Left    Dr Ronnie Derby  . PARAESOPHAGEAL HERNIA REPAIR  SEP 2010 DR. MCNATT  . POLYPECTOMY  09/25/2017   Procedure: POLYPECTOMY;  Surgeon: Rogene Houston, MD;  [facility];  Service: Endoscopy;;  colon  . SHOULDER ARTHROSCOPY Right   . SHOULDER ARTHROSCOPY Right    x2  . SKIN BIOPSY    . TONSILLECTOMY    . TOTAL KNEE ARTHROPLASTY  Right 09/08/2018   Procedure: RIGHT TOTAL KNEE ARTHROPLASTY;  Surgeon: Marybelle Killings, MD;  Location: [place];  Service: Orthopedics;  Laterality: Right;  . TOTAL SHOULDER ARTHROPLASTY Right 02/16/2013   Procedure: TOTAL SHOULDER ARTHROPLASTY;  Surgeon: Marybelle Killings, MD;  Location: [place];  Service: Orthopedics;  Laterality: Right;  Right Total Shoulder Arthroplasty, Cemented    Allergies: Allergies  Allergen Reactions  . Novocain [Procaine Hcl] Other (See Comments)    Unknown-passed out with novocaine injected for dental surgery.  . Other Hives and Other (See Comments)    EKG pads - need to use pediatric pads  . Latex Rash  . Penicillins Rash and Other (See Comments)    Has patient had a PCN reaction causing immediate rash, facial/tongue/throat swelling, SOB or lightheadedness with hypotension: Yes Has patient had a PCN reaction causing severe rash involving mucus membranes or skin necrosis: No Has patient had a PCN reaction that required hospitalization No Has patient had a PCN reaction occurring  within the last 10 years: No  If all of the above answers are "NO", then may proceed with Cephalosporin use.   . Sulfa Antibiotics Rash      Home Medications:  Current Outpatient Medications:  .  acetaminophen (TYLENOL) 500 MG tablet, Take 1,000 mg by mouth every 6 (six) hours as needed for moderate pain or headache., Disp: , Rfl:  .  albuterol (PROVENTIL) (2.5 MG/3ML) 0.083% nebulizer solution, Take 3 mLs (2.5 mg total) by nebulization every 4 (four) hours as needed for wheezing. (Patient taking differently: Take 2.5 mg by nebulization every 6 (six) hours as needed for wheezing. ), Disp: 75 mL, Rfl: 12 .  alendronate (FOSAMAX) 70 MG tablet, Take 70 mg by mouth every Tuesday. , Disp: , Rfl:  .  AZO-CRANBERRY PO, Take 2 tablets by mouth daily as needed (kidney infections). , Disp: , Rfl:  .  Biotin w/ Vitamins C & E (HAIR/SKIN/NAILS PO), Take 3 tablets by mouth daily. , Disp: , Rfl:  .  Calcium-Phosphorus-Vitamin D (CITRACAL +D3 PO), Take 1 tablet by mouth daily., Disp: , Rfl:  .  Cholecalciferol (VITAMIN D3) 50 MCG (2000 UT) capsule, Take 2,000 Units by mouth daily. , Disp: , Rfl:  .  docusate sodium (COLACE) 100 MG capsule, Take 100 mg by mouth at bedtime as needed for mild constipation. , Disp: , Rfl:  .  fluticasone (FLONASE) 50 MCG/ACT nasal spray, Place 2 sprays into both nostrils daily as needed for allergies. , Disp: , Rfl:  .  Lactobacillus (FLORAJEN ACIDOPHILUS) CAPS, Take 1 capsule by mouth daily before breakfast., Disp: , Rfl:  .  MUCINEX 600 MG 12 hr tablet, Take 600 mg by mouth 2 (two) times daily as needed for cough. , Disp: , Rfl:  .  nystatin ointment (MYCOSTATIN), Apply 1 application topically 2 (two) times daily as needed (for irritation)., Disp: , Rfl:  .  omeprazole (PRILOSEC) 20 MG capsule, Take 20 mg by mouth 2 (two) times daily before a meal., Disp: , Rfl:  .  ondansetron (ZOFRAN) 4 MG tablet, Take 4 mg by mouth every 8 (eight) hours as needed for nausea or vomiting.,  Disp: , Rfl:  .  sertraline (ZOLOFT) 100 MG tablet, Take 100 mg by mouth daily., Disp: , Rfl:  .  traZODone (DESYREL) 50 MG tablet, Take 50 mg by mouth at bedtime., Disp: , Rfl:    Family History: family history includes Breast cancer in her mother; Colon cancer in her brother; Colon polyps  in her paternal grandfather; Heart disease in her brother and father; Hypertension in her brother and father.    Social History:   reports that she has never smoked. She has never used smokeless tobacco. She reports that she does not drink alcohol and does not use drugs.   Review of Systems: Constitutional: Denies weight loss/weight gain  Eyes: No changes in vision. ENT: No oral lesions, sore throat.  GI: see HPI.  Heme/Lymph: No easy bruising.  CV: No chest pain.  GU: No hematuria.  Integumentary: No rashes.  Neuro: No headaches.  Psych: No depression/anxiety.  Endocrine: No heat/cold intolerance.  Allergic/Immunologic: No urticaria.  Resp: No cough, SOB.  Musculoskeletal: No joint swelling.    Physical Examination: BP (!) 163/83 (BP Location: Right Arm, Patient Position: Sitting, Cuff Size: Large)   Pulse 79   Temp 99.7 F (37.6 C) (Oral)   Ht 5\' 3"  (1.6 m)   Wt 215 lb 3.2 oz (97.6 kg)   BMI 38.12 kg/m  Gen: NAD, alert and oriented x 4, uses walker for assistance HEENT: PEERLA, EOMI, Neck: supple, no JVD Chest: CTA bilaterally, no wheezes, crackles, or other adventitious sounds CV: RRR, no m/g/c/r Abd: soft, NT, ND, +BS in all four quadrants; no HSM, guarding, ridigity, or rebound tenderness Ext: no edema, well perfused with 2+ pulses, Skin: no rash or lesions noted on observed skin Lymph: no noted LAD  Data:  09/03/19-Hgb 10.9, MCV 86   Assessment/Plan: Ms. Hubert is a 79 y.o. female  Kemyah was seen today for follow-up.  Diagnoses and all orders for this visit:  Anemia, unspecified type -     CBC with Differential -     Fe+TIBC+Fer      1. Hematemesis/normocytic  anemia-admitted June 2021 at [facility] for hematemesis without return of symptoms. She had a follow-up endoscopy with Dr. Laural Golden in July 2021.  We will repeat her hemoglobin today as well as iron studies.  She symptomatically only has mild globus and is on PPI twice daily.  She does not take NSAIDs.  She generally feels well today without specific GI complaints.   2. Weight loss - seems more related to leg pain than any GI issues. Discussed alarm symptoms to notify me with. If continues consider imaging, thyroid labs, etc.   F/up pending lab results.    I personally performed the service, non-incident to. (WP)  Laurine Blazer, [facility]

## 2019-10-08 NOTE — Patient Instructions (Signed)
We are checking labs and will call w/ results  

## 2019-10-09 ENCOUNTER — Other Ambulatory Visit (INDEPENDENT_AMBULATORY_CARE_PROVIDER_SITE_OTHER): Payer: Self-pay | Admitting: Gastroenterology

## 2019-10-09 LAB — CBC WITH DIFFERENTIAL/PLATELET
Absolute Monocytes: 675 cells/uL (ref 200–950)
Basophils Absolute: 30 cells/uL (ref 0–200)
Basophils Relative: 0.6 %
Eosinophils Absolute: 160 cells/uL (ref 15–500)
Eosinophils Relative: 3.2 %
HCT: 37.3 % (ref 35.0–45.0)
Hemoglobin: 11.8 g/dL (ref 11.7–15.5)
Lymphs Abs: 835 cells/uL — ABNORMAL LOW (ref 850–3900)
MCH: 25.7 pg — ABNORMAL LOW (ref 27.0–33.0)
MCHC: 31.6 g/dL — ABNORMAL LOW (ref 32.0–36.0)
MCV: 81.3 fL (ref 80.0–100.0)
MPV: 12 fL (ref 7.5–12.5)
Monocytes Relative: 13.5 %
Neutro Abs: 3300 cells/uL (ref 1500–7800)
Neutrophils Relative %: 66 %
Platelets: 182 10*3/uL (ref 140–400)
RBC: 4.59 10*6/uL (ref 3.80–5.10)
RDW: 14.5 % (ref 11.0–15.0)
Total Lymphocyte: 16.7 %
WBC: 5 10*3/uL (ref 3.8–10.8)

## 2019-10-09 LAB — IRON,TIBC AND FERRITIN PANEL
%SAT: 8 % (calc) — ABNORMAL LOW (ref 16–45)
Ferritin: 12 ng/mL — ABNORMAL LOW (ref 16–288)
Iron: 35 ug/dL — ABNORMAL LOW (ref 45–160)
TIBC: 437 mcg/dL (calc) (ref 250–450)

## 2019-10-09 MED ORDER — FERROUS SULFATE 325 (65 FE) MG PO TABS: 325.0000 mg | ORAL_TABLET | Freq: Every day | ORAL | 3 refills | Status: DC

## 2019-10-15 DIAGNOSIS — R5382 Chronic fatigue, unspecified: Secondary | ICD-10-CM | POA: Diagnosis not present

## 2019-10-15 DIAGNOSIS — I10 Essential (primary) hypertension: Secondary | ICD-10-CM | POA: Diagnosis not present

## 2019-10-15 DIAGNOSIS — R011 Cardiac murmur, unspecified: Secondary | ICD-10-CM | POA: Diagnosis not present

## 2019-10-15 DIAGNOSIS — R739 Hyperglycemia, unspecified: Secondary | ICD-10-CM | POA: Diagnosis not present

## 2019-10-15 DIAGNOSIS — K219 Gastro-esophageal reflux disease without esophagitis: Secondary | ICD-10-CM | POA: Diagnosis not present

## 2019-10-15 DIAGNOSIS — N182 Chronic kidney disease, stage 2 (mild): Secondary | ICD-10-CM | POA: Diagnosis not present

## 2019-10-22 DIAGNOSIS — F339 Major depressive disorder, recurrent, unspecified: Secondary | ICD-10-CM | POA: Diagnosis not present

## 2019-10-22 DIAGNOSIS — M5416 Radiculopathy, lumbar region: Secondary | ICD-10-CM | POA: Diagnosis not present

## 2019-10-22 DIAGNOSIS — G4700 Insomnia, unspecified: Secondary | ICD-10-CM | POA: Diagnosis not present

## 2019-10-22 DIAGNOSIS — R300 Dysuria: Secondary | ICD-10-CM | POA: Diagnosis not present

## 2019-10-22 DIAGNOSIS — I5032 Chronic diastolic (congestive) heart failure: Secondary | ICD-10-CM | POA: Diagnosis not present

## 2019-10-22 DIAGNOSIS — N182 Chronic kidney disease, stage 2 (mild): Secondary | ICD-10-CM | POA: Diagnosis not present

## 2019-10-22 DIAGNOSIS — I10 Essential (primary) hypertension: Secondary | ICD-10-CM | POA: Diagnosis not present

## 2019-10-22 DIAGNOSIS — Z6838 Body mass index (BMI) 38.0-38.9, adult: Secondary | ICD-10-CM | POA: Diagnosis not present

## 2019-10-22 DIAGNOSIS — E876 Hypokalemia: Secondary | ICD-10-CM | POA: Diagnosis not present

## 2019-11-18 DIAGNOSIS — D1801 Hemangioma of skin and subcutaneous tissue: Secondary | ICD-10-CM | POA: Diagnosis not present

## 2019-11-18 DIAGNOSIS — L570 Actinic keratosis: Secondary | ICD-10-CM | POA: Diagnosis not present

## 2019-11-18 DIAGNOSIS — L814 Other melanin hyperpigmentation: Secondary | ICD-10-CM | POA: Diagnosis not present

## 2019-11-18 DIAGNOSIS — L821 Other seborrheic keratosis: Secondary | ICD-10-CM | POA: Diagnosis not present

## 2020-01-11 ENCOUNTER — Encounter (INDEPENDENT_AMBULATORY_CARE_PROVIDER_SITE_OTHER): Payer: Self-pay

## 2020-01-15 DIAGNOSIS — R739 Hyperglycemia, unspecified: Secondary | ICD-10-CM | POA: Diagnosis not present

## 2020-01-15 DIAGNOSIS — Z23 Encounter for immunization: Secondary | ICD-10-CM | POA: Diagnosis not present

## 2020-01-15 DIAGNOSIS — N182 Chronic kidney disease, stage 2 (mild): Secondary | ICD-10-CM | POA: Diagnosis not present

## 2020-02-08 DIAGNOSIS — I10 Essential (primary) hypertension: Secondary | ICD-10-CM | POA: Diagnosis not present

## 2020-02-08 DIAGNOSIS — N182 Chronic kidney disease, stage 2 (mild): Secondary | ICD-10-CM | POA: Diagnosis not present

## 2020-02-08 DIAGNOSIS — I5032 Chronic diastolic (congestive) heart failure: Secondary | ICD-10-CM | POA: Diagnosis not present

## 2020-02-08 DIAGNOSIS — Z6838 Body mass index (BMI) 38.0-38.9, adult: Secondary | ICD-10-CM | POA: Diagnosis not present

## 2020-02-08 DIAGNOSIS — R300 Dysuria: Secondary | ICD-10-CM | POA: Diagnosis not present

## 2020-02-08 DIAGNOSIS — F339 Major depressive disorder, recurrent, unspecified: Secondary | ICD-10-CM | POA: Diagnosis not present

## 2020-02-08 DIAGNOSIS — E876 Hypokalemia: Secondary | ICD-10-CM | POA: Diagnosis not present

## 2020-02-08 DIAGNOSIS — N907 Vulvar cyst: Secondary | ICD-10-CM | POA: Diagnosis not present

## 2020-02-16 ENCOUNTER — Other Ambulatory Visit: Payer: Self-pay

## 2020-02-16 ENCOUNTER — Encounter: Payer: Self-pay | Admitting: Obstetrics & Gynecology

## 2020-02-16 ENCOUNTER — Ambulatory Visit: Payer: Medicare PPO | Admitting: Obstetrics & Gynecology

## 2020-02-16 VITALS — BP 146/79 | HR 82 | Ht 63.0 in | Wt 218.0 lb

## 2020-02-16 DIAGNOSIS — N362 Urethral caruncle: Secondary | ICD-10-CM

## 2020-02-16 NOTE — Progress Notes (Signed)
Pre-operative Diagnosis:excision of urethral polyp  Post-operative Diagnosis: same  Surgeon: Florian Buff, MD   Procedure and Anesthesia:  Small polyp removed extending from the urethral meatus with cervical biopsy forceps  Hemostasis achieved with pressure and Monsel's solution  ASA Class: 1   Follow up prn Sent to pathology

## 2020-02-17 ENCOUNTER — Other Ambulatory Visit: Payer: Self-pay | Admitting: Obstetrics & Gynecology

## 2020-02-17 ENCOUNTER — Telehealth: Payer: Self-pay | Admitting: General Practice

## 2020-02-17 DIAGNOSIS — N362 Urethral caruncle: Secondary | ICD-10-CM | POA: Diagnosis not present

## 2020-02-17 NOTE — Telephone Encounter (Signed)
Patient received all 3 vaccine shots through  [place], [place] Phone:  [phone]  Fax:  [phone]

## 2020-03-08 ENCOUNTER — Other Ambulatory Visit: Payer: Self-pay

## 2020-03-08 ENCOUNTER — Ambulatory Visit: Payer: Medicare PPO | Admitting: Obstetrics & Gynecology

## 2020-03-08 ENCOUNTER — Encounter: Payer: Self-pay | Admitting: Obstetrics & Gynecology

## 2020-03-08 VITALS — BP 179/86 | HR 70 | Wt 222.0 lb

## 2020-03-08 DIAGNOSIS — N362 Urethral caruncle: Secondary | ICD-10-CM | POA: Diagnosis not present

## 2020-03-08 MED ORDER — PREMARIN 0.625 MG/GM VA CREA
1.0000 | TOPICAL_CREAM | Freq: Every day | VAGINAL | 12 refills | Status: AC
Start: 2020-03-08 — End: ?

## 2020-03-08 NOTE — Progress Notes (Signed)
Chief Complaint  Patient presents with  . Follow-up      80 y.o. No obstetric history on file. No LMP recorded. Patient has had a hysterectomy. The current method of family planning is .  Outpatient Encounter Medications as of 03/08/2020  Medication Sig Note  . acetaminophen (TYLENOL) 500 MG tablet Take 1,000 mg by mouth every 6 (six) hours as needed for moderate pain or headache.   . albuterol (PROVENTIL) (2.5 MG/3ML) 0.083% nebulizer solution Take 3 mLs (2.5 mg total) by nebulization every 4 (four) hours as needed for wheezing. (Patient taking differently: Take 2.5 mg by nebulization every 6 (six) hours as needed for wheezing.)   . alendronate (FOSAMAX) 70 MG tablet Take 70 mg by mouth every Tuesday.    . AZO-CRANBERRY PO Take 2 tablets by mouth daily as needed (kidney infections).    . Biotin w/ Vitamins C & E (HAIR/SKIN/NAILS PO) Take 3 tablets by mouth daily.    . Calcium-Phosphorus-Vitamin D (CITRACAL +D3 PO) Take 1 tablet by mouth daily.   . Cholecalciferol (VITAMIN D3) 50 MCG (2000 UT) capsule Take 2,000 Units by mouth daily.    Marland Kitchen conjugated estrogens (PREMARIN) vaginal cream Place 1 Applicatorful vaginally at bedtime. Use as directed at bedtime   . docusate sodium (COLACE) 100 MG capsule Take 100 mg by mouth at bedtime as needed for mild constipation.    . ferrous sulfate 325 (65 FE) MG tablet Take 1 tablet (325 mg total) by mouth daily.   . fluticasone (FLONASE) 50 MCG/ACT nasal spray Place 2 sprays into both nostrils daily as needed for allergies.  09/03/2019: Pt states she doesn't use it until she needs it   . Lactobacillus (FLORAJEN ACIDOPHILUS) CAPS Take 1 capsule by mouth daily before breakfast.   . MUCINEX 600 MG 12 hr tablet Take 600 mg by mouth 2 (two) times daily as needed for cough.    . nystatin ointment (MYCOSTATIN) Apply 1 application topically 2 (two) times daily as needed (for irritation).   Marland Kitchen omeprazole (PRILOSEC) 20 MG capsule Take 20 mg by mouth 2 (two)  times daily before a meal.   . ondansetron (ZOFRAN) 4 MG tablet Take 4 mg by mouth every 8 (eight) hours as needed for nausea or vomiting.   . sertraline (ZOLOFT) 100 MG tablet Take 100 mg by mouth daily.   . traZODone (DESYREL) 50 MG tablet Take 50 mg by mouth at bedtime.    No facility-administered encounter medications on file as of 03/08/2020.    Subjective S?p removal of urethral caruncle Bleeding has resolved No other complaints Past Medical History:  Diagnosis Date  . Anemia   . Anxiety   . Arthritis   . Barrett's esophagus    [facility] on [facility] egd at [facility]  . Depression   . GERD (gastroesophageal reflux disease)   . H/O hiatal hernia   . Hypertension   . Obesity   . Pneumonia   . PONV (postoperative nausea and vomiting)   . Seasonal allergies   . Sepsis ([facility]) 10/02/2013    Past Surgical History:  Procedure Laterality Date  . ABDOMINAL HYSTERECTOMY    . ABDOMINAL SURGERY    . APPENDECTOMY    . BACK SURGERY     lumbar x2 by Dr Lovell Sheehan  . BIOPSY  09/03/2019   Procedure: BIOPSY;  Surgeon: Malissa Hippo, MD;  [facility];  Service: Endoscopy;;  esophagus  . CARPAL TUNNEL RELEASE Right 03/2014   Dr.  Lorin Mercy  . CATARACT EXTRACTION W/PHACO Left 08/06/2013   Procedure: CATARACT EXTRACTION PHACO AND INTRAOCULAR LENS PLACEMENT LEFT EYE;  Surgeon: Tonny Branch, MD;  [facility];  Service: Ophthalmology;  Laterality: Left;  CDE: 9.55  . CATARACT EXTRACTION W/PHACO Right 08/24/2013   Procedure: CATARACT EXTRACTION PHACO AND INTRAOCULAR LENS PLACEMENT (IOC);  Surgeon: Tonny Branch, MD;  [facility]: [facility];  Service: Ophthalmology;  Laterality: Right;  CDE:8.30  . CERVICAL FUSION    . CHOLECYSTECTOMY    . COLONOSCOPY N/A 11/03/2013   SLF: 1. Normal mucosa in the terminal ileum. 2. 13 colon polyps removed. 3. Moderate diverticulosis in the descending colon and sigmoid colon 4. the left colon is redundant 5. Small internal  hemorrhoids.  . COLONOSCOPY N/A 09/25/2017   Procedure:  COLONOSCOPY;  Surgeon: Rogene Houston, MD;  [facility];  Service: Endoscopy;  Laterality: N/A;  1:55  . ESOPHAGOGASTRODUODENOSCOPY N/A 11/03/2013   SLF: 1. 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 Mild Non-erosive gastritis  . 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 s/p gastric bypass  . ESOPHAGOGASTRODUODENOSCOPY (EGD) WITH PROPOFOL N/A 09/03/2019   Procedure: ESOPHAGOGASTRODUODENOSCOPY (EGD) WITH PROPOFOL;  Surgeon: Rogene Houston, MD;  [facility];  Service: Endoscopy;  Laterality: N/A;  205  . EYE SURGERY    . GASTRIC BYPASS  1979   revision in 1980   . HIATAL HERNIA REPAIR     [facility] in 2011  . JOINT REPLACEMENT Right 1995   hip  . JOINT REPLACEMENT Left 2008   hip  . MEDIAL PARTIAL KNEE REPLACEMENT Left    Dr Ronnie Derby  . PARAESOPHAGEAL HERNIA REPAIR  SEP 2010 DR. MCNATT  . POLYPECTOMY  09/25/2017   Procedure: POLYPECTOMY;  Surgeon: Rogene Houston, MD;  [facility];  Service: Endoscopy;;  colon  . SHOULDER ARTHROSCOPY Right   . SHOULDER ARTHROSCOPY Right    x2  . SKIN BIOPSY    . TONSILLECTOMY    . TOTAL KNEE ARTHROPLASTY Right 09/08/2018   Procedure: RIGHT TOTAL KNEE ARTHROPLASTY;  Surgeon: Marybelle Killings, MD;  Location: [place];  Service: Orthopedics;  Laterality: Right;  . TOTAL SHOULDER ARTHROPLASTY Right 02/16/2013   Procedure: TOTAL SHOULDER ARTHROPLASTY;  Surgeon: Marybelle Killings, MD;  Location: [place];  Service: Orthopedics;  Laterality: Right;  Right Total Shoulder Arthroplasty, Cemented    OB History   No obstetric history on file.     Allergies  Allergen Reactions  . Novocain [Procaine Hcl] Other (See Comments)    Unknown-passed out with novocaine injected for dental surgery.  . Other Hives and Other (See Comments)    EKG pads - need to use  pediatric pads  . Latex Rash  . Penicillins Rash and Other (See Comments)    Has patient had a PCN reaction causing immediate rash, facial/tongue/throat swelling, SOB or lightheadedness with hypotension: Yes Has patient had a PCN reaction causing severe rash involving mucus membranes or skin necrosis: No Has patient had a PCN reaction that required hospitalization No Has patient had a PCN reaction occurring within the last 10 years: No  If all of the above answers are "NO", then may proceed with Cephalosporin use.   . Sulfa Antibiotics Rash    Social History   Socioeconomic History  . Marital status: Single    Spouse name: Not on  file  . Number of children: Not on file  . Years of education: Not on file  . Highest education level: Not on file  Occupational History  . Not on file  Tobacco Use  . Smoking status: Never Smoker  . Smokeless tobacco: Never Used  Vaping Use  . Vaping Use: Never used  Substance and Sexual Activity  . Alcohol use: No  . Drug use: No  . Sexual activity: Yes    Birth control/protection: Surgical  Other Topics Concern  . Not on file  Social History Narrative  . Not on file   Social Determinants of Health   Financial Resource Strain: Not on file  Food Insecurity: Not on file  Transportation Needs: Not on file  Physical Activity: Not on file  Stress: Not on file  Social Connections: Not on file    Family History  Problem Relation Age of Onset  . Colon cancer Brother        IN HIS 60s-METASTATIC  . Colon polyps Paternal Grandfather   . Breast cancer Mother   . Hypertension Father   . Heart disease Father   . Hypertension Brother   . Heart disease Brother     Medications:       Current Outpatient Medications:  .  acetaminophen (TYLENOL) 500 MG tablet, Take 1,000 mg by mouth every 6 (six) hours as needed for moderate pain or headache., Disp: , Rfl:  .  albuterol (PROVENTIL) (2.5 MG/3ML) 0.083% nebulizer solution, Take 3 mLs (2.5 mg total)  by nebulization every 4 (four) hours as needed for wheezing. (Patient taking differently: Take 2.5 mg by nebulization every 6 (six) hours as needed for wheezing.), Disp: 75 mL, Rfl: 12 .  alendronate (FOSAMAX) 70 MG tablet, Take 70 mg by mouth every Tuesday. , Disp: , Rfl:  .  AZO-CRANBERRY PO, Take 2 tablets by mouth daily as needed (kidney infections). , Disp: , Rfl:  .  Biotin w/ Vitamins C & E (HAIR/SKIN/NAILS PO), Take 3 tablets by mouth daily. , Disp: , Rfl:  .  Calcium-Phosphorus-Vitamin D (CITRACAL +D3 PO), Take 1 tablet by mouth daily., Disp: , Rfl:  .  Cholecalciferol (VITAMIN D3) 50 MCG (2000 UT) capsule, Take 2,000 Units by mouth daily. , Disp: , Rfl:  .  conjugated estrogens (PREMARIN) vaginal cream, Place 1 Applicatorful vaginally at bedtime. Use as directed at bedtime, Disp: 30 g, Rfl: 12 .  docusate sodium (COLACE) 100 MG capsule, Take 100 mg by mouth at bedtime as needed for mild constipation. , Disp: , Rfl:  .  ferrous sulfate 325 (65 FE) MG tablet, Take 1 tablet (325 mg total) by mouth daily., Disp: 30 tablet, Rfl: 3 .  fluticasone (FLONASE) 50 MCG/ACT nasal spray, Place 2 sprays into both nostrils daily as needed for allergies. , Disp: , Rfl:  .  Lactobacillus (FLORAJEN ACIDOPHILUS) CAPS, Take 1 capsule by mouth daily before breakfast., Disp: , Rfl:  .  MUCINEX 600 MG 12 hr tablet, Take 600 mg by mouth 2 (two) times daily as needed for cough. , Disp: , Rfl:  .  nystatin ointment (MYCOSTATIN), Apply 1 application topically 2 (two) times daily as needed (for irritation)., Disp: , Rfl:  .  omeprazole (PRILOSEC) 20 MG capsule, Take 20 mg by mouth 2 (two) times daily before a meal., Disp: , Rfl:  .  ondansetron (ZOFRAN) 4 MG tablet, Take 4 mg by mouth every 8 (eight) hours as needed for nausea or vomiting., Disp: , Rfl:  .  sertraline (ZOLOFT) 100 MG tablet, Take 100 mg by mouth daily., Disp: , Rfl:  .  traZODone (DESYREL) 50 MG tablet, Take 50 mg by mouth at bedtime., Disp: , Rfl:    Objective Blood pressure (!) 179/86, pulse 70, weight 222 lb (100.7 kg).  Base is still bright red more like a urethral caruncle at this point Not infected  Pertinent ROS No burning with urination, frequency or urgency No nausea, vomiting or diarrhea Nor fever chills or other constitutional symptoms   Labs or studies     Impression Diagnoses this Encounter::   ICD-10-CM   1. Urethral polyp, removed  N36.2    Use topical estrogen for management of area that likely is an underlying urethral caruncle    Established relevant diagnosis(es):   Plan/Recommendations: Meds ordered this encounter  Medications  . conjugated estrogens (PREMARIN) vaginal cream    Sig: Place 1 Applicatorful vaginally at bedtime. Use as directed at bedtime    Dispense:  30 g    Refill:  12    Labs or Scans Ordered: No orders of the defined types were placed in this encounter.   Management:: As above  Follow up Return in about 6 months (around 09/05/2020) for Follow up, with Dr Elonda Husky.        All questions were answered.

## 2020-04-14 ENCOUNTER — Encounter (INDEPENDENT_AMBULATORY_CARE_PROVIDER_SITE_OTHER): Payer: Self-pay | Admitting: Gastroenterology

## 2020-04-14 ENCOUNTER — Ambulatory Visit (INDEPENDENT_AMBULATORY_CARE_PROVIDER_SITE_OTHER): Payer: Medicare PPO | Admitting: Gastroenterology

## 2020-04-14 ENCOUNTER — Other Ambulatory Visit: Payer: Self-pay

## 2020-04-14 ENCOUNTER — Telehealth (INDEPENDENT_AMBULATORY_CARE_PROVIDER_SITE_OTHER): Payer: Self-pay

## 2020-04-14 ENCOUNTER — Encounter (INDEPENDENT_AMBULATORY_CARE_PROVIDER_SITE_OTHER): Payer: Self-pay

## 2020-04-14 DIAGNOSIS — K582 Mixed irritable bowel syndrome: Secondary | ICD-10-CM | POA: Diagnosis not present

## 2020-04-14 DIAGNOSIS — D508 Other iron deficiency anemias: Secondary | ICD-10-CM | POA: Diagnosis not present

## 2020-04-14 DIAGNOSIS — K862 Cyst of pancreas: Secondary | ICD-10-CM | POA: Diagnosis not present

## 2020-04-14 DIAGNOSIS — D509 Iron deficiency anemia, unspecified: Secondary | ICD-10-CM | POA: Insufficient documentation

## 2020-04-14 DIAGNOSIS — K589 Irritable bowel syndrome without diarrhea: Secondary | ICD-10-CM | POA: Insufficient documentation

## 2020-04-14 DIAGNOSIS — Z1211 Encounter for screening for malignant neoplasm of colon: Secondary | ICD-10-CM

## 2020-04-14 MED ORDER — DICYCLOMINE HCL 10 MG PO CAPS: 10.0000 mg | ORAL_CAPSULE | Freq: Two times a day (BID) | ORAL | 2 refills | Status: DC | PRN

## 2020-04-14 NOTE — H&P (View-Only) (Signed)
[name] [name], M.D. Gastroenterology & Hepatology [facility] [address] [place], [place] [address]  Primary Care Physician: [name] Labrum, MD [name] [name]  I will communicate my assessment and recommendations to the referring MD via EMR.  Problems: 1. IBS-C 2. Iron deficiency anemia  3. Barrett's esophagus without dysplasia 4. GERD  History of Present Illness: [name] [name] is a 80 y.o. female with past medical history of hypertension, GERD, depression, iron deficiency anemia, IBS-C, Barrett's esophagus without dysplasia and anxiety, who presents for follow up of abdominal pain.  The patient was last seen on 10/08/2019. At that time, the patient was ordered to have a repeat H&H due to episodes of recurrent anemia and recent hematemesis. Most recent labs on 10/08/2019 showed CBC with hemoglobin 11.8, MCV 81, although cell count 5.0, iron still low 35, ferritin 12, saturation 8%.  The patient reports that overall she has been feeling "lousy" as she has had some gradual polypoid that have caused her discomfort. Regarding her gastrointestinal symptoms, she has presented burning sensation in her LUQ since last August. The pain is described as intermittent and moderate in severity. Does not take any specific medication for this. She also reports that she has been presenting episodes of constipation and tenesmus for multiple years, alternating with diarrhea, but predominantly characterized by constipation. She lasts on average two days without having a bowel movement, for which she takes Miralax and colace, which helps her have bowel movements.  Regarding her iron deficiency anemia, the patient is taking a different formulation of iron (SlowFe) as it sits better on her abdomen.  The patient denies having any nausea, vomiting, fever, chills, hematochezia, melena, hematemesis, abdominal distention, jaundice, pruritus or  weight loss.  Last EGD: 09/03/2019 - non bleeding divertula in distal esophagus, short segment BE, evidence of gastric bypas with intact mucosa, path showed BE without dysplasia Last Colonoscopy: 09/25/2017  Seven small polyps in the sigmoid colon, at the splenic flexure, in the transverse colon and in the ascending colon, removed with a cold snare. Two small polyps in the sigmoid colon and in the ascending colon. Removed. One 12 mm polyp in the distal sigmoid colon, removed with a hot snare. Diverticulosis in the sigmoid colon. Internal hemorrhoids.  PATH-Patient had 10 polyps removed. Largest polyp was a distal sigmoid colon and estimated to be 12 mm. It is tubular adenoma. Most of the other polyps are tubular adenomas as well. 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  Past Medical History: Past Medical History:  Diagnosis Date  . Anemia   . Anxiety   . Arthritis   . Barrett's esophagus    seen on 2016 egd at [facility]  . Depression   . GERD (gastroesophageal reflux disease)   . H/O hiatal hernia   . Hypertension   . Obesity   . Pneumonia   . PONV (postoperative nausea and vomiting)   . Seasonal allergies   . Sepsis ([place]) 10/02/2013    Past Surgical History: Past Surgical History:  Procedure Laterality Date  . ABDOMINAL HYSTERECTOMY    . ABDOMINAL SURGERY    . APPENDECTOMY    . BACK SURGERY     lumbar x2 by Dr Arnoldo Morale  . BIOPSY  09/03/2019   Procedure: BIOPSY;  Surgeon: Rogene Houston, MD;  [facility];  Service: Endoscopy;;  esophagus  . CARPAL TUNNEL RELEASE Right 03/2014   Dr. Lorin Mercy  . CATARACT EXTRACTION W/PHACO Left 08/06/2013   Procedure: CATARACT EXTRACTION PHACO AND INTRAOCULAR LENS PLACEMENT LEFT EYE;  Surgeon: Tonny Branch, MD;  Location: [facility];  Service: Ophthalmology;  Laterality: Left;  CDE: 9.55  .  CATARACT EXTRACTION W/PHACO Right 08/24/2013   Procedure: CATARACT EXTRACTION PHACO AND INTRAOCULAR LENS PLACEMENT (IOC);  Surgeon: Tonny Branch, MD;  Location: [facility];  Service: Ophthalmology;  Laterality: Right;  CDE:8.30  . CERVICAL FUSION    . CHOLECYSTECTOMY    . COLONOSCOPY N/A 11/03/2013   SLF: 1. Normal mucosa in the terminal ileum. 2. 13 colon polyps removed. 3. Moderate diverticulosis in the descending colon and sigmoid colon 4. the left colon is redundant 5. Small internal  hemorrhoids.  . COLONOSCOPY N/A 09/25/2017   Procedure: COLONOSCOPY;  Surgeon: Rogene Houston, MD;  [facility];  Service: Endoscopy;  Laterality: N/A;  1:55  . ESOPHAGOGASTRODUODENOSCOPY N/A 11/03/2013   SLF: 1. 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 Mild Non-erosive gastritis  . 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 s/p gastric bypass  . ESOPHAGOGASTRODUODENOSCOPY (EGD) WITH PROPOFOL N/A 09/03/2019   Procedure: ESOPHAGOGASTRODUODENOSCOPY (EGD) WITH PROPOFOL;  Surgeon: Rogene Houston, MD;  [facility];  Service: Endoscopy;  Laterality: N/A;  205  . EYE SURGERY    . GASTRIC BYPASS  1979   revision in 1980   . HIATAL HERNIA REPAIR     [facility] in 2011  . JOINT REPLACEMENT Right 1995   hip  . JOINT REPLACEMENT Left 2008   hip  . MEDIAL PARTIAL KNEE REPLACEMENT Left    Dr Ronnie Derby  . PARAESOPHAGEAL HERNIA REPAIR  SEP 2010 DR. MCNATT  . POLYPECTOMY  09/25/2017   Procedure: POLYPECTOMY;  Surgeon: Rogene Houston, MD;  [facility];  Service: Endoscopy;;  colon  . SHOULDER ARTHROSCOPY Right   . SHOULDER ARTHROSCOPY Right    x2  . SKIN BIOPSY    . TONSILLECTOMY    . TOTAL KNEE ARTHROPLASTY Right 09/08/2018   Procedure: RIGHT TOTAL KNEE ARTHROPLASTY;  Surgeon: Marybelle Killings, MD;  Location: [place];   Service: Orthopedics;  Laterality: Right;  . TOTAL SHOULDER ARTHROPLASTY Right 02/16/2013   Procedure: TOTAL SHOULDER ARTHROPLASTY;  Surgeon: Marybelle Killings, MD;  Location: [place];  Service: Orthopedics;  Laterality: Right;  Right Total Shoulder Arthroplasty, Cemented    Family History: Family History  Problem Relation Age of Onset  . Colon cancer Brother        IN HIS 60s-METASTATIC  . Colon polyps Paternal Grandfather   . Breast cancer Mother   . Hypertension Father   . Heart disease Father   . Hypertension Brother   . Heart disease Brother     Social History: Social History   Tobacco Use  Smoking Status Never Smoker  Smokeless Tobacco Never Used   Social History   Substance and Sexual Activity  Alcohol Use No   Social History   Substance and Sexual Activity  Drug Use No    Allergies: Allergies  Allergen Reactions  . Novocain [Procaine Hcl] Other (See Comments)    Unknown-passed out with novocaine injected for dental surgery.  . Other Hives and Other (See  Comments)    EKG pads - need to use pediatric pads  . Latex Rash  . Penicillins Rash and Other (See Comments)    Has patient had a PCN reaction causing immediate rash, facial/tongue/throat swelling, SOB or lightheadedness with hypotension: Yes Has patient had a PCN reaction causing severe rash involving mucus membranes or skin necrosis: No Has patient had a PCN reaction that required hospitalization No Has patient had a PCN reaction occurring within the last 10 years: No  If all of the above answers are "NO", then may proceed with Cephalosporin use.   . Sulfa Antibiotics Rash    Medications: Current Outpatient Medications  Medication Sig Dispense Refill  . acetaminophen (TYLENOL) 500 MG tablet Take 1,000 mg by mouth every 6 (six) hours as needed for moderate pain or headache.    . albuterol (PROVENTIL) (2.5 MG/3ML) 0.083% nebulizer solution Take 3 mLs (2.5 mg total) by nebulization every 4 (four) hours as  needed for wheezing. (Patient taking differently: Take 2.5 mg by nebulization every 6 (six) hours as needed for wheezing.) 75 mL 12  . alendronate (FOSAMAX) 70 MG tablet Take 70 mg by mouth every Tuesday.     . AZO-CRANBERRY PO Take 2 tablets by mouth daily as needed (kidney infections).     . Biotin w/ Vitamins C & E (HAIR/SKIN/NAILS PO) Take 3 tablets by mouth daily.     . Calcium-Phosphorus-Vitamin D (CITRACAL +D3 PO) Take 1 tablet by mouth daily.    . Cholecalciferol (VITAMIN D3) 50 MCG (2000 UT) capsule Take 2,000 Units by mouth daily.     Marland Kitchen conjugated estrogens (PREMARIN) vaginal cream Place 1 Applicatorful vaginally at bedtime. Use as directed at bedtime 30 g 12  . docusate sodium (COLACE) 100 MG capsule Take 100 mg by mouth at bedtime as needed for mild constipation.     . ferrous sulfate 325 (65 FE) MG tablet Take 1 tablet (325 mg total) by mouth daily. 30 tablet 3  . fluticasone (FLONASE) 50 MCG/ACT nasal spray Place 2 sprays into both nostrils daily as needed for allergies.     . Lactobacillus (FLORAJEN ACIDOPHILUS) CAPS Take 1 capsule by mouth daily before breakfast.    . MUCINEX 600 MG 12 hr tablet Take 600 mg by mouth 2 (two) times daily as needed for cough.     . nystatin ointment (MYCOSTATIN) Apply 1 application topically 2 (two) times daily as needed (for irritation).    Marland Kitchen omeprazole (PRILOSEC) 20 MG capsule Take 20 mg by mouth 2 (two) times daily before a meal.    . ondansetron (ZOFRAN) 4 MG tablet Take 4 mg by mouth every 8 (eight) hours as needed for nausea or vomiting.    . sertraline (ZOLOFT) 100 MG tablet Take 100 mg by mouth daily.    . traZODone (DESYREL) 50 MG tablet Take 50 mg by mouth at bedtime.     No current facility-administered medications for this visit.    Review of Systems: GENERAL: negative for malaise, night sweats HEENT: No changes in hearing or vision, no nose bleeds or other nasal problems. NECK: Negative for lumps, goiter, pain and significant neck  swelling RESPIRATORY: Negative for cough, wheezing CARDIOVASCULAR: Negative for chest pain, leg swelling, palpitations, orthopnea GI: SEE HPI MUSCULOSKELETAL: Negative for joint pain or swelling, back pain, and muscle pain. SKIN: Negative for lesions, rash PSYCH: Negative for sleep disturbance, mood disorder and recent psychosocial stressors. HEMATOLOGY Negative for prolonged bleeding, bruising easily, and swollen nodes. ENDOCRINE: Negative for cold  or heat intolerance, polyuria, polydipsia and goiter. NEURO: negative for tremor, gait imbalance, syncope and seizures. The remainder of the review of systems is noncontributory.   Physical Exam: BP (!) 173/82 (BP Location: Left Arm, Patient Position: Sitting, Cuff Size: Large)   Pulse 73   Temp 98 F (36.7 C) (Oral)   Ht 5\' 3"  (1.6 m)   Wt 221 lb (100.2 kg)   BMI 39.15 kg/m  GENERAL: The patient is AO x3, in no acute distress. Obese. Sitting in roller aide. HEENT: Head is normocephalic and atraumatic. EOMI are intact. Mouth is well hydrated and without lesions. NECK: Supple. No masses LUNGS: Clear to auscultation. No presence of rhonchi/wheezing/rales. Adequate chest expansion HEART: RRR, normal s1 and s2. ABDOMEN: Mildly tender to palpation diffusely, no guarding, no peritoneal signs, and nondistended. BS +. No masses. EXTREMITIES: Without any cyanosis, clubbing, rash, lesions or edema. NEUROLOGIC: AOx3, no focal motor deficit. SKIN: no jaundice, no rashes  Imaging/Labs: as above  I personally reviewed and interpreted the available labs, imaging and endoscopic files.  Impression and Plan: [name] [name] is a 80 y.o. female with past medical history of hypertension, GERD, depression, iron deficiency anemia, IBS-C, Barrett's esophagus without dysplasia and anxiety, who presents for follow up of abdominal pain. The patient has presented recurrent episodes of abdominal pain of unclear etiology. She has undergone relatively recent  endoscopic investigations that have been negative for a clear source for her symptoms. It is very likely her symptoms are related to IBS-C, for which I recommended the patient to take MiraLAX to increase her bowel movement frequency, while also taking Bentyl as needed to decrease the abdominal pain. For now, we will also check a CMP. We will also address the status of her iron deficiency anemia by checking repeat iron stores and CBC. In terms of her history of a pancreatic cyst, will need to proceed with a CT pancreas for surveillance of this lesion before discussing further steps in care. Finally, patient had multiple polyps in her colon and she will have a repeat colonoscopy within 3 years after her last colonoscopy, which we will schedule today.  - Check CBC, CMP, iron panel - Schedule colonoscopy - Schedule CT pancreas with IV contrast - Can take Bentyl as needed for abdominal pain - Start taking Miralax 1/2 cap every day for one week.  If bowel movements do not improve, increase to 1 cup every day. If bowel movements do not improve, increase to 1 cup every 12 hours. If after two weeks there is no improvement, increase to 1 cup every 8 hours - RTC 6 months  All questions were answered.      Harvel Quale, MD Gastroenterology and Hepatology [facility]

## 2020-04-14 NOTE — Progress Notes (Signed)
Maylon Peppers, M.D. Gastroenterology & Hepatology [facility] [address] [place], [place] [address]  Primary Care Physician: Curlene Labrum, MD [place] [id number]  I will communicate my assessment and recommendations to the referring MD via EMR.  Problems: 1. IBS-C 2. Iron deficiency anemia  3. Barrett's esophagus without dysplasia 4. GERD  History of Present Illness: [name] [name] is a 80 y.o. female with past medical history of hypertension, GERD, depression, iron deficiency anemia, IBS-C, Barrett's esophagus without dysplasia and anxiety, who presents for follow up of abdominal pain.  The patient was last seen on 10/08/2019. At that time, the patient was ordered to have a repeat H&H due to episodes of recurrent anemia and recent hematemesis. Most recent labs on 10/08/2019 showed CBC with hemoglobin 11.8, MCV 81, although cell count 5.0, iron still low 35, ferritin 12, saturation 8%.  The patient reports that overall she has been feeling "lousy" as she has had some gradual polypoid that have caused her discomfort. Regarding her gastrointestinal symptoms, she has presented burning sensation in her LUQ since last August. The pain is described as intermittent and moderate in severity. Does not take any specific medication for this. She also reports that she has been presenting episodes of constipation and tenesmus for multiple years, alternating with diarrhea, but predominantly characterized by constipation. She lasts on average two days without having a bowel movement, for which she takes Miralax and colace, which helps her have bowel movements.  Regarding her iron deficiency anemia, the patient is taking a different formulation of iron (SlowFe) as it sits better on her abdomen.  The patient denies having any nausea, vomiting, fever, chills, hematochezia, melena, hematemesis, abdominal distention, jaundice, pruritus or  weight loss.  Last EGD: 09/03/2019 - non bleeding divertula in distal esophagus, short segment BE, evidence of gastric bypas with intact mucosa, path showed BE without dysplasia Last Colonoscopy: 09/25/2017  Seven small polyps in the sigmoid colon, at the splenic flexure, in the transverse colon and in the ascending colon, removed with a cold snare. Two small polyps in the sigmoid colon and in the ascending colon. Removed. One 12 mm polyp in the distal sigmoid colon, removed with a hot snare. Diverticulosis in the sigmoid colon. Internal hemorrhoids.  PATH-Patient had 10 polyps removed. Largest polyp was a distal sigmoid colon and estimated to be 12 mm. It is tubular adenoma. Most of the other polyps are tubular adenomas as well. 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  Past Medical History: Past Medical History:  Diagnosis Date  . Anemia   . Anxiety   . Arthritis   . Barrett's esophagus    seen on 2016 egd at [facility]  . Depression   . GERD (gastroesophageal reflux disease)   . H/O hiatal hernia   . Hypertension   . Obesity   . Pneumonia   . PONV (postoperative nausea and vomiting)   . Seasonal allergies   . Sepsis ([place]) 10/02/2013    Past Surgical History: Past Surgical History:  Procedure Laterality Date  . ABDOMINAL HYSTERECTOMY    . ABDOMINAL SURGERY    . APPENDECTOMY    . BACK SURGERY     lumbar x2 by Dr Arnoldo Morale  . BIOPSY  09/03/2019   Procedure: BIOPSY;  Surgeon: Rogene Houston, MD;  [facility];  Service: Endoscopy;;  esophagus  . CARPAL TUNNEL RELEASE Right 03/2014   Dr. Lorin Mercy  . CATARACT EXTRACTION W/PHACO Left 08/06/2013   Procedure: CATARACT EXTRACTION PHACO AND INTRAOCULAR LENS PLACEMENT LEFT EYE;  Surgeon: Tonny Branch, MD;  Location: [facility] OR[facility];  Service: Ophthalmology;  Laterality: Left;  CDE: 9.55  .  CATARACT EXTRACTION W/PHACO Right 08/24/2013   Procedure: CATARACT EXTRACTION PHACO AND INTRAOCULAR LENS PLACEMENT (IOC);  Surgeon: Tonny Branch, MD;  Location: [facility];  Service: Ophthalmology;  Laterality: Right;  CDE:8.30  . CERVICAL FUSION    . CHOLECYSTECTOMY    . COLONOSCOPY N/A 11/03/2013   SLF: 1. Normal mucosa in the terminal ileum. 2. 13 colon polyps removed. 3. Moderate diverticulosis in the descending colon and sigmoid colon 4. the left colon is redundant 5. Small internal  hemorrhoids.  . COLONOSCOPY N/A 09/25/2017   Procedure: COLONOSCOPY;  Surgeon: Rogene Houston, MD;  [facility];  Service: Endoscopy;  Laterality: N/A;  1:55  . ESOPHAGOGASTRODUODENOSCOPY N/A 11/03/2013   SLF: 1. 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 Mild Non-erosive gastritis  . 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 s/p gastric bypass  . ESOPHAGOGASTRODUODENOSCOPY (EGD) WITH PROPOFOL N/A 09/03/2019   Procedure: ESOPHAGOGASTRODUODENOSCOPY (EGD) WITH PROPOFOL;  Surgeon: Rogene Houston, MD;  [facility];  Service: Endoscopy;  Laterality: N/A;  205  . EYE SURGERY    . GASTRIC BYPASS  1979   revision in 1980   . HIATAL HERNIA REPAIR     [facility] in 2011  . JOINT REPLACEMENT Right 1995   hip  . JOINT REPLACEMENT Left 2008   hip  . MEDIAL PARTIAL KNEE REPLACEMENT Left    Dr Ronnie Derby  . PARAESOPHAGEAL HERNIA REPAIR  SEP 2010 DR. MCNATT  . POLYPECTOMY  09/25/2017   Procedure: POLYPECTOMY;  Surgeon: Rogene Houston, MD;  [facility];  Service: Endoscopy;;  colon  . SHOULDER ARTHROSCOPY Right   . SHOULDER ARTHROSCOPY Right    x2  . SKIN BIOPSY    . TONSILLECTOMY    . TOTAL KNEE ARTHROPLASTY Right 09/08/2018   Procedure: RIGHT TOTAL KNEE ARTHROPLASTY;  Surgeon: Marybelle Killings, MD;  Location: [place];   Service: Orthopedics;  Laterality: Right;  . TOTAL SHOULDER ARTHROPLASTY Right 02/16/2013   Procedure: TOTAL SHOULDER ARTHROPLASTY;  Surgeon: Marybelle Killings, MD;  Location: [place];  Service: Orthopedics;  Laterality: Right;  Right Total Shoulder Arthroplasty, Cemented    Family History: Family History  Problem Relation Age of Onset  . Colon cancer Brother        IN HIS 60s-METASTATIC  . Colon polyps Paternal Grandfather   . Breast cancer Mother   . Hypertension Father   . Heart disease Father   . Hypertension Brother   . Heart disease Brother     Social History: Social History   Tobacco Use  Smoking Status Never Smoker  Smokeless Tobacco Never Used   Social History   Substance and Sexual Activity  Alcohol Use No   Social History   Substance and Sexual Activity  Drug Use No    Allergies: Allergies  Allergen Reactions  . Novocain [Procaine Hcl] Other (See Comments)    Unknown-passed out with novocaine injected for dental surgery.  . Other Hives and Other (See  Comments)    EKG pads - need to use pediatric pads  . Latex Rash  . Penicillins Rash and Other (See Comments)    Has patient had a PCN reaction causing immediate rash, facial/tongue/throat swelling, SOB or lightheadedness with hypotension: Yes Has patient had a PCN reaction causing severe rash involving mucus membranes or skin necrosis: No Has patient had a PCN reaction that required hospitalization No Has patient had a PCN reaction occurring within the last 10 years: No  If all of the above answers are "NO", then may proceed with Cephalosporin use.   . Sulfa Antibiotics Rash    Medications: Current Outpatient Medications  Medication Sig Dispense Refill  . acetaminophen (TYLENOL) 500 MG tablet Take 1,000 mg by mouth every 6 (six) hours as needed for moderate pain or headache.    . albuterol (PROVENTIL) (2.5 MG/3ML) 0.083% nebulizer solution Take 3 mLs (2.5 mg total) by nebulization every 4 (four) hours as  needed for wheezing. (Patient taking differently: Take 2.5 mg by nebulization every 6 (six) hours as needed for wheezing.) 75 mL 12  . alendronate (FOSAMAX) 70 MG tablet Take 70 mg by mouth every Tuesday.     . AZO-CRANBERRY PO Take 2 tablets by mouth daily as needed (kidney infections).     . Biotin w/ Vitamins C & E (HAIR/SKIN/NAILS PO) Take 3 tablets by mouth daily.     . Calcium-Phosphorus-Vitamin D (CITRACAL +D3 PO) Take 1 tablet by mouth daily.    . Cholecalciferol (VITAMIN D3) 50 MCG (2000 UT) capsule Take 2,000 Units by mouth daily.     Marland Kitchen conjugated estrogens (PREMARIN) vaginal cream Place 1 Applicatorful vaginally at bedtime. Use as directed at bedtime 30 g 12  . docusate sodium (COLACE) 100 MG capsule Take 100 mg by mouth at bedtime as needed for mild constipation.     . ferrous sulfate 325 (65 FE) MG tablet Take 1 tablet (325 mg total) by mouth daily. 30 tablet 3  . fluticasone (FLONASE) 50 MCG/ACT nasal spray Place 2 sprays into both nostrils daily as needed for allergies.     . Lactobacillus (FLORAJEN ACIDOPHILUS) CAPS Take 1 capsule by mouth daily before breakfast.    . MUCINEX 600 MG 12 hr tablet Take 600 mg by mouth 2 (two) times daily as needed for cough.     . nystatin ointment (MYCOSTATIN) Apply 1 application topically 2 (two) times daily as needed (for irritation).    Marland Kitchen omeprazole (PRILOSEC) 20 MG capsule Take 20 mg by mouth 2 (two) times daily before a meal.    . ondansetron (ZOFRAN) 4 MG tablet Take 4 mg by mouth every 8 (eight) hours as needed for nausea or vomiting.    . sertraline (ZOLOFT) 100 MG tablet Take 100 mg by mouth daily.    . traZODone (DESYREL) 50 MG tablet Take 50 mg by mouth at bedtime.     No current facility-administered medications for this visit.    Review of Systems: GENERAL: negative for malaise, night sweats HEENT: No changes in hearing or vision, no nose bleeds or other nasal problems. NECK: Negative for lumps, goiter, pain and significant neck  swelling RESPIRATORY: Negative for cough, wheezing CARDIOVASCULAR: Negative for chest pain, leg swelling, palpitations, orthopnea GI: SEE HPI MUSCULOSKELETAL: Negative for joint pain or swelling, back pain, and muscle pain. SKIN: Negative for lesions, rash PSYCH: Negative for sleep disturbance, mood disorder and recent psychosocial stressors. HEMATOLOGY Negative for prolonged bleeding, bruising easily, and swollen nodes. ENDOCRINE: Negative for cold  or heat intolerance, polyuria, polydipsia and goiter. NEURO: negative for tremor, gait imbalance, syncope and seizures. The remainder of the review of systems is noncontributory.   Physical Exam: BP (!) 173/82 (BP Location: Left Arm, Patient Position: Sitting, Cuff Size: Large)   Pulse 73   Temp 98 F (36.7 C) (Oral)   Ht 5\' 3"  (1.6 m)   Wt 221 lb (100.2 kg)   BMI 39.15 kg/m  GENERAL: The patient is AO x3, in no acute distress. Obese. Sitting in roller aide. HEENT: Head is normocephalic and atraumatic. EOMI are intact. Mouth is well hydrated and without lesions. NECK: Supple. No masses LUNGS: Clear to auscultation. No presence of rhonchi/wheezing/rales. Adequate chest expansion HEART: RRR, normal s1 and s2. ABDOMEN: Mildly tender to palpation diffusely, no guarding, no peritoneal signs, and nondistended. BS +. No masses. EXTREMITIES: Without any cyanosis, clubbing, rash, lesions or edema. NEUROLOGIC: AOx3, no focal motor deficit. SKIN: no jaundice, no rashes  Imaging/Labs: as above  I personally reviewed and interpreted the available labs, imaging and endoscopic files.  Impression and Plan: [name] [name] is a 80 y.o. female with past medical history of hypertension, GERD, depression, iron deficiency anemia, IBS-C, Barrett's esophagus without dysplasia and anxiety, who presents for follow up of abdominal pain. The patient has presented recurrent episodes of abdominal pain of unclear etiology. She has undergone relatively recent  endoscopic investigations that have been negative for a clear source for her symptoms. It is very likely her symptoms are related to IBS-C, for which I recommended the patient to take MiraLAX to increase her bowel movement frequency, while also taking Bentyl as needed to decrease the abdominal pain. For now, we will also check a CMP. We will also address the status of her iron deficiency anemia by checking repeat iron stores and CBC. In terms of her history of a pancreatic cyst, will need to proceed with a CT pancreas for surveillance of this lesion before discussing further steps in care. Finally, patient had multiple polyps in her colon and she will have a repeat colonoscopy within 3 years after her last colonoscopy, which we will schedule today.  - Check CBC, CMP, iron panel - Schedule colonoscopy - Schedule CT pancreas with IV contrast - Can take Bentyl as needed for abdominal pain - Start taking Miralax 1/2 cap every day for one week.  If bowel movements do not improve, increase to 1 cup every day. If bowel movements do not improve, increase to 1 cup every 12 hours. If after two weeks there is no improvement, increase to 1 cup every 8 hours - RTC 6 months  All questions were answered.      Harvel Quale, MD Gastroenterology and Hepatology [facility]

## 2020-04-14 NOTE — Patient Instructions (Signed)
Perform blood workup Schedule colonoscopy Schedule CT pancreas with IV contrast Can take Bentyl as needed for abdominal pain Start taking Miralax 1/2 cap every day for one week.  If bowel movements do not improve, increase to 1 cup every day. If bowel movements do not improve, increase to 1 cup every 12 hours. If after two weeks there is no improvement, increase to 1 cup every 8 hours

## 2020-04-15 DIAGNOSIS — D508 Other iron deficiency anemias: Secondary | ICD-10-CM | POA: Diagnosis not present

## 2020-04-15 DIAGNOSIS — K582 Mixed irritable bowel syndrome: Secondary | ICD-10-CM | POA: Diagnosis not present

## 2020-04-15 MED ORDER — NA SULFATE-K SULFATE-MG SULF 17.5-3.13-1.6 GM/177ML PO SOLN: 354.0000 mL | Freq: Once | ORAL | 0 refills | Status: AC

## 2020-04-15 NOTE — Telephone Encounter (Signed)
LeighAnn Maye Parkinson, CMA  

## 2020-04-16 LAB — COMPREHENSIVE METABOLIC PANEL
AG Ratio: 2 (calc) (ref 1.0–2.5)
ALT: 9 U/L (ref 6–29)
AST: 13 U/L (ref 10–35)
Albumin: 4.1 g/dL (ref 3.6–5.1)
Alkaline phosphatase (APISO): 63 U/L (ref 37–153)
BUN/Creatinine Ratio: 24 (calc) — ABNORMAL HIGH (ref 6–22)
BUN: 24 mg/dL (ref 7–25)
CO2: 30 mmol/L (ref 20–32)
Calcium: 8.8 mg/dL (ref 8.6–10.4)
Chloride: 105 mmol/L (ref 98–110)
Creat: 0.99 mg/dL — ABNORMAL HIGH (ref 0.60–0.93)
Globulin: 2.1 g/dL (calc) (ref 1.9–3.7)
Glucose, Bld: 119 mg/dL (ref 65–139)
Potassium: 4.8 mmol/L (ref 3.5–5.3)
Sodium: 141 mmol/L (ref 135–146)
Total Bilirubin: 0.4 mg/dL (ref 0.2–1.2)
Total Protein: 6.2 g/dL (ref 6.1–8.1)

## 2020-04-16 LAB — CBC WITH DIFFERENTIAL/PLATELET
Absolute Monocytes: 603 cells/uL (ref 200–950)
Basophils Absolute: 40 cells/uL (ref 0–200)
Basophils Relative: 0.9 %
Eosinophils Absolute: 119 cells/uL (ref 15–500)
Eosinophils Relative: 2.7 %
HCT: 42.4 % (ref 35.0–45.0)
Hemoglobin: 13.8 g/dL (ref 11.7–15.5)
Lymphs Abs: 805 cells/uL — ABNORMAL LOW (ref 850–3900)
MCH: 29.5 pg (ref 27.0–33.0)
MCHC: 32.5 g/dL (ref 32.0–36.0)
MCV: 90.6 fL (ref 80.0–100.0)
MPV: 11.8 fL (ref 7.5–12.5)
Monocytes Relative: 13.7 %
Neutro Abs: 2834 cells/uL (ref 1500–7800)
Neutrophils Relative %: 64.4 %
Platelets: 133 10*3/uL — ABNORMAL LOW (ref 140–400)
RBC: 4.68 10*6/uL (ref 3.80–5.10)
RDW: 13.6 % (ref 11.0–15.0)
Total Lymphocyte: 18.3 %
WBC: 4.4 10*3/uL (ref 3.8–10.8)

## 2020-04-16 LAB — IRON, TOTAL/TOTAL IRON BINDING CAP
%SAT: 22 % (calc) (ref 16–45)
Iron: 83 ug/dL (ref 45–160)
TIBC: 375 mcg/dL (calc) (ref 250–450)

## 2020-04-25 NOTE — Patient Instructions (Signed)
[name] [name]  04/25/2020     @PREFPERIOPPHARMACY @   Your procedure is scheduled on  04/29/2020   Report to [facility] at  76  A.M.   Call this number if you have problems the morning of surgery:  [phone]   Remember:  Follow the diet and prep instructions given to you by the office.                     Take these medicines the morning of surgery with A SIP OF WATER  Prilosec, zofran (if needed), zoloft. Use your nebulizer before you come.    Brush your teeth.  Do not wear jewelry, make-up or nail polish.  Do not wear lotions, powders, or perfumes, or deodorant.  Do not shave 48 hours prior to surgery.  Men may shave face and neck.  Do not bring valuables to the hospital.  [facility] is not responsible for any belongings or valuables.   Contacts, dentures or bridgework may not be worn into surgery.  Leave your suitcase in the car.  After surgery it may be brought to your room.  For patients admitted to the hospital, discharge time will be determined by your treatment team.  Patients discharged the day of surgery will not be allowed to drive home and must have someone with them for 24 hours.   Special instructions:  DO NOT smoke tobacco or vape the morning of your procedure.  Please read over the following fact sheets that you were given. Anesthesia Post-op Instructions and Care and Recovery After Surgery       Colonoscopy, Adult, Care After This sheet gives you information about how to care for yourself after your procedure. Your health care provider may also give you more specific instructions. If you have problems or questions, contact your health care provider. What can I expect after the procedure? After the procedure, it is common to have:  A small amount of blood in your stool for 24 hours after the procedure.  Some gas.  Mild cramping or bloating of your abdomen. Follow these instructions at home: Eating and drinking  Drink enough fluid  to keep your urine pale yellow.  Follow instructions from your health care provider about eating or drinking restrictions.  Resume your normal diet as instructed by your health care provider. Avoid heavy or fried foods that are hard to digest.   Activity  Rest as told by your health care provider.  Avoid sitting for a long time without moving. Get up to take short walks every 1-2 hours. This is important to improve blood flow and breathing. Ask for help if you feel weak or unsteady.  Return to your normal activities as told by your health care provider. Ask your health care provider what activities are safe for you. Managing cramping and bloating  Try walking around when you have cramps or feel bloated.  Apply heat to your abdomen as told by your health care provider. Use the heat source that your health care provider recommends, such as a moist heat pack or a heating pad. ? Place a towel between your skin and the heat source. ? Leave the heat on for 20-30 minutes. ? Remove the heat if your skin turns bright red. This is especially important if you are unable to feel pain, heat, or cold. You may have a greater risk of getting burned.   General instructions  If you were given a sedative  during the procedure, it can affect you for several hours. Do not drive or operate machinery until your health care provider says that it is safe.  For the first 24 hours after the procedure: ? Do not sign important documents. ? Do not drink alcohol. ? Do your regular daily activities at a slower pace than normal. ? Eat soft foods that are easy to digest.  Take over-the-counter and prescription medicines only as told by your health care provider.  Keep all follow-up visits as told by your health care provider. This is important. Contact a health care provider if:  You have blood in your stool 2-3 days after the procedure. Get help right away if you have:  More than a small spotting of blood in  your stool.  Large blood clots in your stool.  Swelling of your abdomen.  Nausea or vomiting.  A fever.  Increasing pain in your abdomen that is not relieved with medicine. Summary  After the procedure, it is common to have a small amount of blood in your stool. You may also have mild cramping and bloating of your abdomen.  If you were given a sedative during the procedure, it can affect you for several hours. Do not drive or operate machinery until your health care provider says that it is safe.  Get help right away if you have a lot of blood in your stool, nausea or vomiting, a fever, or increased pain in your abdomen. This information is not intended to replace advice given to you by your health care provider. Make sure you discuss any questions you have with your health care provider. Document Revised: 02/13/2019 Document Reviewed: 09/15/2018 Elsevier Patient Education  2021 [place] After This sheet gives you information about how to care for yourself after your procedure. Your health care provider may also give you more specific instructions. If you have problems or questions, contact your health care provider. What can I expect after the procedure? After the procedure, it is common to have:  Tiredness.  Forgetfulness about what happened after the procedure.  Impaired judgment for important decisions.  Nausea or vomiting.  Some difficulty with balance. Follow these instructions at home: For the time period you were told by your health care provider:  Rest as needed.  Do not participate in activities where you could fall or become injured.  Do not drive or use machinery.  Do not drink alcohol.  Do not take sleeping pills or medicines that cause drowsiness.  Do not make important decisions or sign legal documents.  Do not take care of children on your own.      Eating and drinking  Follow the diet that is recommended by  your health care provider.  Drink enough fluid to keep your urine pale yellow.  If you vomit: ? Drink water, juice, or soup when you can drink without vomiting. ? Make sure you have little or no nausea before eating solid foods. General instructions  Have a responsible adult stay with you for the time you are told. It is important to have someone help care for you until you are awake and alert.  Take over-the-counter and prescription medicines only as told by your health care provider.  If you have sleep apnea, surgery and certain medicines can increase your risk for breathing problems. Follow instructions from your health care provider about wearing your sleep device: ? Anytime you are sleeping, including during daytime naps. ? While taking prescription  pain medicines, sleeping medicines, or medicines that make you drowsy.  Avoid smoking.  Keep all follow-up visits as told by your health care provider. This is important. Contact a health care provider if:  You keep feeling nauseous or you keep vomiting.  You feel light-headed.  You are still sleepy or having trouble with balance after 24 hours.  You develop a rash.  You have a fever.  You have redness or swelling around the IV site. Get help right away if:  You have trouble breathing.  You have new-onset confusion at home. Summary  For several hours after your procedure, you may feel tired. You may also be forgetful and have poor judgment.  Have a responsible adult stay with you for the time you are told. It is important to have someone help care for you until you are awake and alert.  Rest as told. Do not drive or operate machinery. Do not drink alcohol or take sleeping pills.  Get help right away if you have trouble breathing, or if you suddenly become confused. This information is not intended to replace advice given to you by your health care provider. Make sure you discuss any questions you have with your health  care provider. Document Revised: 11/05/2019 Document Reviewed: 01/22/2019 Elsevier Patient Education  2021 [employer].

## 2020-04-27 ENCOUNTER — Encounter (HOSPITAL_COMMUNITY)
Admission: RE | Admit: 2020-04-27 | Discharge: 2020-04-27 | Disposition: A | Discharge status: Home / Self Care | Payer: Medicare PPO | Source: Ambulatory Visit | Attending: Gastroenterology | Admitting: Gastroenterology

## 2020-04-27 ENCOUNTER — Other Ambulatory Visit: Payer: Self-pay

## 2020-04-27 ENCOUNTER — Other Ambulatory Visit (HOSPITAL_COMMUNITY)
Admission: RE | Admit: 2020-04-27 | Discharge: 2020-04-27 | Disposition: A | Discharge status: Home / Self Care | Payer: Medicare PPO | Source: Ambulatory Visit | Attending: Gastroenterology | Admitting: Gastroenterology

## 2020-04-27 DIAGNOSIS — Z01812 Encounter for preprocedural laboratory examination: Secondary | ICD-10-CM | POA: Insufficient documentation

## 2020-04-27 DIAGNOSIS — Z20822 Contact with and (suspected) exposure to covid-19: Secondary | ICD-10-CM | POA: Diagnosis not present

## 2020-04-27 LAB — SARS CORONAVIRUS 2 (TAT 6-24 HRS): SARS Coronavirus 2: NEGATIVE

## 2020-04-29 ENCOUNTER — Encounter (HOSPITAL_COMMUNITY)
Admission: RE | Disposition: A | Discharge status: Home / Self Care | Payer: Self-pay | Source: Home / Self Care | Attending: Gastroenterology

## 2020-04-29 ENCOUNTER — Ambulatory Visit (HOSPITAL_COMMUNITY): Payer: Medicare PPO | Admitting: Certified Registered"

## 2020-04-29 ENCOUNTER — Ambulatory Visit (HOSPITAL_COMMUNITY)
Admission: RE | Admit: 2020-04-29 | Discharge: 2020-04-29 | Disposition: A | Discharge status: Home / Self Care | Payer: Medicare PPO | Attending: Gastroenterology | Admitting: Gastroenterology

## 2020-04-29 ENCOUNTER — Encounter (HOSPITAL_COMMUNITY): Payer: Self-pay | Admitting: Gastroenterology

## 2020-04-29 DIAGNOSIS — Z86010 Personal history of colonic polyps: Secondary | ICD-10-CM | POA: Diagnosis not present

## 2020-04-29 DIAGNOSIS — K2270 Barrett's esophagus without dysplasia: Secondary | ICD-10-CM | POA: Diagnosis not present

## 2020-04-29 DIAGNOSIS — Z90710 Acquired absence of both cervix and uterus: Secondary | ICD-10-CM | POA: Insufficient documentation

## 2020-04-29 DIAGNOSIS — I10 Essential (primary) hypertension: Secondary | ICD-10-CM | POA: Insufficient documentation

## 2020-04-29 DIAGNOSIS — K219 Gastro-esophageal reflux disease without esophagitis: Secondary | ICD-10-CM | POA: Insufficient documentation

## 2020-04-29 DIAGNOSIS — Z96651 Presence of right artificial knee joint: Secondary | ICD-10-CM | POA: Insufficient documentation

## 2020-04-29 DIAGNOSIS — D123 Benign neoplasm of transverse colon: Secondary | ICD-10-CM

## 2020-04-29 DIAGNOSIS — D124 Benign neoplasm of descending colon: Secondary | ICD-10-CM

## 2020-04-29 DIAGNOSIS — Z91040 Latex allergy status: Secondary | ICD-10-CM | POA: Diagnosis not present

## 2020-04-29 DIAGNOSIS — K5730 Diverticulosis of large intestine without perforation or abscess without bleeding: Secondary | ICD-10-CM | POA: Insufficient documentation

## 2020-04-29 DIAGNOSIS — F418 Other specified anxiety disorders: Secondary | ICD-10-CM | POA: Diagnosis not present

## 2020-04-29 DIAGNOSIS — D122 Benign neoplasm of ascending colon: Secondary | ICD-10-CM | POA: Diagnosis not present

## 2020-04-29 DIAGNOSIS — Z09 Encounter for follow-up examination after completed treatment for conditions other than malignant neoplasm: Secondary | ICD-10-CM | POA: Diagnosis not present

## 2020-04-29 DIAGNOSIS — Z884 Allergy status to anesthetic agent status: Secondary | ICD-10-CM | POA: Insufficient documentation

## 2020-04-29 DIAGNOSIS — Z8249 Family history of ischemic heart disease and other diseases of the circulatory system: Secondary | ICD-10-CM | POA: Insufficient documentation

## 2020-04-29 DIAGNOSIS — K581 Irritable bowel syndrome with constipation: Secondary | ICD-10-CM | POA: Diagnosis not present

## 2020-04-29 DIAGNOSIS — Z96611 Presence of right artificial shoulder joint: Secondary | ICD-10-CM | POA: Insufficient documentation

## 2020-04-29 DIAGNOSIS — Z6839 Body mass index (BMI) 39.0-39.9, adult: Secondary | ICD-10-CM | POA: Insufficient documentation

## 2020-04-29 DIAGNOSIS — Z96643 Presence of artificial hip joint, bilateral: Secondary | ICD-10-CM | POA: Diagnosis not present

## 2020-04-29 DIAGNOSIS — D120 Benign neoplasm of cecum: Secondary | ICD-10-CM

## 2020-04-29 DIAGNOSIS — E669 Obesity, unspecified: Secondary | ICD-10-CM | POA: Diagnosis not present

## 2020-04-29 DIAGNOSIS — Z981 Arthrodesis status: Secondary | ICD-10-CM | POA: Diagnosis not present

## 2020-04-29 DIAGNOSIS — Z9049 Acquired absence of other specified parts of digestive tract: Secondary | ICD-10-CM | POA: Insufficient documentation

## 2020-04-29 DIAGNOSIS — Z803 Family history of malignant neoplasm of breast: Secondary | ICD-10-CM | POA: Insufficient documentation

## 2020-04-29 DIAGNOSIS — D509 Iron deficiency anemia, unspecified: Secondary | ICD-10-CM | POA: Insufficient documentation

## 2020-04-29 DIAGNOSIS — Z8371 Family history of colonic polyps: Secondary | ICD-10-CM | POA: Insufficient documentation

## 2020-04-29 DIAGNOSIS — Z79899 Other long term (current) drug therapy: Secondary | ICD-10-CM | POA: Insufficient documentation

## 2020-04-29 DIAGNOSIS — Z7983 Long term (current) use of bisphosphonates: Secondary | ICD-10-CM | POA: Insufficient documentation

## 2020-04-29 DIAGNOSIS — Z882 Allergy status to sulfonamides status: Secondary | ICD-10-CM | POA: Insufficient documentation

## 2020-04-29 DIAGNOSIS — Z9884 Bariatric surgery status: Secondary | ICD-10-CM | POA: Diagnosis not present

## 2020-04-29 DIAGNOSIS — Z800 Family history of malignant neoplasm of digestive organs: Secondary | ICD-10-CM | POA: Insufficient documentation

## 2020-04-29 DIAGNOSIS — Z880 Allergy status to penicillin: Secondary | ICD-10-CM | POA: Diagnosis not present

## 2020-04-29 DIAGNOSIS — K635 Polyp of colon: Secondary | ICD-10-CM | POA: Diagnosis not present

## 2020-04-29 HISTORY — PX: POLYPECTOMY: SHX5525

## 2020-04-29 HISTORY — PX: COLONOSCOPY WITH PROPOFOL: SHX5780

## 2020-04-29 LAB — HM COLONOSCOPY

## 2020-04-29 SURGERY — COLONOSCOPY WITH PROPOFOL
Anesthesia: General

## 2020-04-29 MED ORDER — PROPOFOL 10 MG/ML IV BOLUS
INTRAVENOUS | Status: DC | PRN
  Administered 2020-04-29: 100 mg via INTRAVENOUS
  Administered 2020-04-29 (×2): 40 mg via INTRAVENOUS
  Administered 2020-04-29: 50 mg via INTRAVENOUS

## 2020-04-29 MED ORDER — PHENYLEPHRINE 40 MCG/ML (10ML) SYRINGE FOR IV PUSH (FOR BLOOD PRESSURE SUPPORT)
PREFILLED_SYRINGE | INTRAVENOUS | Status: AC
  Filled 2020-04-29: qty 10

## 2020-04-29 MED ORDER — PHENYLEPHRINE 40 MCG/ML (10ML) SYRINGE FOR IV PUSH (FOR BLOOD PRESSURE SUPPORT)
PREFILLED_SYRINGE | INTRAVENOUS | Status: DC | PRN
  Administered 2020-04-29: 80 ug via INTRAVENOUS

## 2020-04-29 MED ORDER — PROPOFOL 500 MG/50ML IV EMUL
INTRAVENOUS | Status: DC | PRN
  Administered 2020-04-29: 150 ug/kg/min via INTRAVENOUS

## 2020-04-29 MED ORDER — LACTATED RINGERS IV SOLN: INTRAVENOUS | Status: DC

## 2020-04-29 MED ORDER — LIDOCAINE HCL (CARDIAC) PF 100 MG/5ML IV SOSY
PREFILLED_SYRINGE | INTRAVENOUS | Status: DC | PRN
  Administered 2020-04-29: 50 mg via INTRAVENOUS

## 2020-04-29 NOTE — Anesthesia Preprocedure Evaluation (Signed)
Anesthesia Evaluation  Patient identified by MRN, date of birth, ID band Patient awake    Reviewed: Allergy & Precautions, H&P , NPO status , Patient's Chart, lab work & pertinent test results, reviewed documented beta blocker date and time   History of Anesthesia Complications (+) PONV and history of anesthetic complications  Airway Mallampati: II  TM Distance: >3 FB Neck ROM: full    Dental no notable dental hx.    Pulmonary pneumonia, resolved,    Pulmonary exam normal breath sounds clear to auscultation       Cardiovascular Exercise Tolerance: Good hypertension, negative cardio ROS   Rhythm:regular Rate:Normal     Neuro/Psych PSYCHIATRIC DISORDERS Anxiety Depression negative neurological ROS     GI/Hepatic Neg liver ROS, hiatal hernia, GERD  Medicated,  Endo/Other  Morbid obesity  Renal/GU negative Renal ROS  negative genitourinary   Musculoskeletal   Abdominal   Peds  Hematology  (+) Blood dyscrasia, anemia ,   Anesthesia Other Findings   Reproductive/Obstetrics negative OB ROS                             Anesthesia Physical  Anesthesia Plan  ASA: III  Anesthesia Plan: General   Post-op Pain Management:    Induction:   PONV Risk Score and Plan: 4 or greater and Propofol infusion  Airway Management Planned:   Additional Equipment:   Intra-op Plan:   Post-operative Plan:   Informed Consent: I have reviewed the patients History and Physical, chart, labs and discussed the procedure including the risks, benefits and alternatives for the proposed anesthesia with the patient or authorized representative who has indicated his/her understanding and acceptance.     Dental Advisory Given  Plan Discussed with: CRNA  Anesthesia Plan Comments:         Anesthesia Quick Evaluation

## 2020-04-29 NOTE — Anesthesia Postprocedure Evaluation (Signed)
Anesthesia Post Note  Patient: [name] [name]  Procedure(s) Performed: COLONOSCOPY WITH PROPOFOL (N/A ) POLYPECTOMY  Patient location during evaluation: PACU Anesthesia Type: General Level of consciousness: awake Pain management: pain level controlled Vital Signs Assessment: post-procedure vital signs reviewed and stable Respiratory status: spontaneous breathing and respiratory function stable Cardiovascular status: blood pressure returned to baseline and stable Postop Assessment: no headache and no apparent nausea or vomiting Anesthetic complications: no   No complications documented.   Last Vitals:  Vitals:   04/29/20 1148 04/29/20 1407  BP: (!) 171/98 (!) 144/78  Pulse: 79 66  Resp: 18 (!) 21  Temp: 36.7 C 36.9 C  SpO2: 96% 100%    Last Pain:  Vitals:   04/29/20 1407  TempSrc: Oral  PainSc: 0-No pain                 Louann Sjogren

## 2020-04-29 NOTE — Op Note (Addendum)
[facility] Patient Name: [name] [name] Procedure Date: 04/29/2020 12:52 PM MRN: [id number] Date of Birth: 1940-10-01 Attending MD: Maylon Peppers ,  CSN: [id number] Age: 80 Admit Type: Outpatient Procedure:                Colonoscopy Indications:               Providers:                Maylon Peppers, Jeanann Lewandowsky. Sharon Seller, RN, Nelma Rothman, Technician Referring MD:              Medicines:                 Complications:            No immediate complications. Estimated Blood Loss:     Estimated blood loss: none. Procedure:                After obtaining informed consent, the colonoscope                            was passed under direct vision. Throughout the                            procedure, the patient's blood pressure, pulse, and                            oxygen saturations were monitored continuously. The                            [id number]-[id number] ([id number]) scope was introduced through                            the anus and advanced to the the cecum, identified                            by appendiceal orifice and ileocecal valve. Scope In: 1:00:06 PM Scope Out: 1:55:58 PM Scope Withdrawal Time: 0 hours 45 minutes 5 seconds  Total Procedure Duration: 0 hours 55 minutes 52 seconds  Findings:      The perianal and digital rectal examinations were normal.      Two sessile polyps were found in the cecum. The polyps were 3 to 5 mm in       size. These polyps were removed with a cold snare. Resection and       retrieval were complete.      Three sessile polyps were found in the ascending colon. The polyps were       3 to 6 mm in size. These polyps were removed with a cold snare.       Resection and retrieval were complete.      Four sessile polyps were found in the transverse colon. The polyps were       4 to 8 mm in size. These polyps were removed with a cold snare.       Resection and retrieval were complete.      Seven sessile polyps were found in  the descending colon. The  polyps were       3 to 9 mm in size. These polyps were removed with a cold snare.       Resection and retrieval were complete. For hemostasis, one hemostatic       clip was successfully placed in one of the polypectomy sites. There was       no bleeding at the end of the procedure.      Multiple small and large-mouthed diverticula were found in the sigmoid       colon and descending colon.      The retroflexed view of the distal rectum and anal verge was normal and       showed no anal or rectal abnormalities. Impression:               - Two 3 to 5 mm polyps in the cecum, removed with a                            cold snare. Resected and retrieved.                           - Three 3 to 6 mm polyps in the ascending colon,                            removed with a cold snare. Resected and retrieved.                           - Four 4 to 8 mm polyps in the transverse colon,                            removed with a cold snare. Resected and retrieved.                           - Seven 3 to 9 mm polyps in the descending colon,                            removed with a cold snare. Resected and retrieved.                            Clip was placed.                           - Diverticulosis in the sigmoid colon and in the                            descending colon.                           - The distal rectum and anal verge are normal on                            retroflexion view. Moderate Sedation:      Per Anesthesia Care Recommendation:           - Discharge patient to home (ambulatory).                           -  Resume previous diet.                           - Await pathology results.                           - Repeat colonoscopy is not recommended due to                            current age (59 years or older) for screening                            purposes. Procedure Code(s):        --- Professional ---                           [id number], Colonoscopy,  flexible; with removal of                            tumor(s), polyp(s), or other lesion(s) by snare                            technique Diagnosis Code(s):        --- Professional ---                           K63.5, Polyp of colon                           K57.30, Diverticulosis of large intestine without                            perforation or abscess without bleeding CPT copyright 2019 American Medical Association. All rights reserved. The codes documented in this report are preliminary and upon coder review may  be revised to meet current compliance requirements. Maylon Peppers, MD Maylon Peppers,  04/29/2020 2:09:09 PM This report has been signed electronically. Number of Addenda: 0

## 2020-04-29 NOTE — Discharge Instructions (Signed)
You are being discharged to home.  Resume your previous diet.  We are waiting for your pathology results.  Your physician has indicated that a repeat colonoscopy is not recommended due to your current age (78 years or older) for screening purposes.              Colonoscopy, Adult, Care After This sheet gives you information about how to care for yourself after your procedure. Your doctor may also give you more specific instructions. If you have problems or questions, call your doctor. What can I expect after the procedure? After the procedure, it is common to have:  A small amount of blood in your poop (stool) for 24 hours.  Some gas.  Mild cramping or bloating in your belly (abdomen). Follow these instructions at home: Eating and drinking  Drink enough fluid to keep your pee (urine) pale yellow.  Follow instructions from your doctor about what you cannot eat or drink.  Return to your normal diet as told by your doctor. Avoid heavy or fried foods that are hard to digest.   Activity  Rest as told by your doctor.  Do not sit for a long time without moving. Get up to take short walks every 1-2 hours. This is important. Ask for help if you feel weak or unsteady.  Return to your normal activities as told by your doctor. Ask your doctor what activities are safe for you. To help cramping and bloating:  Try walking around.  Put heat on your belly as told by your doctor. Use the heat source that your doctor recommends, such as a moist heat pack or a heating pad. ? Put a towel between your skin and the heat source. ? Leave the heat on for 20-30 minutes. ? Remove the heat if your skin turns bright red. This is very important if you are unable to feel pain, heat, or cold. You may have a greater risk of getting burned.   General instructions  If you were given a medicine to help you relax (sedative) during your procedure, it can affect you for many hours. Do not drive or use  machinery until your doctor says that it is safe.  For the first 24 hours after the procedure: ? Do not sign important documents. ? Do not drink alcohol. ? Do your daily activities more slowly than normal. ? Eat foods that are soft and easy to digest.  Take over-the-counter or prescription medicines only as told by your doctor.  Keep all follow-up visits as told by your doctor. This is important. Contact a doctor if:  You have blood in your poop 2-3 days after the procedure. Get help right away if:  You have more than a small amount of blood in your poop.  You see large clumps of tissue (blood clots) in your poop.  Your belly is swollen.  You feel like you may vomit (nauseous).  You vomit.  You have a fever.  You have belly pain that gets worse, and medicine does not help your pain. Summary  After the procedure, it is common to have a small amount of blood in your poop. You may also have mild cramping and bloating in your belly.  If you were given a medicine to help you relax (sedative) during your procedure, it can affect you for many hours. Do not drive or use machinery until your doctor says that it is safe.  Get help right away if you have a lot of blood  in your poop, feel like you may vomit, have a fever, or have more belly pain. This information is not intended to replace advice given to you by your health care provider. Make sure you discuss any questions you have with your health care provider. Document Revised: 12/26/2018 Document Reviewed: 09/15/2018 Elsevier Patient Education  2021 [place].     Diverticulosis  Diverticulosis is a condition that develops when small pouches (diverticula) form in the wall of the large intestine (colon). The colon is where water is absorbed and stool (feces) is formed. The pouches form when the inside layer of the colon pushes through weak spots in the outer layers of the colon. You may have a few pouches or many of  them. The pouches usually do not cause problems unless they become inflamed or infected. When this happens, the condition is called diverticulitis. What are the causes? The cause of this condition is not known. What increases the risk? The following factors may make you more likely to develop this condition:  Being older than age 80. Your risk for this condition increases with age. Diverticulosis is rare among people younger than age 73. By age 24, many people have it.  Eating a low-fiber diet.  Having frequent constipation.  Being overweight.  Not getting enough exercise.  Smoking.  Taking over-the-counter pain medicines, like aspirin and ibuprofen.  Having a family history of diverticulosis. What are the signs or symptoms? In most people, there are no symptoms of this condition. If you do have symptoms, they may include:  Bloating.  Cramps in the abdomen.  Constipation or diarrhea.  Pain in the lower left side of the abdomen. How is this diagnosed? Because diverticulosis usually has no symptoms, it is most often diagnosed during an exam for other colon problems. The condition may be diagnosed by:  Using a flexible scope to examine the colon (colonoscopy).  Taking an X-ray of the colon after dye has been put into the colon (barium enema).  Having a CT scan. How is this treated? You may not need treatment for this condition. Your health care provider may recommend treatment to prevent problems. You may need treatment if you have symptoms or if you previously had diverticulitis. Treatment may include:  Eating a high-fiber diet.  Taking a fiber supplement.  Taking a live bacteria supplement (probiotic).  Taking medicine to relax your colon.   Follow these instructions at home: Medicines  Take over-the-counter and prescription medicines only as told by your health care provider.  If told by your health care provider, take a fiber supplement or  probiotic. Constipation prevention Your condition may cause constipation. To prevent or treat constipation, you may need to:  Drink enough fluid to keep your urine pale yellow.  Take over-the-counter or prescription medicines.  Eat foods that are high in fiber, such as beans, whole grains, and fresh fruits and vegetables.  Limit foods that are high in fat and processed sugars, such as fried or sweet foods.   General instructions  Try not to strain when you have a bowel movement.  Keep all follow-up visits as told by your health care provider. This is important. Contact a health care provider if you:  Have pain in your abdomen.  Have bloating.  Have cramps.  Have not had a bowel movement in 3 days. Get help right away if:  Your pain gets worse.  Your bloating becomes very bad.  You have a fever or chills, and your symptoms suddenly get  worse.  You vomit.  You have bowel movements that are bloody or black.  You have bleeding from your rectum. Summary  Diverticulosis is a condition that develops when small pouches (diverticula) form in the wall of the large intestine (colon).  You may have a few pouches or many of them.  This condition is most often diagnosed during an exam for other colon problems.  Treatment may include increasing the fiber in your diet, taking supplements, or taking medicines. This information is not intended to replace advice given to you by your health care provider. Make sure you discuss any questions you have with your health care provider. Document Revised: 09/18/2018 Document Reviewed: 09/18/2018 Elsevier Patient Education  2021 [place] After This sheet gives you information about how to care for yourself after your procedure. Your health care provider may also give you more specific instructions. If you have problems or questions, contact your health care provider. What can I expect after the  procedure? After the procedure, it is common to have:  Tiredness.  Forgetfulness about what happened after the procedure.  Impaired judgment for important decisions.  Nausea or vomiting.  Some difficulty with balance. Follow these instructions at home: For the time period you were told by your health care provider:  Rest as needed.  Do not participate in activities where you could fall or become injured.  Do not drive or use machinery.  Do not drink alcohol.  Do not take sleeping pills or medicines that cause drowsiness.  Do not make important decisions or sign legal documents.  Do not take care of children on your own.      Eating and drinking  Follow the diet that is recommended by your health care provider.  Drink enough fluid to keep your urine pale yellow.  If you vomit: ? Drink water, juice, or soup when you can drink without vomiting. ? Make sure you have little or no nausea before eating solid foods. General instructions  Have a responsible adult stay with you for the time you are told. It is important to have someone help care for you until you are awake and alert.  Take over-the-counter and prescription medicines only as told by your health care provider.  If you have sleep apnea, surgery and certain medicines can increase your risk for breathing problems. Follow instructions from your health care provider about wearing your sleep device: ? Anytime you are sleeping, including during daytime naps. ? While taking prescription pain medicines, sleeping medicines, or medicines that make you drowsy.  Avoid smoking.  Keep all follow-up visits as told by your health care provider. This is important. Contact a health care provider if:  You keep feeling nauseous or you keep vomiting.  You feel light-headed.  You are still sleepy or having trouble with balance after 24 hours.  You develop a rash.  You have a fever.  You have redness or swelling around  the IV site. Get help right away if:  You have trouble breathing.  You have new-onset confusion at home. Summary  For several hours after your procedure, you may feel tired. You may also be forgetful and have poor judgment.  Have a responsible adult stay with you for the time you are told. It is important to have someone help care for you until you are awake and alert.  Rest as told. Do not drive or operate machinery. Do not drink alcohol or take sleeping  pills.  Get help right away if you have trouble breathing, or if you suddenly become confused. This information is not intended to replace advice given to you by your health care provider. Make sure you discuss any questions you have with your health care provider. Document Revised: 11/05/2019 Document Reviewed: 01/22/2019 Elsevier Patient Education  2021 [employer].

## 2020-04-29 NOTE — Interval H&P Note (Signed)
History and Physical Interval Note:  04/29/2020 12:11 PM [name] [name] is a 80 y.o. female with past medical history of hypertension, GERD, depression, iron deficiency anemia, IBS-C, Barrett's esophagus without dysplasia and anxiety, who presents for colorectal cancer screening.  Her last colonoscopy was performed in 2019, the patient was found to have 10 polyps which were all removed and consistent with a tubular adenoma.  The largest polyp had 12 mm in size.  The patient denies having any complaints such as melena, hematochezia, abdominal pain or distention, change in her bowel movement consistency or frequency, no changes in her weight recently.  Her brother had a history of colon cancer when he was in his 56s.  BP (!) 171/98   Pulse 79   Temp 98 F (36.7 C) (Oral)   Resp 18   Ht 5\' 3"  (1.6 m)   Wt 102.5 kg   SpO2 96%   BMI 40.03 kg/m  GENERAL: The patient is AO x3, in no acute distress. HEENT: Head is normocephalic and atraumatic. EOMI are intact. Mouth is well hydrated and without lesions. NECK: Supple. No masses LUNGS: Clear to auscultation. No presence of rhonchi/wheezing/rales. Adequate chest expansion HEART: RRR, normal s1 and s2. ABDOMEN: Soft, nontender, no guarding, no peritoneal signs, and nondistended. BS +. No masses. EXTREMITIES: Without any cyanosis, clubbing, rash, lesions or edema. NEUROLOGIC: AOx3, no focal motor deficit. SKIN: no jaundice, no rashes    [name] [name]  has presented today for surgery, with the diagnosis of colonic polyps.  The various methods of treatment have been discussed with the patient and family. After consideration of risks, benefits and other options for treatment, the patient has consented to  Procedure(s) with comments: COLONOSCOPY WITH PROPOFOL (N/A) - 12:45 as a surgical intervention.  The patient's history has been reviewed, patient examined, no change in status, stable for surgery.  I have reviewed the patient's chart and labs.   Questions were answered to the patient's satisfaction.     Maylon Peppers Mayorga

## 2020-04-29 NOTE — Anesthesia Procedure Notes (Signed)
Date/Time: 04/29/2020 1:04 PM Performed by: Orlie Dakin, CRNA Pre-anesthesia Checklist: Patient identified, Emergency Drugs available, Suction available and Patient being monitored Patient Re-evaluated:Patient Re-evaluated prior to induction Oxygen Delivery Method: Nasal cannula Induction Type: IV induction Placement Confirmation: positive ETCO2

## 2020-04-29 NOTE — Transfer of Care (Signed)
Immediate Anesthesia Transfer of Care Note  Patient: [name] [name]  Procedure(s) Performed: COLONOSCOPY WITH PROPOFOL (N/A ) POLYPECTOMY  Patient Location: Short Stay  Anesthesia Type:General  Level of Consciousness: awake, alert  and oriented  Airway & Oxygen Therapy: Patient Spontanous Breathing  Post-op Assessment: Report given to RN, Post -op Vital signs reviewed and stable and Patient moving all extremities X 4  Post vital signs: Reviewed and stable  Last Vitals:  Vitals Value Taken Time  BP    Temp    Pulse    Resp    SpO2      Last Pain:  Vitals:   04/29/20 1148  TempSrc: Oral  PainSc: 3       Patients Stated Pain Goal: 6 (84/53/64 [id number])  Complications: No complications documented.

## 2020-05-02 ENCOUNTER — Encounter (INDEPENDENT_AMBULATORY_CARE_PROVIDER_SITE_OTHER): Payer: Self-pay | Admitting: *Deleted

## 2020-05-03 ENCOUNTER — Encounter (INDEPENDENT_AMBULATORY_CARE_PROVIDER_SITE_OTHER): Payer: Self-pay | Admitting: *Deleted

## 2020-05-03 LAB — SURGICAL PATHOLOGY

## 2020-05-05 ENCOUNTER — Encounter (HOSPITAL_COMMUNITY): Payer: Self-pay | Admitting: Gastroenterology

## 2020-05-05 DIAGNOSIS — R5382 Chronic fatigue, unspecified: Secondary | ICD-10-CM | POA: Diagnosis not present

## 2020-05-05 DIAGNOSIS — Z1329 Encounter for screening for other suspected endocrine disorder: Secondary | ICD-10-CM | POA: Diagnosis not present

## 2020-05-05 DIAGNOSIS — K219 Gastro-esophageal reflux disease without esophagitis: Secondary | ICD-10-CM | POA: Diagnosis not present

## 2020-05-05 DIAGNOSIS — E876 Hypokalemia: Secondary | ICD-10-CM | POA: Diagnosis not present

## 2020-05-05 DIAGNOSIS — N189 Chronic kidney disease, unspecified: Secondary | ICD-10-CM | POA: Diagnosis not present

## 2020-05-09 ENCOUNTER — Ambulatory Visit (HOSPITAL_COMMUNITY)
Admission: RE | Admit: 2020-05-09 | Discharge: 2020-05-09 | Disposition: A | Discharge status: Home / Self Care | Payer: Medicare PPO | Source: Ambulatory Visit | Attending: Gastroenterology | Admitting: Gastroenterology

## 2020-05-09 ENCOUNTER — Other Ambulatory Visit: Payer: Self-pay

## 2020-05-09 ENCOUNTER — Encounter (HOSPITAL_COMMUNITY): Payer: Self-pay | Admitting: Radiology

## 2020-05-09 DIAGNOSIS — K449 Diaphragmatic hernia without obstruction or gangrene: Secondary | ICD-10-CM | POA: Diagnosis not present

## 2020-05-09 DIAGNOSIS — K429 Umbilical hernia without obstruction or gangrene: Secondary | ICD-10-CM | POA: Diagnosis not present

## 2020-05-09 DIAGNOSIS — K862 Cyst of pancreas: Secondary | ICD-10-CM | POA: Insufficient documentation

## 2020-05-09 DIAGNOSIS — K439 Ventral hernia without obstruction or gangrene: Secondary | ICD-10-CM | POA: Diagnosis not present

## 2020-05-09 MED ORDER — IOHEXOL 300 MG/ML  SOLN
100.0000 mL | Freq: Once | INTRAMUSCULAR | Status: AC | PRN
  Administered 2020-05-09: 100 mL via INTRAVENOUS

## 2020-05-10 DIAGNOSIS — I5032 Chronic diastolic (congestive) heart failure: Secondary | ICD-10-CM | POA: Diagnosis not present

## 2020-05-10 DIAGNOSIS — Z0001 Encounter for general adult medical examination with abnormal findings: Secondary | ICD-10-CM | POA: Diagnosis not present

## 2020-05-10 DIAGNOSIS — N362 Urethral caruncle: Secondary | ICD-10-CM | POA: Diagnosis not present

## 2020-05-10 DIAGNOSIS — N182 Chronic kidney disease, stage 2 (mild): Secondary | ICD-10-CM | POA: Diagnosis not present

## 2020-05-10 DIAGNOSIS — F339 Major depressive disorder, recurrent, unspecified: Secondary | ICD-10-CM | POA: Diagnosis not present

## 2020-05-10 DIAGNOSIS — Z981 Arthrodesis status: Secondary | ICD-10-CM | POA: Diagnosis not present

## 2020-05-10 DIAGNOSIS — I10 Essential (primary) hypertension: Secondary | ICD-10-CM | POA: Diagnosis not present

## 2020-05-10 DIAGNOSIS — M5136 Other intervertebral disc degeneration, lumbar region: Secondary | ICD-10-CM | POA: Diagnosis not present

## 2020-05-17 DIAGNOSIS — R519 Headache, unspecified: Secondary | ICD-10-CM | POA: Diagnosis not present

## 2020-05-24 ENCOUNTER — Other Ambulatory Visit (HOSPITAL_COMMUNITY): Payer: Self-pay | Admitting: Neurosurgery

## 2020-05-24 ENCOUNTER — Other Ambulatory Visit: Payer: Self-pay | Admitting: Neurosurgery

## 2020-05-24 DIAGNOSIS — R519 Headache, unspecified: Secondary | ICD-10-CM

## 2020-05-31 DIAGNOSIS — L570 Actinic keratosis: Secondary | ICD-10-CM | POA: Diagnosis not present

## 2020-06-08 ENCOUNTER — Other Ambulatory Visit: Payer: Self-pay

## 2020-06-08 ENCOUNTER — Ambulatory Visit (HOSPITAL_COMMUNITY)
Admission: RE | Admit: 2020-06-08 | Discharge: 2020-06-08 | Disposition: A | Discharge status: Home / Self Care | Payer: Medicare PPO | Source: Ambulatory Visit | Attending: Neurosurgery | Admitting: Neurosurgery

## 2020-06-08 DIAGNOSIS — R519 Headache, unspecified: Secondary | ICD-10-CM | POA: Diagnosis not present

## 2020-06-29 DIAGNOSIS — R519 Headache, unspecified: Secondary | ICD-10-CM | POA: Diagnosis not present

## 2020-07-02 DIAGNOSIS — I5032 Chronic diastolic (congestive) heart failure: Secondary | ICD-10-CM | POA: Diagnosis not present

## 2020-07-02 DIAGNOSIS — I130 Hypertensive heart and chronic kidney disease with heart failure and stage 1 through stage 4 chronic kidney disease, or unspecified chronic kidney disease: Secondary | ICD-10-CM | POA: Diagnosis not present

## 2020-07-02 DIAGNOSIS — N182 Chronic kidney disease, stage 2 (mild): Secondary | ICD-10-CM | POA: Diagnosis not present

## 2020-08-01 DIAGNOSIS — I130 Hypertensive heart and chronic kidney disease with heart failure and stage 1 through stage 4 chronic kidney disease, or unspecified chronic kidney disease: Secondary | ICD-10-CM | POA: Diagnosis not present

## 2020-08-01 DIAGNOSIS — I5032 Chronic diastolic (congestive) heart failure: Secondary | ICD-10-CM | POA: Diagnosis not present

## 2020-08-01 DIAGNOSIS — N182 Chronic kidney disease, stage 2 (mild): Secondary | ICD-10-CM | POA: Diagnosis not present

## 2020-08-04 DIAGNOSIS — R5382 Chronic fatigue, unspecified: Secondary | ICD-10-CM | POA: Diagnosis not present

## 2020-08-04 DIAGNOSIS — N182 Chronic kidney disease, stage 2 (mild): Secondary | ICD-10-CM | POA: Diagnosis not present

## 2020-08-08 DIAGNOSIS — D696 Thrombocytopenia, unspecified: Secondary | ICD-10-CM | POA: Diagnosis not present

## 2020-08-08 DIAGNOSIS — R011 Cardiac murmur, unspecified: Secondary | ICD-10-CM | POA: Diagnosis not present

## 2020-08-08 DIAGNOSIS — N182 Chronic kidney disease, stage 2 (mild): Secondary | ICD-10-CM | POA: Diagnosis not present

## 2020-08-08 DIAGNOSIS — I10 Essential (primary) hypertension: Secondary | ICD-10-CM | POA: Diagnosis not present

## 2020-08-08 DIAGNOSIS — Z981 Arthrodesis status: Secondary | ICD-10-CM | POA: Diagnosis not present

## 2020-08-08 DIAGNOSIS — I5032 Chronic diastolic (congestive) heart failure: Secondary | ICD-10-CM | POA: Diagnosis not present

## 2020-08-08 DIAGNOSIS — E876 Hypokalemia: Secondary | ICD-10-CM | POA: Diagnosis not present

## 2020-08-08 DIAGNOSIS — M159 Polyosteoarthritis, unspecified: Secondary | ICD-10-CM | POA: Diagnosis not present

## 2020-08-25 ENCOUNTER — Ambulatory Visit: Payer: Self-pay

## 2020-08-25 ENCOUNTER — Ambulatory Visit (INDEPENDENT_AMBULATORY_CARE_PROVIDER_SITE_OTHER): Payer: Medicare PPO

## 2020-08-25 ENCOUNTER — Encounter: Payer: Self-pay | Admitting: Orthopaedic Surgery

## 2020-08-25 ENCOUNTER — Ambulatory Visit (INDEPENDENT_AMBULATORY_CARE_PROVIDER_SITE_OTHER): Payer: Medicare PPO | Admitting: Orthopaedic Surgery

## 2020-08-25 VITALS — Ht 63.0 in | Wt 216.0 lb

## 2020-08-25 DIAGNOSIS — M7051 Other bursitis of knee, right knee: Secondary | ICD-10-CM

## 2020-08-25 DIAGNOSIS — M25562 Pain in left knee: Secondary | ICD-10-CM

## 2020-08-25 DIAGNOSIS — G8929 Other chronic pain: Secondary | ICD-10-CM

## 2020-08-25 DIAGNOSIS — M25561 Pain in right knee: Secondary | ICD-10-CM | POA: Diagnosis not present

## 2020-08-25 NOTE — Progress Notes (Signed)
Office Visit Note   Patient: [name] [name]           Date of Birth: 05-Nov-1940           MRN: [id number] Visit Date: 08/25/2020              Requested by: Curlene Labrum, MD [place],  [place] [id number] PCP: Curlene Labrum, MD   Assessment & Plan: Visit Diagnoses:  1. Chronic pain of both knees   2. Pes anserinus bursitis of right knee     Plan: Right knee injected with injection in the pes bursa.  Reproduction of her pain improvement postinjection.  She will follow-up if she has persistent problems.  X-ray results were reviewed which showed no loosening or subsidence.  Follow-Up Instructions: No follow-ups on file.   Orders:  Orders Placed This Encounter  Procedures   XR Knee 1-2 Views Right   XR Knee 1-2 Views Left   No orders of the defined types were placed in this encounter.     Procedures: Large Joint Inj: R knee on 08/29/2020 10:47 AM Indications: pain and joint swelling Details: 22 G 1.5 in needle, anteromedial approach  Arthrogram: No  Medications: 40 mg methylPREDNISolone acetate 40 MG/ML; 0.5 mL lidocaine 1 %; 2 mL bupivacaine 0.25 % Outcome: tolerated well, no immediate complications Procedure, treatment alternatives, risks and benefits explained, specific risks discussed. Consent was given by the patient. Immediately prior to procedure a time out was called to verify the correct patient, procedure, equipment, support staff and site/side marked as required. Patient was prepped and draped in the usual sterile fashion.      Clinical Data: No additional findings.   Subjective: Chief Complaint  Patient presents with   Right Knee - Pain   Left Knee - Pain    HPI 80 year old female with bilateral knee pain much worse on the right than left she points over the medial aspect where she is having pain.  Previous right total knee arthroplasty 2020.    Review of Systems 14 point review of systems updated unchanged from previous visit.  Negative  for fever chills no falling.  Past history of venous stasis problems with superficial varicosities no recent acute ulceration.  Positive for left shoulder osteoarthritis.   Objective: Vital Signs: Ht 5\' 3"  (1.6 m)   Wt 216 lb (98 kg)   BMI 38.26 kg/m   Physical Exam Constitutional:      Appearance: She is well-developed.  HENT:     Head: Normocephalic.     Right Ear: External ear normal.     Left Ear: External ear normal. There is no impacted cerumen.  Eyes:     Pupils: Pupils are equal, round, and reactive to light.  Neck:     Thyroid: No thyromegaly.     Trachea: No tracheal deviation.  Cardiovascular:     Rate and Rhythm: Normal rate.  Pulmonary:     Effort: Pulmonary effort is normal.  Abdominal:     Palpations: Abdomen is soft.  Musculoskeletal:     Cervical back: No rigidity.  Skin:    General: Skin is warm and dry.  Neurological:     Mental Status: She is alert and oriented to person, place, and time.  Psychiatric:        Behavior: Behavior normal.    Ortho Exam well-healed bilateral midline total knee arthroplasty incisions.  She has good tenderness over the pes bursa.  Bilateral quad mild weakness.  Knees reach full extension negative logroll the hips.  No tenderness in the popliteal space.  Medial hamstrings are normal to palpation.  No knee effusion right or left knee.   Specialty Comments:  No specialty comments available.  Imaging: No results found.   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   History of arthroplasty of right shoulder 08/14/2018   History of colonic polyps 08/14/2017   Choledocholithiasis    Abnormal  CT scan, esophagus    Cholestasis 02/05/2017   Depression 02/05/2017   Hypomagnesemia 02/05/2017   Bradycardia 02/05/2017   Transaminitis 07/08/2016   Constipation 07/08/2016   GIB (gastrointestinal bleeding) 07/08/2016   HCAP (healthcare-associated pneumonia) 09/23/2015   AKI (acute kidney injury) ([place]) 07/21/2015   Hypokalemia 07/21/2015   CAP (community acquired pneumonia) 07/14/2015   Upper abdominal pain 38/18/2993   Folliculitis of perineum 02/24/2014   Giant comedone 12/21/2013   Sebaceous gland hyperplasia of vulva 11/25/2013   Acrochordon 11/25/2013   Dysphagia 10/29/2013   Class 2 severe obesity due to excess calories with serious comorbidity and body mass index (BMI) of 38.0 to 38.9 in adult ([place]) 10/02/2013   Primary hypertension    GERD (gastroesophageal reflux disease)    Anxiety    Anemia of chronic disease    Past Medical History:  Diagnosis Date   Anemia    Anxiety    Arthritis    Barrett's esophagus    seen on 2016 egd at [facility]   Depression    GERD (gastroesophageal reflux disease)    H/O hiatal hernia    Hypertension    Obesity    Pneumonia    PONV (postoperative nausea and vomiting)    Seasonal allergies    Sepsis ([place]) 10/02/2013    Family History  Problem Relation Age of Onset   Colon cancer Brother        IN HIS 60s-METASTATIC   Colon polyps Paternal Grandfather    Breast cancer Mother    Hypertension Father    Heart disease Father    Hypertension Brother    Heart disease Brother     Past Surgical History:  Procedure Laterality Date   ABDOMINAL HYSTERECTOMY     ABDOMINAL SURGERY     APPENDECTOMY     BACK SURGERY     lumbar x2 by Dr Arnoldo Morale   BIOPSY  09/03/2019   Procedure: BIOPSY;  Surgeon: Rogene Houston, MD;  [facility];  Service: Endoscopy;;  esophagus   CARPAL TUNNEL RELEASE Right 03/2014   Dr. Lorin Mercy   CATARACT EXTRACTION W/PHACO Left 08/06/2013   Procedure: CATARACT EXTRACTION PHACO AND INTRAOCULAR LENS PLACEMENT  LEFT EYE;  Surgeon: Tonny Branch, MD;  Location: [facility];  Service: Ophthalmology;  Laterality: Left;  CDE: 9.55   CATARACT EXTRACTION W/PHACO Right 08/24/2013   Procedure: CATARACT EXTRACTION PHACO AND INTRAOCULAR LENS PLACEMENT (IOC);  Surgeon: Tonny Branch, MD;  Location: [facility];  Service: Ophthalmology;  Laterality: Right;  CDE:8.30   CERVICAL FUSION     CHOLECYSTECTOMY     COLONOSCOPY N/A 11/03/2013   SLF: 1. Normal mucosa in the terminal ileum. 2. 13 colon polyps removed. 3. Moderate diverticulosis in the descending colon  and sigmoid colon 4. the left colon is redundant 5. Small internal  hemorrhoids.   COLONOSCOPY N/A 09/25/2017   Procedure: COLONOSCOPY;  Surgeon: Rogene Houston, MD;  [facility];  Service: Endoscopy;  Laterality: N/A;  1:55   COLONOSCOPY WITH PROPOFOL N/A 04/29/2020   Procedure: COLONOSCOPY WITH PROPOFOL;  Surgeon: Harvel Quale, MD;  [facility];  Service: Gastroenterology;  Laterality: N/A;  12:45   ESOPHAGOGASTRODUODENOSCOPY N/A 11/03/2013   SLF: 1. 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 Mild Non-erosive gastritis   ESOPHAGOGASTRODUODENOSCOPY  10/2014   [facility]: IMPRESSIONS: - likely small distal esophageal diverticulum without evidence of fistula, +barrett's esophagus without dysplasia. s/p gastric bypass   ESOPHAGOGASTRODUODENOSCOPY (EGD) WITH PROPOFOL N/A 09/03/2019   Procedure: ESOPHAGOGASTRODUODENOSCOPY (EGD) WITH PROPOFOL;  Surgeon: Rogene Houston, MD;  [facility];  Service: Endoscopy;  Laterality: N/A;  205   EYE SURGERY     GASTRIC BYPASS  1979   revision in [place] in 2011   JOINT REPLACEMENT Right 1995   hip   JOINT REPLACEMENT Left 2008   hip   MEDIAL PARTIAL KNEE REPLACEMENT Left    Dr Ronnie Derby   PARAESOPHAGEAL HERNIA REPAIR  SEP 2010 DR. MCNATT   POLYPECTOMY  09/25/2017   Procedure: POLYPECTOMY;  Surgeon: Rogene Houston,  MD;  [facility];  Service: Endoscopy;;  colon   POLYPECTOMY  04/29/2020   Procedure: POLYPECTOMY;  Surgeon: Harvel Quale, MD;  [facility];  Service: Gastroenterology;;   SHOULDER ARTHROSCOPY Right    SHOULDER ARTHROSCOPY Right    x2   SKIN BIOPSY     TONSILLECTOMY     TOTAL KNEE ARTHROPLASTY Right 09/08/2018   Procedure: RIGHT TOTAL KNEE ARTHROPLASTY;  Surgeon: Marybelle Killings, MD;  Location: [place];  Service: Orthopedics;  Laterality: Right;   TOTAL SHOULDER ARTHROPLASTY Right 02/16/2013   Procedure: TOTAL SHOULDER ARTHROPLASTY;  Surgeon: Marybelle Killings, MD;  Location: [place];  Service: Orthopedics;  Laterality: Right;  Right Total Shoulder Arthroplasty, Cemented   Social History   Occupational History   Not on file  Tobacco Use   Smoking status: Never   Smokeless tobacco: Never  Vaping Use   Vaping Use: Never used  Substance and Sexual Activity   Alcohol use: No   Drug use: No   Sexual activity: Yes    Birth control/protection: Surgical

## 2020-08-27 ENCOUNTER — Other Ambulatory Visit: Payer: Self-pay

## 2020-08-27 ENCOUNTER — Emergency Department (HOSPITAL_COMMUNITY)
Admission: EM | Admit: 2020-08-27 | Discharge: 2020-08-27 | Disposition: A | Discharge status: Home / Self Care | Payer: Medicare PPO | Attending: Emergency Medicine | Admitting: Emergency Medicine

## 2020-08-27 DIAGNOSIS — M25561 Pain in right knee: Secondary | ICD-10-CM | POA: Diagnosis not present

## 2020-08-27 DIAGNOSIS — Z91040 Latex allergy status: Secondary | ICD-10-CM | POA: Insufficient documentation

## 2020-08-27 DIAGNOSIS — S8991XA Unspecified injury of right lower leg, initial encounter: Secondary | ICD-10-CM | POA: Diagnosis present

## 2020-08-27 DIAGNOSIS — S8001XA Contusion of right knee, initial encounter: Secondary | ICD-10-CM | POA: Diagnosis not present

## 2020-08-27 DIAGNOSIS — Z96653 Presence of artificial knee joint, bilateral: Secondary | ICD-10-CM | POA: Diagnosis not present

## 2020-08-27 DIAGNOSIS — X58XXXA Exposure to other specified factors, initial encounter: Secondary | ICD-10-CM | POA: Insufficient documentation

## 2020-08-27 DIAGNOSIS — Z96611 Presence of right artificial shoulder joint: Secondary | ICD-10-CM | POA: Insufficient documentation

## 2020-08-27 DIAGNOSIS — I10 Essential (primary) hypertension: Secondary | ICD-10-CM | POA: Diagnosis not present

## 2020-08-27 DIAGNOSIS — Z96643 Presence of artificial hip joint, bilateral: Secondary | ICD-10-CM | POA: Diagnosis not present

## 2020-08-27 MED ORDER — HYDROCODONE-ACETAMINOPHEN 5-325 MG PO TABS: 1.0000 | ORAL_TABLET | Freq: Four times a day (QID) | ORAL | 0 refills | Status: DC | PRN

## 2020-08-27 MED ORDER — HYDROCODONE-ACETAMINOPHEN 5-325 MG PO TABS
1.0000 | ORAL_TABLET | Freq: Once | ORAL | Status: AC
  Administered 2020-08-27: 1 via ORAL
  Filled 2020-08-27: qty 1

## 2020-08-27 NOTE — ED Provider Notes (Signed)
[facility] EMERGENCY DEPARTMENT Provider Note  CSN: [id number] Arrival date & time: 08/27/20 3536    History Chief Complaint  Patient presents with   Knee Pain     Knee Pain  [name] [name] is a 80 y.o. female with a history of bilateral knee replacements continues to have chronic knee pain. She was seen in Ortho clinic two days ago and had a steroid injection in her R knee. She has had increasing pain since then. Worse with movement. No fevers or swelling. She has been able to walk gingerly with her walker.    Past Medical History:  Diagnosis Date   Anemia    Anxiety    Arthritis    Barrett's esophagus    seen on 2016 egd at [facility]   Depression    GERD (gastroesophageal reflux disease)    H/O hiatal hernia    Hypertension    Obesity    Pneumonia    PONV (postoperative nausea and vomiting)    Seasonal allergies    Sepsis ([place]) 10/02/2013    Past Surgical History:  Procedure Laterality Date   ABDOMINAL HYSTERECTOMY     ABDOMINAL SURGERY     APPENDECTOMY     BACK SURGERY     lumbar x2 by Dr Arnoldo Morale   BIOPSY  09/03/2019   Procedure: BIOPSY;  Surgeon: Rogene Houston, MD;  [facility];  Service: Endoscopy;;  esophagus   CARPAL TUNNEL RELEASE Right 03/2014   Dr. Lorin Mercy   CATARACT EXTRACTION W/PHACO Left 08/06/2013   Procedure: CATARACT EXTRACTION PHACO AND INTRAOCULAR LENS PLACEMENT LEFT EYE;  Surgeon: Tonny Branch, MD;  [facility];  Service: Ophthalmology;  Laterality: Left;  CDE: 9.55   CATARACT EXTRACTION W/PHACO Right 08/24/2013   Procedure: CATARACT EXTRACTION PHACO AND INTRAOCULAR LENS PLACEMENT (IOC);  Surgeon: Tonny Branch, MD;  [facility];  Service: Ophthalmology;  Laterality: Right;  CDE:8.30   CERVICAL FUSION     CHOLECYSTECTOMY     COLONOSCOPY N/A 11/03/2013   SLF: 1. Normal mucosa in the terminal ileum. 2. 13 colon polyps removed. 3. Moderate diverticulosis in the descending colon and sigmoid colon 4. the left colon is redundant 5. Small  internal  hemorrhoids.   COLONOSCOPY N/A 09/25/2017   Procedure: COLONOSCOPY;  Surgeon: Rogene Houston, MD;  [facility];  Service: Endoscopy;  Laterality: N/A;  1:55   COLONOSCOPY WITH PROPOFOL N/A 04/29/2020   Procedure: COLONOSCOPY WITH PROPOFOL;  Surgeon: Harvel Quale, MD;  [facility];  Service: Gastroenterology;  Laterality: N/A;  12:45   ESOPHAGOGASTRODUODENOSCOPY N/A 11/03/2013   SLF: 1. 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 Mild Non-erosive gastritis   ESOPHAGOGASTRODUODENOSCOPY  10/2014   [facility]: IMPRESSIONS: - likely small distal esophageal diverticulum without evidence of fistula, +barrett's esophagus without dysplasia. s/p gastric bypass   ESOPHAGOGASTRODUODENOSCOPY (EGD) WITH PROPOFOL N/A 09/03/2019   Procedure: ESOPHAGOGASTRODUODENOSCOPY (EGD) WITH PROPOFOL;  Surgeon: Rogene Houston, MD;  [facility];  Service: Endoscopy;  Laterality: N/A;  205   EYE SURGERY     GASTRIC BYPASS  1979   revision in [place] in 2011   JOINT REPLACEMENT Right 1995   hip   JOINT REPLACEMENT Left 2008   hip   MEDIAL PARTIAL KNEE REPLACEMENT Left    Dr Ronnie Derby   PARAESOPHAGEAL HERNIA REPAIR  SEP 2010 DR. MCNATT  POLYPECTOMY  09/25/2017   Procedure: POLYPECTOMY;  Surgeon: Rogene Houston, MD;  [facility];  Service: Endoscopy;;  colon   POLYPECTOMY  04/29/2020   Procedure: POLYPECTOMY;  Surgeon: Harvel Quale, MD;  [facility];  Service: Gastroenterology;;   SHOULDER ARTHROSCOPY Right    SHOULDER ARTHROSCOPY Right    x2   SKIN BIOPSY     TONSILLECTOMY     TOTAL KNEE ARTHROPLASTY Right 09/08/2018   Procedure: RIGHT TOTAL KNEE ARTHROPLASTY;  Surgeon: Marybelle Killings, MD;  Location: [place];  Service: Orthopedics;  Laterality: Right;   TOTAL SHOULDER ARTHROPLASTY Right 02/16/2013   Procedure: TOTAL SHOULDER ARTHROPLASTY;  Surgeon:  Marybelle Killings, MD;  Location: [place];  Service: Orthopedics;  Laterality: Right;  Right Total Shoulder Arthroplasty, Cemented    Family History  Problem Relation Age of Onset   Colon cancer Brother        IN HIS 60s-METASTATIC   Colon polyps Paternal Grandfather    Breast cancer Mother    Hypertension Father    Heart disease Father    Hypertension Brother    Heart disease Brother     Social History   Tobacco Use   Smoking status: Never   Smokeless tobacco: Never  Vaping Use   Vaping Use: Never used  Substance Use Topics   Alcohol use: No   Drug use: No     Home Medications Prior to Admission medications   Medication Sig Start Date End Date Taking? Authorizing Provider  HYDROcodone-acetaminophen (NORCO/VICODIN) 5-325 MG tablet Take 1 tablet by mouth every 6 (six) hours as needed for severe pain. 08/27/20  Yes Truddie Hidden, MD  acetaminophen (TYLENOL) 500 MG tablet Take 1,000 mg by mouth every 6 (six) hours as needed for moderate pain or headache.    [provider]  albuterol (PROVENTIL) (2.5 MG/3ML) 0.083% nebulizer solution Take 3 mLs (2.5 mg total) by nebulization every 4 (four) hours as needed for wheezing. Patient taking differently: Take 2.5 mg by nebulization every 6 (six) hours as needed for wheezing. 07/21/15   Kathie Dike, MD  alendronate (FOSAMAX) 70 MG tablet Take 70 mg by mouth every Tuesday.  01/26/19   [provider]  AZO-CRANBERRY PO Take 2 tablets by mouth daily as needed (kidney infections).     [provider]  Calcium-Phosphorus-Vitamin D (CITRACAL +D3 PO) Take 1 tablet by mouth daily.    [provider]  Cholecalciferol (VITAMIN D3) 50 MCG (2000 UT) capsule Take 2,000 Units by mouth daily.     [provider]  conjugated estrogens (PREMARIN) vaginal cream Place 1 Applicatorful vaginally at bedtime. Use as directed at bedtime 03/08/20   Florian Buff, MD  dicyclomine (BENTYL) 10 MG capsule Take 1 capsule (10  mg total) by mouth every 12 (twelve) hours as needed for spasms. 04/14/20   Harvel Quale, MD  docusate sodium (COLACE) 100 MG capsule Take 100 mg by mouth at bedtime as needed for mild constipation.     [provider]  Ferrous Sulfate (SLOW FE PO) Take 1 tablet by mouth daily.    [provider]  fluticasone (FLONASE) 50 MCG/ACT nasal spray Place 2 sprays into both nostrils daily as needed for allergies. 07/07/19   [provider]  Lactobacillus (FLORAJEN ACIDOPHILUS) CAPS Take 1 capsule by mouth daily before breakfast.    [provider]  MUCINEX 600 MG 12 hr tablet Take 600 mg by mouth 2 (two) times daily  as needed for cough.  07/17/19   [provider]  nystatin ointment (MYCOSTATIN) Apply 1 application topically 2 (two) times daily as needed (for irritation).    [provider]  omeprazole (PRILOSEC) 20 MG capsule Take 20 mg by mouth daily.    [provider]  ondansetron (ZOFRAN) 4 MG tablet Take 4 mg by mouth every 8 (eight) hours as needed for nausea or vomiting.    [provider]  sertraline (ZOLOFT) 100 MG tablet Take 100 mg by mouth daily.    [provider]  traZODone (DESYREL) 50 MG tablet Take 50 mg by mouth at bedtime.    [provider]     Allergies    Novocain [procaine hcl], Other, Latex, Penicillins, and Sulfa antibiotics   Review of Systems   Review of Systems A comprehensive review of systems was completed and negative except as noted in HPI.    Physical Exam BP (!) 192/74   Pulse 78   Temp 98.6 F (37 C) (Oral)   Resp 18   Ht 5\' 3"  (1.6 m)   Wt 98 kg   SpO2 99%   BMI 38.27 kg/m   Physical Exam Vitals and nursing note reviewed.  HENT:     Head: Normocephalic.     Nose: Nose normal.  Eyes:     Extraocular Movements: Extraocular movements intact.  Pulmonary:     Effort: Pulmonary effort is normal.  Musculoskeletal:        General: Normal range of motion.      Cervical back: Neck supple.     Comments: Small bruise medial R knee at the injection site. No erythema, warmth or joint effusion. Normal ROM with minimal pain.   Skin:    Findings: No rash (on exposed skin).  Neurological:     Mental Status: She is alert and oriented to person, place, and time.  Psychiatric:        Mood and Affect: Mood normal.     ED Results / Procedures / Treatments   Labs (all labs ordered are listed, but only abnormal results are displayed) Labs Reviewed - No data to display  EKG None  Radiology No results found.  Procedures Procedures  Medications Ordered in the ED Medications  HYDROcodone-acetaminophen (NORCO/VICODIN) 5-325 MG per tablet 1 tablet (has no administration in time range)     MDM Rules/Calculators/A&P MDM  Patient likely with post-steroid injection pain. Advised this is common and should subside in a day or two. Will place in ACE wrap and give short course of Norco for pain control. Advised to follow up in ortho clinic if not improving. Advised norco may make her drowsy and to be cautious while taking it.  ED Course  I have reviewed the triage vital signs and the nursing notes.  Pertinent labs & imaging results that were available during my care of the patient were reviewed by me and considered in my medical decision making (see chart for details).     Final Clinical Impression(s) / ED Diagnoses Final diagnoses:  Acute pain of right knee    Rx / DC Orders ED Discharge Orders          Ordered    HYDROcodone-acetaminophen (NORCO/VICODIN) 5-325 MG tablet  Every 6 hours PRN        08/27/20 0834             Truddie Hidden, MD 08/27/20 [phone]

## 2020-08-27 NOTE — ED Notes (Signed)
Ace Wrap applied to right knee at this time

## 2020-08-27 NOTE — ED Triage Notes (Signed)
Patient states right knee pain since Thursday. Seen Dr. Lorin Mercy given steroid shot on Thursday and awoken with pain midnight. Patient ambulatory in triage with hx of right knee replacement over 2 years ago.

## 2020-08-29 DIAGNOSIS — G8929 Other chronic pain: Secondary | ICD-10-CM | POA: Diagnosis not present

## 2020-08-29 DIAGNOSIS — M7051 Other bursitis of knee, right knee: Secondary | ICD-10-CM | POA: Diagnosis not present

## 2020-08-29 DIAGNOSIS — M25562 Pain in left knee: Secondary | ICD-10-CM | POA: Diagnosis not present

## 2020-08-29 MED ORDER — LIDOCAINE HCL 1 % IJ SOLN
0.5000 mL | INTRAMUSCULAR | Status: AC | PRN
  Administered 2020-08-29: .5 mL

## 2020-08-29 MED ORDER — METHYLPREDNISOLONE ACETATE 40 MG/ML IJ SUSP
40.0000 mg | INTRAMUSCULAR | Status: AC | PRN
  Administered 2020-08-29: 40 mg via INTRA_ARTICULAR

## 2020-08-29 MED ORDER — BUPIVACAINE HCL 0.25 % IJ SOLN
2.0000 mL | INTRAMUSCULAR | Status: AC | PRN
  Administered 2020-08-29: 2 mL via INTRA_ARTICULAR

## 2020-09-06 ENCOUNTER — Other Ambulatory Visit: Payer: Self-pay

## 2020-09-06 ENCOUNTER — Ambulatory Visit: Payer: Medicare PPO | Admitting: Obstetrics & Gynecology

## 2020-09-06 ENCOUNTER — Encounter: Payer: Self-pay | Admitting: Obstetrics & Gynecology

## 2020-09-06 VITALS — Ht 63.0 in | Wt 216.0 lb

## 2020-09-06 DIAGNOSIS — B3731 Acute candidiasis of vulva and vagina: Secondary | ICD-10-CM

## 2020-09-06 DIAGNOSIS — B373 Candidiasis of vulva and vagina: Secondary | ICD-10-CM

## 2020-09-06 DIAGNOSIS — N362 Urethral caruncle: Secondary | ICD-10-CM

## 2020-09-06 MED ORDER — GERHARDT'S BUTT CREAM: 1.0000 "application " | TOPICAL_CREAM | Freq: Three times a day (TID) | CUTANEOUS | 11 refills | Status: AC | PRN

## 2020-09-06 NOTE — Progress Notes (Signed)
Chief Complaint  Patient presents with   Follow-up      80 y.o. G0P0000 No LMP recorded. Patient has had a hysterectomy. The current method of family planning is status post hysterectomy.  Outpatient Encounter Medications as of 09/06/2020  Medication Sig Note   acetaminophen (TYLENOL) 500 MG tablet Take 1,000 mg by mouth every 6 (six) hours as needed for moderate pain or headache.    albuterol (PROVENTIL) (2.5 MG/3ML) 0.083% nebulizer solution Take 3 mLs (2.5 mg total) by nebulization every 4 (four) hours as needed for wheezing. (Patient taking differently: Take 2.5 mg by nebulization every 6 (six) hours as needed for wheezing.)    alendronate (FOSAMAX) 70 MG tablet Take 70 mg by mouth every Tuesday.     AZO-CRANBERRY PO Take 2 tablets by mouth daily as needed (kidney infections).     Calcium-Phosphorus-Vitamin D (CITRACAL +D3 PO) Take 1 tablet by mouth daily.    Cholecalciferol (VITAMIN D3) 50 MCG (2000 UT) capsule Take 2,000 Units by mouth daily.     conjugated estrogens (PREMARIN) vaginal cream Place 1 Applicatorful vaginally at bedtime. Use as directed at bedtime    dicyclomine (BENTYL) 10 MG capsule Take 1 capsule (10 mg total) by mouth every 12 (twelve) hours as needed for spasms.    docusate sodium (COLACE) 100 MG capsule Take 100 mg by mouth at bedtime as needed for mild constipation.     Ferrous Sulfate (SLOW FE PO) Take 1 tablet by mouth daily.    fluticasone (FLONASE) 50 MCG/ACT nasal spray Place 2 sprays into both nostrils daily as needed for allergies. 09/03/2019: Pt states she doesn't use it until she needs it    HYDROcodone-acetaminophen (NORCO/VICODIN) 5-325 MG tablet Take 1 tablet by mouth every 6 (six) hours as needed for severe pain.    Lactobacillus (FLORAJEN ACIDOPHILUS) CAPS Take 1 capsule by mouth daily before breakfast.    lisinopril (ZESTRIL) 10 MG tablet Take 10 mg by mouth every morning.    meclizine (ANTIVERT) 25 MG tablet Take 25 mg by mouth every 4 (four)  hours as needed.    MUCINEX 600 MG 12 hr tablet Take 600 mg by mouth 2 (two) times daily as needed for cough.     naproxen (NAPROSYN) 500 MG tablet Take by mouth.    Nystatin (GERHARDT'S BUTT CREAM) CREA Apply 1 application topically 3 (three) times daily as needed for irritation.    nystatin ointment (MYCOSTATIN) Apply 1 application topically 2 (two) times daily as needed (for irritation).    omeprazole (PRILOSEC) 20 MG capsule Take 20 mg by mouth daily.    ondansetron (ZOFRAN) 4 MG tablet Take 4 mg by mouth every 8 (eight) hours as needed for nausea or vomiting.    sertraline (ZOLOFT) 100 MG tablet Take 100 mg by mouth daily.    traZODone (DESYREL) 50 MG tablet Take 50 mg by mouth at bedtime.    clobetasol (TEMOVATE) 0.05 % external solution SMARTSIG:6-8 Drop(s) Topical Every Night PRN    No facility-administered encounter medications on file as of 09/06/2020.    Subjective Pt is here for follow up of her urethral caruncle, it has not bled since removal of the polyp and using topical estrogen However she is having irritation which she gets most every summer from the heat and sweat She does not wears pads all the time Past Medical History:  Diagnosis Date   Anemia    Anxiety    Arthritis    Barrett's esophagus  seen on 2016 egd at [facility]   Depression    GERD (gastroesophageal reflux disease)    H/O hiatal hernia    Hypertension    Obesity    Pneumonia    PONV (postoperative nausea and vomiting)    Seasonal allergies    Sepsis ([place]) 10/02/2013    Past Surgical History:  Procedure Laterality Date   ABDOMINAL HYSTERECTOMY     ABDOMINAL SURGERY     APPENDECTOMY     BACK SURGERY     lumbar x2 by Dr Arnoldo Morale   BIOPSY  09/03/2019   Procedure: BIOPSY;  Surgeon: Rogene Houston, MD;  [facility];  Service: Endoscopy;;  esophagus   CARPAL TUNNEL RELEASE Right 03/2014   Dr. Lorin Mercy   CATARACT EXTRACTION W/PHACO Left 08/06/2013   Procedure: CATARACT EXTRACTION PHACO AND  INTRAOCULAR LENS PLACEMENT LEFT EYE;  Surgeon: Tonny Branch, MD;  [facility];  Service: Ophthalmology;  Laterality: Left;  CDE: 9.55   CATARACT EXTRACTION W/PHACO Right 08/24/2013   Procedure: CATARACT EXTRACTION PHACO AND INTRAOCULAR LENS PLACEMENT (IOC);  Surgeon: Tonny Branch, MD;  [facility]: [facility];  Service: Ophthalmology;  Laterality: Right;  CDE:8.30   CERVICAL FUSION     CHOLECYSTECTOMY     COLONOSCOPY N/A 11/03/2013   SLF: 1. Normal mucosa in the terminal ileum. 2. 13 colon polyps removed. 3. Moderate diverticulosis in the descending colon and sigmoid colon 4. the left colon is redundant 5. Small internal  hemorrhoids.   COLONOSCOPY N/A 09/25/2017   Procedure: COLONOSCOPY;  Surgeon: Rogene Houston, MD;  [facility];  Service: Endoscopy;  Laterality: N/A;  1:55   COLONOSCOPY WITH PROPOFOL N/A 04/29/2020   Procedure: COLONOSCOPY WITH PROPOFOL;  Surgeon: Harvel Quale, MD;  [facility];  Service: Gastroenterology;  Laterality: N/A;  12:45   ESOPHAGOGASTRODUODENOSCOPY N/A 11/03/2013   SLF: 1. 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 Mild Non-erosive gastritis   ESOPHAGOGASTRODUODENOSCOPY  10/2014   [facility]: IMPRESSIONS: - likely small distal esophageal diverticulum without evidence of fistula, +barrett's esophagus without dysplasia. s/p gastric bypass   ESOPHAGOGASTRODUODENOSCOPY (EGD) WITH PROPOFOL N/A 09/03/2019   Procedure: ESOPHAGOGASTRODUODENOSCOPY (EGD) WITH PROPOFOL;  Surgeon: Rogene Houston, MD;  [facility];  Service: Endoscopy;  Laterality: N/A;  205   EYE SURGERY     GASTRIC BYPASS  1979   revision in [place] in 2011   JOINT REPLACEMENT Right 1995   hip   JOINT REPLACEMENT Left 2008   hip   MEDIAL PARTIAL KNEE REPLACEMENT Left    Dr Ronnie Derby   PARAESOPHAGEAL HERNIA REPAIR  SEP 2010 DR. MCNATT   POLYPECTOMY  09/25/2017   Procedure: POLYPECTOMY;   Surgeon: Rogene Houston, MD;  [facility];  Service: Endoscopy;;  colon   POLYPECTOMY  04/29/2020   Procedure: POLYPECTOMY;  Surgeon: Harvel Quale, MD;  [facility];  Service: Gastroenterology;;   SHOULDER ARTHROSCOPY Right    SHOULDER ARTHROSCOPY Right    x2   SKIN BIOPSY     TONSILLECTOMY     TOTAL KNEE ARTHROPLASTY Right 09/08/2018   Procedure: RIGHT TOTAL KNEE ARTHROPLASTY;  Surgeon: Marybelle Killings, MD;  Location: [place];  Service: Orthopedics;  Laterality: Right;   TOTAL SHOULDER ARTHROPLASTY Right 02/16/2013   Procedure: TOTAL SHOULDER ARTHROPLASTY;  Surgeon: Marybelle Killings, MD;  Location: [place];  Service: Orthopedics;  Laterality: Right;  Right Total Shoulder Arthroplasty, Cemented    OB History     Gravida  0   Para  0   Term  0   Preterm  0   AB  0   Living  0      SAB  0   IAB  0   Ectopic  0   Multiple  0   Live Births  0           Allergies  Allergen Reactions   Novocain [Procaine Hcl] Other (See Comments)    Unknown-passed out with novocaine injected for dental surgery.   Other Hives and Other (See Comments)    EKG pads - need to use pediatric pads   Latex Rash   Penicillins Rash and Other (See Comments)    Has patient had a PCN reaction causing immediate rash, facial/tongue/throat swelling, SOB or lightheadedness with hypotension: Yes Has patient had a PCN reaction causing severe rash involving mucus membranes or skin necrosis: No Has patient had a PCN reaction that required hospitalization No Has patient had a PCN reaction occurring within the last 10 years: No  If all of the above answers are "NO", then may proceed with Cephalosporin use.    Sulfa Antibiotics Rash    Social History   Socioeconomic History   Marital status: Single    Spouse name: Not on file   Number of children: Not on file   Years of education: Not on file   Highest education level: Not on file  Occupational History   Not on file   Tobacco Use   Smoking status: Never   Smokeless tobacco: Never  Vaping Use   Vaping Use: Never used  Substance and Sexual Activity   Alcohol use: No   Drug use: No   Sexual activity: Yes    Birth control/protection: Surgical  Other Topics Concern   Not on file  Social History Narrative   Not on file   Social Determinants of Health   Financial Resource Strain: Not on file  Food Insecurity: Not on file  Transportation Needs: Not on file  Physical Activity: Not on file  Stress: Not on file  Social Connections: Not on file    Family History  Problem Relation Age of Onset   Colon cancer Brother        IN HIS 60s-METASTATIC   Colon polyps Paternal Grandfather    Breast cancer Mother    Hypertension Father    Heart disease Father    Hypertension Brother    Heart disease Brother     Medications:       Current Outpatient Medications:    acetaminophen (TYLENOL) 500 MG tablet, Take 1,000 mg by mouth every 6 (six) hours as needed for moderate pain or headache., Disp: , Rfl:    albuterol (PROVENTIL) (2.5 MG/3ML) 0.083% nebulizer solution, Take 3 mLs (2.5 mg total) by nebulization every 4 (four) hours as needed for wheezing. (Patient taking differently: Take 2.5 mg by nebulization every 6 (six) hours as needed for wheezing.), Disp: 75 mL, Rfl: 12   alendronate (FOSAMAX) 70 MG tablet, Take 70 mg by mouth every Tuesday. , Disp: , Rfl:    AZO-CRANBERRY PO, Take 2 tablets by mouth daily as needed (kidney infections). , Disp: , Rfl:    Calcium-Phosphorus-Vitamin D (CITRACAL +D3 PO), Take 1 tablet by mouth daily., Disp: , Rfl:    Cholecalciferol (VITAMIN D3) 50 MCG (2000 UT) capsule, Take 2,000  Units by mouth daily. , Disp: , Rfl:    conjugated estrogens (PREMARIN) vaginal cream, Place 1 Applicatorful vaginally at bedtime. Use as directed at bedtime, Disp: 30 g, Rfl: 12   dicyclomine (BENTYL) 10 MG capsule, Take 1 capsule (10 mg total) by mouth every 12 (twelve) hours as needed for  spasms., Disp: 60 capsule, Rfl: 2   docusate sodium (COLACE) 100 MG capsule, Take 100 mg by mouth at bedtime as needed for mild constipation. , Disp: , Rfl:    Ferrous Sulfate (SLOW FE PO), Take 1 tablet by mouth daily., Disp: , Rfl:    fluticasone (FLONASE) 50 MCG/ACT nasal spray, Place 2 sprays into both nostrils daily as needed for allergies., Disp: , Rfl:    HYDROcodone-acetaminophen (NORCO/VICODIN) 5-325 MG tablet, Take 1 tablet by mouth every 6 (six) hours as needed for severe pain., Disp: 12 tablet, Rfl: 0   Lactobacillus (FLORAJEN ACIDOPHILUS) CAPS, Take 1 capsule by mouth daily before breakfast., Disp: , Rfl:    lisinopril (ZESTRIL) 10 MG tablet, Take 10 mg by mouth every morning., Disp: , Rfl:    meclizine (ANTIVERT) 25 MG tablet, Take 25 mg by mouth every 4 (four) hours as needed., Disp: , Rfl:    MUCINEX 600 MG 12 hr tablet, Take 600 mg by mouth 2 (two) times daily as needed for cough. , Disp: , Rfl:    naproxen (NAPROSYN) 500 MG tablet, Take by mouth., Disp: , Rfl:    Nystatin (GERHARDT'S BUTT CREAM) CREA, Apply 1 application topically 3 (three) times daily as needed for irritation., Disp: 1 each, Rfl: 11   nystatin ointment (MYCOSTATIN), Apply 1 application topically 2 (two) times daily as needed (for irritation)., Disp: , Rfl:    omeprazole (PRILOSEC) 20 MG capsule, Take 20 mg by mouth daily., Disp: , Rfl:    ondansetron (ZOFRAN) 4 MG tablet, Take 4 mg by mouth every 8 (eight) hours as needed for nausea or vomiting., Disp: , Rfl:    sertraline (ZOLOFT) 100 MG tablet, Take 100 mg by mouth daily., Disp: , Rfl:    traZODone (DESYREL) 50 MG tablet, Take 50 mg by mouth at bedtime., Disp: , Rfl:    clobetasol (TEMOVATE) 0.05 % external solution, SMARTSIG:6-8 Drop(s) Topical Every Night PRN, Disp: , Rfl:   Objective Height 5\' 3"  (1.6 m), weight 216 lb (98 kg).  General WDWN female NAD Vulva:  vulva with candidal changes due to heat it appears Urethral caruncle is smaller than a  matchhead at this point Vagina:  normal mucosa, no discharge     Pertinent ROS No burning with urination, frequency or urgency No nausea, vomiting or diarrhea Nor fever chills or other constitutional symptoms   Labs or studies     Impression Diagnoses this Encounter::   ICD-10-CM   1. 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 Candidal vulvovaginitis  B37.3    painted with gentian violet today, begin using Fanny cream(zinc oxide/nystatin/triamcinolone cream)      Established relevant diagnosis(es):   Plan/Recommendations: Meds ordered this encounter  Medications   Nystatin (GERHARDT'S BUTT CREAM) CREA    Sig: Apply 1 application topically 3 (three) times daily as needed for irritation.    Dispense:  1 each    Refill:  11    Labs or Scans Ordered: No orders of the defined types were placed in this encounter.   Management:: As noted above  Follow up Return if symptoms worsen or fail to improve.  All questions were answered.

## 2020-09-07 ENCOUNTER — Other Ambulatory Visit (INDEPENDENT_AMBULATORY_CARE_PROVIDER_SITE_OTHER): Payer: Self-pay | Admitting: Gastroenterology

## 2020-09-07 DIAGNOSIS — K582 Mixed irritable bowel syndrome: Secondary | ICD-10-CM

## 2020-09-08 ENCOUNTER — Ambulatory Visit: Payer: Medicare PPO | Admitting: Surgery

## 2020-09-08 ENCOUNTER — Encounter: Payer: Self-pay | Admitting: Surgery

## 2020-09-08 VITALS — BP 145/76 | HR 76 | Ht 63.0 in | Wt 216.0 lb

## 2020-09-08 DIAGNOSIS — T8484XA Pain due to internal orthopedic prosthetic devices, implants and grafts, initial encounter: Secondary | ICD-10-CM | POA: Diagnosis not present

## 2020-09-08 DIAGNOSIS — G8929 Other chronic pain: Secondary | ICD-10-CM

## 2020-09-08 DIAGNOSIS — Z96651 Presence of right artificial knee joint: Secondary | ICD-10-CM | POA: Diagnosis not present

## 2020-09-08 DIAGNOSIS — M25561 Pain in right knee: Secondary | ICD-10-CM | POA: Diagnosis not present

## 2020-09-08 NOTE — Progress Notes (Signed)
Office Visit Note   Patient: [name] [name]           Date of Birth: [birth year]           MRN: [id number] Visit Date: 09/08/2020              Requested by: Curlene Labrum, MD [place],  [place] [id number] PCP: Curlene Labrum, MD   Assessment & Plan: Visit Diagnoses:  1. Chronic pain of right knee   2. Pain due to total right knee replacement, initial encounter [facility])     Plan: With patient's ongoing right knee pain and failed conservative treatment I will get a bone scan to rule out the possibility of prosthetic loosening.  I was not able to review x-rays that were done previously in [place].  Blood work also drawn today to check a CBC with differential and arthritis panel.  Patient will follow-up with Dr. Lorin Mercy in the [place] [facility] in 3 weeks.  Follow-Up Instructions: Return in about 3 weeks (around 09/29/2020) for With Dr. Lorin Mercy in the [place] clinic to review bone scan and lab work results.   Orders:  Orders Placed This Encounter  Procedures   NM Bone Scan 3 Phase   CBC with Differential   Antinuclear Antib (ANA)   Rheumatoid Factor   Uric acid   Sed Rate (ESR)   No orders of the defined types were placed in this encounter.     Procedures: No procedures performed   Clinical Data: No additional findings.   Subjective: Chief Complaint  Patient presents with   Right Knee - Pain   Right Leg - Pain    HPI 80 year old white female returns with complaints of right knee pain.  Status post right total knee replacement by Dr. Lorin Mercy September 08, 2018.  Patient had pes bursa injection last visit with him and states that this gave no relief.  Patient continued to have ongoing right knee pain and went to the ED August 27, 2020.  Continues to have pain with weightbearing throughout the knee joint.  Some pain extending down her lower leg.   Objective: Vital Signs: BP (!) 145/76   Pulse 76   Ht 5' 3"  (1.6 m)   Wt 216 lb (98 kg)   BMI 38.26 kg/m   Physical Exam Very  pleasant elderly female alert and oriented in no acute distress.  Right knee is diffusely tender.  Some swelling without large effusion.  No signs of infection.  Calf nontender. Ortho Exam  Specialty Comments:  No specialty comments available.  Imaging: No results found.   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   History of arthroplasty of right shoulder 08/14/2018   History of colonic polyps 08/14/2017   Choledocholithiasis    Abnormal CT scan, esophagus    Cholestasis 02/05/2017   Depression 02/05/2017   Hypomagnesemia 02/05/2017   Bradycardia 02/05/2017   Transaminitis 07/08/2016   Constipation 07/08/2016   GIB (gastrointestinal bleeding) 07/08/2016   HCAP (healthcare-associated pneumonia) 09/23/2015   AKI (acute kidney injury) ([place]) 07/21/2015   Hypokalemia 07/21/2015   CAP (community acquired pneumonia) 07/14/2015   Upper abdominal pain 06/10/2014  Folliculitis of perineum 02/24/2014   Giant comedone 12/21/2013   Sebaceous gland hyperplasia of vulva 11/25/2013   Acrochordon 11/25/2013   Dysphagia 10/29/2013   Class 2 severe obesity due to excess calories with serious comorbidity and body mass index (BMI) of 38.0 to 38.9 in adult [facility]) 10/02/2013   Primary hypertension    GERD (gastroesophageal reflux disease)    Anxiety    Anemia of chronic disease    Past Medical History:  Diagnosis Date   Anemia    Anxiety    Arthritis    Barrett's esophagus    seen on 2016 egd at [facility]   Depression    GERD (gastroesophageal reflux disease)    H/O hiatal hernia    Hypertension    Obesity    Pneumonia    PONV (postoperative nausea and  vomiting)    Seasonal allergies    Sepsis ([place]) 10/02/2013    Family History  Problem Relation Age of Onset   Colon cancer Brother        IN HIS 60s-METASTATIC   Colon polyps Paternal Grandfather    Breast cancer Mother    Hypertension Father    Heart disease Father    Hypertension Brother    Heart disease Brother     Past Surgical History:  Procedure Laterality Date   ABDOMINAL HYSTERECTOMY     ABDOMINAL SURGERY     APPENDECTOMY     BACK SURGERY     lumbar x2 by Dr Arnoldo Morale   BIOPSY  09/03/2019   Procedure: BIOPSY;  Surgeon: Rogene Houston, MD;  [facility];  Service: Endoscopy;;  esophagus   CARPAL TUNNEL RELEASE Right 03/2014   Dr. Lorin Mercy   CATARACT EXTRACTION W/PHACO Left 08/06/2013   Procedure: CATARACT EXTRACTION PHACO AND INTRAOCULAR LENS PLACEMENT LEFT EYE;  Surgeon: Tonny Branch, MD;  [facility];  Service: Ophthalmology;  Laterality: Left;  CDE: 9.55   CATARACT EXTRACTION W/PHACO Right 08/24/2013   Procedure: CATARACT EXTRACTION PHACO AND INTRAOCULAR LENS PLACEMENT (IOC);  Surgeon: Tonny Branch, MD;  [facility];  Service: Ophthalmology;  Laterality: Right;  CDE:8.30   CERVICAL FUSION     CHOLECYSTECTOMY     COLONOSCOPY N/A 11/03/2013   SLF: 1. Normal mucosa in the terminal ileum. 2. 13 colon polyps removed. 3. Moderate diverticulosis in the descending colon and sigmoid colon 4. the left colon is redundant 5. Small internal  hemorrhoids.   COLONOSCOPY N/A 09/25/2017   Procedure: COLONOSCOPY;  Surgeon: Rogene Houston, MD;  [facility];  Service: Endoscopy;  Laterality: N/A;  1:55   COLONOSCOPY WITH PROPOFOL N/A 04/29/2020   Procedure: COLONOSCOPY WITH PROPOFOL;  Surgeon: Harvel Quale, MD;  [facility];  Service: Gastroenterology;  Laterality: N/A;  12:45   ESOPHAGOGASTRODUODENOSCOPY N/A 11/03/2013   SLF: 1. 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 Mild Non-erosive  gastritis   ESOPHAGOGASTRODUODENOSCOPY  10/2014   [facility]: IMPRESSIONS: - likely small distal esophageal diverticulum without evidence of fistula, +barrett's esophagus without dysplasia. s/p gastric bypass   ESOPHAGOGASTRODUODENOSCOPY (EGD) WITH PROPOFOL N/A 09/03/2019   Procedure: ESOPHAGOGASTRODUODENOSCOPY (EGD) WITH PROPOFOL;  Surgeon: Rogene Houston, MD;  [facility];  Service: Endoscopy;  Laterality: N/A;  205   EYE SURGERY     GASTRIC BYPASS  1979   revision in [place] in 2011   JOINT REPLACEMENT Right  1995   hip   JOINT REPLACEMENT Left 2008   hip   MEDIAL PARTIAL KNEE REPLACEMENT Left    Dr Ronnie Derby   PARAESOPHAGEAL [place]  SEP 2010 DR. MCNATT   POLYPECTOMY  09/25/2017   Procedure: POLYPECTOMY;  Surgeon: Rogene Houston, MD;  [facility];  Service: Endoscopy;;  colon   POLYPECTOMY  04/29/2020   Procedure: POLYPECTOMY;  Surgeon: Harvel Quale, MD;  [facility];  Service: Gastroenterology;;   SHOULDER ARTHROSCOPY Right    SHOULDER ARTHROSCOPY Right    x2   SKIN BIOPSY     TONSILLECTOMY     TOTAL KNEE ARTHROPLASTY Right 09/08/2018   Procedure: RIGHT TOTAL KNEE ARTHROPLASTY;  Surgeon: Marybelle Killings, MD;  Location: [place];  Service: Orthopedics;  Laterality: Right;   TOTAL SHOULDER ARTHROPLASTY Right 02/16/2013   Procedure: TOTAL SHOULDER ARTHROPLASTY;  Surgeon: Marybelle Killings, MD;  Location: [place];  Service: Orthopedics;  Laterality: Right;  Right Total Shoulder Arthroplasty, Cemented   Social History   Occupational History   Not on file  Tobacco Use   Smoking status: Never   Smokeless tobacco: Never  Vaping Use   Vaping Use: Never used  Substance and Sexual Activity   Alcohol use: No   Drug use: No   Sexual activity: Yes    Birth control/protection: Surgical

## 2020-09-11 LAB — CBC WITH DIFFERENTIAL/PLATELET
Absolute Monocytes: 647 cells/uL (ref 200–950)
Basophils Absolute: 39 cells/uL (ref 0–200)
Basophils Relative: 0.8 %
Eosinophils Absolute: 289 cells/uL (ref 15–500)
Eosinophils Relative: 5.9 %
HCT: 44 % (ref 35.0–45.0)
Hemoglobin: 14.5 g/dL (ref 11.7–15.5)
Lymphs Abs: 789 cells/uL — ABNORMAL LOW (ref 850–3900)
MCH: 29.4 pg (ref 27.0–33.0)
MCHC: 33 g/dL (ref 32.0–36.0)
MCV: 89.2 fL (ref 80.0–100.0)
MPV: 12.1 fL (ref 7.5–12.5)
Monocytes Relative: 13.2 %
Neutro Abs: 3136 cells/uL (ref 1500–7800)
Neutrophils Relative %: 64 %
Platelets: 152 10*3/uL (ref 140–400)
RBC: 4.93 10*6/uL (ref 3.80–5.10)
RDW: 13.4 % (ref 11.0–15.0)
Total Lymphocyte: 16.1 %
WBC: 4.9 10*3/uL (ref 3.8–10.8)

## 2020-09-11 LAB — RHEUMATOID FACTOR: Rheumatoid fact SerPl-aCnc: <14 IU/mL (ref <14)

## 2020-09-11 LAB — ANA: Anti Nuclear Antibody (ANA): NEGATIVE

## 2020-09-11 LAB — SEDIMENTATION RATE: Sed Rate: 6 mm/h (ref 0–30)

## 2020-09-11 LAB — URIC ACID: Uric Acid, Serum: 5.7 mg/dL (ref 2.5–7.0)

## 2020-09-16 ENCOUNTER — Other Ambulatory Visit: Payer: Self-pay

## 2020-09-16 ENCOUNTER — Telehealth: Payer: Self-pay | Admitting: Obstetrics & Gynecology

## 2020-09-16 NOTE — Telephone Encounter (Signed)
Patient called stating that she is having issues with a medication that Dr. Elonda Husky has prescribed, patient states that she is getting blister from the medication. Please contact pt

## 2020-09-16 NOTE — Telephone Encounter (Signed)
Called pt for clarification. Pt states that she used the new cream Dr Elonda Husky prescribed and she broke out in severe redness and irritation. She stopped using it after two days and is trying to keep her vulvar area clean and dry. She states that the previous cream she used did not cause any allergic reactions. Dr Elonda Husky sent a msg for any other advice and refill request for new cream.

## 2020-09-19 NOTE — Telephone Encounter (Signed)
The other creams are not for the yeast so there is nothing different to do at this point

## 2020-09-20 ENCOUNTER — Other Ambulatory Visit: Payer: Self-pay

## 2020-09-20 ENCOUNTER — Ambulatory Visit (HOSPITAL_COMMUNITY)
Admission: RE | Admit: 2020-09-20 | Discharge: 2020-09-20 | Disposition: A | Discharge status: Home / Self Care | Payer: Medicare PPO | Source: Ambulatory Visit | Attending: Surgery | Admitting: Surgery

## 2020-09-20 DIAGNOSIS — G8929 Other chronic pain: Secondary | ICD-10-CM | POA: Insufficient documentation

## 2020-09-20 DIAGNOSIS — Z96651 Presence of right artificial knee joint: Secondary | ICD-10-CM | POA: Diagnosis not present

## 2020-09-20 DIAGNOSIS — T8484XA Pain due to internal orthopedic prosthetic devices, implants and grafts, initial encounter: Secondary | ICD-10-CM | POA: Diagnosis not present

## 2020-09-20 DIAGNOSIS — M25561 Pain in right knee: Secondary | ICD-10-CM | POA: Insufficient documentation

## 2020-09-20 MED ORDER — TECHNETIUM TC 99M MEDRONATE IV KIT
20.0000 | PACK | Freq: Once | INTRAVENOUS | Status: AC | PRN
  Administered 2020-09-20: 22.6 via INTRAVENOUS

## 2020-10-02 DIAGNOSIS — N182 Chronic kidney disease, stage 2 (mild): Secondary | ICD-10-CM | POA: Diagnosis not present

## 2020-10-02 DIAGNOSIS — I5032 Chronic diastolic (congestive) heart failure: Secondary | ICD-10-CM | POA: Diagnosis not present

## 2020-10-02 DIAGNOSIS — I130 Hypertensive heart and chronic kidney disease with heart failure and stage 1 through stage 4 chronic kidney disease, or unspecified chronic kidney disease: Secondary | ICD-10-CM | POA: Diagnosis not present

## 2020-10-13 ENCOUNTER — Ambulatory Visit (INDEPENDENT_AMBULATORY_CARE_PROVIDER_SITE_OTHER): Payer: Medicare PPO | Admitting: Gastroenterology

## 2020-10-25 DIAGNOSIS — M79675 Pain in left toe(s): Secondary | ICD-10-CM | POA: Diagnosis not present

## 2020-10-25 DIAGNOSIS — L03032 Cellulitis of left toe: Secondary | ICD-10-CM | POA: Diagnosis not present

## 2020-10-31 DIAGNOSIS — D519 Vitamin B12 deficiency anemia, unspecified: Secondary | ICD-10-CM | POA: Diagnosis not present

## 2020-10-31 DIAGNOSIS — K219 Gastro-esophageal reflux disease without esophagitis: Secondary | ICD-10-CM | POA: Diagnosis not present

## 2020-10-31 DIAGNOSIS — E559 Vitamin D deficiency, unspecified: Secondary | ICD-10-CM | POA: Diagnosis not present

## 2020-10-31 DIAGNOSIS — R011 Cardiac murmur, unspecified: Secondary | ICD-10-CM | POA: Diagnosis not present

## 2020-10-31 DIAGNOSIS — I10 Essential (primary) hypertension: Secondary | ICD-10-CM | POA: Diagnosis not present

## 2020-10-31 DIAGNOSIS — N189 Chronic kidney disease, unspecified: Secondary | ICD-10-CM | POA: Diagnosis not present

## 2020-11-03 DIAGNOSIS — E876 Hypokalemia: Secondary | ICD-10-CM | POA: Diagnosis not present

## 2020-11-03 DIAGNOSIS — R011 Cardiac murmur, unspecified: Secondary | ICD-10-CM | POA: Diagnosis not present

## 2020-11-03 DIAGNOSIS — I10 Essential (primary) hypertension: Secondary | ICD-10-CM | POA: Diagnosis not present

## 2020-11-03 DIAGNOSIS — N182 Chronic kidney disease, stage 2 (mild): Secondary | ICD-10-CM | POA: Diagnosis not present

## 2020-11-03 DIAGNOSIS — D696 Thrombocytopenia, unspecified: Secondary | ICD-10-CM | POA: Diagnosis not present

## 2020-11-03 DIAGNOSIS — I5032 Chronic diastolic (congestive) heart failure: Secondary | ICD-10-CM | POA: Diagnosis not present

## 2020-11-03 DIAGNOSIS — M159 Polyosteoarthritis, unspecified: Secondary | ICD-10-CM | POA: Diagnosis not present

## 2020-11-03 DIAGNOSIS — F339 Major depressive disorder, recurrent, unspecified: Secondary | ICD-10-CM | POA: Diagnosis not present

## 2020-11-08 DIAGNOSIS — M79676 Pain in unspecified toe(s): Secondary | ICD-10-CM | POA: Diagnosis not present

## 2020-11-08 DIAGNOSIS — L03032 Cellulitis of left toe: Secondary | ICD-10-CM | POA: Diagnosis not present

## 2020-11-30 DIAGNOSIS — B029 Zoster without complications: Secondary | ICD-10-CM | POA: Diagnosis not present

## 2020-11-30 DIAGNOSIS — L570 Actinic keratosis: Secondary | ICD-10-CM | POA: Diagnosis not present

## 2020-12-05 ENCOUNTER — Ambulatory Visit (INDEPENDENT_AMBULATORY_CARE_PROVIDER_SITE_OTHER): Payer: Medicare PPO | Admitting: Gastroenterology

## 2020-12-06 ENCOUNTER — Other Ambulatory Visit: Payer: Self-pay

## 2020-12-06 ENCOUNTER — Encounter (INDEPENDENT_AMBULATORY_CARE_PROVIDER_SITE_OTHER): Payer: Self-pay | Admitting: Gastroenterology

## 2020-12-06 ENCOUNTER — Ambulatory Visit (INDEPENDENT_AMBULATORY_CARE_PROVIDER_SITE_OTHER): Payer: Medicare PPO | Admitting: Gastroenterology

## 2020-12-06 VITALS — BP 96/65 | HR 84 | Temp 98.4°F | Ht 63.0 in | Wt 222.9 lb

## 2020-12-06 DIAGNOSIS — K219 Gastro-esophageal reflux disease without esophagitis: Secondary | ICD-10-CM

## 2020-12-06 DIAGNOSIS — K582 Mixed irritable bowel syndrome: Secondary | ICD-10-CM | POA: Diagnosis not present

## 2020-12-06 DIAGNOSIS — D508 Other iron deficiency anemias: Secondary | ICD-10-CM | POA: Diagnosis not present

## 2020-12-06 MED ORDER — OMEPRAZOLE 20 MG PO CPDR: 20.0000 mg | DELAYED_RELEASE_CAPSULE | Freq: Every day | ORAL | 3 refills | Status: AC

## 2020-12-06 NOTE — Progress Notes (Signed)
Referring Provider: Curlene Labrum, MD Primary Care Physician:  Curlene Labrum, MD Primary GI Physician: Jenetta Downer  Chief Complaint  Patient presents with   Follow-up    Patient here today for a follow up. She reports she has had shingles for the last two weeks, located on her back. She states she has some lower abdominal pain. She ahs some issues with nausea at times and takes dicyclomine 10 mg prn and Zofran prn. Has spells of not having a bm and then days where she has several bm. She states the stools are normally loose.    HPI:   [name] [name] is a 80 y.o. female with past medical history of IBS-C, IDA, Gerd, Barrett's esophagus, anxiety, depression, htn.  Patient presenting today for follow up of IBS-C, IDA, GERD.  IBS: mixed. she reports that sometimes when she eats she will have diarrhea, other days she may not have a BM at all. She may have 2-3 BMs one day and then may go 3-4 days without having a BM, she will take miralax or a stool softener when constipation occurs, which provides good results. She reports that sometimes chocolate constipates her as well so she tries to avoid this. She takes her dicyclomine on occasion which helps with her episodes of diarrhea or occasional abdominal cramping. She also reports that she takes zofran on occasion as well. She denies nausea but states that zofran sometimes helps her abdominal pain.   She reports some recent mid to lower abdominal soreness(umbilicus region) however she has shingles on her R side as well as a few on her abdomen, she feels her pain is probably related to these.   IDA: hgb 14.5 09/08/2020, currently on ferrous sulfate (slowfe) once daily. She denies sob or dizziness, or fatigue. Denies blood in stools or black stools. She has PCP appointment where she will have labs done next month. Colonoscopy in feb 2022, findings as below.  GERD: omeprazole 20mg  daily, well controlled on this. She reports very occasional reflux  with certain things she eats like pizza. No dysphagia or odynophagia.   No red flag symptoms. Patient denies melena, hematochezia, nausea, vomiting, dysphagia, odyonophagia, early satiety or weight loss.   Last Colonoscopy:2/25/22Two 3 to 5 mm polyps in the cecum,  - Three 3 to 6 mm polyps in the ascending colon,  - Four 4 to 8 mm polyps in the transverse colon,  - Seven 3 to 9 mm polyps in the descending colon,  - Diverticulosis in the sigmoid colon and in the descending colon. - The distal rectum and anal verge are normal on retroflexion view. (Tubular adenomas, sessile serrated polyps, hyperplastic polyps) no recommended repeat  Last Endoscopy:09/03/19- Normal hypopharynx. - Normal proximal esophagus and mid esophagus. - Diverticulum in the distal esophagus. - Esophageal mucosal changes secondary to established short-segment Barrett's disease.(Confirmed BE, short segment) - Z-line regular, 34 cm from the incisors. - Gastric bypass with a small-sized pouch and intact staple line. Gastrojejunal anastomosis characterized by healthy appearing mucosa.- Normal gastric body. - Normal examined jejunum.  Past Medical History:  Diagnosis Date   Anemia    Anxiety    Arthritis    Barrett's esophagus    seen on 2016 egd at [facility]   Depression    GERD (gastroesophageal reflux disease)    H/O hiatal hernia    Hypertension    Obesity    Pneumonia    PONV (postoperative nausea and vomiting)    Seasonal allergies  Sepsis ([place]) 10/02/2013    Past Surgical History:  Procedure Laterality Date   ABDOMINAL HYSTERECTOMY     ABDOMINAL SURGERY     APPENDECTOMY     BACK SURGERY     lumbar x2 by Dr Arnoldo Morale   BIOPSY  09/03/2019   Procedure: BIOPSY;  Surgeon: Rogene Houston, MD;  [facility];  Service: Endoscopy;;  esophagus   CARPAL TUNNEL RELEASE Right 03/2014   Dr. Lorin Mercy   CATARACT EXTRACTION W/PHACO Left 08/06/2013   Procedure: CATARACT EXTRACTION PHACO AND INTRAOCULAR LENS  PLACEMENT LEFT EYE;  Surgeon: Tonny Branch, MD;  Location: [facility];  Service: Ophthalmology;  Laterality: Left;  CDE: 9.55   CATARACT EXTRACTION W/PHACO Right 08/24/2013   Procedure: CATARACT EXTRACTION PHACO AND INTRAOCULAR LENS PLACEMENT (IOC);  Surgeon: Tonny Branch, MD;  Location: [facility];  Service: Ophthalmology;  Laterality: Right;  CDE:8.30   CERVICAL FUSION     CHOLECYSTECTOMY     COLONOSCOPY N/A 11/03/2013   SLF: 1. Normal mucosa in the terminal ileum. 2. 13 colon polyps removed. 3. Moderate diverticulosis in the descending colon and sigmoid colon 4. the left colon is redundant 5. Small internal  hemorrhoids.   COLONOSCOPY N/A 09/25/2017   Procedure: COLONOSCOPY;  Surgeon: Rogene Houston, MD;  [facility];  Service: Endoscopy;  Laterality: N/A;  1:55   COLONOSCOPY WITH PROPOFOL N/A 04/29/2020   Procedure: COLONOSCOPY WITH PROPOFOL;  Surgeon: Harvel Quale, MD;  [facility];  Service: Gastroenterology;  Laterality: N/A;  12:45   ESOPHAGOGASTRODUODENOSCOPY N/A 11/03/2013   SLF: 1. 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 Mild Non-erosive gastritis   ESOPHAGOGASTRODUODENOSCOPY  10/2014   [facility]: IMPRESSIONS: - likely small distal esophageal diverticulum without evidence of fistula, +barrett's esophagus without dysplasia. s/p gastric bypass   ESOPHAGOGASTRODUODENOSCOPY (EGD) WITH PROPOFOL N/A 09/03/2019   Procedure: ESOPHAGOGASTRODUODENOSCOPY (EGD) WITH PROPOFOL;  Surgeon: Rogene Houston, MD;  [facility];  Service: Endoscopy;  Laterality: N/A;  205   EYE SURGERY     GASTRIC BYPASS  1979   revision in [place] in 2011   JOINT REPLACEMENT Right 1995   hip   JOINT REPLACEMENT Left 2008   hip   MEDIAL PARTIAL KNEE REPLACEMENT Left    Dr Ronnie Derby   PARAESOPHAGEAL HERNIA REPAIR  SEP 2010 DR. MCNATT   POLYPECTOMY  09/25/2017   Procedure: POLYPECTOMY;  Surgeon: Rogene Houston, MD;  [facility];  Service: Endoscopy;;  colon   POLYPECTOMY  04/29/2020   Procedure: POLYPECTOMY;  Surgeon: Harvel Quale, MD;  [facility];  Service: Gastroenterology;;   SHOULDER ARTHROSCOPY Right    SHOULDER ARTHROSCOPY Right    x2   SKIN BIOPSY     TONSILLECTOMY     TOTAL KNEE ARTHROPLASTY Right 09/08/2018   Procedure: RIGHT TOTAL KNEE ARTHROPLASTY;  Surgeon: Marybelle Killings, MD;  Location: [place];  Service: Orthopedics;  Laterality: Right;   TOTAL SHOULDER ARTHROPLASTY Right 02/16/2013   Procedure: TOTAL SHOULDER ARTHROPLASTY;  Surgeon: Marybelle Killings, MD;  Location: [place];  Service: Orthopedics;  Laterality: Right;  Right Total Shoulder Arthroplasty, Cemented    Current Outpatient Medications  Medication Sig Dispense Refill   acetaminophen (TYLENOL) 500 MG tablet Take 1,000 mg by mouth every 6 (six) hours as needed for moderate pain or headache.  albuterol (PROVENTIL) (2.5 MG/3ML) 0.083% nebulizer solution Take 3 mLs (2.5 mg total) by nebulization every 4 (four) hours as needed for wheezing. (Patient taking differently: Take 2.5 mg by nebulization every 6 (six) hours as needed for wheezing.) 75 mL 12   alendronate (FOSAMAX) 70 MG tablet Take 70 mg by mouth every Tuesday.      AZO-CRANBERRY PO Take 2 tablets by mouth daily as needed (kidney infections).      Calcium-Phosphorus-Vitamin D (CITRACAL +D3 PO) Take 1 tablet by mouth daily.     Cholecalciferol (VITAMIN D3) 50 MCG (2000 UT) capsule Take 2,000 Units by mouth daily.      clobetasol (TEMOVATE) 0.05 % external solution SMARTSIG:6-8 Drop(s) Topical Every Night PRN     conjugated estrogens (PREMARIN) vaginal cream Place 1 Applicatorful vaginally at bedtime. Use as directed at bedtime 30 g 12   dicyclomine (BENTYL) 10 MG capsule TAKE ONE CAPSULE BY MOUTH EVERY TWELVE HOURS AS NEEDED FOR SPASMS 60 capsule 2   docusate sodium (COLACE) 100 MG capsule Take 100 mg by mouth at bedtime as needed for  mild constipation.      Ferrous Sulfate (SLOW FE PO) Take 1 tablet by mouth daily.     fluticasone (FLONASE) 50 MCG/ACT nasal spray Place 2 sprays into both nostrils daily as needed for allergies.     HYDROcodone-acetaminophen (NORCO/VICODIN) 5-325 MG tablet Take 1 tablet by mouth every 6 (six) hours as needed for severe pain. 12 tablet 0   Lactobacillus (FLORAJEN ACIDOPHILUS) CAPS Take 1 capsule by mouth daily before breakfast.     lisinopril (ZESTRIL) 10 MG tablet Take 10 mg by mouth every morning.     meclizine (ANTIVERT) 25 MG tablet Take 25 mg by mouth every 4 (four) hours as needed.     MUCINEX 600 MG 12 hr tablet Take 600 mg by mouth 2 (two) times daily as needed for cough.      naproxen (NAPROSYN) 500 MG tablet Take 500 mg by mouth as needed.     Nystatin (GERHARDT'S BUTT CREAM) CREA Apply 1 application topically 3 (three) times daily as needed for irritation. 1 each 11   nystatin ointment (MYCOSTATIN) Apply 1 application topically 2 (two) times daily as needed (for irritation).     omeprazole (PRILOSEC) 20 MG capsule Take 20 mg by mouth daily.     ondansetron (ZOFRAN) 4 MG tablet Take 4 mg by mouth every 8 (eight) hours as needed for nausea or vomiting.     sertraline (ZOLOFT) 100 MG tablet Take 100 mg by mouth daily.     traZODone (DESYREL) 50 MG tablet Take 50 mg by mouth at bedtime.     No current facility-administered medications for this visit.    Allergies as of 12/06/2020 - Review Complete 12/06/2020  Allergen Reaction Noted   Novocain [procaine hcl] Other (See Comments) 11/20/2011   Other Hives and Other (See Comments) 11/20/2011   Latex Rash 07/22/2013   Penicillins Rash and Other (See Comments) 11/20/2011   Sulfa antibiotics Rash 11/20/2011    Family History  Problem Relation Age of Onset   Colon cancer Brother        IN HIS 60s-METASTATIC   Colon polyps Paternal Grandfather    Breast cancer Mother    Hypertension Father    Heart disease Father    Hypertension  Brother    Heart disease Brother     Social History   Socioeconomic History   Marital status: Single    Spouse name: Not  on file   Number of children: Not on file   Years of education: Not on file   Highest education level: Not on file  Occupational History   Not on file  Tobacco Use   Smoking status: Never   Smokeless tobacco: Never  Vaping Use   Vaping Use: Never used  Substance and Sexual Activity   Alcohol use: No   Drug use: No   Sexual activity: Yes    Birth control/protection: Surgical  Other Topics Concern   Not on file  Social History Narrative   Not on file   Social Determinants of Health   Financial Resource Strain: Not on file  Food Insecurity: Not on file  Transportation Needs: Not on file  Physical Activity: Not on file  Stress: Not on file  Social Connections: Not on file   Review of Systems: Gen: Denies fever, chills, anorexia. Denies fatigue, weakness, weight loss.  CV: Denies chest pain, palpitations, syncope, peripheral edema, and claudication. Resp: Denies dyspnea at rest, cough, wheezing, coughing up blood, and pleurisy. GI: Denies melena, hematochezia, nausea, vomiting, dysphagia, odyonophagia, early satiety or weight loss.  Derm: +shingles to R side/abdomen Psych: Denies depression, anxiety, memory loss, confusion. No homicidal or suicidal ideation.  Heme: Denies bruising, bleeding, and enlarged lymph nodes.  Physical Exam: BP 96/65 (BP Location: Left Arm, Patient Position: Sitting, Cuff Size: Large)   Pulse 84   Temp 98.4 F (36.9 C) (Oral)   Ht 5\' 3"  (1.6 m)   Wt 222 lb 14.4 oz (101.1 kg)   BMI 39.48 kg/m  General:   Alert and oriented. No distress noted. Pleasant and cooperative.  Head:  Normocephalic and atraumatic. Eyes:  Conjuctiva clear without scleral icterus. Mouth:  Oral mucosa pink and moist. Good dentition. No lesions. Heart: Normal rate and rhythm, s1 and s2 heart sounds present.  Lungs: Clear lung sounds in all lobes.  Respirations equal and unlabored. Abdomen:  +BS, soft, non-distended. No rebound or guarding. No HSM or masses noted. Mild TTP/soreness near umbilicus Derm: No palmar erythema or jaundice Msk:  Symmetrical without gross deformities. Normal posture. Extremities:  Without edema. Neurologic:  Alert and  oriented x4 Psych:  Alert and cooperative. Normal mood and affect.  Invalid input(s): 6 MONTHS   ASSESSMENT: [name] [name] is a 80 y.o. female presenting today for follow up of GERD, IDA and IBS.  IBS appears to be mixed,as she will have some episodes of diarrhea with associated abdominal cramping at times, then will go a few days with no BM, takes miralax or stool softener as needed when constipation occurs. She takes her dicyclomine on occasion which helps with her episodes of diarrhea or occasional abdominal cramping. Trial miralax 1/2 capful every day to every other day to help maintain more regular bowel movements, can continue bentyl as needed.   Mid to lower abdominal soreness, likely related to recent shingles. She will let us know if this does not resolve.  Hemoglobin stable at 14.5 as of July 2022, currently on ferrous sulfate (slowfe) once daily. She denies sob or dizziness, or fatigue. Denies blood in stools or black stools. She has PCP appointment where she will have labs done next month. Continue daily iron supplementation.   GERD is well controlled on Omeprazole 20mg  daily, we will continue with current PPI regimen.  No red flag symptoms. Patient denies melena, hematochezia, nausea, vomiting,  dysphagia, odyonophagia, early satiety or weight loss.    PLAN:  Continue dicyclomine prn up to twice  daily 2. Can try miralax 1/2 capful every day to every other day to help have more regular BMs 3. Continue Omeprazole 20mg  once daily 4. Will obtain labs from PCP after visit with them next month   Follow Up: 6 months  Starlit Raburn L. Alver Sorrow, MSN, APRN, AGNP-C Adult-Gerontology Nurse  Practitioner [facility]

## 2020-12-06 NOTE — Patient Instructions (Signed)
Continue taking your omeprazole 20mg  once daily in the mornings, 30-45 minutes before breakfast  Continue your iron pills once daily  You can use dicyclomine as needed,up to twice daily for diarrhea/abdominal cramping.  You can try doing 1/2 capful of miralax every day to every other day to see if this helps keep your BMs more regular and avoid episodes of constipation.  Please let us know if you lower abdominal pain does not get better, this is likely related to your shinlges.  Follow up in 6 months

## 2020-12-13 DIAGNOSIS — R011 Cardiac murmur, unspecified: Secondary | ICD-10-CM | POA: Diagnosis not present

## 2020-12-13 DIAGNOSIS — I080 Rheumatic disorders of both mitral and aortic valves: Secondary | ICD-10-CM | POA: Diagnosis not present

## 2020-12-13 DIAGNOSIS — I5032 Chronic diastolic (congestive) heart failure: Secondary | ICD-10-CM | POA: Diagnosis not present

## 2021-01-29 DIAGNOSIS — R935 Abnormal findings on diagnostic imaging of other abdominal regions, including retroperitoneum: Secondary | ICD-10-CM | POA: Diagnosis not present

## 2021-01-29 DIAGNOSIS — M25561 Pain in right knee: Secondary | ICD-10-CM | POA: Diagnosis not present

## 2021-01-29 DIAGNOSIS — G319 Degenerative disease of nervous system, unspecified: Secondary | ICD-10-CM | POA: Diagnosis not present

## 2021-01-29 DIAGNOSIS — I10 Essential (primary) hypertension: Secondary | ICD-10-CM | POA: Diagnosis not present

## 2021-01-29 DIAGNOSIS — N189 Chronic kidney disease, unspecified: Secondary | ICD-10-CM | POA: Diagnosis not present

## 2021-01-29 DIAGNOSIS — Z96642 Presence of left artificial hip joint: Secondary | ICD-10-CM | POA: Diagnosis not present

## 2021-01-29 DIAGNOSIS — M25461 Effusion, right knee: Secondary | ICD-10-CM | POA: Diagnosis not present

## 2021-01-29 DIAGNOSIS — R52 Pain, unspecified: Secondary | ICD-10-CM | POA: Diagnosis not present

## 2021-01-29 DIAGNOSIS — K5730 Diverticulosis of large intestine without perforation or abscess without bleeding: Secondary | ICD-10-CM | POA: Diagnosis not present

## 2021-01-29 DIAGNOSIS — M47812 Spondylosis without myelopathy or radiculopathy, cervical region: Secondary | ICD-10-CM | POA: Diagnosis not present

## 2021-01-29 DIAGNOSIS — M4322 Fusion of spine, cervical region: Secondary | ICD-10-CM | POA: Diagnosis not present

## 2021-01-29 DIAGNOSIS — M25551 Pain in right hip: Secondary | ICD-10-CM | POA: Diagnosis not present

## 2021-01-29 DIAGNOSIS — W19XXXA Unspecified fall, initial encounter: Secondary | ICD-10-CM | POA: Diagnosis not present

## 2021-01-29 DIAGNOSIS — W1839XA Other fall on same level, initial encounter: Secondary | ICD-10-CM | POA: Diagnosis not present

## 2021-01-29 DIAGNOSIS — Z9884 Bariatric surgery status: Secondary | ICD-10-CM | POA: Diagnosis not present

## 2021-01-29 DIAGNOSIS — M25562 Pain in left knee: Secondary | ICD-10-CM | POA: Diagnosis not present

## 2021-01-29 DIAGNOSIS — K449 Diaphragmatic hernia without obstruction or gangrene: Secondary | ICD-10-CM | POA: Diagnosis not present

## 2021-01-29 DIAGNOSIS — M25552 Pain in left hip: Secondary | ICD-10-CM | POA: Diagnosis not present

## 2021-01-29 DIAGNOSIS — I700 Atherosclerosis of aorta: Secondary | ICD-10-CM | POA: Diagnosis not present

## 2021-01-29 DIAGNOSIS — I129 Hypertensive chronic kidney disease with stage 1 through stage 4 chronic kidney disease, or unspecified chronic kidney disease: Secondary | ICD-10-CM | POA: Diagnosis not present

## 2021-01-29 DIAGNOSIS — S0990XA Unspecified injury of head, initial encounter: Secondary | ICD-10-CM | POA: Diagnosis not present

## 2021-01-29 DIAGNOSIS — I2510 Atherosclerotic heart disease of native coronary artery without angina pectoris: Secondary | ICD-10-CM | POA: Diagnosis not present

## 2021-01-29 DIAGNOSIS — M810 Age-related osteoporosis without current pathological fracture: Secondary | ICD-10-CM | POA: Diagnosis not present

## 2021-01-29 DIAGNOSIS — M47813 Spondylosis without myelopathy or radiculopathy, cervicothoracic region: Secondary | ICD-10-CM | POA: Diagnosis not present

## 2021-01-29 DIAGNOSIS — Z981 Arthrodesis status: Secondary | ICD-10-CM | POA: Diagnosis not present

## 2021-01-29 DIAGNOSIS — K5790 Diverticulosis of intestine, part unspecified, without perforation or abscess without bleeding: Secondary | ICD-10-CM | POA: Diagnosis not present

## 2021-01-29 DIAGNOSIS — M19012 Primary osteoarthritis, left shoulder: Secondary | ICD-10-CM | POA: Diagnosis not present

## 2021-01-29 DIAGNOSIS — I959 Hypotension, unspecified: Secondary | ICD-10-CM | POA: Diagnosis not present

## 2021-01-29 DIAGNOSIS — M25519 Pain in unspecified shoulder: Secondary | ICD-10-CM | POA: Diagnosis not present

## 2021-01-29 DIAGNOSIS — E1122 Type 2 diabetes mellitus with diabetic chronic kidney disease: Secondary | ICD-10-CM | POA: Diagnosis not present

## 2021-01-29 DIAGNOSIS — Z043 Encounter for examination and observation following other accident: Secondary | ICD-10-CM | POA: Diagnosis not present

## 2021-01-31 DIAGNOSIS — I10 Essential (primary) hypertension: Secondary | ICD-10-CM | POA: Diagnosis not present

## 2021-01-31 DIAGNOSIS — N189 Chronic kidney disease, unspecified: Secondary | ICD-10-CM | POA: Diagnosis not present

## 2021-02-01 DIAGNOSIS — I130 Hypertensive heart and chronic kidney disease with heart failure and stage 1 through stage 4 chronic kidney disease, or unspecified chronic kidney disease: Secondary | ICD-10-CM | POA: Diagnosis not present

## 2021-02-01 DIAGNOSIS — N182 Chronic kidney disease, stage 2 (mild): Secondary | ICD-10-CM | POA: Diagnosis not present

## 2021-02-01 DIAGNOSIS — I5032 Chronic diastolic (congestive) heart failure: Secondary | ICD-10-CM | POA: Diagnosis not present

## 2021-02-03 DIAGNOSIS — M5416 Radiculopathy, lumbar region: Secondary | ICD-10-CM | POA: Diagnosis not present

## 2021-02-03 DIAGNOSIS — Z23 Encounter for immunization: Secondary | ICD-10-CM | POA: Diagnosis not present

## 2021-02-03 DIAGNOSIS — Z96652 Presence of left artificial knee joint: Secondary | ICD-10-CM | POA: Diagnosis not present

## 2021-02-03 DIAGNOSIS — D734 Cyst of spleen: Secondary | ICD-10-CM | POA: Diagnosis not present

## 2021-02-03 DIAGNOSIS — I10 Essential (primary) hypertension: Secondary | ICD-10-CM | POA: Diagnosis not present

## 2021-02-03 DIAGNOSIS — M159 Polyosteoarthritis, unspecified: Secondary | ICD-10-CM | POA: Diagnosis not present

## 2021-02-03 DIAGNOSIS — R296 Repeated falls: Secondary | ICD-10-CM | POA: Diagnosis not present

## 2021-02-03 DIAGNOSIS — Z96651 Presence of right artificial knee joint: Secondary | ICD-10-CM | POA: Diagnosis not present

## 2021-02-07 DIAGNOSIS — D734 Cyst of spleen: Secondary | ICD-10-CM | POA: Diagnosis not present

## 2021-02-14 DIAGNOSIS — H353110 Nonexudative age-related macular degeneration, right eye, stage unspecified: Secondary | ICD-10-CM | POA: Diagnosis not present

## 2021-02-14 DIAGNOSIS — H353220 Exudative age-related macular degeneration, left eye, stage unspecified: Secondary | ICD-10-CM | POA: Diagnosis not present

## 2021-02-14 DIAGNOSIS — Z961 Presence of intraocular lens: Secondary | ICD-10-CM | POA: Diagnosis not present

## 2021-02-15 DIAGNOSIS — H43813 Vitreous degeneration, bilateral: Secondary | ICD-10-CM | POA: Diagnosis not present

## 2021-02-15 DIAGNOSIS — H353112 Nonexudative age-related macular degeneration, right eye, intermediate dry stage: Secondary | ICD-10-CM | POA: Diagnosis not present

## 2021-02-15 DIAGNOSIS — H353221 Exudative age-related macular degeneration, left eye, with active choroidal neovascularization: Secondary | ICD-10-CM | POA: Diagnosis not present

## 2021-02-21 DIAGNOSIS — H353221 Exudative age-related macular degeneration, left eye, with active choroidal neovascularization: Secondary | ICD-10-CM | POA: Diagnosis not present

## 2021-03-21 DIAGNOSIS — H353221 Exudative age-related macular degeneration, left eye, with active choroidal neovascularization: Secondary | ICD-10-CM | POA: Diagnosis not present

## 2021-03-21 DIAGNOSIS — H43813 Vitreous degeneration, bilateral: Secondary | ICD-10-CM | POA: Diagnosis not present

## 2021-03-21 DIAGNOSIS — H35433 Paving stone degeneration of retina, bilateral: Secondary | ICD-10-CM | POA: Diagnosis not present

## 2021-03-21 DIAGNOSIS — H353112 Nonexudative age-related macular degeneration, right eye, intermediate dry stage: Secondary | ICD-10-CM | POA: Diagnosis not present

## 2021-03-22 DIAGNOSIS — I999 Unspecified disorder of circulatory system: Secondary | ICD-10-CM | POA: Diagnosis not present

## 2021-03-22 DIAGNOSIS — N281 Cyst of kidney, acquired: Secondary | ICD-10-CM | POA: Diagnosis not present

## 2021-03-22 DIAGNOSIS — D499 Neoplasm of unspecified behavior of unspecified site: Secondary | ICD-10-CM | POA: Diagnosis not present

## 2021-03-22 DIAGNOSIS — Z9049 Acquired absence of other specified parts of digestive tract: Secondary | ICD-10-CM | POA: Diagnosis not present

## 2021-03-22 DIAGNOSIS — D7389 Other diseases of spleen: Secondary | ICD-10-CM | POA: Diagnosis not present

## 2021-03-22 DIAGNOSIS — T148XXA Other injury of unspecified body region, initial encounter: Secondary | ICD-10-CM | POA: Diagnosis not present

## 2021-04-03 ENCOUNTER — Ambulatory Visit (INDEPENDENT_AMBULATORY_CARE_PROVIDER_SITE_OTHER): Payer: Medicare PPO | Admitting: Gastroenterology

## 2021-04-21 DIAGNOSIS — H353112 Nonexudative age-related macular degeneration, right eye, intermediate dry stage: Secondary | ICD-10-CM | POA: Diagnosis not present

## 2021-04-21 DIAGNOSIS — H35433 Paving stone degeneration of retina, bilateral: Secondary | ICD-10-CM | POA: Diagnosis not present

## 2021-04-21 DIAGNOSIS — H353221 Exudative age-related macular degeneration, left eye, with active choroidal neovascularization: Secondary | ICD-10-CM | POA: Diagnosis not present

## 2021-04-21 DIAGNOSIS — H43813 Vitreous degeneration, bilateral: Secondary | ICD-10-CM | POA: Diagnosis not present

## 2021-05-03 DIAGNOSIS — E876 Hypokalemia: Secondary | ICD-10-CM | POA: Diagnosis not present

## 2021-05-03 DIAGNOSIS — R5382 Chronic fatigue, unspecified: Secondary | ICD-10-CM | POA: Diagnosis not present

## 2021-05-03 DIAGNOSIS — R739 Hyperglycemia, unspecified: Secondary | ICD-10-CM | POA: Diagnosis not present

## 2021-05-03 DIAGNOSIS — N189 Chronic kidney disease, unspecified: Secondary | ICD-10-CM | POA: Diagnosis not present

## 2021-05-03 DIAGNOSIS — K219 Gastro-esophageal reflux disease without esophagitis: Secondary | ICD-10-CM | POA: Diagnosis not present

## 2021-05-03 DIAGNOSIS — I10 Essential (primary) hypertension: Secondary | ICD-10-CM | POA: Diagnosis not present

## 2021-05-03 DIAGNOSIS — Z1329 Encounter for screening for other suspected endocrine disorder: Secondary | ICD-10-CM | POA: Diagnosis not present

## 2021-05-03 DIAGNOSIS — Z13220 Encounter for screening for lipoid disorders: Secondary | ICD-10-CM | POA: Diagnosis not present

## 2021-05-03 DIAGNOSIS — E559 Vitamin D deficiency, unspecified: Secondary | ICD-10-CM | POA: Diagnosis not present

## 2021-05-03 DIAGNOSIS — D649 Anemia, unspecified: Secondary | ICD-10-CM | POA: Diagnosis not present

## 2021-05-08 DIAGNOSIS — R011 Cardiac murmur, unspecified: Secondary | ICD-10-CM | POA: Diagnosis not present

## 2021-05-08 DIAGNOSIS — Z981 Arthrodesis status: Secondary | ICD-10-CM | POA: Diagnosis not present

## 2021-05-08 DIAGNOSIS — K644 Residual hemorrhoidal skin tags: Secondary | ICD-10-CM | POA: Diagnosis not present

## 2021-05-08 DIAGNOSIS — Z1331 Encounter for screening for depression: Secondary | ICD-10-CM | POA: Diagnosis not present

## 2021-05-08 DIAGNOSIS — D696 Thrombocytopenia, unspecified: Secondary | ICD-10-CM | POA: Diagnosis not present

## 2021-05-08 DIAGNOSIS — Z1389 Encounter for screening for other disorder: Secondary | ICD-10-CM | POA: Diagnosis not present

## 2021-05-08 DIAGNOSIS — I10 Essential (primary) hypertension: Secondary | ICD-10-CM | POA: Diagnosis not present

## 2021-05-08 DIAGNOSIS — I5032 Chronic diastolic (congestive) heart failure: Secondary | ICD-10-CM | POA: Diagnosis not present

## 2021-05-22 ENCOUNTER — Ambulatory Visit (INDEPENDENT_AMBULATORY_CARE_PROVIDER_SITE_OTHER): Payer: Medicare PPO

## 2021-05-22 ENCOUNTER — Other Ambulatory Visit: Payer: Self-pay

## 2021-05-22 ENCOUNTER — Ambulatory Visit: Payer: Medicare PPO | Admitting: Orthopedic Surgery

## 2021-05-22 DIAGNOSIS — M79671 Pain in right foot: Secondary | ICD-10-CM | POA: Diagnosis not present

## 2021-05-22 DIAGNOSIS — H353221 Exudative age-related macular degeneration, left eye, with active choroidal neovascularization: Secondary | ICD-10-CM | POA: Diagnosis not present

## 2021-05-22 DIAGNOSIS — M205X1 Other deformities of toe(s) (acquired), right foot: Secondary | ICD-10-CM | POA: Diagnosis not present

## 2021-05-22 DIAGNOSIS — H353112 Nonexudative age-related macular degeneration, right eye, intermediate dry stage: Secondary | ICD-10-CM | POA: Diagnosis not present

## 2021-05-28 ENCOUNTER — Encounter: Payer: Self-pay | Admitting: Orthopedic Surgery

## 2021-05-28 NOTE — Progress Notes (Signed)
? ?Office Visit Note ?  ?Patient: [name] [name]           ?Date of Birth: 1940-08-18           ?MRN: [id number] ?Visit Date: 05/22/2021 ?             ?Requested by: Curlene Labrum, MD ?[address] ?[place],  [place] [address] ?PCP: Curlene Labrum, MD ? ?Chief Complaint  ?Patient presents with  ? Right Foot - Pain  ?  Hammer toes  ? Left Foot - Pain  ?  Hammer toes  ? ? ? ? ?HPI: ?Patient is an 81 year old woman who presents complaining of persistent forefoot pain.  She has previously undergone Weil osteotomies for the second and third metatarsal with Dr. Berline Lopes.  Patient states she has had progressive deformities to her foot with overlapping of the toes ? ?Assessment & Plan: ?Visit Diagnoses:  ?1. Pain in right foot   ?2. Claw toe, acquired, right   ? ? ?Plan: Due to the progressive deformities and patient states that she is suffering with activities of daily living discussed that we could proceed with surgical intervention which would include a Weil osteotomy for the second and third metatarsals gastrocnemius recession and PIP resection.  Risks and benefits were discussed including risk of the wound not healing need for additional surgery.  Patient states she understands wished to proceed at this time. ? ?Follow-Up Instructions: No follow-ups on file.  ? ?Ortho Exam ? ?Patient is alert, oriented, no adenopathy, well-dressed, normal affect, normal respiratory effort. ?Examination patient has venous insufficiency with varicose veins in both lower extremities.  I cannot palpate a pulse however the Doppler was used and she has a biphasic anterior tibial pulse with occasional pauses.  She has pain to palpation beneath the second and third metatarsal head she has Achilles contracture with dorsiflexion only to neutral.  She has fixed clawing of the second and third toes with the second toe on top of the third toe.  Patient has a 20 degree equinus contracture with her knee extended. ? ?Imaging: ?No results found. ?No images  are attached to the encounter. ? ?Labs: ?Lab Results  ?Component Value Date  ? ESRSEDRATE 6 09/08/2020  ? ESRSEDRATE 3 10/11/2016  ? LABURIC 5.7 09/08/2020  ? REPTSTATUS 07/19/2015 FINAL 07/14/2015  ? REPTSTATUS 07/19/2015 FINAL 07/14/2015  ? CULT NO GROWTH 5 DAYS 07/14/2015  ? CULT NO GROWTH 5 DAYS 07/14/2015  ? LABORGA ACINETOBACTER LWOFFII 12/28/2013  ? ? ? ?Lab Results  ?Component Value Date  ? ALBUMIN 4.0 09/02/2018  ? ALBUMIN 3.9 08/05/2017  ? ALBUMIN 3.9 08/03/2017  ? ? ?Lab Results  ?Component Value Date  ? MG 1.6 (L) 02/05/2017  ? MG 2.4 12/29/2013  ? MG 1.4 (L) 12/28/2013  ? ?No results found for: VD25OH ? ?No results found for: PREALBUMIN ? ?  Latest Ref Rng & Units 09/08/2020  ?  4:50 PM 04/15/2020  ?  2:15 PM 10/08/2019  ? 11:40 AM  ?CBC EXTENDED  ?WBC 3.8 - 10.8 Thousand/uL 4.9   4.4   5.0    ?RBC 3.80 - 5.10 Million/uL 4.93   4.68   4.59    ?Hemoglobin 11.7 - 15.5 g/dL 14.5   13.8   11.8    ?HCT 35.0 - 45.0 % 44.0   42.4   37.3    ?Platelets 140 - 400 Thousand/uL 152   133   182    ?NEUT# 1,500 - 7,800 cells/uL  3,136   2,834   3,300    ?Lymph# 850 - 3,900 cells/uL 789   805   835    ? ? ? ?There is no height or weight on file to calculate BMI. ? ?Orders:  ?Orders Placed This Encounter  ?Procedures  ? XR Foot Complete Right  ? ?No orders of the defined types were placed in this encounter. ? ? ? Procedures: ?No procedures performed ? ?Clinical Data: ?No additional findings. ? ?ROS: ? ?All other systems negative, except as noted in the HPI. ?Review of Systems ? ?Objective: ?Vital Signs: There were no vitals taken for this visit. ? ?Specialty Comments:  ?No specialty comments available. ? ?PMFS History: ?Patient Active Problem List  ? Diagnosis Date Noted  ? Pes anserinus bursitis of right knee 08/25/2020  ? Pancreatic cyst 04/14/2020  ? Irritable bowel syndrome with both constipation and diarrhea 04/14/2020  ? Iron deficiency anemia 04/14/2020  ? Impingement syndrome of left shoulder 02/06/2019  ? S/P total  knee arthroplasty, 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  ? 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 History of arthroplasty of right shoulder 08/14/2018  ? History of colonic polyps 08/14/2017  ? Choledocholithiasis   ? Abnormal CT scan, esophagus   ? Cholestasis 02/05/2017  ? Depression 02/05/2017  ? Hypomagnesemia 02/05/2017  ? Bradycardia 02/05/2017  ? Transaminitis 07/08/2016  ? Constipation 07/08/2016  ? GIB (gastrointestinal bleeding) 07/08/2016  ? HCAP (healthcare-associated pneumonia) 09/23/2015  ? AKI (acute kidney injury) ([place]) 07/21/2015  ? Hypokalemia 07/21/2015  ? CAP (community acquired pneumonia) 07/14/2015  ? Upper abdominal pain 06/10/2014  ? Folliculitis of perineum 02/24/2014  ? Giant comedone 12/21/2013  ? Sebaceous gland hyperplasia of vulva 11/25/2013  ? Acrochordon 11/25/2013  ? Dysphagia 10/29/2013  ? Class 2 severe obesity due to excess calories with serious comorbidity and body mass index (BMI) of 38.0 to 38.9 in adult [facility]) 10/02/2013  ? Primary hypertension   ? GERD (gastroesophageal reflux disease)   ? Anxiety   ? Anemia of chronic disease   ? ?Past Medical History:  ?Diagnosis Date  ? Anemia   ? Anxiety   ? Arthritis   ? Barrett's esophagus   ? seen on 2016 egd at [facility]  ? Depression   ? GERD (gastroesophageal reflux disease)   ? H/O hiatal hernia   ? Hypertension   ? Obesity   ? Pneumonia   ? PONV (postoperative nausea and vomiting)   ? Seasonal allergies   ? Sepsis ([place]) 10/02/2013  ?  ?Family History  ?Problem Relation Age of Onset  ? Colon cancer Brother   ?     IN HIS 60s-METASTATIC  ? Colon polyps Paternal Grandfather   ? Breast cancer Mother   ? Hypertension Father   ? Heart disease Father   ? Hypertension Brother   ? Heart disease Brother   ?  ?Past Surgical History:  ?Procedure Laterality Date  ? ABDOMINAL HYSTERECTOMY    ? ABDOMINAL SURGERY    ? APPENDECTOMY    ? BACK SURGERY    ? lumbar x2 by Dr Arnoldo Morale  ? BIOPSY  09/03/2019  ? Procedure: BIOPSY;  Surgeon: Rogene Houston, MD;   [facility];  Service: Endoscopy;;  esophagus  ? CARPAL TUNNEL RELEASE Right 03/2014  ? Dr. Lorin Mercy  ? CATARACT EXTRACTION W/PHACO Left 08/06/2013  ? Procedure: CATARACT EXTRACTION PHACO AND INTRAOCULAR LENS PLACEMENT LEFT EYE;  Surgeon: Tonny Branch, MD;  [facility];  Service: Ophthalmology;  Laterality: Left;  CDE: 9.55  ? CATARACT EXTRACTION W/PHACO Right 08/24/2013  ? Procedure: CATARACT EXTRACTION PHACO AND INTRAOCULAR LENS PLACEMENT (IOC);  Surgeon: Tonny Branch, MD;  Location: [facility];  Service: Ophthalmology;  Laterality: Right;  CDE:8.30  ? CERVICAL FUSION    ? CHOLECYSTECTOMY    ? COLONOSCOPY N/A 11/03/2013  ? SLF: 1. Normal mucosa in the terminal ileum. 2. 13 colon polyps removed. 3. Moderate diverticulosis in the descending colon and sigmoid colon 4. the left colon is redundant 5. Small internal  hemorrhoids.  ? COLONOSCOPY N/A 09/25/2017  ? Procedure: COLONOSCOPY;  Surgeon: Rogene Houston, MD;  [facility];  Service: Endoscopy;  Laterality: N/A;  1:55  ? COLONOSCOPY WITH PROPOFOL N/A 04/29/2020  ? [facility]: multiple polyps (tubular adenoma, sessile serrated, hyperplastic) ascending colon, transverse colon, descending colon, diverticulsos in sigmoid and descending colon, rectum/anal vergse normal  ? ESOPHAGOGASTRODUODENOSCOPY N/A 11/03/2013  ? SLF: 1. 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 Mild Non-erosive gastritis  ? ESOPHAGOGASTRODUODENOSCOPY  10/2014  ? [facility]: IMPRESSIONS: - likely small distal esophageal diverticulum without evidence of fistula, +barrett's esophagus without dysplasia. s/p gastric bypass  ? ESOPHAGOGASTRODUODENOSCOPY (EGD) WITH PROPOFOL N/A 09/03/2019  ? rehman: normal hypopharynx, esophagus, mid esophagus, diverticulum in distal esophagus, short segment barretts esophagus, gastric bypass with small sized pouch, gastrojejunal anastomosis with healthy appearing mucosa, normal gastric body and jejunum   ? EYE SURGERY    ? GASTRIC BYPASS  1979  ? revision in 1980   ? HIATAL HERNIA REPAIR    ? [facility] in 2011  ? JOINT REPLACEMENT Right 1995  ? hip  ? JOINT REPLACEMENT Left 2008  ? hip  ? MEDIAL PARTIAL KNEE

## 2021-05-31 DIAGNOSIS — L570 Actinic keratosis: Secondary | ICD-10-CM | POA: Diagnosis not present

## 2021-05-31 DIAGNOSIS — D485 Neoplasm of uncertain behavior of skin: Secondary | ICD-10-CM | POA: Diagnosis not present

## 2021-06-08 ENCOUNTER — Ambulatory Visit (INDEPENDENT_AMBULATORY_CARE_PROVIDER_SITE_OTHER): Payer: Medicare PPO | Admitting: Gastroenterology

## 2021-06-08 ENCOUNTER — Encounter (INDEPENDENT_AMBULATORY_CARE_PROVIDER_SITE_OTHER): Payer: Self-pay | Admitting: Gastroenterology

## 2021-06-26 DIAGNOSIS — H353112 Nonexudative age-related macular degeneration, right eye, intermediate dry stage: Secondary | ICD-10-CM | POA: Diagnosis not present

## 2021-06-26 DIAGNOSIS — H43813 Vitreous degeneration, bilateral: Secondary | ICD-10-CM | POA: Diagnosis not present

## 2021-06-26 DIAGNOSIS — H35433 Paving stone degeneration of retina, bilateral: Secondary | ICD-10-CM | POA: Diagnosis not present

## 2021-06-26 DIAGNOSIS — H353221 Exudative age-related macular degeneration, left eye, with active choroidal neovascularization: Secondary | ICD-10-CM | POA: Diagnosis not present

## 2021-06-29 ENCOUNTER — Encounter (INDEPENDENT_AMBULATORY_CARE_PROVIDER_SITE_OTHER): Payer: Self-pay | Admitting: Gastroenterology

## 2021-06-29 ENCOUNTER — Ambulatory Visit (INDEPENDENT_AMBULATORY_CARE_PROVIDER_SITE_OTHER): Payer: Medicare PPO | Admitting: Gastroenterology

## 2021-06-29 VITALS — BP 139/82 | HR 69 | Temp 98.3°F | Ht 63.0 in | Wt 221.8 lb

## 2021-06-29 DIAGNOSIS — K582 Mixed irritable bowel syndrome: Secondary | ICD-10-CM | POA: Diagnosis not present

## 2021-06-29 DIAGNOSIS — K5900 Constipation, unspecified: Secondary | ICD-10-CM

## 2021-06-29 DIAGNOSIS — R1010 Upper abdominal pain, unspecified: Secondary | ICD-10-CM | POA: Diagnosis not present

## 2021-06-29 NOTE — Progress Notes (Signed)
Maylon Peppers, M.D. ?Gastroenterology & Hepatology ?[facility] ?[address] ?[place], [place] [address] ? ?Primary Care Physician: ?Curlene Labrum, MD ?[address] ?[place] [address] ? ?I will communicate my assessment and recommendations to the referring MD via EMR. ? ?Problems: ?IBS-C ?Abdominal pain ?Benign pancreatic cyst ? ?History of Present Illness: ?[name] [name] is a 81 y.o. female with past medical history of IBS-C, IDA, Gerd, Barrett's esophagus, anxiety, depression, HTN, macular degeneration, coming for evaluation of constipation and abdominal pain. ? ?The patient was last seen on 12/06/2020. At that time, the patient was given dicyclomine as needed abdominal pain episodes.  She was advised to diet MiraLAX half a capful every other day. ? ?Patient reports that some days she has presented some episodes of constipation. States most of the days she has a bowel movement. She takes 1/2 Miralax capful every day, but sometimes she takes a full capful. However sometimes she can last 4-5 days without a bowel movement, at which time she takes docusate, which sometimes helps. Has also presented some bloating sensation. After having a bowel movement she feels better  ? ?She has also presented some "burning sensation" for the last few months. She did not feel any improvement when taking dicyclomine, in fact she had nausea with the medication. States the pain has been present for 2-3 months. ? ?The patient denies having any nausea, vomiting, fever, chills, hematochezia, melena, hematemesis, diarrhea, jaundice, pruritus or weight loss. ? ?Has been taking omeprazole 20 mg twice a day. Feels it may have helped for her burning sensation. ? ?Last Colonoscopy:2/25/22Two 3 to 5 mm polyps in the cecum,  ?- Three 3 to 6 mm polyps in the ascending colon,  ?- Four 4 to 8 mm polyps in the transverse colon,  ?- Seven 3 to 9 mm polyps in the descending colon,  ?-  Diverticulosis in the sigmoid colon and in the descending colon. ?- The distal rectum and anal verge are normal on retroflexion view. ?(Tubular adenomas, sessile serrated polyps, hyperplastic polyps) no repeat recommended  ?  ?Last Endoscopy:09/03/19- Normal hypopharynx. ?- Normal proximal esophagus and mid esophagus. ?- Diverticulum in the distal esophagus. ?- Esophageal mucosal changes secondary to established short-segment Barrett's disease.(Confirmed BE, short segment) ?- Z-line regular, 34 cm from the incisors. ?- Gastric bypass with a small-sized pouch and intact staple line. Gastrojejunal anastomosis characterized by healthy appearing mucosa.- Normal gastric body. ?- Normal examined jejunum. ? ?Past Medical History: ?Past Medical History:  ?Diagnosis Date  ? Anemia   ? Anxiety   ? Arthritis   ? Barrett's esophagus   ? seen on 2016 egd at [facility]  ? Depression   ? GERD (gastroesophageal reflux disease)   ? H/O hiatal hernia   ? Hypertension   ? Obesity   ? Pneumonia   ? PONV (postoperative nausea and vomiting)   ? Seasonal allergies   ? Sepsis ([place]) 10/02/2013  ? ? ?Past Surgical History: ?Past Surgical History:  ?Procedure Laterality Date  ? ABDOMINAL HYSTERECTOMY    ? ABDOMINAL SURGERY    ? APPENDECTOMY    ? BACK SURGERY    ? lumbar x2 by Dr Arnoldo Morale  ? BIOPSY  09/03/2019  ? Procedure: BIOPSY;  Surgeon: Rogene Houston, MD;  [facility];  Service: Endoscopy;;  esophagus  ? CARPAL TUNNEL RELEASE Right 03/2014  ? Dr. Lorin Mercy  ? CATARACT EXTRACTION W/PHACO Left 08/06/2013  ? Procedure: CATARACT EXTRACTION PHACO AND INTRAOCULAR  LENS PLACEMENT LEFT EYE;  Surgeon: Tonny Branch, MD;  Location: [facility];  Service: Ophthalmology;  Laterality: Left;  CDE: 9.55  ? CATARACT EXTRACTION W/PHACO Right 08/24/2013  ? Procedure: CATARACT EXTRACTION PHACO AND INTRAOCULAR LENS PLACEMENT (IOC);  Surgeon: Tonny Branch, MD;  Location: [facility];  Service: Ophthalmology;  Laterality: Right;  CDE:8.30  ? CERVICAL FUSION    ?  CHOLECYSTECTOMY    ? COLONOSCOPY N/A 11/03/2013  ? SLF: 1. Normal mucosa in the terminal ileum. 2. 13 colon polyps removed. 3. Moderate diverticulosis in the descending colon and sigmoid colon 4. the left colon is redundant 5. Small internal  hemorrhoids.  ? COLONOSCOPY N/A 09/25/2017  ? Procedure: COLONOSCOPY;  Surgeon: Rogene Houston, MD;  [facility];  Service: Endoscopy;  Laterality: N/A;  1:55  ? COLONOSCOPY WITH PROPOFOL N/A 04/29/2020  ? Castaneda: multiple polyps (tubular adenoma, sessile serrated, hyperplastic) ascending colon, transverse colon, descending colon, diverticulsos in sigmoid and descending colon, rectum/anal vergse normal  ? ESOPHAGOGASTRODUODENOSCOPY N/A 11/03/2013  ? SLF: 1. 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 Mild Non-erosive gastritis  ? ESOPHAGOGASTRODUODENOSCOPY  10/2014  ? [facility]: IMPRESSIONS: - likely small distal esophageal diverticulum without evidence of fistula, +barrett's esophagus without dysplasia. s/p gastric bypass  ? ESOPHAGOGASTRODUODENOSCOPY (EGD) WITH PROPOFOL N/A 09/03/2019  ? rehman: normal hypopharynx, esophagus, mid esophagus, diverticulum in distal esophagus, short segment barretts esophagus, gastric bypass with small sized pouch, gastrojejunal anastomosis with healthy appearing mucosa, normal gastric body and jejunum  ? EYE SURGERY    ? GASTRIC BYPASS  1979  ? revision in 1980   ? HIATAL HERNIA REPAIR    ? [facility] in 2011  ? JOINT REPLACEMENT Right 1995  ? hip  ? JOINT REPLACEMENT Left 2008  ? hip  ? MEDIAL PARTIAL KNEE REPLACEMENT Left   ? Dr Ronnie Derby  ? PARAESOPHAGEAL HERNIA REPAIR  SEP 2010 DR. MCNATT  ? POLYPECTOMY  09/25/2017  ? Procedure: POLYPECTOMY;  Surgeon: Rogene Houston, MD;  [facility];  Service: Endoscopy;;  colon  ? POLYPECTOMY  04/29/2020  ? Procedure: POLYPECTOMY;  Surgeon: Harvel Quale, MD;  [facility];  Service: Gastroenterology;;  ?  SHOULDER ARTHROSCOPY Right   ? SHOULDER ARTHROSCOPY Right   ? x2  ? SKIN BIOPSY    ? TONSILLECTOMY    ? TOTAL KNEE ARTHROPLASTY Right 09/08/2018  ? Procedure: RIGHT TOTAL KNEE ARTHROPLASTY;  Surgeon: Marybelle Killings, MD;  Location: [facility];  Service: Orthopedics;  Laterality: Right;  ? TOTAL SHOULDER ARTHROPLASTY Right 02/16/2013  ? Procedure: TOTAL SHOULDER ARTHROPLASTY;  Surgeon: Marybelle Killings, MD;  Location: [facility];  Service: Orthopedics;  Laterality: Right;  Right Total Shoulder Arthroplasty, Cemented  ? ? ?Family History: ?Family History  ?Problem Relation Age of Onset  ? Colon cancer Brother   ?     IN HIS 60s-METASTATIC  ? Colon polyps Paternal Grandfather   ? Breast cancer Mother   ? Hypertension Father   ? Heart disease Father   ? Hypertension Brother   ? Heart disease Brother   ? ? ?Social History: ?Social History  ? ?Tobacco Use  ?Smoking Status Never  ?Smokeless Tobacco Never  ? ?Social History  ? ?Substance and Sexual Activity  ?Alcohol Use No  ? ?Social History  ? ?Substance and Sexual Activity  ?Drug Use No  ? ? ?Allergies: ?Allergies  ?Allergen Reactions  ? Novocain [Procaine Hcl] Other (See  Comments)  ?  Unknown-passed out with novocaine injected for dental surgery.  ? Other Hives and Other (See Comments)  ?  EKG pads - need to use pediatric pads  ? Latex Rash  ? Penicillins Rash and Other (See Comments)  ?  Has patient had a PCN reaction causing immediate rash, facial/tongue/throat swelling, SOB or lightheadedness with hypotension: Yes ?Has patient had a PCN reaction causing severe rash involving mucus membranes or skin necrosis: No ?Has patient had a PCN reaction that required hospitalization No ?Has patient had a PCN reaction occurring within the last 10 years: No  ?If all of the above answers are "NO", then may proceed with Cephalosporin use. ?  ? Sulfa Antibiotics Rash  ? ? ?Medications: ?Current Outpatient Medications  ?Medication Sig Dispense Refill  ? acetaminophen (TYLENOL) 500 MG tablet Take  1,000 mg by mouth every 6 (six) hours as needed for moderate pain or headache.    ? albuterol (PROVENTIL) (2.5 MG/3ML) 0.083% nebulizer solution Take 3 mLs (2.5 mg total) by nebulization every 4 (four) hours as needed for wheezin

## 2021-06-29 NOTE — Patient Instructions (Addendum)
Increase Miralax 1 capful every day for one week. If bowel movements do not improve, increase to 1 capful every 12 hours. If after two weeks there is no improvement, increase to 1 capful every 8 hours ?Schedule CT abdomen/pelvis with IV contrast ?Stop dicyclomine ?Can take IBGard as needed for  abdominal pain episodes ? ?

## 2021-07-02 DIAGNOSIS — I130 Hypertensive heart and chronic kidney disease with heart failure and stage 1 through stage 4 chronic kidney disease, or unspecified chronic kidney disease: Secondary | ICD-10-CM | POA: Diagnosis not present

## 2021-07-02 DIAGNOSIS — I5032 Chronic diastolic (congestive) heart failure: Secondary | ICD-10-CM | POA: Diagnosis not present

## 2021-07-02 DIAGNOSIS — N182 Chronic kidney disease, stage 2 (mild): Secondary | ICD-10-CM | POA: Diagnosis not present

## 2021-07-05 ENCOUNTER — Encounter (INDEPENDENT_AMBULATORY_CARE_PROVIDER_SITE_OTHER): Payer: Self-pay

## 2021-07-05 ENCOUNTER — Other Ambulatory Visit (INDEPENDENT_AMBULATORY_CARE_PROVIDER_SITE_OTHER): Payer: Self-pay

## 2021-07-05 DIAGNOSIS — Z01812 Encounter for preprocedural laboratory examination: Secondary | ICD-10-CM

## 2021-07-05 DIAGNOSIS — R1010 Upper abdominal pain, unspecified: Secondary | ICD-10-CM

## 2021-07-11 ENCOUNTER — Other Ambulatory Visit: Payer: Self-pay

## 2021-07-11 ENCOUNTER — Encounter (HOSPITAL_BASED_OUTPATIENT_CLINIC_OR_DEPARTMENT_OTHER): Payer: Self-pay | Admitting: Orthopedic Surgery

## 2021-07-12 ENCOUNTER — Encounter (HOSPITAL_BASED_OUTPATIENT_CLINIC_OR_DEPARTMENT_OTHER)
Admission: RE | Admit: 2021-07-12 | Discharge: 2021-07-12 | Disposition: A | Discharge status: Home / Self Care | Payer: Medicare PPO | Source: Ambulatory Visit | Attending: Orthopedic Surgery | Admitting: Orthopedic Surgery

## 2021-07-12 DIAGNOSIS — Z01810 Encounter for preprocedural cardiovascular examination: Secondary | ICD-10-CM | POA: Diagnosis not present

## 2021-07-12 DIAGNOSIS — I10 Essential (primary) hypertension: Secondary | ICD-10-CM | POA: Diagnosis not present

## 2021-07-14 ENCOUNTER — Other Ambulatory Visit: Payer: Self-pay | Admitting: Family

## 2021-07-17 NOTE — Anesthesia Preprocedure Evaluation (Addendum)
Anesthesia Evaluation  ?Patient identified by MRN, date of birth, ID band ?Patient awake ? ? ? ?Reviewed: ?Allergy & Precautions, NPO status , Patient's Chart, lab work & pertinent test results ? ?History of Anesthesia Complications ?(+) PONV and history of anesthetic complications ? ?Airway ?Mallampati: I ? ?TM Distance: >3 FB ?Neck ROM: Full ? ? ? Dental ?no notable dental hx. ?(+) Edentulous Upper, Edentulous Lower ?  ?Pulmonary ?neg pulmonary ROS,  ?  ?Pulmonary exam normal ?breath sounds clear to auscultation ? ? ? ? ? ? Cardiovascular ?hypertension, Pt. on medications ?Normal cardiovascular exam ?Rhythm:Regular Rate:Normal ? ? ?  ?Neuro/Psych ?negative neurological ROS ? negative psych ROS  ? GI/Hepatic ?GERD  ,  ?Endo/Other  ? ? Renal/GU ?  ? ?  ?Musculoskeletal ? ?(+) Arthritis ,  ? Abdominal ?(+) + obese,   ?Peds ? Hematology ?  ?Anesthesia Other Findings ?All: latex PCn ? Reproductive/Obstetrics ? ?  ? ? ? ? ? ? ? ? ? ? ? ? ? ?  ?  ? ? ? ? ? ? ? ?Anesthesia Physical ?Anesthesia Plan ? ?ASA: 3 ? ?Anesthesia Plan: Regional and MAC  ? ?Post-op Pain Management: Regional block*, Ofirmev IV (intra-op)* and Minimal or no pain anticipated  ? ?Induction:  ? ?PONV Risk Score and Plan: 4 or greater and Treatment may vary due to age or medical condition and Ondansetron ? ?Airway Management Planned: Natural Airway and Nasal Cannula ? ?Additional Equipment: None ? ?Intra-op Plan:  ? ?Post-operative Plan:  ? ?Informed Consent: I have reviewed the patients History and Physical, chart, labs and discussed the procedure including the risks, benefits and alternatives for the proposed anesthesia with the patient or authorized representative who has indicated his/her understanding and acceptance.  ? ? ? ?Dental advisory given ? ?Plan Discussed with:  ? ?Anesthesia Plan Comments: ( R pop )  ? ? ? ? ? ?Anesthesia Quick Evaluation ? ?

## 2021-07-18 ENCOUNTER — Ambulatory Visit (HOSPITAL_BASED_OUTPATIENT_CLINIC_OR_DEPARTMENT_OTHER)
Admission: RE | Admit: 2021-07-18 | Discharge: 2021-07-19 | Disposition: A | Discharge status: Home / Self Care | Payer: Medicare PPO | Attending: Orthopedic Surgery | Admitting: Orthopedic Surgery

## 2021-07-18 ENCOUNTER — Other Ambulatory Visit: Payer: Self-pay

## 2021-07-18 ENCOUNTER — Ambulatory Visit (HOSPITAL_BASED_OUTPATIENT_CLINIC_OR_DEPARTMENT_OTHER): Payer: Medicare PPO | Admitting: Anesthesiology

## 2021-07-18 ENCOUNTER — Encounter (HOSPITAL_BASED_OUTPATIENT_CLINIC_OR_DEPARTMENT_OTHER): Payer: Self-pay | Admitting: Orthopedic Surgery

## 2021-07-18 ENCOUNTER — Ambulatory Visit (HOSPITAL_BASED_OUTPATIENT_CLINIC_OR_DEPARTMENT_OTHER): Payer: Medicare PPO

## 2021-07-18 ENCOUNTER — Encounter (HOSPITAL_BASED_OUTPATIENT_CLINIC_OR_DEPARTMENT_OTHER)
Admission: RE | Disposition: A | Discharge status: Home / Self Care | Payer: Self-pay | Source: Home / Self Care | Attending: Orthopedic Surgery

## 2021-07-18 DIAGNOSIS — Z969 Presence of functional implant, unspecified: Secondary | ICD-10-CM

## 2021-07-18 DIAGNOSIS — I10 Essential (primary) hypertension: Secondary | ICD-10-CM

## 2021-07-18 DIAGNOSIS — M2011 Hallux valgus (acquired), right foot: Secondary | ICD-10-CM | POA: Insufficient documentation

## 2021-07-18 DIAGNOSIS — M21531 Acquired clawfoot, right foot: Secondary | ICD-10-CM

## 2021-07-18 DIAGNOSIS — M1990 Unspecified osteoarthritis, unspecified site: Secondary | ICD-10-CM | POA: Insufficient documentation

## 2021-07-18 DIAGNOSIS — M21611 Bunion of right foot: Secondary | ICD-10-CM | POA: Diagnosis not present

## 2021-07-18 DIAGNOSIS — E669 Obesity, unspecified: Secondary | ICD-10-CM | POA: Diagnosis not present

## 2021-07-18 DIAGNOSIS — Z6838 Body mass index (BMI) 38.0-38.9, adult: Secondary | ICD-10-CM | POA: Insufficient documentation

## 2021-07-18 DIAGNOSIS — M205X1 Other deformities of toe(s) (acquired), right foot: Secondary | ICD-10-CM | POA: Diagnosis not present

## 2021-07-18 DIAGNOSIS — K219 Gastro-esophageal reflux disease without esophagitis: Secondary | ICD-10-CM | POA: Insufficient documentation

## 2021-07-18 DIAGNOSIS — M6701 Short Achilles tendon (acquired), right ankle: Secondary | ICD-10-CM | POA: Diagnosis not present

## 2021-07-18 DIAGNOSIS — M21532 Acquired clawfoot, left foot: Secondary | ICD-10-CM | POA: Diagnosis not present

## 2021-07-18 DIAGNOSIS — Z9889 Other specified postprocedural states: Secondary | ICD-10-CM

## 2021-07-18 HISTORY — PX: WEIL OSTEOTOMY: SHX5044

## 2021-07-18 SURGERY — OSTEOTOMY, WEIL
Anesthesia: General | Site: Foot | Laterality: Right

## 2021-07-18 MED ORDER — BUPIVACAINE HCL (PF) 0.25 % IJ SOLN
INTRAMUSCULAR | Status: AC
  Filled 2021-07-18: qty 30

## 2021-07-18 MED ORDER — MORPHINE SULFATE (PF) 4 MG/ML IV SOLN: 0.5000 mg | INTRAVENOUS | Status: DC | PRN

## 2021-07-18 MED ORDER — METOCLOPRAMIDE HCL 5 MG/ML IJ SOLN: 5.0000 mg | Freq: Three times a day (TID) | INTRAMUSCULAR | Status: DC | PRN

## 2021-07-18 MED ORDER — BISACODYL 10 MG RE SUPP: 10.0000 mg | Freq: Every day | RECTAL | Status: DC | PRN

## 2021-07-18 MED ORDER — FENTANYL CITRATE (PF) 100 MCG/2ML IJ SOLN
INTRAMUSCULAR | Status: AC
  Filled 2021-07-18: qty 2

## 2021-07-18 MED ORDER — PROPOFOL 10 MG/ML IV BOLUS
INTRAVENOUS | Status: AC
Start: 2021-07-18 — End: ?
  Filled 2021-07-18: qty 20

## 2021-07-18 MED ORDER — LIDOCAINE-EPINEPHRINE 1 %-1:100000 IJ SOLN
INTRAMUSCULAR | Status: AC
  Filled 2021-07-18: qty 1

## 2021-07-18 MED ORDER — TRAZODONE HCL 50 MG PO TABS
50.0000 mg | ORAL_TABLET | Freq: Every day | ORAL | Status: DC
  Administered 2021-07-18: 50 mg via ORAL
  Filled 2021-07-18: qty 1

## 2021-07-18 MED ORDER — HYDROCODONE-ACETAMINOPHEN 7.5-325 MG PO TABS: 1.0000 | ORAL_TABLET | ORAL | Status: DC | PRN

## 2021-07-18 MED ORDER — PROPOFOL 500 MG/50ML IV EMUL
INTRAVENOUS | Status: DC | PRN
  Administered 2021-07-18: 250 ug/kg/min via INTRAVENOUS
  Administered 2021-07-18: 100 ug/kg/min via INTRAVENOUS

## 2021-07-18 MED ORDER — DOCUSATE SODIUM 100 MG PO CAPS
100.0000 mg | ORAL_CAPSULE | Freq: Two times a day (BID) | ORAL | Status: DC
  Filled 2021-07-18: qty 1

## 2021-07-18 MED ORDER — LACTATED RINGERS IV SOLN: INTRAVENOUS | Status: DC

## 2021-07-18 MED ORDER — CEFAZOLIN SODIUM-DEXTROSE 2-4 GM/100ML-% IV SOLN
INTRAVENOUS | Status: AC
  Filled 2021-07-18: qty 100

## 2021-07-18 MED ORDER — FENTANYL CITRATE (PF) 100 MCG/2ML IJ SOLN: 25.0000 ug | INTRAMUSCULAR | Status: DC | PRN

## 2021-07-18 MED ORDER — FENTANYL CITRATE (PF) 100 MCG/2ML IJ SOLN
INTRAMUSCULAR | Status: DC | PRN
  Administered 2021-07-18: 25 ug via INTRAVENOUS
  Administered 2021-07-18: 50 ug via INTRAVENOUS
  Administered 2021-07-18: 25 ug via INTRAVENOUS

## 2021-07-18 MED ORDER — LISINOPRIL 10 MG PO TABS
10.0000 mg | ORAL_TABLET | Freq: Every morning | ORAL | Status: DC
Start: 2021-07-18 — End: 2021-07-19
  Filled 2021-07-18: qty 1

## 2021-07-18 MED ORDER — HYDROCODONE-ACETAMINOPHEN 5-325 MG PO TABS
1.0000 | ORAL_TABLET | ORAL | Status: DC | PRN
  Administered 2021-07-18 – 2021-07-19 (×5): 1 via ORAL
  Filled 2021-07-18 (×2): qty 1
  Filled 2021-07-18: qty 2
  Filled 2021-07-18 (×2): qty 1
  Filled 2021-07-18: qty 2

## 2021-07-18 MED ORDER — 0.9 % SODIUM CHLORIDE (POUR BTL) OPTIME
TOPICAL | Status: DC | PRN
  Administered 2021-07-18: 120 mL

## 2021-07-18 MED ORDER — SERTRALINE HCL 100 MG PO TABS
100.0000 mg | ORAL_TABLET | Freq: Every day | ORAL | Status: DC
  Filled 2021-07-18: qty 1

## 2021-07-18 MED ORDER — MIDAZOLAM HCL 2 MG/2ML IJ SOLN
INTRAMUSCULAR | Status: AC
  Filled 2021-07-18: qty 2

## 2021-07-18 MED ORDER — SODIUM CHLORIDE 0.9 % IV SOLN: INTRAVENOUS | Status: DC

## 2021-07-18 MED ORDER — ONDANSETRON HCL 4 MG PO TABS
4.0000 mg | ORAL_TABLET | Freq: Four times a day (QID) | ORAL | Status: DC | PRN
  Administered 2021-07-19: 4 mg via ORAL
  Filled 2021-07-18 (×2): qty 1

## 2021-07-18 MED ORDER — FENTANYL CITRATE (PF) 100 MCG/2ML IJ SOLN
INTRAMUSCULAR | Status: AC
Start: 2021-07-18 — End: ?
  Filled 2021-07-18: qty 2

## 2021-07-18 MED ORDER — LIDOCAINE 2% (20 MG/ML) 5 ML SYRINGE
INTRAMUSCULAR | Status: AC
  Filled 2021-07-18: qty 5

## 2021-07-18 MED ORDER — METHOCARBAMOL 1000 MG/10ML IJ SOLN: 500.0000 mg | Freq: Four times a day (QID) | INTRAVENOUS | Status: DC | PRN

## 2021-07-18 MED ORDER — METOCLOPRAMIDE HCL 5 MG PO TABS: 5.0000 mg | ORAL_TABLET | Freq: Three times a day (TID) | ORAL | Status: DC | PRN

## 2021-07-18 MED ORDER — WHITE PETROLATUM EX OINT
TOPICAL_OINTMENT | CUTANEOUS | Status: AC
  Filled 2021-07-18: qty 28.35

## 2021-07-18 MED ORDER — ONDANSETRON HCL 4 MG/2ML IJ SOLN
INTRAMUSCULAR | Status: DC | PRN
  Administered 2021-07-18: 4 mg via INTRAVENOUS

## 2021-07-18 MED ORDER — PHENYLEPHRINE HCL (PRESSORS) 10 MG/ML IV SOLN
INTRAVENOUS | Status: AC
  Filled 2021-07-18: qty 1

## 2021-07-18 MED ORDER — PHENYLEPHRINE HCL (PRESSORS) 10 MG/ML IV SOLN
INTRAVENOUS | Status: DC | PRN
  Administered 2021-07-18: 80 ug via INTRAVENOUS

## 2021-07-18 MED ORDER — ONDANSETRON HCL 4 MG/2ML IJ SOLN: 4.0000 mg | Freq: Once | INTRAMUSCULAR | Status: DC | PRN

## 2021-07-18 MED ORDER — PROPOFOL 10 MG/ML IV BOLUS
INTRAVENOUS | Status: AC
  Filled 2021-07-18: qty 20

## 2021-07-18 MED ORDER — ONDANSETRON HCL 4 MG/2ML IJ SOLN: 4.0000 mg | Freq: Four times a day (QID) | INTRAMUSCULAR | Status: DC | PRN

## 2021-07-18 MED ORDER — HYDROCODONE-ACETAMINOPHEN 5-325 MG PO TABS: 1.0000 | ORAL_TABLET | ORAL | 0 refills | Status: DC | PRN

## 2021-07-18 MED ORDER — LIDOCAINE HCL (PF) 1 % IJ SOLN
INTRAMUSCULAR | Status: DC | PRN
  Administered 2021-07-18: 10 mL via INTRADERMAL

## 2021-07-18 MED ORDER — CEFAZOLIN SODIUM-DEXTROSE 1-4 GM/50ML-% IV SOLN
1.0000 g | Freq: Four times a day (QID) | INTRAVENOUS | Status: AC
  Administered 2021-07-18 (×2): 1 g via INTRAVENOUS
  Filled 2021-07-18 (×2): qty 50

## 2021-07-18 MED ORDER — ACETAMINOPHEN 10 MG/ML IV SOLN: 1000.0000 mg | Freq: Once | INTRAVENOUS | Status: DC | PRN

## 2021-07-18 MED ORDER — FENTANYL CITRATE (PF) 100 MCG/2ML IJ SOLN
100.0000 ug | Freq: Once | INTRAMUSCULAR | Status: AC
  Administered 2021-07-18: 50 ug via INTRAVENOUS

## 2021-07-18 MED ORDER — BUPIVACAINE HCL (PF) 0.5 % IJ SOLN
INTRAMUSCULAR | Status: AC
  Filled 2021-07-18: qty 120

## 2021-07-18 MED ORDER — ROCURONIUM BROMIDE 10 MG/ML (PF) SYRINGE
PREFILLED_SYRINGE | INTRAVENOUS | Status: AC
  Filled 2021-07-18: qty 10

## 2021-07-18 MED ORDER — METHOCARBAMOL 500 MG PO TABS
500.0000 mg | ORAL_TABLET | Freq: Four times a day (QID) | ORAL | Status: DC | PRN
Start: 2021-07-18 — End: 2021-07-19
  Administered 2021-07-18: 500 mg via ORAL
  Filled 2021-07-18: qty 1

## 2021-07-18 MED ORDER — POLYETHYLENE GLYCOL 3350 17 G PO PACK: 17.0000 g | PACK | Freq: Every day | ORAL | Status: DC | PRN

## 2021-07-18 MED ORDER — DIPHENHYDRAMINE HCL 12.5 MG/5ML PO ELIX: 12.5000 mg | ORAL_SOLUTION | ORAL | Status: DC | PRN

## 2021-07-18 MED ORDER — ACETAMINOPHEN 325 MG PO TABS: 325.0000 mg | ORAL_TABLET | Freq: Four times a day (QID) | ORAL | Status: DC | PRN

## 2021-07-18 MED ORDER — DEXAMETHASONE SODIUM PHOSPHATE 10 MG/ML IJ SOLN
INTRAMUSCULAR | Status: AC
  Filled 2021-07-18: qty 1

## 2021-07-18 MED ORDER — LIDOCAINE HCL (PF) 1 % IJ SOLN
INTRAMUSCULAR | Status: AC
  Filled 2021-07-18: qty 30

## 2021-07-18 MED ORDER — CEFAZOLIN SODIUM-DEXTROSE 2-4 GM/100ML-% IV SOLN
2.0000 g | INTRAVENOUS | Status: AC
  Administered 2021-07-18: 2 g via INTRAVENOUS

## 2021-07-18 MED ORDER — ONDANSETRON HCL 4 MG/2ML IJ SOLN
INTRAMUSCULAR | Status: AC
  Filled 2021-07-18: qty 2

## 2021-07-18 SURGICAL SUPPLY — 47 items
BLADE OSC/SAG .038X5.5 CUT EDG (BLADE) ×6 IMPLANT
BLADE SURG 15 STRL LF DISP TIS (BLADE) ×4 IMPLANT
BLADE SURG 15 STRL SS (BLADE) ×2
BNDG COHESIVE 4X5 TAN ST LF (GAUZE/BANDAGES/DRESSINGS) ×3 IMPLANT
BNDG GAUZE ELAST 4 BULKY (GAUZE/BANDAGES/DRESSINGS) ×3 IMPLANT
CAP PIN ORTHO PINK (CAP) ×1 IMPLANT
CAP PIN PROTECTOR ORTHO WHT (CAP) ×1 IMPLANT
COVER BACK TABLE 60X90IN (DRAPES) ×3 IMPLANT
DRAPE EXTREMITY T 121X128X90 (DISPOSABLE) ×3 IMPLANT
DRAPE IMP U-DRAPE 54X76 (DRAPES) ×3 IMPLANT
DRAPE OEC MINIVIEW 54X84 (DRAPES) IMPLANT
DRAPE U-SHAPE 47X51 STRL (DRAPES) ×3 IMPLANT
DRSG EMULSION OIL 3X3 NADH (GAUZE/BANDAGES/DRESSINGS) ×3 IMPLANT
DRSG PAD ABDOMINAL 8X10 ST (GAUZE/BANDAGES/DRESSINGS) ×4 IMPLANT
DURAPREP 26ML APPLICATOR (WOUND CARE) ×3 IMPLANT
ELECT REM PT RETURN 9FT ADLT (ELECTROSURGICAL) ×2
ELECTRODE REM PT RTRN 9FT ADLT (ELECTROSURGICAL) ×2 IMPLANT
GAUZE SPONGE 4X4 12PLY STRL (GAUZE/BANDAGES/DRESSINGS) ×4 IMPLANT
GLOVE BIOGEL PI IND STRL 9 (GLOVE) ×2 IMPLANT
GLOVE BIOGEL PI INDICATOR 9 (GLOVE) ×2
GOWN STRL REUS W/ TWL LRG LVL3 (GOWN DISPOSABLE) ×2 IMPLANT
GOWN STRL REUS W/TWL LRG LVL3 (GOWN DISPOSABLE) ×2
GOWN STRL REUS W/TWL XL LVL3 (GOWN DISPOSABLE) ×3 IMPLANT
K-WIRE DBL .045X4 NSTRL (WIRE) ×2
K-WIRE DBL .062X4 NSTRL (WIRE) ×2
KWIRE DBL .045X4 NSTRL (WIRE) IMPLANT
KWIRE DBL .062X4 NSTRL (WIRE) IMPLANT
NDL HYPO 25X1 1.5 SAFETY (NEEDLE) IMPLANT
NEEDLE HYPO 25X1 1.5 SAFETY (NEEDLE) ×2 IMPLANT
NS IRRIG 1000ML POUR BTL (IV SOLUTION) ×3 IMPLANT
PACK BASIN DAY SURGERY FS (CUSTOM PROCEDURE TRAY) ×3 IMPLANT
PAD CAST 4YDX4 CTTN HI CHSV (CAST SUPPLIES) ×2 IMPLANT
PADDING CAST ABS 4INX4YD NS (CAST SUPPLIES) ×1
PADDING CAST ABS COTTON 4X4 ST (CAST SUPPLIES) ×2 IMPLANT
PADDING CAST COTTON 4X4 STRL (CAST SUPPLIES) ×2
PENCIL SMOKE EVACUATOR (MISCELLANEOUS) ×3 IMPLANT
SCREW CORTEX ST 2.0X12 (Screw) ×2 IMPLANT
SPONGE T-LAP 18X18 ~~LOC~~+RFID (SPONGE) ×3 IMPLANT
STOCKINETTE 6  STRL (DRAPES) ×2
STOCKINETTE 6 STRL (DRAPES) ×2 IMPLANT
SUCTION FRAZIER HANDLE 10FR (MISCELLANEOUS) ×2
SUCTION TUBE FRAZIER 10FR DISP (MISCELLANEOUS) IMPLANT
SUT ETHILON 2 0 FSLX (SUTURE) ×3 IMPLANT
SYR BULB EAR ULCER 3OZ GRN STR (SYRINGE) ×3 IMPLANT
SYR CONTROL 10ML LL (SYRINGE) ×1 IMPLANT
TOWEL GREEN STERILE FF (TOWEL DISPOSABLE) ×3 IMPLANT
TUBE CONNECTING 20X1/4 (TUBING) ×1 IMPLANT

## 2021-07-18 NOTE — Progress Notes (Signed)
Patient moving to phase II of recovery and needed to use the restroom. We attempted to stand and walk with a walker and patient is unable to support her weight with one leg. A coworker and myself had to assist patient in pivoting into a wheelchair. Dr. Sharol Given notified of patient's status. Also, speaking with her friend (caregiver post surgery) stated that she would not even be able to stay with patient tonight anyhow. Dr. Sharol Given will be placing RCC orders at this time.  ?

## 2021-07-18 NOTE — Progress Notes (Signed)
Assisted Dr. Valma Cava with right, adductor canal, popliteal, ultrasound guided block. Side rails up, monitors on throughout procedure. See vital signs in flow sheet. Tolerated Procedure well. ?

## 2021-07-18 NOTE — Transfer of Care (Signed)
Immediate Anesthesia Transfer of Care Note ? ?Patient: MERRIDITH DERSHEM ? ?Procedure(s) Performed: RIGHT FOOT REVISION WEIL OSTEOTOMY 2ND AND 3RD METATARSAL, GASTROCNEMIUS RECESSION, PROXIMAL INTERPHALANGEAL RESECTION 2ND AND 3RD TOES (Right: Foot) ? ?Patient Location: PACU ? ?Anesthesia Type:MAC and Regional ? ?Level of Consciousness: awake, alert  and oriented ? ?Airway & Oxygen Therapy: Patient Spontanous Breathing and Patient connected to face mask oxygen ? ?Post-op Assessment: Report given to RN and Post -op Vital signs reviewed and stable ? ?Post vital signs: Reviewed and stable ? ?Last Vitals:  ?Vitals Value Taken Time  ?BP    ?Temp    ?Pulse    ?Resp    ?SpO2    ? ? ?Last Pain:  ?Vitals:  ? 07/18/21 0641  ?TempSrc: Oral  ?PainSc: 0-No pain  ?   ? ?Patients Stated Pain Goal: 4 (07/18/21 8676) ? ?Complications: No notable events documented. ?

## 2021-07-18 NOTE — Discharge Instructions (Addendum)
Last hydrocodone was at 0809 ? ?Post Anesthesia Home Care Instructions ? ?Activity: ?Get plenty of rest for the remainder of the day. A responsible individual must stay with you for 24 hours following the procedure.  ?For the next 24 hours, DO NOT: ?-Drive a car ?-Paediatric nurse ?-Drink alcoholic beverages ?-Take any medication unless instructed by your physician ?-Make any legal decisions or sign important papers. ? ?Meals: ?Start with liquid foods such as gelatin or soup. Progress to regular foods as tolerated. Avoid greasy, spicy, heavy foods. If nausea and/or vomiting occur, drink only clear liquids until the nausea and/or vomiting subsides. Call your physician if vomiting continues. ? ?Special Instructions/Symptoms: ?Your throat may feel dry or sore from the anesthesia or the breathing tube placed in your throat during surgery. If this causes discomfort, gargle with warm salt water. The discomfort should disappear within 24 hours. ? ?If you had a scopolamine patch placed behind your ear for the management of post- operative nausea and/or vomiting: ? ?1. The medication in the patch is effective for 72 hours, after which it should be removed.  Wrap patch in a tissue and discard in the trash. Wash hands thoroughly with soap and water. ?2. You may remove the patch earlier than 72 hours if you experience unpleasant side effects which may include dry mouth, dizziness or visual disturbances. ?3. Avoid touching the patch. Wash your hands with soap and water after contact with the patch. ? ? ?Regional Anesthesia Blocks ? ?1. Numbness or the inability to move the "blocked" extremity may last from 3-48 hours after placement. The length of time depends on the medication injected and your individual response to the medication. If the numbness is not going away after 48 hours, call your surgeon. ? ?2. The extremity that is blocked will need to be protected until the numbness is gone and the  Strength has returned. Because  you cannot feel it, you will need to take extra care to avoid injury. Because it may be weak, you may have difficulty moving it or using it. You may not know what position it is in without looking at it while the block is in effect. ? ?3. For blocks in the legs and feet, returning to weight bearing and walking needs to be done carefully. You will need to wait until the numbness is entirely gone and the strength has returned. You should be able to move your leg and foot normally before you try and bear weight or walk. You will need someone to be with you when you first try to ensure you do not fall and possibly risk injury. ? ?4. Bruising and tenderness at the needle site are common side effects and will resolve in a few days. ? ?5. Persistent numbness or new problems with movement should be communicated to the surgeon or the [place] [phone] [place] [phone]).  ?    ?

## 2021-07-18 NOTE — Op Note (Signed)
07/18/2021 ? ?8:54 AM ? ?PATIENT:  [name] [name]   ? ?PRE-OPERATIVE DIAGNOSIS:  Claw Toes Right Foot, Achilles Contracture with bunion deformity ? ?POST-OPERATIVE DIAGNOSIS:  Same ? ?PROCEDURE:  RIGHT FOOT REVISION WEIL OSTEOTOMY 2ND AND 3RD METATARSAL, GASTROCNEMIUS RECESSION, PROXIMAL INTERPHALANGEAL RESECTION 2ND AND 3RD TOES, ?Aiken osteotomy proximal phalanx great toe. ?Removal of deep retained hardware. ? ?SURGEON:  Newt Minion, MD ? ?PHYSICIAN ASSISTANT:None ?ANESTHESIA:   General ? ?PREOPERATIVE INDICATIONS:  [name] [name] is a  81 y.o. female with a diagnosis of Claw Toes Right Foot, Achilles Contracture who failed conservative measures and elected for surgical management.   ? ?The risks benefits and alternatives were discussed with the patient preoperatively including but not limited to the risks of infection, bleeding, nerve injury, cardiopulmonary complications, the need for revision surgery, among others, and the patient was willing to proceed. ? ?OPERATIVE IMPLANTS: K wires 0.0625 and 0.4 0.045 K wires with 2 x 12 mm mini frag screws x2. ? ?'@ENCIMAGES'$ @ ? ?OPERATIVE FINDINGS: After correction of the claw toe deformities of the second and third toes the great toe overlaps the second and third toes with the hallux valgus deformity.  It was determined at this time that a Aiken osteotomy was required to straighten out the great toe in line with the second and third toes. ? ?OPERATIVE PROCEDURE: Patient was brought the operating room after undergoing a regional anesthetic.  After adequate levels anesthesia were obtained patient's right lower extremity was prepped using DuraPrep draped into a sterile field a timeout was called.  The skin incision was made medially 15 cm proximal to the medial malleolus for the gastrocnemius recession.  Patient had a little sensation and this was locally infiltrated with 10 cc of 1% lidocaine plain.  Blunt dissection was carried down to the gastrocnemius fascia and under  direct visualization the gastrocnemius fascia was released with a 15 blade knife.  This took her dorsiflexion from 20 degrees short of neutral to 20 degrees past neutral.  The wound was irrigated with normal saline incision closed using 2-0 nylon.  Attention was then focused over the second and third metatarsals.  A dorsal incision was made over the second ray distal to the PIP joint of the second toe to proximal incorporating the previous incision.  Blunt dissection was carried down to the MTP joint retractors were placed there was a deep retained screw that was removed.  There was no evidence of a osteotomy of the metatarsal head it was intact.  A Weil osteotomy was performed metatarsal head was translated proximally overhanging bone was resected and this was stabilized with a 2 x 12 mm mini frag screw.  Attention was then focused to the second toe PIP joint.  The joint was resected back to bleeding viable subchondral bone a K wire was inserted antegrade from the PIP joint distally and then retrograde into the proximal phalanx and across the metatarsal to stabilize the second ray.  The toe was straight.  Attention was then focused on the third MTP joint.  Retractors were placed there was no evidence of any previous osteotomies to the third metatarsal head.  A Weil osteotomy was performed metatarsal head was translated proximally overhanging bone resected and this was stabilized with a 2 x 12 mm mini frag screw.  The previous mini frag screw was also removed without difficulty.  The toes were straight at this time the great toe was overlapping the second and third toes.  In order to provide  her a plantigrade foot is elected to proceed with an Aiken osteotomy to straighten out the great toe so was  not overlapping the second and third toes.  Incision was made medially retractors were placed a wedge osteotomy was performed medially the toe was straightened and stabilized with a 0.0625 K wire.  At this time there was  no overlapping of the toes they were straight.  The wounds were irrigated normal saline incision closed using 2-0 nylon.  Sterile dressing was applied patient was taken the PACU in stable condition. ? ? ?DISCHARGE PLANNING: ? ?Antibiotic duration: Preoperative antibiotics ? ?Weightbearing: Touchdown weightbearing on the right ? ?Pain medication: vicodin ? ?Dressing care/ Wound VAC: ? ? ?Ambulatory devices:walker ? ?Discharge to: home ? ?Follow-up: In the office 1 week post operative. ? ? ? ? ? ? ?  ?

## 2021-07-18 NOTE — Anesthesia Postprocedure Evaluation (Signed)
Anesthesia Post Note ? ?Patient: SAVREEN GEBHARDT ? ?Procedure(s) Performed: RIGHT FOOT REVISION WEIL OSTEOTOMY 2ND AND 3RD METATARSAL, GASTROCNEMIUS RECESSION, PROXIMAL INTERPHALANGEAL RESECTION 2ND AND 3RD TOES (Right: Foot) ? ?  ? ?Patient location during evaluation: PACU ?Anesthesia Type: MAC and Regional ?Level of consciousness: awake and alert ?Pain management: pain level controlled ?Vital Signs Assessment: post-procedure vital signs reviewed and stable ?Respiratory status: spontaneous breathing, nonlabored ventilation, respiratory function stable and patient connected to nasal cannula oxygen ?Cardiovascular status: stable and blood pressure returned to baseline ?Postop Assessment: no apparent nausea or vomiting ?Anesthetic complications: no ? ? ?No notable events documented. ? ?Last Vitals:  ?Vitals:  ? 07/18/21 0900 07/18/21 0946  ?BP: (!) 143/68 (!) 169/74  ?Pulse: 78 80  ?Resp: 18 18  ?Temp:  37.3 ?C  ?SpO2: 93% 95%  ?  ?Last Pain:  ?Vitals:  ? 07/18/21 0946  ?TempSrc: Oral  ?PainSc: 0-No pain  ? ? ?  ?  ?  ?  ?  ?  ? ?[place] ? ? ? ? ?

## 2021-07-18 NOTE — H&P (Signed)
[name] [name] is an 81 y.o. female.   ?Chief Complaint: Patient complains of progressive deformity and pain of the right forefoot. ?HPI: Is an 81 year old woman who presents with persistent foot pain.  She has previously undergone Weil osteotomy with second and third metatarsal with podiatry.  Patient complains of progressive deformity and overlapping of her toes. ? ?Past Medical History:  ?Diagnosis Date  ? Anemia   ? Anxiety   ? Arthritis   ? Barrett's esophagus   ? seen on 2016 egd at [facility]  ? Depression   ? GERD (gastroesophageal reflux disease)   ? H/O hiatal hernia   ? Hypertension   ? Obesity   ? Pneumonia   ? PONV (postoperative nausea and vomiting)   ? Seasonal allergies   ? Sepsis ([place]) 10/02/2013  ? ? ?Past Surgical History:  ?Procedure Laterality Date  ? ABDOMINAL HYSTERECTOMY    ? ABDOMINAL SURGERY    ? APPENDECTOMY    ? BACK SURGERY    ? lumbar x2 by Dr Arnoldo Morale  ? BIOPSY  09/03/2019  ? Procedure: BIOPSY;  Surgeon: Rogene Houston, MD;  [facility];  Service: Endoscopy;;  esophagus  ? CARPAL TUNNEL RELEASE Right 03/2014  ? Dr. Lorin Mercy  ? CATARACT EXTRACTION W/PHACO Left 08/06/2013  ? Procedure: CATARACT EXTRACTION PHACO AND INTRAOCULAR LENS PLACEMENT LEFT EYE;  Surgeon: Tonny Branch, MD;  Location: [facility];  Service: Ophthalmology;  Laterality: Left;  CDE: 9.55  ? CATARACT EXTRACTION W/PHACO Right 08/24/2013  ? Procedure: CATARACT EXTRACTION PHACO AND INTRAOCULAR LENS PLACEMENT (IOC);  Surgeon: Tonny Branch, MD;  Location: [facility];  Service: Ophthalmology;  Laterality: Right;  CDE:8.30  ? CERVICAL FUSION    ? CHOLECYSTECTOMY    ? COLONOSCOPY N/A 11/03/2013  ? SLF: 1. Normal mucosa in the terminal ileum. 2. 13 colon polyps removed. 3. Moderate diverticulosis in the descending colon and sigmoid colon 4. the left colon is redundant 5. Small internal  hemorrhoids.  ? COLONOSCOPY N/A 09/25/2017  ? Procedure: COLONOSCOPY;  Surgeon: Rogene Houston, MD;  [facility];  Service: Endoscopy;   Laterality: N/A;  1:55  ? COLONOSCOPY WITH PROPOFOL N/A 04/29/2020  ? Castaneda: multiple polyps (tubular adenoma, sessile serrated, hyperplastic) ascending colon, transverse colon, descending colon, diverticulsos in sigmoid and descending colon, rectum/anal vergse normal  ? ESOPHAGOGASTRODUODENOSCOPY N/A 11/03/2013  ? SLF: 1. 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 Mild Non-erosive gastritis  ? ESOPHAGOGASTRODUODENOSCOPY  10/2014  ? [facility]: IMPRESSIONS: - likely small distal esophageal diverticulum without evidence of fistula, +barrett's esophagus without dysplasia. s/p gastric bypass  ? ESOPHAGOGASTRODUODENOSCOPY (EGD) WITH PROPOFOL N/A 09/03/2019  ? rehman: normal hypopharynx, esophagus, mid esophagus, diverticulum in distal esophagus, short segment barretts esophagus, gastric bypass with small sized pouch, gastrojejunal anastomosis with healthy appearing mucosa, normal gastric body and jejunum  ? EYE SURGERY    ? GASTRIC BYPASS  1979  ? revision in 1980   ? HIATAL HERNIA REPAIR    ? [facility] in 2011  ? JOINT REPLACEMENT Right 1995  ? hip  ? JOINT REPLACEMENT Left 2008  ? hip  ? MEDIAL PARTIAL KNEE REPLACEMENT Left   ? Dr Ronnie Derby  ? PARAESOPHAGEAL HERNIA REPAIR  SEP 2010 DR. MCNATT  ? POLYPECTOMY  09/25/2017  ? Procedure: POLYPECTOMY;  Surgeon: Rogene Houston, MD;  [facility];  Service: Endoscopy;;  colon  ? POLYPECTOMY  04/29/2020  ? Procedure: POLYPECTOMY;  Surgeon:  Harvel Quale, MD;  [facility];  Service: Gastroenterology;;  ? SHOULDER ARTHROSCOPY Right   ? SHOULDER ARTHROSCOPY Right   ? x2  ? SKIN BIOPSY    ? TONSILLECTOMY    ? TOTAL KNEE ARTHROPLASTY Right 09/08/2018  ? Procedure: RIGHT TOTAL KNEE ARTHROPLASTY;  Surgeon: Marybelle Killings, MD;  Location: [place];  Service: Orthopedics;  Laterality: Right;  ? TOTAL SHOULDER ARTHROPLASTY Right 02/16/2013  ? Procedure: TOTAL SHOULDER ARTHROPLASTY;  Surgeon: Marybelle Killings, MD;   Location: [place];  Service: Orthopedics;  Laterality: Right;  Right Total Shoulder Arthroplasty, Cemented  ? ? ?Family History  ?Problem Relation Age of Onset  ? Colon cancer Brother   ?     IN HIS 60s-METASTATIC  ? Colon polyps Paternal Grandfather   ? Breast cancer Mother   ? Hypertension Father   ? Heart disease Father   ? Hypertension Brother   ? Heart disease Brother   ? ?Social History:  reports that she has never smoked. She has never used smokeless tobacco. She reports that she does not drink alcohol and does not use drugs. ? ?Allergies:  ?Allergies  ?Allergen Reactions  ? Gadavist [Gadobutrol] Itching and Swelling  ?  Per patient   ? Novocain [Procaine Hcl] Other (See Comments)  ?  Unknown-passed out with novocaine injected for dental surgery.  ? Other Hives and Other (See Comments)  ?  EKG pads - need to use pediatric pads  ? Latex Rash  ? Penicillins Rash and Other (See Comments)  ?  Has patient had a PCN reaction causing immediate rash, facial/tongue/throat swelling, SOB or lightheadedness with hypotension: Yes ?Has patient had a PCN reaction causing severe rash involving mucus membranes or skin necrosis: No ?Has patient had a PCN reaction that required hospitalization No ?Has patient had a PCN reaction occurring within the last 10 years: No  ?If all of the above answers are "NO", then may proceed with Cephalosporin use. ?  ? Sulfa Antibiotics Rash  ? ? ?Medications Prior to Admission  ?Medication Sig Dispense Refill  ? acetaminophen (TYLENOL) 500 MG tablet Take 1,000 mg by mouth every 6 (six) hours as needed for moderate pain or headache.    ? albuterol (PROVENTIL) (2.5 MG/3ML) 0.083% nebulizer solution Take 3 mLs (2.5 mg total) by nebulization every 4 (four) hours as needed for wheezing. (Patient taking differently: Take 2.5 mg by nebulization every 6 (six) hours as needed for wheezing.) 75 mL 12  ? alendronate (FOSAMAX) 70 MG tablet Take 70 mg by mouth every Tuesday.     ? AZO-CRANBERRY PO Take 2  tablets by mouth daily as needed (kidney infections).     ? Calcium-Phosphorus-Vitamin D (CITRACAL +D3 PO) Take 1 tablet by mouth daily.    ? Cholecalciferol (VITAMIN D3) 50 MCG (2000 UT) capsule Take 2,000 Units by mouth daily.     ? conjugated estrogens (PREMARIN) vaginal cream Place 1 Applicatorful vaginally at bedtime. Use as directed at bedtime 30 g 12  ? docusate sodium (COLACE) 100 MG capsule Take 100 mg by mouth at bedtime as needed for mild constipation.     ? Ferrous Sulfate (SLOW FE PO) Take 1 tablet by mouth daily.    ? fluticasone (FLONASE) 50 MCG/ACT nasal spray Place 2 sprays into both nostrils daily as needed for allergies.    ? Lactobacillus (FLORAJEN ACIDOPHILUS) CAPS Take 1 capsule by mouth daily before breakfast.    ? lisinopril (ZESTRIL) 10 MG tablet Take 10  mg by mouth every morning.    ? MUCINEX 600 MG 12 hr tablet Take 600 mg by mouth 2 (two) times daily as needed for cough.     ? Nystatin (GERHARDT'S BUTT CREAM) CREA Apply 1 application topically 3 (three) times daily as needed for irritation. 1 each 11  ? nystatin ointment (MYCOSTATIN) Apply 1 application topically 2 (two) times daily as needed (for irritation).    ? omeprazole (PRILOSEC) 20 MG capsule Take 1 capsule (20 mg total) by mouth daily. 90 capsule 3  ? sertraline (ZOLOFT) 100 MG tablet Take 100 mg by mouth daily.    ? traZODone (DESYREL) 50 MG tablet Take 50 mg by mouth at bedtime.    ? clobetasol (TEMOVATE) 0.05 % external solution SMARTSIG:6-8 Drop(s) Topical Every Night PRN    ? dicyclomine (BENTYL) 10 MG capsule TAKE ONE CAPSULE BY MOUTH EVERY TWELVE HOURS AS NEEDED FOR SPASMS 60 capsule 2  ? naproxen (NAPROSYN) 500 MG tablet Take 500 mg by mouth as needed.    ? ondansetron (ZOFRAN) 4 MG tablet Take 4 mg by mouth every 8 (eight) hours as needed for nausea or vomiting.    ? ? ?No results found for this or any previous visit (from the past 48 hour(s)). ?No results found. ? ?Review of Systems  ?All other systems reviewed and are  negative. ? ?Height '5\' 3"'$  (1.6 m), weight 98 kg. ?Physical Exam  ?Patient is alert, oriented, no adenopathy, well-dressed, normal affect, normal respiratory effort. ?Examination patient has venous insuffic

## 2021-07-19 ENCOUNTER — Encounter (HOSPITAL_BASED_OUTPATIENT_CLINIC_OR_DEPARTMENT_OTHER): Payer: Self-pay | Admitting: Orthopedic Surgery

## 2021-07-19 DIAGNOSIS — M2011 Hallux valgus (acquired), right foot: Secondary | ICD-10-CM | POA: Diagnosis not present

## 2021-07-19 NOTE — Discharge Summary (Signed)
Discharge Diagnoses:  ?Principal Problem: ?  S/P foot surgery, right ?Active Problems: ?  Claw toe, right ?  Contracture of right Achilles tendon ?  Bunion, right foot ?  Retained orthopedic hardware ? ? ?Surgeries: Procedure(s): ?RIGHT FOOT REVISION WEIL OSTEOTOMY 2ND AND 3RD METATARSAL, GASTROCNEMIUS RECESSION, PROXIMAL INTERPHALANGEAL RESECTION 2ND AND 3RD TOES on 07/18/2021  ?  ?Consultants:  ? ?Discharged Condition: Improved ? ?Hospital Course: [name] [name] is an 81 y.o. female who was admitted 07/18/2021 with a chief complaint of claw toes right foot, with a final diagnosis of Claw Toes Right Foot, Achilles Contracture.  Patient was brought to the operating room on 07/18/2021 and underwent Procedure(s): ?RIGHT FOOT REVISION WEIL OSTEOTOMY 2ND AND 3RD METATARSAL, GASTROCNEMIUS RECESSION, PROXIMAL INTERPHALANGEAL RESECTION 2ND AND 3RD TOES.   ? ?Patient was given perioperative antibiotics:  ?Anti-infectives (From admission, onward)  ? ? Start     Dose/Rate Route Frequency Ordered Stop  ? 07/18/21 1030  ceFAZolin (ANCEF) IVPB 1 g/50 mL premix       ? 1 g ?100 mL/hr over 30 Minutes Intravenous Every 6 hours 07/18/21 1020 07/19/21 0429  ? 07/18/21 0615  ceFAZolin (ANCEF) IVPB 2g/100 mL premix       ? 2 g ?200 mL/hr over 30 Minutes Intravenous On call to O.R. 07/18/21 [id number] 07/18/21 0736  ? ?  ?. ? ?Patient was given sequential compression devices, early ambulation, and aspirin for DVT prophylaxis. ? ?Recent vital signs: Patient Vitals for the past 24 hrs: ? BP Temp Temp src Pulse Resp SpO2  ?07/19/21 0600 -- -- -- -- 18 94 %  ?07/19/21 0420 (!) 153/65 98.7 ?F (37.1 ?C) -- -- 18 94 %  ?07/19/21 0300 -- -- -- 71 18 93 %  ?07/19/21 0140 -- -- -- 77 18 90 %  ?07/19/21 0040 (!) 159/68 -- -- 78 18 94 %  ?07/18/21 2320 -- -- -- 76 18 95 %  ?07/18/21 2145 (!) 146/61 98.6 ?F (37 ?C) -- 77 18 95 %  ?07/18/21 2100 -- -- -- 80 18 95 %  ?07/18/21 2050 -- -- -- 82 18 95 %  ?07/18/21 1910 -- -- -- 78 18 95 %  ?07/18/21 1730 (!)  130/42 -- -- 78 18 95 %  ?07/18/21 1530 -- -- -- 82 18 97 %  ?07/18/21 1345 140/81 -- -- 87 18 94 %  ?07/18/21 1300 -- -- -- 86 16 95 %  ?07/18/21 1150 -- -- -- 71 18 94 %  ?07/18/21 1041 139/80 98.2 ?F (36.8 ?C) -- 75 18 97 %  ?07/18/21 0946 (!) 169/74 99.1 ?F (37.3 ?C) Oral 80 18 95 %  ?07/18/21 0900 (!) 143/68 -- -- 78 18 93 %  ?07/18/21 0845 (!) 124/46 97.7 ?F (36.5 ?C) -- 92 17 99 %  ?07/18/21 0730 (!) 196/80 -- -- 85 14 98 %  ?07/18/21 0725 (!) 192/83 -- -- 89 16 98 %  ?07/18/21 0720 (!) 186/95 -- -- 87 17 98 %  ?. ? ?Recent laboratory studies: DG MINI C-ARM IMAGE ONLY ? ?Result Date: 07/18/2021 ?There is no interpretation for this exam.  This order is for images obtained during a surgical procedure.  Please See "Surgeries" Tab for more information regarding the procedure.   ? ?Discharge Medications:   ?Allergies as of 07/19/2021   ? ?   Reactions  ? Gadavist [gadobutrol] Itching, Swelling  ? Per patient   ? Novocain [procaine Hcl] Other (See Comments)  ? Unknown-passed out with novocaine injected  for dental surgery.  ? Other Hives, Other (See Comments)  ? EKG pads - need to use pediatric pads  ? Latex Rash  ? Penicillins Rash, Other (See Comments)  ? Has patient had a PCN reaction causing immediate rash, facial/tongue/throat swelling, SOB or lightheadedness with hypotension: Yes ?Has patient had a PCN reaction causing severe rash involving mucus membranes or skin necrosis: No ?Has patient had a PCN reaction that required hospitalization No ?Has patient had a PCN reaction occurring within the last 10 years: No  ?If all of the above answers are "NO", then may proceed with Cephalosporin use.  ? Sulfa Antibiotics Rash  ? ?  ? ?  ?Medication List  ?  ? ?TAKE these medications   ? ?acetaminophen 500 MG tablet ?Commonly known as: TYLENOL ?Take 1,000 mg by mouth every 6 (six) hours as needed for moderate pain or headache. ?  ?albuterol (2.5 MG/3ML) 0.083% nebulizer solution ?Commonly known as: PROVENTIL ?Take 3 mLs  (2.5 mg total) by nebulization every 4 (four) hours as needed for wheezing. ?What changed: when to take this ?  ?alendronate 70 MG tablet ?Commonly known as: FOSAMAX ?Take 70 mg by mouth every Tuesday. ?  ?AZO-CRANBERRY PO ?Take 2 tablets by mouth daily as needed (kidney infections). ?  ?CITRACAL +D3 PO ?Take 1 tablet by mouth daily. ?  ?clobetasol 0.05 % external solution ?Commonly known as: TEMOVATE ?SMARTSIG:6-8 Drop(s) Topical Every Night PRN ?  ?dicyclomine 10 MG capsule ?Commonly known as: BENTYL ?TAKE ONE CAPSULE BY MOUTH EVERY TWELVE HOURS AS NEEDED FOR SPASMS ?  ?docusate sodium 100 MG capsule ?Commonly known as: COLACE ?Take 100 mg by mouth at bedtime as needed for mild constipation. ?  ?Florajen Acidophilus Caps ?Take 1 capsule by mouth daily before breakfast. ?  ?fluticasone 50 MCG/ACT nasal spray ?Commonly known as: FLONASE ?Place 2 sprays into both nostrils daily as needed for allergies. ?  ?Gerhardt's butt cream Crea ?Apply 1 application topically 3 (three) times daily as needed for irritation. ?  ?HYDROcodone-acetaminophen 5-325 MG tablet ?Commonly known as: NORCO/VICODIN ?Take 1 tablet by mouth every 4 (four) hours as needed for moderate pain. ?  ?lisinopril 10 MG tablet ?Commonly known as: ZESTRIL ?Take 10 mg by mouth every morning. ?  ?Mucinex 600 MG 12 hr tablet ?Generic drug: guaiFENesin ?Take 600 mg by mouth 2 (two) times daily as needed for cough. ?  ?naproxen 500 MG tablet ?Commonly known as: NAPROSYN ?Take 500 mg by mouth as needed. ?  ?nystatin ointment ?Commonly known as: MYCOSTATIN ?Apply 1 application topically 2 (two) times daily as needed (for irritation). ?  ?omeprazole 20 MG capsule ?Commonly known as: PRILOSEC ?Take 1 capsule (20 mg total) by mouth daily. ?  ?ondansetron 4 MG tablet ?Commonly known as: ZOFRAN ?Take 4 mg by mouth every 8 (eight) hours as needed for nausea or vomiting. ?  ?Premarin vaginal cream ?Generic drug: conjugated estrogens ?Place 1 Applicatorful vaginally at  bedtime. Use as directed at bedtime ?  ?sertraline 100 MG tablet ?Commonly known as: ZOLOFT ?Take 100 mg by mouth daily. ?  ?SLOW FE PO ?Take 1 tablet by mouth daily. ?  ?traZODone 50 MG tablet ?Commonly known as: DESYREL ?Take 50 mg by mouth at bedtime. ?  ?Vitamin D3 50 MCG (2000 UT) capsule ?Take 2,000 Units by mouth daily. ?  ? ?  ? ?  ?  ? ? ?  ?Discharge Care Instructions  ?(From admission, onward)  ?  ? ? ?  ? ?  Start     Ordered  ? 07/18/21 0000  Touch down weight bearing       ?Question Answer Comment  ?Laterality right   ?Extremity Lower   ?  ? 07/18/21 0849  ? ?  ?  ? ?  ? ? ?Diagnostic Studies: DG MINI C-ARM IMAGE ONLY ? ?Result Date: 07/18/2021 ?There is no interpretation for this exam.  This order is for images obtained during a surgical procedure.  Please See "Surgeries" Tab for more information regarding the procedure.   ? ?Patient benefited maximally from their hospital stay and there were no complications.   ? ? ?Disposition: Discharge disposition: 01-Home or Self Care ? ? ? ? ? ?Discharge Instructions   ? ? Call MD / Call 911   Complete by: As directed ?  ? If you experience chest pain or shortness of breath, CALL 911 and be transported to the hospital emergency room.  If you develope a fever above 101 F, pus (white drainage) or increased drainage or redness at the wound, or calf pain, call your surgeon's office.  ? Call MD / Call 911   Complete by: As directed ?  ? If you experience chest pain or shortness of breath, CALL 911 and be transported to the hospital emergency room.  If you develope a fever above 101 F, pus (white drainage) or increased drainage or redness at the wound, or calf pain, call your surgeon's office.  ? Constipation Prevention   Complete by: As directed ?  ? Drink plenty of fluids.  Prune juice may be helpful.  You may use a stool softener, such as Colace (over the counter) 100 mg twice a day.  Use MiraLax (over the counter) for constipation as needed.  ? Constipation  Prevention   Complete by: As directed ?  ? Drink plenty of fluids.  Prune juice may be helpful.  You may use a stool softener, such as Colace (over the counter) 100 mg twice a day.  Use MiraLax (over the counter) fo

## 2021-07-24 ENCOUNTER — Ambulatory Visit (HOSPITAL_COMMUNITY): Payer: Medicare PPO

## 2021-07-25 ENCOUNTER — Ambulatory Visit (HOSPITAL_COMMUNITY)
Admission: RE | Admit: 2021-07-25 | Discharge: 2021-07-25 | Disposition: A | Discharge status: Home / Self Care | Payer: Medicare PPO | Source: Ambulatory Visit | Attending: Family | Admitting: Family

## 2021-07-25 ENCOUNTER — Encounter: Payer: Self-pay | Admitting: Family

## 2021-07-25 ENCOUNTER — Telehealth: Payer: Self-pay | Admitting: Orthopedic Surgery

## 2021-07-25 ENCOUNTER — Ambulatory Visit (INDEPENDENT_AMBULATORY_CARE_PROVIDER_SITE_OTHER): Payer: Medicare PPO | Admitting: Family

## 2021-07-25 DIAGNOSIS — M79604 Pain in right leg: Secondary | ICD-10-CM

## 2021-07-25 DIAGNOSIS — M7989 Other specified soft tissue disorders: Secondary | ICD-10-CM

## 2021-07-25 DIAGNOSIS — L538 Other specified erythematous conditions: Secondary | ICD-10-CM

## 2021-07-25 DIAGNOSIS — M205X1 Other deformities of toe(s) (acquired), right foot: Secondary | ICD-10-CM

## 2021-07-25 NOTE — Progress Notes (Signed)
Post-Op Visit Note   Patient: [name] [name]           Date of Birth: 1940/06/04           MRN: [id number] Visit Date: 07/25/2021 PCP: Curlene Labrum, MD  Chief Complaint: No chief complaint on file.   HPI:  HPI The patient is an 81 year old woman seen today status post right foot revision Weil osteotomy second and third metatarsals, PIP resection of second and third toes.  Aiken osteotomy she is also had gastroc recession and removal of hardware.  Complaining of some intense calf pain wonders if this is coming from an infection in her incision did have to remove part of her surgical dressing due to pain Ortho Exam On examination of the right lower extremity she does have calf swelling and tenderness some mild erythema around her incision there is no warmth she does have point tenderness to the calf.  There is pain with dorsiflexion.  Her incisions of the right foot are well approximated with 2 sutures there is no gaping no drainage she has a pin in place in the great toe and second toe  Visit Diagnoses: No diagnosis found.  Plan: We will send for ultrasound to rule out DVT of the right calf.  Begin daily Dial soap cleansing and dry dressing changes.  She will paint the pin tracks with Neosporin.  Follow-up in 2 weeks.  Follow-Up Instructions: No follow-ups on file.   Imaging: No results found.  Orders:  No orders of the defined types were placed in this encounter.  No orders of the defined types were placed in this encounter.    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   History of arthroplasty of right shoulder 08/14/2018   History of colonic polyps 08/14/2017   Choledocholithiasis    Cholestasis 02/05/2017   Depression 02/05/2017   Hypomagnesemia 02/05/2017   Bradycardia 02/05/2017   Transaminitis 07/08/2016   Constipation 07/08/2016   GIB (gastrointestinal bleeding) 07/08/2016   HCAP (healthcare-associated pneumonia) 09/23/2015   AKI (acute kidney injury) ([place]) 07/21/2015   Hypokalemia 07/21/2015   CAP (community acquired pneumonia) 07/14/2015   Upper abdominal pain 66/08/3014   Folliculitis of perineum 02/24/2014   Giant comedone 12/21/2013   Sebaceous gland hyperplasia of vulva 11/25/2013   Acrochordon 11/25/2013   Dysphagia 10/29/2013   Class 2 severe obesity due to excess calories with serious comorbidity and body mass index (BMI) of 38.0 to 38.9 in adult [facility]) 10/02/2013   Primary hypertension    GERD (gastroesophageal reflux disease)    Anxiety    Anemia of chronic disease    Past Medical History:  Diagnosis Date   Anemia    Anxiety    Arthritis    Barrett's esophagus    seen on [facility]d at [facility]   Depression    GERD (gastroesophageal reflux disease)    H/O hiatal hernia    Hypertension    Obesity  Pneumonia    PONV (postoperative nausea and vomiting)    Seasonal allergies    Sepsis ([place]) 10/02/2013    Family History  Problem Relation Age of Onset   Colon cancer Brother        IN HIS 60s-METASTATIC   Colon polyps Paternal Grandfather    Breast cancer Mother    Hypertension Father    Heart disease Father    Hypertension Brother    Heart disease Brother     Past Surgical History:  Procedure Laterality Date   ABDOMINAL HYSTERECTOMY     ABDOMINAL SURGERY     APPENDECTOMY     BACK SURGERY     lumbar x2 by Dr Arnoldo Morale   BIOPSY  09/03/2019   Procedure: BIOPSY;  Surgeon: Rogene Houston, MD;  [facility];  Service: Endoscopy;;  esophagus   CARPAL TUNNEL RELEASE Right 03/2014   Dr. Lorin Mercy   CATARACT EXTRACTION W/PHACO Left 08/06/2013   Procedure: CATARACT EXTRACTION PHACO AND INTRAOCULAR LENS PLACEMENT LEFT EYE;  Surgeon: Tonny Branch, MD;  [facility];  Service: Ophthalmology;  Laterality: Left;  CDE: 9.55   CATARACT EXTRACTION W/PHACO Right 08/24/2013   Procedure: CATARACT EXTRACTION PHACO AND INTRAOCULAR LENS PLACEMENT (IOC);  Surgeon: Tonny Branch, MD;  [facility]: [facility];  Service: Ophthalmology;  Laterality: Right;  CDE:8.30   CERVICAL FUSION     CHOLECYSTECTOMY     COLONOSCOPY N/A 11/03/2013   SLF: 1. Normal mucosa in the terminal ileum. 2. 13 colon polyps removed. 3. Moderate diverticulosis in the descending colon and sigmoid colon 4. the left colon is redundant 5. Small internal  hemorrhoids.   COLONOSCOPY N/A 09/25/2017   Procedure: COLONOSCOPY;  Surgeon: Rogene Houston, MD;  [facility];  Service: Endoscopy;  Laterality: N/A;  1:55   COLONOSCOPY WITH PROPOFOL N/A 04/29/2020   Castaneda: multiple polyps (tubular adenoma, sessile serrated, hyperplastic) ascending colon, transverse colon, descending colon, diverticulsos in sigmoid and descending colon, rectum/anal vergse normal   ESOPHAGOGASTRODUODENOSCOPY N/A 11/03/2013   SLF: 1. 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 Mild Non-erosive gastritis   ESOPHAGOGASTRODUODENOSCOPY  10/2014   [facility]: IMPRESSIONS: - likely small distal esophageal diverticulum without evidence of fistula, +barrett's esophagus without dysplasia. s/p gastric bypass   ESOPHAGOGASTRODUODENOSCOPY (EGD) WITH PROPOFOL N/A 09/03/2019   rehman: normal hypopharynx, esophagus, mid esophagus, diverticulum in distal esophagus, short segment barretts esophagus, gastric bypass with small sized pouch, gastrojejunal anastomosis with healthy appearing mucosa, normal gastric body and jejunum   EYE SURGERY      GASTRIC BYPASS  1979   revision in [place] in 2011   JOINT REPLACEMENT Right 1995   hip   JOINT REPLACEMENT Left 2008   hip   MEDIAL PARTIAL KNEE REPLACEMENT Left    Dr Ronnie Derby   PARAESOPHAGEAL HERNIA REPAIR  SEP 2010 DR. MCNATT   POLYPECTOMY  09/25/2017   Procedure: POLYPECTOMY;  Surgeon: Rogene Houston, MD;  [facility];  Service: Endoscopy;;  colon   POLYPECTOMY  04/29/2020   Procedure: POLYPECTOMY;  Surgeon: Harvel Quale, MD;  [facility];  Service: Gastroenterology;;   SHOULDER ARTHROSCOPY Right    SHOULDER ARTHROSCOPY Right    x2   SKIN BIOPSY     TONSILLECTOMY     TOTAL KNEE ARTHROPLASTY Right 09/08/2018   Procedure: RIGHT TOTAL KNEE ARTHROPLASTY;  Surgeon: Rodell Perna  C, MD;  Location: [place];  Service: Orthopedics;  Laterality: Right;   TOTAL SHOULDER ARTHROPLASTY Right 02/16/2013   Procedure: TOTAL SHOULDER ARTHROPLASTY;  Surgeon: Marybelle Killings, MD;  Location: [place];  Service: Orthopedics;  Laterality: Right;  Right Total Shoulder Arthroplasty, Cemented   WEIL OSTEOTOMY Right 07/18/2021   Procedure: RIGHT FOOT REVISION WEIL OSTEOTOMY 2ND AND 3RD METATARSAL, GASTROCNEMIUS RECESSION, PROXIMAL INTERPHALANGEAL RESECTION 2ND AND 3RD TOES;  Surgeon: Newt Minion, MD;  Location: [place];  Service: Orthopedics;  Laterality: Right;  regional with monitored anesthesia care   Social History   Occupational History   Not on file  Tobacco Use   Smoking status: Never   Smokeless tobacco: Never  Vaping Use   Vaping Use: Never used  Substance and Sexual Activity   Alcohol use: No   Drug use: No   Sexual activity: Yes    Birth control/protection: Surgical

## 2021-07-25 NOTE — Telephone Encounter (Signed)
Please advise, you saw this pt today 07/25/21

## 2021-07-25 NOTE — Telephone Encounter (Signed)
Pt calling for her pain medicine and the antibiotic that was suppose to be called in. She said that she had to rush out of here to get a CT scan.   Cb [phone]

## 2021-07-25 NOTE — Progress Notes (Signed)
Lower extremity venous right study completed.  Attempted to call results to Coralyn Pear, NP x 2.   See CV Proc for preliminary results report.   Darlin Coco, RDMS, RVT

## 2021-07-25 NOTE — Telephone Encounter (Signed)
Did you want to call in rx for ABx and pain medication for this pt? She is s/p gastroc ad weil osteotomy and was sch for doppler today.

## 2021-07-26 ENCOUNTER — Other Ambulatory Visit: Payer: Self-pay | Admitting: Family

## 2021-07-26 ENCOUNTER — Telehealth: Payer: Self-pay | Admitting: Family

## 2021-07-26 MED ORDER — DOXYCYCLINE HYCLATE 100 MG PO TABS: 100.0000 mg | ORAL_TABLET | Freq: Two times a day (BID) | ORAL | 0 refills | Status: DC

## 2021-07-26 MED ORDER — HYDROCODONE-ACETAMINOPHEN 5-325 MG PO TABS: 1.0000 | ORAL_TABLET | Freq: Four times a day (QID) | ORAL | 0 refills | Status: DC | PRN

## 2021-07-26 NOTE — Telephone Encounter (Signed)
Patient called asked if she can get a call when the Antibiotic and pain medicine is sent to the pharmacy.     Note: Patient said she did not have a blood clot.  Patient also wanted to know when does she need to make another appointment to come back in the office for a follow up? Patient said her right foot is still swelling and painful. The number to contact patient is [phone]

## 2021-07-26 NOTE — Addendum Note (Signed)
Addended by: Suzan Slick on: 07/26/2021 08:19 AM   Modules accepted: Orders

## 2021-07-26 NOTE — Telephone Encounter (Signed)
Called pt to advise that rx has been sent to pharm and made an appt for follow up for 08/08/21 at  1:15

## 2021-08-01 DIAGNOSIS — H35433 Paving stone degeneration of retina, bilateral: Secondary | ICD-10-CM | POA: Diagnosis not present

## 2021-08-01 DIAGNOSIS — H43813 Vitreous degeneration, bilateral: Secondary | ICD-10-CM | POA: Diagnosis not present

## 2021-08-01 DIAGNOSIS — H353112 Nonexudative age-related macular degeneration, right eye, intermediate dry stage: Secondary | ICD-10-CM | POA: Diagnosis not present

## 2021-08-01 DIAGNOSIS — H353221 Exudative age-related macular degeneration, left eye, with active choroidal neovascularization: Secondary | ICD-10-CM | POA: Diagnosis not present

## 2021-08-08 ENCOUNTER — Ambulatory Visit (INDEPENDENT_AMBULATORY_CARE_PROVIDER_SITE_OTHER): Payer: Medicare PPO | Admitting: Orthopedic Surgery

## 2021-08-08 DIAGNOSIS — M205X1 Other deformities of toe(s) (acquired), right foot: Secondary | ICD-10-CM

## 2021-08-08 DIAGNOSIS — N183 Chronic kidney disease, stage 3 unspecified: Secondary | ICD-10-CM | POA: Diagnosis not present

## 2021-08-08 DIAGNOSIS — Z13220 Encounter for screening for lipoid disorders: Secondary | ICD-10-CM | POA: Diagnosis not present

## 2021-08-09 MED ORDER — HYDROCODONE-ACETAMINOPHEN 5-325 MG PO TABS: 1.0000 | ORAL_TABLET | Freq: Three times a day (TID) | ORAL | 0 refills | Status: DC | PRN

## 2021-08-09 NOTE — Telephone Encounter (Signed)
This pt had revision weil osteotomy and PIP resection with gastro release last month asking for refill on pain medication can you please advise?

## 2021-08-09 NOTE — Telephone Encounter (Signed)
Patient would like Rx refill on pain medication

## 2021-08-11 DIAGNOSIS — R300 Dysuria: Secondary | ICD-10-CM | POA: Diagnosis not present

## 2021-08-11 DIAGNOSIS — I10 Essential (primary) hypertension: Secondary | ICD-10-CM | POA: Diagnosis not present

## 2021-08-11 DIAGNOSIS — N183 Chronic kidney disease, stage 3 unspecified: Secondary | ICD-10-CM | POA: Diagnosis not present

## 2021-08-11 DIAGNOSIS — M159 Polyosteoarthritis, unspecified: Secondary | ICD-10-CM | POA: Diagnosis not present

## 2021-08-11 DIAGNOSIS — Z6838 Body mass index (BMI) 38.0-38.9, adult: Secondary | ICD-10-CM | POA: Diagnosis not present

## 2021-08-11 DIAGNOSIS — M5136 Other intervertebral disc degeneration, lumbar region: Secondary | ICD-10-CM | POA: Diagnosis not present

## 2021-08-11 DIAGNOSIS — F339 Major depressive disorder, recurrent, unspecified: Secondary | ICD-10-CM | POA: Diagnosis not present

## 2021-08-11 DIAGNOSIS — Z981 Arthrodesis status: Secondary | ICD-10-CM | POA: Diagnosis not present

## 2021-08-11 DIAGNOSIS — I5032 Chronic diastolic (congestive) heart failure: Secondary | ICD-10-CM | POA: Diagnosis not present

## 2021-08-22 ENCOUNTER — Encounter: Payer: Self-pay | Admitting: Orthopedic Surgery

## 2021-08-22 NOTE — Progress Notes (Signed)
Office Visit Note   Patient: [name] [name]           Date of Birth: 1940-03-15           MRN: [id number] Visit Date: 08/08/2021              Requested by: Curlene Labrum, MD [place],  [place] [id number] PCP: Curlene Labrum, MD  Chief Complaint  Patient presents with   Right Foot - Routine Post Op    07/18/2021 right foot revision weil osteotomy 2nd 3rd MT, gastroc recession PIP resection 2nd and 3rd toes       HPI: Patient is an 81 year old woman who presents 3 weeks status post Weil osteotomy second and third metatarsals gastrocnemius recession and PIP resection of the second and third toes and Aiken osteotomy of the proximal phalanx great toe, she is using a rolling walker postoperative shoe  Assessment & Plan: Visit Diagnoses:  1. Claw toe, acquired, right     Plan: Pins are removed she was given a mouse pad to keep the toes plantarflexed.  Follow-Up Instructions: Return in about 2 weeks (around 08/22/2021).   Ortho Exam  Patient is alert, oriented, no adenopathy, well-dressed, normal affect, normal respiratory effort. Examination the incisions are well-healed there is no redness or cellulitis.  The pins were removed a mouse pad was applied to keep the second toe from floating.  Prescription for Vicodin for pain.  Imaging: No results found. No images are attached to the encounter.  Labs: Lab Results  Component Value Date   ESRSEDRATE 6 09/08/2020   ESRSEDRATE 3 10/11/2016   LABURIC 5.7 09/08/2020   REPTSTATUS 07/19/2015 FINAL 07/14/2015   REPTSTATUS 07/19/2015 FINAL 07/14/2015   CULT NO GROWTH 5 DAYS 07/14/2015   CULT NO GROWTH 5 DAYS 07/14/2015   LABORGA ACINETOBACTER LWOFFII 12/28/2013     Lab Results  Component Value Date   ALBUMIN 4.0 09/02/2018   ALBUMIN 3.9 08/05/2017   ALBUMIN 3.9 08/03/2017    Lab Results  Component Value Date   MG 1.6 (L) 02/05/2017   MG 2.4 12/29/2013   MG 1.4 (L) 12/28/2013   No results found for:  "VD25OH"  No results found for: "PREALBUMIN"    Latest Ref Rng & Units 09/08/2020    4:50 PM 04/15/2020    2:15 PM 10/08/2019   11:40 AM  CBC EXTENDED  WBC 3.8 - 10.8 Thousand/uL 4.9  4.4  5.0   RBC 3.80 - 5.10 Million/uL 4.93  4.68  4.59   Hemoglobin 11.7 - 15.5 g/dL 14.5  13.8  11.8   HCT 35.0 - 45.0 % 44.0  42.4  37.3   Platelets 140 - 400 Thousand/uL 152  133  182   NEUT# 1,500 - 7,800 cells/uL 3,136  2,834  3,300   Lymph# 850 - 3,900 cells/uL 789  805  835      There is no height or weight on file to calculate BMI.  Orders:  No orders of the defined types were placed in this encounter.  No orders of the defined types were placed in this encounter.    Procedures: No procedures performed  Clinical Data: No additional findings.  ROS:  All other systems negative, except as noted in the HPI. Review of Systems  Objective: Vital Signs: There were no vitals taken for this visit.  Specialty Comments:  No specialty comments available.  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   History of arthroplasty of right shoulder 08/14/2018   History of colonic polyps 08/14/2017   Choledocholithiasis    Cholestasis 02/05/2017   Depression 02/05/2017   Hypomagnesemia 02/05/2017   Bradycardia 02/05/2017   Transaminitis 07/08/2016   Constipation 07/08/2016   GIB (gastrointestinal bleeding) 07/08/2016   HCAP (healthcare-associated pneumonia) 09/23/2015   AKI (acute  kidney injury) ([place]) 07/21/2015   Hypokalemia 07/21/2015   CAP (community acquired pneumonia) 07/14/2015   Upper abdominal pain 32/44/0102   Folliculitis of perineum 02/24/2014   Giant comedone 12/21/2013   Sebaceous gland hyperplasia of vulva 11/25/2013   Acrochordon 11/25/2013   Dysphagia 10/29/2013   Class 2 severe obesity due to excess calories with serious comorbidity and body mass index (BMI) of 38.0 to 38.9 in adult ([place]) 10/02/2013   Primary hypertension    GERD (gastroesophageal reflux disease)    Anxiety    Anemia of chronic disease    Past Medical History:  Diagnosis Date   Anemia    Anxiety    Arthritis    Barrett's esophagus    seen on [facility]d at [facility]   Depression    GERD (gastroesophageal reflux disease)    H/O hiatal hernia    Hypertension    Obesity    Pneumonia    PONV (postoperative nausea and vomiting)    Seasonal allergies    Sepsis ([place]) 10/02/2013    Family History  Problem Relation Age of Onset   Colon cancer Brother        IN HIS 60s-METASTATIC   Colon polyps Paternal Grandfather    Breast cancer Mother    Hypertension Father    Heart disease Father    Hypertension Brother    Heart disease Brother     Past Surgical History:  Procedure Laterality Date   ABDOMINAL HYSTERECTOMY     ABDOMINAL SURGERY     APPENDECTOMY     BACK SURGERY     lumbar x2 by Dr Arnoldo Morale   BIOPSY  09/03/2019   Procedure: BIOPSY;  Surgeon: Rogene Houston, MD;  [facility];  Service: Endoscopy;;  esophagus   CARPAL TUNNEL RELEASE Right 03/2014   Dr. Lorin Mercy   CATARACT EXTRACTION W/PHACO Left 08/06/2013   Procedure: CATARACT EXTRACTION PHACO AND INTRAOCULAR LENS PLACEMENT LEFT EYE;  Surgeon: Tonny Branch, MD;  [facility]: [facility];  Service: Ophthalmology;  Laterality: Left;  CDE: 9.55   CATARACT EXTRACTION W/PHACO Right 08/24/2013   Procedure: CATARACT EXTRACTION PHACO AND INTRAOCULAR LENS PLACEMENT (IOC);  Surgeon: Tonny Branch, MD;  [facility]: [facility];  Service:  Ophthalmology;  Laterality: Right;  CDE:8.30   CERVICAL FUSION     CHOLECYSTECTOMY     COLONOSCOPY N/A 11/03/2013   SLF: 1. Normal mucosa in the terminal ileum. 2. 13 colon polyps removed. 3. Moderate diverticulosis in the descending colon and sigmoid colon 4. the left colon is redundant 5. Small internal  hemorrhoids.   COLONOSCOPY N/A 09/25/2017   Procedure: COLONOSCOPY;  Surgeon: Rogene Houston, MD;  [facility];  Service: Endoscopy;  Laterality: N/A;  1:55   COLONOSCOPY WITH PROPOFOL N/A 04/29/2020   Castaneda: multiple polyps (tubular adenoma, sessile serrated, hyperplastic) ascending colon, transverse colon, descending colon, diverticulsos in sigmoid and descending colon, rectum/anal vergse normal   ESOPHAGOGASTRODUODENOSCOPY N/A 11/03/2013   SLF: 1. 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 Mild Non-erosive gastritis   ESOPHAGOGASTRODUODENOSCOPY  10/2014   [facility]: IMPRESSIONS: - likely small distal esophageal diverticulum without evidence of fistula, +barrett's esophagus without dysplasia. s/p gastric bypass   ESOPHAGOGASTRODUODENOSCOPY (EGD) WITH PROPOFOL N/A 09/03/2019   rehman: normal hypopharynx, esophagus, mid esophagus, diverticulum in distal esophagus, short segment barretts esophagus, gastric bypass with small sized pouch, gastrojejunal anastomosis with healthy appearing mucosa, normal gastric body and jejunum   EYE SURGERY     GASTRIC BYPASS  1979   revision in [place] in 2011   JOINT REPLACEMENT Right 1995   hip   JOINT REPLACEMENT Left 2008   hip   MEDIAL PARTIAL KNEE REPLACEMENT Left    Dr Ronnie Derby   PARAESOPHAGEAL HERNIA REPAIR  SEP 2010 DR. MCNATT   POLYPECTOMY  09/25/2017   Procedure: POLYPECTOMY;  Surgeon: Rogene Houston, MD;  [facility];  Service: Endoscopy;;  colon   POLYPECTOMY  04/29/2020   Procedure: POLYPECTOMY;  Surgeon: Harvel Quale, MD;  [facility];  Service: Gastroenterology;;   SHOULDER ARTHROSCOPY Right    SHOULDER ARTHROSCOPY Right    x2   SKIN BIOPSY     TONSILLECTOMY     TOTAL KNEE ARTHROPLASTY Right 09/08/2018   Procedure: RIGHT TOTAL KNEE ARTHROPLASTY;  Surgeon: Marybelle Killings, MD;  Location: [place];  Service: Orthopedics;  Laterality: Right;   TOTAL SHOULDER ARTHROPLASTY Right 02/16/2013   Procedure: TOTAL SHOULDER ARTHROPLASTY;  Surgeon: Marybelle Killings, MD;  Location: [place];  Service: Orthopedics;  Laterality: Right;  Right Total Shoulder Arthroplasty, Cemented   WEIL OSTEOTOMY Right 07/18/2021   Procedure: RIGHT FOOT REVISION WEIL OSTEOTOMY 2ND AND 3RD METATARSAL, GASTROCNEMIUS RECESSION, PROXIMAL INTERPHALANGEAL RESECTION 2ND AND 3RD TOES;  Surgeon: Newt Minion, MD;  Location: [place];  Service: Orthopedics;  Laterality: Right;  regional with monitored anesthesia care   Social History   Occupational History   Not on file  Tobacco Use   Smoking status: Never   Smokeless tobacco: Never  Vaping Use   Vaping Use: Never used  Substance and Sexual Activity   Alcohol use: No   Drug use: No   Sexual activity: Yes    Birth control/protection: Surgical

## 2021-08-29 ENCOUNTER — Ambulatory Visit (INDEPENDENT_AMBULATORY_CARE_PROVIDER_SITE_OTHER): Payer: Medicare PPO | Admitting: Orthopedic Surgery

## 2021-08-29 DIAGNOSIS — M205X1 Other deformities of toe(s) (acquired), right foot: Secondary | ICD-10-CM

## 2021-08-30 ENCOUNTER — Telehealth: Payer: Self-pay | Admitting: Orthopedic Surgery

## 2021-08-30 MED ORDER — HYDROCODONE-ACETAMINOPHEN 5-325 MG PO TABS: 1.0000 | ORAL_TABLET | Freq: Three times a day (TID) | ORAL | 0 refills | Status: DC | PRN

## 2021-08-30 NOTE — Addendum Note (Signed)
Addended by: Dondra Prader R on: 08/30/2021 11:17 AM   Modules accepted: Orders

## 2021-08-30 NOTE — Telephone Encounter (Signed)
Pt called and would like a refill on her pain medicine. She states Sharol Given was going to send it in yesterday when she was here.

## 2021-08-30 NOTE — Telephone Encounter (Signed)
Can you please fill? Pt was in the office yesterday. See below.

## 2021-09-06 ENCOUNTER — Encounter: Payer: Self-pay | Admitting: Orthopedic Surgery

## 2021-09-06 NOTE — Progress Notes (Signed)
Office Visit Note   Patient: [name] [name]           Date of Birth: [birth year]           MRN: [id number] Visit Date: 08/29/2021              Requested by: Curlene Labrum, MD [place],  [place] [id number] PCP: Curlene Labrum, MD  Chief Complaint  Patient presents with   Right Foot - Routine Post Op    07/18/2021 right foot revision weil osteotomy 2nd 3rd MT, gastroc recession PIP resection 2nd and 3rd toes       HPI: Patient is an 81 year old woman who is seen 16 weeks status post revision Weil osteotomy of the second and third metatarsals with a gastrocnemius recession.  Patient is currently weightbearing with a rolling walker and postoperative shoes.  Patient complains of a abrasion over her shin she states she struck her leg several days ago.  Assessment & Plan: Visit Diagnoses:  1. Claw toe, acquired, right     Plan: Patient has had an ultrasound that was negative for DVT.  The abrasion to the anterior tibia should resolve.  Recommend Achilles stretching and stretching of her toes, patient may advance to regular shoewear as tolerated.  Follow-Up Instructions: Return in about 4 weeks (around 09/26/2021).   Ortho Exam  Patient is alert, oriented, no adenopathy, well-dressed, normal affect, normal respiratory effort. Examination patient's toes are straight the incisions are healed.  Patient is complaining of pain in her right total knee.  Recommended physical therapy for quad and hamstring strengthening.  Patient states she would like to do this on her own.  Imaging: No results found. No images are attached to the encounter.  Labs: Lab Results  Component Value Date   ESRSEDRATE 6 09/08/2020   ESRSEDRATE 3 10/11/2016   LABURIC 5.7 09/08/2020   REPTSTATUS 07/19/2015 FINAL 07/14/2015   REPTSTATUS 07/19/2015 FINAL 07/14/2015   CULT NO GROWTH 5 DAYS 07/14/2015   CULT NO GROWTH 5 DAYS 07/14/2015   LABORGA ACINETOBACTER LWOFFII 12/28/2013     Lab Results   Component Value Date   ALBUMIN 4.0 09/02/2018   ALBUMIN 3.9 08/05/2017   ALBUMIN 3.9 08/03/2017    Lab Results  Component Value Date   MG 1.6 (L) 02/05/2017   MG 2.4 12/29/2013   MG 1.4 (L) 12/28/2013   No results found for: "VD25OH"  No results found for: "PREALBUMIN"    Latest Ref Rng & Units 09/08/2020    4:50 PM 04/15/2020    2:15 PM 10/08/2019   11:40 AM  CBC EXTENDED  WBC 3.8 - 10.8 Thousand/uL 4.9  4.4  5.0   RBC 3.80 - 5.10 Million/uL 4.93  4.68  4.59   Hemoglobin 11.7 - 15.5 g/dL 14.5  13.8  11.8   HCT 35.0 - 45.0 % 44.0  42.4  37.3   Platelets 140 - 400 Thousand/uL 152  133  182   NEUT# 1,500 - 7,800 cells/uL 3,136  2,834  3,300   Lymph# 850 - 3,900 cells/uL 789  805  835      There is no height or weight on file to calculate BMI.  Orders:  No orders of the defined types were placed in this encounter.  No orders of the defined types were placed in this encounter.    Procedures: No procedures performed  Clinical Data: No additional findings.  ROS:  All other systems negative, except as noted in  the HPI. Review of Systems  Objective: Vital Signs: There were no vitals taken for this visit.  Specialty Comments:  No specialty comments available.  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) 07/21/2015   Hypokalemia 07/21/2015   CAP (community acquired pneumonia) 07/14/2015   Upper abdominal pain 16/09/3708   Folliculitis of perineum 02/24/2014   Giant comedone 12/21/2013   Sebaceous gland hyperplasia of vulva 11/25/2013   Acrochordon 11/25/2013   Dysphagia 10/29/2013   Class 2 severe obesity due to excess calories with serious comorbidity and body mass index (BMI) of 38.0 to 38.9 in adult ([place]) 10/02/2013   Primary hypertension    GERD (gastroesophageal reflux disease)    Anxiety    Anemia of chronic disease    Past Medical History:  Diagnosis Date   Anemia    Anxiety    Arthritis    Barrett's esophagus    [facility]d [facility]   Depression    GERD (gastroesophageal reflux disease)    H/O hiatal hernia    Hypertension    Obesity    Pneumonia    PONV (postoperative nausea and vomiting)    Seasonal allergies    Sepsis ([place]) 10/02/2013    Family History  Problem Relation Age of Onset   Colon cancer Brother        IN HIS 60s-METASTATIC   Colon polyps Paternal Grandfather    Breast cancer Mother    Hypertension Father    Heart disease Father    Hypertension Brother    Heart disease Brother     Past Surgical History:  Procedure Laterality Date   ABDOMINAL HYSTERECTOMY     ABDOMINAL SURGERY     APPENDECTOMY     BACK SURGERY     lumbar x2 by Dr Arnoldo Morale   BIOPSY  09/03/2019   Procedure: BIOPSY;  Surgeon: Rogene Houston, MD;  [facility];  Service: Endoscopy;;  esophagus   CARPAL TUNNEL RELEASE Right 03/2014   Dr. Lorin Mercy   CATARACT EXTRACTION W/PHACO Left 08/06/2013   Procedure: CATARACT EXTRACTION PHACO AND INTRAOCULAR LENS PLACEMENT LEFT EYE;  Surgeon: Tonny Branch, MD;   [facility];  Service: Ophthalmology;  Laterality: Left;  CDE: 9.55   CATARACT EXTRACTION W/PHACO Right 08/24/2013   Procedure: CATARACT EXTRACTION PHACO AND INTRAOCULAR LENS PLACEMENT (IOC);  Surgeon: Tonny Branch, MD;  [facility]: [facility];  Service: Ophthalmology;  Laterality: Right;  CDE:8.30   CERVICAL FUSION     CHOLECYSTECTOMY     COLONOSCOPY N/A 11/03/2013   SLF: 1. Normal mucosa in the terminal ileum. 2. 13  colon polyps removed. 3. Moderate diverticulosis in the descending colon and sigmoid colon 4. the left colon is redundant 5. Small internal  hemorrhoids.   COLONOSCOPY N/A 09/25/2017   Procedure: COLONOSCOPY;  Surgeon: Rogene Houston, MD;  [facility];  Service: Endoscopy;  Laterality: N/A;  1:55   COLONOSCOPY WITH PROPOFOL N/A 04/29/2020   Castaneda: multiple polyps (tubular adenoma, sessile serrated, hyperplastic) ascending colon, transverse colon, descending colon, diverticulsos in sigmoid and descending colon, rectum/anal vergse normal   ESOPHAGOGASTRODUODENOSCOPY N/A 11/03/2013   SLF: 1. 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 Mild Non-erosive gastritis   ESOPHAGOGASTRODUODENOSCOPY  10/2014   [facility]: IMPRESSIONS: - likely small distal esophageal diverticulum without evidence of fistula, +barrett's esophagus without dysplasia. s/p gastric bypass   ESOPHAGOGASTRODUODENOSCOPY (EGD) WITH PROPOFOL N/A 09/03/2019   rehman: normal hypopharynx, esophagus, mid esophagus, diverticulum in distal esophagus, short segment barretts esophagus, gastric bypass with small sized pouch, gastrojejunal anastomosis with healthy appearing mucosa, normal gastric body and jejunum   EYE SURGERY     GASTRIC BYPASS  1979   revision in [place] in 2011   JOINT REPLACEMENT Right 1995   hip   JOINT REPLACEMENT Left 2008   hip   MEDIAL PARTIAL KNEE REPLACEMENT Left    Dr Ronnie Derby   PARAESOPHAGEAL HERNIA  REPAIR  SEP 2010 DR. MCNATT   POLYPECTOMY  09/25/2017   Procedure: POLYPECTOMY;  Surgeon: Rogene Houston, MD;  [facility];  Service: Endoscopy;;  colon   POLYPECTOMY  04/29/2020   Procedure: POLYPECTOMY;  Surgeon: Harvel Quale, MD;  [facility];  Service: Gastroenterology;;   SHOULDER ARTHROSCOPY Right    SHOULDER ARTHROSCOPY Right    x2   SKIN BIOPSY     TONSILLECTOMY     TOTAL KNEE ARTHROPLASTY Right 09/08/2018   Procedure: RIGHT TOTAL KNEE ARTHROPLASTY;  Surgeon: Marybelle Killings, MD;  Location: [place];  Service: Orthopedics;  Laterality: Right;   TOTAL SHOULDER ARTHROPLASTY Right 02/16/2013   Procedure: TOTAL SHOULDER ARTHROPLASTY;  Surgeon: Marybelle Killings, MD;  Location: [place];  Service: Orthopedics;  Laterality: Right;  Right Total Shoulder Arthroplasty, Cemented   WEIL OSTEOTOMY Right 07/18/2021   Procedure: RIGHT FOOT REVISION WEIL OSTEOTOMY 2ND AND 3RD METATARSAL, GASTROCNEMIUS RECESSION, PROXIMAL INTERPHALANGEAL RESECTION 2ND AND 3RD TOES;  Surgeon: Newt Minion, MD;  Location: [place];  Service: Orthopedics;  Laterality: Right;  regional with monitored anesthesia care   Social History   Occupational History   Not on file  Tobacco Use   Smoking status: Never   Smokeless tobacco: Never  Vaping Use   Vaping Use: Never used  Substance and Sexual Activity   Alcohol use: No   Drug use: No   Sexual activity: Yes    Birth control/protection: Surgical

## 2021-09-13 DIAGNOSIS — H35433 Paving stone degeneration of retina, bilateral: Secondary | ICD-10-CM | POA: Diagnosis not present

## 2021-09-13 DIAGNOSIS — H43813 Vitreous degeneration, bilateral: Secondary | ICD-10-CM | POA: Diagnosis not present

## 2021-09-13 DIAGNOSIS — H353112 Nonexudative age-related macular degeneration, right eye, intermediate dry stage: Secondary | ICD-10-CM | POA: Diagnosis not present

## 2021-09-13 DIAGNOSIS — H353221 Exudative age-related macular degeneration, left eye, with active choroidal neovascularization: Secondary | ICD-10-CM | POA: Diagnosis not present

## 2021-09-26 ENCOUNTER — Ambulatory Visit (INDEPENDENT_AMBULATORY_CARE_PROVIDER_SITE_OTHER): Payer: Medicare PPO | Admitting: Orthopedic Surgery

## 2021-09-26 ENCOUNTER — Encounter: Payer: Self-pay | Admitting: Orthopedic Surgery

## 2021-09-26 DIAGNOSIS — M205X1 Other deformities of toe(s) (acquired), right foot: Secondary | ICD-10-CM

## 2021-09-26 NOTE — Progress Notes (Signed)
Office Visit Note   Patient: [name] [name]           Date of Birth: [birth year]           MRN: [id number] Visit Date: 09/26/2021              Requested by: Curlene Labrum, MD [place],  [place] [address] PCP: Curlene Labrum, MD  Chief Complaint  Patient presents with   Right Foot - Routine Post Op    07/18/21 right revision weil osteotomy 2,3 MT, gastroc recession PEP resection 2,3 toes      HPI: Patient is an 81 year old woman who presents 2 and half months status post revision Weil osteotomy second third metatarsals gastrocnemius recession and PIP resections.  Patient is working on Achilles stretching.  She is currently in a postoperative shoe.  Assessment & Plan: Visit Diagnoses:  1. Claw toe, acquired, right     Plan: Discussed that she can advance to regular shoewear as the swelling resolves continue working on Achilles stretching and recommended compression for the venous insufficiency.  Follow-Up Instructions: Return if symptoms worsen or fail to improve.   Ortho Exam  Patient is alert, oriented, no adenopathy, well-dressed, normal affect, normal respiratory effort. Examination patient's toes are straight the incisions are well-healed she does have varicose veins with brawny skin color changes she has swelling in the foot and calf.  Imaging: No results found. No images are attached to the encounter.  Labs: Lab Results  Component Value Date   ESRSEDRATE 6 09/08/2020   ESRSEDRATE 3 10/11/2016   LABURIC 5.7 09/08/2020   REPTSTATUS 07/19/2015 FINAL 07/14/2015   REPTSTATUS 07/19/2015 FINAL 07/14/2015   CULT NO GROWTH 5 DAYS 07/14/2015   CULT NO GROWTH 5 DAYS 07/14/2015   LABORGA ACINETOBACTER LWOFFII 12/28/2013     Lab Results  Component Value Date   ALBUMIN 4.0 09/02/2018   ALBUMIN 3.9 08/05/2017   ALBUMIN 3.9 08/03/2017    Lab Results  Component Value Date   MG 1.6 (L) 02/05/2017   MG 2.4 12/29/2013   MG 1.4 (L) 12/28/2013   No results  found for: "VD25OH"  No results found for: "PREALBUMIN"    Latest Ref Rng & Units 09/08/2020    4:50 PM 04/15/2020    2:15 PM 10/08/2019   11:40 AM  CBC EXTENDED  WBC 3.8 - 10.8 Thousand/uL 4.9  4.4  5.0   RBC 3.80 - 5.10 Million/uL 4.93  4.68  4.59   Hemoglobin 11.7 - 15.5 g/dL 14.5  13.8  11.8   HCT 35.0 - 45.0 % 44.0  42.4  37.3   Platelets 140 - 400 Thousand/uL 152  133  182   NEUT# 1,500 - 7,800 cells/uL 3,136  2,834  3,300   Lymph# 850 - 3,900 cells/uL 789  805  835      There is no height or weight on file to calculate BMI.  Orders:  No orders of the defined types were placed in this encounter.  No orders of the defined types were placed in this encounter.    Procedures: No procedures performed  Clinical Data: No additional findings.  ROS:  All other systems negative, except as noted in the HPI. Review of Systems  Objective: Vital Signs: There were no vitals taken for this visit.  Specialty Comments:  No specialty comments available.  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   History of arthroplasty of right shoulder 08/14/2018   History of colonic polyps 08/14/2017   Choledocholithiasis    Cholestasis 02/05/2017   Depression 02/05/2017   Hypomagnesemia 02/05/2017   Bradycardia 02/05/2017   Transaminitis 07/08/2016   Constipation 07/08/2016   GIB (gastrointestinal bleeding) 07/08/2016   HCAP (healthcare-associated pneumonia) 09/23/2015   AKI  (acute kidney injury) ([place]) 07/21/2015   Hypokalemia 07/21/2015   CAP (community acquired pneumonia) 07/14/2015   Upper abdominal pain 14/48/1856   Folliculitis of perineum 02/24/2014   Giant comedone 12/21/2013   Sebaceous gland hyperplasia of vulva 11/25/2013   Acrochordon 11/25/2013   Dysphagia 10/29/2013   Class 2 severe obesity due to excess calories with serious comorbidity and body mass index (BMI) of 38.0 to 38.9 in adult ([place]) 10/02/2013   Primary hypertension    GERD (gastroesophageal reflux disease)    Anxiety    Anemia of chronic disease    Past Medical History:  Diagnosis Date   Anemia    Anxiety    Arthritis    Barrett's esophagus    [facility]d [facility]   Depression    GERD (gastroesophageal reflux disease)    H/O hiatal hernia    Hypertension    Obesity    Pneumonia    PONV (postoperative nausea and vomiting)    Seasonal allergies    Sepsis ([place]) 10/02/2013    Family History  Problem Relation Age of Onset   Colon cancer Brother        IN HIS 60s-METASTATIC   Colon polyps Paternal Grandfather    Breast cancer Mother    Hypertension Father    Heart disease Father    Hypertension Brother    Heart disease Brother     Past Surgical History:  Procedure Laterality Date   ABDOMINAL HYSTERECTOMY     ABDOMINAL SURGERY     APPENDECTOMY     BACK SURGERY     lumbar x2 by Dr Arnoldo Morale   BIOPSY  09/03/2019   Procedure: BIOPSY;  Surgeon: Rogene Houston, MD;  [facility];  Service: Endoscopy;;  esophagus   CARPAL TUNNEL RELEASE Right 03/2014   Dr. Lorin Mercy   CATARACT EXTRACTION W/PHACO Left 08/06/2013   Procedure: CATARACT EXTRACTION PHACO AND INTRAOCULAR LENS PLACEMENT LEFT EYE;  Surgeon: Tonny Branch, MD;  [facility];  Service: Ophthalmology;  Laterality: Left;  CDE: 9.55   CATARACT EXTRACTION W/PHACO Right 08/24/2013   Procedure: CATARACT EXTRACTION PHACO AND INTRAOCULAR LENS PLACEMENT (IOC);  Surgeon: Tonny Branch, MD;  [facility];   Service: Ophthalmology;  Laterality: Right;  CDE:8.30   CERVICAL FUSION     CHOLECYSTECTOMY     COLONOSCOPY N/A 11/03/2013   SLF: 1. Normal mucosa in the terminal ileum. 2. 13 colon polyps removed. 3. Moderate diverticulosis in the descending colon and sigmoid colon 4. the left colon is redundant 5. Small internal  hemorrhoids.   COLONOSCOPY N/A 09/25/2017   Procedure: COLONOSCOPY;  Surgeon: Rogene Houston, MD;  [facility];  Service:  Endoscopy;  Laterality: N/A;  1:55   COLONOSCOPY WITH PROPOFOL N/A 04/29/2020   Castaneda: multiple polyps (tubular adenoma, sessile serrated, hyperplastic) ascending colon, transverse colon, descending colon, diverticulsos in sigmoid and descending colon, rectum/anal vergse normal   ESOPHAGOGASTRODUODENOSCOPY N/A 11/03/2013   SLF: 1. 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 Mild Non-erosive gastritis   ESOPHAGOGASTRODUODENOSCOPY  10/2014   [facility]: IMPRESSIONS: - likely small distal esophageal diverticulum without evidence of fistula, +barrett's esophagus without dysplasia. s/p gastric bypass   ESOPHAGOGASTRODUODENOSCOPY (EGD) WITH PROPOFOL N/A 09/03/2019   rehman: normal hypopharynx, esophagus, mid esophagus, diverticulum in distal esophagus, short segment barretts esophagus, gastric bypass with small sized pouch, gastrojejunal anastomosis with healthy appearing mucosa, normal gastric body and jejunum   EYE SURGERY     GASTRIC BYPASS  1979   revision in [place] in 2011   JOINT REPLACEMENT Right 1995   hip   JOINT REPLACEMENT Left 2008   hip   MEDIAL PARTIAL KNEE REPLACEMENT Left    Dr Ronnie Derby   PARAESOPHAGEAL HERNIA REPAIR  SEP 2010 DR. MCNATT   POLYPECTOMY  09/25/2017   Procedure: POLYPECTOMY;  Surgeon: Rogene Houston, MD;  [facility];  Service: Endoscopy;;  colon   POLYPECTOMY  04/29/2020   Procedure: POLYPECTOMY;  Surgeon: Harvel Quale, MD;  [facility];  Service: Gastroenterology;;   SHOULDER ARTHROSCOPY Right    SHOULDER ARTHROSCOPY Right    x2   SKIN BIOPSY     TONSILLECTOMY     TOTAL KNEE ARTHROPLASTY Right 09/08/2018   Procedure: RIGHT TOTAL KNEE ARTHROPLASTY;  Surgeon: Marybelle Killings, MD;  Location: [place];  Service: Orthopedics;  Laterality: Right;   TOTAL SHOULDER ARTHROPLASTY Right 02/16/2013   Procedure: TOTAL SHOULDER ARTHROPLASTY;  Surgeon: Marybelle Killings, MD;  Location: [place];  Service: Orthopedics;  Laterality: Right;  Right Total Shoulder Arthroplasty, Cemented   WEIL OSTEOTOMY Right 07/18/2021   Procedure: RIGHT FOOT REVISION WEIL OSTEOTOMY 2ND AND 3RD METATARSAL, GASTROCNEMIUS RECESSION, PROXIMAL INTERPHALANGEAL RESECTION 2ND AND 3RD TOES;  Surgeon: Newt Minion, MD;  Location: [place];  Service: Orthopedics;  Laterality: Right;  regional with monitored anesthesia care   Social History   Occupational History   Not on file  Tobacco Use   Smoking status: Never   Smokeless tobacco: Never  Vaping Use   Vaping Use: Never used  Substance and Sexual Activity   Alcohol use: No   Drug use: No   Sexual activity: Yes    Birth control/protection: Surgical

## 2021-10-02 DIAGNOSIS — N182 Chronic kidney disease, stage 2 (mild): Secondary | ICD-10-CM | POA: Diagnosis not present

## 2021-10-02 DIAGNOSIS — I130 Hypertensive heart and chronic kidney disease with heart failure and stage 1 through stage 4 chronic kidney disease, or unspecified chronic kidney disease: Secondary | ICD-10-CM | POA: Diagnosis not present

## 2021-10-02 DIAGNOSIS — I5032 Chronic diastolic (congestive) heart failure: Secondary | ICD-10-CM | POA: Diagnosis not present

## 2021-10-17 ENCOUNTER — Telehealth: Payer: Self-pay | Admitting: *Deleted

## 2021-10-17 NOTE — Telephone Encounter (Signed)
PA submitted via [employer]. Pending. Clinicals uploaded. Tracking# [id number]

## 2021-10-19 NOTE — Telephone Encounter (Signed)
PA approved. Auth#  [id number], DOS: Oct 17 2021 - Nov 16 2021

## 2021-10-19 NOTE — Telephone Encounter (Signed)
Thank you :)

## 2021-10-20 ENCOUNTER — Ambulatory Visit (HOSPITAL_COMMUNITY)
Admission: RE | Admit: 2021-10-20 | Discharge: 2021-10-20 | Disposition: A | Discharge status: Home / Self Care | Payer: Medicare PPO | Source: Ambulatory Visit | Attending: Gastroenterology | Admitting: Gastroenterology

## 2021-10-20 DIAGNOSIS — R1010 Upper abdominal pain, unspecified: Secondary | ICD-10-CM | POA: Diagnosis not present

## 2021-10-20 DIAGNOSIS — K5730 Diverticulosis of large intestine without perforation or abscess without bleeding: Secondary | ICD-10-CM | POA: Diagnosis not present

## 2021-10-20 MED ORDER — IOHEXOL 300 MG/ML  SOLN
100.0000 mL | Freq: Once | INTRAMUSCULAR | Status: AC | PRN
  Administered 2021-10-20: 100 mL via INTRAVENOUS

## 2021-11-01 DIAGNOSIS — E876 Hypokalemia: Secondary | ICD-10-CM | POA: Diagnosis not present

## 2021-11-01 DIAGNOSIS — I10 Essential (primary) hypertension: Secondary | ICD-10-CM | POA: Diagnosis not present

## 2021-11-01 DIAGNOSIS — D649 Anemia, unspecified: Secondary | ICD-10-CM | POA: Diagnosis not present

## 2021-11-01 DIAGNOSIS — R739 Hyperglycemia, unspecified: Secondary | ICD-10-CM | POA: Diagnosis not present

## 2021-11-01 DIAGNOSIS — K219 Gastro-esophageal reflux disease without esophagitis: Secondary | ICD-10-CM | POA: Diagnosis not present

## 2021-11-01 DIAGNOSIS — N189 Chronic kidney disease, unspecified: Secondary | ICD-10-CM | POA: Diagnosis not present

## 2021-11-01 DIAGNOSIS — R5382 Chronic fatigue, unspecified: Secondary | ICD-10-CM | POA: Diagnosis not present

## 2021-11-08 DIAGNOSIS — M810 Age-related osteoporosis without current pathological fracture: Secondary | ICD-10-CM | POA: Diagnosis not present

## 2021-11-08 DIAGNOSIS — F339 Major depressive disorder, recurrent, unspecified: Secondary | ICD-10-CM | POA: Diagnosis not present

## 2021-11-08 DIAGNOSIS — I10 Essential (primary) hypertension: Secondary | ICD-10-CM | POA: Diagnosis not present

## 2021-11-08 DIAGNOSIS — I5032 Chronic diastolic (congestive) heart failure: Secondary | ICD-10-CM | POA: Diagnosis not present

## 2021-11-08 DIAGNOSIS — M159 Polyosteoarthritis, unspecified: Secondary | ICD-10-CM | POA: Diagnosis not present

## 2021-11-08 DIAGNOSIS — Z6838 Body mass index (BMI) 38.0-38.9, adult: Secondary | ICD-10-CM | POA: Diagnosis not present

## 2021-11-08 DIAGNOSIS — Z981 Arthrodesis status: Secondary | ICD-10-CM | POA: Diagnosis not present

## 2021-11-08 DIAGNOSIS — M5136 Other intervertebral disc degeneration, lumbar region: Secondary | ICD-10-CM | POA: Diagnosis not present

## 2021-11-10 DIAGNOSIS — H43813 Vitreous degeneration, bilateral: Secondary | ICD-10-CM | POA: Diagnosis not present

## 2021-11-10 DIAGNOSIS — H353231 Exudative age-related macular degeneration, bilateral, with active choroidal neovascularization: Secondary | ICD-10-CM | POA: Diagnosis not present

## 2021-11-10 DIAGNOSIS — H35433 Paving stone degeneration of retina, bilateral: Secondary | ICD-10-CM | POA: Diagnosis not present

## 2021-11-28 DIAGNOSIS — L570 Actinic keratosis: Secondary | ICD-10-CM | POA: Diagnosis not present

## 2021-12-15 DIAGNOSIS — H43813 Vitreous degeneration, bilateral: Secondary | ICD-10-CM | POA: Diagnosis not present

## 2021-12-15 DIAGNOSIS — H35433 Paving stone degeneration of retina, bilateral: Secondary | ICD-10-CM | POA: Diagnosis not present

## 2021-12-15 DIAGNOSIS — H353231 Exudative age-related macular degeneration, bilateral, with active choroidal neovascularization: Secondary | ICD-10-CM | POA: Diagnosis not present

## 2021-12-21 ENCOUNTER — Encounter: Payer: Self-pay | Admitting: *Deleted

## 2021-12-26 ENCOUNTER — Ambulatory Visit: Payer: Medicare PPO | Admitting: Diagnostic Neuroimaging

## 2021-12-26 ENCOUNTER — Encounter: Payer: Self-pay | Admitting: Diagnostic Neuroimaging

## 2021-12-26 VITALS — BP 128/84 | HR 72 | Ht 63.0 in | Wt 221.0 lb

## 2021-12-26 DIAGNOSIS — M542 Cervicalgia: Secondary | ICD-10-CM | POA: Diagnosis not present

## 2021-12-26 DIAGNOSIS — G8929 Other chronic pain: Secondary | ICD-10-CM | POA: Diagnosis not present

## 2021-12-26 DIAGNOSIS — M25511 Pain in right shoulder: Secondary | ICD-10-CM

## 2021-12-26 NOTE — Patient Instructions (Signed)
PAIN IN RIGHT SHOULDER, TRAPEZIUS, NECK - likely musculoskeletal strain; also with history of right shoulder replacement, rotator cuff injury; also with cervical degen spine dz (history of surgeries in 2001 and 2011) - recommend home PT/OT evaluation - follow up with pain mgmt clinic (has seen Dr. Gean Quint and Dr. Brien Few in the past)  TENSION HEADACHES - tylenol as needed

## 2021-12-26 NOTE — Progress Notes (Signed)
[facility]  PATIENT: [name] [name]: 1940-03-11  REFERRING CLINICIAN: Curlene Labrum, MD HISTORY FROM: patient REASON FOR VISIT: new consult   HISTORICAL  CHIEF COMPLAINT:  Chief Complaint  Patient presents with   Chronic headache    Rm 6 New Pt alone  "feels like needles in back of my neck that go up into my head; Tylenol doesn't help; I wake at night and the back of my head is soaking wet; I need a left shoulder replacement-not sure if that is contributing"    HISTORY OF PRESENT ILLNESS:   81 year old female here for evaluation of right-sided shoulder and neck pain.  Symptoms have been going on since around 2018.  She feels like pins-and-needles and shooting pain from her right shoulder, right trapezius and right posterior neck region.  She has had right shoulder replacement surgery in the past.  She has right rotator cuff injury, which needs some treatment.  She has had cervical spine surgeries in 2001-2011.  She feels some stiffness in her neck.  She saw Dr. Arnoldo Morale neurosurgery in March 2022 for evaluation.  She has seen pain management Dr. Brien Few and Dr. Gean Quint in the past for cervical injections which have helped.  Has some ongoing intermittent tension headaches which she uses Tylenol for.   REVIEW OF SYSTEMS: Full 14 system review of systems performed and negative with exception of: as per HPI.  ALLERGIES: Allergies  Allergen Reactions   Gadavist [Gadobutrol] Itching and Swelling    Per patient    Novocain [Procaine Hcl] Other (See Comments)    Unknown-passed out with novocaine injected for dental surgery.   Other Hives and Other (See Comments)    EKG pads - need to use pediatric pads   Latex Rash   Penicillins Rash and Other (See Comments)    Has patient had a PCN reaction causing immediate rash, facial/tongue/throat swelling, SOB or lightheadedness with hypotension: Yes Has patient had a PCN reaction causing severe rash involving mucus  membranes or skin necrosis: No Has patient had a PCN reaction that required hospitalization No Has patient had a PCN reaction occurring within the last 10 years: No  If all of the above answers are "NO", then may proceed with Cephalosporin use.    Sulfa Antibiotics Rash    hives    HOME MEDICATIONS: Outpatient Medications Prior to Visit  Medication Sig Dispense Refill   acetaminophen (TYLENOL) 500 MG tablet Take 1,000 mg by mouth every 6 (six) hours as needed for moderate pain or headache.     albuterol (PROVENTIL) (2.5 MG/3ML) 0.083% nebulizer solution Take 3 mLs (2.5 mg total) by nebulization every 4 (four) hours as needed for wheezing. (Patient taking differently: Take 2.5 mg by nebulization every 6 (six) hours as needed for wheezing.) 75 mL 12   alendronate (FOSAMAX) 70 MG tablet Take 70 mg by mouth every Tuesday.      AZO-CRANBERRY PO Take 2 tablets by mouth daily as needed (kidney infections).      Calcium-Phosphorus-Vitamin D (CITRACAL +D3 PO) Take 1 tablet by mouth daily.     Cholecalciferol (VITAMIN D3) 50 MCG (2000 UT) capsule Take 2,000 Units by mouth daily.      clobetasol (TEMOVATE) 0.05 % external solution SMARTSIG:6-8 Drop(s) Topical Every Night PRN     conjugated estrogens (PREMARIN) vaginal cream Place 1 Applicatorful vaginally at bedtime. Use as directed at bedtime 30 g 12   dicyclomine (BENTYL) 10 MG capsule TAKE ONE CAPSULE BY MOUTH EVERY TWELVE  HOURS AS NEEDED FOR SPASMS 60 capsule 2   docusate sodium (COLACE) 100 MG capsule Take 100 mg by mouth at bedtime as needed for mild constipation.      doxycycline (VIBRA-TABS) 100 MG tablet Take 1 tablet (100 mg total) by mouth 2 (two) times daily. 28 tablet 0   Ferrous Sulfate (SLOW FE PO) Take 1 tablet by mouth daily.     fluticasone (FLONASE) 50 MCG/ACT nasal spray Place 2 sprays into both nostrils daily as needed for allergies.     Lactobacillus (FLORAJEN ACIDOPHILUS) CAPS Take 1 capsule by mouth daily before breakfast.      lisinopril (ZESTRIL) 10 MG tablet Take 10 mg by mouth every morning.     MUCINEX 600 MG 12 hr tablet Take 600 mg by mouth 2 (two) times daily as needed for cough.      naproxen (NAPROSYN) 500 MG tablet Take 500 mg by mouth as needed.     Nystatin (GERHARDT'S BUTT CREAM) CREA Apply 1 application topically 3 (three) times daily as needed for irritation. 1 each 11   nystatin ointment (MYCOSTATIN) Apply 1 application topically 2 (two) times daily as needed (for irritation).     omeprazole (PRILOSEC) 20 MG capsule Take 1 capsule (20 mg total) by mouth daily. 90 capsule 3   ondansetron (ZOFRAN) 4 MG tablet Take 4 mg by mouth every 8 (eight) hours as needed for nausea or vomiting.     sertraline (ZOLOFT) 100 MG tablet Take 100 mg by mouth daily.     traZODone (DESYREL) 50 MG tablet Take 50 mg by mouth at bedtime.     HYDROcodone-acetaminophen (NORCO/VICODIN) 5-325 MG tablet Take 1 tablet by mouth every 8 (eight) hours as needed for moderate pain. (Patient not taking: Reported on 12/26/2021) 30 tablet 0   No facility-administered medications prior to visit.    PAST MEDICAL HISTORY: Past Medical History:  Diagnosis Date   Anemia    Anxiety    Arthritis    Barrett's esophagus    [facility]d [facility]   Depression    GERD (gastroesophageal reflux disease)    H/O hiatal hernia    Headache    Hypertension    Macular degeneration    Obesity    Pneumonia    PONV (postoperative nausea and vomiting)    Seasonal allergies    Sepsis ([place]) 10/02/2013    PAST SURGICAL HISTORY: Past Surgical History:  Procedure Laterality Date   ABDOMINAL HYSTERECTOMY     ABDOMINAL SURGERY     APPENDECTOMY     BACK SURGERY     lumbar x2 by Dr Arnoldo Morale   BIOPSY  09/03/2019   Procedure: BIOPSY;  Surgeon: Rogene Houston, MD;  [facility];  Service: Endoscopy;;  esophagus   CARPAL TUNNEL RELEASE Right 03/2014   Dr. Lorin Mercy   CATARACT EXTRACTION W/PHACO Left 08/06/2013   Procedure: CATARACT  EXTRACTION PHACO AND INTRAOCULAR LENS PLACEMENT LEFT EYE;  Surgeon: Tonny Branch, MD;  [facility];  Service: Ophthalmology;  Laterality: Left;  CDE: 9.55   CATARACT EXTRACTION W/PHACO Right 08/24/2013   Procedure: CATARACT EXTRACTION PHACO AND INTRAOCULAR LENS PLACEMENT (IOC);  Surgeon: Tonny Branch, MD;  [facility];  Service: Ophthalmology;  Laterality: Right;  CDE:8.30   CERVICAL FUSION     CHOLECYSTECTOMY     COLONOSCOPY N/A 11/03/2013   SLF: 1. Normal mucosa in the terminal ileum. 2. 13 colon polyps removed. 3. Moderate diverticulosis in the descending colon and sigmoid  colon 4. the left colon is redundant 5. Small internal  hemorrhoids.   COLONOSCOPY N/A 09/25/2017   Procedure: COLONOSCOPY;  Surgeon: Rogene Houston, MD;  [facility];  Service: Endoscopy;  Laterality: N/A;  1:55   COLONOSCOPY WITH PROPOFOL N/A 04/29/2020   Castaneda: multiple polyps (tubular adenoma, sessile serrated, hyperplastic) ascending colon, transverse colon, descending colon, diverticulsos in sigmoid and descending colon, rectum/anal vergse normal   ESOPHAGOGASTRODUODENOSCOPY N/A 11/03/2013   SLF: 1. 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 Mild Non-erosive gastritis   ESOPHAGOGASTRODUODENOSCOPY  10/2014   [facility]: IMPRESSIONS: - likely small distal esophageal diverticulum without evidence of fistula, +barrett's esophagus without dysplasia. s/p gastric bypass   ESOPHAGOGASTRODUODENOSCOPY (EGD) WITH PROPOFOL N/A 09/03/2019   rehman: normal hypopharynx, esophagus, mid esophagus, diverticulum in distal esophagus, short segment barretts esophagus, gastric bypass with small sized pouch, gastrojejunal anastomosis with healthy appearing mucosa, normal gastric body and jejunum   EYE SURGERY     GASTRIC BYPASS  1979   revision in [place] in 2011   JOINT REPLACEMENT Right 1995   hip   JOINT REPLACEMENT Left 2008    hip   MEDIAL PARTIAL KNEE REPLACEMENT Left    Dr Ronnie Derby   PARAESOPHAGEAL HERNIA REPAIR  SEP 2010 DR. MCNATT   POLYPECTOMY  09/25/2017   Procedure: POLYPECTOMY;  Surgeon: Rogene Houston, MD;  [facility];  Service: Endoscopy;;  colon   POLYPECTOMY  04/29/2020   Procedure: POLYPECTOMY;  Surgeon: Harvel Quale, MD;  [facility];  Service: Gastroenterology;;   SHOULDER ARTHROSCOPY Right    SHOULDER ARTHROSCOPY Right    x2   SKIN BIOPSY     TONSILLECTOMY     TOTAL KNEE ARTHROPLASTY Right 09/08/2018   Procedure: RIGHT TOTAL KNEE ARTHROPLASTY;  Surgeon: Marybelle Killings, MD;  Location: [place];  Service: Orthopedics;  Laterality: Right;   TOTAL SHOULDER ARTHROPLASTY Right 02/16/2013   Procedure: TOTAL SHOULDER ARTHROPLASTY;  Surgeon: Marybelle Killings, MD;  Location: [place];  Service: Orthopedics;  Laterality: Right;  Right Total Shoulder Arthroplasty, Cemented   WEIL OSTEOTOMY Right 07/18/2021   Procedure: RIGHT FOOT REVISION WEIL OSTEOTOMY 2ND AND 3RD METATARSAL, GASTROCNEMIUS RECESSION, PROXIMAL INTERPHALANGEAL RESECTION 2ND AND 3RD TOES;  Surgeon: Newt Minion, MD;  Location: [place];  Service: Orthopedics;  Laterality: Right;  regional with monitored anesthesia care    FAMILY HISTORY: Family History  Problem Relation Age of Onset   Breast cancer Mother    Hypertension Father    Heart disease Father    Hypertension Sister    Colon cancer Brother        IN HIS 60s-METASTATIC   Hypertension Brother    Heart disease Brother    Colon polyps Paternal Grandfather     SOCIAL HISTORY: Social History   Socioeconomic History   Marital status: Single    Spouse name: Not on file   Number of children: 0   Years of education: Not on file   Highest education level: Some college, no degree  Occupational History   Not on file  Tobacco Use   Smoking status: Never   Smokeless tobacco: Never  Vaping Use   Vaping Use: Never used  Substance and  Sexual Activity   Alcohol use: No   Drug use: No   Sexual activity: Yes    Birth control/protection: Surgical  Other Topics Concern   Not on file  Social History Narrative   12/26/21 lives alone   Social Determinants of Health   Financial Resource Strain: Not on file  Food Insecurity: Not on file  Transportation Needs: Not on file  Physical Activity: Not on file  Stress: Not on file  Social Connections: Not on file  Intimate Partner Violence: Not on file     PHYSICAL EXAM  GENERAL EXAM/CONSTITUTIONAL: Vitals:  Vitals:   12/26/21 1121  BP: 128/84  Pulse: 72  Weight: 221 lb (100.2 kg)  Height: '5\' 3"'$  (1.6 m)   Body mass index is 39.15 kg/m. Wt Readings from Last 3 Encounters:  12/26/21 221 lb (100.2 kg)  07/18/21 219 lb 5.7 oz (99.5 kg)  06/29/21 221 lb 12.8 oz (100.6 kg)   Patient is in no distress; well developed, nourished and groomed; DECR ROM IN NECK  CARDIOVASCULAR: Examination of carotid arteries is normal; no carotid bruits Regular rate and rhythm, no murmurs Examination of peripheral vascular system by observation and palpation is normal  EYES: Ophthalmoscopic exam of optic discs and posterior segments is normal; no papilledema or hemorrhages No results found.  MUSCULOSKELETAL: Gait, strength, tone, movements noted in Neurologic exam below  NEUROLOGIC: MENTAL STATUS:      No data to display         awake, alert, oriented to person, place and time recent and remote memory intact normal attention and concentration language fluent, comprehension intact, naming intact fund of knowledge appropriate  CRANIAL NERVE:  2nd - no papilledema on fundoscopic exam 2nd, 3rd, 4th, 6th - pupils equal and reactive to light, visual fields full to confrontation, extraocular muscles intact, no nystagmus 5th - facial sensation symmetric 7th - facial strength symmetric 8th - hearing intact 9th - palate elevates symmetrically, uvula midline 11th - shoulder  shrug symmetric 12th - tongue protrusion midline  MOTOR:  normal bulk and tone, full strength in the BUE, BLE; LIMITED IN RIGHT ARM DUE TO SHOULDER PAIN  SENSORY:  normal and symmetric to light touch, temperature, vibration; DECR IN FEET / KNEES  COORDINATION:  finger-nose-finger, fine finger movements normal  REFLEXES:  deep tendon reflexes TRACE and symmetric  GAIT/STATION:  USING WALKER     DIAGNOSTIC DATA (LABS, IMAGING, TESTING) - I reviewed patient records, labs, notes, testing and imaging myself where available.  Lab Results  Component Value Date   WBC 4.9 09/08/2020   HGB 14.5 09/08/2020   HCT 44.0 09/08/2020   MCV 89.2 09/08/2020   PLT 152 09/08/2020      Component Value Date/Time   NA 141 04/15/2020 1415   K 4.8 04/15/2020 1415   CL 105 04/15/2020 1415   CO2 30 04/15/2020 1415   GLUCOSE 119 04/15/2020 1415   BUN 24 04/15/2020 1415   CREATININE 0.99 (H) 04/15/2020 1415   CALCIUM 8.8 04/15/2020 1415   PROT 6.2 04/15/2020 1415   PROT 6.1 10/29/2016 0943   ALBUMIN 4.0 09/02/2018 1151   ALBUMIN 4.1 10/29/2016 0943   AST 13 04/15/2020 1415   ALT 9 04/15/2020 1415   ALKPHOS 118 09/02/2018 1151   BILITOT 0.4 04/15/2020 1415   BILITOT 0.3 10/29/2016 0943   GFRNONAA >60 09/09/2018 0341   GFRNONAA 46 (L) 09/10/2016 0939   GFRAA >60 09/09/2018 0341   GFRAA 53 (L) 09/10/2016 0939   No results found for: "CHOL", "HDL", "LDLCALC", "LDLDIRECT", "TRIG", "CHOLHDL" No results found for: "HGBA1C" Lab Results  Component Value Date   VITAMINB12 538  10/01/2013   No results found for: "TSH"  06/08/20 CT head  - No acute or reversible finding. Ordinary age related volume loss. Mild chronic small-vessel ischemic changes of the cerebral hemispheric white matter. Atherosclerotic calcification of the major vessels at the base of the brain.  01/29/21 CT head 1. Mild cerebral atrophy and microvascular disease changes of the  supratentorial brain.  2. No acute  intracranial abnormality.   01/29/21 CT cervical spine 1. Status post cervical fusion from C4 through C7.  2. Moderate to marked severity degenerative changes at the levels of  C2-C3, C3-C4 and C7-T1.  3. No acute cervical spine fracture.    ASSESSMENT AND PLAN  81 y.o. year old female here with:   Dx:  1. Chronic right shoulder pain   2. Neck pain on right side     PLAN:  PAIN IN RIGHT SHOULDER, TRAPEZIUS, NECK - likely musculoskeletal strain; also with history of right shoulder replacement, rotator cuff injury; also with cervical degen spine dz (history of surgeries in 2001 and 2011) - recommend home PT/OT evaluation - follow up with pain mgmt clinic (has seen Dr. Gean Quint and Dr. Brien Few in the past)  TENSION HEADACHES - tylenol as needed  Return for return to PCP, pending if symptoms worsen or fail to improve.    Penni Bombard, MD 50/72/2575, 05:18 AM Certified in Neurology, Neurophysiology and Neuroimaging  [facility] [address] [place],  [address] [phone]

## 2021-12-29 ENCOUNTER — Other Ambulatory Visit: Payer: Self-pay

## 2021-12-29 ENCOUNTER — Observation Stay (HOSPITAL_COMMUNITY)
Admission: EM | Admit: 2021-12-29 | Discharge: 2021-12-30 | Disposition: A | Discharge status: Home Health | Payer: Medicare PPO | Attending: Family Medicine | Admitting: Family Medicine

## 2021-12-29 ENCOUNTER — Emergency Department (HOSPITAL_COMMUNITY): Payer: Medicare PPO

## 2021-12-29 ENCOUNTER — Encounter (HOSPITAL_COMMUNITY): Payer: Self-pay | Admitting: Emergency Medicine

## 2021-12-29 DIAGNOSIS — Z96611 Presence of right artificial shoulder joint: Secondary | ICD-10-CM | POA: Insufficient documentation

## 2021-12-29 DIAGNOSIS — R262 Difficulty in walking, not elsewhere classified: Secondary | ICD-10-CM | POA: Insufficient documentation

## 2021-12-29 DIAGNOSIS — R0602 Shortness of breath: Secondary | ICD-10-CM | POA: Diagnosis not present

## 2021-12-29 DIAGNOSIS — Z7951 Long term (current) use of inhaled steroids: Secondary | ICD-10-CM | POA: Diagnosis not present

## 2021-12-29 DIAGNOSIS — Z79899 Other long term (current) drug therapy: Secondary | ICD-10-CM | POA: Insufficient documentation

## 2021-12-29 DIAGNOSIS — E66812 Obesity, class 2: Secondary | ICD-10-CM

## 2021-12-29 DIAGNOSIS — J9601 Acute respiratory failure with hypoxia: Secondary | ICD-10-CM | POA: Diagnosis not present

## 2021-12-29 DIAGNOSIS — Z6838 Body mass index (BMI) 38.0-38.9, adult: Secondary | ICD-10-CM | POA: Diagnosis not present

## 2021-12-29 DIAGNOSIS — Z96653 Presence of artificial knee joint, bilateral: Secondary | ICD-10-CM | POA: Insufficient documentation

## 2021-12-29 DIAGNOSIS — J159 Unspecified bacterial pneumonia: Secondary | ICD-10-CM | POA: Diagnosis not present

## 2021-12-29 DIAGNOSIS — Z1152 Encounter for screening for COVID-19: Secondary | ICD-10-CM | POA: Diagnosis not present

## 2021-12-29 DIAGNOSIS — J189 Pneumonia, unspecified organism: Secondary | ICD-10-CM

## 2021-12-29 DIAGNOSIS — Z91040 Latex allergy status: Secondary | ICD-10-CM | POA: Insufficient documentation

## 2021-12-29 DIAGNOSIS — I10 Essential (primary) hypertension: Secondary | ICD-10-CM | POA: Diagnosis present

## 2021-12-29 DIAGNOSIS — F419 Anxiety disorder, unspecified: Secondary | ICD-10-CM | POA: Diagnosis present

## 2021-12-29 DIAGNOSIS — K219 Gastro-esophageal reflux disease without esophagitis: Secondary | ICD-10-CM | POA: Diagnosis present

## 2021-12-29 LAB — CBC WITH DIFFERENTIAL/PLATELET
Abs Immature Granulocytes: 0.08 10*3/uL — ABNORMAL HIGH (ref 0.00–0.07)
Basophils Absolute: 0 10*3/uL (ref 0.0–0.1)
Basophils Relative: 0 %
Eosinophils Absolute: 0 10*3/uL (ref 0.0–0.5)
Eosinophils Relative: 0 %
HCT: 38.6 % (ref 36.0–46.0)
Hemoglobin: 12.7 g/dL (ref 12.0–15.0)
Immature Granulocytes: 1 %
Lymphocytes Relative: 3 %
Lymphs Abs: 0.3 10*3/uL — ABNORMAL LOW (ref 0.7–4.0)
MCH: 29.9 pg (ref 26.0–34.0)
MCHC: 32.9 g/dL (ref 30.0–36.0)
MCV: 90.8 fL (ref 80.0–100.0)
Monocytes Absolute: 1.7 10*3/uL — ABNORMAL HIGH (ref 0.1–1.0)
Monocytes Relative: 15 %
Neutro Abs: 9.3 10*3/uL — ABNORMAL HIGH (ref 1.7–7.7)
Neutrophils Relative %: 81 %
Platelets: 92 10*3/uL — ABNORMAL LOW (ref 150–400)
RBC: 4.25 MIL/uL (ref 3.87–5.11)
RDW: 15.2 % (ref 11.5–15.5)
WBC: 11.5 10*3/uL — ABNORMAL HIGH (ref 4.0–10.5)
nRBC: 0 % (ref 0.0–0.2)

## 2021-12-29 LAB — COMPREHENSIVE METABOLIC PANEL
ALT: 13 U/L (ref 0–44)
AST: 19 U/L (ref 15–41)
Albumin: 3.5 g/dL (ref 3.5–5.0)
Alkaline Phosphatase: 50 U/L (ref 38–126)
Anion gap: 7 (ref 5–15)
BUN: 17 mg/dL (ref 8–23)
CO2: 25 mmol/L (ref 22–32)
Calcium: 8.2 mg/dL — ABNORMAL LOW (ref 8.9–10.3)
Chloride: 107 mmol/L (ref 98–111)
Creatinine, Ser: 0.88 mg/dL (ref 0.44–1.00)
GFR, Estimated: >60 mL/min (ref >60)
Glucose, Bld: 124 mg/dL — ABNORMAL HIGH (ref 70–99)
Potassium: 3.7 mmol/L (ref 3.5–5.1)
Sodium: 139 mmol/L (ref 135–145)
Total Bilirubin: 0.6 mg/dL (ref 0.3–1.2)
Total Protein: 6.1 g/dL — ABNORMAL LOW (ref 6.5–8.1)

## 2021-12-29 LAB — RESP PANEL BY RT-PCR (FLU A&B, COVID) ARPGX2
Influenza A by PCR: NEGATIVE
Influenza B by PCR: NEGATIVE
SARS Coronavirus 2 by RT PCR: NEGATIVE

## 2021-12-29 LAB — BRAIN NATRIURETIC PEPTIDE: B Natriuretic Peptide: 300 pg/mL — ABNORMAL HIGH (ref 0.0–100.0)

## 2021-12-29 LAB — LACTIC ACID, PLASMA
Lactic Acid, Venous: 0.9 mmol/L (ref 0.5–1.9)
Lactic Acid, Venous: 0.7 mmol/L (ref 0.5–1.9)

## 2021-12-29 MED ORDER — SERTRALINE HCL 50 MG PO TABS
100.0000 mg | ORAL_TABLET | Freq: Every day | ORAL | Status: DC
  Administered 2021-12-29 – 2021-12-30 (×2): 100 mg via ORAL
  Filled 2021-12-29 (×2): qty 2

## 2021-12-29 MED ORDER — HEPARIN SODIUM (PORCINE) 5000 UNIT/ML IJ SOLN
5000.0000 [IU] | Freq: Three times a day (TID) | INTRAMUSCULAR | Status: DC
  Administered 2021-12-29 – 2021-12-30 (×2): 5000 [IU] via SUBCUTANEOUS
  Filled 2021-12-29 (×2): qty 1

## 2021-12-29 MED ORDER — ACETAMINOPHEN 650 MG RE SUPP: 650.0000 mg | Freq: Four times a day (QID) | RECTAL | Status: DC | PRN

## 2021-12-29 MED ORDER — SODIUM CHLORIDE 0.9 % IV SOLN
2.0000 g | Freq: Once | INTRAVENOUS | Status: AC
  Administered 2021-12-29: 2 g via INTRAVENOUS
  Filled 2021-12-29: qty 20

## 2021-12-29 MED ORDER — GUAIFENESIN ER 600 MG PO TB12
1200.0000 mg | ORAL_TABLET | Freq: Two times a day (BID) | ORAL | Status: DC
  Administered 2021-12-29 – 2021-12-30 (×3): 1200 mg via ORAL
  Filled 2021-12-29 (×3): qty 2

## 2021-12-29 MED ORDER — ALBUTEROL SULFATE HFA 108 (90 BASE) MCG/ACT IN AERS: 2.0000 | INHALATION_SPRAY | RESPIRATORY_TRACT | Status: DC | PRN

## 2021-12-29 MED ORDER — SODIUM CHLORIDE 0.9 % IV SOLN
500.0000 mg | Freq: Once | INTRAVENOUS | Status: AC
  Administered 2021-12-29: 500 mg via INTRAVENOUS
  Filled 2021-12-29: qty 5

## 2021-12-29 MED ORDER — ALBUTEROL SULFATE (2.5 MG/3ML) 0.083% IN NEBU: 2.5000 mg | INHALATION_SOLUTION | RESPIRATORY_TRACT | Status: DC | PRN

## 2021-12-29 MED ORDER — PROCHLORPERAZINE EDISYLATE 10 MG/2ML IJ SOLN
10.0000 mg | Freq: Once | INTRAMUSCULAR | Status: AC
  Administered 2021-12-29: 10 mg via INTRAVENOUS
  Filled 2021-12-29: qty 2

## 2021-12-29 MED ORDER — FLUTICASONE PROPIONATE 50 MCG/ACT NA SUSP: 2.0000 | Freq: Every day | NASAL | Status: DC | PRN

## 2021-12-29 MED ORDER — SODIUM CHLORIDE 0.9 % IV SOLN: 2.0000 g | INTRAVENOUS | Status: DC

## 2021-12-29 MED ORDER — TRAZODONE HCL 50 MG PO TABS
50.0000 mg | ORAL_TABLET | Freq: Every day | ORAL | Status: DC
  Administered 2021-12-29: 50 mg via ORAL
  Filled 2021-12-29: qty 1

## 2021-12-29 MED ORDER — LISINOPRIL 10 MG PO TABS
10.0000 mg | ORAL_TABLET | Freq: Every morning | ORAL | Status: DC
  Administered 2021-12-29 – 2021-12-30 (×2): 10 mg via ORAL
  Filled 2021-12-29 (×2): qty 1

## 2021-12-29 MED ORDER — ACETAMINOPHEN 325 MG PO TABS
650.0000 mg | ORAL_TABLET | Freq: Four times a day (QID) | ORAL | Status: DC | PRN
  Administered 2021-12-29 (×2): 650 mg via ORAL
  Filled 2021-12-29 (×2): qty 2

## 2021-12-29 MED ORDER — PANTOPRAZOLE SODIUM 40 MG PO TBEC
40.0000 mg | DELAYED_RELEASE_TABLET | Freq: Every day | ORAL | Status: DC
  Administered 2021-12-29 – 2021-12-30 (×2): 40 mg via ORAL
  Filled 2021-12-29 (×2): qty 1

## 2021-12-29 MED ORDER — ONDANSETRON HCL 4 MG/2ML IJ SOLN
4.0000 mg | Freq: Once | INTRAMUSCULAR | Status: AC
  Administered 2021-12-29: 4 mg via INTRAVENOUS
  Filled 2021-12-29: qty 2

## 2021-12-29 MED ORDER — SODIUM CHLORIDE 0.9 % IV SOLN: 500.0000 mg | INTRAVENOUS | Status: DC

## 2021-12-29 NOTE — ED Triage Notes (Signed)
Pt via EMS from home c/o nausea and diarrhea x 3 days with SOB starting early this morning. Hx recurrent pneumonia presenting with similar symptoms. Pt reports fever at home this morning, afebrile on scene. No baseline oxygen requirement but pt arrives on 2L nasal cannula after 89% on room air on scene. Pt denies pain, a/o x 4, ambulatory.

## 2021-12-29 NOTE — H&P (Signed)
TR[facility] H&P   Patient Demographics:    [name] [name], is a 81 y.o. female  MRN: [id number]   DOB - [birth year]  Admit Date - 12/29/2021  Outpatient Primary MD for the patient is Burdine, Virgina Evener, MD  Referring MD/NP/PA: Dr. Roderic Palau  Patient coming from: home  Chief Complaint  Patient presents with   Shortness of Breath      HPI:    [name] [name]  is a 81 y.o. female, with past medical history of GERD, anxiety, depression, hypertension, she presents to ED secondary to complaints of shortness of breath, patient report generalized weakness, fatigue for the last 2 days, she does report progressive dyspnea, mainly exertional over last few days, as well she does report recent cough, productive with yellow phlegm, she denies fever, chills, but reports poor appetite. -In ED patient presents with 2 L nasal cannula, saturating 89%, upon my evaluation she dips to 86% on room air, her chest x-ray significant for up pneumonia, she had mild leukocytosis at 11.5, noted on IV Rocephin and azithromycin and [facility] hospitalist consulted to admit    Review of systems:   A full 10 point Review of Systems was done, except as stated above, all other Review of Systems were negative.   With Past History of the following :    Past Medical History:  Diagnosis Date   Anemia    Anxiety    Arthritis    Barrett's esophagus    seen on [facility] egd at [facility]   Depression    GERD (gastroesophageal reflux disease)    H/O hiatal hernia    Headache    Hypertension    Macular degeneration    Obesity    Pneumonia    PONV (postoperative nausea and vomiting)    Seasonal allergies    Sepsis (Thermal) 10/02/2013      Past Surgical History:  Procedure Laterality Date   ABDOMINAL HYSTERECTOMY     ABDOMINAL SURGERY     APPENDECTOMY     BACK SURGERY     lumbar x2 by Dr Arnoldo Morale   BIOPSY  09/03/2019   Procedure:  BIOPSY;  Surgeon: Rogene Houston, MD;  [facility];  Service: Endoscopy;;  esophagus   CARPAL TUNNEL RELEASE Right 03/2014   Dr. Lorin Mercy   CATARACT EXTRACTION W/PHACO Left 08/06/2013   Procedure: CATARACT EXTRACTION PHACO AND INTRAOCULAR LENS PLACEMENT LEFT EYE;  Surgeon: Tonny Branch, MD;  Location: [facility];  Service: Ophthalmology;  Laterality: Left;  CDE: 9.55   CATARACT EXTRACTION W/PHACO Right 08/24/2013   Procedure: CATARACT EXTRACTION PHACO AND INTRAOCULAR LENS PLACEMENT (IOC);  Surgeon: Tonny Branch, MD;  Location: [facility];  Service: Ophthalmology;  Laterality: Right;  CDE:8.30   CERVICAL FUSION     CHOLECYSTECTOMY     COLONOSCOPY N/A 11/03/2013   SLF: 1. Normal mucosa in the terminal ileum. 2. 13 colon polyps removed. 3.  Moderate diverticulosis in the descending colon and sigmoid colon 4. the left colon is redundant 5. Small internal  hemorrhoids.   COLONOSCOPY N/A 09/25/2017   Procedure: COLONOSCOPY;  Surgeon: Rogene Houston, MD;  [facility]: [facility];  Service: Endoscopy;  Laterality: N/A;  1:55   COLONOSCOPY WITH PROPOFOL N/A 04/29/2020   Castaneda: multiple polyps (tubular adenoma, sessile serrated, hyperplastic) ascending colon, transverse colon, descending colon, diverticulsos in sigmoid and descending colon, rectum/anal vergse normal   ESOPHAGOGASTRODUODENOSCOPY N/A 11/03/2013   SLF: 1. 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 Mild Non-erosive gastritis   ESOPHAGOGASTRODUODENOSCOPY  10/2014   [facility]: IMPRESSIONS: - likely small distal esophageal diverticulum without evidence of fistula, +barrett's esophagus without dysplasia. s/p gastric bypass   ESOPHAGOGASTRODUODENOSCOPY (EGD) WITH PROPOFOL N/A 09/03/2019   rehman: normal hypopharynx, esophagus, mid esophagus, diverticulum in distal esophagus, short segment barretts esophagus, gastric bypass with small sized pouch, gastrojejunal anastomosis with healthy appearing  mucosa, normal gastric body and jejunum   EYE SURGERY     GASTRIC BYPASS  1979   revision in [place] in 2011   JOINT REPLACEMENT Right 1995   hip   JOINT REPLACEMENT Left 2008   hip   MEDIAL PARTIAL KNEE REPLACEMENT Left    Dr Ronnie Derby   PARAESOPHAGEAL HERNIA REPAIR  SEP 2010 DR. MCNATT   POLYPECTOMY  09/25/2017   Procedure: POLYPECTOMY;  Surgeon: Rogene Houston, MD;  [facility];  Service: Endoscopy;;  colon   POLYPECTOMY  04/29/2020   Procedure: POLYPECTOMY;  Surgeon: Harvel Quale, MD;  [facility];  Service: Gastroenterology;;   SHOULDER ARTHROSCOPY Right    SHOULDER ARTHROSCOPY Right    x2   SKIN BIOPSY     TONSILLECTOMY     TOTAL KNEE ARTHROPLASTY Right 09/08/2018   Procedure: RIGHT TOTAL KNEE ARTHROPLASTY;  Surgeon: Marybelle Killings, MD;  Location: [place];  Service: Orthopedics;  Laterality: Right;   TOTAL SHOULDER ARTHROPLASTY Right 02/16/2013   Procedure: TOTAL SHOULDER ARTHROPLASTY;  Surgeon: Marybelle Killings, MD;  Location: [place];  Service: Orthopedics;  Laterality: Right;  Right Total Shoulder Arthroplasty, Cemented   WEIL OSTEOTOMY Right 07/18/2021   Procedure: RIGHT FOOT REVISION WEIL OSTEOTOMY 2ND AND 3RD METATARSAL, GASTROCNEMIUS RECESSION, PROXIMAL INTERPHALANGEAL RESECTION 2ND AND 3RD TOES;  Surgeon: Newt Minion, MD;  Location: [place];  Service: Orthopedics;  Laterality: Right;  regional with monitored anesthesia care      Social History:     Social History   Tobacco Use   Smoking status: Never   Smokeless tobacco: Never  Substance Use Topics   Alcohol use: No       Family History :     Family History  Problem Relation Age of Onset   Breast cancer Mother    Hypertension Father    Heart disease Father    Hypertension Sister    Colon cancer Brother        IN HIS 60s-METASTATIC   Hypertension Brother    Heart disease Brother    Colon polyps Paternal Grandfather        Home Medications:   Prior to Admission medications   Medication Sig Start Date End Date Taking? Authorizing Provider  acetaminophen (TYLENOL) 500 MG tablet Take 1,000 mg by mouth every 6 (six) hours as needed for moderate pain or headache.    [provider]  albuterol (PROVENTIL) (2.5 MG/3ML) 0.083% nebulizer solution Take 3 mLs (2.5 mg total) by nebulization every 4 (four) hours as needed for wheezing. Patient taking differently: Take 2.5 mg by nebulization every 6 (six) hours as needed for wheezing. 07/21/15   Kathie Dike, MD  alendronate (FOSAMAX) 70 MG tablet Take 70 mg by mouth every Tuesday.  01/26/19   [provider]  AZO-CRANBERRY PO Take 2 tablets by mouth daily as needed (kidney infections).     [provider]  Calcium-Phosphorus-Vitamin D (CITRACAL +D3 PO) Take 1 tablet by mouth daily.    [provider]  Cholecalciferol (VITAMIN D3) 50 MCG (2000 UT) capsule Take 2,000 Units by mouth daily.     [provider]  clobetasol (TEMOVATE) 0.05 % external solution SMARTSIG:6-8 Drop(s) Topical Every Night PRN 06/13/20   [provider]  conjugated estrogens (PREMARIN) vaginal cream Place 1 Applicatorful vaginally at bedtime. Use as directed at bedtime 03/08/20   Florian Buff, MD  dicyclomine (BENTYL) 10 MG capsule TAKE ONE CAPSULE BY MOUTH EVERY TWELVE HOURS AS NEEDED FOR SPASMS 09/07/20   Montez Morita, Quillian Quince, MD  docusate sodium (COLACE) 100 MG capsule Take 100 mg by mouth at bedtime as needed for mild constipation.     [provider]  doxycycline (VIBRA-TABS) 100 MG tablet Take 1 tablet (100 mg total) by mouth 2 (two) times daily. 07/26/21   Suzan Slick, NP  Ferrous Sulfate (SLOW FE PO) Take 1 tablet by mouth daily.    [provider]  fluticasone (FLONASE) 50 MCG/ACT nasal spray Place 2 sprays into both nostrils daily as needed for allergies. 07/07/19   [provider]   HYDROcodone-acetaminophen (NORCO/VICODIN) 5-325 MG tablet Take 1 tablet by mouth every 8 (eight) hours as needed for moderate pain. Patient not taking: Reported on 12/26/2021 08/30/21   Suzan Slick, NP  Lactobacillus [facility] ACIDOPHILUS) CAPS Take 1 capsule by mouth daily before breakfast.    [provider]  lisinopril (ZESTRIL) 10 MG tablet Take 10 mg by mouth every morning. 08/08/20   [provider]  MUCINEX 600 MG 12 hr tablet Take 600 mg by mouth 2 (two) times daily as needed for cough.  07/17/19   [provider]  naproxen (NAPROSYN) 500 MG tablet Take 500 mg by mouth as needed. 08/08/20   [provider]  Nystatin (GERHARDT'S BUTT CREAM) CREA Apply 1 application topically 3 (three) times daily as needed for irritation. 09/06/20   Florian Buff, MD  nystatin ointment (MYCOSTATIN) Apply 1 application topically 2 (two) times daily as needed (for irritation).    [provider]  omeprazole (PRILOSEC) 20 MG capsule Take 1 capsule (20 mg total) by mouth daily. 12/06/20   Carlan, Chelsea L, NP  ondansetron (ZOFRAN) 4 MG tablet Take 4 mg by mouth every 8 (eight) hours as needed for nausea or vomiting.    [provider]  sertraline (ZOLOFT) 100 MG tablet Take 100 mg by mouth daily.    [provider]  traZODone (DESYREL) 50 MG tablet Take 50 mg by mouth at bedtime.    [provider]     Allergies:     Allergies  Allergen Reactions   Gadavist [Gadobutrol] Itching and Swelling    Per patient    Novocain [Procaine Hcl] Other (See Comments)    Unknown-passed out with novocaine injected for dental surgery.   Other Hives and Other (See Comments)    EKG pads - need to  use pediatric pads   Latex Rash   Penicillins Rash and Other (See Comments)    Has patient had a PCN reaction causing immediate rash, facial/tongue/throat swelling, SOB or lightheadedness with hypotension: Yes Has patient had a PCN reaction causing severe rash  involving mucus membranes or skin necrosis: No Has patient had a PCN reaction that required hospitalization No Has patient had a PCN reaction occurring within the last 10 years: No  If all of the above answers are "NO", then may proceed with Cephalosporin use.    Sulfa Antibiotics Rash    hives     Physical Exam:   Vitals  Blood pressure (!) 151/64, pulse 94, temperature 99.8 F (37.7 C), temperature source Oral, resp. rate 19, height '5\' 3"'$  (1.6 m), weight 100.2 kg, SpO2 96 %.   1. General frail elderly female, laying in bed, no apparent distress  2. Normal affect and insight, Not Suicidal or Homicidal, Awake Alert, Oriented X 3.  3. No F.N deficits, ALL C.Nerves Intact, Strength 5/5 all 4 extremities, Sensation intact all 4 extremities, Plantars down going.  4. Ears and Eyes appear Normal, Conjunctivae clear, PERRLA. Moist Oral Mucosa.  5. Supple Neck, No JVD, No cervical lymphadenopathy appriciated, No Carotid Bruits.  6. Symmetrical Chest wall movement, Good air movement bilaterally, scattered Rales  7. RRR, No Gallops, Rubs or Murmurs, No Parasternal Heave.  8. Positive Bowel Sounds, Abdomen Soft, No tenderness, No organomegaly appriciated,No rebound -guarding or rigidity.  9.  No Cyanosis, Normal Skin Turgor, No Skin Rash or Bruise.  10. Good muscle tone,  joints appear normal , no effusions, Normal ROM.     Data Review:    CBC Recent Labs  Lab 12/29/21 1236  WBC 11.5*  HGB 12.7  HCT 38.6  PLT 92*  MCV 90.8  MCH 29.9  MCHC 32.9  RDW 15.2  LYMPHSABS 0.3*  MONOABS 1.7*  EOSABS 0.0  BASOSABS 0.0   ------------------------------------------------------------------------------------------------------------------  Chemistries  Recent Labs  Lab 12/29/21 1236  NA 139  K 3.7  CL 107  CO2 25  GLUCOSE 124*  BUN 17  CREATININE 0.88  CALCIUM 8.2*  AST 19  ALT 13  ALKPHOS 50  BILITOT 0.6    ------------------------------------------------------------------------------------------------------------------ estimated creatinine clearance is 57.6 mL/min (by C-G formula based on SCr of 0.88 mg/dL). ------------------------------------------------------------------------------------------------------------------ No results for input(s): "TSH", "T4TOTAL", "T3FREE", "THYROIDAB" in the last 72 hours.  Invalid input(s): "FREET3"  Coagulation profile No results for input(s): "INR", "PROTIME" in the last 168 hours. ------------------------------------------------------------------------------------------------------------------- No results for input(s): "DDIMER" in the last 72 hours. -------------------------------------------------------------------------------------------------------------------  Cardiac Enzymes No results for input(s): "CKMB", "TROPONINI", "MYOGLOBIN" in the last 168 hours.  Invalid input(s): "CK" ------------------------------------------------------------------------------------------------------------------    Component Value Date/Time   BNP 300.0 (H) 12/29/2021 1300     ---------------------------------------------------------------------------------------------------------------  Urinalysis    Component Value Date/Time   COLORURINE YELLOW 09/02/2018 1150   APPEARANCEUR CLEAR 09/02/2018 1150   LABSPEC 1.025 09/02/2018 1150   PHURINE 5.0 09/02/2018 1150   GLUCOSEU NEGATIVE 09/02/2018 1150   HGBUR NEGATIVE 09/02/2018 1150   BILIRUBINUR NEGATIVE 09/02/2018 1150   KETONESUR NEGATIVE 09/02/2018 1150   PROTEINUR 30 (A) 09/02/2018 1150   UROBILINOGEN 0.2 12/28/2013 1310   NITRITE NEGATIVE 09/02/2018 1150   LEUKOCYTESUR NEGATIVE 09/02/2018 1150    ----------------------------------------------------------------------------------------------------------------   Imaging Results:    DG Chest 2 View  Result Date: 12/29/2021 CLINICAL DATA:  Shortness  of breath EXAM: CHEST - 2 VIEW COMPARISON:  Radiograph 09/02/2018 FINDINGS: Unchanged cardiomediastinal silhouette.  There is airspace disease in the left mid and lower lung. Right lung is clear. No pleural effusion. No pneumothorax. No acute osseous abnormality. Right shoulder arthroplasty. Cervical spine fusion hardware. Left glenohumeral osteoarthritis. Thoracic spondylosis. IMPRESSION: Left mid and lower lung pneumonia. Electronically Signed   By: Maurine Simmering M.D.   On: 12/29/2021 12:51    My personal review of EKG: Rhythm Sinus Tachycardia, Rate  101 /min, QTc 439 , no Acute ST changes   Assessment & Plan:    Principal Problem:   Community acquired bacterial pneumonia Active Problems:   Primary hypertension   GERD (gastroesophageal reflux disease)   Anxiety   Class 2 severe obesity due to excess calories with serious comorbidity and body mass index (BMI) of 38.0 to 38.9 in adult [facility])   Acute hypoxic respiratory failure Community-acquired pneumonia -Presents with dyspnea, increased work of breathing, cough with productive phlegm, chest x-ray with evidence of pneumonia. -86% on room air, currently saturating 94% on 2 L nasal cannula -Milligrams of pneumonia pathway, follow-up blood cultures, sputum cultures, Legionella and strep pneumonia antigen -She was encouraged use incentive spirometry, flutter valve, started on Mucinex -Continue with IV Rocephin and azithromycin  Obesity -BMI of 39  Generalized weakness -Consult PT/OT  Hypertension  - with home medication   GERD -Continue with PPI  DVT Prophylaxis Heparin  AM Labs Ordered, also please review Full Orders  Family Communication: Admission, patients condition and plan of care including tests being ordered have been discussed with the patient  who indicate understanding and agree with the plan and Code Status.  Code Status Full  Likely DC to  home  Condition GUARDED    Consults called: none    Admission status:  observation    Time spent in minutes : 60 minutes   Phillips Climes M.D on 12/29/2021 at 3:26 PM   [facility] - Office  [phone]

## 2021-12-29 NOTE — ED Provider Notes (Signed)
[facility] EMERGENCY DEPARTMENT Provider Note   CSN: [id number] Arrival date & time: 12/29/21  1206     History {Add pertinent medical, surgical, social history, OB history to HPI:1} Chief Complaint  Patient presents with   Shortness of Breath    [name] [name] is a 81 y.o. female.  Patient has history of hypertension and emphysema.  She complains of cough shortness of breath weakness   Shortness of Breath      Home Medications Prior to Admission medications   Medication Sig Start Date End Date Taking? Authorizing Provider  acetaminophen (TYLENOL) 500 MG tablet Take 1,000 mg by mouth every 6 (six) hours as needed for moderate pain or headache.    [provider]  albuterol (PROVENTIL) (2.5 MG/3ML) 0.083% nebulizer solution Take 3 mLs (2.5 mg total) by nebulization every 4 (four) hours as needed for wheezing. Patient taking differently: Take 2.5 mg by nebulization every 6 (six) hours as needed for wheezing. 07/21/15   Kathie Dike, MD  alendronate (FOSAMAX) 70 MG tablet Take 70 mg by mouth every Tuesday.  01/26/19   [provider]  AZO-CRANBERRY PO Take 2 tablets by mouth daily as needed (kidney infections).     [provider]  Calcium-Phosphorus-Vitamin D (CITRACAL +D3 PO) Take 1 tablet by mouth daily.    [provider]  Cholecalciferol (VITAMIN D3) 50 MCG (2000 UT) capsule Take 2,000 Units by mouth daily.     [provider]  clobetasol (TEMOVATE) 0.05 % external solution SMARTSIG:6-8 Drop(s) Topical Every Night PRN 06/13/20   [provider]  conjugated estrogens (PREMARIN) vaginal cream Place 1 Applicatorful vaginally at bedtime. Use as directed at bedtime 03/08/20   Florian Buff, MD  dicyclomine (BENTYL) 10 MG capsule TAKE ONE CAPSULE BY MOUTH EVERY TWELVE HOURS AS NEEDED FOR SPASMS 09/07/20   Montez Morita, Quillian Quince, MD  docusate sodium (COLACE) 100 MG capsule Take 100 mg by mouth at bedtime as needed for mild  constipation.     [provider]  doxycycline (VIBRA-TABS) 100 MG tablet Take 1 tablet (100 mg total) by mouth 2 (two) times daily. 07/26/21   Suzan Slick, NP  Ferrous Sulfate (SLOW FE PO) Take 1 tablet by mouth daily.    [provider]  fluticasone (FLONASE) 50 MCG/ACT nasal spray Place 2 sprays into both nostrils daily as needed for allergies. 07/07/19   [provider]  HYDROcodone-acetaminophen (NORCO/VICODIN) 5-325 MG tablet Take 1 tablet by mouth every 8 (eight) hours as needed for moderate pain. Patient not taking: Reported on 12/26/2021 08/30/21   Suzan Slick, NP  Lactobacillus [facility] ACIDOPHILUS) CAPS Take 1 capsule by mouth daily before breakfast.    [provider]  lisinopril (ZESTRIL) 10 MG tablet Take 10 mg by mouth every morning. 08/08/20   [provider]  MUCINEX 600 MG 12 hr tablet Take 600 mg by mouth 2 (two) times daily as needed for cough.  07/17/19   [provider]  naproxen (NAPROSYN) 500 MG tablet Take 500 mg by mouth as needed. 08/08/20   [provider]  Nystatin (GERHARDT'S BUTT CREAM) CREA Apply 1 application topically 3 (three) times daily as needed for irritation. 09/06/20   Florian Buff, MD  nystatin ointment (MYCOSTATIN) Apply 1 application topically 2 (two) times daily as needed (for irritation).    [provider]  omeprazole (PRILOSEC) 20 MG capsule Take 1 capsule (20 mg total) by mouth daily. 12/06/20   Gabriel Rung, NP  ondansetron (ZOFRAN) 4 MG tablet Take 4 mg by mouth every 8 (eight) hours as needed for nausea or vomiting.    [provider]  sertraline (ZOLOFT) 100 MG tablet Take 100 mg by mouth daily.    [provider]  traZODone (DESYREL) 50 MG tablet Take 50 mg by mouth at bedtime.    [provider]      Allergies    Gadavist [gadobutrol], Novocain [procaine hcl], Other, Latex, Penicillins, and Sulfa antibiotics    Review of Systems   Review of  Systems  Respiratory:  Positive for shortness of breath.     Physical Exam Updated Vital Signs BP (!) 110/36   Pulse 87   Temp 99.8 F (37.7 C) (Oral)   Resp (!) 23   Ht '5\' 3"'$  (1.6 m)   Wt 100.2 kg   SpO2 96%   BMI 39.15 kg/m  Physical Exam  ED Results / Procedures / Treatments   Labs (all labs ordered are listed, but only abnormal results are displayed) Labs Reviewed  CBC WITH DIFFERENTIAL/PLATELET - Abnormal; Notable for the following components:      Result Value   WBC 11.5 (*)    Platelets 92 (*)    Neutro Abs 9.3 (*)    Lymphs Abs 0.3 (*)    Monocytes Absolute 1.7 (*)    Abs Immature Granulocytes 0.08 (*)    All other components within normal limits  COMPREHENSIVE METABOLIC PANEL - Abnormal; Notable for the following components:   Glucose, Bld 124 (*)    Calcium 8.2 (*)    Total Protein 6.1 (*)    All other components within normal limits  BRAIN NATRIURETIC PEPTIDE - Abnormal; Notable for the following components:   B Natriuretic Peptide 300.0 (*)    All other components within normal limits  RESP PANEL BY RT-PCR (FLU A&B, COVID) ARPGX2  CULTURE, BLOOD (ROUTINE X 2)  CULTURE, BLOOD (ROUTINE X 2)  LACTIC ACID, PLASMA  LACTIC ACID, PLASMA    EKG None  Radiology DG Chest 2 View  Result Date: 12/29/2021 CLINICAL DATA:  Shortness of breath EXAM: CHEST - 2 VIEW COMPARISON:  Radiograph 09/02/2018 FINDINGS: Unchanged cardiomediastinal silhouette. There is airspace disease in the left mid and lower lung. Right lung is clear. No pleural effusion. No pneumothorax. No acute osseous abnormality. Right shoulder arthroplasty. Cervical spine fusion hardware. Left glenohumeral osteoarthritis. Thoracic spondylosis. IMPRESSION: Left mid and lower lung pneumonia. Electronically Signed   By: Maurine Simmering M.D.   On: 12/29/2021 12:51    Procedures Procedures  {Document cardiac monitor, telemetry assessment procedure when appropriate:1}  Medications Ordered in ED Medications   albuterol (VENTOLIN HFA) 108 (90 Base) MCG/ACT inhaler 2 puff (has no administration in time range)  cefTRIAXone (ROCEPHIN) 2 g in sodium chloride 0.9 % 100 mL IVPB (has no administration in time range)  azithromycin (ZITHROMAX) 500 mg in sodium chloride 0.9 % 250 mL IVPB (has no administration in time range)  ondansetron (ZOFRAN) injection 4 mg (4 mg Intravenous Given 12/29/21 1257)    ED Course/ Medical Decision Making/ A&P                           Medical Decision Making Amount and/or Complexity of Data Reviewed Labs: ordered. Radiology: ordered.  Risk Prescription drug management. Decision regarding hospitalization.   Patient with a community-acquired pneumonia.  She will be admitted to the hospital started on IV antibiotics  {Document critical care time  when appropriate:1} {Document review of labs and clinical decision tools ie heart score, Chads2Vasc2 etc:1}  {Document your independent review of radiology images, and any outside records:1} {Document your discussion with family members, caretakers, and with consultants:1} {Document social determinants of health affecting pt's care:1} {Document your decision making why or why not admission, treatments were needed:1} Final Clinical Impression(s) / ED Diagnoses Final diagnoses:  Community acquired pneumonia, unspecified laterality    Rx / DC Orders ED Discharge Orders     None

## 2021-12-29 NOTE — ED Notes (Signed)
Patient transported to X-ray 

## 2021-12-30 DIAGNOSIS — K219 Gastro-esophageal reflux disease without esophagitis: Secondary | ICD-10-CM | POA: Diagnosis not present

## 2021-12-30 DIAGNOSIS — I10 Essential (primary) hypertension: Secondary | ICD-10-CM | POA: Diagnosis not present

## 2021-12-30 DIAGNOSIS — J159 Unspecified bacterial pneumonia: Secondary | ICD-10-CM | POA: Diagnosis not present

## 2021-12-30 LAB — CBC
HCT: 34.2 % — ABNORMAL LOW (ref 36.0–46.0)
Hemoglobin: 11.2 g/dL — ABNORMAL LOW (ref 12.0–15.0)
MCH: 29.9 pg (ref 26.0–34.0)
MCHC: 32.7 g/dL (ref 30.0–36.0)
MCV: 91.2 fL (ref 80.0–100.0)
Platelets: 84 10*3/uL — ABNORMAL LOW (ref 150–400)
RBC: 3.75 MIL/uL — ABNORMAL LOW (ref 3.87–5.11)
RDW: 14.9 % (ref 11.5–15.5)
WBC: 9.2 10*3/uL (ref 4.0–10.5)
nRBC: 0 % (ref 0.0–0.2)

## 2021-12-30 LAB — BASIC METABOLIC PANEL
Anion gap: 6 (ref 5–15)
BUN: 19 mg/dL (ref 8–23)
CO2: 26 mmol/L (ref 22–32)
Calcium: 8 mg/dL — ABNORMAL LOW (ref 8.9–10.3)
Chloride: 108 mmol/L (ref 98–111)
Creatinine, Ser: 0.86 mg/dL (ref 0.44–1.00)
GFR, Estimated: >60 mL/min (ref >60)
Glucose, Bld: 115 mg/dL — ABNORMAL HIGH (ref 70–99)
Potassium: 3.7 mmol/L (ref 3.5–5.1)
Sodium: 140 mmol/L (ref 135–145)

## 2021-12-30 LAB — STREP PNEUMONIAE URINARY ANTIGEN: Strep Pneumo Urinary Antigen: NEGATIVE

## 2021-12-30 MED ORDER — MUCINEX 600 MG PO TB12: 600.0000 mg | ORAL_TABLET | Freq: Two times a day (BID) | ORAL | 0 refills | Status: AC

## 2021-12-30 MED ORDER — DOXYCYCLINE HYCLATE 100 MG PO TABS: 100.0000 mg | ORAL_TABLET | Freq: Two times a day (BID) | ORAL | 0 refills | Status: AC

## 2021-12-30 NOTE — Discharge Instructions (Signed)
IMPORTANT INFORMATION: PAY CLOSE ATTENTION   PHYSICIAN DISCHARGE INSTRUCTIONS  Follow with Primary care provider  [place], Virgina Evener, MD  and other consultants as instructed by your Hospitalist Physician  [place] IF SYMPTOMS COME BACK, WORSEN OR NEW PROBLEM DEVELOPS   Please note: You were cared for by a hospitalist during your hospital stay. Every effort will be made to forward records to your primary care provider.  You can request that your primary care provider send for your hospital records if they have not received them.  Once you are discharged, your primary care physician will handle any further medical issues. Please note that NO REFILLS for any discharge medications will be authorized once you are discharged, as it is imperative that you return to your primary care physician (or establish a relationship with a primary care physician if you do not have one) for your post hospital discharge needs so that they can reassess your need for medications and monitor your lab values.  Please get a complete blood count and chemistry panel checked by your Primary MD at your next visit, and again as instructed by your Primary MD.  Get Medicines reviewed and adjusted: Please take all your medications with you for your next visit with your Primary MD  Laboratory/radiological data: Please request your Primary MD to go over all hospital tests and procedure/radiological results at the follow up, please ask your primary care provider to get all Hospital records sent to his/her office.  In some cases, they will be blood work, cultures and biopsy results pending at the time of your discharge. Please request that your primary care provider follow up on these results.  If you are diabetic, please bring your blood sugar readings with you to your follow up appointment with primary care.    Please call and make your follow up appointments as soon as possible.    Also  Note the following: If you experience worsening of your admission symptoms, develop shortness of breath, life threatening emergency, suicidal or homicidal thoughts you must seek medical attention immediately by calling 911 or calling your MD immediately  if symptoms less severe.  You must read complete instructions/literature along with all the possible adverse reactions/side effects for all the Medicines you take and that have been prescribed to you. Take any new Medicines after you have completely understood and accpet all the possible adverse reactions/side effects.   Do not drive when taking Pain medications or sleeping medications (Benzodiazepines)  Do not take more than prescribed Pain, Sleep and Anxiety Medications. It is not advisable to combine anxiety,sleep and pain medications without talking with your primary care practitioner  Special Instructions: If you have smoked or chewed Tobacco  in the last 2 yrs please stop smoking, stop any regular Alcohol  and or any Recreational drug use.  Wear Seat belts while driving.  Do not drive if taking any narcotic, mind altering or controlled substances or recreational drugs or alcohol.

## 2021-12-30 NOTE — Discharge Summary (Signed)
Physician Discharge Summary  [name] [name] KGY:[id number] DOB: [birth year] DOA: 12/29/2021  PCP: Curlene Labrum, MD  Admit date: 12/29/2021 Discharge date: 12/30/2021  Admitted From: Home  Disposition: Home   Recommendations for Outpatient Follow-up:  Follow up with PCP in 1-2 weeks  Home Health: PT   Discharge Condition: STABLE   CODE STATUS: FULL DIET: Resume prior home diet    Brief Hospitalization Summary: Please see all hospital notes, images, labs for full details of the hospitalization. ADMISSION HPI:  81 y.o. female, with past medical history of GERD, anxiety, depression, hypertension, she presents to ED secondary to complaints of shortness of breath, patient report generalized weakness, fatigue for the last 2 days, she does report progressive dyspnea, mainly exertional over last few days, as well she does report recent cough, productive with yellow phlegm, she denies fever, chills, but reports poor appetite. -In ED patient presents with 2 L nasal cannula, saturating 89%, upon my evaluation she dips to 86% on room air, her chest x-ray significant for up pneumonia, she had mild leukocytosis at 11.5, noted on IV Rocephin and azithromycin and [facility] hospital[facility] consulted to admit.    HOSPITAL COURSE Pt was admitted for mild community acquired pneumonia with mild hypoxia, started on IV antibiotics but quickly was weaned back to room air and feeling better.  Pt stable to return home and complete the rest of treatment outpatient.  Pt was evaluated by PT and did well.  Pt to discharge home is stable condition.  Outpatient follow up with PCP recommended in 1-2 weeks.   Discharge Diagnoses:  Principal Problem:   Community acquired bacterial pneumonia Active Problems:   Primary hypertension   GERD (gastroesophageal reflux disease)   Anxiety   Class 2 severe obesity due to excess calories with serious comorbidity and body mass index (BMI) of 38.0 to 38.9 in adult [facility])   Discharge  Instructions:  Allergies as of 12/30/2021       Reactions   Gadavist [gadobutrol] Itching, Swelling   Per patient    Novocain [procaine Hcl] Other (See Comments)   Unknown-passed out with novocaine injected for dental surgery.   Other Hives, Other (See Comments)   EKG pads - need to use pediatric pads   Latex Rash   Penicillins Rash, Other (See Comments)   Has patient had a PCN reaction causing immediate rash, facial/tongue/throat swelling, SOB or lightheadedness with hypotension: Yes Has patient had a PCN reaction causing severe rash involving mucus membranes or skin necrosis: No Has patient had a PCN reaction that required hospitalization No Has patient had a PCN reaction occurring within the last 10 years: No  If all of the above answers are "NO", then may proceed with Cephalosporin use.   Sulfa Antibiotics Rash   hives        Medication List     STOP taking these medications    HYDROcodone-acetaminophen 5-325 MG tablet Commonly known as: NORCO/VICODIN       TAKE these medications    acetaminophen 500 MG tablet Commonly known as: TYLENOL Take 500-1,000 mg by mouth every 6 (six) hours as needed for moderate pain or headache.   albuterol (2.5 MG/3ML) 0.083% nebulizer solution Commonly known as: PROVENTIL Take 3 mLs (2.5 mg total) by nebulization every 4 (four) hours as needed for wheezing. What changed: when to take this   alendronate 70 MG tablet Commonly known as: FOSAMAX Take 70 mg by mouth every Tuesday.   AZO-CRANBERRY PO Take 2 tablets by mouth daily  as needed (kidney infections).   CITRACAL +D3 PO Take 1 tablet by mouth daily.   docusate sodium 100 MG capsule Commonly known as: COLACE Take 100 mg by mouth at bedtime as needed for mild constipation.   doxycycline 100 MG tablet Commonly known as: VIBRA-TABS Take 1 tablet (100 mg total) by mouth 2 (two) times daily for 5 days.   Florajen Acidophilus Caps Take 1 capsule by mouth daily before  breakfast.   fluticasone 50 MCG/ACT nasal spray Commonly known as: FLONASE Place 2 sprays into both nostrils daily as needed for allergies.   Gerhardt's butt cream Crea Apply 1 application topically 3 (three) times daily as needed for irritation.   lisinopril 10 MG tablet Commonly known as: ZESTRIL Take 10 mg by mouth every morning.   Mucinex 600 MG 12 hr tablet Generic drug: guaiFENesin Take 1 tablet (600 mg total) by mouth 2 (two) times daily for 3 days. What changed:  when to take this reasons to take this   nystatin ointment Commonly known as: MYCOSTATIN Apply 1 application topically 2 (two) times daily as needed (for irritation).   omeprazole 20 MG capsule Commonly known as: PRILOSEC Take 1 capsule (20 mg total) by mouth daily.   ondansetron 4 MG tablet Commonly known as: ZOFRAN Take 4 mg by mouth every 8 (eight) hours as needed for nausea or vomiting.   Premarin vaginal cream Generic drug: conjugated estrogens Place 1 Applicatorful vaginally at bedtime. Use as directed at bedtime   sertraline 100 MG tablet Commonly known as: ZOLOFT Take 100 mg by mouth daily.   SLOW FE PO Take 1 tablet by mouth daily.   traZODone 50 MG tablet Commonly known as: DESYREL Take 50 mg by mouth at bedtime.   Vitamin D3 50 MCG (2000 UT) capsule Take 2,000 Units by mouth daily.        Follow-up Information     Burdine, Virgina Evener, MD Follow up in 1 week(s).   Specialty: Family Medicine Why: Hospital Follow Up Contact information: [place] [address] [phone]                Allergies  Allergen Reactions   Gadavist [Gadobutrol] Itching and Swelling    Per patient    Novocain [Procaine Hcl] Other (See Comments)    Unknown-passed out with novocaine injected for dental surgery.   Other Hives and Other (See Comments)    EKG pads - need to use pediatric pads   Latex Rash   Penicillins Rash and Other (See Comments)    Has patient had a PCN reaction  causing immediate rash, facial/tongue/throat swelling, SOB or lightheadedness with hypotension: Yes Has patient had a PCN reaction causing severe rash involving mucus membranes or skin necrosis: No Has patient had a PCN reaction that required hospitalization No Has patient had a PCN reaction occurring within the last 10 years: No  If all of the above answers are "NO", then may proceed with Cephalosporin use.    Sulfa Antibiotics Rash    hives   Allergies as of 12/30/2021       Reactions   Gadavist [gadobutrol] Itching, Swelling   Per patient    Novocain [procaine Hcl] Other (See Comments)   Unknown-passed out with novocaine injected for dental surgery.   Other Hives, Other (See Comments)   EKG pads - need to use pediatric pads   Latex Rash   Penicillins Rash, Other (See Comments)   Has patient had a PCN reaction  causing immediate rash, facial/tongue/throat swelling, SOB or lightheadedness with hypotension: Yes Has patient had a PCN reaction causing severe rash involving mucus membranes or skin necrosis: No Has patient had a PCN reaction that required hospitalization No Has patient had a PCN reaction occurring within the last 10 years: No  If all of the above answers are "NO", then may proceed with Cephalosporin use.   Sulfa Antibiotics Rash   hives        Medication List     STOP taking these medications    HYDROcodone-acetaminophen 5-325 MG tablet Commonly known as: NORCO/VICODIN       TAKE these medications    acetaminophen 500 MG tablet Commonly known as: TYLENOL Take 500-1,000 mg by mouth every 6 (six) hours as needed for moderate pain or headache.   albuterol (2.5 MG/3ML) 0.083% nebulizer solution Commonly known as: PROVENTIL Take 3 mLs (2.5 mg total) by nebulization every 4 (four) hours as needed for wheezing. What changed: when to take this   alendronate 70 MG tablet Commonly known as: FOSAMAX Take 70 mg by mouth every Tuesday.   AZO-CRANBERRY PO Take  2 tablets by mouth daily as needed (kidney infections).   CITRACAL +D3 PO Take 1 tablet by mouth daily.   docusate sodium 100 MG capsule Commonly known as: COLACE Take 100 mg by mouth at bedtime as needed for mild constipation.   doxycycline 100 MG tablet Commonly known as: VIBRA-TABS Take 1 tablet (100 mg total) by mouth 2 (two) times daily for 5 days.   Florajen Acidophilus Caps Take 1 capsule by mouth daily before breakfast.   fluticasone 50 MCG/ACT nasal spray Commonly known as: FLONASE Place 2 sprays into both nostrils daily as needed for allergies.   Gerhardt's butt cream Crea Apply 1 application topically 3 (three) times daily as needed for irritation.   lisinopril 10 MG tablet Commonly known as: ZESTRIL Take 10 mg by mouth every morning.   Mucinex 600 MG 12 hr tablet Generic drug: guaiFENesin Take 1 tablet (600 mg total) by mouth 2 (two) times daily for 3 days. What changed:  when to take this reasons to take this   nystatin ointment Commonly known as: MYCOSTATIN Apply 1 application topically 2 (two) times daily as needed (for irritation).   omeprazole 20 MG capsule Commonly known as: PRILOSEC Take 1 capsule (20 mg total) by mouth daily.   ondansetron 4 MG tablet Commonly known as: ZOFRAN Take 4 mg by mouth every 8 (eight) hours as needed for nausea or vomiting.   Premarin vaginal cream Generic drug: conjugated estrogens Place 1 Applicatorful vaginally at bedtime. Use as directed at bedtime   sertraline 100 MG tablet Commonly known as: ZOLOFT Take 100 mg by mouth daily.   SLOW FE PO Take 1 tablet by mouth daily.   traZODone 50 MG tablet Commonly known as: DESYREL Take 50 mg by mouth at bedtime.   Vitamin D3 50 MCG (2000 UT) capsule Take 2,000 Units by mouth daily.        Procedures/Studies: DG Chest 2 View  Result Date: 12/29/2021 CLINICAL DATA:  Shortness of breath EXAM: CHEST - 2 VIEW COMPARISON:  Radiograph 09/02/2018 FINDINGS:  Unchanged cardiomediastinal silhouette. There is airspace disease in the left mid and lower lung. Right lung is clear. No pleural effusion. No pneumothorax. No acute osseous abnormality. Right shoulder arthroplasty. Cervical spine fusion hardware. Left glenohumeral osteoarthritis. Thoracic spondylosis. IMPRESSION: Left mid and lower lung pneumonia. Electronically Signed   By: Ileene Patrick.D.  On: 12/29/2021 12:51     Subjective: Pt weaned off oxygen to room air, breathing well, ambulated with PT, wanting to go home.  No SOB or chest pain.    Discharge Exam: Vitals:   12/30/21 0459 12/30/21 0736  BP: (!) 123/49 (!) 144/64  Pulse: 67 64  Resp: 16 14  Temp: 98.6 F (37 C) 97.9 F (36.6 C)  SpO2: 94% 95%   Vitals:   12/29/21 2010 12/30/21 0056 12/30/21 0459 12/30/21 0736  BP: 137/66 126/60 (!) 123/49 (!) 144/64  Pulse: 78 71 67 64  Resp: '19 16 16 14  '$ Temp: 98.6 F (37 C) 98.2 F (36.8 C) 98.6 F (37 C) 97.9 F (36.6 C)  TempSrc:    Oral  SpO2: 97% 94% 94% 95%  Weight:      Height:       General: Pt is alert, awake, not in acute distress Cardiovascular: normal S1/S2 +, no rubs, no gallops Respiratory: no increased work of breathing  Abdominal: Soft, NT, ND, bowel sounds + Extremities: no edema, no cyanosis   The results of significant diagnostics from this hospitalization (including imaging, microbiology, ancillary and laboratory) are listed below for reference.     Microbiology: Recent Results (from the past 240 hour(s))  Resp Panel by RT-PCR (Flu A&B, Covid) Anterior Nasal Swab     Status: None   Collection Time: 12/29/21 12:15 PM   Specimen: Anterior Nasal Swab  Result Value Ref Range Status   SARS Coronavirus 2 by RT PCR NEGATIVE NEGATIVE Final    Comment: (NOTE) SARS-CoV-2 target nucleic acids are NOT DETECTED.  The SARS-CoV-2 RNA is generally detectable in upper respiratory specimens during the acute phase of infection. The lowest concentration of SARS-CoV-2  viral copies this assay can detect is 138 copies/mL. A negative result does not preclude SARS-Cov-2 infection and should not be used as the sole basis for treatment or other patient management decisions. A negative result may occur with  improper specimen collection/handling, submission of specimen other than nasopharyngeal swab, presence of viral mutation(s) within the areas targeted by this assay, and inadequate number of viral copies(<138 copies/mL). A negative result must be combined with clinical observations, patient history, and epidemiological information. The expected result is Negative.  Fact Sheet for Patients:  EntrepreneurPulse.com.au  Fact Sheet for Healthcare Providers:  IncredibleEmployment.be  This test is no t yet approved or cleared by the [place] FDA and  has been authorized for detection and/or diagnosis of SARS-CoV-2 by FDA under an Emergency Use Authorization (EUA). This EUA will remain  in effect (meaning this test can be used) for the duration of the COVID-19 declaration under Section 564(b)(1) of the Act, 21 U.S.C.section 360bbb-3(b)(1), unless the authorization is terminated  or revoked sooner.       Influenza A by PCR NEGATIVE NEGATIVE Final   Influenza B by PCR NEGATIVE NEGATIVE Final    Comment: (NOTE) The Xpert Xpress SARS-CoV-2/FLU/RSV plus assay is intended as an aid in the diagnosis of influenza from Nasopharyngeal swab specimens and should not be used as a sole basis for treatment. Nasal washings and aspirates are unacceptable for Xpert Xpress SARS-CoV-2/FLU/RSV testing.  Fact Sheet for Patients: EntrepreneurPulse.com.au  Fact Sheet for Healthcare Providers: IncredibleEmployment.be  This test is not yet approved or cleared by the [place] FDA and has been authorized for detection and/or diagnosis of SARS-CoV-2 by FDA under an Emergency Use Authorization  (EUA). This EUA will remain in effect (meaning this test can be used) for  the duration of the COVID-19 declaration under Section 564(b)(1) of the Act, 21 U.S.C. section 360bbb-3(b)(1), unless the authorization is terminated or revoked.  Performed at [facility], [address] [place], [place] [address]   Blood culture (routine x 2)     Status: None (Preliminary result)   Collection Time: 12/29/21  1:54 PM   Specimen: Right Antecubital; Blood  Result Value Ref Range Status   Specimen Description   Final    RIGHT ANTECUBITAL BOTTLES DRAWN AEROBIC AND ANAEROBIC   Special Requests   Final    Blood Culture results may not be optimal due to an excessive volume of blood received in culture bottles   Culture   Final    NO GROWTH < 12 HOURS Performed at [facility], [address], [place], [place] [address]    Report Status PENDING  Incomplete  Blood culture (routine x 2)     Status: None (Preliminary result)   Collection Time: 12/29/21  2:00 PM   Specimen: Left Antecubital; Blood  Result Value Ref Range Status   Specimen Description   Final    LEFT ANTECUBITAL BOTTLES DRAWN AEROBIC AND ANAEROBIC   Special Requests   Final    Blood Culture results may not be optimal due to an excessive volume of blood received in culture bottles   Culture   Final    NO GROWTH < 12 HOURS Performed at [facility], [address], [place], [place] [address]    Report Status PENDING  Incomplete     Labs: BNP (last 3 results) Recent Labs    12/29/21 1300  BNP 233.0*   Basic Metabolic Panel: Recent Labs  Lab 12/29/21 1236 12/30/21 0429  NA 139 140  K 3.7 3.7  CL 107 108  CO2 25 26  GLUCOSE 124* 115*  BUN 17 19  CREATININE 0.88 0.86  CALCIUM 8.2* 8.0*   Liver Function Tests: Recent Labs  Lab 12/29/21 1236  AST 19  ALT 13  ALKPHOS 50  BILITOT 0.6  PROT 6.1*  ALBUMIN 3.5   No results for input(s): "LIPASE", "AMYLASE" in the last 168 hours. No results for input(s): "AMMONIA" in  the last 168 hours. CBC: Recent Labs  Lab 12/29/21 1236 12/30/21 0429  WBC 11.5* 9.2  NEUTROABS 9.3*  --   HGB 12.7 11.2*  HCT 38.6 34.2*  MCV 90.8 91.2  PLT 92* 84*   Cardiac Enzymes: No results for input(s): "CKTOTAL", "CKMB", "CKMBINDEX", "TROPONINI" in the last 168 hours. BNP: Invalid input(s): "POCBNP" CBG: No results for input(s): "GLUCAP" in the last 168 hours. D-Dimer No results for input(s): "DDIMER" in the last 72 hours. Hgb A1c No results for input(s): "HGBA1C" in the last 72 hours. Lipid Profile No results for input(s): "CHOL", "HDL", "LDLCALC", "TRIG", "CHOLHDL", "LDLDIRECT" in the last 72 hours. Thyroid function studies No results for input(s): "TSH", "T4TOTAL", "T3FREE", "THYROIDAB" in the last 72 hours.  Invalid input(s): "FREET3" Anemia work up No results for input(s): "VITAMINB12", "FOLATE", "FERRITIN", "TIBC", "IRON", "RETICCTPCT" in the last 72 hours. Urinalysis    Component Value Date/Time   COLORURINE YELLOW 09/02/2018 1150   APPEARANCEUR CLEAR 09/02/2018 1150   LABSPEC 1.025 09/02/2018 1150   PHURINE 5.0 09/02/2018 1150   GLUCOSEU NEGATIVE 09/02/2018 1150   HGBUR NEGATIVE 09/02/2018 [place] 09/02/2018 1150   KETONESUR NEGATIVE 09/02/2018 1150   PROTEINUR 30 (A) 09/02/2018 1150   UROBILINOGEN 0.2 12/28/2013 1310   NITRITE NEGATIVE 09/02/2018 1150   LEUKOCYTESUR NEGATIVE  09/02/2018 1150   Sepsis Labs Recent Labs  Lab 12/29/21 1236 12/30/21 0429  WBC 11.5* 9.2   Microbiology Recent Results (from the past 240 hour(s))  Resp Panel by RT-PCR (Flu A&B, Covid) Anterior Nasal Swab     Status: None   Collection Time: 12/29/21 12:15 PM   Specimen: Anterior Nasal Swab  Result Value Ref Range Status   SARS Coronavirus 2 by RT PCR NEGATIVE NEGATIVE Final    Comment: (NOTE) SARS-CoV-2 target nucleic acids are NOT DETECTED.  The SARS-CoV-2 RNA is generally detectable in upper respiratory specimens during the acute phase of  infection. The lowest concentration of SARS-CoV-2 viral copies this assay can detect is 138 copies/mL. A negative result does not preclude SARS-Cov-2 infection and should not be used as the sole basis for treatment or other patient management decisions. A negative result may occur with  improper specimen collection/handling, submission of specimen other than nasopharyngeal swab, presence of viral mutation(s) within the areas targeted by this assay, and inadequate number of viral copies(<138 copies/mL). A negative result must be combined with clinical observations, patient history, and epidemiological information. The expected result is Negative.  Fact Sheet for Patients:  EntrepreneurPulse.com.au  Fact Sheet for Healthcare Providers:  IncredibleEmployment.be  This test is no t yet approved or cleared by the [place] FDA and  has been authorized for detection and/or diagnosis of SARS-CoV-2 by FDA under an Emergency Use Authorization (EUA). This EUA will remain  in effect (meaning this test can be used) for the duration of the COVID-19 declaration under Section 564(b)(1) of the Act, 21 U.S.C.section 360bbb-3(b)(1), unless the authorization is terminated  or revoked sooner.       Influenza A by PCR NEGATIVE NEGATIVE Final   Influenza B by PCR NEGATIVE NEGATIVE Final    Comment: (NOTE) The Xpert Xpress SARS-CoV-2/FLU/RSV plus assay is intended as an aid in the diagnosis of influenza from Nasopharyngeal swab specimens and should not be used as a sole basis for treatment. Nasal washings and aspirates are unacceptable for Xpert Xpress SARS-CoV-2/FLU/RSV testing.  Fact Sheet for Patients: EntrepreneurPulse.com.au  Fact Sheet for Healthcare Providers: IncredibleEmployment.be  This test is not yet approved or cleared by the [place] FDA and has been authorized for detection and/or diagnosis of SARS-CoV-2  by FDA under an Emergency Use Authorization (EUA). This EUA will remain in effect (meaning this test can be used) for the duration of the COVID-19 declaration under Section 564(b)(1) of the Act, 21 U.S.C. section 360bbb-3(b)(1), unless the authorization is terminated or revoked.  Performed at [facility], [address] [place], [place] [address]   Blood culture (routine x 2)     Status: None (Preliminary result)   Collection Time: 12/29/21  1:54 PM   Specimen: Right Antecubital; Blood  Result Value Ref Range Status   Specimen Description   Final    RIGHT ANTECUBITAL BOTTLES DRAWN AEROBIC AND ANAEROBIC   Special Requests   Final    Blood Culture results may not be optimal due to an excessive volume of blood received in culture bottles   Culture   Final    NO GROWTH < 12 HOURS Performed at [facility], [address], [place], [place] [address]    Report Status PENDING  Incomplete  Blood culture (routine x 2)     Status: None (Preliminary result)   Collection Time: 12/29/21  2:00 PM   Specimen: Left Antecubital; Blood  Result Value Ref Range Status   Specimen Description  Final    LEFT ANTECUBITAL BOTTLES DRAWN AEROBIC AND ANAEROBIC   Special Requests   Final    Blood Culture results may not be optimal due to an excessive volume of blood received in culture bottles   Culture   Final    NO GROWTH < 12 HOURS Performed at [facility], [address], [place],  [address]    Report Status PENDING  Incomplete    Time coordinating discharge:   SIGNED:  Irwin Brakeman, MD  [facility] 12/30/2021, 11:04 AM How to contact the [facility] Attending or Consulting provider [place] or covering provider during after hours [place], for this patient?  Check the care team in [facility] and look for a) attending/consulting [facility] provider listed and b) the [facility] team listed Log into [email or web address] and use 's universal password to access. If you do not have the password, please  contact the hospital operator. Locate the [facility] provider you are looking for under [facility] and page to a number that you can be directly reached. If you still have difficulty reaching the provider, please page the [facility] (Director on Call) for the Hospitalists listed on amion for assistance.

## 2021-12-30 NOTE — Progress Notes (Signed)
Patient discharged home today, transported home by family. Discharge paperwork went over with patient, patient verbalized understanding. Belongings sent home with patient.  ?

## 2021-12-30 NOTE — Progress Notes (Signed)
Spoke with patient concerning legal guardian , Enis Gash her friend.  She said Inez Catalina is not her legal guardian, but just her POA.

## 2021-12-30 NOTE — Evaluation (Signed)
Physical Therapy Evaluation Patient Details Name: [name] [name] MRN: [id number] DOB: 08-Jun-1940 Today's Date: 12/30/2021  History of Present Illness  [name] [name]  is a 81 y.o. female, with past medical history of GERD, anxiety, depression, hypertension, she presents to ED secondary to complaints of shortness of breath, patient report generalized weakness, fatigue for the last 2 days, she does report progressive dyspnea, mainly exertional over last few days, as well she does report recent cough, productive with yellow phlegm, she denies fever, chills, but reports poor appetite.  -In ED patient presents with 2 L nasal cannula, saturating 89%, upon my evaluation she dips to 86% on room air, her chest x-ray significant for up pneumonia, she had mild leukocytosis at 11.5, noted on IV Rocephin and azithromycin and [facility] hospitalist consulted to admit.   Clinical Impression  Patient supine in bed upon therapist arrival and agreeable to participation in PT evaluation today.  Patient performed all mobility activities modified independent with supervision assistance for safety due to unfamiliar environment. Patient was able to stand without assistance or assistive device and don/doff her robe without loss of balance. Patient was able to ambulation 250 feet using RW with mild indications of shortness of breathe. Patient evaluated by Physical Therapy with no further acute PT needs identified. All education has been completed and the patient has no further questions. See below for any follow-up Physical Therapy or equipment needs. PT is signing off. Thank you for this referral.      Recommendations for follow up therapy are one component of a multi-disciplinary discharge planning process, led by the attending physician.  Recommendations may be updated based on patient status, additional functional criteria and insurance authorization.  Follow Up Recommendations No PT follow up      Assistance Recommended at  Discharge PRN  Patient can return home with the following  Assist for transportation;Help with stairs or ramp for entrance;A little help with bathing/dressing/bathroom    Equipment Recommendations None recommended by PT  Recommendations for Other Services       Functional Status Assessment Patient has had a recent decline in their functional status and demonstrates the ability to make significant improvements in function in a reasonable and predictable amount of time.     Precautions / Restrictions Precautions Precautions: Fall Restrictions Weight Bearing Restrictions: No      Mobility  Bed Mobility Overal bed mobility: Modified Independent             General bed mobility comments: extra time    Transfers Overall transfer level: Modified independent Equipment used: Rolling walker (2 wheels), None               General transfer comment: performed sit to stand and stood at edge of bed donning and doffing robe without use of Rollator and was steady    Ambulation/Gait Ambulation/Gait assistance: Supervision, Modified independent (Device/Increase time) Gait Distance (Feet): 250 Feet Assistive device: Rolling walker (2 wheels) Gait Pattern/deviations: Step-through pattern, Decreased step length - right, Decreased step length - left, Decreased stride length Gait velocity: decreased     General Gait Details: somewhat slow cadence using RW but able to converse throughout with mild demonstration of shortness of breath; limited by fatigue; on room air throughout session; supervision assist for safety due to unfamiliar environment.  Stairs            Wheelchair Mobility    Modified Rankin (Stroke Patients Only)       Balance Overall balance assessment: Modified Independent  Pertinent Vitals/Pain Pain Assessment Pain Assessment: No/denies pain    Home Living Family/patient expects to be discharged to:: Private residence Living Arrangements:  Alone Available Help at Discharge: Friend(s);Available PRN/intermittently Type of Home: House Home Access: Ramped entrance       Home Layout: One level Home Equipment: Standard Walker;Rollator (4 wheels);Wheelchair - manual;Tub bench;BSC/3in1      Prior Function Prior Level of Function : Needs assist       Physical Assist : ADLs (physical)   ADLs (physical): IADLs Mobility Comments: independent with Rollator prior to admission ADLs Comments: friend assist with groceries and laundry; patient cooks and Development worker, international aid [employer]        Extremity/Trunk Assessment   Upper Extremity Assessment Upper Extremity Assessment: Overall WFL for tasks assessed    Lower Extremity Assessment Lower Extremity Assessment: Overall WFL for tasks assessed    Cervical / Trunk Assessment Cervical / Trunk Assessment: Kyphotic  Communication   Communication: No difficulties  Cognition Arousal/Alertness: Awake/alert Behavior During Therapy: WFL for tasks assessed/performed Overall Cognitive Status: Within Functional Limits for tasks assessed          General Comments      Exercises     Assessment/Plan    PT Assessment Patient does not need any further PT services  PT Problem List         PT Treatment Interventions      PT Goals (Current goals can be found in the Care Plan section)       Frequency          AM-PAC PT "6 Clicks" Mobility  Outcome Measure Help needed turning from your back to your side while in a flat bed without using bedrails?: None Help needed moving from lying on your back to sitting on the side of a flat bed without using bedrails?: None Help needed moving to and from a bed to a chair (including a wheelchair)?: None Help needed standing up from a chair using your arms (e.g., wheelchair or bedside chair)?: None Help needed to walk in hospital room?: A Little Help needed climbing 3-5 steps with a railing? : A Little 6 Click Score: 22    End of  Session   Activity Tolerance: Patient tolerated treatment well;Patient limited by fatigue Patient left: in bed;with bed alarm set;with call bell/phone within reach Nurse Communication: Mobility status PT Visit Diagnosis: Difficulty in walking, not elsewhere classified (R26.2)    Time: 4174-0814 PT Time Calculation (min) (ACUTE ONLY): 30 min   Charges:   PT Evaluation $PT Eval Low Complexity: 1 Low PT Treatments $Therapeutic Activity: 8-22 mins       Floria Raveling. Hartnett-Rands, MS, PT [facility] [place] [phone]  Pamala Hurry  Hartnett-Rands 12/30/2021, 10:56 AM

## 2022-01-01 ENCOUNTER — Ambulatory Visit (INDEPENDENT_AMBULATORY_CARE_PROVIDER_SITE_OTHER): Payer: Medicare PPO | Admitting: Gastroenterology

## 2022-01-01 LAB — LEGIONELLA PNEUMOPHILA SEROGP 1 UR AG: L. pneumophila Serogp 1 Ur Ag: NEGATIVE

## 2022-01-03 LAB — CULTURE, BLOOD (ROUTINE X 2)
Culture: NO GROWTH
Culture: NO GROWTH

## 2022-01-16 DIAGNOSIS — G8929 Other chronic pain: Secondary | ICD-10-CM | POA: Diagnosis not present

## 2022-01-16 DIAGNOSIS — M47816 Spondylosis without myelopathy or radiculopathy, lumbar region: Secondary | ICD-10-CM | POA: Diagnosis not present

## 2022-01-16 DIAGNOSIS — M542 Cervicalgia: Secondary | ICD-10-CM | POA: Diagnosis not present

## 2022-01-16 DIAGNOSIS — M25511 Pain in right shoulder: Secondary | ICD-10-CM | POA: Diagnosis not present

## 2022-01-29 DIAGNOSIS — J189 Pneumonia, unspecified organism: Secondary | ICD-10-CM | POA: Diagnosis not present

## 2022-01-29 DIAGNOSIS — M5136 Other intervertebral disc degeneration, lumbar region: Secondary | ICD-10-CM | POA: Diagnosis not present

## 2022-01-29 DIAGNOSIS — Z981 Arthrodesis status: Secondary | ICD-10-CM | POA: Diagnosis not present

## 2022-01-29 DIAGNOSIS — M159 Polyosteoarthritis, unspecified: Secondary | ICD-10-CM | POA: Diagnosis not present

## 2022-02-01 DIAGNOSIS — M47812 Spondylosis without myelopathy or radiculopathy, cervical region: Secondary | ICD-10-CM | POA: Diagnosis not present

## 2022-02-06 DIAGNOSIS — I10 Essential (primary) hypertension: Secondary | ICD-10-CM | POA: Diagnosis not present

## 2022-02-06 DIAGNOSIS — N189 Chronic kidney disease, unspecified: Secondary | ICD-10-CM | POA: Diagnosis not present

## 2022-02-06 DIAGNOSIS — I5032 Chronic diastolic (congestive) heart failure: Secondary | ICD-10-CM | POA: Diagnosis not present

## 2022-02-06 DIAGNOSIS — Z1329 Encounter for screening for other suspected endocrine disorder: Secondary | ICD-10-CM | POA: Diagnosis not present

## 2022-02-06 DIAGNOSIS — E559 Vitamin D deficiency, unspecified: Secondary | ICD-10-CM | POA: Diagnosis not present

## 2022-02-06 DIAGNOSIS — E876 Hypokalemia: Secondary | ICD-10-CM | POA: Diagnosis not present

## 2022-02-06 DIAGNOSIS — K219 Gastro-esophageal reflux disease without esophagitis: Secondary | ICD-10-CM | POA: Diagnosis not present

## 2022-02-08 DIAGNOSIS — M159 Polyosteoarthritis, unspecified: Secondary | ICD-10-CM | POA: Diagnosis not present

## 2022-02-08 DIAGNOSIS — Z981 Arthrodesis status: Secondary | ICD-10-CM | POA: Diagnosis not present

## 2022-02-08 DIAGNOSIS — I5032 Chronic diastolic (congestive) heart failure: Secondary | ICD-10-CM | POA: Diagnosis not present

## 2022-02-08 DIAGNOSIS — N1831 Chronic kidney disease, stage 3a: Secondary | ICD-10-CM | POA: Diagnosis not present

## 2022-02-08 DIAGNOSIS — R7989 Other specified abnormal findings of blood chemistry: Secondary | ICD-10-CM | POA: Diagnosis not present

## 2022-02-08 DIAGNOSIS — M5136 Other intervertebral disc degeneration, lumbar region: Secondary | ICD-10-CM | POA: Diagnosis not present

## 2022-02-08 DIAGNOSIS — I10 Essential (primary) hypertension: Secondary | ICD-10-CM | POA: Diagnosis not present

## 2022-02-08 DIAGNOSIS — F339 Major depressive disorder, recurrent, unspecified: Secondary | ICD-10-CM | POA: Diagnosis not present

## 2022-02-08 DIAGNOSIS — D696 Thrombocytopenia, unspecified: Secondary | ICD-10-CM | POA: Diagnosis not present

## 2022-02-09 DIAGNOSIS — H35439 Paving stone degeneration of retina, unspecified eye: Secondary | ICD-10-CM | POA: Diagnosis not present

## 2022-02-09 DIAGNOSIS — H43819 Vitreous degeneration, unspecified eye: Secondary | ICD-10-CM | POA: Diagnosis not present

## 2022-02-09 DIAGNOSIS — H35433 Paving stone degeneration of retina, bilateral: Secondary | ICD-10-CM | POA: Diagnosis not present

## 2022-02-09 DIAGNOSIS — H353231 Exudative age-related macular degeneration, bilateral, with active choroidal neovascularization: Secondary | ICD-10-CM | POA: Diagnosis not present

## 2022-02-13 DIAGNOSIS — M47812 Spondylosis without myelopathy or radiculopathy, cervical region: Secondary | ICD-10-CM | POA: Diagnosis not present

## 2022-02-28 ENCOUNTER — Other Ambulatory Visit (INDEPENDENT_AMBULATORY_CARE_PROVIDER_SITE_OTHER): Payer: Self-pay | Admitting: Gastroenterology

## 2022-02-28 DIAGNOSIS — K582 Mixed irritable bowel syndrome: Secondary | ICD-10-CM

## 2022-03-08 DIAGNOSIS — M47812 Spondylosis without myelopathy or radiculopathy, cervical region: Secondary | ICD-10-CM | POA: Diagnosis not present

## 2022-04-18 DIAGNOSIS — Z6839 Body mass index (BMI) 39.0-39.9, adult: Secondary | ICD-10-CM | POA: Diagnosis not present

## 2022-04-18 DIAGNOSIS — M47816 Spondylosis without myelopathy or radiculopathy, lumbar region: Secondary | ICD-10-CM | POA: Diagnosis not present

## 2022-04-18 DIAGNOSIS — M47812 Spondylosis without myelopathy or radiculopathy, cervical region: Secondary | ICD-10-CM | POA: Diagnosis not present

## 2022-04-20 DIAGNOSIS — H353231 Exudative age-related macular degeneration, bilateral, with active choroidal neovascularization: Secondary | ICD-10-CM | POA: Diagnosis not present

## 2022-04-20 DIAGNOSIS — H35433 Paving stone degeneration of retina, bilateral: Secondary | ICD-10-CM | POA: Diagnosis not present

## 2022-04-20 DIAGNOSIS — H43813 Vitreous degeneration, bilateral: Secondary | ICD-10-CM | POA: Diagnosis not present

## 2022-05-03 DIAGNOSIS — R7989 Other specified abnormal findings of blood chemistry: Secondary | ICD-10-CM | POA: Diagnosis not present

## 2022-05-03 DIAGNOSIS — Z13220 Encounter for screening for lipoid disorders: Secondary | ICD-10-CM | POA: Diagnosis not present

## 2022-05-03 DIAGNOSIS — R739 Hyperglycemia, unspecified: Secondary | ICD-10-CM | POA: Diagnosis not present

## 2022-05-03 DIAGNOSIS — E876 Hypokalemia: Secondary | ICD-10-CM | POA: Diagnosis not present

## 2022-05-03 DIAGNOSIS — N189 Chronic kidney disease, unspecified: Secondary | ICD-10-CM | POA: Diagnosis not present

## 2022-05-03 DIAGNOSIS — I10 Essential (primary) hypertension: Secondary | ICD-10-CM | POA: Diagnosis not present

## 2022-05-10 DIAGNOSIS — I5032 Chronic diastolic (congestive) heart failure: Secondary | ICD-10-CM | POA: Diagnosis not present

## 2022-05-10 DIAGNOSIS — Z981 Arthrodesis status: Secondary | ICD-10-CM | POA: Diagnosis not present

## 2022-05-10 DIAGNOSIS — N1831 Chronic kidney disease, stage 3a: Secondary | ICD-10-CM | POA: Diagnosis not present

## 2022-05-10 DIAGNOSIS — M159 Polyosteoarthritis, unspecified: Secondary | ICD-10-CM | POA: Diagnosis not present

## 2022-05-10 DIAGNOSIS — I10 Essential (primary) hypertension: Secondary | ICD-10-CM | POA: Diagnosis not present

## 2022-05-10 DIAGNOSIS — F339 Major depressive disorder, recurrent, unspecified: Secondary | ICD-10-CM | POA: Diagnosis not present

## 2022-05-10 DIAGNOSIS — R7989 Other specified abnormal findings of blood chemistry: Secondary | ICD-10-CM | POA: Diagnosis not present

## 2022-05-10 DIAGNOSIS — M5136 Other intervertebral disc degeneration, lumbar region: Secondary | ICD-10-CM | POA: Diagnosis not present

## 2022-05-10 DIAGNOSIS — D696 Thrombocytopenia, unspecified: Secondary | ICD-10-CM | POA: Diagnosis not present

## 2022-05-23 DIAGNOSIS — F339 Major depressive disorder, recurrent, unspecified: Secondary | ICD-10-CM | POA: Diagnosis not present

## 2022-05-29 DIAGNOSIS — L821 Other seborrheic keratosis: Secondary | ICD-10-CM | POA: Diagnosis not present

## 2022-05-29 DIAGNOSIS — L218 Other seborrheic dermatitis: Secondary | ICD-10-CM | POA: Diagnosis not present

## 2022-05-29 DIAGNOSIS — L570 Actinic keratosis: Secondary | ICD-10-CM | POA: Diagnosis not present

## 2022-05-29 DIAGNOSIS — D239 Other benign neoplasm of skin, unspecified: Secondary | ICD-10-CM | POA: Diagnosis not present

## 2022-05-29 DIAGNOSIS — D485 Neoplasm of uncertain behavior of skin: Secondary | ICD-10-CM | POA: Diagnosis not present

## 2022-05-29 DIAGNOSIS — L304 Erythema intertrigo: Secondary | ICD-10-CM | POA: Diagnosis not present

## 2022-06-05 DIAGNOSIS — F339 Major depressive disorder, recurrent, unspecified: Secondary | ICD-10-CM | POA: Diagnosis not present

## 2022-06-07 DIAGNOSIS — M810 Age-related osteoporosis without current pathological fracture: Secondary | ICD-10-CM | POA: Diagnosis not present

## 2022-07-03 ENCOUNTER — Telehealth (INDEPENDENT_AMBULATORY_CARE_PROVIDER_SITE_OTHER): Payer: Self-pay | Admitting: Gastroenterology

## 2022-07-03 ENCOUNTER — Encounter (INDEPENDENT_AMBULATORY_CARE_PROVIDER_SITE_OTHER): Payer: Self-pay | Admitting: Gastroenterology

## 2022-07-03 ENCOUNTER — Ambulatory Visit (INDEPENDENT_AMBULATORY_CARE_PROVIDER_SITE_OTHER): Payer: Medicare PPO | Admitting: Gastroenterology

## 2022-07-03 VITALS — BP 156/75 | HR 63 | Temp 98.8°F | Ht 63.0 in | Wt 222.6 lb

## 2022-07-03 DIAGNOSIS — R1032 Left lower quadrant pain: Secondary | ICD-10-CM | POA: Diagnosis not present

## 2022-07-03 DIAGNOSIS — K649 Unspecified hemorrhoids: Secondary | ICD-10-CM

## 2022-07-03 DIAGNOSIS — K5900 Constipation, unspecified: Secondary | ICD-10-CM | POA: Diagnosis not present

## 2022-07-03 DIAGNOSIS — K219 Gastro-esophageal reflux disease without esophagitis: Secondary | ICD-10-CM | POA: Diagnosis not present

## 2022-07-03 NOTE — Telephone Encounter (Signed)
CT abd/pelvis with contrast approved via [facility] number [id number]  Scheduled for 08/28/22 at 5 pm. Pt is to arrive at 3 pm. Jeani Hawking Pt contacted and made aware. Appt reminder sent via mail to pt.

## 2022-07-03 NOTE — Patient Instructions (Signed)
Start taking Miralax 1 capful every day for one week. If bowel movements do not improve, increase to 1 capful every 12 hours. If after two weeks there is no improvement, increase to 1 capful every 8 hours Increase water intake, aim for atleast 64 oz per day Increase fruits, veggies and whole grains, kiwi and prunes are especially good for constipation  I will send [facility] hemorrhoid cream for you to use up to 4x/day, you can pick this up once you are able, try to avoid straining and limit sitting on the toilet for long periods of time  you Can do beano or gas x for gas  Continue with omprazole 20mg  daily I will discuss repeat EGD for your barrett's esophagus with Dr. Levon Hedger  Continue daily probiotic  We will Schedule CT of your stomach for further evaluation of your pain and tenderness  Follow up 3 months

## 2022-07-03 NOTE — Progress Notes (Signed)
Referring Provider: Juliette Alcide, MD Primary Care Physician:  Juliette Alcide, MD Primary GI Physician: Levon Hedger   Chief Complaint  Patient presents with   Abdominal Pain    Patient states she has been having abdominal pain for a while and getting worse. States she had xray about a year ago at [facility] and was told she had a spot on spleen and wonders if that is causing her pain. Has noticed some foods make her pain worse like green beans.    Hemorrhoids    Using hydrocortisone cream and not helping much. Has went through three tubes.    HPI:   DELCIA SPITZLEY is a 82 y.o. female with past medical history of  IBS-C, IDA, Gerd, Barrett's esophagus, anxiety, depression, HTN, macular degeneration   Patient presenting today for follow up of abdominal pain and hemorrhoids  Last seen April 2023, at that time having some episoe of constipation, taking 1/2 capful miralax daily, sometimes a full capful, though can go 4-5 days without a BM, she will then take docusate. Having some bloating. Noted burning sensation in her abdomen not improved with bentyl which actually caused her to have nausea. Taking omeprazole 20mg  BID which she feels may have helped her burning sensation some.   Recommended to increase miralax, CT A/P with contrast, stop dicyclomine, take IBgard PRN for abdominal pain.   CT A/P 10/2021 with no explanation of patient's pain, stable 16mm pancreatic cyst. MRI abd w wo contrast 03/2021 with benign appearing splenic lesions (no further evaluation/follow up needed)  Present:  Patient reports ongoing issues with her stomach though this has gotten worse recently she feels. She feels at times when she is eating her "food stops and wont go" she can get up and move around which seems to help. She notes that food feels like it sits in her stomach, she assumes maybe where she had previous gastric bypass. She also notes some LLQ pain. Weight is stable. She has some intermittent nausea/vomiting  though none in the past month or so. She has a lot of gas pains, she tried the IBgard but notes she could taste the olive oil that is in it for a while after taking so she stopped using it. She is having a BM a few times per week, she is taking colace every other day. She is doing miralax, 1/2-1 capful per day. Notes that stools are hard at times requiring straining. She has not tried increasing her miralax, her water intake is not good.   She denies GERD symptoms unless she eats something spicy or tomato based. She is taking her omeprazole 20mg  daily.  She has occasional issues with foods not wanting to go down though this does not happen often as she tries to take small bites to avoid choking.   She notes a couple of episodes of rectal bleeding with a smear on the toilet tissue on occasion, she can feel the hemorrhoids at times which feel irritated/sometimes painful when sitting. Her PCP gave her some hydrocortisone cream which she has used 3 tubes of with some relief of symptoms but hemorrhoids are still present.   Last Colonoscopy:04/29/20 Two 3 to 5 mm polyps in the cecum,  - Three 3 to 6 mm polyps in the ascending colon,  - Four 4 to 8 mm polyps in the transverse colon,  - Seven 3 to 9 mm polyps in the descending colon,  - Diverticulosis in the sigmoid colon and in the descending colon. -  The distal rectum and anal verge are normal on retroflexion view. (Tubular adenomas, sessile serrated polyps, hyperplastic polyps) no repeat recommended    Last Endoscopy:09/03/19- Normal hypopharynx. - Normal proximal esophagus and mid esophagus. - Diverticulum in the distal esophagus. - Esophageal mucosal changes secondary to established short-segment Barrett's disease.(Confirmed BE, short segment) - Z-line regular, 34 cm from the incisors. - Gastric bypass with a small-sized pouch and intact staple line. Gastrojejunal anastomosis characterized by healthy appearing mucosa. - Normal gastric body. - Normal  examined jejunum.   Past Medical History:  Diagnosis Date   Anemia    Anxiety    Arthritis    Barrett's esophagus    [facility] on [facility]d [facility]   Depression    GERD (gastroesophageal reflux disease)    H/O hiatal hernia    Headache    Hypertension    Macular degeneration    Obesity    Pneumonia    PONV (postoperative nausea and vomiting)    Seasonal allergies    Sepsis (HCC) 10/02/2013    Past Surgical History:  Procedure Laterality Date   ABDOMINAL HYSTERECTOMY     ABDOMINAL SURGERY     APPENDECTOMY     BACK SURGERY     lumbar x2 by Dr Lovell Sheehan   BIOPSY  09/03/2019   Procedure: BIOPSY;  Surgeon: Malissa Hippo, MD;  [facility];  Service: Endoscopy;;  esophagus   CARPAL TUNNEL RELEASE Right 03/2014   Dr. Ophelia Charter   CATARACT EXTRACTION W/PHACO Left 08/06/2013   Procedure: CATARACT EXTRACTION PHACO AND INTRAOCULAR LENS PLACEMENT LEFT EYE;  Surgeon: Gemma Payor, MD;  Location: [facility];  Service: Ophthalmology;  Laterality: Left;  CDE: 9.55   CATARACT EXTRACTION W/PHACO Right 08/24/2013   Procedure: CATARACT EXTRACTION PHACO AND INTRAOCULAR LENS PLACEMENT (IOC);  Surgeon: Gemma Payor, MD;  Location: [facility];  Service: Ophthalmology;  Laterality: Right;  CDE:8.30   CERVICAL FUSION     CHOLECYSTECTOMY     COLONOSCOPY N/A 11/03/2013   SLF: 1. Normal mucosa in the terminal ileum. 2. 13 colon polyps removed. 3. Moderate diverticulosis in the descending colon and sigmoid colon 4. the left colon is redundant 5. Small internal  hemorrhoids.   COLONOSCOPY N/A 09/25/2017   Procedure: COLONOSCOPY;  Surgeon: Malissa Hippo, MD;  [facility];  Service: Endoscopy;  Laterality: N/A;  1:55   COLONOSCOPY WITH PROPOFOL N/A 04/29/2020   Castaneda: multiple polyps (tubular adenoma, sessile serrated, hyperplastic) ascending colon, transverse colon, descending colon, diverticulsos in sigmoid and descending colon, rectum/anal vergse normal   ESOPHAGOGASTRODUODENOSCOPY N/A  11/03/2013   SLF: 1. 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 Mild Non-erosive gastritis   ESOPHAGOGASTRODUODENOSCOPY  10/2014   [facility]: IMPRESSIONS: - likely small distal esophageal diverticulum without evidence of fistula, +barrett's esophagus without dysplasia. s/p gastric bypass   ESOPHAGOGASTRODUODENOSCOPY (EGD) WITH PROPOFOL N/A 09/03/2019   rehman: normal hypopharynx, esophagus, mid esophagus, diverticulum in distal esophagus, short segment barretts esophagus, gastric bypass with small sized pouch, gastrojejunal anastomosis with healthy appearing mucosa, normal gastric body and jejunum   EYE SURGERY     GASTRIC BYPASS  1979   revision in 1980    HIATAL HERNIA REPAIR     [facility] in 2011   JOINT REPLACEMENT Right 1995   hip   JOINT REPLACEMENT Left 2008   hip   MEDIAL PARTIAL KNEE REPLACEMENT Left    Dr Sherlean Foot   PARAESOPHAGEAL HERNIA REPAIR  SEP 2010 DR. MCNATT   POLYPECTOMY  09/25/2017   Procedure: POLYPECTOMY;  Surgeon: Malissa Hippo, MD;  [facility];  Service: Endoscopy;;  colon   POLYPECTOMY  04/29/2020   Procedure: POLYPECTOMY;  Surgeon: Dolores Frame, MD;  [facility];  Service: Gastroenterology;;   SHOULDER ARTHROSCOPY Right    SHOULDER ARTHROSCOPY Right    x2   SKIN BIOPSY     TONSILLECTOMY     TOTAL KNEE ARTHROPLASTY Right 09/08/2018   Procedure: RIGHT TOTAL KNEE ARTHROPLASTY;  Surgeon: Eldred Manges, MD;  Location: [facility];  Service: Orthopedics;  Laterality: Right;   TOTAL SHOULDER ARTHROPLASTY Right 02/16/2013   Procedure: TOTAL SHOULDER ARTHROPLASTY;  Surgeon: Eldred Manges, MD;  Location: [facility];  Service: Orthopedics;  Laterality: Right;  Right Total Shoulder Arthroplasty, Cemented   WEIL OSTEOTOMY Right 07/18/2021   Procedure: RIGHT FOOT REVISION WEIL OSTEOTOMY 2ND AND 3RD METATARSAL, GASTROCNEMIUS RECESSION, PROXIMAL INTERPHALANGEAL RESECTION 2ND AND 3RD TOES;  Surgeon: Nadara Mustard, MD;  Location: [facility];  Service: Orthopedics;  Laterality: Right;  regional with monitored anesthesia care    Current Outpatient Medications  Medication Sig Dispense Refill   acetaminophen (TYLENOL) 500 MG tablet Take 500-1,000 mg by mouth every 6 (six) hours as needed for moderate pain or headache.     albuterol (PROVENTIL) (2.5 MG/3ML) 0.083% nebulizer solution Take 3 mLs (2.5 mg total) by nebulization every 4 (four) hours as needed for wheezing. (Patient taking differently: Take 2.5 mg by nebulization every 6 (six) hours as needed for wheezing.) 75 mL 12   alendronate (FOSAMAX) 70 MG tablet Take 70 mg by mouth every Tuesday.      AZO-CRANBERRY PO Take 2 tablets by mouth daily as needed (kidney infections).      Calcium-Phosphorus-Vitamin D (CITRACAL +D3 PO) Take 1 tablet by mouth daily.     Cholecalciferol (VITAMIN D3) 50 MCG (2000 UT) capsule Take 2,000 Units by mouth daily.      docusate sodium (COLACE) 100 MG capsule Take 100 mg by mouth at bedtime as needed for mild constipation.      Ferrous Sulfate (SLOW FE PO) Take 1 tablet by mouth daily. Takes once a week     fluticasone (FLONASE) 50 MCG/ACT nasal spray Place 2 sprays into both nostrils daily as needed for allergies.     Hydrocortisone 2 % CREA Apply topically. As needed     Lactobacillus (FLORAJEN ACIDOPHILUS) CAPS Take 1 capsule by mouth daily before breakfast.     lisinopril (ZESTRIL) 10 MG tablet Take 10 mg by mouth every morning.     Nystatin (GERHARDT'S BUTT CREAM) CREA Apply 1 application topically 3 (three) times daily as needed for irritation. 1 each 11   nystatin ointment (MYCOSTATIN) Apply 1 application topically 2 (two) times daily as needed (for irritation).     omeprazole (PRILOSEC) 20 MG capsule Take 1 capsule (20 mg total) by mouth daily. 90 capsule 3   ondansetron (ZOFRAN) 4 MG tablet Take 4 mg by mouth every 8 (eight) hours as needed for nausea or vomiting.     sertraline (ZOLOFT) 100 MG  tablet Take 100 mg by mouth daily.     traZODone (DESYREL) 50 MG tablet Take 50 mg by mouth at bedtime.     conjugated estrogens (PREMARIN) vaginal cream Place 1 Applicatorful vaginally at bedtime. Use as directed at bedtime (Patient not taking: Reported on 07/03/2022) 30 g 12   No current facility-administered medications for  this visit.    Allergies as of 07/03/2022 - Review Complete 07/03/2022  Allergen Reaction Noted   Gadavist [gadobutrol] Itching and Swelling 07/05/2021   Novocain [procaine hcl] Other (See Comments) 11/20/2011   Other Hives and Other (See Comments) 11/20/2011   Latex Rash 07/22/2013   Penicillins Rash and Other (See Comments) 11/20/2011   Sulfa antibiotics Rash 11/20/2011    Family History  Problem Relation Age of Onset   Breast cancer Mother    Hypertension Father    Heart disease Father    Hypertension Sister    Colon cancer Brother        IN HIS 60s-METASTATIC   Hypertension Brother    Heart disease Brother    Colon polyps Paternal Grandfather     Social History   Socioeconomic History   Marital status: Single    Spouse name: Not on file   Number of children: 0   Years of education: Not on file   Highest education level: Some college, no degree  Occupational History   Not on file  Tobacco Use   Smoking status: Never    Passive exposure: Past   Smokeless tobacco: Never  Vaping Use   Vaping Use: Never used  Substance and Sexual Activity   Alcohol use: No   Drug use: No   Sexual activity: Yes    Birth control/protection: Surgical  Other Topics Concern   Not on file  Social History Narrative   12/26/21 lives alone   Social Determinants of Health   Financial Resource Strain: Not on file  Food Insecurity: No Food Insecurity (12/29/2021)   Hunger Vital Sign    Worried About Running Out of Food in the Last Year: Never true    Ran Out of Food in the Last Year: Never true  Transportation Needs: No Transportation Needs (12/30/2021)    PRAPARE - Administrator, Civil Service (Medical): No    Lack of Transportation (Non-Medical): No  Physical Activity: Not on file  Stress: Not on file  Social Connections: Not on file   Review of systems General: negative for malaise, night sweats, fever, chills, weight loss Neck: Negative for lumps, goiter, pain and significant neck swelling Resp: Negative for cough, wheezing, dyspnea at rest CV: Negative for chest pain, leg swelling, palpitations, orthopnea GI: denies melena, hematochezia, nausea, vomiting, diarrhea, dysphagia, odyonophagia, early satiety or unintentional weight loss. +abdominal pain +constipation  MSK: Negative for joint pain or swelling, back pain, and muscle pain. Derm: Negative for itching or rash Psych: Denies depression, anxiety, memory loss, confusion. No homicidal or suicidal ideation.  Heme: Negative for prolonged bleeding, bruising easily, and swollen nodes. Endocrine: Negative for cold or heat intolerance, polyuria, polydipsia and goiter. Neuro: negative for tremor, gait imbalance, syncope and seizures. The remainder of the review of systems is noncontributory.  Physical Exam: BP (!) 156/75   Pulse 63   Temp 98.8 F (37.1 C) (Oral)   Ht 5\' 3"  (1.6 m)   Wt 222 lb 9.6 oz (101 kg)   BMI 39.43 kg/m  General:   Alert and oriented. No distress noted. Pleasant and cooperative.  Head:  Normocephalic and atraumatic. Eyes:  Conjuctiva clear without scleral icterus. Mouth:  Oral mucosa pink and moist. Good dentition. No lesions. Heart: Normal rate and rhythm, s1 and s2 heart sounds present.  Lungs: Clear lung sounds in all lobes. Respirations equal and unlabored. Abdomen:  +BS, soft, non-tender and non-distended. No rebound or guarding. No HSM or masses noted.  Rectal: declined  Derm: No palmar erythema or jaundice Msk:  Symmetrical without gross deformities. Normal posture. Extremities:  Without edema. Neurologic:  Alert and  oriented x4 Psych:   Alert and cooperative. Normal mood and affect.  Invalid input(s): "6 MONTHS"   ASSESSMENT: OUIDA ABEYTA is a 82 y.o. female presenting today for follow up   GERD/BE: symptoms well controlled on omeprazole 20mg  daily. Last EGD in 2021 with biopsy confirmed BE. She has occasional dysphagia though tries to take small bites and chew well which seems to keep issues at bay. Will discuss repeat EGD for BE with Dr. Levon Hedger given patient is 25, she may not require further surveillance EGD. Should continue with PPI daily and reflux precautions.   Abdominal pain: ongoing abdominal pain thought secondary to IBS, though she notes worsening pain recently, as well as flatulence, pain more in LLQ, sometimes RLQ. She is having some constipation, taking colace and miralax 1/2-1 capful per day, she did not increase her miralax dose at last visit, discussed need for good bowel regimen and good water intake, will increase miralax, titration instructions provided. Query if her pain is IBS or constipation related, however, abdomen is quite tender on palpation of LLQ and somewhat in RLQ. she has history of diverticulosis. Will get CT A/P to rule out diverticulitis.   Hemorrhoids: some irritation and occasional toilet tissue hematochezia, she has used hydrocortisone cream with some relief of symptoms though notes hemorrhoid do not feel like they ever go away. Will send [facility] Hemorrhoid compound for her to use QID. As above, need to ensure good water intake and good bowel regimen, avoid straining and limit toilet time.    PLAN:  Start taking Miralax 1 capful every day for one week. If bowel movements do not improve, increase to 1 capful every 12 hours. If after two weeks there is no improvement, increase to 1 capful every 8 hours 2.  [facility] hemorrhoid cream  3. Can do beano or gas x for flatulence  4. Continue with omprazole 20mg  daily 5. Discuss need for repeat EGD for BE surveillance with Dr.  Levon Hedger  6. Continue daily probiotic  7. Schedule CT A/P w contrast   All questions were answered, patient verbalized understanding and is in agreement with plan as outlined above.   Follow Up: 3 months   Donneisha Beane L. Jeanmarie Hubert, MSN, APRN, AGNP-C Adult-Gerontology Nurse Practitioner [facility]

## 2022-07-05 ENCOUNTER — Telehealth (INDEPENDENT_AMBULATORY_CARE_PROVIDER_SITE_OTHER): Payer: Self-pay | Admitting: Gastroenterology

## 2022-07-09 NOTE — Telephone Encounter (Signed)
Pt contacted and EGD scheduled.  Instructions sent via mail. Will contact pt with pre op appt.  Pa via [employer] approved Authorization #[id number] DOS:07/09/22-10/09/22

## 2022-07-10 DIAGNOSIS — H43813 Vitreous degeneration, bilateral: Secondary | ICD-10-CM | POA: Diagnosis not present

## 2022-07-10 DIAGNOSIS — H353231 Exudative age-related macular degeneration, bilateral, with active choroidal neovascularization: Secondary | ICD-10-CM | POA: Diagnosis not present

## 2022-07-10 DIAGNOSIS — H35433 Paving stone degeneration of retina, bilateral: Secondary | ICD-10-CM | POA: Diagnosis not present

## 2022-07-17 ENCOUNTER — Encounter (INDEPENDENT_AMBULATORY_CARE_PROVIDER_SITE_OTHER): Payer: Self-pay

## 2022-08-09 DIAGNOSIS — M5416 Radiculopathy, lumbar region: Secondary | ICD-10-CM | POA: Diagnosis not present

## 2022-08-15 ENCOUNTER — Encounter (HOSPITAL_COMMUNITY): Payer: Self-pay

## 2022-08-15 ENCOUNTER — Other Ambulatory Visit: Payer: Self-pay

## 2022-08-15 ENCOUNTER — Emergency Department (HOSPITAL_COMMUNITY): Payer: Medicare PPO

## 2022-08-15 ENCOUNTER — Telehealth: Payer: Self-pay | Admitting: *Deleted

## 2022-08-15 ENCOUNTER — Emergency Department (HOSPITAL_COMMUNITY)
Admission: EM | Admit: 2022-08-15 | Discharge: 2022-08-16 | Disposition: A | Discharge status: Home / Self Care | Payer: Medicare PPO | Attending: Emergency Medicine | Admitting: Emergency Medicine

## 2022-08-15 DIAGNOSIS — R7401 Elevation of levels of liver transaminase levels: Secondary | ICD-10-CM | POA: Diagnosis not present

## 2022-08-15 DIAGNOSIS — Z79899 Other long term (current) drug therapy: Secondary | ICD-10-CM | POA: Insufficient documentation

## 2022-08-15 DIAGNOSIS — R1031 Right lower quadrant pain: Secondary | ICD-10-CM | POA: Diagnosis present

## 2022-08-15 DIAGNOSIS — Z91040 Latex allergy status: Secondary | ICD-10-CM | POA: Diagnosis not present

## 2022-08-15 DIAGNOSIS — I10 Essential (primary) hypertension: Secondary | ICD-10-CM | POA: Insufficient documentation

## 2022-08-15 DIAGNOSIS — R109 Unspecified abdominal pain: Secondary | ICD-10-CM | POA: Diagnosis not present

## 2022-08-15 DIAGNOSIS — R112 Nausea with vomiting, unspecified: Secondary | ICD-10-CM | POA: Insufficient documentation

## 2022-08-15 DIAGNOSIS — K5900 Constipation, unspecified: Secondary | ICD-10-CM | POA: Diagnosis not present

## 2022-08-15 DIAGNOSIS — I700 Atherosclerosis of aorta: Secondary | ICD-10-CM | POA: Diagnosis not present

## 2022-08-15 LAB — URINALYSIS, ROUTINE W REFLEX MICROSCOPIC
Bilirubin Urine: NEGATIVE
Glucose, UA: NEGATIVE mg/dL
Hgb urine dipstick: NEGATIVE
Ketones, ur: NEGATIVE mg/dL
Nitrite: NEGATIVE
Protein, ur: NEGATIVE mg/dL
Specific Gravity, Urine: 1.026 (ref 1.005–1.030)
pH: 5 (ref 5.0–8.0)

## 2022-08-15 LAB — COMPREHENSIVE METABOLIC PANEL
ALT: 46 U/L — ABNORMAL HIGH (ref 0–44)
AST: 33 U/L (ref 15–41)
Albumin: 4.3 g/dL (ref 3.5–5.0)
Alkaline Phosphatase: 97 U/L (ref 38–126)
Anion gap: 9 (ref 5–15)
BUN: 25 mg/dL — ABNORMAL HIGH (ref 8–23)
CO2: 27 mmol/L (ref 22–32)
Calcium: 8.7 mg/dL — ABNORMAL LOW (ref 8.9–10.3)
Chloride: 102 mmol/L (ref 98–111)
Creatinine, Ser: 0.9 mg/dL (ref 0.44–1.00)
GFR, Estimated: >60 mL/min (ref >60)
Glucose, Bld: 128 mg/dL — ABNORMAL HIGH (ref 70–99)
Potassium: 4.2 mmol/L (ref 3.5–5.1)
Sodium: 138 mmol/L (ref 135–145)
Total Bilirubin: 0.6 mg/dL (ref 0.3–1.2)
Total Protein: 6.9 g/dL (ref 6.5–8.1)

## 2022-08-15 LAB — CBC
HCT: 42.8 % (ref 36.0–46.0)
Hemoglobin: 13.5 g/dL (ref 12.0–15.0)
MCH: 29.7 pg (ref 26.0–34.0)
MCHC: 31.5 g/dL (ref 30.0–36.0)
MCV: 94.3 fL (ref 80.0–100.0)
Platelets: 149 10*3/uL — ABNORMAL LOW (ref 150–400)
RBC: 4.54 MIL/uL (ref 3.87–5.11)
RDW: 14.3 % (ref 11.5–15.5)
WBC: 7 10*3/uL (ref 4.0–10.5)
nRBC: 0 % (ref 0.0–0.2)

## 2022-08-15 LAB — LIPASE, BLOOD: Lipase: 28 U/L (ref 11–51)

## 2022-08-15 LAB — LACTIC ACID, PLASMA
Lactic Acid, Venous: 0.6 mmol/L (ref 0.5–1.9)
Lactic Acid, Venous: 0.5 mmol/L (ref 0.5–1.9)

## 2022-08-15 MED ORDER — IOHEXOL 300 MG/ML  SOLN
100.0000 mL | Freq: Once | INTRAMUSCULAR | Status: AC | PRN
  Administered 2022-08-15: 100 mL via INTRAVENOUS

## 2022-08-15 NOTE — Telephone Encounter (Signed)
Patient seen 07/03/22 and having vomiting and diarrhea off and on and abdominal pain. States pain is a shooting sharp pain in lower right side. Worse when coughing. Has to hold her side to cough to keep it from moving. Tender and sore. Had temp last week 99.6 and highest 101. None this week. She is eating and drinking normal. Taking gas x and zofran. Had two days last week she had vomiting and diarrhea and it stopped til today. Has egd 6/18 and ct 6/25  [facility].   [phone]

## 2022-08-15 NOTE — ED Provider Notes (Signed)
[place] EMERGENCY DEPARTMENT AT [facility] Provider Note   CSN: [id number] Arrival date & time: 08/15/22  1706     History  Chief Complaint  Patient presents with   Abdominal Pain    [name] [name] is a 82 y.o. female with a history including GERD, hypertension, and surgical history significant for appendectomy, abdominal hysterectomy and cholecystectomy presenting for evaluation of abdominal pain.  She describes intermittent lower bilateral abdominal pain which has been present for the past 2 to 3 weeks, describing aching sharp pain in association with nausea and vomiting and has had several episodes of diarrhea, although generally reports having constipation, describing hard stools every 2 to 3 days despite daily use of MiraLAX.  She is found no alleviators for symptoms.  No recognized pattern, not specifically worse after eating meals.  She has had no fevers or chills.  She has been in contact with her primary doctor who is ordered an outpatient CT scan but when her pain returned today she decided to come here.  She is found no alleviators for her symptoms.  She denies abdominal distention she is passing flatus, no blood in stools.  She denies dysuria, flank pain.  Also denies chest pain, shortness of breath.  She has found no alleviators for symptoms.  The history is provided by the patient.       Home Medications Prior to Admission medications   Medication Sig Start Date End Date Taking? Authorizing Provider  acetaminophen (TYLENOL) 500 MG tablet Take 500-1,000 mg by mouth every 6 (six) hours as needed for moderate pain or headache.    [provider]  albuterol (PROVENTIL) (2.5 MG/3ML) 0.083% nebulizer solution Take 3 mLs (2.5 mg total) by nebulization every 4 (four) hours as needed for wheezing. 07/21/15   Erick Blinks, MD  alendronate (FOSAMAX) 70 MG tablet Take 70 mg by mouth once a week. 01/26/19   [provider]  AZO-CRANBERRY PO Take 2 tablets  by mouth daily as needed (kidney infections).     [provider]  Calcium-Phosphorus-Vitamin D (CITRACAL +D3 PO) Take 1 tablet by mouth daily.    [provider]  Cholecalciferol (VITAMIN D3) 50 MCG (2000 UT) capsule Take 2,000 Units by mouth daily.     [provider]  conjugated estrogens (PREMARIN) vaginal cream Place 1 Applicatorful vaginally at bedtime. Use as directed at bedtime Patient not taking: Reported on 07/03/2022 03/08/20   Lazaro Arms, MD  docusate sodium (COLACE) 100 MG capsule Take 100 mg by mouth at bedtime as needed for mild constipation.     [provider]  Ferrous Sulfate (SLOW FE PO) Take 1 tablet by mouth every 7 (seven) days.    [provider]  fluticasone (FLONASE) 50 MCG/ACT nasal spray Place 2 sprays into both nostrils daily as needed for allergies. 07/07/19   [provider]  hydrocortisone (ANUSOL-HC) 2.5 % rectal cream Apply 1 Application topically 3 (three) times daily as needed for hemorrhoids or anal itching. 07/12/22   [provider]  ketoconazole (NIZORAL) 2 % cream Apply 1 Application topically 2 (two) times daily as needed for irritation.    [provider]  ketoconazole (NIZORAL) 2 % shampoo Apply 1 Application topically every 7 (seven) days.    [provider]  Lactobacillus (FLORAJEN ACIDOPHILUS) CAPS Take 1 capsule by mouth daily before breakfast.    [provider]  lisinopril (ZESTRIL) 10 MG tablet Take 10 mg by mouth every morning. 08/08/20  [provider]  Nystatin (GERHARDT'S BUTT CREAM) CREA Apply 1 application topically 3 (three) times daily as needed for irritation. Patient not taking: Reported on 08/15/2022 09/06/20   Lazaro Arms, MD  nystatin ointment (MYCOSTATIN) Apply 1 application topically 2 (two) times daily as needed (for irritation).    [provider]  omeprazole (PRILOSEC) 20 MG capsule Take 1 capsule (20 mg total) by mouth  daily. Patient taking differently: Take 20 mg by mouth in the morning and at bedtime. 12/06/20   Carlan, Chelsea L, NP  ondansetron (ZOFRAN) 4 MG tablet Take 4 mg by mouth every 8 (eight) hours as needed for nausea or vomiting.    [provider]  sertraline (ZOLOFT) 100 MG tablet Take 100 mg by mouth daily.    [provider]  simethicone (MYLICON) 125 MG chewable tablet Chew 125 mg by mouth every 6 (six) hours as needed for flatulence.    [provider]  traZODone (DESYREL) 50 MG tablet Take 50 mg by mouth at bedtime.    [provider]      Allergies    Gadavist [gadobutrol], Novocain [procaine hcl], Other, Latex, Penicillins, and Sulfa antibiotics    Review of Systems   Review of Systems  Constitutional:  Negative for fever.  HENT:  Negative for congestion and sore throat.   Eyes: Negative.   Respiratory:  Negative for chest tightness and shortness of breath.   Cardiovascular:  Negative for chest pain.  Gastrointestinal:  Positive for abdominal pain, constipation, diarrhea, nausea and vomiting.  Genitourinary: Negative.   Musculoskeletal:  Negative for arthralgias, joint swelling and neck pain.  Skin: Negative.  Negative for rash and wound.  Neurological:  Negative for dizziness, weakness, light-headedness, numbness and headaches.  Psychiatric/Behavioral: Negative.      Physical Exam Updated Vital Signs BP 123/62   Pulse 69   Temp 99 F (37.2 C) (Oral)   Resp 17   Ht 5\' 3"  (1.6 m)   Wt 102.5 kg   SpO2 95%   BMI 40.03 kg/m  Physical Exam Vitals and nursing note reviewed.  Constitutional:      Appearance: She is well-developed.  HENT:     Head: Normocephalic and atraumatic.  Eyes:     Conjunctiva/sclera: Conjunctivae normal.  Cardiovascular:     Rate and Rhythm: Normal rate and regular rhythm.     Heart sounds: Normal heart sounds.  Pulmonary:     Effort: Pulmonary effort is normal.     Breath sounds: Normal breath sounds. No  wheezing.  Abdominal:     General: Abdomen is protuberant. Bowel sounds are normal.     Palpations: Abdomen is soft.     Tenderness: There is abdominal tenderness in the right lower quadrant, suprapubic area and left lower quadrant. There is no guarding or rebound.  Musculoskeletal:        General: Normal range of motion.     Cervical back: Normal range of motion.  Skin:    General: Skin is warm and dry.  Neurological:     Mental Status: She is alert.     ED Results / Procedures / Treatments   Labs (all labs ordered are listed, but only abnormal results are displayed) Labs Reviewed  COMPREHENSIVE METABOLIC PANEL - Abnormal; Notable for the following components:      Result Value   Glucose, Bld 128 (*)    BUN 25 (*)    Calcium 8.7 (*)    ALT 46 (*)  All other components within normal limits  CBC - Abnormal; Notable for the following components:   Platelets 149 (*)    All other components within normal limits  URINALYSIS, ROUTINE W REFLEX MICROSCOPIC - Abnormal; Notable for the following components:   Leukocytes,Ua MODERATE (*)    Bacteria, UA RARE (*)    All other components within normal limits  LIPASE, BLOOD  LACTIC ACID, PLASMA  LACTIC ACID, PLASMA    EKG None  Radiology No results found.  CT ABDOMEN PELVIS W CONTRAST  Result Date: 08/15/2022 CLINICAL DATA:  Left lower quadrant pain. EXAM: CT ABDOMEN AND PELVIS WITH CONTRAST TECHNIQUE: Multidetector CT imaging of the abdomen and pelvis was performed using the standard protocol following bolus administration of intravenous contrast. RADIATION DOSE REDUCTION: This exam was performed according to the departmental dose-optimization program which includes automated exposure control, adjustment of the mA and/or kV according to patient size and/or use of iterative reconstruction technique. CONTRAST:  OMNIPAQUE IOHEXOL 300 MG/ML  SOLN COMPARISON:  CT abdomen and pelvis with IV contrast 10/20/2021, CT abdomen only  without and with contrast 05/09/2020 FINDINGS: Lower chest: Moderate calcifications in the aortic valve leaflets are again noted, partial calcification of the inferior mitral ring. The cardiac size is normal. There is no pericardial effusion. There is a small hiatal hernia. Scattered linear scarring or atelectasis both lung bases and a calcified left lower lobe granuloma. Lung bases are otherwise clear. Hepatobiliary: Small amount of central pneumobilia in both main hepatic lobes is again noted consistent with prior sphincterotomy. The liver enhances homogeneously. Gallbladder is absent without biliary dilatation. Pancreas: There is diffuse fatty atrophy and a stable 1.4 cm cystic lesion in the head of pancreas, Hounsfield density of 13, also unchanged since 2018 compatible with a benign process. No further imaging follow-up recommended. This is probably either a benign cyst or pseudocyst. No other focal pancreatic abnormality. Spleen: No mass or splenomegaly. Scattered calcified granulomas. Multiple surgical clips about the medial splenic hilum. Adrenals/Urinary Tract: There is no adrenal mass. No renal mass enhancement bilaterally. No intrarenal stones. The visualized ureters are normal caliber without stones, but the distal ureters, bladder, mid and lower pelvic sidewalls and pelvic floor are obscured due to extensive spray artifact from metallic hip replacements. 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 A surgically widened small bowel segment in the left mid abdomen is unchanged. No further bowel dilatation is seen and no thickening. Moderate retained stool ascending colon. An appendix is not seen in this patient. There is diverticulosis without evidence of diverticulitis. Vascular/Lymphatic: Aortic atherosclerosis. No enlarged abdominal or pelvic lymph nodes. Reproductive: Status post hysterectomy. No adnexal masses as far as visualized. The disc spaces are partially obscured by the  hip replacements. Other: Small umbilical fat hernia. No incarcerated hernia. No free hemorrhage, free fluid or free air. Musculoskeletal: Osteopenia and advanced degenerative change lumbar spine. Multilevel bridging syndesmophytes from the lower thoracic region to L2. 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 Advanced facet hypertrophy L4-5 and L5-S1. Ankylosis seen across the L4-5 facets. 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 No acute or other significant osseous findings or aggressive lesions. IMPRESSION: 1. No acute findings in the abdomen or pelvis to explain the patient's symptoms. 2. Constipation and diverticulosis. 3. Aortic atherosclerosis. 4. Aortic valve calcifications. Echocardiography may be helpful if not previously performed. 5. Osteopenia and degenerative change. 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 6. 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 No further imaging follow-up recommended. Aortic Atherosclerosis (  ICD10-I70.0). Electronically Signed   By: Almira Bar M.D.   On: 08/15/2022 23:35    Procedures Procedures    Medications Ordered in ED Medications  iohexol (OMNIPAQUE) 300 MG/ML solution 100 mL (100 mLs Intravenous Contrast Given 08/15/22 2257)  ondansetron (ZOFRAN-ODT) disintegrating tablet 4 mg (4 mg Oral Given 08/16/22 0053)    ED Course/ Medical Decision Making/ A&P                             Medical Decision Making Patient presenting with intermittent abdominal pain over the past 2 to 3 weeks.  Broad differential including colitis, bowel obstruction, pancreatitis, hepatitis diverticulitis, mesenteric ischemia, constipation, mass, food allergy.  Labs and CT imaging are reassuring as outlined below.  Patient does endorse moderate constipation at baseline, we discussed increasing her MiraLAX dosing to twice daily, or dosing required to prevent straining.  I suspect her symptoms are secondary to constipation.  Plan close follow-up  with her primary provider if symptoms persist or worsen.    Amount and/or Complexity of Data Reviewed Labs: ordered.    Details: Urinalysis shows moderate leukocytes c-Met has a borderline elevated ALT at 46, her ALT, alk phos and lipase are normal.  CBC is also normal with a WBC count of 7.0 Radiology: ordered.    Details: CT imaging negative for acute intra-abdominal findings.  She has a stable pancreatic cyst not requiring further imaging.  Significant constipation and diverticulosis without diverticulitis.  Risk Prescription drug management.           Final Clinical Impression(s) / ED Diagnoses Final diagnoses:  Constipation, unspecified constipation type    Rx / DC Orders ED Discharge Orders     None         Victoriano Lain 08/18/22 2136    Vanetta Mulders, MD 08/20/22 1217

## 2022-08-15 NOTE — Telephone Encounter (Signed)
Patient notified per [facility] - Given worsening pain, presence of fever and that her CT scan I ordered is still not for a few more weeks, I would advise she proceed to the ER as she may need more urgent evaluation to rule out appendicitis/diverticulitis.  Patient verbalized understanding and states she will go to [facility] ED.

## 2022-08-15 NOTE — ED Triage Notes (Signed)
Pt reports she has been having intermittent abd pain, diarrhea, and vomiting x 2-3 weeks. Reports she has some outpatient imaging ordered for next week but it started again.

## 2022-08-16 MED ORDER — ONDANSETRON 4 MG PO TBDP
4.0000 mg | ORAL_TABLET | Freq: Once | ORAL | Status: AC
  Administered 2022-08-16: 4 mg via ORAL
  Filled 2022-08-16: qty 1

## 2022-08-16 NOTE — Patient Instructions (Signed)
[name] [name]  08/16/2022     @PREFPERIOPPHARMACY @   Your procedure is scheduled on 08/21/22.  Report to [facility] at 12:30 PM  Call this number if you have problems the morning of surgery:  [phone]  If you experience any cold or flu symptoms such as cough, fever, chills, shortness of breath, etc. between now and your scheduled surgery, please notify us at the above number.   Remember:    Please Follow the diet instructions given to you by Dr Wilburt Finlay office.     Take these medicines the morning of surgery with A SIP OF WATER : Flonase Omeprazole Zoloft    Do not wear jewelry, make-up or nail polish, including gel polish,  artificial nails, or any other type of covering on natural nails (fingers and  toes).  Do not wear lotions, powders, or perfumes, or deodorant.  Do not shave 48 hours prior to surgery.  Men may shave face and neck.  Do not bring valuables to the hospital.  [facility] is not responsible for any belongings or valuables.  Contacts, dentures or bridgework may not be worn into surgery.  Leave your suitcase in the car.  After surgery it may be brought to your room.  For patients admitted to the hospital, discharge time will be determined by your treatment team.  Patients discharged the day of surgery will not be allowed to drive home.   Name and phone number of your driver:   Family Special instructions:  N/A  Please read over the following fact sheets that you were given. Care and Recovery After Surgery  Upper Endoscopy, Adult Upper endoscopy is a procedure to look inside the upper GI (gastrointestinal) tract. The upper GI tract is made up of: The esophagus. This is the part of the body that moves food from your mouth to your stomach. The stomach. The duodenum. This is the first part of your small intestine. This procedure is also called esophagogastroduodenoscopy (EGD) or gastroscopy. In this procedure, your health care provider passes a thin,  flexible tube (endoscope) through your mouth and down your esophagus into your stomach and into your duodenum. A small camera is attached to the end of the tube. Images from the camera appear on a monitor in the exam room. During this procedure, your health care provider may also remove a small piece of tissue to be sent to a lab and examined under a microscope (biopsy). Your health care provider may do an upper endoscopy to diagnose cancers of the upper GI tract. You may also have this procedure to find the cause of other conditions, such as: Stomach pain. Heartburn. Pain or problems when swallowing. Nausea and vomiting. Stomach bleeding. Stomach ulcers. Tell a health care provider about: Any allergies you have. All medicines you are taking, including vitamins, herbs, eye drops, creams, and over-the-counter medicines. Any problems you or family members have had with anesthetic medicines. Any bleeding problems you have. Any surgeries you have had. Any medical conditions you have. Whether you are pregnant or may be pregnant. What are the risks? Your healthcare provider will talk with you about risks. These may include: Infection. Bleeding. Allergic reactions to medicines. A tear or hole (perforation) in the esophagus, stomach, or duodenum. What happens before the procedure? When to stop eating and drinking Follow instructions from your health care provider about what you may eat and drink. These may include: 8 hours before your procedure Stop eating most foods. Do not eat meat, fried  foods, or fatty foods. Eat only light foods, such as toast or crackers. All liquids are okay except energy drinks and alcohol. 6 hours before your procedure Stop eating. Drink only clear liquids, such as water, clear fruit juice, black coffee, plain tea, and sports drinks. Do not drink energy drinks or alcohol. 2 hours before your procedure Stop drinking all liquids. You may be allowed to take medicines  with small sips of water. If you do not follow your health care provider's instructions, your procedure may be delayed or canceled. Medicines Ask your health care provider about: Changing or stopping your regular medicines. This is especially important if you are taking diabetes medicines or blood thinners. Taking medicines such as aspirin and ibuprofen. These medicines can thin your blood. Do not take these medicines unless your health care provider tells you to take them. Taking over-the-counter medicines, vitamins, herbs, and supplements. General instructions If you will be going home right after the procedure, plan to have a responsible adult: Take you home from the hospital or clinic. You will not be allowed to drive. Care for you for the time you are told. What happens during the procedure?  An IV will be inserted into one of your veins. You may be given one or more of the following: A medicine to help you relax (sedative). A medicine to numb the throat (local anesthetic). You will lie on your left side on an exam table. Your health care provider will pass the endoscope through your mouth and down your esophagus. Your health care provider will use the scope to check the inside of your esophagus, stomach, and duodenum. Biopsies may be taken. The endoscope will be removed. The procedure may vary among health care providers and hospitals. What happens after the procedure? Your blood pressure, heart rate, breathing rate, and blood oxygen level will be monitored until you leave the hospital or clinic. When your throat is no longer numb, you may be given some fluids to drink. If you were given a sedative during the procedure, it can affect you for several hours. Do not drive or operate machinery until your health care provider says that it is safe. It is up to you to get the results of your procedure. Ask your health care provider, or the department that is doing the procedure, when your  results will be ready. Contact a health care provider if you: Have a sore throat that lasts longer than 1 day. Have a fever. Get help right away if you: Vomit blood or your vomit looks like coffee grounds. Have bloody, black, or tarry stools. Have a very bad sore throat or you cannot swallow. Have difficulty breathing or very bad pain in your chest or abdomen. These symptoms may be an emergency. Get help right away. Call 911. Do not wait to see if the symptoms will go away. Do not drive yourself to the hospital. Summary Upper endoscopy is a procedure to look inside the upper GI tract. During the procedure, an IV will be inserted into one of your veins. You may be given a medicine to help you relax. The endoscope will be passed through your mouth and down your esophagus. Follow instructions from your health care provider about what you can eat and drink. This information is not intended to replace advice given to you by your health care provider. Make sure you discuss any questions you have with your health care provider. Document Revised: 05/31/2021 Document Reviewed: 05/31/2021 Elsevier Patient Education  2024 [employer]  Monitored Anesthesia Care Anesthesia refers to the techniques, procedures, and medicines that help a person stay safe and comfortable during surgery. Monitored anesthesia care, or sedation, is one type of anesthesia. You may have sedation if you do not need to be asleep for your procedure. Procedures that use sedation may include: Surgery to remove cataracts from your eyes. A dental procedure. A biopsy. This is when a tissue sample is removed and looked at under a microscope. You will be watched closely during your procedure. Your level of sedation or type of anesthesia may be changed to fit your needs. Tell a health care provider about: Any allergies you have. All medicines you are taking, including vitamins, herbs, eye drops, creams, and over-the-counter  medicines. Any problems you or family members have had with anesthesia. Any bleeding problems you have. Any surgeries you have had. Any medical conditions or illnesses you have. This includes sleep apnea, cough, fever, or the flu. Whether you are pregnant or may be pregnant. Whether you use cigarettes, alcohol, or drugs. Any use of steroids, whether by mouth or as a cream. What are the risks? Your health care provider will talk with you about risks. These may include: Getting too much medicine (oversedation). Nausea. Allergic reactions to medicines. Trouble breathing. If this happens, a breathing tube may be used to help you breathe. It will be removed when you are awake and breathing on your own. Heart trouble. Lung trouble. Confusion that gets better with time (emergence delirium). What happens before the procedure? When to stop eating and drinking Follow instructions from your health care provider about what you may eat and drink. These may include: 8 hours before your procedure Stop eating most foods. Do not eat meat, fried foods, or fatty foods. Eat only light foods, such as toast or crackers. All liquids are okay except energy drinks and alcohol. 6 hours before your procedure Stop eating. Drink only clear liquids, such as water, clear fruit juice, black coffee, plain tea, and sports drinks. Do not drink energy drinks or alcohol. 2 hours before your procedure Stop drinking all liquids. You may be allowed to take medicines with small sips of water. If you do not follow your health care provider's instructions, your procedure may be delayed or canceled. Medicines Ask your health care provider about: Changing or stopping your regular medicines. These include any diabetes medicines or blood thinners you take. Taking medicines such as aspirin and ibuprofen. These medicines can thin your blood. Do not take them unless your health care provider tells you to. Taking over-the-counter  medicines, vitamins, herbs, and supplements. Testing You may have an exam or testing. You may have a blood or urine sample taken. General instructions Do not use any products that contain nicotine or tobacco for at least 4 weeks before the procedure. These products include cigarettes, chewing tobacco, and vaping devices, such as e-cigarettes. If you need help quitting, ask your health care provider. If you will be going home right after the procedure, plan to have a responsible adult: Take you home from the hospital or clinic. You will not be allowed to drive. Care for you for the time you are told. What happens during the procedure?  Your blood pressure, heart rate, breathing, level of pain, and blood oxygen level will be monitored. An IV will be inserted into one of your veins. You may be given: A sedative. This helps you relax. Anesthesia. This will: Numb certain areas of your body. Make you fall asleep  for surgery. You will be given medicines as needed to keep you comfortable. The more medicine you are given, the deeper your level of sedation will be. Your level of sedation may be changed to fit your needs. There are three levels of sedation: Mild sedation. At this level, you may feel awake and relaxed. You will be able to follow directions. Moderate sedation. At this level, you will be sleepy. You may not remember the procedure. Deep sedation. At this level, you will be asleep. You will not remember the procedure. How you get the medicines will depend on your age and the procedure. They may be given as: A pill. This may be taken by mouth (orally) or inserted into the rectum. An injection. This may be into a vein or muscle. A spray through the nose. After your procedure is over, the medicine will be stopped. The procedure may vary among health care providers and hospitals. What happens after the procedure? Your blood pressure, heart rate, breathing rate, and blood oxygen level will  be monitored until you leave the hospital or clinic. You may feel sleepy, clumsy, or nauseous. You may not remember what happened during or after the procedure. Sedation can affect you for several hours. Do not drive or use machinery until your health care provider says that it is safe. This information is not intended to replace advice given to you by your health care provider. Make sure you discuss any questions you have with your health care provider. Document Revised: 07/17/2021 Document Reviewed: 07/17/2021 Elsevier Patient Education  2024 [employer].

## 2022-08-16 NOTE — Discharge Instructions (Signed)
Your lab test and CT imaging today are reassuring, however it does appear you have some moderate constipation which may be the source of your abdominal pain symptoms.  I do recommend increasing your MiraLAX, you may need to take 1 dose twice a day, I recommend adjusting this medication until your stools are easy to pass without having to strain.  It is not dangerous to take more than 1 dose of MiraLAX, you may need to adjust the dosing further if you continue to have hard stools.  Continue taking your Zofran if needed for nausea.

## 2022-08-17 ENCOUNTER — Other Ambulatory Visit (HOSPITAL_COMMUNITY)
Admission: RE | Admit: 2022-08-17 | Discharge: 2022-08-17 | Disposition: A | Discharge status: Home / Self Care | Payer: Medicare PPO | Source: Ambulatory Visit | Attending: Gastroenterology | Admitting: Gastroenterology

## 2022-08-17 ENCOUNTER — Encounter (HOSPITAL_COMMUNITY)
Admission: RE | Admit: 2022-08-17 | Discharge: 2022-08-17 | Disposition: A | Discharge status: Home / Self Care | Payer: Medicare PPO | Source: Ambulatory Visit | Attending: Gastroenterology | Admitting: Gastroenterology

## 2022-08-17 VITALS — BP 123/62 | HR 69 | Temp 98.4°F | Resp 18 | Ht 63.0 in | Wt 226.0 lb

## 2022-08-17 DIAGNOSIS — D508 Other iron deficiency anemias: Secondary | ICD-10-CM

## 2022-08-17 DIAGNOSIS — Z01810 Encounter for preprocedural cardiovascular examination: Secondary | ICD-10-CM | POA: Insufficient documentation

## 2022-08-17 DIAGNOSIS — R112 Nausea with vomiting, unspecified: Secondary | ICD-10-CM

## 2022-08-17 DIAGNOSIS — I10 Essential (primary) hypertension: Secondary | ICD-10-CM | POA: Insufficient documentation

## 2022-08-21 ENCOUNTER — Encounter (HOSPITAL_COMMUNITY): Payer: Self-pay | Admitting: Gastroenterology

## 2022-08-21 ENCOUNTER — Ambulatory Visit (HOSPITAL_COMMUNITY): Payer: Medicare PPO | Admitting: Anesthesiology

## 2022-08-21 ENCOUNTER — Encounter (HOSPITAL_COMMUNITY)
Admission: RE | Disposition: A | Discharge status: Home / Self Care | Payer: Self-pay | Source: Home / Self Care | Attending: Gastroenterology

## 2022-08-21 ENCOUNTER — Ambulatory Visit (HOSPITAL_COMMUNITY)
Admission: RE | Admit: 2022-08-21 | Discharge: 2022-08-21 | Disposition: A | Discharge status: Home / Self Care | Payer: Medicare PPO | Attending: Gastroenterology | Admitting: Gastroenterology

## 2022-08-21 ENCOUNTER — Ambulatory Visit (HOSPITAL_BASED_OUTPATIENT_CLINIC_OR_DEPARTMENT_OTHER): Payer: Medicare PPO | Admitting: Anesthesiology

## 2022-08-21 DIAGNOSIS — F32A Depression, unspecified: Secondary | ICD-10-CM | POA: Diagnosis not present

## 2022-08-21 DIAGNOSIS — K2270 Barrett's esophagus without dysplasia: Secondary | ICD-10-CM | POA: Diagnosis not present

## 2022-08-21 DIAGNOSIS — Z9884 Bariatric surgery status: Secondary | ICD-10-CM | POA: Diagnosis not present

## 2022-08-21 DIAGNOSIS — K449 Diaphragmatic hernia without obstruction or gangrene: Secondary | ICD-10-CM | POA: Insufficient documentation

## 2022-08-21 DIAGNOSIS — I10 Essential (primary) hypertension: Secondary | ICD-10-CM

## 2022-08-21 DIAGNOSIS — Z7722 Contact with and (suspected) exposure to environmental tobacco smoke (acute) (chronic): Secondary | ICD-10-CM | POA: Insufficient documentation

## 2022-08-21 DIAGNOSIS — F418 Other specified anxiety disorders: Secondary | ICD-10-CM | POA: Diagnosis not present

## 2022-08-21 DIAGNOSIS — K219 Gastro-esophageal reflux disease without esophagitis: Secondary | ICD-10-CM | POA: Diagnosis not present

## 2022-08-21 DIAGNOSIS — K581 Irritable bowel syndrome with constipation: Secondary | ICD-10-CM | POA: Diagnosis not present

## 2022-08-21 DIAGNOSIS — F419 Anxiety disorder, unspecified: Secondary | ICD-10-CM | POA: Insufficient documentation

## 2022-08-21 DIAGNOSIS — K225 Diverticulum of esophagus, acquired: Secondary | ICD-10-CM

## 2022-08-21 DIAGNOSIS — Q396 Congenital diverticulum of esophagus: Secondary | ICD-10-CM | POA: Insufficient documentation

## 2022-08-21 HISTORY — PX: BIOPSY: SHX5522

## 2022-08-21 HISTORY — PX: ESOPHAGOGASTRODUODENOSCOPY (EGD) WITH PROPOFOL: SHX5813

## 2022-08-21 SURGERY — ESOPHAGOGASTRODUODENOSCOPY (EGD) WITH PROPOFOL
Anesthesia: General

## 2022-08-21 MED ORDER — LACTATED RINGERS IV SOLN: INTRAVENOUS | Status: DC

## 2022-08-21 MED ORDER — PROPOFOL 10 MG/ML IV BOLUS
INTRAVENOUS | Status: DC | PRN
  Administered 2022-08-21: 50 mg via INTRAVENOUS
  Administered 2022-08-21: 110 mg via INTRAVENOUS

## 2022-08-21 MED ORDER — LIDOCAINE HCL (CARDIAC) PF 100 MG/5ML IV SOSY
PREFILLED_SYRINGE | INTRAVENOUS | Status: DC | PRN
  Administered 2022-08-21: 50 mg via INTRAVENOUS

## 2022-08-21 NOTE — Op Note (Signed)
[facility] Patient Name: [name] [name] Procedure Date: 08/21/2022 3:43 PM MRN: [id number] Date of Birth: October 29, 1940 Attending MD: Katrinka Blazing , , [id number] CSN: [id number] Age: 82 Admit Type: Inpatient Procedure:                Upper GI endoscopy Indications:              Follow-up of Barrett's esophagus Providers:                Katrinka Blazing, Sheran Fava, Crystal Page Referring MD:              Medicines:                Monitored Anesthesia Care Complications:            No immediate complications. Estimated Blood Loss:     Estimated blood loss: none. Procedure:                Pre-Anesthesia Assessment:                           - Prior to the procedure, a History and Physical                            was performed, and patient medications, allergies                            and sensitivities were reviewed. The patient's                            tolerance of previous anesthesia was reviewed.                           - The risks and benefits of the procedure and the                            sedation options and risks were discussed with the                            patient. All questions were answered and informed                            consent was obtained.                           After obtaining informed consent, the endoscope was                            passed under direct vision. Throughout the                            procedure, the patient's blood pressure, pulse, and                            oxygen saturations were monitored continuously. The                            [id number]-[id number] ([id number])  scope was introduced through the                            mouth, and advanced to the jejunum. The upper GI                            endoscopy was accomplished without difficulty. The                            patient tolerated the procedure well. Scope In: 3:55:01 PM Scope Out: 4:01:51 PM Total Procedure Duration: 0 hours 6 minutes 50 seconds   Findings:      A non-bleeding diverticulum with a small opening was found in the lower       third of the esophagus.      The esophagus and gastroesophageal junction were examined with white       light and narrow band imaging (NBI). Barrett's esophagus was present.       This involved the mucosa at the upper extent of the gastric folds (42 cm       from the incisors) extending to the Z-line (42 cm from the incisors).       One tongue of salmon-colored mucosa was present from 42 to 43 cm and       islands of salmon-colored mucosa were present. The maximum longitudinal       extent of these esophageal mucosal changes was 1 cm in length. This was       biopsied with a cold forceps for histology.      Evidence of a gastric bypass was found. A gastric pouch with a 4 cm       length from the GE junction to the gastrojejunal anastomosis was found.       The gastrojejunal anastomosis was characterized by healthy appearing       mucosa.      The examined jejunum was normal. Impression:               - Diverticulum in the lower third of the esophagus.                           - Barrett's esophagus. Biopsied.                           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                            Gastrojejunal anastomosis characterized by healthy                            appearing mucosa.                           - Normal examined jejunum (afferent and efferent                            limbs). Moderate Sedation:      Per Anesthesia Care Recommendation:           - Discharge patient to home (ambulatory).                           -  Resume previous diet.                           - Await pathology results.                           - Continue present medications.                           - Continue present medications. Procedure Code(s):        --- Professional ---                           [id number], Esophagogastroduodenoscopy, flexible,                            transoral; with  biopsy, single or multiple Diagnosis Code(s):        --- Professional ---                           Q39.6, Congenital diverticulum of esophagus                           K22.70, Barrett's esophagus without dysplasia                           Z98.84, Bariatric surgery status CPT copyright 2022 [employer]. All rights reserved. The codes documented in this report are preliminary and upon coder review may  be revised to meet current compliance requirements. Katrinka Blazing, MD Katrinka Blazing,  08/21/2022 4:17:04 PM This report has been signed electronically. Number of Addenda: 0

## 2022-08-21 NOTE — H&P (Signed)
[name] [name] is an 82 y.o. female.   Chief Complaint: Barrett's esophagus  HPI: [name] [name] is a 82 y.o. female with past medical history of  IBS-C, IDA, Gerd, Barrett's esophagus, anxiety, depression, HTN, macular degeneration , coming for BE surveillance.   The patient denies having any nausea, vomiting, fever, chills, hematochezia, melena, hematemesis, abdominal distention, abdominal pain, diarrhea, jaundice, pruritus or weight loss.   Past Medical History:  Diagnosis Date   Anemia    Anxiety    Arthritis    Barrett's esophagus    seen on 2016 egd at [facility]   Depression    GERD (gastroesophageal reflux disease)    H/O hiatal hernia    Headache    Hypertension    Macular degeneration    Obesity    Pneumonia    PONV (postoperative nausea and vomiting)    Seasonal allergies    Sepsis (HCC) 10/02/2013    Past Surgical History:  Procedure Laterality Date   ABDOMINAL HYSTERECTOMY     ABDOMINAL SURGERY     APPENDECTOMY     BACK SURGERY     lumbar x2 by Dr Lovell Sheehan   BIOPSY  09/03/2019   Procedure: BIOPSY;  Surgeon: Malissa Hippo, MD;  [facility];  Service: Endoscopy;;  esophagus   CARPAL TUNNEL RELEASE Right 03/2014   Dr. Ophelia Charter   CATARACT EXTRACTION W/PHACO Left 08/06/2013   Procedure: CATARACT EXTRACTION PHACO AND INTRAOCULAR LENS PLACEMENT LEFT EYE;  Surgeon: Gemma Payor, MD;  Location: [facility];  Service: Ophthalmology;  Laterality: Left;  CDE: 9.55   CATARACT EXTRACTION W/PHACO Right 08/24/2013   Procedure: CATARACT EXTRACTION PHACO AND INTRAOCULAR LENS PLACEMENT (IOC);  Surgeon: Gemma Payor, MD;  Location: [facility];  Service: Ophthalmology;  Laterality: Right;  CDE:8.30   CERVICAL FUSION     CHOLECYSTECTOMY     COLONOSCOPY N/A 11/03/2013   SLF: 1. Normal mucosa in the terminal ileum. 2. 13 colon polyps removed. 3. Moderate diverticulosis in the descending colon and sigmoid colon 4. the left colon is redundant 5. Small internal  hemorrhoids.   COLONOSCOPY  N/A 09/25/2017   Procedure: COLONOSCOPY;  Surgeon: Malissa Hippo, MD;  [facility];  Service: Endoscopy;  Laterality: N/A;  1:55   COLONOSCOPY WITH PROPOFOL N/A 04/29/2020   Castaneda: multiple polyps (tubular adenoma, sessile serrated, hyperplastic) ascending colon, transverse colon, descending colon, diverticulsos in sigmoid and descending colon, rectum/anal vergse normal   ESOPHAGOGASTRODUODENOSCOPY N/A 11/03/2013   SLF: 1. 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 Mild Non-erosive gastritis   ESOPHAGOGASTRODUODENOSCOPY  10/2014   [facility]: IMPRESSIONS: - likely small distal esophageal diverticulum without evidence of fistula, +barrett's esophagus without dysplasia. s/p gastric bypass   ESOPHAGOGASTRODUODENOSCOPY (EGD) WITH PROPOFOL N/A 09/03/2019   rehman: normal hypopharynx, esophagus, mid esophagus, diverticulum in distal esophagus, short segment barretts esophagus, gastric bypass with small sized pouch, gastrojejunal anastomosis with healthy appearing mucosa, normal gastric body and jejunum   EYE SURGERY     GASTRIC BYPASS  1979   revision in 1980    HIATAL HERNIA REPAIR     [facility] in 2011   JOINT REPLACEMENT Right 1995   hip   JOINT REPLACEMENT Left 2008   hip   MEDIAL PARTIAL KNEE REPLACEMENT Left    Dr Sherlean Foot   PARAESOPHAGEAL HERNIA REPAIR  SEP 2010 DR. MCNATT   POLYPECTOMY  09/25/2017   Procedure: POLYPECTOMY;  Surgeon: Malissa Hippo, MD;  [facility];  Service: Endoscopy;;  colon   POLYPECTOMY  04/29/2020   Procedure: POLYPECTOMY;  Surgeon: Dolores Frame, MD;  [facility];  Service: Gastroenterology;;   SHOULDER ARTHROSCOPY Right    SHOULDER ARTHROSCOPY Right    x2   SKIN BIOPSY     TONSILLECTOMY     TOTAL KNEE ARTHROPLASTY Right 09/08/2018   Procedure: RIGHT TOTAL KNEE ARTHROPLASTY;  Surgeon: Eldred Manges, MD;  Location: [facility];  Service: Orthopedics;  Laterality:  Right;   TOTAL SHOULDER ARTHROPLASTY Right 02/16/2013   Procedure: TOTAL SHOULDER ARTHROPLASTY;  Surgeon: Eldred Manges, MD;  Location: [facility];  Service: Orthopedics;  Laterality: Right;  Right Total Shoulder Arthroplasty, Cemented   WEIL OSTEOTOMY Right 07/18/2021   Procedure: RIGHT FOOT REVISION WEIL OSTEOTOMY 2ND AND 3RD METATARSAL, GASTROCNEMIUS RECESSION, PROXIMAL INTERPHALANGEAL RESECTION 2ND AND 3RD TOES;  Surgeon: Nadara Mustard, MD;  Location: [facility];  Service: Orthopedics;  Laterality: Right;  regional with monitored anesthesia care    Family History  Problem Relation Age of Onset   Breast cancer Mother    Hypertension Father    Heart disease Father    Hypertension Sister    Colon cancer Brother        IN HIS 60s-METASTATIC   Hypertension Brother    Heart disease Brother    Colon polyps Paternal Grandfather    Social History:  reports that she has never smoked. She has been exposed to tobacco smoke. She has never used smokeless tobacco. She reports that she does not drink alcohol and does not use drugs.  Allergies:  Allergies  Allergen Reactions   Gadavist [Gadobutrol] Itching and Swelling    Per patient    Novocain [Procaine Hcl] Other (See Comments)    Unknown-passed out with novocaine injected for dental surgery.   Other Hives and Other (See Comments)    EKG pads - need to use pediatric pads   Latex Rash   Penicillins Rash and Other (See Comments)    Has patient had a PCN reaction causing immediate rash, facial/tongue/throat swelling, SOB or lightheadedness with hypotension: Yes Has patient had a PCN reaction causing severe rash involving mucus membranes or skin necrosis: No Has patient had a PCN reaction that required hospitalization No Has patient had a PCN reaction occurring within the last 10 years: No  If all of the above answers are "NO", then may proceed with Cephalosporin use.    Sulfa Antibiotics Hives and Rash    Medications Prior to  Admission  Medication Sig Dispense Refill   albuterol (PROVENTIL) (2.5 MG/3ML) 0.083% nebulizer solution Take 3 mLs (2.5 mg total) by nebulization every 4 (four) hours as needed for wheezing. 75 mL 12   alendronate (FOSAMAX) 70 MG tablet Take 70 mg by mouth once a week.     AZO-CRANBERRY PO Take 2 tablets by mouth daily as needed (kidney infections).      Calcium-Phosphorus-Vitamin D (CITRACAL +D3 PO) Take 1 tablet by mouth daily.     Cholecalciferol (VITAMIN D3) 50 MCG (2000 UT) capsule Take 2,000 Units by mouth daily.      docusate sodium (COLACE) 100 MG capsule Take 100 mg by mouth at bedtime as needed for mild constipation.      Ferrous Sulfate (SLOW FE PO) Take 1 tablet by mouth every 7 (seven) days.     fluticasone (FLONASE) 50 MCG/ACT nasal spray Place 2 sprays into both nostrils daily as needed for allergies.  hydrocortisone (ANUSOL-HC) 2.5 % rectal cream Apply 1 Application topically 3 (three) times daily as needed for hemorrhoids or anal itching.     ketoconazole (NIZORAL) 2 % cream Apply 1 Application topically 2 (two) times daily as needed for irritation.     ketoconazole (NIZORAL) 2 % shampoo Apply 1 Application topically every 7 (seven) days.     Lactobacillus (FLORAJEN ACIDOPHILUS) CAPS Take 1 capsule by mouth daily before breakfast.     lisinopril (ZESTRIL) 10 MG tablet Take 10 mg by mouth every morning.     nystatin ointment (MYCOSTATIN) Apply 1 application topically 2 (two) times daily as needed (for irritation).     omeprazole (PRILOSEC) 20 MG capsule Take 1 capsule (20 mg total) by mouth daily. (Patient taking differently: Take 20 mg by mouth in the morning and at bedtime.) 90 capsule 3   ondansetron (ZOFRAN) 4 MG tablet Take 4 mg by mouth every 8 (eight) hours as needed for nausea or vomiting.     sertraline (ZOLOFT) 100 MG tablet Take 100 mg by mouth daily.     simethicone (MYLICON) 125 MG chewable tablet Chew 125 mg by mouth every 6 (six) hours as needed for flatulence.      traZODone (DESYREL) 50 MG tablet Take 50 mg by mouth at bedtime.     acetaminophen (TYLENOL) 500 MG tablet Take 500-1,000 mg by mouth every 6 (six) hours as needed for moderate pain or headache.     conjugated estrogens (PREMARIN) vaginal cream Place 1 Applicatorful vaginally at bedtime. Use as directed at bedtime (Patient not taking: Reported on 07/03/2022) 30 g 12   Nystatin (GERHARDT'S BUTT CREAM) CREA Apply 1 application topically 3 (three) times daily as needed for irritation. (Patient not taking: Reported on 08/15/2022) 1 each 11    No results found for this or any previous visit (from the past 48 hour(s)). No results found.  Review of Systems  All other systems reviewed and are negative.   Blood pressure (!) 173/77, pulse 73, temperature 98.3 F (36.8 C), resp. rate 14, SpO2 97 %. Physical Exam  GENERAL: The patient is AO x3, in no acute distress. HEENT: Head is normocephalic and atraumatic. EOMI are intact. Mouth is well hydrated and without lesions. NECK: Supple. No masses LUNGS: Clear to auscultation. No presence of rhonchi/wheezing/rales. Adequate chest expansion HEART: RRR, normal s1 and s2. ABDOMEN: Soft, nontender, no guarding, no peritoneal signs, and nondistended. BS +. No masses. EXTREMITIES: Without any cyanosis, clubbing, rash, lesions or edema. NEUROLOGIC: AOx3, no focal motor deficit. SKIN: no jaundice, no rashes  Assessment/Plan  [name] [name] is a 82 y.o. female with past medical history of  IBS-C, IDA, Gerd, Barrett's esophagus, anxiety, depression, HTN, macular degeneration , coming for BE surveillance.  Will proceed with EGD.  Dolores Frame, MD 08/21/2022, 2:33 PM

## 2022-08-21 NOTE — Transfer of Care (Signed)
Immediate Anesthesia Transfer of Care Note  Patient: [name] [name]  Procedure(s) Performed: ESOPHAGOGASTRODUODENOSCOPY (EGD) WITH PROPOFOL BIOPSY  Patient Location: Short Stay  Anesthesia Type:General  Level of Consciousness: awake and alert   Airway & Oxygen Therapy: Patient Spontanous Breathing  Post-op Assessment: Report given to RN and Post -op Vital signs reviewed and stable  Post vital signs: Reviewed and stable  Last Vitals:  Vitals Value Taken Time  BP 135/64 08/21/22 1605  Temp 37.1 C 08/21/22 1605  Pulse 79 08/21/22 1605  Resp 22 08/21/22 1605  SpO2 98 % 08/21/22 1605    Last Pain:  Vitals:   08/21/22 1605  TempSrc: Oral  PainSc: 0-No pain         Complications: No notable events documented.

## 2022-08-21 NOTE — Discharge Instructions (Signed)
You are being discharged to home.  ?Resume your previous diet.  ?We are waiting for your pathology results.  ?Continue your present medications.  ?

## 2022-08-21 NOTE — Anesthesia Procedure Notes (Signed)
Date/Time: 08/21/2022 3:47 PM  Performed by: Franco Nones, CRNAPre-anesthesia Checklist: Patient identified, Emergency Drugs available, Suction available, Timeout performed and Patient being monitored Patient Re-evaluated:Patient Re-evaluated prior to induction Oxygen Delivery Method: Nasal Cannula

## 2022-08-21 NOTE — Anesthesia Preprocedure Evaluation (Signed)
Anesthesia Evaluation  Patient identified by MRN, date of birth, ID band Patient awake    Reviewed: Allergy & Precautions, H&P , NPO status , Patient's Chart, lab work & pertinent test results  History of Anesthesia Complications (+) PONV and history of anesthetic complications  Airway Mallampati: II  TM Distance: >3 FB Neck ROM: Full   Comment: Cervical fusion Dental  (+) Edentulous Upper, Edentulous Lower   Pulmonary pneumonia   Pulmonary exam normal breath sounds clear to auscultation       Cardiovascular hypertension, Pt. on medications  Rhythm:Regular Rate:Normal + Systolic murmurs [id number] 09:58:21 [facility] ROUTINE RECORD 01/13/1941 (81 yr) Female Caucasian Vent. rate 69 BPM PR interval 138 ms QRS duration 90 ms QT/QTcB 392/420 ms P-R-T axes 60 -23 56 Normal sinus rhythm Minimal voltage criteria for LVH, may be normal variant ( R in aVL ) Borderline ECG When compared with ECG of 29-Dec-2021 12:18, Rate slower PREVIOUS ECG IS PRESENT Confirmed by Carylon Perches [phone]) on 08/18/2022 9:21:56 AM   Neuro/Psych  Headaches PSYCHIATRIC DISORDERS Anxiety Depression       GI/Hepatic Neg liver ROS, hiatal hernia,GERD  Medicated,,  Endo/Other  negative endocrine ROS    Renal/GU Renal disease  negative genitourinary   Musculoskeletal  (+) Arthritis , Osteoarthritis,    Abdominal   Peds negative pediatric ROS (+)  Hematology  (+) Blood dyscrasia, anemia   Anesthesia Other Findings   Reproductive/Obstetrics negative OB ROS                             Anesthesia Physical Anesthesia Plan  ASA: 3  Anesthesia Plan: General   Post-op Pain Management: Minimal or no pain anticipated   Induction: Intravenous  PONV Risk Score and Plan: 0 and Propofol infusion  Airway Management Planned: Nasal Cannula and Natural Airway  Additional Equipment:   Intra-op Plan:    Post-operative Plan:   Informed Consent: I have reviewed the patients History and Physical, chart, labs and discussed the procedure including the risks, benefits and alternatives for the proposed anesthesia with the patient or authorized representative who has indicated his/her understanding and acceptance.     Dental advisory given  Plan Discussed with: CRNA  Anesthesia Plan Comments:         Anesthesia Quick Evaluation

## 2022-08-21 NOTE — Anesthesia Postprocedure Evaluation (Signed)
Anesthesia Post Note  Patient: [name] [name]  Procedure(s) Performed: ESOPHAGOGASTRODUODENOSCOPY (EGD) WITH PROPOFOL BIOPSY  Patient location during evaluation: Phase II Anesthesia Type: General Level of consciousness: awake and alert and oriented Pain management: pain level controlled Vital Signs Assessment: post-procedure vital signs reviewed and stable Respiratory status: spontaneous breathing, nonlabored ventilation and respiratory function stable Cardiovascular status: blood pressure returned to baseline and stable Postop Assessment: no apparent nausea or vomiting Anesthetic complications: no  No notable events documented.   Last Vitals:  Vitals:   08/21/22 1330 08/21/22 1605  BP: (!) 173/77 135/64  Pulse: 73 79  Resp: 14 (!) 22  Temp:  37.1 C  SpO2: 97% 98%    Last Pain:  Vitals:   08/21/22 1605  TempSrc: Oral  PainSc: 0-No pain                 [name] [name]

## 2022-08-23 DIAGNOSIS — Z981 Arthrodesis status: Secondary | ICD-10-CM | POA: Diagnosis not present

## 2022-08-23 DIAGNOSIS — N1831 Chronic kidney disease, stage 3a: Secondary | ICD-10-CM | POA: Diagnosis not present

## 2022-08-23 DIAGNOSIS — K2270 Barrett's esophagus without dysplasia: Secondary | ICD-10-CM | POA: Diagnosis not present

## 2022-08-23 DIAGNOSIS — M159 Polyosteoarthritis, unspecified: Secondary | ICD-10-CM | POA: Diagnosis not present

## 2022-08-23 DIAGNOSIS — Q396 Congenital diverticulum of esophagus: Secondary | ICD-10-CM | POA: Diagnosis not present

## 2022-08-23 DIAGNOSIS — F339 Major depressive disorder, recurrent, unspecified: Secondary | ICD-10-CM | POA: Diagnosis not present

## 2022-08-23 DIAGNOSIS — Z0001 Encounter for general adult medical examination with abnormal findings: Secondary | ICD-10-CM | POA: Diagnosis not present

## 2022-08-23 DIAGNOSIS — I5032 Chronic diastolic (congestive) heart failure: Secondary | ICD-10-CM | POA: Diagnosis not present

## 2022-08-23 DIAGNOSIS — D696 Thrombocytopenia, unspecified: Secondary | ICD-10-CM | POA: Diagnosis not present

## 2022-08-23 LAB — SURGICAL PATHOLOGY

## 2022-08-27 ENCOUNTER — Encounter (HOSPITAL_COMMUNITY): Payer: Self-pay | Admitting: Gastroenterology

## 2022-08-27 ENCOUNTER — Telehealth (INDEPENDENT_AMBULATORY_CARE_PROVIDER_SITE_OTHER): Payer: Self-pay | Admitting: Gastroenterology

## 2022-08-27 NOTE — Telephone Encounter (Signed)
PA for CT redone since one from April had expired.  [employer] [phone] Will email this to pre service.  Pt has appt for 08/28/22

## 2022-08-28 ENCOUNTER — Ambulatory Visit (HOSPITAL_COMMUNITY): Payer: Medicare PPO

## 2022-08-31 DIAGNOSIS — Z6839 Body mass index (BMI) 39.0-39.9, adult: Secondary | ICD-10-CM | POA: Diagnosis not present

## 2022-08-31 DIAGNOSIS — M5416 Radiculopathy, lumbar region: Secondary | ICD-10-CM | POA: Diagnosis not present

## 2022-08-31 DIAGNOSIS — M47812 Spondylosis without myelopathy or radiculopathy, cervical region: Secondary | ICD-10-CM | POA: Diagnosis not present

## 2022-09-03 DIAGNOSIS — H353112 Nonexudative age-related macular degeneration, right eye, intermediate dry stage: Secondary | ICD-10-CM | POA: Diagnosis not present

## 2022-09-03 DIAGNOSIS — H524 Presbyopia: Secondary | ICD-10-CM | POA: Diagnosis not present

## 2022-09-03 DIAGNOSIS — H353221 Exudative age-related macular degeneration, left eye, with active choroidal neovascularization: Secondary | ICD-10-CM | POA: Diagnosis not present

## 2022-09-11 DIAGNOSIS — H353231 Exudative age-related macular degeneration, bilateral, with active choroidal neovascularization: Secondary | ICD-10-CM | POA: Diagnosis not present

## 2022-09-11 DIAGNOSIS — H43813 Vitreous degeneration, bilateral: Secondary | ICD-10-CM | POA: Diagnosis not present

## 2022-09-11 DIAGNOSIS — H35433 Paving stone degeneration of retina, bilateral: Secondary | ICD-10-CM | POA: Diagnosis not present

## 2022-09-19 DIAGNOSIS — M47812 Spondylosis without myelopathy or radiculopathy, cervical region: Secondary | ICD-10-CM | POA: Diagnosis not present

## 2022-10-22 ENCOUNTER — Encounter (INDEPENDENT_AMBULATORY_CARE_PROVIDER_SITE_OTHER): Payer: Self-pay | Admitting: Gastroenterology

## 2022-10-22 ENCOUNTER — Ambulatory Visit (INDEPENDENT_AMBULATORY_CARE_PROVIDER_SITE_OTHER): Payer: Medicare PPO | Admitting: Gastroenterology

## 2022-10-22 ENCOUNTER — Telehealth: Payer: Self-pay | Admitting: *Deleted

## 2022-10-22 VITALS — BP 180/85 | HR 65 | Temp 98.5°F | Ht 63.0 in | Wt 220.7 lb

## 2022-10-22 DIAGNOSIS — R6881 Early satiety: Secondary | ICD-10-CM | POA: Diagnosis not present

## 2022-10-22 DIAGNOSIS — R1114 Bilious vomiting: Secondary | ICD-10-CM | POA: Diagnosis not present

## 2022-10-22 DIAGNOSIS — K219 Gastro-esophageal reflux disease without esophagitis: Secondary | ICD-10-CM | POA: Diagnosis not present

## 2022-10-22 DIAGNOSIS — K649 Unspecified hemorrhoids: Secondary | ICD-10-CM | POA: Diagnosis not present

## 2022-10-22 DIAGNOSIS — K5900 Constipation, unspecified: Secondary | ICD-10-CM

## 2022-10-22 NOTE — Patient Instructions (Signed)
Please continue with omeprazole 20mg  twice daily Be mindful of greasy, spicy, fried, citrus foods, caffeine, carbonated drinks, and chocolate as these can increase reflux symptoms Stay upright 2-3 hours after eating, prior to lying down and avoid eating late in the evenings.  I will get you set up for a gastric emptying study to further evaluate nausea and sensation of food sitting in your stomach, you will need to avoid taking any nausea meds (zofran) 1 week prior to the exam to ensure accuracy.   As discussed, you can try over the counter xyzal for nasal drainage once you finish the claritin you have on hand as I suspect the yellow you are seeing at times in your throat is post nasal drainage  Please continue with [facility] hemorrhoid cream 3-4 times per day, we will call in a refill, you can utilize a donut pillow to take pressure off of your hemorrhoids when sitting  Continue with miralax and stool softener as you are doing  Follow up 6 months  It was a pleasure to see you today. I want to create trusting relationships with patients and provide genuine, compassionate, and quality care. I truly value your feedback! please be on the lookout for a survey regarding your visit with me today. I appreciate your input about our visit and your time in completing this!    Tammee Thielke L. Jeanmarie Hubert, MSN, APRN, AGNP-C Adult-Gerontology Nurse Practitioner [facility] at [facility]

## 2022-10-22 NOTE — Telephone Encounter (Signed)
LMTRC  Pt has a lot of appointments already scheduled and need to find out when would be a good time to schedule GES.

## 2022-10-22 NOTE — Progress Notes (Addendum)
Referring Provider: Juliette Alcide, MD Primary Care Physician:  Juliette Alcide, MD Primary GI Physician: Dr. Levon Hedger  Chief Complaint  Patient presents with   Constipation    Follow up on constipation. States doing better currently. Does goes back and forth with constipation and diarrhea. Takes stool softners.    Gastroesophageal Reflux    Follow up on GERD. Feels like food stops in stomach and hard to go down. Sometimes food is hard to chew and swallow. Started 3 -4 years ago. Wonders if it is side effect of fosamax. She read fosamax could cause lock jaw. Concerned because inside of mouth is yellow. Concerned it may be cancer.    Hemorrhoids    Follow up on hemorrhoids. Doing [place] apoth cream three times per day instead of 4 because it is hard for her to bend over to use cream.    HPI:   [name] [name] is a 82 y.o. female with past medical history of IBS-C, IDA, Gerd, Barrett's esophagus, anxiety, depression, HTN, macular degeneration   Patient presenting today for follow up of IBS-M, GERD, hemorrhoids, early satiety and nausea   Last seen April 2024, at that time, feeling foods not digesting well, sitting in her stomach. Has some LLQ pain, intermittent nausea, vomiting. Having a BM a few times per week doing, 1/2-1 capful miralax per day, stools hard. Taking omeprazole 20mg  daily and having no breakthrough GERD, occasional dysphagia. Some rectal bleeding/smear on tissue, reported hemorrhoids that were irritated/painful when sitting.    Recommended to start miralax, [place] hemorrhoid cream, beano/gas x for flatulence, continue omeprazole 20mg  daily, daily probiotic, schedule CT A/P with contrast  Present:  She states that food feels like it stops in her LUQ and sometimes can feel that it takes up to the next day for it to pass. She has had some nausea and vomited up some bile. She takes zofran PRN but notes she is not taking it daily.   She notes she has had some intermittent  diarrhea. She may not have it at all some weeks. She reports 3 days of diarrhea last week. Stools can be watery, She also has intermittent constipation, with very hard stools, taking miralax 1-3 capsfuls per day and 2 stool softeners daily, increases to three if she has not had a BM in a few days. She is trying to avoid certain foods especially things that tend to cause her more gas. Feels that diarrhea usually occurs after increasing stool softeners/miralax once constipation begins.  Hemorrhoids are some better, she is using [place] hemorrhoid cream TID. Still has some pain with sitting. No rectal bleeding.   Occasionally having some acid regurgitation if she eats something like pizza. Though she does note she eats tomato almost daily. She is taking omeprazole 20mg  BID.   She notes she feels that the inside of her mouth, near the back is yellow at times, this has been present for the past few years, she has some issues with opening her mouth at times like her jaw locks up. She wonders if the fosamax is causing these symptoms. She has a scratchy throat at times too, she is on claritin and taking mucinex which seem to help some.   CT A/P with contrast:  No acute findings in the abdomen or pelvis to explain the patient's symptoms. 2. Constipation and diverticulosis. 3. Aortic atherosclerosis. 4. Aortic valve calcifications. Echocardiography may be helpful if not previously performed. 5. Osteopenia and degenerative change. 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 6. 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 No further imaging follow-up recommended. Last Colonoscopy:2/2022Two 3 to 5 mm polyps in the cecum,  - Three 3 to 6 mm polyps in the ascending colon,  - Four 4 to 8 mm polyps in the transverse colon,  - Seven 3 to 9 mm polyps in the descending colon,  - Diverticulosis in the sigmoid colon and in the descending colon. - The distal rectum and  anal verge are normal on retroflexion view. (Tubular adenomas, sessile serrated polyps, hyperplastic polyps) no repeat recommended  Last Endoscopy: 08/2022 - Diverticulum in the lower third of the esophagus.                           - Barrett's esophagus. Biopsied BE without dysplasia                            - Gastric bypass with a pouch 4 cm in length.                            Gastrojejunal anastomosis characterized by healthy                            appearing mucosa.                           - Normal examined jejunum (afferent and efferent                            limbs).  Recommendations:  Repeat EGD in 4 years if medically fit   Past Medical History:  Diagnosis Date   Anemia    Anxiety    Arthritis    Barrett's esophagus    seen on 2016 egd at [facility]   Depression    GERD (gastroesophageal reflux disease)    H/O hiatal hernia    Headache    Hypertension    Macular degeneration    Obesity    Pneumonia    PONV (postoperative nausea and vomiting)    Seasonal allergies    Sepsis (HCC) 10/02/2013    Past Surgical History:  Procedure Laterality Date   ABDOMINAL HYSTERECTOMY     ABDOMINAL SURGERY     APPENDECTOMY     BACK SURGERY     lumbar x2 by Dr Lovell Sheehan   BIOPSY  09/03/2019   Procedure: BIOPSY;  Surgeon: Malissa Hippo, MD;  [facility];  Service: Endoscopy;;  esophagus   BIOPSY  08/21/2022   Procedure: BIOPSY;  Surgeon: Dolores Frame, MD;  [facility];  Service: Gastroenterology;;   CARPAL TUNNEL RELEASE Right 03/2014   Dr. Ophelia Charter   CATARACT EXTRACTION W/PHACO Left 08/06/2013   Procedure: CATARACT EXTRACTION PHACO AND INTRAOCULAR LENS PLACEMENT LEFT EYE;  Surgeon: Gemma Payor, MD;  Location: [facility];  Service: Ophthalmology;  Laterality: Left;  CDE: 9.55   CATARACT EXTRACTION W/PHACO Right 08/24/2013   Procedure: CATARACT EXTRACTION PHACO AND INTRAOCULAR LENS PLACEMENT (IOC);  Surgeon: Gemma Payor, MD;  Location: [facility];   Service: Ophthalmology;  Laterality: Right;  CDE:8.30   CERVICAL FUSION     CHOLECYSTECTOMY     COLONOSCOPY N/A 11/03/2013   SLF: 1. Normal mucosa in  the terminal ileum. 2. 13 colon polyps removed. 3. Moderate diverticulosis in the descending colon and sigmoid colon 4. the left colon is redundant 5. Small internal  hemorrhoids.   COLONOSCOPY N/A 09/25/2017   Procedure: COLONOSCOPY;  Surgeon: Malissa Hippo, MD;  [facility];  Service: Endoscopy;  Laterality: N/A;  1:55   COLONOSCOPY WITH PROPOFOL N/A 04/29/2020   Castaneda: multiple polyps (tubular adenoma, sessile serrated, hyperplastic) ascending colon, transverse colon, descending colon, diverticulsos in sigmoid and descending colon, rectum/anal vergse normal   ESOPHAGOGASTRODUODENOSCOPY N/A 11/03/2013   SLF: 1. 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 Mild Non-erosive gastritis   ESOPHAGOGASTRODUODENOSCOPY  10/2014   [facility]: IMPRESSIONS: - likely small distal esophageal diverticulum without evidence of fistula, +barrett's esophagus without dysplasia. s/p gastric bypass   ESOPHAGOGASTRODUODENOSCOPY (EGD) WITH PROPOFOL N/A 09/03/2019   rehman: normal hypopharynx, esophagus, mid esophagus, diverticulum in distal esophagus, short segment barretts esophagus, gastric bypass with small sized pouch, gastrojejunal anastomosis with healthy appearing mucosa, normal gastric body and jejunum   ESOPHAGOGASTRODUODENOSCOPY (EGD) WITH PROPOFOL N/A 08/21/2022   Procedure: ESOPHAGOGASTRODUODENOSCOPY (EGD) WITH PROPOFOL;  Surgeon: Dolores Frame, MD;  [facility];  Service: Gastroenterology;  Laterality: N/A;  2:30am;asa 3   EYE SURGERY     GASTRIC BYPASS  1979   revision in 1980    HIATAL HERNIA REPAIR     [facility] in 2011   JOINT REPLACEMENT Right 1995   hip   JOINT REPLACEMENT Left 2008   hip   MEDIAL PARTIAL KNEE REPLACEMENT Left    Dr Sherlean Foot   PARAESOPHAGEAL  HERNIA REPAIR  SEP 2010 DR. MCNATT   POLYPECTOMY  09/25/2017   Procedure: POLYPECTOMY;  Surgeon: Malissa Hippo, MD;  [facility];  Service: Endoscopy;;  colon   POLYPECTOMY  04/29/2020   Procedure: POLYPECTOMY;  Surgeon: Dolores Frame, MD;  [facility];  Service: Gastroenterology;;   SHOULDER ARTHROSCOPY Right    SHOULDER ARTHROSCOPY Right    x2   SKIN BIOPSY     TONSILLECTOMY     TOTAL KNEE ARTHROPLASTY Right 09/08/2018   Procedure: RIGHT TOTAL KNEE ARTHROPLASTY;  Surgeon: Eldred Manges, MD;  Location: [facility];  Service: Orthopedics;  Laterality: Right;   TOTAL SHOULDER ARTHROPLASTY Right 02/16/2013   Procedure: TOTAL SHOULDER ARTHROPLASTY;  Surgeon: Eldred Manges, MD;  Location: [facility];  Service: Orthopedics;  Laterality: Right;  Right Total Shoulder Arthroplasty, Cemented   WEIL OSTEOTOMY Right 07/18/2021   Procedure: RIGHT FOOT REVISION WEIL OSTEOTOMY 2ND AND 3RD METATARSAL, GASTROCNEMIUS RECESSION, PROXIMAL INTERPHALANGEAL RESECTION 2ND AND 3RD TOES;  Surgeon: Nadara Mustard, MD;  Location: [facility];  Service: Orthopedics;  Laterality: Right;  regional with monitored anesthesia care    Current Outpatient Medications  Medication Sig Dispense Refill   acetaminophen (TYLENOL) 500 MG tablet Take 500-1,000 mg by mouth every 6 (six) hours as needed for moderate pain or headache.     albuterol (PROVENTIL) (2.5 MG/3ML) 0.083% nebulizer solution Take 3 mLs (2.5 mg total) by nebulization every 4 (four) hours as needed for wheezing. 75 mL 12   alendronate (FOSAMAX) 70 MG tablet Take 70 mg by mouth once a week.     AZO-CRANBERRY PO Take 2 tablets by mouth daily as needed (kidney infections).      Calcium-Phosphorus-Vitamin D (CITRACAL +D3 PO) Take 1 tablet by mouth daily.     Cholecalciferol (VITAMIN D3) 50 MCG (  2000 UT) capsule Take 2,000 Units by mouth daily.      docusate sodium (COLACE) 100 MG capsule Take 100 mg by mouth at bedtime as needed  for mild constipation.      Ferrous Sulfate (SLOW FE PO) Take 1 tablet by mouth every 7 (seven) days.     fluticasone (FLONASE) 50 MCG/ACT nasal spray Place 2 sprays into both nostrils daily as needed for allergies.     ketoconazole (NIZORAL) 2 % cream Apply 1 Application topically 2 (two) times daily as needed for irritation.     ketoconazole (NIZORAL) 2 % shampoo Apply 1 Application topically every 7 (seven) days.     Lactobacillus (FLORAJEN ACIDOPHILUS) CAPS Take 1 capsule by mouth daily before breakfast.     lisinopril (ZESTRIL) 10 MG tablet Take 10 mg by mouth every morning.     NONFORMULARY OR COMPOUNDED ITEM [place] [facility] hemorrhoid cream     Nystatin (GERHARDT'S BUTT CREAM) CREA Apply 1 application topically 3 (three) times daily as needed for irritation. 1 each 11   nystatin ointment (MYCOSTATIN) Apply 1 application topically 2 (two) times daily as needed (for irritation).     omeprazole (PRILOSEC) 20 MG capsule Take 1 capsule (20 mg total) by mouth daily. (Patient taking differently: Take 20 mg by mouth in the morning and at bedtime.) 90 capsule 3   ondansetron (ZOFRAN) 4 MG tablet Take 4 mg by mouth every 8 (eight) hours as needed for nausea or vomiting.     sertraline (ZOLOFT) 100 MG tablet Take 100 mg by mouth daily.     simethicone (MYLICON) 125 MG chewable tablet Chew 125 mg by mouth every 6 (six) hours as needed for flatulence.     traZODone (DESYREL) 50 MG tablet Take 50 mg by mouth at bedtime.     conjugated estrogens (PREMARIN) vaginal cream Place 1 Applicatorful vaginally at bedtime. Use as directed at bedtime (Patient not taking: Reported on 07/03/2022) 30 g 12   No current facility-administered medications for this visit.    Allergies as of 10/22/2022 - Review Complete 10/22/2022  Allergen Reaction Noted   Gadavist [gadobutrol] Itching and Swelling 07/05/2021   Novocain [procaine hcl] Other (See Comments) 11/20/2011   Other Hives and Other (See Comments) 11/20/2011    Latex Rash 07/22/2013   Penicillins Rash and Other (See Comments) 11/20/2011   Sulfa antibiotics Hives and Rash 11/20/2011    Family History  Problem Relation Age of Onset   Breast cancer Mother    Hypertension Father    Heart disease Father    Hypertension Sister    Colon cancer Brother        IN HIS 60s-METASTATIC   Hypertension Brother    Heart disease Brother    Colon polyps Paternal Grandfather     Social History   Socioeconomic History   Marital status: Single    Spouse name: Not on file   Number of children: 0   Years of education: Not on file   Highest education level: Some college, no degree  Occupational History   Not on file  Tobacco Use   Smoking status: Never    Passive exposure: Past   Smokeless tobacco: Never  Vaping Use   Vaping status: Never Used  Substance and Sexual Activity   Alcohol use: No   Drug use: No   Sexual activity: Yes    Birth control/protection: Surgical  Other Topics Concern   Not on file  Social History Narrative   12/26/21 lives alone  Social Determinants of Health   Financial Resource Strain: Not on file  Food Insecurity: No Food Insecurity (12/29/2021)   Hunger Vital Sign    Worried About Running Out of Food in the Last Year: Never true    Ran Out of Food in the Last Year: Never true  Transportation Needs: No Transportation Needs (12/30/2021)   PRAPARE - Administrator, Civil Service (Medical): No    Lack of Transportation (Non-Medical): No  Physical Activity: Not on file  Stress: Not on file  Social Connections: Not on file    Review of systems General: negative for malaise, night sweats, fever, chills, weight los Neck: Negative for lumps, goiter, pain and significant neck swelling Resp: Negative for cough, wheezing, dyspnea at rest CV: Negative for chest pain, leg swelling, palpitations, orthopnea GI: denies melena, hematochezia, nausea, vomiting, diarrhea, constipation, dysphagia, odyonophagia, early  satiety or unintentional weight loss.  MSK: Negative for joint pain or swelling, back pain, and muscle pain. Derm: Negative for itching or rash Psych: Denies depression, anxiety, memory loss, confusion. No homicidal or suicidal ideation.  Heme: Negative for prolonged bleeding, bruising easily, and swollen nodes. Endocrine: Negative for cold or heat intolerance, polyuria, polydipsia and goiter. Neuro: negative for tremor, gait imbalance, syncope and seizures. The remainder of the review of systems is noncontributory.  Physical Exam: BP (!) 180/85   Pulse 65   Temp 98.5 F (36.9 C) (Oral)   Ht 5\' 3"  (1.6 m)   Wt 220 lb 11.2 oz (100.1 kg)   BMI 39.10 kg/m  General:   Alert and oriented. No distress noted. Pleasant and cooperative.  Head:  Normocephalic and atraumatic. Eyes:  Conjuctiva clear without scleral icterus. Mouth/throat:  Oral mucosa pink and moist. Good dentition. No lesions. No yellow coloration or lesions present, no cobblestoning or obvious post nasal drainage on exam.  Heart: Normal rate and rhythm, s1 and s2 heart sounds present.  Lungs: Clear lung sounds in all lobes. Respirations equal and unlabored. Abdomen:  +BS, soft, non-tender and non-distended. No rebound or guarding. No HSM or masses noted. Derm: No palmar erythema or jaundice Msk:  Symmetrical without gross deformities. Normal posture. Extremities:  Without edema. Neurologic:  Alert and  oriented x4 Psych:  Alert and cooperative. Normal mood and affect.  Invalid input(s): "6 MONTHS"   ASSESSMENT: PERLE FISHMAN is a 82 y.o. female presenting today for follow up of nausea, early satiety, GERD, IBS-M and hemorrhoids   GERD: on omeprazole 20mg  BID, has occasional breakthrough if she eats something such as pizza, though does well otherwise. Will continue with current PPI regimen, instructed on good reflux precautions   IBS-M: continues to have intermittent diarrhea/constipation. Diarrhea is usually precipitated  with episode of constipation for which she increases her miralax/stool softener for. No rectal bleeding or melena. CT A/P in June was suggestive of constipation with stool present in colon, recommend continuing with current bowel regimen, titrating her miralax as needed.   Hemorrhoids: improved with use of [place] [facility] cream. No further bleeding but still with some pain, recommend she utilize a donut pillow to sit on to take pressure off of hemorrhoids as she notes sitting causes her the most pain, will send refill of [employer] hemorrhoid cream for her to use 3-4x/day.   Early satiety/nausea/LUQ discomfort:  continues to have some LUQ discomfort, nausea, some vomiting of bile on occasion and feeling that foods sit in her stomach for long periods. She notes some early satiety at times.  Recent EGD as above without findings to explain her symptoms. Recommend proceeding with GES to rule out gastroparesis, will need to hold zofran 7 days prior to the exam.   Throat irritation/yellow substance in back of mouth/throat:  she reports a yellow substance in the back of her mouth/throat on occasion for the past few years, has history of sinus issues/PND. No abnormal coloring or lesions on exam today. Query if she is seeing some post nasal drainage when she notes yellowing, she Is on claritin and takes mucinex which helps some. Advised she can try otc xyzal once finished with claritin as this is a bit stronger and may improver her symptoms a little more.   PLAN:  Schedule GES-hold zofran x7 days prior  2. Continue omeprazole 20mg  BID, good reflux precautions  3. Continue with [employer] TID-QID (refill)  4.  Continue with miralax/stool softeners, titrating as needed  5. Start otc xyzal for post nasal drainage   All questions were answered, patient verbalized understanding and is in agreement with plan as outlined above.   Follow Up: 6 months   [name] Altidor L. Jeanmarie Hubert, MSN, APRN,  AGNP-C Adult-Gerontology Nurse Practitioner [facility]  I have reviewed the note and agree with the APP's assessment as described in this progress note  Katrinka Blazing, MD Gastroenterology and Hepatology [facility] Gastroenterology

## 2022-10-23 NOTE — Telephone Encounter (Signed)
Pt left vm returning call  LMTRC 

## 2022-10-25 ENCOUNTER — Telehealth (INDEPENDENT_AMBULATORY_CARE_PROVIDER_SITE_OTHER): Payer: Self-pay | Admitting: *Deleted

## 2022-10-25 NOTE — Telephone Encounter (Signed)
Pt called to clarify which allergy med she was suppose to switch to when she finished claritin.   I discussed with patient per chelsea's note - you can try over the counter xyzal for nasal drainage once you finish the claritin you have on hand as I suspect the yellow you are seeing at times in your throat is post nasal drainage   Pt verbalized understanding.

## 2022-10-30 NOTE — Telephone Encounter (Signed)
LMTRC

## 2022-11-06 DIAGNOSIS — H43813 Vitreous degeneration, bilateral: Secondary | ICD-10-CM | POA: Diagnosis not present

## 2022-11-06 DIAGNOSIS — H35433 Paving stone degeneration of retina, bilateral: Secondary | ICD-10-CM | POA: Diagnosis not present

## 2022-11-06 DIAGNOSIS — H353231 Exudative age-related macular degeneration, bilateral, with active choroidal neovascularization: Secondary | ICD-10-CM | POA: Diagnosis not present

## 2022-11-07 ENCOUNTER — Encounter: Payer: Self-pay | Admitting: *Deleted

## 2022-11-07 NOTE — Telephone Encounter (Signed)
Mailed letter °

## 2022-11-12 NOTE — Telephone Encounter (Signed)
Pt informed that GES is scheduled for 11/16/22, arrive at 9:45 am to check in,NPO after midnight and no stomach medicines that morning of. Pt was also given number to nuclear medicine if she needed to reschedule.

## 2022-11-16 ENCOUNTER — Encounter (HOSPITAL_COMMUNITY)
Admission: RE | Admit: 2022-11-16 | Discharge: 2022-11-16 | Disposition: A | Discharge status: Home / Self Care | Payer: Medicare PPO | Source: Ambulatory Visit | Attending: Gastroenterology | Admitting: Gastroenterology

## 2022-11-16 DIAGNOSIS — R6881 Early satiety: Secondary | ICD-10-CM | POA: Insufficient documentation

## 2022-11-16 DIAGNOSIS — R1114 Bilious vomiting: Secondary | ICD-10-CM | POA: Insufficient documentation

## 2022-11-16 MED ORDER — TECHNETIUM TC 99M SULFUR COLLOID
2.0000 | Freq: Once | INTRAVENOUS | Status: AC | PRN
  Administered 2022-11-16: 2 via ORAL

## 2022-11-19 DIAGNOSIS — I10 Essential (primary) hypertension: Secondary | ICD-10-CM | POA: Diagnosis not present

## 2022-11-19 DIAGNOSIS — E559 Vitamin D deficiency, unspecified: Secondary | ICD-10-CM | POA: Diagnosis not present

## 2022-11-19 DIAGNOSIS — Z1329 Encounter for screening for other suspected endocrine disorder: Secondary | ICD-10-CM | POA: Diagnosis not present

## 2022-11-19 DIAGNOSIS — N1831 Chronic kidney disease, stage 3a: Secondary | ICD-10-CM | POA: Diagnosis not present

## 2022-11-19 DIAGNOSIS — R7989 Other specified abnormal findings of blood chemistry: Secondary | ICD-10-CM | POA: Diagnosis not present

## 2022-11-19 DIAGNOSIS — Z13220 Encounter for screening for lipoid disorders: Secondary | ICD-10-CM | POA: Diagnosis not present

## 2022-11-19 DIAGNOSIS — R739 Hyperglycemia, unspecified: Secondary | ICD-10-CM | POA: Diagnosis not present

## 2022-11-20 DIAGNOSIS — M2548 Effusion, other site: Secondary | ICD-10-CM | POA: Diagnosis not present

## 2022-11-20 DIAGNOSIS — M47817 Spondylosis without myelopathy or radiculopathy, lumbosacral region: Secondary | ICD-10-CM | POA: Diagnosis not present

## 2022-11-20 DIAGNOSIS — M5416 Radiculopathy, lumbar region: Secondary | ICD-10-CM | POA: Diagnosis not present

## 2022-11-20 DIAGNOSIS — R200 Anesthesia of skin: Secondary | ICD-10-CM | POA: Diagnosis not present

## 2022-11-22 DIAGNOSIS — K5900 Constipation, unspecified: Secondary | ICD-10-CM | POA: Diagnosis not present

## 2022-11-22 DIAGNOSIS — N182 Chronic kidney disease, stage 2 (mild): Secondary | ICD-10-CM | POA: Diagnosis not present

## 2022-11-22 DIAGNOSIS — F325 Major depressive disorder, single episode, in full remission: Secondary | ICD-10-CM | POA: Diagnosis not present

## 2022-11-22 DIAGNOSIS — Z9181 History of falling: Secondary | ICD-10-CM | POA: Diagnosis not present

## 2022-11-22 DIAGNOSIS — F419 Anxiety disorder, unspecified: Secondary | ICD-10-CM | POA: Diagnosis not present

## 2022-11-22 DIAGNOSIS — L400 Psoriasis vulgaris: Secondary | ICD-10-CM | POA: Diagnosis not present

## 2022-11-22 DIAGNOSIS — H3581 Retinal edema: Secondary | ICD-10-CM | POA: Diagnosis not present

## 2022-11-22 DIAGNOSIS — Z8249 Family history of ischemic heart disease and other diseases of the circulatory system: Secondary | ICD-10-CM | POA: Diagnosis not present

## 2022-11-22 DIAGNOSIS — M810 Age-related osteoporosis without current pathological fracture: Secondary | ICD-10-CM | POA: Diagnosis not present

## 2022-11-22 DIAGNOSIS — E785 Hyperlipidemia, unspecified: Secondary | ICD-10-CM | POA: Diagnosis not present

## 2022-11-22 DIAGNOSIS — Z809 Family history of malignant neoplasm, unspecified: Secondary | ICD-10-CM | POA: Diagnosis not present

## 2022-11-22 DIAGNOSIS — G4700 Insomnia, unspecified: Secondary | ICD-10-CM | POA: Diagnosis not present

## 2022-11-22 DIAGNOSIS — I499 Cardiac arrhythmia, unspecified: Secondary | ICD-10-CM | POA: Diagnosis not present

## 2022-11-22 DIAGNOSIS — K219 Gastro-esophageal reflux disease without esophagitis: Secondary | ICD-10-CM | POA: Diagnosis not present

## 2022-11-22 DIAGNOSIS — R32 Unspecified urinary incontinence: Secondary | ICD-10-CM | POA: Diagnosis not present

## 2022-11-22 DIAGNOSIS — R011 Cardiac murmur, unspecified: Secondary | ICD-10-CM | POA: Diagnosis not present

## 2022-11-22 DIAGNOSIS — M1990 Unspecified osteoarthritis, unspecified site: Secondary | ICD-10-CM | POA: Diagnosis not present

## 2022-11-22 DIAGNOSIS — K08109 Complete loss of teeth, unspecified cause, unspecified class: Secondary | ICD-10-CM | POA: Diagnosis not present

## 2022-11-26 DIAGNOSIS — R300 Dysuria: Secondary | ICD-10-CM | POA: Diagnosis not present

## 2022-11-26 DIAGNOSIS — Z981 Arthrodesis status: Secondary | ICD-10-CM | POA: Diagnosis not present

## 2022-11-26 DIAGNOSIS — K2270 Barrett's esophagus without dysplasia: Secondary | ICD-10-CM | POA: Diagnosis not present

## 2022-11-26 DIAGNOSIS — F339 Major depressive disorder, recurrent, unspecified: Secondary | ICD-10-CM | POA: Diagnosis not present

## 2022-11-26 DIAGNOSIS — Z23 Encounter for immunization: Secondary | ICD-10-CM | POA: Diagnosis not present

## 2022-11-26 DIAGNOSIS — M159 Polyosteoarthritis, unspecified: Secondary | ICD-10-CM | POA: Diagnosis not present

## 2022-11-26 DIAGNOSIS — I5032 Chronic diastolic (congestive) heart failure: Secondary | ICD-10-CM | POA: Diagnosis not present

## 2022-11-26 DIAGNOSIS — N182 Chronic kidney disease, stage 2 (mild): Secondary | ICD-10-CM | POA: Diagnosis not present

## 2022-11-26 DIAGNOSIS — N1831 Chronic kidney disease, stage 3a: Secondary | ICD-10-CM | POA: Diagnosis not present

## 2022-11-26 DIAGNOSIS — M5136 Other intervertebral disc degeneration, lumbar region: Secondary | ICD-10-CM | POA: Diagnosis not present

## 2022-11-26 DIAGNOSIS — R7989 Other specified abnormal findings of blood chemistry: Secondary | ICD-10-CM | POA: Diagnosis not present

## 2022-11-26 DIAGNOSIS — R296 Repeated falls: Secondary | ICD-10-CM | POA: Diagnosis not present

## 2022-11-26 DIAGNOSIS — D696 Thrombocytopenia, unspecified: Secondary | ICD-10-CM | POA: Diagnosis not present

## 2022-11-27 DIAGNOSIS — L304 Erythema intertrigo: Secondary | ICD-10-CM | POA: Diagnosis not present

## 2022-11-27 DIAGNOSIS — L821 Other seborrheic keratosis: Secondary | ICD-10-CM | POA: Diagnosis not present

## 2022-11-27 DIAGNOSIS — Z85828 Personal history of other malignant neoplasm of skin: Secondary | ICD-10-CM | POA: Diagnosis not present

## 2022-11-30 DIAGNOSIS — M5416 Radiculopathy, lumbar region: Secondary | ICD-10-CM | POA: Diagnosis not present

## 2023-01-01 DIAGNOSIS — H43813 Vitreous degeneration, bilateral: Secondary | ICD-10-CM | POA: Diagnosis not present

## 2023-01-01 DIAGNOSIS — H35433 Paving stone degeneration of retina, bilateral: Secondary | ICD-10-CM | POA: Diagnosis not present

## 2023-01-01 DIAGNOSIS — M5416 Radiculopathy, lumbar region: Secondary | ICD-10-CM | POA: Diagnosis not present

## 2023-01-01 DIAGNOSIS — H353231 Exudative age-related macular degeneration, bilateral, with active choroidal neovascularization: Secondary | ICD-10-CM | POA: Diagnosis not present

## 2023-01-01 DIAGNOSIS — M47812 Spondylosis without myelopathy or radiculopathy, cervical region: Secondary | ICD-10-CM | POA: Diagnosis not present

## 2023-02-01 DIAGNOSIS — E559 Vitamin D deficiency, unspecified: Secondary | ICD-10-CM | POA: Diagnosis not present

## 2023-02-01 DIAGNOSIS — K219 Gastro-esophageal reflux disease without esophagitis: Secondary | ICD-10-CM | POA: Diagnosis not present

## 2023-02-01 DIAGNOSIS — K644 Residual hemorrhoidal skin tags: Secondary | ICD-10-CM | POA: Diagnosis not present

## 2023-02-18 DIAGNOSIS — N189 Chronic kidney disease, unspecified: Secondary | ICD-10-CM | POA: Diagnosis not present

## 2023-02-22 DIAGNOSIS — H02834 Dermatochalasis of left upper eyelid: Secondary | ICD-10-CM | POA: Diagnosis not present

## 2023-02-22 DIAGNOSIS — H35433 Paving stone degeneration of retina, bilateral: Secondary | ICD-10-CM | POA: Diagnosis not present

## 2023-02-22 DIAGNOSIS — H02831 Dermatochalasis of right upper eyelid: Secondary | ICD-10-CM | POA: Diagnosis not present

## 2023-02-22 DIAGNOSIS — H353231 Exudative age-related macular degeneration, bilateral, with active choroidal neovascularization: Secondary | ICD-10-CM | POA: Diagnosis not present

## 2023-02-22 DIAGNOSIS — H43813 Vitreous degeneration, bilateral: Secondary | ICD-10-CM | POA: Diagnosis not present

## 2023-02-25 DIAGNOSIS — F339 Major depressive disorder, recurrent, unspecified: Secondary | ICD-10-CM | POA: Diagnosis not present

## 2023-02-25 DIAGNOSIS — I5032 Chronic diastolic (congestive) heart failure: Secondary | ICD-10-CM | POA: Diagnosis not present

## 2023-02-25 DIAGNOSIS — N1831 Chronic kidney disease, stage 3a: Secondary | ICD-10-CM | POA: Diagnosis not present

## 2023-02-25 DIAGNOSIS — I10 Essential (primary) hypertension: Secondary | ICD-10-CM | POA: Diagnosis not present

## 2023-02-25 DIAGNOSIS — D696 Thrombocytopenia, unspecified: Secondary | ICD-10-CM | POA: Diagnosis not present

## 2023-02-25 DIAGNOSIS — M159 Polyosteoarthritis, unspecified: Secondary | ICD-10-CM | POA: Diagnosis not present

## 2023-02-25 DIAGNOSIS — Z6841 Body Mass Index (BMI) 40.0 and over, adult: Secondary | ICD-10-CM | POA: Diagnosis not present

## 2023-02-25 DIAGNOSIS — B3789 Other sites of candidiasis: Secondary | ICD-10-CM | POA: Diagnosis not present

## 2023-02-25 DIAGNOSIS — I517 Cardiomegaly: Secondary | ICD-10-CM | POA: Diagnosis not present

## 2023-03-08 DIAGNOSIS — I130 Hypertensive heart and chronic kidney disease with heart failure and stage 1 through stage 4 chronic kidney disease, or unspecified chronic kidney disease: Secondary | ICD-10-CM | POA: Diagnosis not present

## 2023-03-08 DIAGNOSIS — I350 Nonrheumatic aortic (valve) stenosis: Secondary | ICD-10-CM | POA: Diagnosis not present

## 2023-03-08 DIAGNOSIS — M159 Polyosteoarthritis, unspecified: Secondary | ICD-10-CM | POA: Diagnosis not present

## 2023-03-08 DIAGNOSIS — D696 Thrombocytopenia, unspecified: Secondary | ICD-10-CM | POA: Diagnosis not present

## 2023-03-08 DIAGNOSIS — N1831 Chronic kidney disease, stage 3a: Secondary | ICD-10-CM | POA: Diagnosis not present

## 2023-03-08 DIAGNOSIS — F339 Major depressive disorder, recurrent, unspecified: Secondary | ICD-10-CM | POA: Diagnosis not present

## 2023-03-08 DIAGNOSIS — I5032 Chronic diastolic (congestive) heart failure: Secondary | ICD-10-CM | POA: Diagnosis not present

## 2023-03-28 DIAGNOSIS — M5416 Radiculopathy, lumbar region: Secondary | ICD-10-CM | POA: Diagnosis not present

## 2023-04-02 DIAGNOSIS — I5032 Chronic diastolic (congestive) heart failure: Secondary | ICD-10-CM | POA: Diagnosis not present

## 2023-04-02 DIAGNOSIS — I129 Hypertensive chronic kidney disease with stage 1 through stage 4 chronic kidney disease, or unspecified chronic kidney disease: Secondary | ICD-10-CM | POA: Diagnosis not present

## 2023-04-02 DIAGNOSIS — I110 Hypertensive heart disease with heart failure: Secondary | ICD-10-CM | POA: Diagnosis not present

## 2023-04-02 DIAGNOSIS — N1831 Chronic kidney disease, stage 3a: Secondary | ICD-10-CM | POA: Diagnosis not present

## 2023-04-02 DIAGNOSIS — F339 Major depressive disorder, recurrent, unspecified: Secondary | ICD-10-CM | POA: Diagnosis not present

## 2023-04-02 DIAGNOSIS — I517 Cardiomegaly: Secondary | ICD-10-CM | POA: Diagnosis not present

## 2023-04-02 DIAGNOSIS — I130 Hypertensive heart and chronic kidney disease with heart failure and stage 1 through stage 4 chronic kidney disease, or unspecified chronic kidney disease: Secondary | ICD-10-CM | POA: Diagnosis not present

## 2023-04-02 DIAGNOSIS — M159 Polyosteoarthritis, unspecified: Secondary | ICD-10-CM | POA: Diagnosis not present

## 2023-04-02 DIAGNOSIS — D696 Thrombocytopenia, unspecified: Secondary | ICD-10-CM | POA: Diagnosis not present

## 2023-04-04 DIAGNOSIS — H02834 Dermatochalasis of left upper eyelid: Secondary | ICD-10-CM | POA: Diagnosis not present

## 2023-04-04 DIAGNOSIS — Z6838 Body mass index (BMI) 38.0-38.9, adult: Secondary | ICD-10-CM | POA: Diagnosis not present

## 2023-04-04 DIAGNOSIS — E6609 Other obesity due to excess calories: Secondary | ICD-10-CM | POA: Diagnosis not present

## 2023-04-04 DIAGNOSIS — H02831 Dermatochalasis of right upper eyelid: Secondary | ICD-10-CM | POA: Diagnosis not present

## 2023-04-04 DIAGNOSIS — E66812 Obesity, class 2: Secondary | ICD-10-CM | POA: Diagnosis not present

## 2023-04-04 DIAGNOSIS — R519 Headache, unspecified: Secondary | ICD-10-CM | POA: Diagnosis not present

## 2023-04-25 ENCOUNTER — Ambulatory Visit (INDEPENDENT_AMBULATORY_CARE_PROVIDER_SITE_OTHER): Payer: Medicare PPO | Admitting: Gastroenterology

## 2023-04-29 DIAGNOSIS — Z6838 Body mass index (BMI) 38.0-38.9, adult: Secondary | ICD-10-CM | POA: Diagnosis not present

## 2023-04-29 DIAGNOSIS — B372 Candidiasis of skin and nail: Secondary | ICD-10-CM | POA: Diagnosis not present

## 2023-04-29 DIAGNOSIS — R030 Elevated blood-pressure reading, without diagnosis of hypertension: Secondary | ICD-10-CM | POA: Diagnosis not present

## 2023-04-29 DIAGNOSIS — E669 Obesity, unspecified: Secondary | ICD-10-CM | POA: Diagnosis not present

## 2023-05-06 DIAGNOSIS — H35433 Paving stone degeneration of retina, bilateral: Secondary | ICD-10-CM | POA: Diagnosis not present

## 2023-05-06 DIAGNOSIS — H02831 Dermatochalasis of right upper eyelid: Secondary | ICD-10-CM | POA: Diagnosis not present

## 2023-05-06 DIAGNOSIS — H43813 Vitreous degeneration, bilateral: Secondary | ICD-10-CM | POA: Diagnosis not present

## 2023-05-06 DIAGNOSIS — H02834 Dermatochalasis of left upper eyelid: Secondary | ICD-10-CM | POA: Diagnosis not present

## 2023-05-06 DIAGNOSIS — H353231 Exudative age-related macular degeneration, bilateral, with active choroidal neovascularization: Secondary | ICD-10-CM | POA: Diagnosis not present

## 2023-05-13 DIAGNOSIS — D649 Anemia, unspecified: Secondary | ICD-10-CM | POA: Diagnosis not present

## 2023-05-13 DIAGNOSIS — Z13220 Encounter for screening for lipoid disorders: Secondary | ICD-10-CM | POA: Diagnosis not present

## 2023-05-13 DIAGNOSIS — Z1329 Encounter for screening for other suspected endocrine disorder: Secondary | ICD-10-CM | POA: Diagnosis not present

## 2023-05-13 DIAGNOSIS — E876 Hypokalemia: Secondary | ICD-10-CM | POA: Diagnosis not present

## 2023-05-13 DIAGNOSIS — N183 Chronic kidney disease, stage 3 unspecified: Secondary | ICD-10-CM | POA: Diagnosis not present

## 2023-05-13 DIAGNOSIS — R7989 Other specified abnormal findings of blood chemistry: Secondary | ICD-10-CM | POA: Diagnosis not present

## 2023-05-17 DIAGNOSIS — B3732 Chronic candidiasis of vulva and vagina: Secondary | ICD-10-CM | POA: Diagnosis not present

## 2023-05-22 DIAGNOSIS — D696 Thrombocytopenia, unspecified: Secondary | ICD-10-CM | POA: Diagnosis not present

## 2023-05-22 DIAGNOSIS — F339 Major depressive disorder, recurrent, unspecified: Secondary | ICD-10-CM | POA: Diagnosis not present

## 2023-05-22 DIAGNOSIS — M159 Polyosteoarthritis, unspecified: Secondary | ICD-10-CM | POA: Diagnosis not present

## 2023-05-22 DIAGNOSIS — I10 Essential (primary) hypertension: Secondary | ICD-10-CM | POA: Diagnosis not present

## 2023-05-22 DIAGNOSIS — Z6838 Body mass index (BMI) 38.0-38.9, adult: Secondary | ICD-10-CM | POA: Diagnosis not present

## 2023-05-23 DIAGNOSIS — I10 Essential (primary) hypertension: Secondary | ICD-10-CM | POA: Diagnosis not present

## 2023-05-23 DIAGNOSIS — Z471 Aftercare following joint replacement surgery: Secondary | ICD-10-CM | POA: Diagnosis not present

## 2023-05-23 DIAGNOSIS — M25552 Pain in left hip: Secondary | ICD-10-CM | POA: Diagnosis not present

## 2023-05-23 DIAGNOSIS — Z880 Allergy status to penicillin: Secondary | ICD-10-CM | POA: Diagnosis not present

## 2023-05-23 DIAGNOSIS — Z882 Allergy status to sulfonamides status: Secondary | ICD-10-CM | POA: Diagnosis not present

## 2023-05-23 DIAGNOSIS — Z91040 Latex allergy status: Secondary | ICD-10-CM | POA: Diagnosis not present

## 2023-05-23 DIAGNOSIS — S79912A Unspecified injury of left hip, initial encounter: Secondary | ICD-10-CM | POA: Diagnosis not present

## 2023-05-23 DIAGNOSIS — M1990 Unspecified osteoarthritis, unspecified site: Secondary | ICD-10-CM | POA: Diagnosis not present

## 2023-05-27 DIAGNOSIS — Z1231 Encounter for screening mammogram for malignant neoplasm of breast: Secondary | ICD-10-CM | POA: Diagnosis not present

## 2023-05-28 DIAGNOSIS — Z85820 Personal history of malignant melanoma of skin: Secondary | ICD-10-CM | POA: Diagnosis not present

## 2023-05-28 DIAGNOSIS — C4441 Basal cell carcinoma of skin of scalp and neck: Secondary | ICD-10-CM | POA: Diagnosis not present

## 2023-05-28 DIAGNOSIS — L0100 Impetigo, unspecified: Secondary | ICD-10-CM | POA: Diagnosis not present

## 2023-05-28 DIAGNOSIS — L570 Actinic keratosis: Secondary | ICD-10-CM | POA: Diagnosis not present

## 2023-05-28 DIAGNOSIS — L304 Erythema intertrigo: Secondary | ICD-10-CM | POA: Diagnosis not present

## 2023-05-28 DIAGNOSIS — D485 Neoplasm of uncertain behavior of skin: Secondary | ICD-10-CM | POA: Diagnosis not present

## 2023-06-11 ENCOUNTER — Ambulatory Visit (INDEPENDENT_AMBULATORY_CARE_PROVIDER_SITE_OTHER): Payer: Medicare PPO | Admitting: Gastroenterology

## 2023-06-11 ENCOUNTER — Encounter (INDEPENDENT_AMBULATORY_CARE_PROVIDER_SITE_OTHER): Payer: Self-pay | Admitting: Gastroenterology

## 2023-06-11 VITALS — BP 134/82 | HR 75 | Temp 97.3°F | Ht 63.0 in | Wt 223.5 lb

## 2023-06-11 DIAGNOSIS — K582 Mixed irritable bowel syndrome: Secondary | ICD-10-CM | POA: Diagnosis not present

## 2023-06-11 DIAGNOSIS — K649 Unspecified hemorrhoids: Secondary | ICD-10-CM | POA: Diagnosis not present

## 2023-06-11 DIAGNOSIS — R110 Nausea: Secondary | ICD-10-CM

## 2023-06-11 DIAGNOSIS — K219 Gastro-esophageal reflux disease without esophagitis: Secondary | ICD-10-CM | POA: Diagnosis not present

## 2023-06-11 NOTE — Progress Notes (Addendum)
 Referring Provider: Alston Jerry, MD Primary Care Physician:  Alston Jerry, MD Primary GI Physician: Dr. Sammi Crick   Chief Complaint  Patient presents with   Follow-up    Patient here today for a follow up on constipation. Patient says she is still having issues with this and also having issues with hemorrhoids. Patient denies any sight of blood,but has some pain when sitting. She says she went to Urgent care due to the pain and they prescribed her Fluconazole 200 mg once per week for 12 weeks, she says the pain is somewhat better after starting this,but not completely gone.    HPI:   [name] [name] is a 83 y.o. female with past medical history of IBS-C, IDA, Gerd, Barrett's esophagus, anxiety, depression, HTN, macular degeneration   Patient presenting today for:  Follow up of IBS-M, GERD, hemorrhoids, and nausea  Last seen August 2024, at that time reporting she feels like it stopped and left upper quadrant.  Having some nausea and vomiting.  Taking Zofran as needed.  Having intermittent diarrhea.  Taking MiraLAX 1-3 capfuls a day for intermittent constipation.  Hemorrhoids better using counter after hemorrhoid cream 3 times daily.  Having occasional acid regurgitation with certain foods taking Meprazole 20 mg twice daily.  Reported some yellowing of the back of her throat at times.  Recommended to schedule GES, continue oMeprazole 20 mg twice daily, continue [facility] hemorrhoid cream, continue MiraLAX/stool softeners, start over-the-counter Xyzal for postnasal drainage  Gastric emptying study done November 16, 2022 was normal  Present: GERD:doing omeprazole 20mg , taking it 1-2 times per day. Does find it works better when she takes her second dose. Having symptoms with either heartburn or acid regurgitation maybe every other day. Trying to avoid certain foods such as chocolate as this causes her GERD symptoms. She is using gas x some PRN as well. She also thinks  stress plays a role in her reflux. Reports it can happen any time of day.   IBS-M: having some abdominal pain in mid abdomen at times. She is taking stool softeners and miralax PRN depending on how her bowels are moving. Will usually do miralax and then take stool softener if she does not have a BM. She notes she will do okay for a few days then she will become constipated again and then will take miralax or stool softener again.   Hemorrhoids: previously doing CA hemorrhoid cream which helps but she does not have any currently. Has some rectal discomfort from her hemorrhoids   Nausea: has improved some with change in diet and watching what she is eating. Taking zofran PRN which seems to help, she does not need this on a regular basis.   Appetite is good. Weight is stable. No rectal bleeding or melena.    CT A/P with contrast: 08/2022 No acute findings in the abdomen or pelvis to explain the patient's symptoms. 2. Constipation and diverticulosis. 3. Aortic atherosclerosis. 4. Aortic valve calcifications. Echocardiography may be helpful if not previously performed. 5. Osteopenia and degenerative change. 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 6. 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 No further imaging follow-up recommended. Last Colonoscopy:2/2022Two 3 to 5 mm polyps in the cecum,  - Three 3 to 6 mm polyps in the ascending colon,  - Four 4 to 8 mm polyps in the transverse colon,  - Seven 3 to 9 mm polyps in the descending colon,  - Diverticulosis in the sigmoid colon and  in the descending colon. - The distal rectum and anal verge are normal on retroflexion view. (Tubular adenomas, sessile serrated polyps, hyperplastic polyps) no repeat recommended  Last Endoscopy: 08/2022 - Diverticulum in the lower third of the esophagus.                           - Barrett's esophagus. Biopsied BE without dysplasia                            - Gastric bypass  with a pouch 4 cm in length.                            Gastrojejunal anastomosis characterized by healthy                            appearing mucosa.                           - Normal examined jejunum (afferent and efferent                            limbs).   Recommendations:  Repeat EGD in 4 years if medically fit  Filed Weights   06/11/23 1357  Weight: 223 lb 8 oz (101.4 kg)     Past Medical History:  Diagnosis Date   Anemia    Anxiety    Arthritis    Barrett's esophagus    [facility] on [facility]   Depression    GERD (gastroesophageal reflux disease)    H/O hiatal hernia    Headache    Hypertension    Macular degeneration    Obesity    Pneumonia    PONV (postoperative nausea and vomiting)    Seasonal allergies    Sepsis (HCC) 10/02/2013    Past Surgical History:  Procedure Laterality Date   ABDOMINAL HYSTERECTOMY     ABDOMINAL SURGERY     APPENDECTOMY     BACK SURGERY     lumbar x2 by Dr Larrie Po   BIOPSY  09/03/2019   Procedure: BIOPSY;  Surgeon: Ruby Corporal, MD;  [facility];  Service: Endoscopy;;  esophagus   BIOPSY  08/21/2022   Procedure: BIOPSY;  Surgeon: Urban Garden, MD;  [facility];  Service: Gastroenterology;;   CARPAL TUNNEL RELEASE Right 03/2014   Dr. Murrel Arnt   CATARACT EXTRACTION W/PHACO Left 08/06/2013   Procedure: CATARACT EXTRACTION PHACO AND INTRAOCULAR LENS PLACEMENT LEFT EYE;  Surgeon: Anner Kill, MD;  Location: [facility];  Service: Ophthalmology;  Laterality: Left;  CDE: 9.55   CATARACT EXTRACTION W/PHACO Right 08/24/2013   Procedure: CATARACT EXTRACTION PHACO AND INTRAOCULAR LENS PLACEMENT (IOC);  Surgeon: Anner Kill, MD;  Location: [facility];  Service: Ophthalmology;  Laterality: Right;  CDE:8.30   CERVICAL FUSION     CHOLECYSTECTOMY     COLONOSCOPY N/A 11/03/2013   SLF: 1. Normal mucosa in the terminal ileum. 2. 13 colon polyps removed. 3. Moderate diverticulosis in the descending colon and sigmoid  colon 4. the left colon is redundant 5. Small internal  hemorrhoids.   COLONOSCOPY N/A 09/25/2017   Procedure: COLONOSCOPY;  Surgeon: Ruby Corporal, MD;  [facility];  Service: Endoscopy;  Laterality: N/A;  1:55   COLONOSCOPY WITH PROPOFOL N/A 04/29/2020   Castaneda: multiple polyps (tubular adenoma, sessile serrated, hyperplastic) ascending colon, transverse colon, descending colon, diverticulsos in sigmoid and descending colon, rectum/anal vergse normal   ESOPHAGOGASTRODUODENOSCOPY N/A 11/03/2013   SLF: 1. 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 Mild Non-erosive gastritis   ESOPHAGOGASTRODUODENOSCOPY  10/2014   [facility]: IMPRESSIONS: - likely small distal esophageal diverticulum without evidence of fistula, +barrett's esophagus without dysplasia. s/p gastric bypass   ESOPHAGOGASTRODUODENOSCOPY (EGD) WITH PROPOFOL N/A 09/03/2019   rehman: normal hypopharynx, esophagus, mid esophagus, diverticulum in distal esophagus, short segment barretts esophagus, gastric bypass with small sized pouch, gastrojejunal anastomosis with healthy appearing mucosa, normal gastric body and jejunum   ESOPHAGOGASTRODUODENOSCOPY (EGD) WITH PROPOFOL N/A 08/21/2022   Procedure: ESOPHAGOGASTRODUODENOSCOPY (EGD) WITH PROPOFOL;  Surgeon: Urban Garden, MD;  [facility];  Service: Gastroenterology;  Laterality: N/A;  2:30am;asa 3   EYE SURGERY     GASTRIC BYPASS  1979   revision in 1980    HIATAL HERNIA REPAIR     [facility] in 2011   JOINT REPLACEMENT Right 1995   hip   JOINT REPLACEMENT Left 2008   hip   MEDIAL PARTIAL KNEE REPLACEMENT Left    Dr Genevive Ket   PARAESOPHAGEAL HERNIA REPAIR  SEP 2010 DR. MCNATT   POLYPECTOMY  09/25/2017   Procedure: POLYPECTOMY;  Surgeon: Ruby Corporal, MD;  [facility];  Service: Endoscopy;;  colon   POLYPECTOMY  04/29/2020   Procedure: POLYPECTOMY;  Surgeon: Urban Garden,  MD;  [facility];  Service: Gastroenterology;;   SHOULDER ARTHROSCOPY Right    SHOULDER ARTHROSCOPY Right    x2   SKIN BIOPSY     TONSILLECTOMY     TOTAL KNEE ARTHROPLASTY Right 09/08/2018   Procedure: RIGHT TOTAL KNEE ARTHROPLASTY;  Surgeon: Adah Acron, MD;  Location[facility];  Service: Orthopedics;  Laterality: Right;   TOTAL SHOULDER ARTHROPLASTY Right 02/16/2013   Procedure: TOTAL SHOULDER ARTHROPLASTY;  Surgeon: Adah Acron, MD;  Location: [facility];  Service: Orthopedics;  Laterality: Right;  Right Total Shoulder Arthroplasty, Cemented   WEIL OSTEOTOMY Right 07/18/2021   Procedure: RIGHT FOOT REVISION WEIL OSTEOTOMY 2ND AND 3RD METATARSAL, GASTROCNEMIUS RECESSION, PROXIMAL INTERPHALANGEAL RESECTION 2ND AND 3RD TOES;  Surgeon: Timothy Ford, MD;  [facility];  Service: Orthopedics;  Laterality: Right;  regional with monitored anesthesia care    Current Outpatient Medications  Medication Sig Dispense Refill   acetaminophen (TYLENOL) 500 MG tablet Take 500-1,000 mg by mouth every 6 (six) hours as needed for moderate pain or headache.     albuterol (PROVENTIL) (2.5 MG/3ML) 0.083% nebulizer solution Take 3 mLs (2.5 mg total) by nebulization every 4 (four) hours as needed for wheezing. 75 mL 12   alendronate (FOSAMAX) 70 MG tablet Take 70 mg by mouth once a week.     AZO-CRANBERRY PO Take 2 tablets by mouth daily as needed (kidney infections).      Calcium-Phosphorus-Vitamin D (CITRACAL +D3 PO) Take 1 tablet by mouth daily.     Cholecalciferol (VITAMIN D3) 50 MCG (2000 UT) capsule Take 2,000 Units by mouth daily.      conjugated estrogens (PREMARIN) vaginal cream Place 1 Applicatorful vaginally at bedtime. Use as directed at bedtime 30 g 12   docusate sodium (COLACE) 100 MG capsule Take 100 mg by mouth at bedtime as needed for mild constipation.  Ferrous Sulfate (SLOW FE PO) Take 1 tablet by mouth every 7 (seven) days.     fluconazole (DIFLUCAN) 200 MG  tablet Take 200 mg by mouth once a week.     fluticasone (FLONASE) 50 MCG/ACT nasal spray Place 2 sprays into both nostrils daily as needed for allergies.     ketoconazole (NIZORAL) 2 % cream Apply 1 Application topically 2 (two) times daily as needed for irritation.     ketoconazole (NIZORAL) 2 % shampoo Apply 1 Application topically every 7 (seven) days.     Lactobacillus (FLORAJEN ACIDOPHILUS) CAPS Take 1 capsule by mouth daily before breakfast.     lisinopril (ZESTRIL) 10 MG tablet Take 10 mg by mouth every morning.     NONFORMULARY OR COMPOUNDED ITEM [facility] hemorrhoid cream     omeprazole (PRILOSEC) 20 MG capsule Take 1 capsule (20 mg total) by mouth daily. (Patient taking differently: Take 20 mg by mouth in the morning and at bedtime.) 90 capsule 3   ondansetron (ZOFRAN) 4 MG tablet Take 4 mg by mouth every 8 (eight) hours as needed for nausea or vomiting.     sertraline (ZOLOFT) 100 MG tablet Take 100 mg by mouth daily.     simethicone (MYLICON) 125 MG chewable tablet Chew 125 mg by mouth every 6 (six) hours as needed for flatulence.     traZODone (DESYREL) 50 MG tablet Take 50 mg by mouth at bedtime.     Nystatin (GERHARDT'S BUTT CREAM) CREA Apply 1 application topically 3 (three) times daily as needed for irritation. (Patient not taking: Reported on 06/11/2023) 1 each 11   nystatin ointment (MYCOSTATIN) Apply 1 application topically 2 (two) times daily as needed (for irritation). (Patient not taking: Reported on 06/11/2023)     No current facility-administered medications for this visit.    Allergies as of 06/11/2023 - Review Complete 06/11/2023  Allergen Reaction Noted   Gadavist [gadobutrol] Itching and Swelling 07/05/2021   Novocain [procaine hcl] Other (See Comments) 11/20/2011   Other Hives and Other (See Comments) 11/20/2011   Latex Rash 07/22/2013   Penicillins Rash and Other (See Comments) 11/20/2011   Sulfa antibiotics Hives and Rash 11/20/2011    Social History    Socioeconomic History   Marital status: Single    Spouse name: Not on file   Number of children: 0   Years of education: Not on file   Highest education level: Some college, no degree  Occupational History   Not on file  Tobacco Use   Smoking status: Never    Passive exposure: Past   Smokeless tobacco: Never  Vaping Use   Vaping status: Never Used  Substance and Sexual Activity   Alcohol use: No   Drug use: No   Sexual activity: Yes    Birth control/protection: Surgical  Other Topics Concern   Not on file  Social History Narrative   12/26/21 lives alone   Social Drivers of Health   Financial Resource Strain: Not on file  Food Insecurity: No Food Insecurity (12/29/2021)   Hunger Vital Sign    Worried About Running Out of Food in the Last Year: Never true    Ran Out of Food in the Last Year: Never true  Transportation Needs: No Transportation Needs (12/30/2021)   PRAPARE - Administrator, Civil Service (Medical): No    Lack of Transportation (Non-Medical): No  Physical Activity: Not on file  Stress: Not on file  Social Connections: Not on file  Review of systems General: negative for malaise, night sweats, fever, chills, weight los Neck: Negative for lumps, goiter, pain and significant neck swelling Resp: Negative for cough, wheezing, dyspnea at rest CV: Negative for chest pain, leg swelling, palpitations, orthopnea GI: denies melena, hematochezia, vomiting, diarrhea, dysphagia, odyonophagia, early satiety or unintentional weight loss. +constipation +nausea +abdominal pain  The remainder of the review of systems is noncontributory.  Physical Exam: BP 134/82 (BP Location: Left Arm, Patient Position: Sitting, Cuff Size: Large)   Pulse 75   Temp (!) 97.3 F (36.3 C) (Temporal)   Ht 5\' 3"  (1.6 m)   Wt 223 lb 8 oz (101.4 kg)   BMI 39.59 kg/m  General:   Alert and oriented. No distress noted. Pleasant and cooperative.  Head:  Normocephalic and  atraumatic. Eyes:  Conjuctiva clear without scleral icterus. Mouth:  Oral mucosa pink and moist. Good dentition. No lesions. Heart: Normal rate and rhythm, s1 and s2 heart sounds present.  Lungs: Clear lung sounds in all lobes. Respirations equal and unlabored. Abdomen:  +BS, soft, non-tender and non-distended. No rebound or guarding. No HSM or masses noted. Derm: No palmar erythema or jaundice Msk:  Symmetrical without gross deformities. Normal posture. Extremities:  Without edema. Neurologic:  Alert and  oriented x4 Psych:  Alert and cooperative. Normal mood and affect.  Invalid input(s): "6 MONTHS"   ASSESSMENT: CANDAS DEEMER is a 83 y.o. female presenting today for follow up of IBS-M, GERD,  nausea and hemorrhoids  GERD:doing omeprazole 20mg , taking it 1-2 times per day, finds it works better for her on days she takes it BID. Encouraged patient to maintain good reflux precautions and take PPI consistently BID. May consider increase in dosage if consistent BID dosing does not improve symptoms.   IBS-M: having some abdominal pain in mid abdomen at times. She is taking stool softeners and miralax PRN. Recommend trying miralax more consistently, 1-2 times per day to keep bowels moving and prevent episodes of constipation. Can continue stool softener as needed.   Hemorrhoids: previously doing CA hemorrhoid cream which helps. She has some discomfort with hemorrhoids but no bleeding. Requesting refill of CA hemorrhoid cream. As above, needs to avoid constipation and straining, limit toilet time.   Nausea: has improved some with change in diet and watching what she is eating. Taking zofran PRN which seems to help. Will continue with current regimen. As above, GES done was negative for gastroparesis.     PLAN:  Continue omeprazole 20mg , take this BID consistently  Continue miralax, try taking 1-2 capfuls daily CA hemorrhoid cream PRN  Good reflux precautions  Increase water intake, aim for  atleast 64 oz per day Increase fruits, veggies and whole grains, kiwi and prunes are especially good for constipation Can continue with zofran 4mg  PRN for nausea   All questions were answered, patient verbalized understanding and is in agreement with plan as outlined above.   Follow Up: 6 months   Danny Yackley L. Adrien Alberta, MSN, APRN, AGNP-C Adult-Gerontology Nurse Practitioner [facility]  I have reviewed the note and agree with the APP's assessment as described in this progress note  Samantha Cress, MD Gastroenterology and Hepatology [facility]

## 2023-06-11 NOTE — Patient Instructions (Signed)
 Continue omeprazole 20mg , take this twice daily consistently  Continue miralax, try taking 1-2 capfuls daily to every other day to keep bowels moving  I will call in [facility] hemorrhoid cream for you to use  Increase water intake, aim for atleast 64 oz per day Increase fruits, veggies and whole grains, kiwi and prunes are especially good for constipation Can continue with zofran 4mg  as needed for nausea  Follow up 6 months  It was a pleasure to see you today. I want to create trusting relationships with patients and provide genuine, compassionate, and quality care. I truly value your feedback! please be on the lookout for a survey regarding your visit with me today. I appreciate your input about our visit and your time in completing this!    [name] Cen L. Jeanmarie Hubert, MSN, APRN, AGNP-C Adult-Gerontology Nurse Practitioner [facility]enter[facility] at [facility]

## 2023-06-17 DIAGNOSIS — C4441 Basal cell carcinoma of skin of scalp and neck: Secondary | ICD-10-CM | POA: Diagnosis not present

## 2023-06-18 DIAGNOSIS — R110 Nausea: Secondary | ICD-10-CM | POA: Insufficient documentation

## 2023-06-18 DIAGNOSIS — H02834 Dermatochalasis of left upper eyelid: Secondary | ICD-10-CM | POA: Diagnosis not present

## 2023-06-18 DIAGNOSIS — H02831 Dermatochalasis of right upper eyelid: Secondary | ICD-10-CM | POA: Diagnosis not present

## 2023-06-18 DIAGNOSIS — E6609 Other obesity due to excess calories: Secondary | ICD-10-CM | POA: Diagnosis not present

## 2023-06-18 DIAGNOSIS — Z6838 Body mass index (BMI) 38.0-38.9, adult: Secondary | ICD-10-CM | POA: Diagnosis not present

## 2023-06-18 DIAGNOSIS — E66812 Obesity, class 2: Secondary | ICD-10-CM | POA: Diagnosis not present

## 2023-07-01 DIAGNOSIS — M5416 Radiculopathy, lumbar region: Secondary | ICD-10-CM | POA: Diagnosis not present

## 2023-07-01 DIAGNOSIS — M47812 Spondylosis without myelopathy or radiculopathy, cervical region: Secondary | ICD-10-CM | POA: Diagnosis not present

## 2023-07-02 DIAGNOSIS — H353221 Exudative age-related macular degeneration, left eye, with active choroidal neovascularization: Secondary | ICD-10-CM | POA: Diagnosis not present

## 2023-07-12 ENCOUNTER — Other Ambulatory Visit: Payer: Self-pay | Admitting: Otolaryngology

## 2023-07-16 DIAGNOSIS — M5416 Radiculopathy, lumbar region: Secondary | ICD-10-CM | POA: Diagnosis not present

## 2023-07-30 ENCOUNTER — Encounter (HOSPITAL_BASED_OUTPATIENT_CLINIC_OR_DEPARTMENT_OTHER): Payer: Self-pay | Admitting: Otolaryngology

## 2023-07-30 ENCOUNTER — Other Ambulatory Visit: Payer: Self-pay

## 2023-08-02 NOTE — Progress Notes (Signed)

## 2023-08-06 NOTE — Anesthesia Preprocedure Evaluation (Signed)
 Anesthesia Evaluation  Patient identified by MRN, date of birth, ID band Patient awake    Reviewed: Allergy & Precautions, NPO status , Patient's Chart, lab work & pertinent test results  History of Anesthesia Complications (+) PONV and history of anesthetic complications  Airway Mallampati: II  TM Distance: >3 FB Neck ROM: Full    Dental no notable dental hx.    Pulmonary neg pulmonary ROS   Pulmonary exam normal        Cardiovascular hypertension, Pt. on medications Normal cardiovascular exam     Neuro/Psych  Headaches  Anxiety Depression       GI/Hepatic Neg liver ROS,GERD  Medicated and Controlled,,  Endo/Other  negative endocrine ROS    Renal/GU negative Renal ROS     Musculoskeletal  (+) Arthritis ,    Abdominal   Peds  Hematology negative hematology ROS (+)   Anesthesia Other Findings Day of surgery medications reviewed with patient.  Reproductive/Obstetrics                             Anesthesia Physical Anesthesia Plan  ASA: 2  Anesthesia Plan: MAC   Post-op Pain Management: Tylenol  PO (pre-op)*   Induction:   PONV Risk Score and Plan: 3 and Treatment may vary due to age or medical condition, Propofol  infusion and Ondansetron   Airway Management Planned: Natural Airway and Simple Face Mask  Additional Equipment: None  Intra-op Plan:   Post-operative Plan:   Informed Consent: I have reviewed the patients History and Physical, chart, labs and discussed the procedure including the risks, benefits and alternatives for the proposed anesthesia with the patient or authorized representative who has indicated his/her understanding and acceptance.       Plan Discussed with: CRNA  Anesthesia Plan Comments:        Anesthesia Quick Evaluation

## 2023-08-07 ENCOUNTER — Ambulatory Visit (HOSPITAL_BASED_OUTPATIENT_CLINIC_OR_DEPARTMENT_OTHER): Payer: Self-pay | Admitting: Anesthesiology

## 2023-08-07 ENCOUNTER — Observation Stay (HOSPITAL_BASED_OUTPATIENT_CLINIC_OR_DEPARTMENT_OTHER)
Admission: RE | Admit: 2023-08-07 | Discharge: 2023-08-08 | Disposition: A | Discharge status: Home / Self Care | Attending: Otolaryngology | Admitting: Otolaryngology

## 2023-08-07 ENCOUNTER — Encounter (HOSPITAL_BASED_OUTPATIENT_CLINIC_OR_DEPARTMENT_OTHER)
Admission: RE | Disposition: A | Discharge status: Home / Self Care | Payer: Self-pay | Source: Home / Self Care | Attending: Otolaryngology

## 2023-08-07 ENCOUNTER — Other Ambulatory Visit: Payer: Self-pay

## 2023-08-07 ENCOUNTER — Encounter (HOSPITAL_BASED_OUTPATIENT_CLINIC_OR_DEPARTMENT_OTHER): Payer: Self-pay | Admitting: Otolaryngology

## 2023-08-07 DIAGNOSIS — H02834 Dermatochalasis of left upper eyelid: Principal | ICD-10-CM | POA: Insufficient documentation

## 2023-08-07 DIAGNOSIS — Z96653 Presence of artificial knee joint, bilateral: Secondary | ICD-10-CM | POA: Insufficient documentation

## 2023-08-07 DIAGNOSIS — Z79899 Other long term (current) drug therapy: Secondary | ICD-10-CM | POA: Diagnosis not present

## 2023-08-07 DIAGNOSIS — H02831 Dermatochalasis of right upper eyelid: Secondary | ICD-10-CM | POA: Diagnosis not present

## 2023-08-07 DIAGNOSIS — Z96611 Presence of right artificial shoulder joint: Secondary | ICD-10-CM | POA: Diagnosis not present

## 2023-08-07 DIAGNOSIS — Z7722 Contact with and (suspected) exposure to environmental tobacco smoke (acute) (chronic): Secondary | ICD-10-CM | POA: Insufficient documentation

## 2023-08-07 DIAGNOSIS — Z91040 Latex allergy status: Secondary | ICD-10-CM | POA: Insufficient documentation

## 2023-08-07 DIAGNOSIS — I10 Essential (primary) hypertension: Secondary | ICD-10-CM | POA: Diagnosis not present

## 2023-08-07 DIAGNOSIS — Z96651 Presence of right artificial knee joint: Secondary | ICD-10-CM | POA: Diagnosis not present

## 2023-08-07 DIAGNOSIS — Z96643 Presence of artificial hip joint, bilateral: Secondary | ICD-10-CM | POA: Diagnosis not present

## 2023-08-07 DIAGNOSIS — F418 Other specified anxiety disorders: Secondary | ICD-10-CM | POA: Diagnosis not present

## 2023-08-07 HISTORY — PX: BROW LIFT: SHX178

## 2023-08-07 SURGERY — BLEPHAROPLASTY
Anesthesia: Monitor Anesthesia Care | Site: Eye | Laterality: Bilateral

## 2023-08-07 MED ORDER — SERTRALINE HCL 100 MG PO TABS
100.0000 mg | ORAL_TABLET | Freq: Every day | ORAL | Status: DC
  Filled 2023-08-07: qty 1

## 2023-08-07 MED ORDER — ONDANSETRON HCL 4 MG/2ML IJ SOLN
INTRAMUSCULAR | Status: AC
  Filled 2023-08-07: qty 2

## 2023-08-07 MED ORDER — OXYCODONE HCL 5 MG PO TABS
2.5000 mg | ORAL_TABLET | Freq: Four times a day (QID) | ORAL | Status: DC | PRN
  Administered 2023-08-07: 2.5 mg via ORAL
  Filled 2023-08-07: qty 1

## 2023-08-07 MED ORDER — 0.9 % SODIUM CHLORIDE (POUR BTL) OPTIME
TOPICAL | Status: DC | PRN
Start: 2023-08-07 — End: 2023-08-07
  Administered 2023-08-07: 1000 mL

## 2023-08-07 MED ORDER — LIDOCAINE-EPINEPHRINE 1 %-1:100000 IJ SOLN
INTRAMUSCULAR | Status: DC | PRN
  Administered 2023-08-07: 4.5 mL

## 2023-08-07 MED ORDER — POVIDONE-IODINE 5 % OP SOLN
OPHTHALMIC | Status: AC
  Filled 2023-08-07: qty 30

## 2023-08-07 MED ORDER — LACTATED RINGERS IV SOLN: INTRAVENOUS | Status: AC

## 2023-08-07 MED ORDER — FENTANYL CITRATE (PF) 100 MCG/2ML IJ SOLN
INTRAMUSCULAR | Status: AC
  Filled 2023-08-07: qty 2

## 2023-08-07 MED ORDER — OXYCODONE HCL 5 MG PO TABS: 5.0000 mg | ORAL_TABLET | Freq: Once | ORAL | Status: DC | PRN

## 2023-08-07 MED ORDER — TOBRAMYCIN 0.3 % OP OINT
TOPICAL_OINTMENT | OPHTHALMIC | Status: DC | PRN
  Administered 2023-08-07: 1 via OPHTHALMIC

## 2023-08-07 MED ORDER — BACITRACIN ZINC 500 UNIT/GM EX OINT
TOPICAL_OINTMENT | CUTANEOUS | Status: AC
  Filled 2023-08-07: qty 28.35

## 2023-08-07 MED ORDER — DOCUSATE SODIUM 100 MG PO CAPS: 100.0000 mg | ORAL_CAPSULE | Freq: Every evening | ORAL | Status: DC | PRN

## 2023-08-07 MED ORDER — ONDANSETRON HCL 4 MG/2ML IJ SOLN
INTRAMUSCULAR | Status: DC | PRN
  Administered 2023-08-07: 4 mg via INTRAVENOUS

## 2023-08-07 MED ORDER — SODIUM BICARBONATE 4.2 % IV SOLN
INTRAVENOUS | Status: AC
  Filled 2023-08-07: qty 10

## 2023-08-07 MED ORDER — OXYCODONE HCL 5 MG PO TABS: 5.0000 mg | ORAL_TABLET | Freq: Four times a day (QID) | ORAL | Status: DC | PRN

## 2023-08-07 MED ORDER — TOBRAMYCIN-DEXAMETHASONE 0.3-0.1 % OP OINT: TOPICAL_OINTMENT | Freq: Two times a day (BID) | OPHTHALMIC | Status: DC

## 2023-08-07 MED ORDER — SODIUM BICARBONATE 4.2 % IV SOLN
INTRAVENOUS | Status: DC | PRN
  Administered 2023-08-07: .5 mL via INTRAVENOUS

## 2023-08-07 MED ORDER — ACETAMINOPHEN 500 MG PO TABS
1000.0000 mg | ORAL_TABLET | Freq: Once | ORAL | Status: AC
  Administered 2023-08-07: 1000 mg via ORAL

## 2023-08-07 MED ORDER — PROPOFOL 500 MG/50ML IV EMUL
INTRAVENOUS | Status: DC | PRN
  Administered 2023-08-07: 100 ug/kg/min via INTRAVENOUS

## 2023-08-07 MED ORDER — MIDAZOLAM HCL 2 MG/2ML IJ SOLN
INTRAMUSCULAR | Status: AC
  Filled 2023-08-07: qty 2

## 2023-08-07 MED ORDER — BSS IO SOLN
INTRAOCULAR | Status: DC | PRN
Start: 2023-08-07 — End: 2023-08-07
  Administered 2023-08-07: 15 mL via INTRAOCULAR

## 2023-08-07 MED ORDER — BSS IO SOLN
INTRAOCULAR | Status: AC
  Filled 2023-08-07: qty 15

## 2023-08-07 MED ORDER — FENTANYL CITRATE (PF) 100 MCG/2ML IJ SOLN: 25.0000 ug | INTRAMUSCULAR | Status: DC | PRN

## 2023-08-07 MED ORDER — TETRACAINE HCL 0.5 % OP SOLN
OPHTHALMIC | Status: DC | PRN
  Administered 2023-08-07: 1 [drp] via OPHTHALMIC

## 2023-08-07 MED ORDER — CLINDAMYCIN PHOSPHATE 900 MG/50ML IV SOLN
900.0000 mg | INTRAVENOUS | Status: AC
  Administered 2023-08-07: 900 mg via INTRAVENOUS

## 2023-08-07 MED ORDER — TOBRAMYCIN-DEXAMETHASONE 0.3-0.1 % OP OINT
TOPICAL_OINTMENT | OPHTHALMIC | Status: AC
  Filled 2023-08-07: qty 3.5

## 2023-08-07 MED ORDER — PROPOFOL 10 MG/ML IV BOLUS
INTRAVENOUS | Status: AC
  Filled 2023-08-07: qty 20

## 2023-08-07 MED ORDER — ONDANSETRON HCL 4 MG PO TABS: 4.0000 mg | ORAL_TABLET | Freq: Three times a day (TID) | ORAL | Status: DC | PRN

## 2023-08-07 MED ORDER — FENTANYL CITRATE (PF) 100 MCG/2ML IJ SOLN
INTRAMUSCULAR | Status: DC | PRN
  Administered 2023-08-07: 25 ug via INTRAVENOUS
  Administered 2023-08-07: 50 ug via INTRAVENOUS
  Administered 2023-08-07: 25 ug via INTRAVENOUS

## 2023-08-07 MED ORDER — LISINOPRIL 10 MG PO TABS: 10.0000 mg | ORAL_TABLET | Freq: Every morning | ORAL | Status: DC

## 2023-08-07 MED ORDER — POVIDONE-IODINE 5 % OP SOLN
OPHTHALMIC | Status: DC | PRN
  Administered 2023-08-07: 1 via OPHTHALMIC

## 2023-08-07 MED ORDER — LIDOCAINE-EPINEPHRINE 1 %-1:100000 IJ SOLN
INTRAMUSCULAR | Status: AC
  Filled 2023-08-07: qty 1

## 2023-08-07 MED ORDER — DEXMEDETOMIDINE HCL IN NACL 80 MCG/20ML IV SOLN
INTRAVENOUS | Status: AC
  Filled 2023-08-07: qty 20

## 2023-08-07 MED ORDER — OXYCODONE HCL 5 MG/5ML PO SOLN: 5.0000 mg | Freq: Once | ORAL | Status: DC | PRN

## 2023-08-07 MED ORDER — TRAZODONE HCL 50 MG PO TABS
50.0000 mg | ORAL_TABLET | Freq: Every day | ORAL | Status: DC
  Administered 2023-08-07: 50 mg via ORAL
  Filled 2023-08-07 (×2): qty 1

## 2023-08-07 MED ORDER — ACETAMINOPHEN 500 MG PO TABS
ORAL_TABLET | ORAL | Status: AC
  Filled 2023-08-07: qty 2

## 2023-08-07 MED ORDER — LIDOCAINE 2% (20 MG/ML) 5 ML SYRINGE
INTRAMUSCULAR | Status: AC
  Filled 2023-08-07: qty 5

## 2023-08-07 MED ORDER — DEXAMETHASONE SODIUM PHOSPHATE 10 MG/ML IJ SOLN
INTRAMUSCULAR | Status: AC
  Filled 2023-08-07: qty 1

## 2023-08-07 MED ORDER — ACETAMINOPHEN 325 MG PO TABS
650.0000 mg | ORAL_TABLET | Freq: Four times a day (QID) | ORAL | Status: DC | PRN
  Administered 2023-08-07 – 2023-08-08 (×2): 650 mg via ORAL
  Filled 2023-08-07 (×2): qty 2

## 2023-08-07 MED ORDER — BUPIVACAINE HCL (PF) 0.25 % IJ SOLN
INTRAMUSCULAR | Status: AC
  Filled 2023-08-07: qty 30

## 2023-08-07 MED ORDER — LACTATED RINGERS IV SOLN: INTRAVENOUS | Status: DC | PRN

## 2023-08-07 MED ORDER — CLINDAMYCIN PHOSPHATE 900 MG/50ML IV SOLN
INTRAVENOUS | Status: AC
  Filled 2023-08-07: qty 50

## 2023-08-07 SURGICAL SUPPLY — 50 items
APPLICATOR COTTON TIP 6 STRL (MISCELLANEOUS) IMPLANT
APPLICATOR COTTON TIP 6IN STRL (MISCELLANEOUS) IMPLANT
APPLICATOR DR MATTHEWS STRL (MISCELLANEOUS) ×2 IMPLANT
BLADE SURG 11 STRL SS (BLADE) IMPLANT
BLADE SURG 15 STRL LF DISP TIS (BLADE) ×4 IMPLANT
BNDG EYE OVAL 2 1/8 X 2 5/8 (GAUZE/BANDAGES/DRESSINGS) IMPLANT
CAUTERY EYE LOW TEMP OLD (MISCELLANEOUS) IMPLANT
CORD BIPOLAR FORCEPS 12FT (ELECTRODE) IMPLANT
COVER BACK TABLE 60X90IN (DRAPES) ×2 IMPLANT
COVER MAYO STAND STRL (DRAPES) ×2 IMPLANT
DRAPE SURG 17X23 STRL (DRAPES) IMPLANT
DRAPE U-SHAPE 76X120 STRL (DRAPES) ×2 IMPLANT
DRSG TELFA 3X8 NADH STRL (GAUZE/BANDAGES/DRESSINGS) IMPLANT
ELECT COATED BLADE 2.86 ST (ELECTRODE) IMPLANT
ELECT NDL BLADE 2-5/6 (NEEDLE) ×2 IMPLANT
ELECT NEEDLE BLADE 2-5/6 (NEEDLE) ×1 IMPLANT
ELECTRODE REM PT RTRN 9FT ADLT (ELECTROSURGICAL) ×2 IMPLANT
FORCEPS BIPOLAR SPETZLER 8 1.0 (NEUROSURGERY SUPPLIES) IMPLANT
GAUZE SPONGE 4X4 12PLY STRL (GAUZE/BANDAGES/DRESSINGS) ×2 IMPLANT
GAUZE SPONGE 4X4 12PLY STRL LF (GAUZE/BANDAGES/DRESSINGS) IMPLANT
GLOVE BIOGEL PI IND STRL 8 (GLOVE) ×2 IMPLANT
GLOVE SURG SYN 7.5 E (GLOVE) ×1 IMPLANT
GLOVE SURG SYN 7.5 PF PI (GLOVE) ×2 IMPLANT
GOWN STRL REUS W/ TWL LRG LVL3 (GOWN DISPOSABLE) ×2 IMPLANT
GOWN STRL REUS W/ TWL XL LVL3 (GOWN DISPOSABLE) ×2 IMPLANT
MARKER SKIN DUAL TIP RULER LAB (MISCELLANEOUS) IMPLANT
NDL HYPO 30GX1 BEV (NEEDLE) IMPLANT
NDL HYPO 30X.5 LL (NEEDLE) ×2 IMPLANT
NDL PRECISIONGLIDE 27X1.5 (NEEDLE) IMPLANT
NDL SAFETY ECLIPSE 18X1.5 (NEEDLE) ×2 IMPLANT
NEEDLE HYPO 30GX1 BEV (NEEDLE) IMPLANT
NEEDLE HYPO 30X.5 LL (NEEDLE) ×1 IMPLANT
NEEDLE PRECISIONGLIDE 27X1.5 (NEEDLE) IMPLANT
NS IRRIG 1000ML POUR BTL (IV SOLUTION) ×2 IMPLANT
PACK BASIN DAY SURGERY FS (CUSTOM PROCEDURE TRAY) ×2 IMPLANT
PENCIL SMOKE EVACUATOR (MISCELLANEOUS) ×2 IMPLANT
SHEET MEDIUM DRAPE 40X70 STRL (DRAPES) IMPLANT
SHIELD EYE MED CORNL SHD 22X21 (OPHTHALMIC RELATED) ×2 IMPLANT
SLEEVE SCD COMPRESS KNEE MED (STOCKING) IMPLANT
SUT ETHILON 6 0 P 1 (SUTURE) ×2 IMPLANT
SUT PLAIN 6 0 TG1408 (SUTURE) IMPLANT
SUT PLAIN GUT FAST 5-0 (SUTURE) ×2 IMPLANT
SUT VIC AB 4-0 PS2 18 (SUTURE) IMPLANT
SUT VIC AB 5-0 P-3 18X BRD (SUTURE) IMPLANT
SUT VICRYL RAPIDE 4-0 (SUTURE) IMPLANT
SYR 5ML LL (SYRINGE) ×2 IMPLANT
SYR 5ML LUER SLIP (SYRINGE) ×2 IMPLANT
SYR CONTROL 10ML LL (SYRINGE) IMPLANT
TOWEL GREEN STERILE FF (TOWEL DISPOSABLE) ×2 IMPLANT
TUBE CONNECTING 20X1/4 (TUBING) IMPLANT

## 2023-08-07 NOTE — Discharge Instructions (Addendum)
 Post-operative Patient Instructions after eyelid surgery [name] [name]. Hoshal MD  Surgery What to expect: Mild bruising and swelling is normal For the first few days after surgery apply ice packs to your eyelids; 20 minutes on , 20 minutes off throughout the day to reduce bruising  Recovery/Restrictions: -No strenuous activity for at least the first week after your procedure -Bruising and swelling are expected and will take weeks to go away (consider using ice packs) -Please contact our office immediately if you experience any signs/symptoms of infection (redness, pain, or fever of 100.17F or greater)  Wound Wound care:  1. Apply antibiotic ointment provided to you at surgery twice daily until your first postop visit.   Care Healing Period: For the best healing, please protect the area from the sun   You may be asked to begin massaging the scars several weeks after surgery. Scars can be massaged in horizontal, vertical, and circular motions.   Adult Post-Operative Pain Management  Pain medication is given immediately following your surgery to help with post-operative pain. Do not wake up or set an alarm to wake up and take pain medications. Sleep and rest.  Upon your discharge home, we suggest scheduled doses of Acetaminophen  (Tylenol ) every 6 hours and Ibuprofen  (Motrin ) every 6 hours, alternating between medications every 3 hours (i.e. Take Tylenol  and wait 3 hours, then take Motrin  and wait 3 hours, repeat) for the first 3-4 days after surgery. If you are without significant pain, medications can be taken more infrequently. It is important to follow dosing instructions on the medication bottle or prescription.   Sample of medication dosing schedule  Give dose of: Time: Given:  Acetaminophen  12 a.m.   Ibuprofen  3 a.m.   Acetaminophen  6 a.m.   Ibuprofen  9 a.m.   Acetaminophen  12 p.m.   Ibuprofen  3 p.m.   Acetaminophen  6 p.m.   Ibuprofen  9 p.m.    If you need to call after clinic  hours for a concern, call [phone] and ask for the "physician on call for ENT."  [address] [place], [place] [address] Phone: [phone]

## 2023-08-07 NOTE — Transfer of Care (Signed)
 Immediate Anesthesia Transfer of Care Note  Patient: [name] [name]  Procedure(s) Performed: BLEPHAROPLASTY (Bilateral: Eye)  Patient Location: PACU  Anesthesia Type:MAC  Level of Consciousness: awake, alert , and oriented  Airway & Oxygen  Therapy: Patient Spontanous Breathing and Patient connected to face mask oxygen   Post-op Assessment: Report given to RN and Post -op Vital signs reviewed and stable  Post vital signs: Reviewed and stable  Last Vitals:  Vitals Value Taken Time  BP 159/67 08/07/23 0945  Temp    Pulse 71 08/07/23 0947  Resp 16 08/07/23 0947  SpO2 97 % 08/07/23 0947  Vitals shown include unfiled device data.  Last Pain:  Vitals:   08/07/23 0806  TempSrc: Temporal  PainSc: 0-No pain      Patients Stated Pain Goal: 4 (08/07/23 0806)  Complications: No notable events documented.

## 2023-08-07 NOTE — Anesthesia Postprocedure Evaluation (Signed)
 Anesthesia Post Note  Patient: [name] [name]  Procedure(s) Performed: BLEPHAROPLASTY (Bilateral: Eye)     Patient location during evaluation: PACU Anesthesia Type: MAC Level of consciousness: awake and alert Pain management: pain level controlled Vital Signs Assessment: post-procedure vital signs reviewed and stable Respiratory status: spontaneous breathing, nonlabored ventilation and respiratory function stable Cardiovascular status: blood pressure returned to baseline Postop Assessment: no apparent nausea or vomiting Anesthetic complications: no   No notable events documented.  Last Vitals:  Vitals:   08/07/23 1036 08/07/23 1045  BP: (!) 153/63   Pulse: (!) 59   Resp: 18   Temp:  36.5 C  SpO2: 99%     Last Pain:  Vitals:   08/07/23 1036  TempSrc:   PainSc: 0-No pain                 [name]

## 2023-08-07 NOTE — H&P (Signed)
 [name] [name] is an 83 y.o. female.    Chief Complaint:  Upper eyelid dermatochalasis   HPI: Patient presents today for planned elective procedure.  He/she denies any interval change in history since office visit on 06/18/23.  Past Medical History:  Diagnosis Date   Anemia    Anxiety    Arthritis    Barrett's esophagus    seen on 2016 egd at [facility]   Depression    GERD (gastroesophageal reflux disease)    H/O hiatal hernia    Headache    Hypertension    Macular degeneration    Obesity    Pneumonia    PONV (postoperative nausea and vomiting)    Seasonal allergies    Sepsis (HCC) 10/02/2013    Past Surgical History:  Procedure Laterality Date   ABDOMINAL HYSTERECTOMY     ABDOMINAL SURGERY     APPENDECTOMY     BACK SURGERY     lumbar x2 by Dr Larrie Po   BIOPSY  09/03/2019   Procedure: BIOPSY;  Surgeon: Ruby Corporal, MD;  [facility];  Service: Endoscopy;;  esophagus   BIOPSY  08/21/2022   Procedure: BIOPSY;  Surgeon: Urban Garden, MD;  [facility];  Service: Gastroenterology;;   CARPAL TUNNEL RELEASE Right 03/2014   Dr. Murrel Arnt   CATARACT EXTRACTION W/PHACO Left 08/06/2013   Procedure: CATARACT EXTRACTION PHACO AND INTRAOCULAR LENS PLACEMENT LEFT EYE;  Surgeon: Anner Kill, MD;  Location: [facility];  Service: Ophthalmology;  Laterality: Left;  CDE: 9.55   CATARACT EXTRACTION W/PHACO Right 08/24/2013   Procedure: CATARACT EXTRACTION PHACO AND INTRAOCULAR LENS PLACEMENT (IOC);  Surgeon: Anner Kill, MD;  Location: [facility];  Service: Ophthalmology;  Laterality: Right;  CDE:8.30   CERVICAL FUSION     CHOLECYSTECTOMY     COLONOSCOPY N/A 11/03/2013   SLF: 1. Normal mucosa in the terminal ileum. 2. 13 colon polyps removed. 3. Moderate diverticulosis in the descending colon and sigmoid colon 4. the left colon is redundant 5. Small internal  hemorrhoids.   COLONOSCOPY N/A 09/25/2017   Procedure: COLONOSCOPY;  Surgeon: Ruby Corporal, MD;   [facility];  Service: Endoscopy;  Laterality: N/A;  1:55   COLONOSCOPY WITH PROPOFOL  N/A 04/29/2020   Castaneda: multiple polyps (tubular adenoma, sessile serrated, hyperplastic) ascending colon, transverse colon, descending colon, diverticulsos in sigmoid and descending colon, rectum/anal vergse normal   ESOPHAGOGASTRODUODENOSCOPY N/A 11/03/2013   SLF: 1. 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 Mild Non-erosive gastritis   ESOPHAGOGASTRODUODENOSCOPY  10/2014   [facility]: IMPRESSIONS: - likely small distal esophageal diverticulum without evidence of fistula, +barrett's esophagus without dysplasia. s/p gastric bypass   ESOPHAGOGASTRODUODENOSCOPY (EGD) WITH PROPOFOL  N/A 09/03/2019   rehman: normal hypopharynx, esophagus, mid esophagus, diverticulum in distal esophagus, short segment barretts esophagus, gastric bypass with small sized pouch, gastrojejunal anastomosis with healthy appearing mucosa, normal gastric body and jejunum   ESOPHAGOGASTRODUODENOSCOPY (EGD) WITH PROPOFOL  N/A 08/21/2022   Procedure: ESOPHAGOGASTRODUODENOSCOPY (EGD) WITH PROPOFOL ;  Surgeon: Urban Garden, MD;  [facility];  Service: Gastroenterology;  Laterality: N/A;  2:30am;asa 3   EYE SURGERY     GASTRIC BYPASS  1979   revision in 1980    HIATAL HERNIA REPAIR     [facility] in 2011   JOINT REPLACEMENT Right 1995   hip   JOINT REPLACEMENT Left 2008   hip   MEDIAL PARTIAL KNEE REPLACEMENT Left  Dr Genevive Ket   PARAESOPHAGEAL HERNIA REPAIR  SEP 2010 DR. MCNATT   POLYPECTOMY  09/25/2017   Procedure: POLYPECTOMY;  Surgeon: Ruby Corporal, MD;  [facility];  Service: Endoscopy;;  colon   POLYPECTOMY  04/29/2020   Procedure: POLYPECTOMY;  Surgeon: Urban Garden, MD;  [facility];  Service: Gastroenterology;;   SHOULDER ARTHROSCOPY Right    SHOULDER ARTHROSCOPY Right    x2   SKIN BIOPSY     TONSILLECTOMY      TOTAL KNEE ARTHROPLASTY Right 09/08/2018   Procedure: RIGHT TOTAL KNEE ARTHROPLASTY;  Surgeon: Adah Acron, MD;  [facility]: [facility];  Service: Orthopedics;  Laterality: Right;   TOTAL SHOULDER ARTHROPLASTY Right 02/16/2013   Procedure: TOTAL SHOULDER ARTHROPLASTY;  Surgeon: Adah Acron, MD;  Location: [facility];  Service: Orthopedics;  Laterality: Right;  Right Total Shoulder Arthroplasty, Cemented   WEIL OSTEOTOMY Right 07/18/2021   Procedure: RIGHT FOOT REVISION WEIL OSTEOTOMY 2ND AND 3RD METATARSAL, GASTROCNEMIUS RECESSION, PROXIMAL INTERPHALANGEAL RESECTION 2ND AND 3RD TOES;  Surgeon: Timothy Ford, MD;  Location: [facility];  Service: Orthopedics;  Laterality: Right;  regional with monitored anesthesia care    Family History  Problem Relation Age of Onset   Breast cancer Mother    Hypertension Father    Heart disease Father    Hypertension Sister    Colon cancer Brother        IN HIS 60s-METASTATIC   Hypertension Brother    Heart disease Brother    Colon polyps Paternal Grandfather     Social History:  reports that she has never smoked. She has been exposed to tobacco smoke. She has never used smokeless tobacco. She reports that she does not drink alcohol  and does not use drugs.  Allergies:  Allergies  Allergen Reactions   Gadavist  [Gadobutrol ] Itching and Swelling    Per patient    Novocain [Procaine Hcl] Other (See Comments)    Unknown-passed out with novocaine injected for dental surgery.   Other Hives and Other (See Comments)    EKG pads - need to use pediatric pads   Latex Rash   Penicillins Rash and Other (See Comments)    Has patient had a PCN reaction causing immediate rash, facial/tongue/throat swelling, SOB or lightheadedness with hypotension: Yes Has patient had a PCN reaction causing severe rash involving mucus membranes or skin necrosis: No Has patient had a PCN reaction that required hospitalization No Has patient had a PCN reaction occurring  within the last 10 years: No  If all of the above answers are "NO", then may proceed with Cephalosporin use.    Sulfa Antibiotics Hives and Rash    Medications Prior to Admission  Medication Sig Dispense Refill   acetaminophen  (TYLENOL ) 500 MG tablet Take 500-1,000 mg by mouth every 6 (six) hours as needed for moderate pain or headache.     alendronate (FOSAMAX) 70 MG tablet Take 70 mg by mouth once a week.     AZO-CRANBERRY PO Take 2 tablets by mouth daily as needed (kidney infections).      Calcium -Phosphorus-Vitamin D  (CITRACAL +D3 PO) Take 1 tablet by mouth daily.     Cholecalciferol  (VITAMIN D3) 50 MCG (2000 UT) capsule Take 2,000 Units by mouth daily.      conjugated estrogens  (PREMARIN ) vaginal cream Place 1 Applicatorful vaginally at bedtime. Use as directed at bedtime 30 g 12   docusate sodium  (COLACE) 100 MG capsule Take 100 mg by mouth at  bedtime as needed for mild constipation.      Ferrous Sulfate  (SLOW FE PO) Take 1 tablet by mouth every 7 (seven) days.     fluconazole  (DIFLUCAN ) 200 MG tablet Take 200 mg by mouth once a week.     Lactobacillus (FLORAJEN ACIDOPHILUS) CAPS Take 1 capsule by mouth daily before breakfast.     lisinopril  (ZESTRIL ) 10 MG tablet Take 10 mg by mouth every morning.     loratadine (CLARITIN) 10 MG tablet Take 10 mg by mouth daily.     omeprazole  (PRILOSEC) 20 MG capsule Take 1 capsule (20 mg total) by mouth daily. (Patient taking differently: Take 20 mg by mouth in the morning and at bedtime.) 90 capsule 3   ondansetron  (ZOFRAN ) 4 MG tablet Take 4 mg by mouth every 8 (eight) hours as needed for nausea or vomiting.     sertraline  (ZOLOFT ) 100 MG tablet Take 100 mg by mouth daily.     simethicone  (MYLICON) 125 MG chewable tablet Chew 125 mg by mouth every 6 (six) hours as needed for flatulence.     tobramycin-dexamethasone  (TOBRADEX) ophthalmic solution every 4 (four) hours while awake.     traZODone  (DESYREL ) 50 MG tablet Take 50 mg by mouth at bedtime.      albuterol  (PROVENTIL ) (2.5 MG/3ML) 0.083% nebulizer solution Take 3 mLs (2.5 mg total) by nebulization every 4 (four) hours as needed for wheezing. 75 mL 12   ketoconazole (NIZORAL) 2 % cream Apply 1 Application topically 2 (two) times daily as needed for irritation.     ketoconazole (NIZORAL) 2 % shampoo Apply 1 Application topically every 7 (seven) days.     NONFORMULARY OR COMPOUNDED ITEM [place] [facility] hemorrhoid cream. Called in refills 06/11/2023 p ov with Chelsea Carlan, Np.     Nystatin  (GERHARDT'S BUTT CREAM) CREA Apply 1 application topically 3 (three) times daily as needed for irritation. (Patient not taking: Reported on 06/11/2023) 1 each 11   nystatin  ointment (MYCOSTATIN ) Apply 1 application topically 2 (two) times daily as needed (for irritation). (Patient not taking: Reported on 06/11/2023)      No results found for this or any previous visit (from the past 48 hours). No results found.  ROS: negative other than stated in HPI  Blood pressure (!) 162/72, pulse 64, temperature 98 F (36.7 C), temperature source Temporal, resp. rate 15, height 5\' 3"  (1.6 m), weight 101.2 kg, SpO2 98%.  PHYSICAL EXAM: General: Resting comfortably in NAD  Lungs: Non-labored respiratinos  Studies Reviewed: none   Assessment/Plan Upper eyelid dermatochalasis   Proceed with bilateral upper eyelid blepharoplasty under MAC. Informed consent obtained. RBA discussed.    Electronically signed by:  Rush Coupe, MD  Staff Physician Facial Plastic & Reconstructive Surgery Otolaryngology - Head and Neck Surgery [facility]  08/07/2023, 8:28 AM

## 2023-08-07 NOTE — Op Note (Signed)
 OPERATIVE NOTE  [name] [name] Date/Time of Admission: 08/07/2023  7:20 AM  CSN: [id number];MRN:[id number] Attending Provider: Rush Coupe, MD Room/Bed: [facility]SP/NONE DOB: [birth year] Age: 83 y.o.   Pre-Op Diagnosis: Dermatochalasis of both upper eyelids  Post-Op Diagnosis: Dermatochalasis of both upper eyelids  Procedure: Procedure(s): BILATERAL UPPER EYELID BLEPHAROPLASTY (cpt [id number])  Anesthesia: Monitor Anesthesia Care  Surgeon(s): Marijane Shoulders, MD  Staff: Circulator: Royetta Cork, RN Scrub Person: Glynn Lasso, RN  Implants: * No implants in log *  Specimens: * No specimens in log *  Complications: NONE  EBL: minimal ML  IVF: Per anesthesia ML  Condition: stable  Operative Findings:  Severe upper eyelid dermatochalasis and brow ptosis   Description of Operation: The patient was identified in the pre-operative area and consent confirmed in the chart.  The patient was brought to the operating room by the anesthetist and a preoperative huddle was performed confirming the patient's identity and procedure to be performed.  Once all were in agreement we proceed with surgery.  MAC anesthesia was induced the patient was turned 90 degrees from anesthetist.  Preoperative timeout was performed.  The patient's eyes were anesthetized with topical 0.5% tetracaine  eyedrops.  Next standard blepharoplasty incisions were marked out in the supratarsal crease approximately 6 mm above the lash line bilaterally.  Brown forceps were used to form skin pinch technique to determine the amount of upper eyelid skin to remove.  Calipers were used to ensure at least 15 mm of upper eyelid skin was preserved bilaterally. The incisions were designed from the medial canthus into the crows feet rhytids.  There was greater removal on the left side due to worse dermatochalasis on the left side.  Local anesthetic 1% lidocaine  with epinephrine  and sodium bicarbonate was used to anesthetize  the eyelids.  The patient was prepped and draped in standard sterile fashion for procedure of this kind.  Starting on the right side a 15 blade was used to perform the skin only upper eyelid blepharoplasty excision.  After skin was removed with Wescott scissors Bovie was used for hemostasis.  A cooled ice pack was placed along the eyelid and we proceeded to address the left side.  A 15 blade was used to make the upper blepharoplasty incision and a Wescott scissor was used to excise the skin only flap.  Bovie was used for hemostasis and ice pack was applied.  Next we proceeded with closure.  Both eyes were reapproximated with two simple interrupted 6-0 nylon sutures followed by 6-0 plain gut running suture.  There was ease of eye closure at the end of the procedure.  The eyes were dressed with antibiotic ointment and eyelids were cleansed with balanced salt solution. Tobradex ointment was applied. Patient was then turned back to the anesthetist to awaken and brought in the recovery room in stable condition.    Marijane Shoulders, MD [facility]  08/07/2023

## 2023-08-08 ENCOUNTER — Encounter (HOSPITAL_BASED_OUTPATIENT_CLINIC_OR_DEPARTMENT_OTHER): Payer: Self-pay | Admitting: Otolaryngology

## 2023-08-08 MED ORDER — OXYCODONE HCL 5 MG PO TABS: 5.0000 mg | ORAL_TABLET | Freq: Four times a day (QID) | ORAL | 0 refills | Status: AC | PRN

## 2023-08-08 NOTE — Discharge Summary (Signed)
 Physician Discharge Summary  Patient ID: [name] [name] MRN: [id number] DOB/AGE: 1940-04-18 83 y.o.  Admit date: 08/07/2023 Discharge date: 08/08/2023  Admission Diagnoses:  Principal Problem:   Dermatochalasis of both upper eyelids   Discharge Diagnoses:  Same  Surgeries: Procedure(s): BLEPHAROPLASTY on 08/07/2023  Discharged Condition: Improved  Hospital Course: [name] [name] is an 83 y.o. female who was admitted 08/07/2023 with a chief complaint of No chief complaint on file. , and found to have a diagnosis of Dermatochalasis of both upper eyelids.  They were brought to the operating room on 08/07/2023 and underwent the above named procedures.    Physical Exam:  General: Awake and alert, no acute distress Eyelids with appropriate edema/ecchymosis.   Recent vital signs:  Vitals:   08/08/23 0500 08/08/23 0600  BP:  (!) 171/69  Pulse: 83 66  Resp:  16  Temp:  97.9 F (36.6 C)  SpO2: 99% 98%    Recent laboratory studies:  Results for orders placed or performed during the hospital encounter of 08/21/22  Surgical pathology   Collection Time: 08/21/22  3:59 PM  Result Value Ref Range   SURGICAL PATHOLOGY      SURGICAL PATHOLOGY CASE: [id number] PATIENT: [name] [name] Surgical Pathology Report     Clinical History: Barretts esophagus surveillance (crm)     FINAL MICROSCOPIC DIAGNOSIS:  A. ESOPHAGUS, BETWEEM 42 AND 43 CM, BIOPSY: -  Squamocolumnar junctional mucosa with intestinal metaplasia consistent with Barrett's esophagus in the appropriate endoscopic setting. -  Negative for dysplasia.    GROSS DESCRIPTION:  Received in formalin are tan, soft tissue fragments that are submitted in toto. Number: 4 size: 0.2 cm, each blocks: 1 (KW, 08/22/2022)    Final Diagnosis performed by Mark LeGolvan DO.   Electronically signed 08/23/2022 Technical component performed at [facility], [address], [place], [place] [address].  Professional  component performed at [facility], [address], [place], [place] [address].  Immunohistochemistry Technical component (if applicable) was performed at [facility].  [address], [place], [place] [address].   IMMUNOHISTOCHEMISTRY DISCLAIMER (if applicable): Some of these immunohistochemical stains may have been developed and the performance characteristics determine by [facility]. Some may not have been cleared or approved by the U.S. Food and Drug Administration. The FDA has determined that such clearance or approval is not necessary. This test is used for clinical purposes. It should not be regarded as investigational or for research. This laboratory is certified under the Clinical Laboratory Improvement Amendments of 1988 (CLIA-88) as qualified to perform high complexity clinical laboratory testing.  The controls stained appropriately.   IHC stains are performed on formalin fixed, paraffin embedded tissue using a 3,3"diaminobenzidine (DAB) chromogen and Leica Bond Autostainer System. The staining intensity of the nucleus is score manually and is reported as the percentage of tumor cell nuclei demonstrating  specific nuclear staining. The specimens are fixed in 10% Neutral Formalin for at least 6 hours and up to 72hrs. These tests are validated on decalcified tissue. Results should be interpreted with caution given the possibility of false negative results on decalcified specimens. Antibody Clones are as follows ER-clone 28F, PR-clone 16, Ki67- clone MM1. Some of these immunohistochemical stains may have been developed and the performance characteristics determined by [facility].     Discharge Medications:   Allergies as of 08/08/2023       Reactions   Gadavist  [gadobutrol ] Itching, Swelling   Per patient  Novocain [procaine Hcl] Other (See Comments)   Unknown-passed out with novocaine injected for  dental surgery.   Other Hives, Other (See Comments)   EKG pads - need to use pediatric pads   Latex Rash   Penicillins Rash, Other (See Comments)   Has patient had a PCN reaction causing immediate rash, facial/tongue/throat swelling, SOB or lightheadedness with hypotension: Yes Has patient had a PCN reaction causing severe rash involving mucus membranes or skin necrosis: No Has patient had a PCN reaction that required hospitalization No Has patient had a PCN reaction occurring within the last 10 years: No  If all of the above answers are "NO", then may proceed with Cephalosporin use.   Sulfa Antibiotics Hives, Rash        Medication List     TAKE these medications    acetaminophen  500 MG tablet Commonly known as: TYLENOL  Take 500-1,000 mg by mouth every 6 (six) hours as needed for moderate pain or headache.   albuterol  (2.5 MG/3ML) 0.083% nebulizer solution Commonly known as: PROVENTIL  Take 3 mLs (2.5 mg total) by nebulization every 4 (four) hours as needed for wheezing.   alendronate 70 MG tablet Commonly known as: FOSAMAX Take 70 mg by mouth once a week.   AZO-CRANBERRY PO Take 2 tablets by mouth daily as needed (kidney infections).   CITRACAL +D3 PO Take 1 tablet by mouth daily.   docusate sodium  100 MG capsule Commonly known as: COLACE Take 100 mg by mouth at bedtime as needed for mild constipation.   Florajen Acidophilus Caps Take 1 capsule by mouth daily before breakfast.   fluconazole  200 MG tablet Commonly known as: DIFLUCAN  Take 200 mg by mouth once a week.   Gerhardt's butt cream Crea Apply 1 application topically 3 (three) times daily as needed for irritation.   ketoconazole 2 % shampoo Commonly known as: NIZORAL Apply 1 Application topically every 7 (seven) days.   ketoconazole 2 % cream Commonly known as: NIZORAL Apply 1 Application topically 2 (two) times daily as needed for irritation.   lisinopril  10 MG tablet Commonly known as:  ZESTRIL  Take 10 mg by mouth every morning.   loratadine 10 MG tablet Commonly known as: CLARITIN Take 10 mg by mouth daily.   NONFORMULARY OR COMPOUNDED ITEM [place] [facility] hemorrhoid cream. Called in refills 06/11/2023 p ov with Chelsea Carlan, Np.   nystatin  ointment Commonly known as: MYCOSTATIN  Apply 1 application topically 2 (two) times daily as needed (for irritation).   omeprazole  20 MG capsule Commonly known as: PRILOSEC Take 1 capsule (20 mg total) by mouth daily. What changed: when to take this   ondansetron  4 MG tablet Commonly known as: ZOFRAN  Take 4 mg by mouth every 8 (eight) hours as needed for nausea or vomiting.   oxyCODONE  5 MG immediate release tablet Commonly known as: Oxy IR/ROXICODONE  Take 1 tablet (5 mg total) by mouth every 6 (six) hours as needed for up to 5 days for breakthrough pain.   Premarin  vaginal cream Generic drug: conjugated estrogens  Place 1 Applicatorful vaginally at bedtime. Use as directed at bedtime   sertraline  100 MG tablet Commonly known as: ZOLOFT  Take 100 mg by mouth daily.   simethicone  125 MG chewable tablet Commonly known as: MYLICON Chew 125 mg by mouth every 6 (six) hours as needed for flatulence.   SLOW FE PO Take 1 tablet by mouth every 7 (seven) days.   tobramycin-dexamethasone  ophthalmic solution Commonly known as: TOBRADEX every 4 (four) hours while awake.  traZODone  50 MG tablet Commonly known as: DESYREL  Take 50 mg by mouth at bedtime.   Vitamin D3 50 MCG (2000 UT) capsule Take 2,000 Units by mouth daily.        Diagnostic Studies: No results found.  Disposition: Discharge disposition: 01-Home or Self Care       Discharge Instructions     Discharge patient   Complete by: As directed    Discharge disposition: 01-Home or Self Care   Discharge patient date: 08/08/2023          Signed: Rush Coupe 08/08/2023, 7:20 AM

## 2023-08-15 ENCOUNTER — Telehealth: Payer: Self-pay

## 2023-08-15 ENCOUNTER — Ambulatory Visit: Admitting: Plastic Surgery

## 2023-08-15 ENCOUNTER — Encounter: Payer: Self-pay | Admitting: Plastic Surgery

## 2023-08-15 VITALS — BP 163/80 | HR 78 | Ht 63.0 in | Wt 222.0 lb

## 2023-08-15 DIAGNOSIS — N6481 Ptosis of breast: Secondary | ICD-10-CM | POA: Diagnosis not present

## 2023-08-15 DIAGNOSIS — M546 Pain in thoracic spine: Secondary | ICD-10-CM

## 2023-08-15 DIAGNOSIS — N6489 Other specified disorders of breast: Secondary | ICD-10-CM

## 2023-08-15 DIAGNOSIS — N62 Hypertrophy of breast: Secondary | ICD-10-CM | POA: Diagnosis not present

## 2023-08-15 NOTE — Telephone Encounter (Signed)
 Faxed the Request for Surgical Clearance form, as well as a request for the most recent mammogram report to patient's PCP: Dr. Vallarie Gauze with [facility], with confirmed receipt:   Phone: [phone] Fax: [phone]

## 2023-08-15 NOTE — Progress Notes (Signed)
 Referring Provider Burdine, Maceo Sax, MD [address] [place],  [place] [address]   CC:  Chief Complaint  Patient presents with   Advice Only   Skin Problem      [name] [name] is an 83 y.o. female.  HPI: [name] is a very pleasant 83 year old female who is referred by her primary care physician for consideration of breast reduction.  Patient states that she has always had large breast however they have recently become uncomfortable because of the persistent rashes in the inframammary folds as well as upper back and neck pain.  She has tried multiple topical agents and her primary care physician has prescribed antifungal treatments which have only been partially successful.  She would primarily like to have her breasts lifted off of her chest so that there is less contact between the chest and the posterior aspect of the breast.  She denies any other issues with her breast.  She had a mammogram recently which is not available we will send a copy of the results.  Of note the patient does live alone has no family to assist with her care.  She denies any other significant medical problems.  She is not currently on any blood thinners.  She did have gastric bypass surgery in the early 80s and is lost over 100 pounds from that surgery.  Now  Allergies  Allergen Reactions   Gadavist  [Gadobutrol ] Itching and Swelling    Per patient    Novocain [Procaine Hcl] Other (See Comments)    Unknown-passed out with novocaine injected for dental surgery.   Other Hives and Other (See Comments)    EKG pads - need to use pediatric pads   Latex Rash   Penicillins Rash and Other (See Comments)    Has patient had a PCN reaction causing immediate rash, facial/tongue/throat swelling, SOB or lightheadedness with hypotension: Yes Has patient had a PCN reaction causing severe rash involving mucus membranes or skin necrosis: No Has patient had a PCN reaction that required hospitalization No Has patient had a PCN  reaction occurring within the last 10 years: No  If all of the above answers are NO, then may proceed with Cephalosporin use.    Sulfa Antibiotics Hives and Rash    Outpatient Encounter Medications as of 08/15/2023  Medication Sig Note   acetaminophen  (TYLENOL ) 500 MG tablet Take 500-1,000 mg by mouth every 6 (six) hours as needed for moderate pain or headache.    albuterol  (PROVENTIL ) (2.5 MG/3ML) 0.083% nebulizer solution Take 3 mLs (2.5 mg total) by nebulization every 4 (four) hours as needed for wheezing.    alendronate (FOSAMAX) 70 MG tablet Take 70 mg by mouth once a week.    AZO-CRANBERRY PO Take 2 tablets by mouth daily as needed (kidney infections).     Calcium -Phosphorus-Vitamin D  (CITRACAL +D3 PO) Take 1 tablet by mouth daily.    Cholecalciferol  (VITAMIN D3) 50 MCG (2000 UT) capsule Take 2,000 Units by mouth daily.     conjugated estrogens  (PREMARIN ) vaginal cream Place 1 Applicatorful vaginally at bedtime. Use as directed at bedtime    docusate sodium  (COLACE) 100 MG capsule Take 100 mg by mouth at bedtime as needed for mild constipation.     Ferrous Sulfate  (SLOW FE PO) Take 1 tablet by mouth every 7 (seven) days.    fluconazole  (DIFLUCAN ) 200 MG tablet Take 200 mg by mouth once a week.    ketoconazole (NIZORAL) 2 % cream Apply 1 Application topically 2 (  two) times daily as needed for irritation.    ketoconazole (NIZORAL) 2 % shampoo Apply 1 Application topically every 7 (seven) days.    Lactobacillus (FLORAJEN ACIDOPHILUS) CAPS Take 1 capsule by mouth daily before breakfast.    lisinopril  (ZESTRIL ) 10 MG tablet Take 10 mg by mouth every morning.    loratadine (CLARITIN) 10 MG tablet Take 10 mg by mouth daily.    NONFORMULARY OR COMPOUNDED ITEM [facility] hemorrhoid cream. Called in refills 06/11/2023 p ov with Chelsea Carlan, Np.    Nystatin  (GERHARDT'S BUTT CREAM) CREA Apply 1 application topically 3 (three) times daily as needed for irritation. (Patient not taking:  Reported on 06/11/2023)    nystatin  ointment (MYCOSTATIN ) Apply 1 application topically 2 (two) times daily as needed (for irritation). (Patient not taking: Reported on 06/11/2023)    omeprazole  (PRILOSEC) 20 MG capsule Take 1 capsule (20 mg total) by mouth daily. (Patient taking differently: Take 20 mg by mouth in the morning and at bedtime.) 10/22/2022: Some days takes one and some days twice daily    ondansetron  (ZOFRAN ) 4 MG tablet Take 4 mg by mouth every 8 (eight) hours as needed for nausea or vomiting.    sertraline  (ZOLOFT ) 100 MG tablet Take 100 mg by mouth daily.    simethicone  (MYLICON) 125 MG chewable tablet Chew 125 mg by mouth every 6 (six) hours as needed for flatulence.    tobramycin -dexamethasone  (TOBRADEX ) ophthalmic solution every 4 (four) hours while awake.    traZODone  (DESYREL ) 50 MG tablet Take 50 mg by mouth at bedtime.    No facility-administered encounter medications on file as of 08/15/2023.     Past Medical History:  Diagnosis Date   Anemia    Anxiety    Arthritis    Barrett's esophagus    seen on 2016 egd at [facility]   Depression    GERD (gastroesophageal reflux disease)    H/O hiatal hernia    Headache    Hypertension    Macular degeneration    Obesity    Pneumonia    PONV (postoperative nausea and vomiting)    Seasonal allergies    Sepsis (HCC) 10/02/2013    Past Surgical History:  Procedure Laterality Date   ABDOMINAL HYSTERECTOMY     ABDOMINAL SURGERY     APPENDECTOMY     BACK SURGERY     lumbar x2 by Dr Larrie Po   BIOPSY  09/03/2019   Procedure: BIOPSY;  Surgeon: Ruby Corporal, MD;  [facility];  Service: Endoscopy;;  esophagus   BIOPSY  08/21/2022   Procedure: BIOPSY;  Surgeon: Urban Garden, MD;  [facility];  Service: Gastroenterology;;   Rosary Como LIFT Bilateral 08/07/2023   Procedure: BLEPHAROPLASTY;  Surgeon: Rush Coupe, MD;  Location: [facility];  Service: ENT;  Laterality: Bilateral;   BILATERAL UPPER EYELID BLEPHAROPLASTY   CARPAL TUNNEL RELEASE Right 03/2014   Dr. Murrel Arnt   CATARACT EXTRACTION W/PHACO Left 08/06/2013   Procedure: CATARACT EXTRACTION PHACO AND INTRAOCULAR LENS PLACEMENT LEFT EYE;  Surgeon: Anner Kill, MD;  Location: [facility];  Service: Ophthalmology;  Laterality: Left;  CDE: 9.55   CATARACT EXTRACTION W/PHACO Right 08/24/2013   Procedure: CATARACT EXTRACTION PHACO AND INTRAOCULAR LENS PLACEMENT (IOC);  Surgeon: Anner Kill, MD;  Location: [facility];  Service: Ophthalmology;  Laterality: Right;  CDE:8.30   CERVICAL FUSION     CHOLECYSTECTOMY     COLONOSCOPY N/A 11/03/2013   SLF: 1. Normal mucosa in the terminal ileum.  2. 13 colon polyps removed. 3. Moderate diverticulosis in the descending colon and sigmoid colon 4. the left colon is redundant 5. Small internal  hemorrhoids.   COLONOSCOPY N/A 09/25/2017   Procedure: COLONOSCOPY;  Surgeon: Ruby Corporal, MD;  [facility];  Service: Endoscopy;  Laterality: N/A;  1:55   COLONOSCOPY WITH PROPOFOL  N/A 04/29/2020   Castaneda: multiple polyps (tubular adenoma, sessile serrated, hyperplastic) ascending colon, transverse colon, descending colon, diverticulsos in sigmoid and descending colon, rectum/anal vergse normal   ESOPHAGOGASTRODUODENOSCOPY N/A 11/03/2013   SLF: 1. 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 Mild Non-erosive gastritis   ESOPHAGOGASTRODUODENOSCOPY  10/2014   [facility]: IMPRESSIONS: - likely small distal esophageal diverticulum without evidence of fistula, +barrett's esophagus without dysplasia. s/p gastric bypass   ESOPHAGOGASTRODUODENOSCOPY (EGD) WITH PROPOFOL  N/A 09/03/2019   rehman: normal hypopharynx, esophagus, mid esophagus, diverticulum in distal esophagus, short segment barretts esophagus, gastric bypass with small sized pouch, gastrojejunal anastomosis with healthy appearing mucosa, normal gastric body and jejunum    ESOPHAGOGASTRODUODENOSCOPY (EGD) WITH PROPOFOL  N/A 08/21/2022   Procedure: ESOPHAGOGASTRODUODENOSCOPY (EGD) WITH PROPOFOL ;  Surgeon: Urban Garden, MD;  [facility];  Service: Gastroenterology;  Laterality: N/A;  2:30am;asa 3   EYE SURGERY     GASTRIC BYPASS  1979   revision in 1980    HIATAL HERNIA REPAIR     [facility] in 2011   JOINT REPLACEMENT Right 1995   hip   JOINT REPLACEMENT Left 2008   hip   MEDIAL PARTIAL KNEE REPLACEMENT Left    Dr Genevive Ket   PARAESOPHAGEAL HERNIA REPAIR  SEP 2010 DR. MCNATT   POLYPECTOMY  09/25/2017   Procedure: POLYPECTOMY;  Surgeon: Ruby Corporal, MD;  [facility];  Service: Endoscopy;;  colon   POLYPECTOMY  04/29/2020   Procedure: POLYPECTOMY;  Surgeon: Urban Garden, MD;  [facility];  Service: Gastroenterology;;   SHOULDER ARTHROSCOPY Right    SHOULDER ARTHROSCOPY Right    x2   SKIN BIOPSY     TONSILLECTOMY     TOTAL KNEE ARTHROPLASTY Right 09/08/2018   Procedure: RIGHT TOTAL KNEE ARTHROPLASTY;  Surgeon: Adah Acron, MD;  [facility];  Service: Orthopedics;  Laterality: Right;   TOTAL SHOULDER ARTHROPLASTY Right 02/16/2013   Procedure: TOTAL SHOULDER ARTHROPLASTY;  Surgeon: Adah Acron, MD;  Location[facility];  Service: Orthopedics;  Laterality: Right;  Right Total Shoulder Arthroplasty, Cemented   WEIL OSTEOTOMY Right 07/18/2021   Procedure: RIGHT FOOT REVISION WEIL OSTEOTOMY 2ND AND 3RD METATARSAL, GASTROCNEMIUS RECESSION, PROXIMAL INTERPHALANGEAL RESECTION 2ND AND 3RD TOES;  Surgeon: Timothy Ford, MD;  Location: [facility];  Service: Orthopedics;  Laterality: Right;  regional with monitored anesthesia care    Family History  Problem Relation Age of Onset   Breast cancer Mother    Hypertension Father    Heart disease Father    Hypertension Sister    Colon cancer Brother        IN HIS 60s-METASTATIC   Hypertension Brother    Heart disease Brother    Colon polyps  Paternal Grandfather     Social History   Social History Narrative   12/26/21 lives alone     Review of Systems General: Denies fevers, chills, weight loss CV: Denies chest pain, shortness of breath, palpitations Breast: Patient complains of pendulous breasts which lie against her lower chest and upper abdomen and are frequently infected with  rashes that are quite painful.  She denies any other breast issues  Physical Exam    08/15/2023    9:44 AM 08/08/2023    6:00 AM 08/08/2023    5:00 AM  Vitals with BMI  Height 5' 3    Weight 222 lbs    BMI 39.34    Systolic 163 171   Diastolic 80 69   Pulse 78 66 83    General:  No acute distress,  Alert and oriented, Non-Toxic, Normal speech and affect Breast: Patient has bilateral pendulous, atrophic breasts with grade 3 ptosis.  There are no dominant masses on physical examination the nipples are normal in appearance without evidence of nipple discharge today.  Her sternal notch to nipple distance is 46 cm on the right and 47 cm on the left her nipple to fold distance on the right is 22 cm and 24 cm on the left.  She has pronounced asymmetry with the left breast larger and longer than the right. Mammogram: As noted she has a recent mammogram and we will obtain the results. Assessment/Plan Symptomatic macromastia: Patient is an 83 year old female who is referred for a breast reduction.  Her breasts are quite atrophic and primarily ptotic.  I believe that she would obtain some symptomatic relief with a breast reduction/breast lift.  I believe that I can remove 500 g per breast.  The patient understands that the surgery is not without risk.  This risk is both due to her age and frailty as well as the very long ptotic nature of her breasts.  She will very likely need a breast amputation with free nipple graft technique.  I discussed with her the location of the incisions.  I discussed with her the risks significant complications during the  surgery.  Prior to scheduling her surgery I will need very clear documentation of her cardiovascular fitness for surgery from her primary care doctor.  I will also ask her primary care provider to assist postoperative and home assistance since she does not have any family to provide care after the surgery.  Once her clearances have returned I will ask her to follow-up with me and we will discuss the way forward.  [name] [name] 08/15/2023, 10:38 AM

## 2023-08-24 DIAGNOSIS — Z882 Allergy status to sulfonamides status: Secondary | ICD-10-CM | POA: Diagnosis not present

## 2023-08-24 DIAGNOSIS — B372 Candidiasis of skin and nail: Secondary | ICD-10-CM | POA: Diagnosis not present

## 2023-08-24 DIAGNOSIS — L0390 Cellulitis, unspecified: Secondary | ICD-10-CM | POA: Diagnosis not present

## 2023-08-24 DIAGNOSIS — R350 Frequency of micturition: Secondary | ICD-10-CM | POA: Diagnosis not present

## 2023-08-24 DIAGNOSIS — N390 Urinary tract infection, site not specified: Secondary | ICD-10-CM | POA: Diagnosis not present

## 2023-08-24 DIAGNOSIS — I10 Essential (primary) hypertension: Secondary | ICD-10-CM | POA: Diagnosis not present

## 2023-08-24 DIAGNOSIS — R300 Dysuria: Secondary | ICD-10-CM | POA: Diagnosis not present

## 2023-08-24 DIAGNOSIS — Z880 Allergy status to penicillin: Secondary | ICD-10-CM | POA: Diagnosis not present

## 2023-08-24 DIAGNOSIS — R21 Rash and other nonspecific skin eruption: Secondary | ICD-10-CM | POA: Diagnosis not present

## 2023-08-24 DIAGNOSIS — Z91040 Latex allergy status: Secondary | ICD-10-CM | POA: Diagnosis not present

## 2023-08-24 DIAGNOSIS — L22 Diaper dermatitis: Secondary | ICD-10-CM | POA: Diagnosis not present

## 2023-08-26 ENCOUNTER — Telehealth: Payer: Self-pay

## 2023-08-26 NOTE — Telephone Encounter (Signed)
 Re-faxed the request for patient's most recent mammogram report.

## 2023-08-28 DIAGNOSIS — H02834 Dermatochalasis of left upper eyelid: Secondary | ICD-10-CM | POA: Diagnosis not present

## 2023-08-28 DIAGNOSIS — H02831 Dermatochalasis of right upper eyelid: Secondary | ICD-10-CM | POA: Diagnosis not present

## 2023-08-28 DIAGNOSIS — H43813 Vitreous degeneration, bilateral: Secondary | ICD-10-CM | POA: Diagnosis not present

## 2023-08-28 DIAGNOSIS — H35433 Paving stone degeneration of retina, bilateral: Secondary | ICD-10-CM | POA: Diagnosis not present

## 2023-08-28 DIAGNOSIS — H353231 Exudative age-related macular degeneration, bilateral, with active choroidal neovascularization: Secondary | ICD-10-CM | POA: Diagnosis not present

## 2023-09-03 DIAGNOSIS — N183 Chronic kidney disease, stage 3 unspecified: Secondary | ICD-10-CM | POA: Diagnosis not present

## 2023-09-03 DIAGNOSIS — Z13220 Encounter for screening for lipoid disorders: Secondary | ICD-10-CM | POA: Diagnosis not present

## 2023-09-03 DIAGNOSIS — D649 Anemia, unspecified: Secondary | ICD-10-CM | POA: Diagnosis not present

## 2023-09-03 DIAGNOSIS — Z1329 Encounter for screening for other suspected endocrine disorder: Secondary | ICD-10-CM | POA: Diagnosis not present

## 2023-09-03 DIAGNOSIS — I10 Essential (primary) hypertension: Secondary | ICD-10-CM | POA: Diagnosis not present

## 2023-09-12 ENCOUNTER — Telehealth: Payer: Self-pay

## 2023-09-12 DIAGNOSIS — Z1331 Encounter for screening for depression: Secondary | ICD-10-CM | POA: Diagnosis not present

## 2023-09-12 DIAGNOSIS — Z6839 Body mass index (BMI) 39.0-39.9, adult: Secondary | ICD-10-CM | POA: Diagnosis not present

## 2023-09-12 DIAGNOSIS — F339 Major depressive disorder, recurrent, unspecified: Secondary | ICD-10-CM | POA: Diagnosis not present

## 2023-09-12 DIAGNOSIS — M159 Polyosteoarthritis, unspecified: Secondary | ICD-10-CM | POA: Diagnosis not present

## 2023-09-12 DIAGNOSIS — D696 Thrombocytopenia, unspecified: Secondary | ICD-10-CM | POA: Diagnosis not present

## 2023-09-12 DIAGNOSIS — I10 Essential (primary) hypertension: Secondary | ICD-10-CM

## 2023-09-12 DIAGNOSIS — Z1389 Encounter for screening for other disorder: Secondary | ICD-10-CM | POA: Diagnosis not present

## 2023-09-12 DIAGNOSIS — Z9189 Other specified personal risk factors, not elsewhere classified: Secondary | ICD-10-CM | POA: Diagnosis not present

## 2023-09-12 DIAGNOSIS — Z0001 Encounter for general adult medical examination with abnormal findings: Secondary | ICD-10-CM | POA: Diagnosis not present

## 2023-09-13 DIAGNOSIS — N1832 Chronic kidney disease, stage 3b: Secondary | ICD-10-CM | POA: Diagnosis not present

## 2023-09-13 DIAGNOSIS — M159 Polyosteoarthritis, unspecified: Secondary | ICD-10-CM | POA: Diagnosis not present

## 2023-09-13 DIAGNOSIS — I129 Hypertensive chronic kidney disease with stage 1 through stage 4 chronic kidney disease, or unspecified chronic kidney disease: Secondary | ICD-10-CM | POA: Diagnosis not present

## 2023-09-13 DIAGNOSIS — F339 Major depressive disorder, recurrent, unspecified: Secondary | ICD-10-CM | POA: Diagnosis not present

## 2023-09-13 DIAGNOSIS — I8392 Asymptomatic varicose veins of left lower extremity: Secondary | ICD-10-CM | POA: Diagnosis not present

## 2023-09-19 ENCOUNTER — Telehealth: Payer: Self-pay | Admitting: *Deleted

## 2023-09-19 NOTE — Progress Notes (Signed)
 Complex Care Management Note Care Guide Note  09/19/2023 Name: [name] [name] MRN: [id number] DOB: 16-Dec-1940   Complex Care Management Outreach Attempts: An unsuccessful telephone outreach was attempted today to offer the patient information about available complex care management services.  Follow Up Plan:  Additional outreach attempts will be made to offer the patient complex care management information and services.   Encounter Outcome:  No Answer  .j

## 2023-09-20 ENCOUNTER — Telehealth: Payer: Self-pay | Admitting: *Deleted

## 2023-09-20 NOTE — Progress Notes (Signed)
 Complex Care Management Note Care Guide Note  09/20/2023 Name: [name] [name] MRN: [id number] DOB: Sep 06, 1940   Complex Care Management Outreach Attempts: An unsuccessful telephone outreach was attempted today to offer the patient information about available complex care management services.  Follow Up Plan:  Additional outreach attempts will be made to offer the patient complex care management information and services.   Encounter Outcome:  No Answer  [facility] Health[facility] Health Care Guide  Direct Dial:[phone] Fax:[phone] Website: [email or web address]

## 2023-09-23 ENCOUNTER — Telehealth: Payer: Self-pay | Admitting: *Deleted

## 2023-09-23 NOTE — Progress Notes (Signed)
 Complex Care Management Note Care Guide Note  09/23/2023 Name: [name] [name] MRN: [id number] DOB: [birth year]  [name] is a 83 y.o. year old female who is a primary care patient of Burdine, Elspeth FORBES, MD . The community resource team was consulted for assistance with Food Insecurity  SDOH screenings and interventions completed:  Yes  Social Drivers of Health From This Encounter   Housing: Unknown (09/23/2023)   Housing Stability Vital Sign    Unable to Pay for Housing in the Last Year: No    Homeless in the Last Year: No  Transportation Needs: No Transportation Needs (09/23/2023)   PRAPARE - Transportation    Lack of Transportation (Medical): No    Lack of Transportation (Non-Medical): No  Utilities: Not At Risk (09/23/2023)   Utilities    Threatened with loss of utilities: No    SDOH Interventions Today    Flowsheet Row Most Recent Value  SDOH Interventions   Food Insecurity Interventions Intervention Not Indicated  [patient declined need for food needs help with prearartion says she is already on MOW waitlist]  Housing Interventions Intervention Not Indicated  Transportation Interventions Intervention Not Indicated  Utilities Interventions Intervention Not Indicated     Care guide performed the following interventions: Patient provided with information about care guide support team and interviewed to confirm resource needs.  Follow Up Plan:  No further follow up planned at this time. The patient has been provided with needed resources.  Encounter Outcome:  Patient Visit Completed Lindel Marcell Greenauer-Moran  [facility] HealthPopulation Health Care Guide  Direct [phone] [phone] Website: [email or web address]

## 2023-09-24 ENCOUNTER — Telehealth: Payer: Self-pay

## 2023-09-24 NOTE — Progress Notes (Signed)
 Complex Care Management Note  Care Guide Note 09/24/2023 Name: [name] [name] MRN: [id number] DOB: 1940-10-08  [name] [name] is a 83 y.o. year old female who sees Burdine, Elspeth FORBES, MD for primary care. I reached out to Richmond FORBES Borer by phone today to offer complex care management services.  Ms. Deland was given information about Complex Care Management services today including:   The Complex Care Management services include support from the care team which includes your Nurse Care Manager, Clinical Social Worker, or Pharmacist.  The Complex Care Management team is here to help remove barriers to the health concerns and goals most important to you. Complex Care Management services are voluntary, and the patient may decline or stop services at any time by request to their care team member.   Complex Care Management Consent Status: Patient agreed to services and verbal consent obtained.   Follow up plan:  Telephone appointment with complex care management team member scheduled for:  10/11/23 at 3:00 p.m.  Encounter Outcome:  Patient Scheduled  Dreama [facility]  [facility], [facility] Health Care Management Assistant Direct Dial: [phone]  Fax: [phone]

## 2023-10-11 ENCOUNTER — Telehealth: Admitting: *Deleted

## 2023-10-11 ENCOUNTER — Telehealth: Payer: Self-pay

## 2023-10-11 DIAGNOSIS — Z91040 Latex allergy status: Secondary | ICD-10-CM | POA: Diagnosis not present

## 2023-10-11 DIAGNOSIS — I10 Essential (primary) hypertension: Secondary | ICD-10-CM | POA: Diagnosis not present

## 2023-10-11 DIAGNOSIS — Z9884 Bariatric surgery status: Secondary | ICD-10-CM | POA: Diagnosis not present

## 2023-10-11 DIAGNOSIS — L03116 Cellulitis of left lower limb: Secondary | ICD-10-CM | POA: Diagnosis not present

## 2023-10-11 DIAGNOSIS — Z880 Allergy status to penicillin: Secondary | ICD-10-CM | POA: Diagnosis not present

## 2023-10-11 DIAGNOSIS — Z882 Allergy status to sulfonamides status: Secondary | ICD-10-CM | POA: Diagnosis not present

## 2023-10-11 DIAGNOSIS — M1990 Unspecified osteoarthritis, unspecified site: Secondary | ICD-10-CM | POA: Diagnosis not present

## 2023-10-11 NOTE — Patient Instructions (Signed)
 Richmond FORBES Borer - I am sorry I was unable to reach you today for our scheduled appointment. I work with Lari, Elspeth FORBES, MD and am calling to support your healthcare needs. Please contact me at [phone] at your earliest convenience. I look forward to speaking with you soon.   Thank you,   Heddy Shutter, RN, MSN, BSN, CCM [place]  [facility], Population Health Case Manager Phone: [phone]

## 2023-10-15 ENCOUNTER — Telehealth: Payer: Self-pay

## 2023-10-15 NOTE — Progress Notes (Signed)
 Complex Care Management Note  Care Guide Note 10/15/2023 Name: [name] [name] MRN: [id number] DOB: 1940-07-01  [name] [name] is a 83 y.o. year old female who sees Burdine, Elspeth FORBES, MD for primary care. I reached out to [place] FORBES Borer by phone today to offer complex care management services.  [name] was given information about Complex Care Management services today including:   The Complex Care Management services include support from the care team which includes your Nurse Care Manager, Clinical Social Worker, or Pharmacist.  The Complex Care Management team is here to help remove barriers to the health concerns and goals most important to you. Complex Care Management services are voluntary, and the patient may decline or stop services at any time by request to their care team member.   Complex Care Management Consent Status: Patient agreed to services and verbal consent obtained.   Follow up plan:  Telephone appointment with complex care management team member scheduled for:  10/29/23 @ 1 pm  Encounter Outcome:  Patient Scheduled  Leotis Rase [facility], [facility] Guide  Direct Dial: [phone]  Fax [phone]

## 2023-10-16 DIAGNOSIS — F339 Major depressive disorder, recurrent, unspecified: Secondary | ICD-10-CM | POA: Diagnosis not present

## 2023-10-16 DIAGNOSIS — N1832 Chronic kidney disease, stage 3b: Secondary | ICD-10-CM | POA: Diagnosis not present

## 2023-10-16 DIAGNOSIS — I129 Hypertensive chronic kidney disease with stage 1 through stage 4 chronic kidney disease, or unspecified chronic kidney disease: Secondary | ICD-10-CM | POA: Diagnosis not present

## 2023-10-16 DIAGNOSIS — M159 Polyosteoarthritis, unspecified: Secondary | ICD-10-CM | POA: Diagnosis not present

## 2023-10-16 DIAGNOSIS — I8392 Asymptomatic varicose veins of left lower extremity: Secondary | ICD-10-CM | POA: Diagnosis not present

## 2023-10-18 ENCOUNTER — Other Ambulatory Visit (HOSPITAL_COMMUNITY): Payer: Self-pay | Admitting: Family Medicine

## 2023-10-18 ENCOUNTER — Ambulatory Visit (HOSPITAL_COMMUNITY)
Admission: RE | Admit: 2023-10-18 | Discharge: 2023-10-18 | Disposition: A | Discharge status: Home / Self Care | Source: Ambulatory Visit | Attending: Family Medicine | Admitting: Family Medicine

## 2023-10-18 DIAGNOSIS — F339 Major depressive disorder, recurrent, unspecified: Secondary | ICD-10-CM | POA: Diagnosis not present

## 2023-10-18 DIAGNOSIS — R600 Localized edema: Secondary | ICD-10-CM | POA: Diagnosis not present

## 2023-10-18 DIAGNOSIS — M79605 Pain in left leg: Secondary | ICD-10-CM | POA: Diagnosis not present

## 2023-10-18 DIAGNOSIS — Z6839 Body mass index (BMI) 39.0-39.9, adult: Secondary | ICD-10-CM | POA: Diagnosis not present

## 2023-10-18 DIAGNOSIS — I10 Essential (primary) hypertension: Secondary | ICD-10-CM | POA: Diagnosis not present

## 2023-10-18 DIAGNOSIS — L03116 Cellulitis of left lower limb: Secondary | ICD-10-CM | POA: Diagnosis not present

## 2023-10-18 DIAGNOSIS — R2681 Unsteadiness on feet: Secondary | ICD-10-CM | POA: Diagnosis not present

## 2023-10-18 DIAGNOSIS — L0390 Cellulitis, unspecified: Secondary | ICD-10-CM | POA: Diagnosis not present

## 2023-10-19 DIAGNOSIS — N1832 Chronic kidney disease, stage 3b: Secondary | ICD-10-CM | POA: Diagnosis not present

## 2023-10-19 DIAGNOSIS — I129 Hypertensive chronic kidney disease with stage 1 through stage 4 chronic kidney disease, or unspecified chronic kidney disease: Secondary | ICD-10-CM | POA: Diagnosis not present

## 2023-10-19 DIAGNOSIS — I8392 Asymptomatic varicose veins of left lower extremity: Secondary | ICD-10-CM | POA: Diagnosis not present

## 2023-10-19 DIAGNOSIS — M159 Polyosteoarthritis, unspecified: Secondary | ICD-10-CM | POA: Diagnosis not present

## 2023-10-19 DIAGNOSIS — F339 Major depressive disorder, recurrent, unspecified: Secondary | ICD-10-CM | POA: Diagnosis not present

## 2023-10-22 DIAGNOSIS — I129 Hypertensive chronic kidney disease with stage 1 through stage 4 chronic kidney disease, or unspecified chronic kidney disease: Secondary | ICD-10-CM | POA: Diagnosis not present

## 2023-10-22 DIAGNOSIS — I10 Essential (primary) hypertension: Secondary | ICD-10-CM | POA: Diagnosis not present

## 2023-10-22 DIAGNOSIS — I8392 Asymptomatic varicose veins of left lower extremity: Secondary | ICD-10-CM | POA: Diagnosis not present

## 2023-10-22 DIAGNOSIS — Z1329 Encounter for screening for other suspected endocrine disorder: Secondary | ICD-10-CM | POA: Diagnosis not present

## 2023-10-22 DIAGNOSIS — N1832 Chronic kidney disease, stage 3b: Secondary | ICD-10-CM | POA: Diagnosis not present

## 2023-10-22 DIAGNOSIS — F339 Major depressive disorder, recurrent, unspecified: Secondary | ICD-10-CM | POA: Diagnosis not present

## 2023-10-22 DIAGNOSIS — L03116 Cellulitis of left lower limb: Secondary | ICD-10-CM | POA: Diagnosis not present

## 2023-10-22 DIAGNOSIS — M159 Polyosteoarthritis, unspecified: Secondary | ICD-10-CM | POA: Diagnosis not present

## 2023-10-22 DIAGNOSIS — Z6839 Body mass index (BMI) 39.0-39.9, adult: Secondary | ICD-10-CM | POA: Diagnosis not present

## 2023-10-23 DIAGNOSIS — H353221 Exudative age-related macular degeneration, left eye, with active choroidal neovascularization: Secondary | ICD-10-CM | POA: Diagnosis not present

## 2023-10-24 DIAGNOSIS — M159 Polyosteoarthritis, unspecified: Secondary | ICD-10-CM | POA: Diagnosis not present

## 2023-10-24 DIAGNOSIS — I8392 Asymptomatic varicose veins of left lower extremity: Secondary | ICD-10-CM | POA: Diagnosis not present

## 2023-10-24 DIAGNOSIS — F339 Major depressive disorder, recurrent, unspecified: Secondary | ICD-10-CM | POA: Diagnosis not present

## 2023-10-24 DIAGNOSIS — I129 Hypertensive chronic kidney disease with stage 1 through stage 4 chronic kidney disease, or unspecified chronic kidney disease: Secondary | ICD-10-CM | POA: Diagnosis not present

## 2023-10-24 DIAGNOSIS — N1832 Chronic kidney disease, stage 3b: Secondary | ICD-10-CM | POA: Diagnosis not present

## 2023-10-28 DIAGNOSIS — N1832 Chronic kidney disease, stage 3b: Secondary | ICD-10-CM | POA: Diagnosis not present

## 2023-10-28 DIAGNOSIS — I8392 Asymptomatic varicose veins of left lower extremity: Secondary | ICD-10-CM | POA: Diagnosis not present

## 2023-10-28 DIAGNOSIS — I129 Hypertensive chronic kidney disease with stage 1 through stage 4 chronic kidney disease, or unspecified chronic kidney disease: Secondary | ICD-10-CM | POA: Diagnosis not present

## 2023-10-28 DIAGNOSIS — M5416 Radiculopathy, lumbar region: Secondary | ICD-10-CM | POA: Diagnosis not present

## 2023-10-28 DIAGNOSIS — M159 Polyosteoarthritis, unspecified: Secondary | ICD-10-CM | POA: Diagnosis not present

## 2023-10-28 DIAGNOSIS — F339 Major depressive disorder, recurrent, unspecified: Secondary | ICD-10-CM | POA: Diagnosis not present

## 2023-10-28 DIAGNOSIS — M47812 Spondylosis without myelopathy or radiculopathy, cervical region: Secondary | ICD-10-CM | POA: Diagnosis not present

## 2023-10-29 ENCOUNTER — Other Ambulatory Visit: Payer: Self-pay

## 2023-10-29 ENCOUNTER — Telehealth: Payer: Self-pay

## 2023-10-29 NOTE — Patient Outreach (Signed)
 Complex Care Management   Visit Note  10/29/2023  Name:  [name] [name] MRN: [id number] DOB: 1940-05-08  Situation: Referral received for Complex Care Management related to HTN I obtained verbal consent from Patient.  Visit completed with Patient  on the phone. Main concern today is being assessed by Sports Medicine for swelling/redness in left lower extremity that she feels may be related to previous knee surgery.  Rates pain of 6/10 in left leg.    Background:   Past Medical History:  Diagnosis Date   Anemia    Anxiety    Arthritis    Barrett's esophagus    seen on 2016 egd at [facility]   Depression    GERD (gastroesophageal reflux disease)    H/O hiatal hernia    Headache    Hypertension    Macular degeneration    Obesity    Pneumonia    PONV (postoperative nausea and vomiting)    Seasonal allergies    Sepsis (HCC) 10/02/2013    Assessment: Patient Reported Symptoms:  Cognitive Cognitive Status: Alert and oriented to person, place, and time, Insightful and able to interpret abstract concepts, Normal speech and language skills      Neurological Neurological Review of Symptoms: No symptoms reported Neurological Self-Management Outcome: 4 (good)  HEENT HEENT Symptoms Reported: No symptoms reported HEENT Management Strategies: Medication therapy, Routine screening HEENT Self-Management Outcome: 4 (good) HEENT Comment: Goes to Ophtalmology for macular degeneration as ordered, receives eye injections to both eyes.    Cardiovascular Cardiovascular Symptoms Reported: Swelling in legs or feet (Swelling in left leg, she feels it may be related to previous knee surgery, has appt with Sports Med 11/12/23.)    Respiratory Respiratory Symptoms Reported: No symptoms reported    Endocrine Endocrine Symptoms Reported: No symptoms reported Is patient diabetic?: No    Gastrointestinal Gastrointestinal Symptoms Reported: No symptoms reported Additional Gastrointestinal Details: Bowel  Plan: By day three, she will take Miralax , takes a stool softener. Gastrointestinal Management Strategies: Diet modification, Medication therapy Gastrointestinal Self-Management Outcome: 4 (good)    Genitourinary Genitourinary Symptoms Reported: No symptoms reported    Integumentary Integumentary Symptoms Reported: No symptoms reported Additional Integumentary Details: Dr. Barbar took a place off behind right ear, healed at this time. Goes back to [facility] 11/28/23. Skin Management Strategies: Routine screening Skin Self-Management Outcome: 4 (good)  Musculoskeletal Musculoskelatal Symptoms Reviewed: Unsteady gait Additional Musculoskeletal Details: patient went to ED 10/11/23 for swelling/redness in left leg, she has finished antibiotics as prescribed, elevates leg when sitting, places ice to reduce swelling.  Has an appt with Sports Medicine on 11/12/23 for assessment.  She is aware to go back to ED for increased pain/redness/swelling. Musculoskeletal Management Strategies: Activity, Adequate rest Musculoskeletal Self-Management Outcome: 4 (good) Musculoskeletal Comment: Uses roll[place] Falls in the past year?: No    Psychosocial Psychosocial Symptoms Reported: No symptoms reported Additional Psychological Details: She Behavioral Health Comment: She likes to do puzzles to keep busy.   Quality of Family Relationships: helpful, involved, supportive Do you feel physically threatened by others?: No    10/29/2023    PHQ2-9 Depression Screening   Little interest or pleasure in doing things Several days  Feeling down, depressed, or hopeless Not at all  PHQ-2 - Total Score 1  Trouble falling or staying asleep, or sleeping too much    Feeling tired or having little energy    Poor appetite or overeating     Feeling bad about yourself - or that you are a failure  or have let yourself or your family down    Trouble concentrating on things, such as reading the newspaper or watching television     Moving or speaking so slowly that other people could have noticed.  Or the opposite - being so fidgety or restless that you have been moving around a lot more than usual    Thoughts that you would be better off dead, or hurting yourself in some way    PHQ2-9 Total Score    If you checked off any problems, how difficult have these problems made it for you to do your work, take care of things at home, or get along with other people    Depression Interventions/Treatment      There were no vitals filed for this visit.  Medications Reviewed Today     Reviewed by Lucian Santana LABOR, RN (Registered Nurse) on 10/29/23 at 1518  Med List Status: <None>   Medication Order Taking? Sig Documenting Provider Last Dose Status Informant  acetaminophen  (TYLENOL ) 500 MG tablet [id number] Yes Take 500-1,000 mg by mouth every 6 (six) hours as needed for moderate pain or headache. [provider]  Active Self  albuterol  (PROVENTIL ) (2.5 MG/3ML) 0.083% nebulizer solution [id number]  Take 3 mLs (2.5 mg total) by nebulization every 4 (four) hours as needed for wheezing. Antoinette Doe, MD  Active Self           Med Note BLASE, LONELL POUR   Fri Sep 23, 2015  8:21 PM)    alendronate (FOSAMAX) 70 MG tablet [id number] Yes Take 70 mg by mouth once a week. [provider]  Active Self  AZO-CRANBERRY PO [id number] Yes Take 2 tablets by mouth daily as needed (kidney infections).  [provider]  Active Self  Calcium -Phosphorus-Vitamin D  (CITRACAL +D3 PO) [id number] Yes Take 1 tablet by mouth daily. [provider]  Active Self  Cholecalciferol  (VITAMIN D3) 50 MCG (2000 UT) capsule [id number] Yes Take 2,000 Units by mouth daily.   Patient taking differently: Take 2,000 Units by mouth daily.    [provider]  Active Self  clobetasol (TEMOVATE) 0.05 % external solution [id number] Yes Apply 1 Application topically 2 (two) times daily. [provider]  Active   conjugated estrogens   (PREMARIN ) vaginal cream [id number] Yes Place 1 Applicatorful vaginally at bedtime. Use as directed at bedtime Jayne Vonn DEL, MD  Active Self  docusate sodium  (COLACE) 100 MG capsule [id number] Yes Take 100 mg by mouth at bedtime as needed for mild constipation.  [provider]  Active Self  Ferrous Sulfate  (SLOW FE PO) [id number]  Take 1 tablet by mouth every 7 (seven) days.  Patient not taking: Reported on 10/29/2023   [provider]  Active Self           Med Note ZARA, [facility] M   Tue Jul 03, 2022 10:57 AM)    fluconazole  (DIFLUCAN ) 200 MG tablet [id number] Yes Take 200 mg by mouth once a week.  Patient taking differently: Take 200 mg by mouth as needed.   [provider]  Active   ketoconazole (NIZORAL) 2 % cream [id number] Yes Apply 1 Application topically 2 (two) times daily as needed for irritation. [provider]  Active Self  ketoconazole (NIZORAL) 2 % shampoo [id number] Yes Apply 1 Application topically every 7 (seven) days. [provider]  Active Self  Lactobacillus (FLORAJEN ACIDOPHILUS) CAPS [id number] Yes Take 1 capsule by mouth daily before breakfast. [provider]  Active Self  lisinopril  (ZESTRIL ) 10 MG tablet [id number] Yes Take 10 mg by mouth every morning. [provider]  Active Self  loratadine (CLARITIN) 10 MG tablet [id number] Yes Take 10 mg by mouth daily. [provider]  Active   NONFORMULARY OR COMPOUNDED ITEM [id number]  [place] apoth hemorrhoid cream. Called in refills 06/11/2023 p ov with Mitzie Boettcher, Np. [provider]  Active   Nystatin  (GERHARDT'S BUTT CREAM) CREA [id number] Yes Apply 1 application topically 3 (three) times daily as needed for irritation. Jayne Vonn DEL, MD  Active Self  nystatin  ointment (MYCOSTATIN ) [id number] Yes Apply 1 application topically 2 (two) times daily as needed (for irritation). [provider]  Active Self  omeprazole  (PRILOSEC) 20 MG capsule  [id number] Yes Take 1 capsule (20 mg total) by mouth daily. Carlan, Chelsea L, NP  Active Self           Med Note ZARA, [facility] M   Mon Oct 22, 2022 11:52 AM) Some days takes one and some days twice daily   ondansetron  (ZOFRAN ) 4 MG tablet [id number] Yes Take 4 mg by mouth every 8 (eight) hours as needed for nausea or vomiting. [provider]  Active Self  sertraline  (ZOLOFT ) 100 MG tablet [id number] Yes Take 100 mg by mouth daily. [provider]  Active Self  simethicone  (MYLICON) 125 MG chewable tablet [id number] Yes Chew 125 mg by mouth every 6 (six) hours as needed for flatulence. [provider]  Active Self  tobramycin -dexamethasone  (TOBRADEX ) ophthalmic solution [id number] Yes every 4 (four) hours while awake. [provider]  Active   traZODone  (DESYREL ) 50 MG tablet [id number] Yes Take 50 mg by mouth at bedtime. [provider]  Active Self            Recommendation:   Specialty provider follow-up :Sports Medicine 11/12/23 for further assessment of swelling/redness in left lower leg, Ophthalmology 11/13/23 Reminded patient to take BP at least two hours after taking BP meds, to rest for 5-10 minutes, feet flat on floor, back against chair. Log results.  Report BP's that are trending above 140/90.  Follow low sodium diet, follow Plate Planner for portions of vegetables/protein/starch, cut down on sugary foods.   Follow Up Plan:   Telephone follow-up two weeks  Santana Stamp BSN, CCM [place]  [facility] Population Health RN Care Manager Direct Dial: [phone]  Fax: [phone]

## 2023-10-29 NOTE — Patient Instructions (Signed)
 Visit Information  Thank you for taking time to visit with me today. Please don't hesitate to contact me if I can be of assistance to you before our next scheduled appointment.  Our next appointment is by telephone on Monday, September 8th at 10:30am. Please call the care guide team at [phone] if you need to cancel or reschedule your appointment.   Following is a copy of your care plan:   Goals Addressed             This Visit's Progress    VBCI RN Care Plan       Problems:  Chronic Disease Management support and education needs related to HTN  Goal: Over the next 2 months the Patient will demonstrate Improved adherence to prescribed treatment plan for HTN as evidenced by Blood pressures will average less than 140/90   Interventions:   Hypertension Interventions: Last practice recorded BP readings:  BP Readings from Last 3 Encounters:  10/29/23 (!) 145/79  08/15/23 (!) 163/80  08/08/23 (!) 171/69   Most recent eGFR/CrCl: No results found for: EGFR  No components found for: CRCL  Reviewed medications with patient and discussed importance of compliance Advised patient, providing education and rationale, to monitor blood pressure daily and record, calling PCP for findings outside established parameters Assessed social determinant of health barriers  Patient Self-Care Activities:  Attend all scheduled provider appointments Call pharmacy for medication refills 3-7 days in advance of running out of medications Call provider office for new concerns or questions  Take medications as prescribed    Plan:  Telephone follow up appointment with care management team member scheduled for:  two weeks             A reminder to ALL patients/family/friends, please call the [place]  National Suicide Prevention Lifeline: 845-220-5230 or TTY: 604-821-9220 TTY 862-796-4683) to talk to a trained counselor if you are experiencing a Mental Health or Behavioral Health Crisis or  need someone to talk to.  The patient verbalized understanding of instructions, educational materials, and care plan provided today and agreed to receive a mailed copy of patient instructions, educational materials, and care plan.   Santana Stamp BSN, CCM [place]  VBCI Population Health RN Care Manager Direct Dial: [phone]  Fax: [phone]

## 2023-10-29 NOTE — Patient Outreach (Signed)
 Contacted Ms. [name] [name], due to [facility] scheduling conflict, Ms. [name] [name] agrees to changing telephone appt time for initial assessment to 2:30pm.

## 2023-10-30 DIAGNOSIS — Z6839 Body mass index (BMI) 39.0-39.9, adult: Secondary | ICD-10-CM | POA: Diagnosis not present

## 2023-10-30 DIAGNOSIS — M159 Polyosteoarthritis, unspecified: Secondary | ICD-10-CM | POA: Diagnosis not present

## 2023-10-30 DIAGNOSIS — F339 Major depressive disorder, recurrent, unspecified: Secondary | ICD-10-CM | POA: Diagnosis not present

## 2023-10-30 DIAGNOSIS — I10 Essential (primary) hypertension: Secondary | ICD-10-CM | POA: Diagnosis not present

## 2023-10-30 DIAGNOSIS — R2681 Unsteadiness on feet: Secondary | ICD-10-CM | POA: Diagnosis not present

## 2023-11-07 DIAGNOSIS — I129 Hypertensive chronic kidney disease with stage 1 through stage 4 chronic kidney disease, or unspecified chronic kidney disease: Secondary | ICD-10-CM | POA: Diagnosis not present

## 2023-11-07 DIAGNOSIS — N1832 Chronic kidney disease, stage 3b: Secondary | ICD-10-CM | POA: Diagnosis not present

## 2023-11-07 DIAGNOSIS — I8392 Asymptomatic varicose veins of left lower extremity: Secondary | ICD-10-CM | POA: Diagnosis not present

## 2023-11-07 DIAGNOSIS — L03116 Cellulitis of left lower limb: Secondary | ICD-10-CM | POA: Diagnosis not present

## 2023-11-07 DIAGNOSIS — M159 Polyosteoarthritis, unspecified: Secondary | ICD-10-CM | POA: Diagnosis not present

## 2023-11-07 DIAGNOSIS — F339 Major depressive disorder, recurrent, unspecified: Secondary | ICD-10-CM | POA: Diagnosis not present

## 2023-11-09 DIAGNOSIS — I129 Hypertensive chronic kidney disease with stage 1 through stage 4 chronic kidney disease, or unspecified chronic kidney disease: Secondary | ICD-10-CM | POA: Diagnosis not present

## 2023-11-09 DIAGNOSIS — N1832 Chronic kidney disease, stage 3b: Secondary | ICD-10-CM | POA: Diagnosis not present

## 2023-11-09 DIAGNOSIS — I8392 Asymptomatic varicose veins of left lower extremity: Secondary | ICD-10-CM | POA: Diagnosis not present

## 2023-11-09 DIAGNOSIS — L03116 Cellulitis of left lower limb: Secondary | ICD-10-CM | POA: Diagnosis not present

## 2023-11-09 DIAGNOSIS — F339 Major depressive disorder, recurrent, unspecified: Secondary | ICD-10-CM | POA: Diagnosis not present

## 2023-11-09 DIAGNOSIS — M159 Polyosteoarthritis, unspecified: Secondary | ICD-10-CM | POA: Diagnosis not present

## 2023-11-11 ENCOUNTER — Other Ambulatory Visit: Payer: Self-pay

## 2023-11-12 DIAGNOSIS — Z96659 Presence of unspecified artificial knee joint: Secondary | ICD-10-CM | POA: Diagnosis not present

## 2023-11-12 DIAGNOSIS — T8484XA Pain due to internal orthopedic prosthetic devices, implants and grafts, initial encounter: Secondary | ICD-10-CM | POA: Diagnosis not present

## 2023-11-12 DIAGNOSIS — Z96653 Presence of artificial knee joint, bilateral: Secondary | ICD-10-CM | POA: Diagnosis not present

## 2023-11-12 NOTE — Progress Notes (Signed)
 [name] [name] is in today as a new patient because for more than 3 years since we have seen her.  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  The left knee has been hurting her more than the right.  Pain and swelling and some instability.  She is walking with a walker.  She is in today for follow-up.  On examination 83 year old female in no acute distress.  She is alert and oriented x 3.  Exam of the head is normocephalic atraumatic.  Neck is supple no meningismus.  Musculoskeletal exam of the right knee swelling strength range of motion decreased due to pain, left knee swelling strength range of motion decreased due to pain.  Skin is warm dry intact no signs of infection.  Impression: 2 view x-ray taken today of both knees shows total knee replacement hardware intact no acute changes.  She is having some cellulitic changes of her lower legs which we will prescribe doxycycline  for her and she will follow-up with primary care if needed.  Her knee x-rays are doing fine and the only treatment going forward we would recommend would be home exercise program versus physical therapy.  Follow-up with us  as needed

## 2023-11-12 NOTE — Patient Instructions (Signed)
 Visit Information  Thank you for taking time to visit with me today. Please don't hesitate to contact me if I can be of assistance to you before our next scheduled appointment.  Your next care management appointment is by telephone on Monday, September 22nd at 11:30am with Josette Pellet, [facility]    Please call the care guide team at [phone] if you need to cancel, schedule, or reschedule an appointment.   A reminder to ALL patients/family/friends, please call the [place]  National Suicide Prevention Lifeline: 216 117 8675 or TTY: (684)426-2732 TTY 334 342 3297) to talk to a trained counselor if you are experiencing a Mental Health or Behavioral Health Crisis or need someone to talk to.  Santana Stamp BSN, CCM [place]  VBCI Population Health RN Care Manager Direct Dial: [phone]  Fax: [phone]

## 2023-11-12 NOTE — Patient Outreach (Addendum)
 Complex Care Management   Visit Note  11/12/2023  Name:  [name] [name] MRN: [id number] DOB: [birth year]  Situation: Referral received for Complex Care Management related to HTN. I obtained verbal consent from Patient.  Visit completed with Ms. Engen  on the phone.  Patient reports blood pressures have been improving, nurse from [facility] visited yesterday, BP was 140/69.  She continues to have left knee pain with swelling and redness, history of total left knee replacement years ago, she will be attending appointment with Sports Medicine to assess. Continues to receive nurse visits from [facility] (unsure how often).   Background:   Past Medical History:  Diagnosis Date   Anemia    Anxiety    Arthritis    Barrett's esophagus    seen on 2016 egd at [facility]   Depression    GERD (gastroesophageal reflux disease)    H/O hiatal hernia    Headache    Hypertension    Macular degeneration    Obesity    Pneumonia    PONV (postoperative nausea and vomiting)    Seasonal allergies    Sepsis (HCC) 10/02/2013    Assessment: Patient Reported Symptoms:  Cognitive Cognitive Status: No symptoms reported      Neurological Neurological Review of Symptoms: Not assessed    HEENT HEENT Symptoms Reported: Not assessed      Cardiovascular Cardiovascular Symptoms Reported: Swelling in legs or feet Cardiovascular Comment: Continues to have swelling in left leg, will be seeing Ortho MD tomorrow, believes the swelling is related to previous knee surgery.  Respiratory Respiratory Symptoms Reported: No symptoms reported    Endocrine Endocrine Symptoms Reported: Not assessed    Gastrointestinal Gastrointestinal Symptoms Reported: Not assessed      Genitourinary Genitourinary Symptoms Reported: Not assessed    Integumentary Integumentary Symptoms Reported: Skin changes Additional Integumentary Details: Reports warmth, redness to left knee. She is seeing Ortho MD tomorrow to assess, believes it  could be related to previous knee surgery.    Musculoskeletal Musculoskelatal Symptoms Reviewed: Unsteady gait Musculoskeletal Self-Management Outcome: 3 (uncertain)      Psychosocial Psychosocial Symptoms Reported: Not assessed          11/12/2023    PHQ2-9 Depression Screening   Little interest or pleasure in doing things    Feeling down, depressed, or hopeless    PHQ-2 - Total Score    Trouble falling or staying asleep, or sleeping too much    Feeling tired or having little energy    Poor appetite or overeating     Feeling bad about yourself - or that you are a failure or have let yourself or your family down    Trouble concentrating on things, such as reading the newspaper or watching television    Moving or speaking so slowly that other people could have noticed.  Or the opposite - being so fidgety or restless that you have been moving around a lot more than usual    Thoughts that you would be better off dead, or hurting yourself in some way    PHQ2-9 Total Score    If you checked off any problems, how difficult have these problems made it for you to do your work, take care of things at home, or get along with other people    Depression Interventions/Treatment      Vitals:   11/11/23 1045  BP: (!) 140/69    Medications Reviewed Today   Medications were not reviewed in this encounter  Recommendation:   Specialty provider follow-up Ortho/Sports Medicine 11/12/23 to assess knee pain.   Follow Up Plan:   Telephone follow-up two weeks with Josette Pellet, RNCM  Santana Stamp BSN, CCM [place]  [facility] Population Health RN Care Manager Direct Dial: [phone]  Fax: [phone]

## 2023-11-13 DIAGNOSIS — M5416 Radiculopathy, lumbar region: Secondary | ICD-10-CM | POA: Diagnosis not present

## 2023-11-15 DIAGNOSIS — F339 Major depressive disorder, recurrent, unspecified: Secondary | ICD-10-CM | POA: Diagnosis not present

## 2023-11-15 DIAGNOSIS — M159 Polyosteoarthritis, unspecified: Secondary | ICD-10-CM | POA: Diagnosis not present

## 2023-11-15 DIAGNOSIS — L03116 Cellulitis of left lower limb: Secondary | ICD-10-CM | POA: Diagnosis not present

## 2023-11-15 DIAGNOSIS — I129 Hypertensive chronic kidney disease with stage 1 through stage 4 chronic kidney disease, or unspecified chronic kidney disease: Secondary | ICD-10-CM | POA: Diagnosis not present

## 2023-11-15 DIAGNOSIS — N1832 Chronic kidney disease, stage 3b: Secondary | ICD-10-CM | POA: Diagnosis not present

## 2023-11-15 DIAGNOSIS — I8392 Asymptomatic varicose veins of left lower extremity: Secondary | ICD-10-CM | POA: Diagnosis not present

## 2023-11-20 DIAGNOSIS — H524 Presbyopia: Secondary | ICD-10-CM | POA: Diagnosis not present

## 2023-11-20 DIAGNOSIS — Z961 Presence of intraocular lens: Secondary | ICD-10-CM | POA: Diagnosis not present

## 2023-11-20 DIAGNOSIS — H5213 Myopia, bilateral: Secondary | ICD-10-CM | POA: Diagnosis not present

## 2023-11-20 DIAGNOSIS — H52223 Regular astigmatism, bilateral: Secondary | ICD-10-CM | POA: Diagnosis not present

## 2023-11-20 DIAGNOSIS — H353231 Exudative age-related macular degeneration, bilateral, with active choroidal neovascularization: Secondary | ICD-10-CM | POA: Diagnosis not present

## 2023-11-25 ENCOUNTER — Other Ambulatory Visit: Payer: Self-pay

## 2023-11-25 NOTE — Patient Instructions (Signed)
 Visit Information  Thank you for taking time to visit with me today. Please don't hesitate to contact me if I can be of assistance to you before our next scheduled appointment.  Your next care management appointment is by telephone on 12-23-2023 at 1:00 PM   Telephone follow-up in 1 month  Please call the care guide team at [phone] if you need to cancel, schedule, or reschedule an appointment.   Please call the Suicide and Crisis Lifeline: 988 call the [place]  National Suicide Prevention Lifeline: (218)682-9718 or TTY: 986 880 7247 TTY 936-725-4693) to talk to a trained counselor call 1-800-273-TALK (toll free, 24 hour hotline) call the [facility]: 657 249 0764 call 911 if you are experiencing a Mental Health or Behavioral Health Crisis or need someone to talk to. Hendricks Her RN, BSN  [place] VBCI-Population Health RN Case Information systems manager [phone]

## 2023-11-26 ENCOUNTER — Encounter: Admitting: Physician Assistant

## 2023-11-26 ENCOUNTER — Telehealth: Payer: Self-pay | Admitting: Physician Assistant

## 2023-11-26 DIAGNOSIS — L218 Other seborrheic dermatitis: Secondary | ICD-10-CM | POA: Diagnosis not present

## 2023-11-26 DIAGNOSIS — L304 Erythema intertrigo: Secondary | ICD-10-CM | POA: Diagnosis not present

## 2023-11-26 DIAGNOSIS — Z85828 Personal history of other malignant neoplasm of skin: Secondary | ICD-10-CM | POA: Diagnosis not present

## 2023-11-26 NOTE — Telephone Encounter (Signed)
 Called [name] [name] and left a VM to move her appointment, Honora is not in office on oct 6th

## 2023-11-29 DIAGNOSIS — F339 Major depressive disorder, recurrent, unspecified: Secondary | ICD-10-CM | POA: Diagnosis not present

## 2023-11-29 DIAGNOSIS — I8392 Asymptomatic varicose veins of left lower extremity: Secondary | ICD-10-CM | POA: Diagnosis not present

## 2023-11-29 DIAGNOSIS — I129 Hypertensive chronic kidney disease with stage 1 through stage 4 chronic kidney disease, or unspecified chronic kidney disease: Secondary | ICD-10-CM | POA: Diagnosis not present

## 2023-11-29 DIAGNOSIS — M159 Polyosteoarthritis, unspecified: Secondary | ICD-10-CM | POA: Diagnosis not present

## 2023-11-29 DIAGNOSIS — L03116 Cellulitis of left lower limb: Secondary | ICD-10-CM | POA: Diagnosis not present

## 2023-11-29 DIAGNOSIS — N1832 Chronic kidney disease, stage 3b: Secondary | ICD-10-CM | POA: Diagnosis not present

## 2023-12-09 ENCOUNTER — Encounter: Admitting: Physician Assistant

## 2023-12-09 ENCOUNTER — Ambulatory Visit: Admitting: Student

## 2023-12-09 VITALS — BP 155/79 | HR 76 | Ht 63.0 in | Wt 221.0 lb

## 2023-12-09 DIAGNOSIS — Z803 Family history of malignant neoplasm of breast: Secondary | ICD-10-CM

## 2023-12-09 DIAGNOSIS — N62 Hypertrophy of breast: Secondary | ICD-10-CM | POA: Diagnosis not present

## 2023-12-09 MED ORDER — TRAMADOL HCL 50 MG PO TABS: 50.0000 mg | ORAL_TABLET | Freq: Three times a day (TID) | ORAL | 0 refills | Status: DC | PRN

## 2023-12-09 MED ORDER — ONDANSETRON HCL 4 MG PO TABS: 4.0000 mg | ORAL_TABLET | Freq: Three times a day (TID) | ORAL | 0 refills | Status: DC | PRN

## 2023-12-09 NOTE — Addendum Note (Signed)
 Addended by: ANDRIS STAGGER on: 12/09/2023 04:01 PM   Modules accepted: Orders

## 2023-12-09 NOTE — Progress Notes (Addendum)
 Patient ID: [name] [name], female    DOB: [birth year], 83 y.o.   MRN: [id number]  Chief Complaint  Patient presents with   Pre-op Exam      ICD-10-CM   1. Macromastia  N62        History of Present Illness: [name] [name] is a 83 y.o.  female  with a history of macromastia.  She presents for preoperative evaluation for upcoming procedure, Bilateral Breast Reduction, scheduled for 12/31/2023 with Dr.  Waddell  Patient is accompanied by her friend at bedside.  The patient has had problems with anesthesia.  She reports that she has had some postoperative nausea and vomiting in the past.  Patient denies any personal history of breast cancer.  She reports her mother had breast cancer.  Patient states that she has had a heart murmur all of her life, but it has never caused her any issues and she is not bothered by it.  Patient states that she had a heart cath about 30 years ago which was normal.  Patient states that she is currently taking a baby aspirin  daily.  She denies taking this for any history of heart attack or stroke.  Patient reports she is not a smoker.  Patient denies taking any hormone replacement.  She denies any history of miscarriages.  Patient does report she did have blood clots in her legs in the past, but she did have surgery on her varicose veins and has not had any issues since.  She denies any history of PE.  She denies any family history of blood clots.  She denies any personal or family history of clotting diseases.  Patient denies any recent surgeries, traumas or infections.  She denies any history of stroke or heart attack.  She denies any history of Crohn's disease or ulcerative colitis, COPD or asthma.  She denies any history of cancer.  Per chart review, patient had CBC done back in August and her hemoglobin was 12.6 at the time.  She states that she takes ferrous sulfate  and I encouraged her to continue to take this leading up to surgery.  She expressed  understanding.  Per chart review, clearance was received from patient's PCP, Dr. Elspeth Messier.  Per Dr. Messier,  she has a moderate risk patient for this low risk surgical procedure.  She does have multiple risk factors including her age, obesity and hypertension.  Additional risk factors include chronic kidney disease (recent GFR of 48 in March 2025) and thrombocytopenia (platelet count 123 in March 2025)..  She recently had a cardiac stress test in January 2025 which was low risk and did not show any signs of ischemia.  Therefore I do not feel she needs any further cardiac testing prior to surgery.  Of note, she recently had a blepharoplasty procedure done without any complications.  In terms of medications, I see Plaquenil listed on the preop form.  I do not prescribe this medication and have no records from other specialists indicating who would be prescribing it.  Therefore I cannot give any guidance regarding Plaquenil.  I asked the patient about Plaquenil, she states that she is not taking this medication and does not believe she ever has.  Summary of Previous Visit: Patient was initially seen for initial consult by Dr. Waddell on 08/15/2023.  At this visit, patient reported that she always had large breasts, but however they became uncomfortable because of persistent rashes in the inframammary folds patient wanted to primarily  have her breast lifted off her chest so that there would be less contact between them and her chest and the posterior aspect of her breasts.  On exam, STN on the right was 46 cm and STN on the left was 47 cm.  It was estimated that 500 g could be removed from each breast.  Estimated excess breast tissue to be removed at time of surgery: 500 grams  Job: Does not work at this time  PMH Significant for: Hypertension, GERD, IBS, anemia, chronic kidney disease, thrombocytopenia   Past Medical History: Allergies: Allergies  Allergen Reactions   Gadavist  [Gadobutrol ]  Itching and Swelling    Per patient    Novocain [Procaine Hcl] Other (See Comments)    Unknown-passed out with novocaine injected for dental surgery.   Other Hives and Other (See Comments)    EKG pads - need to use pediatric pads   Latex Rash   Penicillins Rash and Other (See Comments)    Has patient had a PCN reaction causing immediate rash, facial/tongue/throat swelling, SOB or lightheadedness with hypotension: Yes Has patient had a PCN reaction causing severe rash involving mucus membranes or skin necrosis: No Has patient had a PCN reaction that required hospitalization No Has patient had a PCN reaction occurring within the last 10 years: No  If all of the above answers are NO, then may proceed with Cephalosporin use.    Sulfa Antibiotics Hives and Rash    Current Medications:  Current Outpatient Medications:    acetaminophen  (TYLENOL ) 500 MG tablet, Take 500-1,000 mg by mouth every 6 (six) hours as needed for moderate pain or headache., Disp: , Rfl:    albuterol  (PROVENTIL ) (2.5 MG/3ML) 0.083% nebulizer solution, Take 3 mLs (2.5 mg total) by nebulization every 4 (four) hours as needed for wheezing., Disp: 75 mL, Rfl: 12   alendronate (FOSAMAX) 70 MG tablet, Take 70 mg by mouth once a week., Disp: , Rfl:    aspirin  EC 81 MG tablet, Take 81 mg by mouth daily. Swallow whole., Disp: , Rfl:    AZO-CRANBERRY PO, Take 2 tablets by mouth daily as needed (kidney infections). , Disp: , Rfl:    Calcium -Phosphorus-Vitamin D  (CITRACAL +D3 PO), Take 1 tablet by mouth daily., Disp: , Rfl:    Cholecalciferol  (VITAMIN D3) 50 MCG (2000 UT) capsule, Take 2,000 Units by mouth daily. , Disp: , Rfl:    clobetasol (TEMOVATE) 0.05 % external solution, Apply 1 Application topically 2 (two) times daily., Disp: , Rfl:    conjugated estrogens  (PREMARIN ) vaginal cream, Place 1 Applicatorful vaginally at bedtime. Use as directed at bedtime, Disp: 30 g, Rfl: 12   docusate sodium  (COLACE) 100 MG capsule, Take 100  mg by mouth at bedtime as needed for mild constipation. , Disp: , Rfl:    Ferrous Sulfate  (SLOW FE PO), Take 1 tablet by mouth every 7 (seven) days., Disp: , Rfl:    fluconazole  (DIFLUCAN ) 200 MG tablet, Take 200 mg by mouth once a week., Disp: , Rfl:    ketoconazole (NIZORAL) 2 % cream, Apply 1 Application topically 2 (two) times daily as needed for irritation., Disp: , Rfl:    ketoconazole (NIZORAL) 2 % shampoo, Apply 1 Application topically every 7 (seven) days., Disp: , Rfl:    Lactobacillus (FLORAJEN ACIDOPHILUS) CAPS, Take 1 capsule by mouth daily before breakfast., Disp: , Rfl:    lisinopril  (ZESTRIL ) 10 MG tablet, Take 10 mg by mouth every morning., Disp: , Rfl:    loratadine (CLARITIN) 10  MG tablet, Take 10 mg by mouth daily., Disp: , Rfl:    magnesium  gluconate (MAGONATE) 500 (27 Mg) MG TABS tablet, Take 500 mg by mouth daily., Disp: , Rfl:    Nystatin  (GERHARDT'S BUTT CREAM) CREA, Apply 1 application topically 3 (three) times daily as needed for irritation., Disp: 1 each, Rfl: 11   nystatin  ointment (MYCOSTATIN ), Apply 1 application topically 2 (two) times daily as needed (for irritation)., Disp: , Rfl:    ondansetron  (ZOFRAN ) 4 MG tablet, Take 4 mg by mouth every 8 (eight) hours as needed for nausea or vomiting., Disp: , Rfl:    ondansetron  (ZOFRAN ) 4 MG tablet, Take 1 tablet (4 mg total) by mouth every 8 (eight) hours as needed for up to 15 doses for nausea or vomiting., Disp: 15 tablet, Rfl: 0   sertraline  (ZOLOFT ) 100 MG tablet, Take 100 mg by mouth daily., Disp: , Rfl:    simethicone  (MYLICON) 125 MG chewable tablet, Chew 125 mg by mouth every 6 (six) hours as needed for flatulence., Disp: , Rfl:    tobramycin -dexamethasone  (TOBRADEX ) ophthalmic solution, every 4 (four) hours while awake., Disp: , Rfl:    traMADol  (ULTRAM ) 50 MG tablet, Take 1 tablet (50 mg total) by mouth every 8 (eight) hours as needed for up to 15 doses for moderate pain (pain score 4-6) or severe pain (pain score  7-10)., Disp: 15 tablet, Rfl: 0   NONFORMULARY OR COMPOUNDED ITEM, [place] apoth hemorrhoid cream. Called in refills 06/11/2023 p ov with Chelsea Carlan, Np., Disp: , Rfl:    omeprazole  (PRILOSEC) 20 MG capsule, Take 1 capsule (20 mg total) by mouth daily., Disp: 90 capsule, Rfl: 3   traZODone  (DESYREL ) 50 MG tablet, Take 50 mg by mouth at bedtime., Disp: , Rfl:   Past Medical Problems: Past Medical History:  Diagnosis Date   Anemia    Anxiety    Arthritis    Barrett's esophagus    [facility] on [facility]d [facility]   Depression    GERD (gastroesophageal reflux disease)    H/O hiatal hernia    Headache    Hypertension    Macular degeneration    Obesity    Pneumonia    PONV (postoperative nausea and vomiting)    Seasonal allergies    Sepsis (HCC) 10/02/2013    Past Surgical History: Past Surgical History:  Procedure Laterality Date   ABDOMINAL HYSTERECTOMY     ABDOMINAL SURGERY     APPENDECTOMY     BACK SURGERY     lumbar x2 by Dr Mavis   BIOPSY  09/03/2019   Procedure: BIOPSY;  Surgeon: Golda Claudis PENNER, MD;  [facility];  Service: Endoscopy;;  esophagus   BIOPSY  08/21/2022   Procedure: BIOPSY;  Surgeon: Eartha Angelia Sieving, MD;  [facility]: [facility];  Service: Gastroenterology;;   DORY LIFT Bilateral 08/07/2023   Procedure: BLEPHAROPLASTY;  Surgeon: Luciano Standing, MD;  Location: [facility];  Service: ENT;  Laterality: Bilateral;  BILATERAL UPPER EYELID BLEPHAROPLASTY   CARPAL TUNNEL RELEASE Right 03/2014   Dr. Barbarann   CATARACT EXTRACTION W/PHACO Left 08/06/2013   Procedure: CATARACT EXTRACTION PHACO AND INTRAOCULAR LENS PLACEMENT LEFT EYE;  Surgeon: Cherene Mania, MD;  Location: [facility];  Service: Ophthalmology;  Laterality: Left;  CDE: 9.55   CATARACT EXTRACTION W/PHACO Right 08/24/2013   Procedure: CATARACT EXTRACTION PHACO AND INTRAOCULAR LENS PLACEMENT (IOC);  Surgeon: Cherene Mania, MD;  Location: [facility];  Service: Ophthalmology;  Laterality:  Right;  CDE:8.30   CERVICAL FUSION     CHOLECYSTECTOMY     COLONOSCOPY N/A 11/03/2013   SLF: 1. Normal mucosa in the terminal ileum. 2. 13 colon polyps removed. 3. Moderate diverticulosis in the descending colon and sigmoid colon 4. the left colon is redundant 5. Small internal  hemorrhoids.   COLONOSCOPY N/A 09/25/2017   Procedure: COLONOSCOPY;  Surgeon: Golda Claudis PENNER, MD;  [facility];  Service: Endoscopy;  Laterality: N/A;  1:55   COLONOSCOPY WITH PROPOFOL  N/A 04/29/2020   Castaneda: multiple polyps (tubular adenoma, sessile serrated, hyperplastic) ascending colon, transverse colon, descending colon, diverticulsos in sigmoid and descending colon, rectum/anal vergse normal   ESOPHAGOGASTRODUODENOSCOPY N/A 11/03/2013   SLF: 1. 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 Mild Non-erosive gastritis   ESOPHAGOGASTRODUODENOSCOPY  10/2014   [facility]: IMPRESSIONS: - likely small distal esophageal diverticulum without evidence of fistula, +barrett's esophagus without dysplasia. s/p gastric bypass   ESOPHAGOGASTRODUODENOSCOPY (EGD) WITH PROPOFOL  N/A 09/03/2019   rehman: normal hypopharynx, esophagus, mid esophagus, diverticulum in distal esophagus, short segment barretts esophagus, gastric bypass with small sized pouch, gastrojejunal anastomosis with healthy appearing mucosa, normal gastric body and jejunum   ESOPHAGOGASTRODUODENOSCOPY (EGD) WITH PROPOFOL  N/A 08/21/2022   Procedure: ESOPHAGOGASTRODUODENOSCOPY (EGD) WITH PROPOFOL ;  Surgeon: Eartha Angelia Sieving, MD;  [facility];  Service: Gastroenterology;  Laterality: N/A;  2:30am;asa 3   EYE SURGERY     GASTRIC BYPASS  1979   revision in 1980    HIATAL HERNIA REPAIR     [facility] in 2011   JOINT REPLACEMENT Right 1995   hip   JOINT REPLACEMENT Left 2008   hip   MEDIAL PARTIAL KNEE REPLACEMENT Left    Dr Rubie   PARAESOPHAGEAL HERNIA REPAIR  SEP 2010 DR. MCNATT    POLYPECTOMY  09/25/2017   Procedure: POLYPECTOMY;  Surgeon: Golda Claudis PENNER, MD;  [facility];  Service: Endoscopy;;  colon   POLYPECTOMY  04/29/2020   Procedure: POLYPECTOMY;  Surgeon: Eartha Angelia Sieving, MD;  [facility];  Service: Gastroenterology;;   SHOULDER ARTHROSCOPY Right    SHOULDER ARTHROSCOPY Right    x2   SKIN BIOPSY     TONSILLECTOMY     TOTAL KNEE ARTHROPLASTY Right 09/08/2018   Procedure: RIGHT TOTAL KNEE ARTHROPLASTY;  Surgeon: Barbarann Oneil BROCKS, MD;  Location: [facility];  Service: Orthopedics;  Laterality: Right;   TOTAL SHOULDER ARTHROPLASTY Right 02/16/2013   Procedure: TOTAL SHOULDER ARTHROPLASTY;  Surgeon: Oneil BROCKS Barbarann, MD;  Location: [facility];  Service: Orthopedics;  Laterality: Right;  Right Total Shoulder Arthroplasty, Cemented   WEIL OSTEOTOMY Right 07/18/2021   Procedure: RIGHT FOOT REVISION WEIL OSTEOTOMY 2ND AND 3RD METATARSAL, GASTROCNEMIUS RECESSION, PROXIMAL INTERPHALANGEAL RESECTION 2ND AND 3RD TOES;  Surgeon: Harden Jerona GAILS, MD;  Location: [facility];  Service: Orthopedics;  Laterality: Right;  regional with monitored anesthesia care    Social History: Social History   Socioeconomic History   Marital status: Single    Spouse name: Not on file   Number of children: 0   Years of education: Not on file   Highest education level: Some college, no degree  Occupational History   Not on file  Tobacco Use   Smoking status: Never    Passive exposure: Past   Smokeless tobacco: Never  Vaping Use   Vaping status: Never Used  Substance and Sexual Activity   Alcohol  use: No   Drug  use: No   Sexual activity: Yes    Birth control/protection: Surgical  Other Topics Concern   Not on file  Social History Narrative   12/26/21 lives alone   Social Drivers of Health   Financial Resource Strain: Not on file  Food Insecurity: No Food Insecurity (11/25/2023)   Hunger Vital Sign    Worried About Running Out of Food in the Last  Year: Never true    Ran Out of Food in the Last Year: Never true  Transportation Needs: No Transportation Needs (11/25/2023)   PRAPARE - Administrator, Civil Service (Medical): No    Lack of Transportation (Non-Medical): No  Physical Activity: Not on file  Stress: Not on file  Social Connections: Not on file  Intimate Partner Violence: Not At Risk (11/25/2023)   Humiliation, Afraid, Rape, and Kick questionnaire    Fear of Current or Ex-Partner: No    Emotionally Abused: No    Physically Abused: No    Sexually Abused: No    Family History: Family History  Problem Relation Age of Onset   Breast cancer Mother    Hypertension Father    Heart disease Father    Hypertension Sister    Colon cancer Brother        IN HIS 60s-METASTATIC   Hypertension Brother    Heart disease Brother    Colon polyps Paternal Grandfather     Review of Systems: Denies any recent fevers, chills or changes in her health  Physical Exam: Vital Signs BP (!) 155/79 (BP Location: Right Wrist, Patient Position: Sitting, Cuff Size: Small)   Pulse 76   Ht 5' 3 (1.6 m)   Wt 221 lb (100.2 kg)   SpO2 95%   BMI 39.15 kg/m   Physical Exam  Constitutional:      General: Not in acute distress.    Appearance: Normal appearance. Not ill-appearing.  HENT:     Head: Normocephalic and atraumatic.  Neck:     Musculoskeletal: Normal range of motion.  Cardiovascular:     Rate and Rhythm: Normal rate Pulmonary:     Effort: Pulmonary effort is normal. No respiratory distress.  Breasts: Reviewed the photos in the chart, patient has very large and pendulous breasts bilaterally Musculoskeletal: Normal range of motion.  Skin:    General: Skin is warm and dry.     Findings: No erythema or rash.  Neurological:     Mental Status: Alert and oriented to person, place, and time. Mental status is at baseline.  Psychiatric:        Mood and Affect: Mood normal.        Behavior: Behavior normal.     Assessment/Plan: The patient is scheduled for bilateral breast reduction with Dr. Waddell.  Risks, benefits, and alternatives of procedure discussed, questions answered and consent obtained.    Smoking Status: Non-smoker; Counseling Given?  N/A Last Mammogram: 05/27/2023; Results: Negative, there was no mammographic evidence of malignancy  Caprini Score: 10; Risk Factors include: Age, BMI greater than 25, history of blood clots, and length of planned surgery. Recommendation for mechanical and possible pharmacological prophylaxis. Encourage early ambulation.  Will discuss possibility of postoperative Lovenox  with Dr. Waddell.  Pictures obtained: @consult   Post-op Rx sent to pharmacy: Tramadol , Zofran -patient states that she has taken tramadol  in the past without any difficulties.  Instructed the patient to hold her aspirin  1 week prior to surgery.  Discussed with her to hold any multivitamins or supplements at least 1  week prior to surgery.  Discussed with her to hold her Fosamax the day of surgery as well as her lisinopril  the day of surgery.  I discussed with the patient that she should not take her trazodone  at the same time as tramadol  as these can be sedating.  Patient expressed understanding.  Patient was provided with the breast reduction and General Surgical Risk consent document and Pain Medication Agreement prior to their appointment.  They had adequate time to read through the risk consent documents and Pain Medication Agreement. We also discussed them in person together during this preop appointment. All of their questions were answered to their satisfaction.  Recommended calling if they have any further questions.  Risk consent form and Pain Medication Agreement to be scanned into patient's chart.  The risk that can be encountered with breast reduction were discussed and include the following but not limited to these:  Breast asymmetry, fluid accumulation, firmness of the breast,  inability to breast feed, loss of nipple or areola, skin loss, decrease or no nipple sensation, fat necrosis of the breast tissue, bleeding, infection, healing delay.  There are risks of anesthesia, changes to skin sensation and injury to nerves or blood vessels.  The muscle can be temporarily or permanently injured.  You may have an allergic reaction to tape, suture, glue, blood products which can result in skin discoloration, swelling, pain, skin lesions, poor healing.  Any of these can lead to the need for revisonal surgery or stage procedures.  A reduction has potential to interfere with diagnostic procedures.  Nipple or breast piercing can increase risks of infection.  This procedure is best done when the breast is fully developed.  Changes in the breast will continue to occur over time.  Pregnancy can alter the outcomes of previous breast reduction surgery, weight gain and weigh loss can also effect the long term appearance.   We discussed the possibility of amputation/free nipple graft technique due to the length of her STN.  She is understanding of the possibility that we would need to transition from a pedicle technique to a free nipple graft technique intraoperatively.  We discussed the risks associated with free nipple graft breast reductions, including but not limited to failure of the graft, partial loss of the graft, loss of sensation of bilateral nipple areola, complete loss of the nipple areola graft, inability to breast-feed, postoperative wounds, ongoing wound care.  We also discussed the risks associated with the pedicle technique.  We discussed that with the pedicle technique she could develop nipple areolar necrosis which would result in loss of the nipple, this would also result in ongoing wound care and possible changes in the shape of her breast.    The consent was obtained with risks and complications reviewed which included bleeding, pain, scar, infection and the risk of anesthesia.   The patients questions were answered to the patients expressed satisfaction.   ADDEND: I discussed clearance from patient's PCP with Dr. Waddell and he would like to hold off on surgery for now and would like the patient to follow back up with him.  Patient surgery has been canceled and a follow-up visit has been scheduled.  I called the patient's pharmacy and canceled her medications given that surgery was canceled.   Electronically signed by: Estefana FORBES Peck, PA-C 12/09/2023 3:23 PM

## 2023-12-11 ENCOUNTER — Encounter: Admitting: Physician Assistant

## 2023-12-16 ENCOUNTER — Encounter (INDEPENDENT_AMBULATORY_CARE_PROVIDER_SITE_OTHER): Payer: Self-pay | Admitting: Gastroenterology

## 2023-12-16 ENCOUNTER — Encounter: Admitting: Plastic Surgery

## 2023-12-18 ENCOUNTER — Encounter (INDEPENDENT_AMBULATORY_CARE_PROVIDER_SITE_OTHER): Payer: Self-pay | Admitting: Gastroenterology

## 2023-12-18 DIAGNOSIS — H02834 Dermatochalasis of left upper eyelid: Secondary | ICD-10-CM | POA: Diagnosis not present

## 2023-12-18 DIAGNOSIS — H02831 Dermatochalasis of right upper eyelid: Secondary | ICD-10-CM | POA: Diagnosis not present

## 2023-12-18 DIAGNOSIS — H353231 Exudative age-related macular degeneration, bilateral, with active choroidal neovascularization: Secondary | ICD-10-CM | POA: Diagnosis not present

## 2023-12-18 DIAGNOSIS — H35433 Paving stone degeneration of retina, bilateral: Secondary | ICD-10-CM | POA: Diagnosis not present

## 2023-12-18 DIAGNOSIS — H43813 Vitreous degeneration, bilateral: Secondary | ICD-10-CM | POA: Diagnosis not present

## 2023-12-19 ENCOUNTER — Encounter: Payer: Self-pay | Admitting: Plastic Surgery

## 2023-12-19 ENCOUNTER — Ambulatory Visit: Admitting: Plastic Surgery

## 2023-12-19 VITALS — BP 175/105 | HR 75 | Ht 63.0 in | Wt 225.0 lb

## 2023-12-19 DIAGNOSIS — N62 Hypertrophy of breast: Secondary | ICD-10-CM

## 2023-12-19 DIAGNOSIS — M79605 Pain in left leg: Secondary | ICD-10-CM | POA: Diagnosis not present

## 2023-12-19 DIAGNOSIS — I872 Venous insufficiency (chronic) (peripheral): Secondary | ICD-10-CM

## 2023-12-19 DIAGNOSIS — M79604 Pain in right leg: Secondary | ICD-10-CM | POA: Diagnosis not present

## 2023-12-19 DIAGNOSIS — N6481 Ptosis of breast: Secondary | ICD-10-CM

## 2023-12-19 NOTE — Progress Notes (Signed)
 I asked [name] [name] to return today so that we could discuss why I am not proceeding with her surgery.  As she was being evaluated for surgery she was felt to be at a moderate risk for complications from a cardiac standpoint by her primary care provider.  Additionally she was a Caprini of 10 which would require anticoagulation throughout the procedure.  The patient does not have any support at home.  It was my decision that her risk of postoperative complications far outweighed any benefit from the procedure.  I have discussed this with her at length.  She understands and is appreciative that we are not proceeding.  Prior to ending her appointment she pointed out that she has been having increasing difficulty with with pain and erythema in her feet.  Additionally she has an area where there is a small bone on the medial aspect of the great toe which is close to coming through the skin.  She has seen Dr. Harden in the past and is asked if I would refer her again for evaluation possibly for new compression hose.  I will place this consult today.

## 2023-12-23 ENCOUNTER — Other Ambulatory Visit: Payer: Self-pay

## 2023-12-23 NOTE — Patient Instructions (Signed)
 Visit Information  Thank you for taking time to visit with me today. Please don't hesitate to contact me if I can be of assistance to you before our next scheduled appointment.   Your next care management appointment is by telephone on Monday, October 27th at 1:00pm  Please call the care guide team at [phone] if you need to cancel, schedule, or reschedule an appointment.   A reminder to ALL patients/family/friends, please call the [place]  National Suicide Prevention Lifeline: (316)465-0263 or TTY: 7075817137 TTY 205 782 9358) to talk to a trained counselor if you are experiencing a Mental Health or Behavioral Health Crisis or need someone to talk to.  Santana Stamp BSN, CCM [place]  VBCI Population Health RN Care Manager Direct Dial: [phone]  Fax: [phone]

## 2023-12-23 NOTE — Patient Outreach (Signed)
 Complex Care Management   Visit Note  12/23/2023  Name:  [name] [name] MRN: [id number] DOB: 11-09-1940  Situation: Referral received for Complex Care Management related to HTN. I obtained verbal consent from Patient.  Visit completed with [name] [name]  on the phone.  This contact focused on patient's report of left leg redness, warm to touch, pain level of 5/10.  She had an ED visit back on 10/11/23, antibiotics were prescribed, then went to [facility] on 11/12/23 where more antibiotics were prescribed, she completed both courses.   Background:   Past Medical History:  Diagnosis Date   Anemia    Anxiety    Arthritis    Barrett's esophagus    seen on 2016 egd at [facility]   Depression    GERD (gastroesophageal reflux disease)    H/O hiatal hernia    Headache    Hypertension    Macular degeneration    Obesity    Pneumonia    PONV (postoperative nausea and vomiting)    Seasonal allergies    Sepsis (HCC) 10/02/2013    Assessment: Patient Reported Symptoms:  Cognitive Cognitive Status: Alert and oriented to person, place, and time, Normal speech and language skills      Neurological Neurological Review of Symptoms: Not assessed    HEENT HEENT Symptoms Reported: Not assessed      Cardiovascular Cardiovascular Symptoms Reported: Swelling in legs or feet (Reports cellulitis in left leg with redness/warmth, reports pain of 5.  This RNCM conferenced with patient and PCP office for appt, scheduled 12/24/23 at 2pm.)    Respiratory Respiratory Symptoms Reported: No symptoms reported    Endocrine Endocrine Symptoms Reported: Not assessed Is patient diabetic?: No    Gastrointestinal Gastrointestinal Symptoms Reported: Not assessed      Genitourinary Genitourinary Symptoms Reported: Not assessed    Integumentary Integumentary Symptoms Reported: Other Additional Integumentary Details: Reports redness and warmth in left leg, patient states cellulitis has returned. Skin  Self-Management Outcome: 3 (uncertain)  Musculoskeletal Musculoskelatal Symptoms Reviewed: Unsteady gait        Psychosocial Psychosocial Symptoms Reported: Not assessed          12/23/2023    PHQ2-9 Depression Screening   Little interest or pleasure in doing things    Feeling down, depressed, or hopeless    PHQ-2 - Total Score    Trouble falling or staying asleep, or sleeping too much    Feeling tired or having little energy    Poor appetite or overeating     Feeling bad about yourself - or that you are a failure or have let yourself or your family down    Trouble concentrating on things, such as reading the newspaper or watching television    Moving or speaking so slowly that other people could have noticed.  Or the opposite - being so fidgety or restless that you have been moving around a lot more than usual    Thoughts that you would be better off dead, or hurting yourself in some way    PHQ2-9 Total Score    If you checked off any problems, how difficult have these problems made it for you to do your work, take care of things at home, or get along with other people    Depression Interventions/Treatment      Vitals:   12/23/23 1319  BP: (!) 140/72  Pulse: 71    Medications Reviewed Today   Medications were not reviewed in this encounter  Recommendation:   Acute PCP follow-up Conference call made with patient to set up acute appt - 12/24/23 at 2pm. . Patient will elevate left leg, clean with soap and water , apply moisturizer to keep skin supple.    Follow Up Plan:   Telephone follow-up in 1 week  Santana Stamp BSN, CCM [place]  [facility] Population Health RN Care Manager Direct Dial: [phone]  Fax: [phone]

## 2023-12-24 DIAGNOSIS — F339 Major depressive disorder, recurrent, unspecified: Secondary | ICD-10-CM | POA: Diagnosis not present

## 2023-12-24 DIAGNOSIS — M159 Polyosteoarthritis, unspecified: Secondary | ICD-10-CM | POA: Diagnosis not present

## 2023-12-24 DIAGNOSIS — I10 Essential (primary) hypertension: Secondary | ICD-10-CM | POA: Diagnosis not present

## 2023-12-24 DIAGNOSIS — R2681 Unsteadiness on feet: Secondary | ICD-10-CM | POA: Diagnosis not present

## 2023-12-24 DIAGNOSIS — Z6841 Body Mass Index (BMI) 40.0 and over, adult: Secondary | ICD-10-CM | POA: Diagnosis not present

## 2023-12-24 DIAGNOSIS — Z23 Encounter for immunization: Secondary | ICD-10-CM | POA: Diagnosis not present

## 2023-12-24 DIAGNOSIS — I872 Venous insufficiency (chronic) (peripheral): Secondary | ICD-10-CM | POA: Diagnosis not present

## 2023-12-30 ENCOUNTER — Other Ambulatory Visit: Payer: Self-pay

## 2023-12-30 ENCOUNTER — Encounter: Admitting: Physician Assistant

## 2023-12-30 NOTE — Patient Outreach (Signed)
 Complex Care Management   Visit Note  12/30/2023  Name:  [name] [name] MRN: [id number] DOB: 19-Apr-1940  Situation: Referral received for Complex Care Management related to HTN I obtained verbal consent from Patient.  Visit completed with [name] [name]  on the phone.  Sometimes her BP is high at MD visits.  She uses a wrist cuff to monitor, has a BP monitor with cuff that fits around upper arm but she doesn't know how to use it, declined trying to use it today on our phone call.   Background:   Past Medical History:  Diagnosis Date   Anemia    Anxiety    Arthritis    Barrett's esophagus    seen on 2016 egd at [facility]   Depression    GERD (gastroesophageal reflux disease)    H/O hiatal hernia    Headache    Hypertension    Macular degeneration    Obesity    Pneumonia    PONV (postoperative nausea and vomiting)    Seasonal allergies    Sepsis (HCC) 10/02/2013    Assessment: Patient Reported Symptoms:  Cognitive Cognitive Status: Alert and oriented to person, place, and time, Insightful and able to interpret abstract concepts, Normal speech and language skills, No symptoms reported      Neurological Neurological Review of Symptoms: Dizziness, Headaches (Occasional dizzines and headaches, patient believes these symptoms were caused by when a door flew back and hit her in the head some time ago.  States, I told my doctor but they couldn't find anything wrong.  discussed red flag symptoms.)    HEENT HEENT Symptoms Reported: Not assessed      Cardiovascular Cardiovascular Symptoms Reported: Swelling in legs or feet (Swelling in legs at baseline.  She saw PCP 12/24/23 to assess, doctor said it was NOT cellulits.)    Respiratory Respiratory Symptoms Reported: No symptoms reported    Endocrine Endocrine Symptoms Reported: Not assessed Is patient diabetic?: No    Gastrointestinal Gastrointestinal Symptoms Reported: Not assessed      Genitourinary Genitourinary Symptoms  Reported: Not assessed    Integumentary Additional Integumentary Details: Reports discoloration in left leg, saw PCP 12/24/23, negative for cellulitis.  Per patient, doctor said the color was from vein insufficiencies.    Musculoskeletal Musculoskelatal Symptoms Reviewed: Unsteady gait        Psychosocial Psychosocial Symptoms Reported: Not assessed          12/30/2023    PHQ2-9 Depression Screening   Little interest or pleasure in doing things    Feeling down, depressed, or hopeless    PHQ-2 - Total Score    Trouble falling or staying asleep, or sleeping too much    Feeling tired or having little energy    Poor appetite or overeating     Feeling bad about yourself - or that you are a failure or have let yourself or your family down    Trouble concentrating on things, such as reading the newspaper or watching television    Moving or speaking so slowly that other people could have noticed.  Or the opposite - being so fidgety or restless that you have been moving around a lot more than usual    Thoughts that you would be better off dead, or hurting yourself in some way    PHQ2-9 Total Score    If you checked off any problems, how difficult have these problems made it for you to do your work, take care of things at home,  or get along with other people    Depression Interventions/Treatment      There were no vitals filed for this visit.  MEDICATIONS: Patient states she is taking meds as prescribed, no problems/concerns with refills.    Recommendation:   Discussed upcoming appts:  Specialty provider follow-up Gastro 01/01/24; Dr. Palma 01/06/24 Educated patient to continue taking BP at home, to try and use the BP monitor that has cuff that fits around her upper arm.    Follow Up Plan:   Telephone follow-up 01/13/24  Santana Stamp BSN, CCM [place]  [facility] Health RN Care Manager Direct Dial: [phone]  Fax: [phone]

## 2023-12-30 NOTE — Patient Instructions (Signed)
 Visit Information  Thank you for taking time to visit with me today. Please don't hesitate to contact me if I can be of assistance to you before our next scheduled appointment.  Your next care management appointment is by telephone on Monday, November 10th at 12:30pm  Please call the care guide team at [phone] if you need to cancel, schedule, or reschedule an appointment.   A reminder to ALL patients/family/friends, please call the [place]  National Suicide Prevention Lifeline: 857-524-5198 or TTY: (303)259-6404 TTY 214-439-1496) to talk to a trained counselor if you are experiencing a Mental Health or Behavioral Health Crisis or need someone to talk to.  Santana Stamp BSN, CCM [place]  [facility] Population Health RN Care Manager Direct Dial: [phone]  Fax: [phone]

## 2023-12-31 ENCOUNTER — Encounter (HOSPITAL_BASED_OUTPATIENT_CLINIC_OR_DEPARTMENT_OTHER): Payer: Self-pay

## 2023-12-31 ENCOUNTER — Ambulatory Visit (HOSPITAL_BASED_OUTPATIENT_CLINIC_OR_DEPARTMENT_OTHER): Admit: 2023-12-31 | Admitting: Plastic Surgery

## 2023-12-31 SURGERY — MAMMOPLASTY, REDUCTION
Anesthesia: Choice | Laterality: Bilateral

## 2024-01-01 ENCOUNTER — Encounter (INDEPENDENT_AMBULATORY_CARE_PROVIDER_SITE_OTHER): Payer: Self-pay | Admitting: Gastroenterology

## 2024-01-01 ENCOUNTER — Encounter (INDEPENDENT_AMBULATORY_CARE_PROVIDER_SITE_OTHER): Payer: Self-pay

## 2024-01-01 ENCOUNTER — Encounter: Admitting: Physician Assistant

## 2024-01-01 ENCOUNTER — Ambulatory Visit (INDEPENDENT_AMBULATORY_CARE_PROVIDER_SITE_OTHER): Admitting: Gastroenterology

## 2024-01-01 VITALS — BP 163/75 | HR 64 | Temp 98.0°F | Ht 63.0 in | Wt 225.0 lb

## 2024-01-01 DIAGNOSIS — R1114 Bilious vomiting: Secondary | ICD-10-CM | POA: Diagnosis not present

## 2024-01-01 DIAGNOSIS — K582 Mixed irritable bowel syndrome: Secondary | ICD-10-CM

## 2024-01-01 DIAGNOSIS — K649 Unspecified hemorrhoids: Secondary | ICD-10-CM | POA: Diagnosis not present

## 2024-01-01 MED ORDER — ONDANSETRON 4 MG PO TBDP: 4.0000 mg | ORAL_TABLET | Freq: Three times a day (TID) | ORAL | 1 refills | Status: AC | PRN

## 2024-01-01 MED ORDER — DICYCLOMINE HCL 10 MG PO CAPS: 10.0000 mg | ORAL_CAPSULE | Freq: Two times a day (BID) | ORAL | 1 refills | Status: AC | PRN

## 2024-01-01 NOTE — Patient Instructions (Addendum)
-  Continue omeprazole  20mg , take this twice daily consistently  -Continue miralax , try taking 1-2 capfuls daily to every other day to keep bowels moving  -I will call in [facility] hemorrhoid cream for you to use  -Increase water  intake, aim for atleast 64 oz per day -Increase fruits, veggies and whole grains, kiwi and prunes are especially good for constipation -Can continue with zofran  4mg  as needed for nausea.  Take it sublingually every time you feel nauseous with -Start Bentyl  1 tablet q12h as needed for abdominal pain

## 2024-01-01 NOTE — Progress Notes (Signed)
 Toribio Fortune, M.D. Gastroenterology & Hepatology [facility] [address] [place], [place] [address]  Primary Care Physician: Lari Elspeth BRAVO, MD [address] [place] [address]  I will communicate my assessment and recommendations to the referring MD via EMR.  Problems: Intermittent nausea and vomiting GERD complicated by Barrett's esophagus IBS-C Hemorrhoids  History of Present Illness: [name] [name] is a 83 y.o. female with past medical history of IBS-C, IDA, Gerd, Barrett's esophagus, anxiety, depression, HTN, macular degeneration  who presents for follow up of chronic abdominal pain, hemorrhoids, nausea and vomiting.  The patient was last seen on 06/11/2023. At that time, the patient was advised to continue omeprazole  20 mg twice daily and to continue MiraLAX  for constipation.  Also advised to continue Zofran  as needed.  Advised to take hemorrhoidal cream as needed for hemorrhoidal symptoms.  Patient reports that 2 days ago she woke up with nausea and diaphoresis. She vomited bilious contents. States that she had the flu shot that day. She took one Zofran  the day before and took an Imodium.   Patient reports that she has presented issues with abdominal pain in the upper abdomen. States this has been hurting for the last couple of days. Has taken Gas-X which helps slightly but not significantly.  The patient denies having any fever, chills, hematochezia, melena, hematemesis, abdominal distention, diarrhea, jaundice, pruritus or weight loss. She has felt too weak recently.  Would need refill of hemorrhoidal cream, as it provides improvement of symptoms in her anal.  Patient reports having other active medical issues with cellulitis, for which she has been on a course of doxycycline .  Gastric emptying study done November 16, 2022 was normal   Last Colonoscopy:2/2022Two 3 to 5 mm polyps in the cecum,  - Three 3 to 6 mm polyps in the  ascending colon,  - Four 4 to 8 mm polyps in the transverse colon,  - Seven 3 to 9 mm polyps in the descending colon,  - Diverticulosis in the sigmoid colon and in the descending colon. - The distal rectum and anal verge are normal on retroflexion view. (Tubular adenomas, sessile serrated polyps, hyperplastic polyps) no repeat recommended  Last Endoscopy: 08/2022 - Diverticulum in the lower third of the esophagus.                           - Barrett's esophagus. Biopsied BE without dysplasia                            - Gastric bypass with a pouch 4 cm in length.                            Gastrojejunal anastomosis characterized by healthy                            appearing mucosa.                           - Normal examined jejunum (afferent and efferent                            limbs).   Recommendations:  Repeat EGD in 4 years if medically fit   Past Medical History:  Past Medical History:  Diagnosis Date   Anemia    Anxiety    Arthritis    Barrett's esophagus    seen on [facility]d [facility]   Depression    GERD (gastroesophageal reflux disease)    H/O hiatal hernia    Headache    Hypertension    Macular degeneration    Obesity    Pneumonia    PONV (postoperative nausea and vomiting)    Seasonal allergies    Sepsis (HCC) 10/02/2013    Past Surgical History: Past Surgical History:  Procedure Laterality Date   ABDOMINAL HYSTERECTOMY     ABDOMINAL SURGERY     APPENDECTOMY     BACK SURGERY     lumbar x2 by Dr Mavis   BIOPSY  09/03/2019   Procedure: BIOPSY;  Surgeon: Golda Claudis PENNER, MD;  [facility]: [facility];  Service: Endoscopy;;  esophagus   BIOPSY  08/21/2022   Procedure: BIOPSY;  Surgeon: Eartha Angelia Sieving, MD;  [facility]: [facility];  Service: Gastroenterology;;   DORY LIFT Bilateral 08/07/2023   Procedure: BLEPHAROPLASTY;  Surgeon: Luciano Standing, MD;  Location: [facility];  Service: ENT;  Laterality: Bilateral;  BILATERAL UPPER  EYELID BLEPHAROPLASTY   CARPAL TUNNEL RELEASE Right 03/2014   Dr. Barbarann   CATARACT EXTRACTION W/PHACO Left 08/06/2013   Procedure: CATARACT EXTRACTION PHACO AND INTRAOCULAR LENS PLACEMENT LEFT EYE;  Surgeon: Cherene Mania, MD;  Location: [facility];  Service: Ophthalmology;  Laterality: Left;  CDE: 9.55   CATARACT EXTRACTION W/PHACO Right 08/24/2013   Procedure: CATARACT EXTRACTION PHACO AND INTRAOCULAR LENS PLACEMENT (IOC);  Surgeon: Cherene Mania, MD;  Location: [facility];  Service: Ophthalmology;  Laterality: Right;  CDE:8.30   CERVICAL FUSION     CHOLECYSTECTOMY     COLONOSCOPY N/A 11/03/2013   SLF: 1. Normal mucosa in the terminal ileum. 2. 13 colon polyps removed. 3. Moderate diverticulosis in the descending colon and sigmoid colon 4. the left colon is redundant 5. Small internal  hemorrhoids.   COLONOSCOPY N/A 09/25/2017   Procedure: COLONOSCOPY;  Surgeon: Golda Claudis PENNER, MD;  [facility];  Service: Endoscopy;  Laterality: N/A;  1:55   COLONOSCOPY WITH PROPOFOL  N/A 04/29/2020   Castaneda: multiple polyps (tubular adenoma, sessile serrated, hyperplastic) ascending colon, transverse colon, descending colon, diverticulsos in sigmoid and descending colon, rectum/anal vergse normal   ESOPHAGOGASTRODUODENOSCOPY N/A 11/03/2013   SLF: 1. 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 Mild Non-erosive gastritis   ESOPHAGOGASTRODUODENOSCOPY  10/2014   [facility]: IMPRESSIONS: - likely small distal esophageal diverticulum without evidence of fistula, +barrett's esophagus without dysplasia. s/p gastric bypass   ESOPHAGOGASTRODUODENOSCOPY (EGD) WITH PROPOFOL  N/A 09/03/2019   rehman: normal hypopharynx, esophagus, mid esophagus, diverticulum in distal esophagus, short segment barretts esophagus, gastric bypass with small sized pouch, gastrojejunal anastomosis with healthy appearing mucosa, normal gastric body and jejunum   ESOPHAGOGASTRODUODENOSCOPY (EGD)  WITH PROPOFOL  N/A 08/21/2022   Procedure: ESOPHAGOGASTRODUODENOSCOPY (EGD) WITH PROPOFOL ;  Surgeon: Eartha Angelia Sieving, MD;  [facility];  Service: Gastroenterology;  Laterality: N/A;  2:30am;asa 3   EYE SURGERY     GASTRIC BYPASS  1979   revision in 1980    HIATAL HERNIA REPAIR     [facility] in 2011   JOINT REPLACEMENT Right 1995   hip   JOINT REPLACEMENT Left 2008   hip   MEDIAL PARTIAL KNEE REPLACEMENT Left    Dr Rubie   PARAESOPHAGEAL  HERNIA REPAIR  SEP 2010 DR. MCNATT   POLYPECTOMY  09/25/2017   Procedure: POLYPECTOMY;  Surgeon: Golda Claudis PENNER, MD;  [facility];  Service: Endoscopy;;  colon   POLYPECTOMY  04/29/2020   Procedure: POLYPECTOMY;  Surgeon: Eartha Angelia Sieving, MD;  [facility];  Service: Gastroenterology;;   SHOULDER ARTHROSCOPY Right    SHOULDER ARTHROSCOPY Right    x2   SKIN BIOPSY     TONSILLECTOMY     TOTAL KNEE ARTHROPLASTY Right 09/08/2018   Procedure: RIGHT TOTAL KNEE ARTHROPLASTY;  Surgeon: Barbarann Oneil BROCKS, MD;  Location: [facility];  Service: Orthopedics;  Laterality: Right;   TOTAL SHOULDER ARTHROPLASTY Right 02/16/2013   Procedure: TOTAL SHOULDER ARTHROPLASTY;  Surgeon: Oneil BROCKS Barbarann, MD;  Location: [facility];  Service: Orthopedics;  Laterality: Right;  Right Total Shoulder Arthroplasty, Cemented   WEIL OSTEOTOMY Right 07/18/2021   Procedure: RIGHT FOOT REVISION WEIL OSTEOTOMY 2ND AND 3RD METATARSAL, GASTROCNEMIUS RECESSION, PROXIMAL INTERPHALANGEAL RESECTION 2ND AND 3RD TOES;  Surgeon: Harden Jerona GAILS, MD;  Location: [facility];  Service: Orthopedics;  Laterality: Right;  regional with monitored anesthesia care    Family History: Family History  Problem Relation Age of Onset   Breast cancer Mother    Hypertension Father    Heart disease Father    Hypertension Sister    Colon cancer Brother        IN HIS 60s-METASTATIC   Hypertension Brother    Heart disease Brother    Colon polyps Paternal  Grandfather     Social History: Social History   Tobacco Use  Smoking Status Never   Passive exposure: Past  Smokeless Tobacco Never   Social History   Substance and Sexual Activity  Alcohol  Use No   Social History   Substance and Sexual Activity  Drug Use No    Allergies: Allergies  Allergen Reactions   Gadavist  [Gadobutrol ] Itching and Swelling    Per patient    Novocain [Procaine Hcl] Other (See Comments)    Unknown-passed out with novocaine injected for dental surgery.   Other Hives and Other (See Comments)    EKG pads - need to use pediatric pads   Latex Rash   Penicillins Rash and Other (See Comments)    Has patient had a PCN reaction causing immediate rash, facial/tongue/throat swelling, SOB or lightheadedness with hypotension: Yes Has patient had a PCN reaction causing severe rash involving mucus membranes or skin necrosis: No Has patient had a PCN reaction that required hospitalization No Has patient had a PCN reaction occurring within the last 10 years: No  If all of the above answers are NO, then may proceed with Cephalosporin use.    Sulfa Antibiotics Hives and Rash    Medications: Current Outpatient Medications  Medication Sig Dispense Refill   acetaminophen  (TYLENOL ) 500 MG tablet Take 500-1,000 mg by mouth every 6 (six) hours as needed for moderate pain or headache.     albuterol  (PROVENTIL ) (2.5 MG/3ML) 0.083% nebulizer solution Take 3 mLs (2.5 mg total) by nebulization every 4 (four) hours as needed for wheezing. 75 mL 12   alendronate (FOSAMAX) 70 MG tablet Take 70 mg by mouth once a week.     aspirin  EC 81 MG tablet Take 81 mg by mouth daily. Swallow whole.     AZO-CRANBERRY PO Take 2 tablets by mouth daily as needed (kidney infections).      Calcium -Phosphorus-Vitamin D  (CITRACAL +D3 PO) Take 1 tablet by mouth daily.  Cholecalciferol  (VITAMIN D3) 50 MCG (2000 UT) capsule Take 2,000 Units by mouth daily.      clobetasol (TEMOVATE) 0.05 %  external solution Apply 1 Application topically 2 (two) times daily.     conjugated estrogens  (PREMARIN ) vaginal cream Place 1 Applicatorful vaginally at bedtime. Use as directed at bedtime 30 g 12   docusate sodium  (COLACE) 100 MG capsule Take 100 mg by mouth at bedtime as needed for mild constipation.      Ferrous Sulfate  (SLOW FE PO) Take 1 tablet by mouth every 7 (seven) days.     fluconazole  (DIFLUCAN ) 200 MG tablet Take 200 mg by mouth once a week.     ketoconazole (NIZORAL) 2 % cream Apply 1 Application topically 2 (two) times daily as needed for irritation.     ketoconazole (NIZORAL) 2 % shampoo Apply 1 Application topically every 7 (seven) days.     Lactobacillus (FLORAJEN ACIDOPHILUS) CAPS Take 1 capsule by mouth daily before breakfast.     lisinopril  (ZESTRIL ) 10 MG tablet Take 10 mg by mouth every morning.     loratadine (CLARITIN) 10 MG tablet Take 10 mg by mouth daily.     magnesium  gluconate (MAGONATE) 500 (27 Mg) MG TABS tablet Take 500 mg by mouth daily.     Multiple Vitamins-Minerals (HAIR SKIN AND NAILS FORMULA PO) Take by mouth.     NONFORMULARY OR COMPOUNDED ITEM [facility] hemorrhoid cream. Called in refills 06/11/2023 p ov with Chelsea Carlan, Np.     Nystatin  (GERHARDT'S BUTT CREAM) CREA Apply 1 application topically 3 (three) times daily as needed for irritation. 1 each 11   nystatin  ointment (MYCOSTATIN ) Apply 1 application topically 2 (two) times daily as needed (for irritation).     omeprazole  (PRILOSEC) 20 MG capsule Take 1 capsule (20 mg total) by mouth daily. 90 capsule 3   ondansetron  (ZOFRAN ) 4 MG tablet Take 4 mg by mouth every 8 (eight) hours as needed for nausea or vomiting.     sertraline  (ZOLOFT ) 100 MG tablet Take 100 mg by mouth daily.     simethicone  (MYLICON) 125 MG chewable tablet Chew 125 mg by mouth every 6 (six) hours as needed for flatulence.     tobramycin -dexamethasone  (TOBRADEX ) ophthalmic solution every 4 (four) hours while awake.      traZODone  (DESYREL ) 50 MG tablet Take 50 mg by mouth at bedtime.     No current facility-administered medications for this visit.    Review of Systems: GENERAL: negative for malaise, night sweats HEENT: No changes in hearing or vision, no nose bleeds or other nasal problems. NECK: Negative for lumps, goiter, pain and significant neck swelling RESPIRATORY: Negative for cough, wheezing CARDIOVASCULAR: Negative for chest pain, leg swelling, palpitations, orthopnea GI: SEE HPI MUSCULOSKELETAL: Negative for joint pain or swelling, back pain, and muscle pain. SKIN: Negative for lesions, rash PSYCH: Negative for sleep disturbance, mood disorder and recent psychosocial stressors. HEMATOLOGY Negative for prolonged bleeding, bruising easily, and swollen nodes. ENDOCRINE: Negative for cold or heat intolerance, polyuria, polydipsia and goiter. NEURO: negative for tremor, gait imbalance, syncope and seizures. The remainder of the review of systems is noncontributory.   Physical Exam: BP (!) 163/75   Pulse 64   Temp 98 F (36.7 C)   Ht 5' 3 (1.6 m)   Wt 225 lb (102.1 kg)   BMI 39.86 kg/m  GENERAL: The patient is AO x3, in no acute distress.  Sitting wheelchair. HEENT: Head is normocephalic and atraumatic. EOMI are intact. Mouth is  well hydrated and without lesions. NECK: Supple. No masses LUNGS: Clear to auscultation. No presence of rhonchi/wheezing/rales. Adequate chest expansion HEART: RRR, normal s1 and s2. ABDOMEN: Mildly tender to palpation diffusely, no guarding, no peritoneal signs, and nondistended. BS +. No masses. EXTREMITIES: Without any cyanosis, clubbing, rash, lesions or edema. NEUROLOGIC: AOx3, no focal motor deficit. SKIN: no jaundice, no rashes  Imaging/Labs: as above  I personally reviewed and interpreted the available labs, imaging and endoscopic files.  Impression and Plan: [name] [name] is a 83 y.o. female with past medical history of IBS-C, IDA, Gerd, Barrett's  esophagus, anxiety, depression, HTN, macular degeneration  who presents for follow up of chronic abdominal pain, hemorrhoids, nausea and vomiting.  Patient has presented intermittent episodes of abdominal pain.  Has had previous cross-sectional abdominal imaging and relatively recent endoscopic evaluations that have been unremarkable.  Has had similar complaints for years although this has worsened recently.  I consider most of her symptoms are related to a bowel hypersensitivity etiology.  She will be started on Bentyl  as needed to relieve her symptoms.  Can also continue pending dietary modifications for now and continuing omeprazole  20 mg twice a day.  Patient reports a new episode of nausea with vomiting that was severe.  However, she has been symptom-free for quite some time.  Will provide symptom relief for now with Zofran  as needed, which will be refilled today.  Finally, she needs refills of hemorrhoid compound cream which has provided relief to her anorectal symptoms.  I will refill this medication today.  -Continue omeprazole  20mg , take this twice daily consistently  -Continue miralax , try taking 1-2 capfuls daily to every other day to keep bowels moving  -I will call in [facility] hemorrhoid cream for you to use  -Increase water  intake, aim for atleast 64 oz per day -Increase fruits, veggies and whole grains, kiwi and prunes are especially good for constipation -Can continue with zofran  4mg  as needed for nausea.  Take it sublingually every time you feel nauseous with -Start Bentyl  1 tablet q12h as needed for abdominal pain  All questions were answered.      Toribio Fortune, MD Gastroenterology and Hepatology [facility]

## 2024-01-02 ENCOUNTER — Ambulatory Visit: Admitting: Orthopedic Surgery

## 2024-01-06 ENCOUNTER — Encounter: Admitting: Plastic Surgery

## 2024-01-06 ENCOUNTER — Encounter: Payer: Self-pay | Admitting: Radiology

## 2024-01-07 ENCOUNTER — Ambulatory Visit: Admitting: Orthopedic Surgery

## 2024-01-07 DIAGNOSIS — B351 Tinea unguium: Secondary | ICD-10-CM | POA: Diagnosis not present

## 2024-01-07 MED ORDER — HYDROCODONE-ACETAMINOPHEN 5-325 MG PO TABS: 1.0000 | ORAL_TABLET | Freq: Two times a day (BID) | ORAL | 0 refills | Status: AC | PRN

## 2024-01-08 ENCOUNTER — Encounter: Payer: Self-pay | Admitting: Orthopedic Surgery

## 2024-01-08 NOTE — Progress Notes (Signed)
 Office Visit Note   Patient: [name] [name]           Date of Birth: [birth year]           MRN: [id number] Visit Date: 01/07/2024              Requested by: Waddell Leonce NOVAK, MD [address] [place],  [place] [address] PCP: Lari Elspeth FORBES, MD  Chief Complaint  Patient presents with   Left Leg - Wound Check   Right Leg - Wound Check      HPI: Discussed the use of AI scribe software for clinical note transcription with the patient, who gave verbal consent to proceed.  History of Present Illness [name] [name] is an 83 year old female who presents with foot pain and difficulty managing toenail care.  She has ongoing foot pain and difficulty managing her toenails. She attempted to cut her toenails herself, which she believes led to an infection. She visited the emergency room and was prescribed cyclamen. She is frustrated with the pain and the lack of effective pain relief options.  She has a history of varicose veins and venous swelling in both lower extremities, causing significant discomfort. She has been trying to manage the swelling by wearing compression stockings and elevating her legs, but finds it difficult due to the swelling. She purchased a bed that elevates her legs, but still struggles to find comfort and sleep due to the pain.  She has been prescribed hydrocodone  in the past for pain management, particularly after a planned breast reduction surgery was canceled due to concerns about her heart. She lives alone and finds daily activities challenging due to her condition. She uses a walker for mobility within her home.  She wants to have her toenails professionally trimmed regularly, as she finds it difficult to do so herself without causing injury.     Assessment & Plan: Visit Diagnoses: No diagnosis found.  Plan: Assessment and Plan Assessment & Plan Onychomycosis with difficulty self-trimming nails Chronic onychomycosis with thickened, discolored  nails causing difficulty in self-trimming. Nails were trimmed without complications. - Trimmed nails as needed. - Scheduled follow-up in three months for nail care.  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 No complications noted during examination. - Trimmed hypertrophic corn.  Varicose veins with chronic venous insufficiency and pitting edema Chronic venous insufficiency with varicose veins and pitting edema in both lower extremities. Swelling and pain present. Difficulty wearing compression stockings due to swelling. Elevation of legs and use of compression stockings recommended to reduce swelling. - Encouraged use of zippered compression stockings. - Advised leg elevation to reduce swelling.  Difficulty with ambulation and transfers Difficulty transitioning from sitting to standing position. Uses a rolling walker for ambulation. Hydrocodone  prescribed for pain management, with caution advised due to drowsiness and risk of falls. - Prescribed hydrocodone  for pain management. - Advised caution with ambulation due to drowsiness from medication.      Follow-Up Instructions: No follow-ups on file.   Ortho Exam  Patient is alert, oriented, no adenopathy, well-dressed, normal affect, normal respiratory effort. Physical Exam EXTREMITIES: Varicose veins and venous swelling with pitting edema in both lower extremities. MUSCULOSKELETAL: Hypertrophic corn at MTP joint medially of right great toe. Thickened discolored onychomycotic nails, trimmed without complication.      Imaging: No results found. No images are attached to the encounter.  Labs: Lab Results  Component Value Date   ESRSEDRATE 6 09/08/2020   ESRSEDRATE 3 10/11/2016   LABURIC 5.7 09/08/2020   REPTSTATUS 01/03/2022 FINAL 12/29/2021   CULT  12/29/2021    NO GROWTH 5 DAYS Performed at [facility], [address] [place], [place] [address]     [facility] ACINETOBACTER LWOFFII 12/28/2013     Lab Results  Component Value Date   ALBUMIN 4.3 08/15/2022   ALBUMIN 3.5 12/29/2021   ALBUMIN 4.0 09/02/2018    Lab Results  Component Value Date   MG 1.6 (L) 02/05/2017   MG 2.4 12/29/2013   MG 1.4 (L) 12/28/2013   No results found for: VD25OH  No results found for: PREALBUMIN    Latest Ref Rng & Units 08/15/2022    6:00 PM 12/30/2021    4:29 AM 12/29/2021   12:36 PM  CBC EXTENDED  WBC 4.0 - 10.5 K/uL 7.0  9.2  11.5   RBC 3.87 - 5.11 MIL/uL 4.54  3.75  4.25   Hemoglobin 12.0 - 15.0 g/dL 86.4  88.7  87.2   HCT 36.0 - 46.0 % 42.8  34.2  38.6   Platelets 150 - 400 K/uL 149  84  92   NEUT# 1.7 - 7.7 K/uL   9.3   Lymph# 0.7 - 4.0 K/uL   0.3      There is no height or weight on file to calculate BMI.  Orders:  No orders of the defined types were placed in this encounter.  Meds ordered this encounter  Medications   HYDROcodone -acetaminophen  (NORCO/VICODIN) 5-325 MG tablet    Sig: Take 1 tablet by mouth every 12 (twelve) hours as needed.    Dispense:  15 tablet    Refill:  0     Procedures: No procedures performed  Clinical Data: No additional findings.  ROS:  All other systems negative, except as noted in the HPI. Review of Systems  Objective: Vital Signs: There were no vitals taken for this visit.  Specialty Comments:  No specialty comments available.  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   Irritable bowel syndrome with both constipation and diarrhea 04/14/2020   Iron  deficiency anemia 04/14/2020   Impingement syndrome of left shoulder 02/06/2019   S/P total knee arthroplasty, 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   Primary osteoarthritis, 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   History of arthroplasty of right shoulder 08/14/2018   History of colonic polyps 08/14/2017   Choledocholithiasis    Cholestasis (HCC) 02/05/2017   Depression 02/05/2017   Hypomagnesemia 02/05/2017   Bradycardia 02/05/2017   Transaminitis 07/08/2016   Constipation 07/08/2016   GIB (gastrointestinal bleeding) 07/08/2016   HCAP (healthcare-associated pneumonia) 09/23/2015   AKI (acute kidney injury) 07/21/2015   Hypokalemia 07/21/2015   CAP (community acquired pneumonia) 07/14/2015   Upper abdominal pain 06/10/2014   Folliculitis of perineum 02/24/2014   Giant comedone 12/21/2013   Sebaceous gland hyperplasia of vulva 11/25/2013   Acrochordon 11/25/2013   Dysphagia  10/29/2013   Class 2 severe obesity due to excess calories with serious comorbidity and body mass index (BMI) of 38.0 to 38.9 in adult 10/02/2013   Primary hypertension    GERD (gastroesophageal reflux disease)    Anxiety    Anemia of chronic disease    Past Medical History:  Diagnosis Date   Anemia    Anxiety    Arthritis    Barrett's esophagus    seen on 2016 egd at [facility]   Depression    GERD (gastroesophageal reflux disease)    H/O hiatal hernia    Headache    Hypertension    Macular degeneration    Obesity    Pneumonia    PONV (postoperative nausea and vomiting)    Seasonal allergies    Sepsis (HCC) 10/02/2013    Family History  Problem Relation Age of Onset   Breast cancer Mother    Hypertension Father    Heart disease Father    Hypertension Sister    Colon cancer Brother        IN HIS 60s-METASTATIC   Hypertension Brother    Heart disease Brother    Colon polyps Paternal Grandfather     Past Surgical  History:  Procedure Laterality Date   ABDOMINAL HYSTERECTOMY     ABDOMINAL SURGERY     APPENDECTOMY     BACK SURGERY     lumbar x2 by Dr Mavis   BIOPSY  09/03/2019   Procedure: BIOPSY;  Surgeon: Golda Claudis PENNER, MD;  [facility];  Service: Endoscopy;;  esophagus   BIOPSY  08/21/2022   Procedure: BIOPSY;  Surgeon: Eartha Angelia Sieving, MD;  [facility];  Service: Gastroenterology;;   DORY LIFT Bilateral 08/07/2023   Procedure: BLEPHAROPLASTY;  Surgeon: Luciano Standing, MD;  Location: [facility];  Service: ENT;  Laterality: Bilateral;  BILATERAL UPPER EYELID BLEPHAROPLASTY   CARPAL TUNNEL RELEASE Right 03/2014   Dr. Barbarann   CATARACT EXTRACTION W/PHACO Left 08/06/2013   Procedure: CATARACT EXTRACTION PHACO AND INTRAOCULAR LENS PLACEMENT LEFT EYE;  Surgeon: Cherene Mania, MD;  Location: [facility];  Service: Ophthalmology;  Laterality: Left;  CDE: 9.55   CATARACT EXTRACTION W/PHACO Right 08/24/2013   Procedure: CATARACT EXTRACTION PHACO AND INTRAOCULAR LENS PLACEMENT (IOC);  Surgeon: Cherene Mania, MD;  Location: [facility];  Service: Ophthalmology;  Laterality: Right;  CDE:8.30   CERVICAL FUSION     CHOLECYSTECTOMY     COLONOSCOPY N/A 11/03/2013   SLF: 1. Normal mucosa in the terminal ileum. 2. 13 colon polyps removed. 3. Moderate diverticulosis in the descending colon and sigmoid colon 4. the left colon is redundant 5. Small internal  hemorrhoids.   COLONOSCOPY N/A 09/25/2017   Procedure: COLONOSCOPY;  Surgeon: Golda Claudis PENNER, MD;  [facility];  Service: Endoscopy;  Laterality: N/A;  1:55   COLONOSCOPY WITH PROPOFOL  N/A 04/29/2020   Castaneda: multiple polyps (tubular adenoma, sessile serrated, hyperplastic) ascending colon, transverse colon, descending colon, diverticulsos in sigmoid and descending colon, rectum/anal vergse normal   ESOPHAGOGASTRODUODENOSCOPY N/A 11/03/2013   SLF: 1. Dysphagia most likely due to sliding gastic pouch and non-  adherence to gastric bypass diet 2. Mild Non-erosive gastritis   ESOPHAGOGASTRODUODENOSCOPY  10/2014   [facility]: IMPRESSIONS: - likely small distal esophageal diverticulum without evidence of fistula, +barrett's esophagus without dysplasia. s/p gastric bypass   ESOPHAGOGASTRODUODENOSCOPY (EGD) WITH PROPOFOL  N/A 09/03/2019   rehman: normal hypopharynx, esophagus, mid esophagus, diverticulum in distal esophagus, short segment barretts esophagus, gastric  bypass with small sized pouch, gastrojejunal anastomosis with healthy appearing mucosa, normal gastric body and jejunum   ESOPHAGOGASTRODUODENOSCOPY (EGD) WITH PROPOFOL  N/A 08/21/2022   Procedure: ESOPHAGOGASTRODUODENOSCOPY (EGD) WITH PROPOFOL ;  Surgeon: Eartha Angelia Sieving, MD;  [facility];  Service: Gastroenterology;  Laterality: N/A;  2:30am;asa 3   EYE SURGERY     GASTRIC BYPASS  1979   revision in 1980    HIATAL HERNIA REPAIR     [facility] in 2011   JOINT REPLACEMENT Right 1995   hip   JOINT REPLACEMENT Left 2008   hip   MEDIAL PARTIAL KNEE REPLACEMENT Left    Dr Rubie   PARAESOPHAGEAL HERNIA REPAIR  SEP 2010 DR. MCNATT   POLYPECTOMY  09/25/2017   Procedure: POLYPECTOMY;  Surgeon: Golda Claudis PENNER, MD;  [facility];  Service: Endoscopy;;  colon   POLYPECTOMY  04/29/2020   Procedure: POLYPECTOMY;  Surgeon: Eartha Angelia Sieving, MD;  [facility];  Service: Gastroenterology;;   SHOULDER ARTHROSCOPY Right    SHOULDER ARTHROSCOPY Right    x2   SKIN BIOPSY     TONSILLECTOMY     TOTAL KNEE ARTHROPLASTY Right 09/08/2018   Procedure: RIGHT TOTAL KNEE ARTHROPLASTY;  Surgeon: Barbarann Oneil BROCKS, MD;  Location: [facility];  Service: Orthopedics;  Laterality: Right;   TOTAL SHOULDER ARTHROPLASTY Right 02/16/2013   Procedure: TOTAL SHOULDER ARTHROPLASTY;  Surgeon: Oneil BROCKS Barbarann, MD;  Location: [facility];  Service: Orthopedics;  Laterality: Right;  Right Total Shoulder Arthroplasty, Cemented   WEIL OSTEOTOMY Right  07/18/2021   Procedure: RIGHT FOOT REVISION WEIL OSTEOTOMY 2ND AND 3RD METATARSAL, GASTROCNEMIUS RECESSION, PROXIMAL INTERPHALANGEAL RESECTION 2ND AND 3RD TOES;  Surgeon: Harden Jerona GAILS, MD;  Location: [facility];  Service: Orthopedics;  Laterality: Right;  regional with monitored anesthesia care   Social History   Occupational History   Not on file  Tobacco Use   Smoking status: Never    Passive exposure: Past   Smokeless tobacco: Never  Vaping Use   Vaping status: Never Used  Substance and Sexual Activity   Alcohol  use: No   Drug use: No   Sexual activity: Yes    Birth control/protection: Surgical

## 2024-01-13 ENCOUNTER — Other Ambulatory Visit: Payer: Self-pay

## 2024-01-13 NOTE — Patient Instructions (Signed)
 Visit Information  Thank you for taking time to visit with me today. Please don't hesitate to contact me if I can be of assistance to you before our next scheduled appointment.  Your next care management appointment is by telephone on Monday, December 8th at 1:30pm.   Please call the care guide team at [phone] if you need to cancel, schedule, or reschedule an appointment.   Please call the [place]  National Suicide Prevention Lifeline: 820-487-6788 or TTY: 985-322-1515 TTY [phone]) to talk to a trained counselor if you are experiencing a Mental Health or Behavioral Health Crisis or need someone to talk to.  Santana Stamp BSN, CCM [place]  VBCI Population Health RN Care Manager Direct Dial: [phone]  Fax: [phone]

## 2024-01-13 NOTE — Patient Outreach (Addendum)
 Complex Care Management   Visit Note  01/13/2024  Name:  [name] [name] MRN: [id number] DOB: 1940-06-10  Situation: Referral received for Complex Care Management related to HTN I obtained verbal consent from Patient.  Visit completed with [name] [name]  on the phone.  Patient is using a wrist cuff to take BP's at home.   Background:   Past Medical History:  Diagnosis Date   Anemia    Anxiety    Arthritis    Barrett's esophagus    seen on 2016 egd at [facility]   Depression    GERD (gastroesophageal reflux disease)    H/O hiatal hernia    Headache    Hypertension    Macular degeneration    Obesity    Pneumonia    PONV (postoperative nausea and vomiting)    Seasonal allergies    Sepsis (HCC) 10/02/2013    Assessment: Patient Reported Symptoms:  Cognitive Cognitive Status: No symptoms reported, Alert and oriented to person, place, and time, Insightful and able to interpret abstract concepts, Normal speech and language skills      Neurological Neurological Review of Symptoms: No symptoms reported Neurological Comment: Occasional dizziness and headaches due to weather changes, denies today.  HEENT HEENT Symptoms Reported: No symptoms reported      Cardiovascular Cardiovascular Symptoms Reported: Swelling in legs or feet Patient's Recent BP reading at home: Swelling at baseline, no worse.  She has purchased zippered compression stockings as recommended by Ortho MD, she hasn't tried as of yet. Cardiovascular Management Strategies: Medication therapy, Medical device  Respiratory Respiratory Symptoms Reported: No symptoms reported    Endocrine Endocrine Symptoms Reported: Not assessed Is patient diabetic?: No    Gastrointestinal Gastrointestinal Symptoms Reported: No symptoms reported      Genitourinary Genitourinary Symptoms Reported: No symptoms reported Additional Genitourinary Details: Continues to use prescribed cream to help with any irritation that  presents. Genitourinary Self-Management Outcome: 4 (good)  Integumentary Integumentary Symptoms Reported: No symptoms reported    Musculoskeletal Musculoskelatal Symptoms Reviewed: Unsteady gait        Psychosocial Psychosocial Symptoms Reported: Not assessed          01/13/2024    PHQ2-9 Depression Screening   Little interest or pleasure in doing things    Feeling down, depressed, or hopeless    PHQ-2 - Total Score    Trouble falling or staying asleep, or sleeping too much    Feeling tired or having little energy    Poor appetite or overeating     Feeling bad about yourself - or that you are a failure or have let yourself or your family down    Trouble concentrating on things, such as reading the newspaper or watching television    Moving or speaking so slowly that other people could have noticed.  Or the opposite - being so fidgety or restless that you have been moving around a lot more than usual    Thoughts that you would be better off dead, or hurting yourself in some way    PHQ2-9 Total Score    If you checked off any problems, how difficult have these problems made it for you to do your work, take care of things at home, or get along with other people    Depression Interventions/Treatment      There were no vitals filed for this visit.  MEDICATIONS: No concerns with meds, no problems obtaining refills.    Recommendation:   Continue Current Plan of Care Patient will  try on zippered compression stocking to control edema in bilateral LE as ordered by Dr. Harden.  She will continue to take BP's at home, will discuss BP log on next contact.   Follow Up Plan:   Telephone follow-up in 1 month  Santana Stamp BSN, CCM [place]  VBCI Population Health RN Care Manager Direct Dial: [phone]  Fax: [phone]

## 2024-01-20 ENCOUNTER — Encounter: Admitting: Physician Assistant

## 2024-01-28 DIAGNOSIS — D649 Anemia, unspecified: Secondary | ICD-10-CM | POA: Diagnosis not present

## 2024-01-28 DIAGNOSIS — N183 Chronic kidney disease, stage 3 unspecified: Secondary | ICD-10-CM | POA: Diagnosis not present

## 2024-01-28 DIAGNOSIS — E559 Vitamin D deficiency, unspecified: Secondary | ICD-10-CM | POA: Diagnosis not present

## 2024-01-28 DIAGNOSIS — Z1329 Encounter for screening for other suspected endocrine disorder: Secondary | ICD-10-CM | POA: Diagnosis not present

## 2024-01-28 DIAGNOSIS — Z13220 Encounter for screening for lipoid disorders: Secondary | ICD-10-CM | POA: Diagnosis not present

## 2024-01-31 DIAGNOSIS — E559 Vitamin D deficiency, unspecified: Secondary | ICD-10-CM | POA: Diagnosis not present

## 2024-02-10 ENCOUNTER — Other Ambulatory Visit: Payer: Self-pay

## 2024-02-10 NOTE — Patient Outreach (Signed)
 Complex Care Management   Visit Note  02/10/2024  Name:  [name] [name] MRN: [id number] DOB: 04-20-1940  Situation: Referral received for Complex Care Management related to HTN.  I obtained verbal consent from Patient.  Visit completed with [name]  on the phone  Background:   Past Medical History:  Diagnosis Date   Anemia    Anxiety    Arthritis    Barrett's esophagus    seen on 2016 egd at [facility]   Depression    GERD (gastroesophageal reflux disease)    H/O hiatal hernia    Headache    Hypertension    Macular degeneration    Obesity    Pneumonia    PONV (postoperative nausea and vomiting)    Seasonal allergies    Sepsis (HCC) 10/02/2013    Assessment: Patient Reported Symptoms:  Cognitive Cognitive Status: No symptoms reported, Alert and oriented to person, place, and time, Insightful and able to interpret abstract concepts, Normal speech and language skills      Neurological Neurological Review of Symptoms: No symptoms reported    HEENT HEENT Symptoms Reported: No symptoms reported      Cardiovascular Cardiovascular Symptoms Reported: Swelling in legs or feet Cardiovascular Comment: Swelling is at baseline, encouraged use of zippered compression stockings. States she will try to don them in the morning when her legs are not so swollen.  Respiratory Respiratory Symptoms Reported: No symptoms reported    Endocrine Endocrine Symptoms Reported: Not assessed Is patient diabetic?: No    Gastrointestinal Gastrointestinal Symptoms Reported: No symptoms reported      Genitourinary Genitourinary Symptoms Reported: Pain/burning with urination Additional Genitourinary Details: Pain/burning with urination is at baseline - has fluconazole  to take as needed.    Integumentary Integumentary Symptoms Reported: Other Other Integumentary Symptoms: Discoloration on bilateral LE.    Musculoskeletal Musculoskelatal Symptoms Reviewed: Unsteady gait Musculoskeletal Comment:  Uses rollator Falls in the past year?: No    Psychosocial Psychosocial Symptoms Reported: No symptoms reported          02/10/2024    PHQ2-9 Depression Screening   Little interest or pleasure in doing things    Feeling down, depressed, or hopeless    PHQ-2 - Total Score    Trouble falling or staying asleep, or sleeping too much    Feeling tired or having little energy    Poor appetite or overeating     Feeling bad about yourself - or that you are a failure or have let yourself or your family down    Trouble concentrating on things, such as reading the newspaper or watching television    Moving or speaking so slowly that other people could have noticed.  Or the opposite - being so fidgety or restless that you have been moving around a lot more than usual    Thoughts that you would be better off dead, or hurting yourself in some way    PHQ2-9 Total Score    If you checked off any problems, how difficult have these problems made it for you to do your work, take care of things at home, or get along with other people    Depression Interventions/Treatment      There were no vitals filed for this visit. Pain Scale: 0-10 Pain Score: 0-No pain  Medications Reviewed Today   Medications were not reviewed in this encounter     Recommendation:   Discussed upcoming appts:  PCP 12/10/285 (educated patient to show PCP her legs to make  sure edema has not worsened).  Patient will try to don zippered compression stockings each morning and taking off at night.   Follow Up Plan:   Telephone follow-up two weeks.  Santana Stamp BSN, CCM [place]  VBCI Population Health RN Care Manager Direct Dial: [phone]  Fax: [phone]

## 2024-02-10 NOTE — Patient Instructions (Signed)
 Visit Information  Thank you for taking time to visit with me today. Please don't hesitate to contact me if I can be of assistance to you before our next scheduled appointment.  Your next care management appointment is by telephone on Monday, December 22nd at 3:00pm.   Please call the care guide team at [phone] if you need to cancel, schedule, or reschedule an appointment.   Please call the [place]  National Suicide Prevention Lifeline: (808)210-0918 or TTY: 782-038-9528 TTY [phone]) to talk to a trained counselor if you are experiencing a Mental Health or Behavioral Health Crisis or need someone to talk to.  Santana Stamp BSN, CCM   VBCI Population Health RN Care Manager Direct Dial: [phone]  Fax: [phone]

## 2024-02-13 DIAGNOSIS — M47812 Spondylosis without myelopathy or radiculopathy, cervical region: Secondary | ICD-10-CM | POA: Diagnosis not present

## 2024-02-13 DIAGNOSIS — M5416 Radiculopathy, lumbar region: Secondary | ICD-10-CM | POA: Diagnosis not present

## 2024-02-24 ENCOUNTER — Other Ambulatory Visit: Payer: Self-pay

## 2024-02-24 NOTE — Patient Instructions (Signed)
 Visit Information  Thank you for taking time to visit with me today. Please don't hesitate to contact me if I can be of assistance to you before our next scheduled appointment.  Your next care management appointment is by telephone on Monday, January 5th  at 2pm.  Please call the care guide team at [phone] if you need to cancel, schedule, or reschedule an appointment.   Please call the [place]  National Suicide Prevention Lifeline: 405-518-0508 or TTY: 986 480 7180 TTY [phone]) to talk to a trained counselor if you are experiencing a Mental Health or Behavioral Health Crisis or need someone to talk to.  Santana Stamp BSN, CCM [place]  VBCI Population Health RN Care Manager Direct Dial: [phone]  Fax: [phone]

## 2024-02-24 NOTE — Patient Outreach (Signed)
 Complex Care Management   Visit Note  02/24/2024  Name:  [name] [name] MRN: [id number] DOB: 12-09-1940  Situation: Referral received for Complex Care Management related to HTN. I obtained verbal consent from Patient.  Visit completed with Ms. Crays  on the phone  Background:   Past Medical History:  Diagnosis Date   Anemia    Anxiety    Arthritis    Barrett's esophagus    seen on 2016 egd at [facility]   Depression    GERD (gastroesophageal reflux disease)    H/O hiatal hernia    Headache    Hypertension    Macular degeneration    Obesity    Pneumonia    PONV (postoperative nausea and vomiting)    Seasonal allergies    Sepsis (HCC) 10/02/2013    Assessment: Patient Reported Symptoms:  Cognitive Cognitive Status: No symptoms reported, Alert and oriented to person, place, and time, Insightful and able to interpret abstract concepts, Normal speech and language skills      Neurological Neurological Review of Symptoms: Not assessed    HEENT HEENT Symptoms Reported: Not assessed      Cardiovascular Patient's Recent BP reading at home: Swelling at baseline, no worse, denies weeping, has an area of pitting edema.  She tried the zippered compression stockings but can't zip them up, the zipper slip.    Respiratory Respiratory Symptoms Reported: No symptoms reported    Endocrine Endocrine Symptoms Reported: Not assessed Is patient diabetic?: No    Gastrointestinal Gastrointestinal Symptoms Reported: No symptoms reported      Genitourinary Genitourinary Symptoms Reported: Not assessed    Integumentary Integumentary Symptoms Reported: Not assessed    Musculoskeletal Musculoskelatal Symptoms Reviewed: Not assessed        Psychosocial Psychosocial Symptoms Reported: Not assessed          02/24/2024    PHQ2-9 Depression Screening   Little interest or pleasure in doing things    Feeling down, depressed, or hopeless    PHQ-2 - Total Score    Trouble falling or  staying asleep, or sleeping too much    Feeling tired or having little energy    Poor appetite or overeating     Feeling bad about yourself - or that you are a failure or have let yourself or your family down    Trouble concentrating on things, such as reading the newspaper or watching television    Moving or speaking so slowly that other people could have noticed.  Or the opposite - being so fidgety or restless that you have been moving around a lot more than usual    Thoughts that you would be better off dead, or hurting yourself in some way    PHQ2-9 Total Score    If you checked off any problems, how difficult have these problems made it for you to do your work, take care of things at home, or get along with other people    Depression Interventions/Treatment      There were no vitals filed for this visit. Pain Scale: 0-10 Pain Score: 0-No pain  MEDICATIONS: Patient denies med concerns, no issues with refills.    Recommendation:   Patient will take BP a few times a week and log, to be discussed on next contact.  She will also purchase compression stockings and don daily due to chronic LE edema ( PCP aware and has assessed).   Follow Up Plan:   Telephone follow-up two weeks.  [employer],  CCM [place]  [facility] Population Health RN Care Manager Direct Dial: [phone]  Fax: [phone]

## 2024-03-09 ENCOUNTER — Telehealth: Payer: Self-pay

## 2024-03-25 ENCOUNTER — Other Ambulatory Visit: Payer: Self-pay

## 2024-03-25 NOTE — Patient Outreach (Signed)
 Complex Care Management   Visit Note  03/26/2024  Name:  [name] [name] MRN: [id number] DOB: Jul 04, 1940  Situation: Referral received for Complex Care Management related to HTN. I obtained verbal consent from Patient.  Visit completed with [name] [name]  on the phone  Background:   Past Medical History:  Diagnosis Date   Anemia    Anxiety    Arthritis    Barrett's esophagus    seen on 2016 egd at [facility]   Depression    GERD (gastroesophageal reflux disease)    H/O hiatal hernia    Headache    Hypertension    Macular degeneration    Obesity    Pneumonia    PONV (postoperative nausea and vomiting)    Seasonal allergies    Sepsis (HCC) 10/02/2013    Assessment: Patient Reported Symptoms:  Cognitive Cognitive Status: Alert and oriented to person, place, and time, Insightful and able to interpret abstract concepts, Normal speech and language skills      Neurological Neurological Review of Symptoms: No symptoms reported    HEENT HEENT Symptoms Reported: Not assessed      Cardiovascular Cardiovascular Symptoms Reported: Swelling in legs or feet Patient's Recent BP reading at home: Bilateral  LE swelling at baseline, Cardiovascular Management Strategies: Medication therapy, Medical device  Respiratory Respiratory Symptoms Reported: No symptoms reported    Endocrine Endocrine Symptoms Reported: No symptoms reported Is patient diabetic?: No    Gastrointestinal Gastrointestinal Symptoms Reported: No symptoms reported      Genitourinary Additional Genitourinary Details: Pain/burning with urination is at baseline - has fluconazole  and AZO to take as needed, uses Premarin  daily as ordered. Genitourinary Self-Management Outcome: 3 (uncertain)  Integumentary Integumentary Symptoms Reported: Not assessed    Musculoskeletal Musculoskelatal Symptoms Reviewed: Not assessed        Psychosocial Psychosocial Symptoms Reported: Not assessed          03/26/2024    PHQ2-9  Depression Screening   Little interest or pleasure in doing things    Feeling down, depressed, or hopeless    PHQ-2 - Total Score    Trouble falling or staying asleep, or sleeping too much    Feeling tired or having little energy    Poor appetite or overeating     Feeling bad about yourself - or that you are a failure or have let yourself or your family down    Trouble concentrating on things, such as reading the newspaper or watching television    Moving or speaking so slowly that other people could have noticed.  Or the opposite - being so fidgety or restless that you have been moving around a lot more than usual    Thoughts that you would be better off dead, or hurting yourself in some way    PHQ2-9 Total Score    If you checked off any problems, how difficult have these problems made it for you to do your work, take care of things at home, or get along with other people    Depression Interventions/Treatment      There were no vitals filed for this visit. Pain Scale: 0-10 Pain Score: 0-No pain  Medications Reviewed Today   Medications were not reviewed in this encounter     Recommendation:   Specialty Provider follow up: Otolaryngology 04/03/24  Follow Up Plan:   Telephone follow-up in 1 month  Santana Stamp BSN, CCM [place]  [facility] Population Health RN Care Manager Direct Dial: [phone]  Fax: [phone]

## 2024-03-26 NOTE — Patient Instructions (Signed)
 Visit Information  Thank you for taking time to visit with me today. Please don't hesitate to contact me if I can be of assistance to you before our next scheduled appointment.  Your next care management appointment is by telephone on Wednesday, February 18th at 1:00pm.  Please call the care guide team at [phone] if you need to cancel, schedule, or reschedule an appointment.   Please call the [place]  National Suicide Prevention Lifeline: 223-121-7690 or TTY: 408-737-6012 TTY (301)008-5995) to talk to a trained counselor if you are experiencing a Mental Health or Behavioral Health Crisis or need someone to talk to.  Santana Stamp BSN, CCM [place]  [facility] Population Health RN Care Manager Direct Dial: [phone]  Fax: [phone]

## 2024-04-09 ENCOUNTER — Ambulatory Visit: Admitting: Orthopedic Surgery

## 2024-04-16 ENCOUNTER — Ambulatory Visit: Admitting: Orthopedic Surgery

## 2024-04-22 ENCOUNTER — Telehealth
